# Patient Record
Sex: Male | Born: 1950
Health system: Southern US, Community
[De-identification: ages and names within clinical notes are randomized; demographics above are authoritative.]

## PROBLEM LIST (undated history)

## (undated) DIAGNOSIS — I639 Cerebral infarction, unspecified: Secondary | ICD-10-CM

## (undated) DIAGNOSIS — R351 Nocturia: Secondary | ICD-10-CM

## (undated) DIAGNOSIS — Z8719 Personal history of other diseases of the digestive system: Secondary | ICD-10-CM

## (undated) DIAGNOSIS — K264 Chronic or unspecified duodenal ulcer with hemorrhage: Secondary | ICD-10-CM

## (undated) DIAGNOSIS — E079 Disorder of thyroid, unspecified: Secondary | ICD-10-CM

## (undated) DIAGNOSIS — G4730 Sleep apnea, unspecified: Secondary | ICD-10-CM

## (undated) DIAGNOSIS — N189 Chronic kidney disease, unspecified: Secondary | ICD-10-CM

## (undated) DIAGNOSIS — Z860100 Personal history of colon polyps, unspecified: Secondary | ICD-10-CM

## (undated) DIAGNOSIS — R011 Cardiac murmur, unspecified: Secondary | ICD-10-CM

## (undated) DIAGNOSIS — M1990 Unspecified osteoarthritis, unspecified site: Secondary | ICD-10-CM

## (undated) DIAGNOSIS — K219 Gastro-esophageal reflux disease without esophagitis: Secondary | ICD-10-CM

## (undated) DIAGNOSIS — R0600 Dyspnea, unspecified: Secondary | ICD-10-CM

## (undated) DIAGNOSIS — Z9289 Personal history of other medical treatment: Secondary | ICD-10-CM

## (undated) DIAGNOSIS — R350 Frequency of micturition: Secondary | ICD-10-CM

## (undated) DIAGNOSIS — K922 Gastrointestinal hemorrhage, unspecified: Secondary | ICD-10-CM

## (undated) DIAGNOSIS — M549 Dorsalgia, unspecified: Secondary | ICD-10-CM

## (undated) DIAGNOSIS — Z87442 Personal history of urinary calculi: Secondary | ICD-10-CM

## (undated) DIAGNOSIS — D62 Acute posthemorrhagic anemia: Secondary | ICD-10-CM

## (undated) DIAGNOSIS — G8929 Other chronic pain: Secondary | ICD-10-CM

## (undated) DIAGNOSIS — R7989 Other specified abnormal findings of blood chemistry: Secondary | ICD-10-CM

## (undated) DIAGNOSIS — K921 Melena: Secondary | ICD-10-CM

## (undated) DIAGNOSIS — Z8616 Personal history of COVID-19: Secondary | ICD-10-CM

## (undated) DIAGNOSIS — R609 Edema, unspecified: Secondary | ICD-10-CM

## (undated) DIAGNOSIS — R269 Unspecified abnormalities of gait and mobility: Secondary | ICD-10-CM

## (undated) DIAGNOSIS — D638 Anemia in other chronic diseases classified elsewhere: Secondary | ICD-10-CM

## (undated) DIAGNOSIS — E785 Hyperlipidemia, unspecified: Secondary | ICD-10-CM

## (undated) DIAGNOSIS — I10 Essential (primary) hypertension: Secondary | ICD-10-CM

## (undated) DIAGNOSIS — I509 Heart failure, unspecified: Secondary | ICD-10-CM

## (undated) DIAGNOSIS — J1282 Pneumonia due to coronavirus disease 2019: Secondary | ICD-10-CM

## (undated) DIAGNOSIS — I259 Chronic ischemic heart disease, unspecified: Secondary | ICD-10-CM

## (undated) DIAGNOSIS — E669 Obesity, unspecified: Secondary | ICD-10-CM

## (undated) DIAGNOSIS — R600 Localized edema: Secondary | ICD-10-CM

## (undated) DIAGNOSIS — U071 COVID-19: Secondary | ICD-10-CM

## (undated) HISTORY — DX: COVID-19: U07.1

## (undated) HISTORY — PX: ESOPHAGOGASTRODUODENOSCOPY: SHX1529

## (undated) HISTORY — DX: Personal history of other medical treatment: Z92.89

## (undated) HISTORY — PX: OTHER SURGICAL HISTORY: SHX169

## (undated) HISTORY — DX: Obesity, unspecified: E66.9

## (undated) HISTORY — DX: Heart failure, unspecified: I50.9

## (undated) HISTORY — DX: Other specified abnormal findings of blood chemistry: R79.89

## (undated) HISTORY — DX: Chronic or unspecified duodenal ulcer with hemorrhage: K26.4

## (undated) HISTORY — DX: Acute posthemorrhagic anemia: D62

## (undated) HISTORY — PX: NEUROPLASTY / TRANSPOSITION MEDIAN NERVE AT CARPAL TUNNEL BILATERAL: SUR894

## (undated) HISTORY — DX: Melena: K92.1

## (undated) HISTORY — DX: Personal history of COVID-19: Z86.16

## (undated) HISTORY — PX: HEMORRHOID SURGERY: SHX153

## (undated) HISTORY — DX: Disorder of thyroid, unspecified: E07.9

## (undated) HISTORY — DX: Cerebral infarction, unspecified: I63.9

## (undated) HISTORY — DX: Chronic ischemic heart disease, unspecified: I25.9

## (undated) HISTORY — DX: Pneumonia due to coronavirus disease 2019: J12.82

## (undated) HISTORY — DX: Chronic kidney disease, unspecified: N18.9

## (undated) HISTORY — PX: COLONOSCOPY: SHX174

## (undated) HISTORY — DX: Anemia in other chronic diseases classified elsewhere: D63.8

## (undated) HISTORY — PX: NEPHRECTOMY TRANSPLANTED ORGAN: SUR880

## (undated) HISTORY — DX: Unspecified abnormalities of gait and mobility: R26.9

---

## 1999-09-16 ENCOUNTER — Encounter: Admission: RE | Admit: 1999-09-16 | Discharge: 1999-12-15 | Payer: Self-pay | Admitting: Anesthesiology

## 1999-12-16 ENCOUNTER — Ambulatory Visit (HOSPITAL_COMMUNITY): Admission: RE | Admit: 1999-12-16 | Discharge: 1999-12-16 | Payer: Self-pay | Admitting: Neurosurgery

## 2000-06-02 ENCOUNTER — Ambulatory Visit (HOSPITAL_COMMUNITY): Admission: RE | Admit: 2000-06-02 | Discharge: 2000-06-02 | Payer: Self-pay | Admitting: Neurosurgery

## 2000-08-23 ENCOUNTER — Inpatient Hospital Stay (HOSPITAL_COMMUNITY): Admission: RE | Admit: 2000-08-23 | Discharge: 2000-08-24 | Payer: Self-pay | Admitting: Neurosurgery

## 2000-10-27 ENCOUNTER — Encounter: Admission: RE | Admit: 2000-10-27 | Discharge: 2001-01-08 | Payer: Self-pay | Admitting: Neurosurgery

## 2001-04-23 ENCOUNTER — Inpatient Hospital Stay (HOSPITAL_COMMUNITY): Admission: RE | Admit: 2001-04-23 | Discharge: 2001-04-26 | Payer: Self-pay | Admitting: Neurosurgery

## 2006-01-19 ENCOUNTER — Emergency Department (HOSPITAL_COMMUNITY): Admission: EM | Admit: 2006-01-19 | Discharge: 2006-01-19 | Payer: Self-pay | Admitting: Emergency Medicine

## 2011-01-22 ENCOUNTER — Inpatient Hospital Stay (HOSPITAL_COMMUNITY)
Admission: EM | Admit: 2011-01-22 | Discharge: 2011-01-27 | DRG: 552 | Disposition: A | Discharge status: Home / Self Care | Payer: Medicare Other | Attending: Internal Medicine | Admitting: Internal Medicine

## 2011-01-22 ENCOUNTER — Encounter (HOSPITAL_COMMUNITY): Payer: Self-pay | Admitting: *Deleted

## 2011-01-22 DIAGNOSIS — E119 Type 2 diabetes mellitus without complications: Secondary | ICD-10-CM | POA: Diagnosis present

## 2011-01-22 DIAGNOSIS — S335XXA Sprain of ligaments of lumbar spine, initial encounter: Principal | ICD-10-CM | POA: Diagnosis present

## 2011-01-22 DIAGNOSIS — Z794 Long term (current) use of insulin: Secondary | ICD-10-CM

## 2011-01-22 DIAGNOSIS — T46905A Adverse effect of unspecified agents primarily affecting the cardiovascular system, initial encounter: Secondary | ICD-10-CM | POA: Diagnosis present

## 2011-01-22 DIAGNOSIS — Z888 Allergy status to other drugs, medicaments and biological substances status: Secondary | ICD-10-CM

## 2011-01-22 DIAGNOSIS — X58XXXA Exposure to other specified factors, initial encounter: Secondary | ICD-10-CM

## 2011-01-22 DIAGNOSIS — N179 Acute kidney failure, unspecified: Secondary | ICD-10-CM | POA: Diagnosis present

## 2011-01-22 DIAGNOSIS — Z23 Encounter for immunization: Secondary | ICD-10-CM

## 2011-01-22 DIAGNOSIS — F17200 Nicotine dependence, unspecified, uncomplicated: Secondary | ICD-10-CM | POA: Diagnosis present

## 2011-01-22 DIAGNOSIS — Z981 Arthrodesis status: Secondary | ICD-10-CM

## 2011-01-22 DIAGNOSIS — M545 Low back pain, unspecified: Secondary | ICD-10-CM | POA: Diagnosis present

## 2011-01-22 DIAGNOSIS — I10 Essential (primary) hypertension: Secondary | ICD-10-CM | POA: Diagnosis present

## 2011-01-22 DIAGNOSIS — N189 Chronic kidney disease, unspecified: Secondary | ICD-10-CM | POA: Diagnosis present

## 2011-01-22 DIAGNOSIS — E669 Obesity, unspecified: Secondary | ICD-10-CM | POA: Diagnosis present

## 2011-01-22 DIAGNOSIS — Z79899 Other long term (current) drug therapy: Secondary | ICD-10-CM

## 2011-01-22 DIAGNOSIS — E875 Hyperkalemia: Secondary | ICD-10-CM | POA: Diagnosis present

## 2011-01-22 DIAGNOSIS — M48061 Spinal stenosis, lumbar region without neurogenic claudication: Secondary | ICD-10-CM | POA: Diagnosis present

## 2011-01-22 DIAGNOSIS — M549 Dorsalgia, unspecified: Secondary | ICD-10-CM | POA: Diagnosis present

## 2011-01-22 DIAGNOSIS — G8929 Other chronic pain: Secondary | ICD-10-CM | POA: Diagnosis present

## 2011-01-22 DIAGNOSIS — M5380 Other specified dorsopathies, site unspecified: Secondary | ICD-10-CM | POA: Diagnosis present

## 2011-01-22 DIAGNOSIS — I129 Hypertensive chronic kidney disease with stage 1 through stage 4 chronic kidney disease, or unspecified chronic kidney disease: Secondary | ICD-10-CM | POA: Diagnosis present

## 2011-01-22 DIAGNOSIS — Z940 Kidney transplant status: Secondary | ICD-10-CM

## 2011-01-22 HISTORY — DX: Essential (primary) hypertension: I10

## 2011-01-22 LAB — BASIC METABOLIC PANEL
CO2: 20 mEq/L (ref 19–32)
Calcium: 9.3 mg/dL (ref 8.4–10.5)
Creatinine, Ser: 1.87 mg/dL — ABNORMAL HIGH (ref 0.50–1.35)
Glucose, Bld: 109 mg/dL — ABNORMAL HIGH (ref 70–99)

## 2011-01-22 LAB — CBC
Hemoglobin: 13.1 g/dL (ref 13.0–17.0)
MCH: 30.6 pg (ref 26.0–34.0)
MCV: 88.8 fL (ref 78.0–100.0)
RBC: 4.28 MIL/uL (ref 4.22–5.81)

## 2011-01-22 MED ORDER — DEXTROSE 50 % IV SOLN
25.0000 g | Freq: Once | INTRAVENOUS | Status: AC
  Administered 2011-01-23: 25 g via INTRAVENOUS
  Filled 2011-01-22: qty 50

## 2011-01-22 MED ORDER — HYDROMORPHONE HCL PF 1 MG/ML IJ SOLN
1.0000 mg | INTRAMUSCULAR | Status: DC | PRN
  Administered 2011-01-23 (×5): 1 mg via INTRAVENOUS
  Filled 2011-01-22 (×8): qty 1

## 2011-01-22 MED ORDER — SODIUM CHLORIDE 0.9 % IV SOLN
Freq: Once | INTRAVENOUS | Status: AC
  Administered 2011-01-22: via INTRAVENOUS

## 2011-01-22 MED ORDER — DEXTROSE 5 % IV SOLN
INTRAVENOUS | Status: DC
  Administered 2011-01-23: 04:00:00 via INTRAVENOUS
  Filled 2011-01-22 (×5): qty 150

## 2011-01-22 MED ORDER — HYDROMORPHONE HCL PF 1 MG/ML IJ SOLN
1.0000 mg | Freq: Once | INTRAMUSCULAR | Status: AC
  Administered 2011-01-22: 1 mg via INTRAVENOUS
  Filled 2011-01-22: qty 1

## 2011-01-22 MED ORDER — DIAZEPAM 5 MG/ML IJ SOLN: 5.0000 mg | Freq: Four times a day (QID) | INTRAMUSCULAR | Status: DC | PRN

## 2011-01-22 MED ORDER — ONDANSETRON 4 MG PO TBDP
ORAL_TABLET | ORAL | Status: AC
  Administered 2011-01-22: 20:00:00
  Filled 2011-01-22: qty 1

## 2011-01-22 MED ORDER — SODIUM CHLORIDE 0.9 % IV SOLN
1.0000 g | Freq: Once | INTRAVENOUS | Status: AC
  Administered 2011-01-23: 1 g via INTRAVENOUS
  Filled 2011-01-22: qty 10

## 2011-01-22 MED ORDER — ONDANSETRON HCL 4 MG/2ML IJ SOLN: 4.0000 mg | Freq: Four times a day (QID) | INTRAMUSCULAR | Status: DC | PRN

## 2011-01-22 MED ORDER — INSULIN REGULAR HUMAN 100 UNIT/ML IJ SOLN: 10.0000 [IU] | Freq: Once | INTRAMUSCULAR | Status: DC

## 2011-01-22 MED ORDER — OXYCODONE-ACETAMINOPHEN 5-325 MG PO TABS
1.0000 | ORAL_TABLET | Freq: Four times a day (QID) | ORAL | Status: DC | PRN
  Administered 2011-01-25 – 2011-01-26 (×5): 1 via ORAL
  Administered 2011-01-26: 2 via ORAL
  Administered 2011-01-27 (×2): 1 via ORAL
  Filled 2011-01-22 (×6): qty 1
  Filled 2011-01-22: qty 2
  Filled 2011-01-22: qty 1

## 2011-01-22 MED ORDER — INSULIN ASPART 100 UNIT/ML IV SOLN
10.0000 [IU] | Freq: Once | INTRAVENOUS | Status: DC
  Filled 2011-01-22: qty 0.1

## 2011-01-22 NOTE — ED Notes (Signed)
Pt very nauseated from pain, actively vomiting

## 2011-01-22 NOTE — ED Notes (Signed)
He has pain in his lower back since Tuesday.  He may have injured his back cutting wood.  He has chronic back pain.  The pain is worse

## 2011-01-22 NOTE — ED Notes (Signed)
Patient states pain 0/10. Walked from stretcher to bed with minimal assistance. Gait steady

## 2011-01-22 NOTE — ED Provider Notes (Signed)
History     CSN: [id number]  Arrival date & time 01/22/11  [id number]   First MD Initiated Contact with Patient 01/22/11 2053      Chief Complaint  Patient presents with  . Back Pain    (Consider location/radiation/quality/duration/timing/severity/associated sxs/prior treatment) HPI Comments: Patient with hx L-spine surgery states that he was splitting wood 4 days ago and strained his back bending over to pick up wood.  The next day, he twisted his back and heard and felt a pop in his low back.  Patient has since had increased low back pain with radiation into both legs.  States he also is having weakness in both legs.  States he has not had pain that has been this bad ever, not even before his prior spinal surgery.  Patient has been taking hydrocodone-APAP 10-325s without any improvement.  Today patient began vomiting from the pain and has been unable to tolerate any PO. Pain is worse with walking, bending, and certain positions.  It is better with laying flat, which is what patient has been doing for several days.  He has also started walking with a cane, which he has not needed previously.  Denies numbness or tingling of the extremities.  Denies fever, abdominal pain, loss of control of bowel or bladder.    Patient is a 61 y.o. male presenting with back pain. The history is provided by the patient and the spouse.  Back Pain  This is a recurrent problem. The current episode started more than 2 days ago. The problem occurs constantly. The pain is associated with a recent injury, lifting heavy objects and twisting. The pain is present in the lumbar spine. The quality of the pain is described as stabbing and shooting. Radiates to: bilateral lower extremities. The symptoms are aggravated by bending, twisting and certain positions. Associated symptoms include leg pain and weakness. Pertinent negatives include no chest pain, no fever, no abdominal pain, no bowel incontinence, no perianal numbness, no  bladder incontinence, no paresthesias, no paresis and no tingling. He has tried analgesics for the symptoms. The treatment provided no relief. Risk factors include obesity and lack of exercise (Prior injury/L-spine surgery).    Past Medical History  Diagnosis Date  . Diabetes mellitus   . Hypertension     History reviewed. No pertinent past surgical history.  History reviewed. No pertinent family history.  History  Substance Use Topics  . Smoking status: Current Everyday Smoker  . Smokeless tobacco: Not on file  . Alcohol Use: Yes      Review of Systems  Constitutional: Negative for fever.  Respiratory: Negative for shortness of breath.   Cardiovascular: Negative for chest pain.  Gastrointestinal: Positive for nausea and vomiting. Negative for abdominal pain, diarrhea, constipation and bowel incontinence.  Genitourinary: Negative for bladder incontinence.  Musculoskeletal: Positive for back pain.  Neurological: Positive for weakness. Negative for tingling and paresthesias.  All other systems reviewed and are negative.    Allergies  Valstar  Home Medications   Current Outpatient Rx  Name Route Sig Dispense Refill  . HYDROCODONE-ACETAMINOPHEN 10-325 MG PO TABS Oral Take 1 tablet by mouth every 5 (five) hours as needed. For pain    . INSULIN GLARGINE 100 UNIT/ML [place] SOLN Subcutaneous Inject 40 Units into the skin at bedtime.    [name] LISINOPRIL 5 MG PO TABS Oral Take 2.5 mg by mouth daily.    [name] LOVASTATIN 20 MG PO TABS Oral Take 20 mg by mouth daily.    [name]  METFORMIN HCL 1000 MG PO TABS Oral Take 1,000 mg by mouth 2 (two) times daily with a meal.    . NAPROXEN SODIUM 220 MG PO TABS Oral Take 220 mg by mouth daily as needed. For fever    . PROMETHAZINE HCL 25 MG PO TABS Oral Take 25 mg by mouth every 6 (six) hours as needed. For nausea      BP 182/68  Pulse 62  Temp(Src) 98.1 F (36.7 C) (Oral)  Resp 18  SpO2 97%  Physical Exam  Nursing note and vitals  reviewed. Constitutional: He is oriented to person, place, and time. He appears well-developed and well-nourished.  HENT:  Head: Normocephalic and atraumatic.  Neck: Neck supple.  Cardiovascular: Normal rate, regular rhythm and normal heart sounds.   Pulmonary/Chest: Breath sounds normal. No respiratory distress. He has no wheezes. He has no rales. He exhibits no tenderness.  Abdominal: Soft. Bowel sounds are normal. He exhibits no distension and no mass. There is no tenderness. There is no rebound and no guarding.  Musculoskeletal:       Arms:      Straight leg raise is positive.  Neurological: He is alert and oriented to person, place, and time. No sensory deficit.       Strength 5/5 right leg, 4/5 left leg.  Sensation intact.  Distal pulses intact.  Patellar reflexes intact bilaterally.     ED Course  Procedures (including critical care time)  Labs Reviewed  CBC - Abnormal; Notable for the following:    WBC 12.2 (*)    HCT 38.0 (*)    All other components within normal limits  BASIC METABOLIC PANEL - Abnormal; Notable for the following:    Potassium 6.9 (*)    Glucose, Bld 109 (*)    BUN 30 (*)    Creatinine, Ser 1.87 (*)    GFR calc non Af Amer 38 (*)    GFR calc Af Amer 44 (*)    All other components within normal limits  GLUCOSE, CAPILLARY   No results found.  9:26 PM Patient seen and examined, discussed with Dr Lita Mains.  Patient placed on back pain protocol overnight with MRI in the morning.    11:50 PM Patient found to be hyperkalemic at 6.9.  I have discussed this with Dr Lita Mains who has reviewed the EKG and has seen the patient.  Patient is comfortable and sleeping following pain medication.  Plan per Dr Lita Mains is to treat hyperkalemia with calcium, insulin and glucose, and sodium bicarbonate and to recheck the potassium.  If the potassium level is not under 6 when rechecked, patient will need to be admitted to the hospital.    No diagnosis found.    MDM   Patient with exacerbation of low back pain in CDU on back pain protocol with MR planned for the morning.  Patient incidentally found to be hyperkalemic.  We are treating the hyperkalemia and rechecking potassium.  If potassium is under 6, patient will continue on protocol.  If it is higher, patient to be admitted.  Dr Stark Jock is aware of patient and assumes care of him at change of shift.     Otis Brace, [place] 01/23/11 0008   Date: 01/23/2011  Rate: 61  Rhythm: normal sinus rhythm  QRS Axis: normal  Intervals: normal  ST/T Wave abnormalities: peaked T wave in V3 and V4  Conduction Disutrbances:none  Narrative Interpretation:   Old EKG Reviewed: none available    Otis Brace,  [place] 01/23/11 0012

## 2011-01-23 ENCOUNTER — Inpatient Hospital Stay (HOSPITAL_COMMUNITY): Payer: Medicare Other

## 2011-01-23 DIAGNOSIS — E119 Type 2 diabetes mellitus without complications: Secondary | ICD-10-CM

## 2011-01-23 DIAGNOSIS — M545 Low back pain, unspecified: Secondary | ICD-10-CM | POA: Diagnosis present

## 2011-01-23 DIAGNOSIS — E875 Hyperkalemia: Secondary | ICD-10-CM

## 2011-01-23 HISTORY — DX: Type 2 diabetes mellitus without complications: E11.9

## 2011-01-23 HISTORY — DX: Hyperkalemia: E87.5

## 2011-01-23 LAB — GLUCOSE, CAPILLARY
Glucose-Capillary: 91 mg/dL (ref 70–99)
Glucose-Capillary: 105 mg/dL — ABNORMAL HIGH (ref 70–99)

## 2011-01-23 LAB — HEMOGLOBIN A1C: Mean Plasma Glucose: 111 mg/dL (ref <117)

## 2011-01-23 LAB — BASIC METABOLIC PANEL
BUN: 37 mg/dL — ABNORMAL HIGH (ref 6–23)
CO2: 24 mEq/L (ref 19–32)
Calcium: 8.9 mg/dL (ref 8.4–10.5)
Creatinine, Ser: 2.09 mg/dL — ABNORMAL HIGH (ref 0.50–1.35)
Glucose, Bld: 175 mg/dL — ABNORMAL HIGH (ref 70–99)

## 2011-01-23 LAB — URINE MICROSCOPIC-ADD ON

## 2011-01-23 LAB — URINALYSIS, ROUTINE W REFLEX MICROSCOPIC
Bilirubin Urine: NEGATIVE
Ketones, ur: NEGATIVE mg/dL
Nitrite: NEGATIVE
Urobilinogen, UA: 0.2 mg/dL (ref 0.0–1.0)
pH: 5 (ref 5.0–8.0)

## 2011-01-23 LAB — POTASSIUM: Potassium: 5.3 mEq/L — ABNORMAL HIGH (ref 3.5–5.1)

## 2011-01-23 LAB — CK: Total CK: 58 U/L (ref 7–232)

## 2011-01-23 MED ORDER — SODIUM POLYSTYRENE SULFONATE 15 GM/60ML PO SUSP: 15.0000 g | Freq: Once | ORAL | Status: DC

## 2011-01-23 MED ORDER — HYDRALAZINE HCL 20 MG/ML IJ SOLN
10.0000 mg | Freq: Four times a day (QID) | INTRAMUSCULAR | Status: DC | PRN
  Administered 2011-01-24 – 2011-01-25 (×2): 10 mg via INTRAVENOUS
  Filled 2011-01-23 (×2): qty 1

## 2011-01-23 MED ORDER — ACETAMINOPHEN 650 MG RE SUPP: 650.0000 mg | Freq: Four times a day (QID) | RECTAL | Status: DC | PRN

## 2011-01-23 MED ORDER — ACETAMINOPHEN 325 MG PO TABS: 650.0000 mg | ORAL_TABLET | Freq: Four times a day (QID) | ORAL | Status: DC | PRN

## 2011-01-23 MED ORDER — SODIUM CHLORIDE 0.9 % IV SOLN
INTRAVENOUS | Status: DC
  Administered 2011-01-23 – 2011-01-25 (×3): via INTRAVENOUS

## 2011-01-23 MED ORDER — INSULIN ASPART 100 UNIT/ML ~~LOC~~ SOLN: 0.0000 [IU] | Freq: Every day | SUBCUTANEOUS | Status: DC

## 2011-01-23 MED ORDER — SODIUM POLYSTYRENE SULFONATE 15 GM/60ML PO SUSP
30.0000 g | Freq: Once | ORAL | Status: AC
  Administered 2011-01-23: 30 g via ORAL
  Filled 2011-01-23: qty 120

## 2011-01-23 MED ORDER — INSULIN ASPART 100 UNIT/ML ~~LOC~~ SOLN
SUBCUTANEOUS | Status: AC
  Administered 2011-01-23: 10 [IU]
  Filled 2011-01-23: qty 10

## 2011-01-23 MED ORDER — INFLUENZA VIRUS VACC SPLIT PF IM SUSP
0.5000 mL | INTRAMUSCULAR | Status: AC
  Filled 2011-01-23: qty 0.5

## 2011-01-23 MED ORDER — SODIUM BICARBONATE 8.4 % IV SOLN
INTRAVENOUS | Status: AC
Start: 2011-01-23 — End: 2011-01-23
  Administered 2011-01-23: 50 meq
  Filled 2011-01-23: qty 50

## 2011-01-23 MED ORDER — ONDANSETRON HCL 4 MG PO TABS: 4.0000 mg | ORAL_TABLET | Freq: Four times a day (QID) | ORAL | Status: DC | PRN

## 2011-01-23 MED ORDER — INSULIN GLARGINE 100 UNIT/ML ~~LOC~~ SOLN
40.0000 [IU] | Freq: Every day | SUBCUTANEOUS | Status: DC
  Administered 2011-01-23 – 2011-01-26 (×4): 40 [IU] via SUBCUTANEOUS
  Filled 2011-01-23 (×2): qty 3

## 2011-01-23 MED ORDER — SODIUM CHLORIDE 0.9 % IV SOLN: INTRAVENOUS | Status: AC

## 2011-01-23 MED ORDER — KETOROLAC TROMETHAMINE 30 MG/ML IJ SOLN
30.0000 mg | Freq: Once | INTRAMUSCULAR | Status: AC
  Administered 2011-01-23: 30 mg via INTRAVENOUS
  Filled 2011-01-23: qty 1

## 2011-01-23 MED ORDER — HYDROMORPHONE HCL PF 1 MG/ML IJ SOLN
0.5000 mg | INTRAMUSCULAR | Status: DC | PRN
  Administered 2011-01-24 (×3): 1 mg via INTRAVENOUS

## 2011-01-23 MED ORDER — ZOLPIDEM TARTRATE 5 MG PO TABS: 5.0000 mg | ORAL_TABLET | Freq: Every evening | ORAL | Status: DC | PRN

## 2011-01-23 MED ORDER — OXYCODONE HCL 5 MG PO TABS
5.0000 mg | ORAL_TABLET | ORAL | Status: DC | PRN
  Administered 2011-01-24 – 2011-01-25 (×2): 5 mg via ORAL
  Filled 2011-01-23 (×2): qty 1

## 2011-01-23 MED ORDER — ONDANSETRON HCL 4 MG/2ML IJ SOLN: 4.0000 mg | Freq: Four times a day (QID) | INTRAMUSCULAR | Status: DC | PRN

## 2011-01-23 MED ORDER — ENOXAPARIN SODIUM 40 MG/0.4ML ~~LOC~~ SOLN
40.0000 mg | SUBCUTANEOUS | Status: DC
  Administered 2011-01-23 – 2011-01-27 (×5): 40 mg via SUBCUTANEOUS
  Filled 2011-01-23 (×5): qty 0.4

## 2011-01-23 MED ORDER — CALCIUM GLUCONATE 10 % IV SOLN
INTRAVENOUS | Status: AC
Start: 2011-01-23 — End: 2011-01-23
  Filled 2011-01-23: qty 10

## 2011-01-23 MED ORDER — INSULIN ASPART 100 UNIT/ML ~~LOC~~ SOLN
0.0000 [IU] | Freq: Three times a day (TID) | SUBCUTANEOUS | Status: DC
  Filled 2011-01-23 (×2): qty 3

## 2011-01-23 MED ORDER — SODIUM CHLORIDE 0.9 % IV SOLN
INTRAVENOUS | Status: AC
  Filled 2011-01-23: qty 100

## 2011-01-23 MED ORDER — PROMETHAZINE HCL 25 MG PO TABS: 25.0000 mg | ORAL_TABLET | Freq: Four times a day (QID) | ORAL | Status: DC | PRN

## 2011-01-23 MED ORDER — PNEUMOCOCCAL VAC POLYVALENT 25 MCG/0.5ML IJ INJ
0.5000 mL | INJECTION | INTRAMUSCULAR | Status: AC
  Filled 2011-01-23: qty 0.5

## 2011-01-23 NOTE — H&P (Addendum)
DATE OF ADMISSION:  01/23/2011  PCP:  Dr. Rex Kras    Chief Complaint:  Low Back Pain   HPI: Jon [name] is an 61 y.o. male who was splitting wood 5 days ago and suffered increased 10/10 low back pain which worsened when he twisted his back while using the toilet.  He had increased pain and had difficulty moving.  He had contacted his neurosurgeon who had performed his lumbar fusion and was in the process of getting approval by his insurance to get an MRI, but he had increased pain on 01/12 and was advised to come to the ED for pain control and further evaluation.  Patient was seen in the ED and placed on the Back Pain Protocol and was to have an MRI in the AM, but was found to have an elevated potassium level of 6.9 when his baseline labs were drawn, so a repeat potassium level was performed and found to be 7.1.  Patient was started on therapy by the EDP, and referred for medical admission.  Patient denies taking any poatssium supplements, bu the does take an ace inhibitor and is on lisinopril 5mg  and has been on this for many years.  He has a history of type 2 Diabetes.     Past Medical History  Diagnosis Date  . Diabetes mellitus   . Hypertension     Past Surgical History:       Lumbar Fusion L 3,4,5       Arthoscopic Surgery Left Knee       ORIF Right Femur   Medications:  HOME MEDS: Prior to Admission medications   Medication Sig Start Date End Date Taking? Authorizing Provider  HYDROcodone-acetaminophen (NORCO) 10-325 MG per tablet Take 1 tablet by mouth every 5 (five) hours as needed. For pain   Yes Historical Provider, MD  insulin glargine (LANTUS) 100 UNIT/ML injection Inject 40 Units into the skin at bedtime.   Yes Historical Provider, MD  lisinopril (PRINIVIL,ZESTRIL) 5 MG tablet Take 2.5 mg by mouth daily.   Yes Historical Provider, MD  lovastatin (MEVACOR) 20 MG tablet Take 20 mg by mouth daily.   Yes Historical Provider, MD  metFORMIN (GLUCOPHAGE) 1000 MG tablet Take  1,000 mg by mouth 2 (two) times daily with a meal.   Yes Historical Provider, MD  naproxen sodium (ANAPROX) 220 MG tablet Take 220 mg by mouth daily as needed. For fever   Yes Historical Provider, MD  promethazine (PHENERGAN) 25 MG tablet Take 25 mg by mouth every 6 (six) hours as needed. For nausea   Yes Historical Provider, MD    Allergies:  Allergies  Allergen Reactions  . Valstar Nausea And Vomiting    Social History:   reports that he has been smoking.  He does not have any smokeless tobacco history on file. He reports that he drinks alcohol. His drug history not on file.  Smokes 1ppd, Rare ETOH, No Illicit Drug Usage.    Family History: History reviewed. No pertinent family history.  Review of Systems:  The patient denies anorexia, fever, weight loss, vision loss, decreased hearing, hoarseness, chest pain, syncope, dyspnea on exertion, peripheral edema, balance deficits, hemoptysis, abdominal pain, melena, hematochezia, severe indigestion/heartburn, hematuria, incontinence, genital sores, muscle weakness, suspicious skin lesions, transient blindness, difficulty walking, depression, unusual weight change, abnormal bleeding, enlarged lymph nodes, angioedema, and breast masses.   Physical Exam:  GEN:  Pleasant 61 year old Caucasian male examined  and in discomfort but no acute distress; cooperative with  exam Filed Vitals:   01/22/11 1901 01/22/11 2130 01/23/11 0145  BP: 182/68 153/82 123/67  Pulse: 62    Temp: 98.1 F (36.7 C) 98.3 F (36.8 C)   TempSrc: Oral Oral   Resp: 18 18 16   SpO2: 97% 95% 96%   Blood pressure 123/67, pulse 62, temperature 98.3 F (36.8 C), temperature source Oral, resp. rate 16, SpO2 96.00%. PSYCH: He is alert and oriented x4; does not appear anxious does not appear depressed; affect is normal HEENT: Normocephalic and Atraumatic, Mucous membranes pink; PERRLA; EOM intact; Fundi:  Benign;  No scleral icterus, Nares: Patent, Oropharynx: Clear, Edentulous   With Dentures, Neck:  FROM, no cervical lymphadenopathy nor thyromegaly or carotid bruit; no JVD; Breasts:: Not examined CHEST WALL: No tenderness CHEST: Normal respiration, clear to auscultation bilaterally HEART: Regular rate and rhythm; no murmurs rubs or gallops BACK: No kyphosis or scoliosis; no CVA tenderness ABDOMEN: Positive Bowel Sounds, Obese, soft non-tender; no masses, no organomegaly. Rectal Exam: Not done EXTREMITIES: No cyanosis, clubbing or edema; no ulcerations. Genitalia: not examined PULSES: 2+ and symmetric SKIN: Normal hydration no rash or ulceration CNS: Cranial nerves 2-12 grossly intact no focal neurologic deficit   Labs & Imaging Results for orders placed during the hospital encounter of 01/22/11 (from the past 48 hour(s))  CBC     Status: Abnormal   Collection Time   01/22/11  9:29 PM      Component Value Range Comment   WBC 12.2 (*) 4.0 - 10.5 (K/uL)    RBC 4.28  4.22 - 5.81 (MIL/uL)    Hemoglobin 13.1  13.0 - 17.0 (g/dL)    HCT 38.0 (*) 39.0 - 52.0 (%)    MCV 88.8  78.0 - 100.0 (fL)    MCH 30.6  26.0 - 34.0 (pg)    MCHC 34.5  30.0 - 36.0 (g/dL)    RDW 13.1  11.5 - 15.5 (%)    Platelets 166  150 - 400 (K/uL)   BASIC METABOLIC PANEL     Status: Abnormal   Collection Time   01/22/11  9:29 PM      Component Value Range Comment   Sodium 137  135 - 145 (mEq/L)    Potassium 6.9 (*) 3.5 - 5.1 (mEq/L)    Chloride 110  96 - 112 (mEq/L)    CO2 20  19 - 32 (mEq/L)    Glucose, Bld 109 (*) 70 - 99 (mg/dL)    BUN 30 (*) 6 - 23 (mg/dL)    Creatinine, Ser 1.87 (*) 0.50 - 1.35 (mg/dL)    Calcium 9.3  8.4 - 10.5 (mg/dL)    GFR calc non Af Amer 38 (*) >90 (mL/min)    GFR calc Af Amer 44 (*) >90 (mL/min)   GLUCOSE, CAPILLARY     Status: Normal   Collection Time   01/22/11 11:37 PM      Component Value Range Comment   Glucose-Capillary 95  70 - 99 (mg/dL)   POTASSIUM     Status: Abnormal   Collection Time   01/22/11 11:50 PM      Component Value Range Comment    Potassium 7.2 (*) 3.5 - 5.1 (mEq/L)    No results found.    Assessment:    1. Hyperkalemia 2. ARF 3. Low Back Strain  4. DDD 5. DM 2 6. Obesity   Plan:         Administer Kayexalate and IVFs , and recheck K+ level in 6 hours, and  redose for K+>5.3.  Check CPK levels because the etiology may be due to rhabdomyolysis or ace inhibitor therapy, thus IVFs have been ordered for hydration.  PRN Pain Medication and Muscle relaxant therapy has been ordered as well and an MRI study of the Lumbar spine had been ordered.  The home medications have been reconciled and his Glucophage, Lisinopril and Mevacor will be held for now,  SSI coverage has been ordered PRN elevated blood sugars.  And Lovenox has been ordered for DVT prophlaxis.    Other plans as per orders.    CODE STATUS:      FULL CODE        Anastasia Tompson C 01/23/2011, 3:31 AM

## 2011-01-23 NOTE — Progress Notes (Signed)
Repeat BP 160/101. Paged NP on call. New orders will be entered by the NP. Venita Sheffield RN CCM

## 2011-01-23 NOTE — ED Notes (Signed)
Pt NSR, NAD, resp e/u, AOx4 at time of admission.

## 2011-01-23 NOTE — Progress Notes (Signed)
Subjective: Patient is currently feeling well.  Still has discomfort from his lower back which he attributes to getting after trying to split wood yesterday.  Denies taking any herbal supplements at home.  Denies taking any other medications other than what he has listed.  No acute issues overnight.  Objective: Filed Vitals:   01/23/11 0145 01/23/11 0425 01/23/11 0500 01/23/11 0630  BP: 123/67 150/70 168/105 160/101  Pulse:  72 68   Temp:   98.1 F (36.7 C)   TempSrc:      Resp: 16 20 18    Height:   5\' 11"  (1.803 m)   Weight:   101.1 kg (222 lb 14.2 oz)   SpO2: 96% 97% 96%    Weight change:   Intake/Output Summary (Last 24 hours) at 01/23/11 0855 Last data filed at 01/23/11 0800  Gross per 24 hour  Intake    480 ml  Output    425 ml  Net     55 ml    General: Alert, awake, oriented x3, in no acute distress.  HEENT: No bruits, no goiter.  Heart: Regular rate and rhythm, without murmurs, rubs, gallops.  Lungs: CTA BL Musculoskeletal:  Some discomfort at lower paraspinal muscles Abdomen: Soft, nontender, nondistended, positive bowel sounds.  Neuro: Grossly intact, nonfocal.   Lab Results:  Basename 01/23/11 0313 01/22/11 2350 01/22/11 2129  NA -- -- 137  K 5.3* 7.2* --  CL -- -- 110  CO2 -- -- 20  GLUCOSE -- -- 109*  BUN -- -- 30*  CREATININE -- -- 1.87*  CALCIUM -- -- 9.3  MG -- -- --  PHOS -- -- --   No results found for this basename: AST:2,ALT:2,ALKPHOS:2,BILITOT:2,PROT:2,ALBUMIN:2 in the last 72 hours No results found for this basename: LIPASE:2,AMYLASE:2 in the last 72 hours  Basename 01/22/11 2129  WBC 12.2*  NEUTROABS --  HGB 13.1  HCT 38.0*  MCV 88.8  PLT 166    Basename 01/23/11 0313  CKTOTAL 58  CKMB --  CKMBINDEX --  TROPONINI --   No components found with this basename: POCBNP:3 No results found for this basename: DDIMER:2 in the last 72 hours No results found for this basename: HGBA1C:2 in the last 72 hours No results found for this  basename: CHOL:2,HDL:2,LDLCALC:2,TRIG:2,CHOLHDL:2,LDLDIRECT:2 in the last 72 hours No results found for this basename: TSH,T4TOTAL,FREET3,T3FREE,THYROIDAB in the last 72 hours No results found for this basename: VITAMINB12:2,FOLATE:2,FERRITIN:2,TIBC:2,IRON:2,RETICCTPCT:2 in the last 72 hours  Micro Results: No results found for this or any previous visit (from the past 240 hour(s)).  Studies/Results: No results found.  Medications: I have reviewed the patient's current medications.  Hyperkalemia:  CK was within normal limits.  Likely due to side effect from lisinopril.  At this point holding lisinopril.  Will plan on continuing IVF's.  Recheck BMP today.  Pt has already had kayexalate and was administered at ~ 3 am.  With insulin administration given history of diabetes and discontinuation of lisinopril expect K level to decrease.   Lower back pain:  Pt to obtain an MRI given surgical history.  Should patient MRI come back negative will plan on treating with po pain medication and muscle relaxer and having pt f/u with PCP.  Other considered diagnosis given history is lower back muscle spasm.  Diabetes:  Pt is currently on SSI and Lantus.  Last checked creatinine was 1.87 so patient's metformin should be held on discharge. Blood sugars have ranged from 109-91 on current drug regimen.  Acute Kidney Injury:  Likely related to prerenal cause.  Patient has received IVF's will plan on rechecking creatinine today.  Should creatinine continue to rise despite IVF rehydration would consider further work-up and possibly a nephro consult.     LOS: 1 day   Velvet Bathe M.D.  [facility] 01/23/2011, 8:55 AM

## 2011-01-23 NOTE — ED Notes (Signed)
Dr Stark Jock notified of critical potassium level

## 2011-01-23 NOTE — ED Notes (Signed)
Dr Stark Jock at bedside updating patient/family on plan of care

## 2011-01-23 NOTE — ED Provider Notes (Signed)
Medical screening examination/treatment/procedure(s) were conducted as a shared visit with non-physician practitioner(s) and myself.  I personally evaluated the patient during the encounter  I examined pt. No focal weakness or new numbness in either leg. No urinary retention or incontenance. C/o lower back radiating down both legs that is now significantly improved. No urgent need for neurosurgery. Will get MRI in Am and dispo accordingly   Julianne Rice, MD 01/23/11 2333

## 2011-01-23 NOTE — ED Provider Notes (Signed)
Care assumed from Dr. Lita Mains.  I agree with his note, assessment, and plan.  He was put on the back pain protocol, but was found to have a potassium level of 6.9.  This was repeated and was 7.1.  He was given insulin and glucose, calcium gluconate.  I have consulted Dr. Arnoldo Morale from [facility] who agrees this patient requires admission for workup and treatment of the hyperkalemia.  I have since ordered kayexalate as well.  Veryl Speak, MD 01/23/11 [phone]

## 2011-01-24 DIAGNOSIS — Z940 Kidney transplant status: Secondary | ICD-10-CM

## 2011-01-24 HISTORY — DX: Kidney transplant status: Z94.0

## 2011-01-24 LAB — GLUCOSE, CAPILLARY
Glucose-Capillary: 86 mg/dL (ref 70–99)
Glucose-Capillary: 68 mg/dL — ABNORMAL LOW (ref 70–99)
Glucose-Capillary: 85 mg/dL (ref 70–99)
Glucose-Capillary: 119 mg/dL — ABNORMAL HIGH (ref 70–99)

## 2011-01-24 LAB — BASIC METABOLIC PANEL
Chloride: 107 mEq/L (ref 96–112)
Creatinine, Ser: 2.12 mg/dL — ABNORMAL HIGH (ref 0.50–1.35)
GFR calc Af Amer: 38 mL/min — ABNORMAL LOW (ref >90)
Potassium: 5.4 mEq/L — ABNORMAL HIGH (ref 3.5–5.1)

## 2011-01-24 LAB — CBC
HCT: 31.9 % — ABNORMAL LOW (ref 39.0–52.0)
Hemoglobin: 10.8 g/dL — ABNORMAL LOW (ref 13.0–17.0)
RDW: 13.1 % (ref 11.5–15.5)
WBC: 7.3 10*3/uL (ref 4.0–10.5)

## 2011-01-24 LAB — CREATININE, URINE, RANDOM: Creatinine, Urine: 71.97 mg/dL

## 2011-01-24 LAB — SODIUM, URINE, RANDOM: Sodium, Ur: 71 mEq/L

## 2011-01-24 MED ORDER — SODIUM POLYSTYRENE SULFONATE 15 GM/60ML PO SUSP
15.0000 g | Freq: Two times a day (BID) | ORAL | Status: DC
  Administered 2011-01-24 (×2): 15 g via ORAL
  Filled 2011-01-24 (×4): qty 60

## 2011-01-24 MED ORDER — SODIUM CHLORIDE 0.9 % IV BOLUS (SEPSIS)
1000.0000 mL | Freq: Once | INTRAVENOUS | Status: AC
  Administered 2011-01-24: 1000 mL via INTRAVENOUS

## 2011-01-24 MED ORDER — CYCLOBENZAPRINE HCL 5 MG PO TABS
5.0000 mg | ORAL_TABLET | Freq: Three times a day (TID) | ORAL | Status: DC
  Administered 2011-01-24 – 2011-01-27 (×9): 5 mg via ORAL
  Filled 2011-01-24 (×13): qty 1

## 2011-01-24 NOTE — Progress Notes (Signed)
01/24/2011 [name], Jon Case Management Note [phone]  Utilization review completed.

## 2011-01-24 NOTE — Progress Notes (Signed)
Subjective: Pt denies any acute issues overnight.  Still has discomfort located at his right lower back.  Objective: Filed Vitals:   01/23/11 0630 01/23/11 1413 01/23/11 2100 01/24/11 0500  BP: 160/101 155/85 157/84 168/91  Pulse:  76 62 58  Temp:  98.6 F (37 C) 98.4 F (36.9 C) 98 F (36.7 C)  TempSrc:  Oral    Resp:  18 20 18   Height:      Weight:      SpO2:  94% 96% 95%   Weight change:   Intake/Output Summary (Last 24 hours) at 01/24/11 1249 Last data filed at 01/24/11 0800  Gross per 24 hour  Intake   2440 ml  Output   1125 ml  Net   1315 ml    General: Alert, awake, oriented x3, in no acute distress.  HEENT: No bruits, no goiter.  Heart: Regular rate and rhythm, without murmurs, rubs, gallops.  Lungs: CTA BL Abdomen: Soft, nontender, nondistended, positive bowel sounds.  Neuro: Grossly intact, nonfocal. Musculoskeletal:  Discomfort with palpation over Right paraspinal muscles.   Lab Results:  Basename 01/24/11 0042 01/23/11 1300  NA 138 140  K 5.4* 5.1  CL 107 107  CO2 25 24  GLUCOSE 70 175*  BUN 38* 37*  CREATININE 2.12* 2.09*  CALCIUM 8.4 8.9  MG -- --  PHOS -- --   No results found for this basename: AST:2,ALT:2,ALKPHOS:2,BILITOT:2,PROT:2,ALBUMIN:2 in the last 72 hours No results found for this basename: LIPASE:2,AMYLASE:2 in the last 72 hours  Basename 01/24/11 0042 01/22/11 2129  WBC 7.3 12.2*  NEUTROABS -- --  HGB 10.8* 13.1  HCT 31.9* 38.0*  MCV 89.4 88.8  PLT 123* 166    Basename 01/23/11 0313  CKTOTAL 58  CKMB --  CKMBINDEX --  TROPONINI --   No components found with this basename: POCBNP:3 No results found for this basename: DDIMER:2 in the last 72 hours  Basename 01/23/11 0600  HGBA1C 5.5   No results found for this basename: CHOL:2,HDL:2,LDLCALC:2,TRIG:2,CHOLHDL:2,LDLDIRECT:2 in the last 72 hours No results found for this basename: TSH,T4TOTAL,FREET3,T3FREE,THYROIDAB in the last 72 hours No results found for this  basename: VITAMINB12:2,FOLATE:2,FERRITIN:2,TIBC:2,IRON:2,RETICCTPCT:2 in the last 72 hours  Micro Results: No results found for this or any previous visit (from the past 240 hour(s)).  Studies/Results: Mr Lumbar Spine Wo Contrast  01/23/2011  *RADIOLOGY REPORT*  Clinical Data: 61 year old male with acute increased back pain. Prior back surgery.  MRI LUMBAR SPINE WITHOUT CONTRAST  Technique:  Multiplanar and multiecho pulse sequences of the lumbar spine were obtained without intravenous contrast.  Comparison: [facility] lumbar radiographs 01/23/2009.  Findings: The patient is status post L4-L5 and L5-S1 decompression and fusion as depicted on the comparison radiographs.  Mild metal susceptibility artifact is noted.  Stable vertebral height and alignment. No marrow edema or evidence of acute osseous abnormality.   Visualized lower thoracic spinal cord is normal with conus medularis at L1.   Visualized abdominal viscera and paraspinal soft tissues are within normal limits.  T11-T12:  Mild facet hypertrophy.  T12-L1:  Negative.  L1-L2:  Mild circumferential disc bulge and facet hypertrophy greater on the left.  No significant stenosis.  L2-L3:  The right eccentric circumferential disc bulge.  Mild to moderate facet hypertrophy.  No significant stenosis.  L3-L4:  Circumferential disc bulge eccentric to the right. Moderate to severe facet and ligament flavum hypertrophy.  Moderate to severe spinal stenosis.  Moderate to severe bilateral lateral recess stenosis.  Mild bilateral  L3 foraminal stenosis.  L4-L5:  The choroid decompression and fusion.  Mild residual vertebral endplate spurring.  No significant stenosis.  L5-S1:  Sequelae of decompression and fusion.  Mild residual facet hypertrophy.  No significant stenosis.  IMPRESSION: 1.  Sequelae of L4-L5 and L5-S1 decompression and fusion without adverse features. 2.  Adjacent segment disease at L3-L4 with multifactorial moderate to severe  spinal and bilateral lateral recess stenosis.  Original Report Authenticated By: Randall An, M.D.    Medications: I have reviewed the patient's current medications.   Patient Active Hospital Problem List: Hyperkalemia (01/23/2011) Still elevated today.  Continue Kayexylate and increase to BID and will administer IVF's.  Will check potassium later this evening. Check BMP in Am  Diabetes mellitus (01/23/2011) Well controlled currently.   -Plan to discharge off of metformin.   Lower back pain (01/23/2011) Muscle spasm most likely.  -Will add flexeril today.   -MRI negative  CKD: At this point spoke with patient's PCP Dr. Heide Guile and he states that his last creatinine at the office a few months ago was 1.6.  Currently creatinine elevated.  BUN/Creatinine ration is elevated.  Will plan on IVF's with normal saline also plan on drawing labs to assess Fe NA.     LOS: 2 days   Velvet Bathe M.D.  [facility] 01/24/2011, 12:49 PM

## 2011-01-25 ENCOUNTER — Other Ambulatory Visit: Payer: Self-pay

## 2011-01-25 LAB — BASIC METABOLIC PANEL
BUN: 29 mg/dL — ABNORMAL HIGH (ref 6–23)
Calcium: 9 mg/dL (ref 8.4–10.5)
Calcium: 8.5 mg/dL (ref 8.4–10.5)
Creatinine, Ser: 1.93 mg/dL — ABNORMAL HIGH (ref 0.50–1.35)
GFR calc Af Amer: 45 mL/min — ABNORMAL LOW (ref >90)
GFR calc Af Amer: 42 mL/min — ABNORMAL LOW (ref >90)
GFR calc non Af Amer: 39 mL/min — ABNORMAL LOW (ref >90)
GFR calc non Af Amer: 36 mL/min — ABNORMAL LOW (ref >90)
Sodium: 140 mEq/L (ref 135–145)

## 2011-01-25 LAB — GLUCOSE, CAPILLARY
Glucose-Capillary: 113 mg/dL — ABNORMAL HIGH (ref 70–99)
Glucose-Capillary: 187 mg/dL — ABNORMAL HIGH (ref 70–99)

## 2011-01-25 LAB — POTASSIUM: Potassium: 4.6 mEq/L (ref 3.5–5.1)

## 2011-01-25 LAB — CK: Total CK: 56 U/L (ref 7–232)

## 2011-01-25 MED ORDER — SODIUM POLYSTYRENE SULFONATE 15 GM/60ML PO SUSP: 15.0000 g | Freq: Three times a day (TID) | ORAL | Status: DC

## 2011-01-25 MED ORDER — ALBUTEROL SULFATE (5 MG/ML) 0.5% IN NEBU: 2.5000 mg | INHALATION_SOLUTION | Freq: Once | RESPIRATORY_TRACT | Status: DC

## 2011-01-25 MED ORDER — SODIUM POLYSTYRENE SULFONATE 15 GM/60ML PO SUSP
15.0000 g | Freq: Three times a day (TID) | ORAL | Status: AC
  Administered 2011-01-25 (×3): 15 g via ORAL
  Filled 2011-01-25 (×3): qty 60

## 2011-01-25 NOTE — Progress Notes (Signed)
Interim history Patient initially came it secondary to low back pain after splitting wood.  Since he has had L4-L5 and L5-S1 fusion patient became concerned and decided to come to the ED to get evaluated.  MRI was negative and showed:  1. Sequelae of L4-L5 and L5-S1 decompression and fusion without adverse features. 2. Adjacent segment disease at L3-L4 with multifactorial moderate to severe spinal and bilateral lateral recess stenosis. So I diagnosed the patient with lower back muscle spasm of right paraspinal and gave him a muscle relaxer and pain controll with percocet.   The reason patient is still admitted is b/c his K levels have been elevated.  This morning was elevated and I repeated test and gave him albuterol and kayexalate.  Repeat testing today has shown K levels WNL's. Patient has also had elevated Creatinine level which has improved with IVF hydration and PCP reports that patient had elevated creatinine of ~ 1.6 based on his last tests back home  Subjective:  Patient reports improvement in his lower back pain.  Denies any acute issues denies any chest pain, or heart palpitations.  No acute issues overnight.   Objective: Filed Vitals:   01/25/11 0500 01/25/11 0830 01/25/11 1148 01/25/11 1647  BP: 182/81 149/81 169/93 156/98  Pulse: 59 82 73 78  Temp: 98.2 F (36.8 C) 98.6 F (37 C) 98.2 F (36.8 C) 98.5 F (36.9 C)  TempSrc: Oral     Resp: 20 16 16 18   Height:      Weight:      SpO2: 97% 96% 97% 98%   Weight change:   Intake/Output Summary (Last 24 hours) at 01/25/11 1729 Last data filed at 01/25/11 1300  Gross per 24 hour  Intake    720 ml  Output    700 ml  Net     20 ml    General: Alert, awake, oriented x3, in no acute distress.  HEENT: No bruits, no goiter.  Heart: Regular rate and rhythm, without murmurs, rubs, gallops.  Lungs: CTA BL  Abdomen: Soft, nontender, nondistended, positive bowel sounds.  Neuro: Grossly intact, nonfocal. Musculoskeletal:  Pt has  right paraspinal rigidity and tenderness on palpation.   Lab Results:  Basename 01/25/11 1418 01/25/11 0836 01/25/11 0650  NA 139 -- 140  K 4.5 4.6 --  CL 106 -- 108  CO2 24 -- 24  GLUCOSE 183* -- 76  BUN 29* -- 30*  CREATININE 1.93* -- 1.81*  CALCIUM 8.5 -- 9.0  MG -- -- --  PHOS -- -- --   No results found for this basename: AST:2,ALT:2,ALKPHOS:2,BILITOT:2,PROT:2,ALBUMIN:2 in the last 72 hours No results found for this basename: LIPASE:2,AMYLASE:2 in the last 72 hours  Basename 01/24/11 0042 01/22/11 2129  WBC 7.3 12.2*  NEUTROABS -- --  HGB 10.8* 13.1  HCT 31.9* 38.0*  MCV 89.4 88.8  PLT 123* 166    Basename 01/25/11 0836 01/23/11 0313  CKTOTAL 56 58  CKMB -- --  CKMBINDEX -- --  TROPONINI -- --   No components found with this basename: POCBNP:3 No results found for this basename: DDIMER:2 in the last 72 hours  Basename 01/23/11 0600  HGBA1C 5.5   No results found for this basename: CHOL:2,HDL:2,LDLCALC:2,TRIG:2,CHOLHDL:2,LDLDIRECT:2 in the last 72 hours No results found for this basename: TSH,T4TOTAL,FREET3,T3FREE,THYROIDAB in the last 72 hours No results found for this basename: VITAMINB12:2,FOLATE:2,FERRITIN:2,TIBC:2,IRON:2,RETICCTPCT:2 in the last 72 hours  Micro Results: No results found for this or any previous visit (from the past 240 hour(s)).  Studies/Results: No results found.  Medications: I have reviewed the patient's current medications.   Patient Active Hospital Problem List: Hyperkalemia (01/23/2011) - Kayexalate currently given TID - EKG - albuterol - repeat K levels today Suspect was secondary to Ace Inhibitor Continue holding ace I   Diabetes mellitus (01/23/2011) Stable currently  Lower back pain (01/23/2011) Most likely due to lower back muscle spasm -muscle relaxer - pain controll  Chronic kidney disease (01/24/2011) At this juncture creatinine back at home has been elevated per PCP.  Has improved with IVF hydration.  Will  reassess tomorrow  Disposition:  Pending results of potassium levels,  Given elevated creatinine level if patient goes home we will have to hold his metformin.  And given both his current creatinine and recent elevated K levels would not discharge patient on his Ace inhibitor (due to tendency of medication to cause ARF and hyperkalemia).     LOS: 3 days   Velvet Bathe M.D.  [facility] 01/25/2011, 5:29 PM

## 2011-01-25 NOTE — Progress Notes (Signed)
Pt smokes 1 ppd and has quit a few times before but he "always" relapses when he's around other smokers. He also says he enjoys smoking though he's aware of the risks it poses to his health. Pt in contemplation stage. Advised and encouraged pt to quit. Referred to 1-800 quit now for f/u and support. Discussed oral fixation substitutes, second hand smoke and in home smoking policy. Reviewed and gave pt Written education/contact information.

## 2011-01-26 LAB — BASIC METABOLIC PANEL
BUN: 27 mg/dL — ABNORMAL HIGH (ref 6–23)
Calcium: 8.9 mg/dL (ref 8.4–10.5)
Chloride: 108 mEq/L (ref 96–112)
Creatinine, Ser: 1.89 mg/dL — ABNORMAL HIGH (ref 0.50–1.35)
GFR calc Af Amer: 43 mL/min — ABNORMAL LOW (ref >90)
GFR calc non Af Amer: 37 mL/min — ABNORMAL LOW (ref >90)

## 2011-01-26 LAB — GLUCOSE, CAPILLARY
Glucose-Capillary: 84 mg/dL (ref 70–99)
Glucose-Capillary: 97 mg/dL (ref 70–99)

## 2011-01-26 MED ORDER — NICOTINE 21 MG/24HR TD PT24
21.0000 mg | MEDICATED_PATCH | Freq: Every day | TRANSDERMAL | Status: DC
  Administered 2011-01-26 – 2011-01-27 (×2): 21 mg via TRANSDERMAL
  Filled 2011-01-26 (×2): qty 1

## 2011-01-26 NOTE — Progress Notes (Signed)
Interim history Patient initially came it secondary to low back pain after splitting wood.  Since he has had L4-L5 and L5-S1 fusion patient became concerned and decided to come to the ED to get evaluated.  MRI was negative and showed:  1. Sequelae of L4-L5 and L5-S1 decompression and fusion without adverse features. 2. Adjacent segment disease at L3-L4 with multifactorial moderate to severe spinal and bilateral lateral recess stenosis. So I diagnosed the patient with lower back muscle spasm of right paraspinal and gave him a muscle relaxer and pain controll with percocet.   The reason patient is still admitted is b/c his K levels have been elevated.  This morning was elevated and I repeated test and gave him albuterol and kayexalate.  Repeat testing today has shown K levels WNL's. Patient has also had elevated Creatinine level which has improved with IVF hydration and PCP reports that patient had elevated creatinine of ~ 1.6 based on his last tests back home  Subjective: Patient reports that his lower back pain is stable. He did call his neurosurgeon. He will get a followup appointment with him.  Objective: Filed Vitals:   01/25/11 2000 01/26/11 0001 01/26/11 0400 01/26/11 1400  BP: 156/90 156/88 150/71 172/94  Pulse: 70 68 65 73  Temp: 98.2 F (36.8 C) 98 F (36.7 C) 98 F (36.7 C) 98.8 F (37.1 C)  TempSrc: Oral Oral Oral Oral  Resp: 20 16 20 18   Height:      Weight:      SpO2: 96% 97% 98% 97%   Weight change:   Intake/Output Summary (Last 24 hours) at 01/26/11 1748 Last data filed at 01/26/11 1200  Gross per 24 hour  Intake    720 ml  Output      0 ml  Net    720 ml    General: Alert, awake, oriented x3, in no acute distress.  HEENT: No bruits, no goiter.  Heart: Regular rate and rhythm, without murmurs, rubs, gallops.  Lungs: CTA BL  Abdomen: Soft, nontender, nondistended, positive bowel sounds.  Neuro: Grossly intact, nonfocal. Musculoskeletal:  Pt has right paraspinal  rigidity and tenderness on palpation.   Lab Results:  Basename 01/26/11 0549 01/25/11 1418  NA 139 139  K 5.0 4.5  CL 108 106  CO2 22 24  GLUCOSE 105* 183*  BUN 27* 29*  CREATININE 1.89* 1.93*  CALCIUM 8.9 8.5  MG -- --  PHOS -- --   No results found for this basename: AST:2,ALT:2,ALKPHOS:2,BILITOT:2,PROT:2,ALBUMIN:2 in the last 72 hours No results found for this basename: LIPASE:2,AMYLASE:2 in the last 72 hours  Basename 01/24/11 0042  WBC 7.3  NEUTROABS --  HGB 10.8*  HCT 31.9*  MCV 89.4  PLT 123*    Basename 01/25/11 0836  CKTOTAL 56  CKMB --  CKMBINDEX --  TROPONINI --   No components found with this basename: POCBNP:3 No results found for this basename: DDIMER:2 in the last 72 hours No results found for this basename: HGBA1C:2 in the last 72 hours No results found for this basename: CHOL:2,HDL:2,LDLCALC:2,TRIG:2,CHOLHDL:2,LDLDIRECT:2 in the last 72 hours No results found for this basename: TSH,T4TOTAL,FREET3,T3FREE,THYROIDAB in the last 72 hours No results found for this basename: VITAMINB12:2,FOLATE:2,FERRITIN:2,TIBC:2,IRON:2,RETICCTPCT:2 in the last 72 hours  Micro Results: No results found for this or any previous visit (from the past 240 hour(s)).  Studies/Results: No results found.  Medications: I have reviewed the patient's current medications.   Patient Active Hospital Problem List: Hyperkalemia (01/23/2011) - patient was given Kayexalate,  unsure why he is get in hyperkalemic. He was on ACE inhibitor on and metformin with chronic renal insufficiency he could have probably the her renal tubular acidosis. Will monitor potassium.  If potassium increased tomorrow then will consult nephrology  Diabetes mellitus (01/23/2011) Stable currently  Lower back pain (01/23/2011) Most likely due to lower back muscle spasm -muscle relaxer - pain controll -Discussed with patient following up with his neurosurgeon Dr. Arnoldo Morale. He has an appointment for February  15. Encouraged him to get a earlier appointment.   Chronic kidney disease (01/24/2011) At this juncture creatinine back at home has been elevated per PCP.  Has improved with IVF hydration.    Tobacco use: Nicotine patch, encourage smoking cessation.  Disposition:  Will check potassium level tomorrow. If it continues to increase then we'll get nephrology to see him. If it stabilizes or gets lower then will discharge him to follow up with PCP.     LOS: 4 days   Rosana Hoes MD, Sharlet Salina M.D.  [facility] 01/26/2011, 5:48 PM

## 2011-01-27 DIAGNOSIS — F17200 Nicotine dependence, unspecified, uncomplicated: Secondary | ICD-10-CM | POA: Diagnosis present

## 2011-01-27 DIAGNOSIS — M549 Dorsalgia, unspecified: Secondary | ICD-10-CM | POA: Diagnosis present

## 2011-01-27 DIAGNOSIS — I10 Essential (primary) hypertension: Secondary | ICD-10-CM | POA: Diagnosis present

## 2011-01-27 HISTORY — DX: Essential (primary) hypertension: I10

## 2011-01-27 HISTORY — DX: Dorsalgia, unspecified: M54.9

## 2011-01-27 LAB — BASIC METABOLIC PANEL
Chloride: 105 mEq/L (ref 96–112)
GFR calc non Af Amer: 35 mL/min — ABNORMAL LOW (ref >90)
Glucose, Bld: 76 mg/dL (ref 70–99)
Potassium: 4.6 mEq/L (ref 3.5–5.1)
Sodium: 139 mEq/L (ref 135–145)

## 2011-01-27 LAB — CBC
HCT: 37.2 % — ABNORMAL LOW (ref 39.0–52.0)
Hemoglobin: 12.7 g/dL — ABNORMAL LOW (ref 13.0–17.0)
MCH: 30.2 pg (ref 26.0–34.0)
RBC: 4.21 MIL/uL — ABNORMAL LOW (ref 4.22–5.81)

## 2011-01-27 MED ORDER — NICOTINE 21 MG/24HR TD PT24: 1.0000 | MEDICATED_PATCH | Freq: Every day | TRANSDERMAL | Status: AC

## 2011-01-27 MED ORDER — NICOTINE 21 MG/24HR TD PT24: 1.0000 | MEDICATED_PATCH | Freq: Every day | TRANSDERMAL | Status: DC

## 2011-01-27 MED ORDER — AMLODIPINE BESYLATE 2.5 MG PO TABS: 2.5000 mg | ORAL_TABLET | Freq: Every day | ORAL | Status: DC

## 2011-01-27 MED ORDER — CYCLOBENZAPRINE HCL 5 MG PO TABS: 5.0000 mg | ORAL_TABLET | Freq: Three times a day (TID) | ORAL | Status: AC | PRN

## 2011-01-27 NOTE — H&P (Addendum)
Physician Discharge Summary  Patient ID: Jon [name] MRN: [id number] DOB/AGE: Apr 01, 1950 61 y.o.  Admit date: 01/22/2011 Discharge date: 01/27/2011  Discharge Diagnoses:  Hyperkalemia Diabetes mellitus Lower back pain Chronic kidney disease Chronic back pain Tobacco use disorder HTN (hypertension)   Current Discharge Medication List    START taking these medications   Details  cyclobenzaprine (FLEXERIL) 5 MG tablet Take 1 tablet (5 mg total) by mouth 3 (three) times daily as needed for muscle spasms. Qty: 30 tablet, Refills: 0    nicotine (NICODERM CQ - DOSED IN MG/24 HOURS) 21 mg/24hr patch Place 1 patch (21 patches total) onto the skin daily. Qty: 28 patch, Refills: 0      CONTINUE these medications which have NOT CHANGED   Details  HYDROcodone-acetaminophen (NORCO) 10-325 MG per tablet Take 1 tablet by mouth every 5 (five) hours as needed. For pain    insulin glargine (LANTUS) 100 UNIT/ML injection Inject 40 Units into the skin at bedtime.    lovastatin (MEVACOR) 20 MG tablet Take 20 mg by mouth daily.    promethazine (PHENERGAN) 25 MG tablet Take 25 mg by mouth every 6 (six) hours as needed. For nausea      STOP taking these medications     lisinopril (PRINIVIL,ZESTRIL) 5 MG tablet      metFORMIN (GLUCOPHAGE) 1000 MG tablet      naproxen sodium (ANAPROX) 220 MG tablet         Discharge Orders    Future Orders Please Complete By Expires   Diet - low sodium heart healthy      Diet Carb Modified      Increase activity slowly           Disposition:  home  Discharged Condition: Stable  Full Code   Consults:  None  HPI: Jon [name] is an 61 y.o. male who was splitting wood 5 days ago and suffered increased 10/10 low back pain which worsened when he twisted his back while using the toilet. He had increased pain and had difficulty moving. He had contacted his neurosurgeon who had performed his lumbar fusion and was in the process of getting  approval by his insurance to get an MRI, but he had increased pain on 01/12 and was advised to come to the ED for pain control and further evaluation. Patient was seen in the ED and placed on the Back Pain Protocol and was to have an MRI in the AM, but was found to have an elevated potassium level of 6.9 when his baseline labs were drawn, so a repeat potassium level was performed and found to be 7.1. Patient was started on therapy by the EDP, and referred for medical admission. Patient denies taking any poatssium supplements, bu the does take an ace inhibitor and is on lisinopril 5mg  and has been on this for many years. He has a history of type 2 Diabetes.     PMH:  As per H & P FH: As per  H & P SH: As per H & P Med: as per H & P Allergies and ROS: as per H&P   Discharge Exam: Blood pressure 135/81, pulse 64, temperature 97.6 F (36.4 C), temperature source Oral, resp. rate 16, height 5\' 11"  (1.803 m), weight 101.1 kg (222 lb 14.2 oz), SpO2 98.00%.  Hospital Course:  Hyperkalemia Patient's potassium at the time of admission was 6.9 repeat levels 7.1. Patient was given Kayexalate Lasix. Potassium improved to 5 yesterday today 4.6. His ACE  inhibitor was discontinued. He was told not to use any naproxen. As his creatinine is 1.98. Will put him on Norvasc for blood pressure at this time and he can followup with his primary care for further management.  Diabetes mellitus Patient was on Lantus and metformin for his diabetes. Because of his creatinine being over 1.5 the metformin was discontinued and he was continued on the Lantus. Did not start another agent his blood sugars seem to be controlled with Lantus 40 units. He will follow up with Dr. Rex Kras on Monday.  Lower back pain Patient initially came in secondary to low back pain after splitting wood. Since he has had L4-L5 and L5-S1 fusion patient became concerned and decided to come to the ED to get evaluated. MRI was negative and showed: 1.  Sequelae of L4-L5 and L5-S1 decompression and fusion without adverse features. 2. Adjacent segment disease at L3-L4 with multifactorial moderate to severe spinal and bilateral lateral recess stenosis.  Discussed these results with patient. He has an appointment to follow up with his neurosurgeon Dr. Arnoldo Morale. His appointment is for February 15. Told him to schedule an earlier appointment. He will take his narcotic when necessary for pain control and Flexeril when necessary for muscle spasm.  Chronic kidney disease Patient was unaware that he has kidney disease probably secondary to diabetes. He will discuss with Dr. Rex Kras. He will follow up on Monday to get his creatinine rechecked. As mentioned before lisinopril and metformin were both discontinued. He was also told not to take naproxen.  Tobacco use disorder Patient was counseled on smoking cessation. He was also given a nicotine patch.  HTN (hypertension) Patient's blood pressure is stable without any medication. Started him on Norvasc 2.5 mg daily. Discussed with him following up with Dr. Rex Kras for any further recommendations. Labs:   Results for orders placed during the hospital encounter of 01/22/11 (from the past 48 hour(s))  GLUCOSE, CAPILLARY     Status: Abnormal   Collection Time   01/25/11 11:49 AM      Component Value Range Comment   Glucose-Capillary 113 (*) 70 - 99 (mg/dL)   BASIC METABOLIC PANEL     Status: Abnormal   Collection Time   01/25/11  2:18 PM      Component Value Range Comment   Sodium 139  135 - 145 (mEq/L)    Potassium 4.5  3.5 - 5.1 (mEq/L)    Chloride 106  96 - 112 (mEq/L)    CO2 24  19 - 32 (mEq/L)    Glucose, Bld 183 (*) 70 - 99 (mg/dL)    BUN 29 (*) 6 - 23 (mg/dL)    Creatinine, Ser 1.93 (*) 0.50 - 1.35 (mg/dL)    Calcium 8.5  8.4 - 10.5 (mg/dL)    GFR calc non Af Amer 36 (*) >90 (mL/min)    GFR calc Af Amer 42 (*) >90 (mL/min)   GLUCOSE, CAPILLARY     Status: Abnormal   Collection Time   01/25/11   4:51 PM      Component Value Range Comment   Glucose-Capillary 187 (*) 70 - 99 (mg/dL)   GLUCOSE, CAPILLARY     Status: Abnormal   Collection Time   01/25/11  8:00 PM      Component Value Range Comment   Glucose-Capillary 154 (*) 70 - 99 (mg/dL)    Comment 1 Notify RN     GLUCOSE, CAPILLARY     Status: Abnormal   Collection Time  01/25/11 10:01 PM      Component Value Range Comment   Glucose-Capillary 116 (*) 70 - 99 (mg/dL)   BASIC METABOLIC PANEL     Status: Abnormal   Collection Time   01/26/11  5:49 AM      Component Value Range Comment   Sodium 139  135 - 145 (mEq/L)    Potassium 5.0  3.5 - 5.1 (mEq/L)    Chloride 108  96 - 112 (mEq/L)    CO2 22  19 - 32 (mEq/L)    Glucose, Bld 105 (*) 70 - 99 (mg/dL)    BUN 27 (*) 6 - 23 (mg/dL)    Creatinine, Ser 1.89 (*) 0.50 - 1.35 (mg/dL)    Calcium 8.9  8.4 - 10.5 (mg/dL)    GFR calc non Af Amer 37 (*) >90 (mL/min)    GFR calc Af Amer 43 (*) >90 (mL/min)   GLUCOSE, CAPILLARY     Status: Normal   Collection Time   01/26/11  7:21 AM      Component Value Range Comment   Glucose-Capillary 84  70 - 99 (mg/dL)   GLUCOSE, CAPILLARY     Status: Normal   Collection Time   01/26/11 11:32 AM      Component Value Range Comment   Glucose-Capillary 90  70 - 99 (mg/dL)   GLUCOSE, CAPILLARY     Status: Normal   Collection Time   01/26/11  4:45 PM      Component Value Range Comment   Glucose-Capillary 83  70 - 99 (mg/dL)   GLUCOSE, CAPILLARY     Status: Normal   Collection Time   01/26/11  9:15 PM      Component Value Range Comment   Glucose-Capillary 97  70 - 99 (mg/dL)    Comment 1 Notify RN     BASIC METABOLIC PANEL     Status: Abnormal   Collection Time   01/27/11  5:45 AM      Component Value Range Comment   Sodium 139  135 - 145 (mEq/L)    Potassium 4.6  3.5 - 5.1 (mEq/L)    Chloride 105  96 - 112 (mEq/L)    CO2 25  19 - 32 (mEq/L)    Glucose, Bld 76  70 - 99 (mg/dL)    BUN 28 (*) 6 - 23 (mg/dL)    Creatinine, Ser 1.98 (*) 0.50 -  1.35 (mg/dL)    Calcium 9.3  8.4 - 10.5 (mg/dL)    GFR calc non Af Amer 35 (*) >90 (mL/min)    GFR calc Af Amer 41 (*) >90 (mL/min)   CBC     Status: Abnormal   Collection Time   01/27/11  5:45 AM      Component Value Range Comment   WBC 7.0  4.0 - 10.5 (K/uL)    RBC 4.21 (*) 4.22 - 5.81 (MIL/uL)    Hemoglobin 12.7 (*) 13.0 - 17.0 (g/dL)    HCT 37.2 (*) 39.0 - 52.0 (%)    MCV 88.4  78.0 - 100.0 (fL)    MCH 30.2  26.0 - 34.0 (pg)    MCHC 34.1  30.0 - 36.0 (g/dL)    RDW 13.2  11.5 - 15.5 (%)    Platelets 145 (*) 150 - 400 (K/uL)   GLUCOSE, CAPILLARY     Status: Abnormal   Collection Time   01/27/11  8:17 AM      Component Value Range Comment   Glucose-Capillary 154 (*)  70 - 99 (mg/dL)     Diagnostics:  Mr Lumbar Spine Wo Contrast  01/23/2011  *RADIOLOGY REPORT*  Clinical Data: 61 year old male with acute increased back pain. Prior back surgery.  MRI LUMBAR SPINE WITHOUT CONTRAST  Technique:  Multiplanar and multiecho pulse sequences of the lumbar spine were obtained without intravenous contrast.  Comparison: [facility] lumbar radiographs 01/23/2009.  Findings: The patient is status post L4-L5 and L5-S1 decompression and fusion as depicted on the comparison radiographs.  Mild metal susceptibility artifact is noted.  Stable vertebral height and alignment. No marrow edema or evidence of acute osseous abnormality.   Visualized lower thoracic spinal cord is normal with conus medularis at L1.   Visualized abdominal viscera and paraspinal soft tissues are within normal limits.  T11-T12:  Mild facet hypertrophy.  T12-L1:  Negative.  L1-L2:  Mild circumferential disc bulge and facet hypertrophy greater on the left.  No significant stenosis.  L2-L3:  The right eccentric circumferential disc bulge.  Mild to moderate facet hypertrophy.  No significant stenosis.  L3-L4:  Circumferential disc bulge eccentric to the right. Moderate to severe facet and ligament flavum hypertrophy.   Moderate to severe spinal stenosis.  Moderate to severe bilateral lateral recess stenosis.  Mild bilateral L3 foraminal stenosis.  L4-L5:  The choroid decompression and fusion.  Mild residual vertebral endplate spurring.  No significant stenosis.  L5-S1:  Sequelae of decompression and fusion.  Mild residual facet hypertrophy.  No significant stenosis.  IMPRESSION: 1.  Sequelae of L4-L5 and L5-S1 decompression and fusion without adverse features. 2.  Adjacent segment disease at L3-L4 with multifactorial moderate to severe spinal and bilateral lateral recess stenosis.  Original Report Authenticated By: Randall An, M.D.   Time spent with patient and wife and doing this discharge is approximately 45 minutes.   SignedRosana Hoes MD, Sharlet Salina 01/27/2011, 8:49 AM  This is a discharge summary

## 2012-01-13 ENCOUNTER — Other Ambulatory Visit: Payer: Self-pay | Admitting: Family Medicine

## 2012-01-13 ENCOUNTER — Ambulatory Visit
Admission: RE | Admit: 2012-01-13 | Discharge: 2012-01-13 | Disposition: A | Discharge status: Home / Self Care | Payer: Medicare Other | Source: Ambulatory Visit | Attending: Family Medicine | Admitting: Family Medicine

## 2012-01-13 DIAGNOSIS — R609 Edema, unspecified: Secondary | ICD-10-CM

## 2012-11-08 ENCOUNTER — Other Ambulatory Visit: Payer: Self-pay | Admitting: Neurosurgery

## 2012-11-08 DIAGNOSIS — M5416 Radiculopathy, lumbar region: Secondary | ICD-10-CM

## 2012-11-14 ENCOUNTER — Ambulatory Visit
Admission: RE | Admit: 2012-11-14 | Discharge: 2012-11-14 | Disposition: A | Discharge status: Home / Self Care | Payer: Medicare Other | Source: Ambulatory Visit | Attending: Neurosurgery | Admitting: Neurosurgery

## 2012-11-14 DIAGNOSIS — M5416 Radiculopathy, lumbar region: Secondary | ICD-10-CM

## 2012-11-14 MED ORDER — IOHEXOL 180 MG/ML  SOLN
18.0000 mL | Freq: Once | INTRAMUSCULAR | Status: AC | PRN
  Administered 2012-11-14: 18 mL via INTRATHECAL

## 2012-11-14 MED ORDER — DIAZEPAM 5 MG PO TABS
10.0000 mg | ORAL_TABLET | Freq: Once | ORAL | Status: AC
  Administered 2012-11-14: 10 mg via ORAL

## 2012-12-10 ENCOUNTER — Other Ambulatory Visit: Payer: Self-pay | Admitting: Neurosurgery

## 2012-12-10 HISTORY — PX: BACK SURGERY: SHX140

## 2012-12-18 ENCOUNTER — Encounter (HOSPITAL_COMMUNITY): Payer: Self-pay | Admitting: Pharmacy Technician

## 2012-12-19 NOTE — Pre-Procedure Instructions (Signed)
KEM ALESSI  12/19/2012   Your procedure is scheduled on:  Thurs, Dec 18 @ 10:30 AM  Report to [facility] A at 7:30 AM.  Call this number if you have problems the morning of surgery: [phone]   Remember:   Do not eat food or drink liquids after midnight.   Take these medicines the morning of surgery with A SIP OF WATER: Amlodipine(Norvasc) and Clonidine(Catapres)               Stop taking your Aspirin. No Goody's,BC's,Aleve,Ibuprofen,Fish Oil,or any Herbal Medications   Do not wear jewelry  Do not wear lotions, powders, or colognes. You may wear deodorant.  Men may shave face and neck.  Do not bring valuables to the hospital.  [facility] is not responsible                  for any belongings or valuables.               Contacts, dentures or bridgework may not be worn into surgery.  Leave suitcase in the car. After surgery it may be brought to your room.  For patients admitted to the hospital, discharge time is determined by your                treatment team.                 Special Instructions: Shower using CHG 2 nights before surgery and the night before surgery.  If you shower the day of surgery use CHG.  Use special wash - you have one bottle of CHG for all showers.  You should use approximately 1/3 of the bottle for each shower.   Please read over the following fact sheets that you were given: Pain Booklet, Coughing and Deep Breathing, Blood Transfusion Information, MRSA Information and Surgical Site Infection Prevention

## 2012-12-20 ENCOUNTER — Encounter (HOSPITAL_COMMUNITY)
Admission: RE | Admit: 2012-12-20 | Discharge: 2012-12-20 | Disposition: A | Discharge status: Home / Self Care | Payer: Medicare Other | Source: Ambulatory Visit | Attending: Anesthesiology | Admitting: Anesthesiology

## 2012-12-20 ENCOUNTER — Encounter (HOSPITAL_COMMUNITY): Payer: Self-pay

## 2012-12-20 ENCOUNTER — Other Ambulatory Visit (HOSPITAL_COMMUNITY): Payer: Medicare Other

## 2012-12-20 ENCOUNTER — Encounter (HOSPITAL_COMMUNITY)
Admission: RE | Admit: 2012-12-20 | Discharge: 2012-12-20 | Disposition: A | Discharge status: Home / Self Care | Payer: Medicare Other | Source: Ambulatory Visit | Attending: Neurosurgery | Admitting: Neurosurgery

## 2012-12-20 DIAGNOSIS — Z01810 Encounter for preprocedural cardiovascular examination: Secondary | ICD-10-CM | POA: Insufficient documentation

## 2012-12-20 DIAGNOSIS — Z01812 Encounter for preprocedural laboratory examination: Secondary | ICD-10-CM | POA: Insufficient documentation

## 2012-12-20 DIAGNOSIS — Z01818 Encounter for other preprocedural examination: Secondary | ICD-10-CM | POA: Insufficient documentation

## 2012-12-20 HISTORY — DX: Sleep apnea, unspecified: G47.30

## 2012-12-20 HISTORY — DX: Localized edema: R60.0

## 2012-12-20 HISTORY — DX: Nocturia: R35.1

## 2012-12-20 HISTORY — DX: Edema, unspecified: R60.9

## 2012-12-20 HISTORY — DX: Other chronic pain: G89.29

## 2012-12-20 HISTORY — DX: Personal history of colonic polyps: Z86.010

## 2012-12-20 HISTORY — DX: Frequency of micturition: R35.0

## 2012-12-20 HISTORY — DX: Personal history of colon polyps, unspecified: Z86.0100

## 2012-12-20 HISTORY — DX: Personal history of other diseases of the digestive system: Z87.19

## 2012-12-20 HISTORY — DX: Hyperlipidemia, unspecified: E78.5

## 2012-12-20 HISTORY — DX: Unspecified osteoarthritis, unspecified site: M19.90

## 2012-12-20 HISTORY — DX: Dorsalgia, unspecified: M54.9

## 2012-12-20 LAB — CBC
HCT: 31.4 % — ABNORMAL LOW (ref 39.0–52.0)
Hemoglobin: 11 g/dL — ABNORMAL LOW (ref 13.0–17.0)
MCH: 30.6 pg (ref 26.0–34.0)
MCHC: 35 g/dL (ref 30.0–36.0)
MCV: 87.2 fL (ref 78.0–100.0)
RDW: 13.1 % (ref 11.5–15.5)

## 2012-12-20 LAB — SURGICAL PCR SCREEN: MRSA, PCR: NEGATIVE

## 2012-12-20 LAB — BASIC METABOLIC PANEL
BUN: 69 mg/dL — ABNORMAL HIGH (ref 6–23)
CO2: 18 mEq/L — ABNORMAL LOW (ref 19–32)
Chloride: 107 mEq/L (ref 96–112)
Creatinine, Ser: 4.2 mg/dL — ABNORMAL HIGH (ref 0.50–1.35)
GFR calc Af Amer: 16 mL/min — ABNORMAL LOW (ref >90)
Glucose, Bld: 312 mg/dL — ABNORMAL HIGH (ref 70–99)
Potassium: 4.5 mEq/L (ref 3.5–5.1)

## 2012-12-20 LAB — TYPE AND SCREEN: Antibody Screen: NEGATIVE

## 2012-12-20 NOTE — Progress Notes (Signed)
Pt doesn't have a cardiologist  Denies ever having an echo/stress test/heart cath  Medical Md is Dr.John Lin Landsman  Denies EKG or CXR in past year  Sleep study in Aug 2014-was done at home-to request report from [place] uses CPAP

## 2012-12-20 NOTE — Progress Notes (Signed)
Seeing Dr.Mattingly with last visit a month ago creatinine was 3.9

## 2012-12-21 NOTE — Progress Notes (Addendum)
Anesthesia Chart Review:  Patient is a 62 year old male scheduled for L3-4 PLIF on 12/27/12 by Dr. Arnoldo Morale.  History includes smoking, CKD, HTN, HLD, OSA with CPAP use, hiatal hernia, DM2, peripheral edema, bronchitis, PNA, arthritis, obesity.  Nephrologist is Dr. Fleet Contras at [facility]). PCP is Dr. Lovette Cliche, whom he is scheduled to see on 12/015/14.    EKG on 12/20/12 showed NSR, left atrial abnormality. He denied prior cardiac testing such as echo, stress, or cath.  CXR on 12/20/12 showed no active cardiopulmonary disease.  Preoperative labs noted.  BUN/Cr 69/4.20.  (He reported his Cr last month at [facility] was 3.9.) Glucose 312.  H/H 11.0/31.4.  PLT 139.  I spoke with patient this today.  He reports that he had just eaten breakfast prior to his PAT visit.  His last three morning fasting glucose readings have been 93 - 180.  He felt he had better control when he was on Lantus, but he is going to talk with Dr. Lin Landsman on Monday regarding switching back to Lantus.  He was told to ensure Dr. Lin Landsman is aware of plans for surgery so DM can be optimized preoperatively.  Patient reports fasting glucose levels < 200 and is scheduled to see his PCP on Monday for continued DM follow-up and additional recommendations, if any.  Patient's CKD is followed by Dr. [name].  He is not yet on dialysis, but said that Dr. [name] told him that if his renal function continued to worsen he would need to be evaluated for a possible AVF/AVGG. I did notify patient that surgery can sometimes worsen renal function, so I have asked that Dr. Adline Mango get nephrology input/clearance prior to surgery.  Manuela Schwartz at Dr. Arnoldo Morale' office has contacted [place].  Input is still pending.  She will fax clearance when and if received.    I'll follow-up renal and PCP notes once available.  George Hugh [facility]/Anesthesiology Phone [phone] 12/21/2012 3:23 PM  Addendum: 12/24/2012  10:00 AM Dr. [name] signed note for nephrology clearance, but added "with his renal function, any surgery poses risk of ARF requiring hemodialysis."

## 2012-12-26 MED ORDER — CEFAZOLIN SODIUM-DEXTROSE 2-3 GM-% IV SOLR
2.0000 g | INTRAVENOUS | Status: AC
  Administered 2012-12-27: 2 g via INTRAVENOUS
  Filled 2012-12-26: qty 50

## 2012-12-27 ENCOUNTER — Encounter (HOSPITAL_COMMUNITY)
Admission: RE | Disposition: A | Discharge status: Home / Self Care | Payer: Medicare Other | Source: Ambulatory Visit | Attending: Neurosurgery

## 2012-12-27 ENCOUNTER — Encounter (HOSPITAL_COMMUNITY): Payer: Self-pay | Admitting: *Deleted

## 2012-12-27 ENCOUNTER — Encounter (HOSPITAL_COMMUNITY): Payer: Medicare Other | Admitting: Vascular Surgery

## 2012-12-27 ENCOUNTER — Inpatient Hospital Stay (HOSPITAL_COMMUNITY): Payer: Medicare Other | Admitting: Anesthesiology

## 2012-12-27 ENCOUNTER — Inpatient Hospital Stay (HOSPITAL_COMMUNITY)
Admission: RE | Admit: 2012-12-27 | Discharge: 2012-12-30 | DRG: 460 | Disposition: A | Discharge status: Home / Self Care | Payer: Medicare Other | Source: Ambulatory Visit | Attending: Neurosurgery | Admitting: Neurosurgery

## 2012-12-27 ENCOUNTER — Inpatient Hospital Stay (HOSPITAL_COMMUNITY): Payer: Medicare Other

## 2012-12-27 DIAGNOSIS — M48062 Spinal stenosis, lumbar region with neurogenic claudication: Principal | ICD-10-CM | POA: Diagnosis present

## 2012-12-27 DIAGNOSIS — N189 Chronic kidney disease, unspecified: Secondary | ICD-10-CM | POA: Diagnosis present

## 2012-12-27 DIAGNOSIS — G4730 Sleep apnea, unspecified: Secondary | ICD-10-CM | POA: Diagnosis present

## 2012-12-27 DIAGNOSIS — M47812 Spondylosis without myelopathy or radiculopathy, cervical region: Secondary | ICD-10-CM

## 2012-12-27 DIAGNOSIS — Z7982 Long term (current) use of aspirin: Secondary | ICD-10-CM

## 2012-12-27 DIAGNOSIS — E785 Hyperlipidemia, unspecified: Secondary | ICD-10-CM | POA: Diagnosis present

## 2012-12-27 DIAGNOSIS — F17200 Nicotine dependence, unspecified, uncomplicated: Secondary | ICD-10-CM | POA: Diagnosis present

## 2012-12-27 DIAGNOSIS — Z794 Long term (current) use of insulin: Secondary | ICD-10-CM

## 2012-12-27 DIAGNOSIS — E119 Type 2 diabetes mellitus without complications: Secondary | ICD-10-CM | POA: Diagnosis present

## 2012-12-27 DIAGNOSIS — M129 Arthropathy, unspecified: Secondary | ICD-10-CM | POA: Diagnosis present

## 2012-12-27 DIAGNOSIS — Z981 Arthrodesis status: Secondary | ICD-10-CM

## 2012-12-27 HISTORY — DX: Spondylosis without myelopathy or radiculopathy, cervical region: M47.812

## 2012-12-27 LAB — GLUCOSE, CAPILLARY
Glucose-Capillary: 126 mg/dL — ABNORMAL HIGH (ref 70–99)
Glucose-Capillary: 112 mg/dL — ABNORMAL HIGH (ref 70–99)
Glucose-Capillary: 104 mg/dL — ABNORMAL HIGH (ref 70–99)

## 2012-12-27 SURGERY — POSTERIOR LUMBAR FUSION 1 LEVEL
Anesthesia: General | Site: Back

## 2012-12-27 MED ORDER — FUROSEMIDE 40 MG PO TABS
40.0000 mg | ORAL_TABLET | Freq: Two times a day (BID) | ORAL | Status: DC
  Administered 2012-12-28 (×2): 40 mg via ORAL
  Filled 2012-12-27 (×8): qty 2

## 2012-12-27 MED ORDER — FENTANYL CITRATE 0.05 MG/ML IJ SOLN
INTRAMUSCULAR | Status: DC | PRN
  Administered 2012-12-27: 50 ug via INTRAVENOUS
  Administered 2012-12-27: 100 ug via INTRAVENOUS
  Administered 2012-12-27 (×9): 50 ug via INTRAVENOUS

## 2012-12-27 MED ORDER — EPHEDRINE SULFATE 50 MG/ML IJ SOLN
INTRAMUSCULAR | Status: DC | PRN
  Administered 2012-12-27: 10 mg via INTRAVENOUS

## 2012-12-27 MED ORDER — AMLODIPINE BESYLATE 10 MG PO TABS
10.0000 mg | ORAL_TABLET | Freq: Every day | ORAL | Status: DC
  Administered 2012-12-28 – 2012-12-30 (×3): 10 mg via ORAL
  Filled 2012-12-27 (×4): qty 1

## 2012-12-27 MED ORDER — DIPHENHYDRAMINE HCL 12.5 MG/5ML PO ELIX: 12.5000 mg | ORAL_SOLUTION | Freq: Four times a day (QID) | ORAL | Status: DC | PRN

## 2012-12-27 MED ORDER — LIDOCAINE HCL (CARDIAC) 20 MG/ML IV SOLN
INTRAVENOUS | Status: DC | PRN
  Administered 2012-12-27: 80 mg via INTRAVENOUS

## 2012-12-27 MED ORDER — ALUM & MAG HYDROXIDE-SIMETH 200-200-20 MG/5ML PO SUSP: 30.0000 mL | Freq: Four times a day (QID) | ORAL | Status: DC | PRN

## 2012-12-27 MED ORDER — LACTATED RINGERS IV SOLN
INTRAVENOUS | Status: DC
  Administered 2012-12-27 – 2012-12-28 (×2): via INTRAVENOUS

## 2012-12-27 MED ORDER — OXYCODONE HCL 5 MG/5ML PO SOLN: 5.0000 mg | Freq: Once | ORAL | Status: DC | PRN

## 2012-12-27 MED ORDER — HYDROMORPHONE HCL PF 1 MG/ML IJ SOLN
INTRAMUSCULAR | Status: AC
  Filled 2012-12-27: qty 1

## 2012-12-27 MED ORDER — PHENOL 1.4 % MT LIQD: 1.0000 | OROMUCOSAL | Status: DC | PRN

## 2012-12-27 MED ORDER — ACETAMINOPHEN 325 MG PO TABS: 650.0000 mg | ORAL_TABLET | ORAL | Status: DC | PRN

## 2012-12-27 MED ORDER — HYDROCODONE-ACETAMINOPHEN 5-325 MG PO TABS: 1.0000 | ORAL_TABLET | ORAL | Status: DC | PRN

## 2012-12-27 MED ORDER — HYDROMORPHONE HCL PF 1 MG/ML IJ SOLN
0.2500 mg | INTRAMUSCULAR | Status: DC | PRN
  Administered 2012-12-27 (×2): 0.5 mg via INTRAVENOUS

## 2012-12-27 MED ORDER — PNEUMOCOCCAL VAC POLYVALENT 25 MCG/0.5ML IJ INJ
0.5000 mL | INJECTION | INTRAMUSCULAR | Status: AC
  Administered 2012-12-28: 0.5 mL via INTRAMUSCULAR
  Filled 2012-12-27: qty 0.5

## 2012-12-27 MED ORDER — ALBUMIN HUMAN 5 % IV SOLN
INTRAVENOUS | Status: DC | PRN
  Administered 2012-12-27: 15:00:00 via INTRAVENOUS

## 2012-12-27 MED ORDER — THROMBIN 20000 UNITS EX SOLR
CUTANEOUS | Status: DC | PRN
  Administered 2012-12-27: 13:00:00 via TOPICAL

## 2012-12-27 MED ORDER — ONDANSETRON HCL 4 MG/2ML IJ SOLN
INTRAMUSCULAR | Status: DC | PRN
  Administered 2012-12-27: 4 mg via INTRAVENOUS

## 2012-12-27 MED ORDER — BUPIVACAINE-EPINEPHRINE PF 0.5-1:200000 % IJ SOLN
INTRAMUSCULAR | Status: DC | PRN
  Administered 2012-12-27: 10 mL via PERINEURAL

## 2012-12-27 MED ORDER — CALCITRIOL 0.25 MCG PO CAPS
0.2500 ug | ORAL_CAPSULE | Freq: Every day | ORAL | Status: DC
  Administered 2012-12-27 – 2012-12-30 (×4): 0.25 ug via ORAL
  Filled 2012-12-27 (×4): qty 1

## 2012-12-27 MED ORDER — ACETAMINOPHEN 650 MG RE SUPP: 650.0000 mg | RECTAL | Status: DC | PRN

## 2012-12-27 MED ORDER — CEFAZOLIN SODIUM-DEXTROSE 2-3 GM-% IV SOLR
2.0000 g | Freq: Three times a day (TID) | INTRAVENOUS | Status: AC
  Administered 2012-12-27 – 2012-12-28 (×2): 2 g via INTRAVENOUS
  Filled 2012-12-27 (×2): qty 50

## 2012-12-27 MED ORDER — LACTATED RINGERS IV SOLN
INTRAVENOUS | Status: DC | PRN
  Administered 2012-12-27 (×3): via INTRAVENOUS

## 2012-12-27 MED ORDER — INSULIN ASPART 100 UNIT/ML ~~LOC~~ SOLN: 0.0000 [IU] | SUBCUTANEOUS | Status: DC

## 2012-12-27 MED ORDER — MENTHOL 3 MG MT LOZG: 1.0000 | LOZENGE | OROMUCOSAL | Status: DC | PRN

## 2012-12-27 MED ORDER — ARTIFICIAL TEARS OP OINT
TOPICAL_OINTMENT | OPHTHALMIC | Status: DC | PRN
  Administered 2012-12-27: 1 via OPHTHALMIC

## 2012-12-27 MED ORDER — OXYCODONE-ACETAMINOPHEN 5-325 MG PO TABS
1.0000 | ORAL_TABLET | ORAL | Status: DC | PRN
  Administered 2012-12-28 – 2012-12-30 (×9): 2 via ORAL
  Filled 2012-12-27 (×9): qty 2

## 2012-12-27 MED ORDER — NALOXONE HCL 0.4 MG/ML IJ SOLN: 0.4000 mg | INTRAMUSCULAR | Status: DC | PRN

## 2012-12-27 MED ORDER — GLYCOPYRROLATE 0.2 MG/ML IJ SOLN
INTRAMUSCULAR | Status: DC | PRN
  Administered 2012-12-27: .6 mg via INTRAVENOUS

## 2012-12-27 MED ORDER — LACTATED RINGERS IV SOLN
INTRAVENOUS | Status: DC
  Administered 2012-12-27: 08:00:00 via INTRAVENOUS

## 2012-12-27 MED ORDER — ONDANSETRON HCL 4 MG/2ML IJ SOLN: 4.0000 mg | Freq: Four times a day (QID) | INTRAMUSCULAR | Status: DC | PRN

## 2012-12-27 MED ORDER — BUPIVACAINE LIPOSOME 1.3 % IJ SUSP
20.0000 mL | INTRAMUSCULAR | Status: AC
  Filled 2012-12-27: qty 20

## 2012-12-27 MED ORDER — OXYMETAZOLINE HCL 0.05 % NA SOLN
NASAL | Status: DC | PRN
  Administered 2012-12-27: 2 via NASAL

## 2012-12-27 MED ORDER — DIPHENHYDRAMINE HCL 50 MG/ML IJ SOLN: 12.5000 mg | Freq: Four times a day (QID) | INTRAMUSCULAR | Status: DC | PRN

## 2012-12-27 MED ORDER — SIMVASTATIN 10 MG PO TABS
10.0000 mg | ORAL_TABLET | Freq: Every day | ORAL | Status: DC
  Administered 2012-12-28 – 2012-12-29 (×2): 10 mg via ORAL
  Filled 2012-12-27 (×3): qty 1

## 2012-12-27 MED ORDER — NEOSTIGMINE METHYLSULFATE 1 MG/ML IJ SOLN
INTRAMUSCULAR | Status: DC | PRN
  Administered 2012-12-27: 4 mg via INTRAVENOUS

## 2012-12-27 MED ORDER — BACITRACIN ZINC 500 UNIT/GM EX OINT
TOPICAL_OINTMENT | CUTANEOUS | Status: DC | PRN
  Administered 2012-12-27: 1 via TOPICAL

## 2012-12-27 MED ORDER — PROPOFOL 10 MG/ML IV BOLUS
INTRAVENOUS | Status: DC | PRN
  Administered 2012-12-27: 20 mg via INTRAVENOUS
  Administered 2012-12-27: 150 mg via INTRAVENOUS

## 2012-12-27 MED ORDER — 0.9 % SODIUM CHLORIDE (POUR BTL) OPTIME
TOPICAL | Status: DC | PRN
  Administered 2012-12-27: 1000 mL

## 2012-12-27 MED ORDER — DIAZEPAM 5 MG PO TABS
5.0000 mg | ORAL_TABLET | Freq: Four times a day (QID) | ORAL | Status: DC | PRN
  Administered 2012-12-28 – 2012-12-30 (×3): 5 mg via ORAL
  Filled 2012-12-27 (×3): qty 1

## 2012-12-27 MED ORDER — BUPIVACAINE LIPOSOME 1.3 % IJ SUSP
INTRAMUSCULAR | Status: DC | PRN
  Administered 2012-12-27: 20 mL

## 2012-12-27 MED ORDER — DOCUSATE SODIUM 100 MG PO CAPS
100.0000 mg | ORAL_CAPSULE | Freq: Two times a day (BID) | ORAL | Status: DC
  Administered 2012-12-27 – 2012-12-30 (×6): 100 mg via ORAL
  Filled 2012-12-27 (×6): qty 1

## 2012-12-27 MED ORDER — ONDANSETRON HCL 4 MG/2ML IJ SOLN
4.0000 mg | INTRAMUSCULAR | Status: DC | PRN
  Administered 2012-12-27 – 2012-12-28 (×2): 4 mg via INTRAVENOUS
  Filled 2012-12-27: qty 2

## 2012-12-27 MED ORDER — SODIUM CHLORIDE 0.9 % IJ SOLN: 9.0000 mL | INTRAMUSCULAR | Status: DC | PRN

## 2012-12-27 MED ORDER — MORPHINE SULFATE (PF) 1 MG/ML IV SOLN
INTRAVENOUS | Status: DC
  Administered 2012-12-27 – 2012-12-28 (×2): via INTRAVENOUS
  Administered 2012-12-28: 16.5 mg via INTRAVENOUS
  Administered 2012-12-28: 10.5 mg via INTRAVENOUS
  Administered 2012-12-28: 6 mg via INTRAVENOUS
  Filled 2012-12-27 (×2): qty 25

## 2012-12-27 MED ORDER — CLONIDINE HCL 0.3 MG PO TABS
0.3000 mg | ORAL_TABLET | Freq: Two times a day (BID) | ORAL | Status: DC
  Administered 2012-12-27 – 2012-12-30 (×6): 0.3 mg via ORAL
  Filled 2012-12-27 (×8): qty 1

## 2012-12-27 MED ORDER — INSULIN DETEMIR 100 UNIT/ML ~~LOC~~ SOLN
53.0000 [IU] | Freq: Every day | SUBCUTANEOUS | Status: DC
  Administered 2012-12-27 – 2012-12-29 (×3): 53 [IU] via SUBCUTANEOUS
  Filled 2012-12-27 (×4): qty 0.53

## 2012-12-27 MED ORDER — ROCURONIUM BROMIDE 100 MG/10ML IV SOLN
INTRAVENOUS | Status: DC | PRN
  Administered 2012-12-27: 10 mg via INTRAVENOUS
  Administered 2012-12-27: 50 mg via INTRAVENOUS
  Administered 2012-12-27: 10 mg via INTRAVENOUS

## 2012-12-27 MED ORDER — ONDANSETRON HCL 4 MG/2ML IJ SOLN
4.0000 mg | Freq: Four times a day (QID) | INTRAMUSCULAR | Status: DC | PRN
  Filled 2012-12-27: qty 2

## 2012-12-27 MED ORDER — INSULIN ASPART 100 UNIT/ML ~~LOC~~ SOLN: 0.0000 [IU] | Freq: Three times a day (TID) | SUBCUTANEOUS | Status: DC

## 2012-12-27 MED ORDER — OXYCODONE HCL 5 MG PO TABS: 5.0000 mg | ORAL_TABLET | Freq: Once | ORAL | Status: DC | PRN

## 2012-12-27 MED ORDER — MIDAZOLAM HCL 5 MG/5ML IJ SOLN
INTRAMUSCULAR | Status: DC | PRN
  Administered 2012-12-27: 2 mg via INTRAVENOUS

## 2012-12-27 MED ORDER — SODIUM CHLORIDE 0.9 % IR SOLN
Status: DC | PRN
  Administered 2012-12-27: 13:00:00

## 2012-12-27 SURGICAL SUPPLY — 69 items
APL SKNCLS STERI-STRIP NONHPOA (GAUZE/BANDAGES/DRESSINGS) ×1
BAG DECANTER FOR FLEXI CONT (MISCELLANEOUS) ×2 IMPLANT
BENZOIN TINCTURE PRP APPL 2/3 (GAUZE/BANDAGES/DRESSINGS) ×2 IMPLANT
BLADE SURG ROTATE 9660 (MISCELLANEOUS) IMPLANT
BRUSH SCRUB EZ PLAIN DRY (MISCELLANEOUS) ×2 IMPLANT
BUR ACORN 6.0 (BURR) ×3 IMPLANT
BUR MATCHSTICK NEURO 3.0 LAGG (BURR) ×2 IMPLANT
CAGE CAPSTONE 15X26 (Cage) ×2 IMPLANT
CANISTER SUCT 3000ML (MISCELLANEOUS) ×2 IMPLANT
CONT SPEC 4OZ CLIKSEAL STRL BL (MISCELLANEOUS) ×2 IMPLANT
COVER BACK TABLE 24X17X13 BIG (DRAPES) IMPLANT
COVER TABLE BACK 60X90 (DRAPES) ×2 IMPLANT
DRAPE C-ARM 42X72 X-RAY (DRAPES) ×4 IMPLANT
DRAPE LAPAROTOMY 100X72X124 (DRAPES) ×2 IMPLANT
DRAPE POUCH INSTRU U-SHP 10X18 (DRAPES) ×2 IMPLANT
DRAPE PROXIMA HALF (DRAPES) ×2 IMPLANT
DRAPE SURG 17X23 STRL (DRAPES) ×8 IMPLANT
ELECT BLADE 4.0 EZ CLEAN MEGAD (MISCELLANEOUS) ×2
ELECT REM PT RETURN 9FT ADLT (ELECTROSURGICAL) ×2
ELECTRODE BLDE 4.0 EZ CLN MEGD (MISCELLANEOUS) ×1 IMPLANT
ELECTRODE REM PT RTRN 9FT ADLT (ELECTROSURGICAL) ×1 IMPLANT
EVACUATOR 1/8 PVC DRAIN (DRAIN) ×1 IMPLANT
GAUZE SPONGE 4X4 16PLY XRAY LF (GAUZE/BANDAGES/DRESSINGS) ×2 IMPLANT
GLOVE BIO SURGEON STRL SZ8.5 (GLOVE) ×4 IMPLANT
GLOVE BIOGEL M 6.5 STRL (GLOVE) ×3 IMPLANT
GLOVE BIOGEL PI IND STRL 7.0 (GLOVE) IMPLANT
GLOVE BIOGEL PI INDICATOR 7.0 (GLOVE) ×4
GLOVE EXAM NITRILE LRG STRL (GLOVE) IMPLANT
GLOVE EXAM NITRILE MD LF STRL (GLOVE) IMPLANT
GLOVE EXAM NITRILE XL STR (GLOVE) IMPLANT
GLOVE EXAM NITRILE XS STR PU (GLOVE) IMPLANT
GLOVE OPTIFIT SS 6.5 STRL BRWN (GLOVE) ×4 IMPLANT
GLOVE SS BIOGEL STRL SZ 8 (GLOVE) ×2 IMPLANT
GLOVE SUPERSENSE BIOGEL SZ 8 (GLOVE) ×2
GOWN BRE IMP SLV AUR LG STRL (GOWN DISPOSABLE) ×3 IMPLANT
GOWN BRE IMP SLV AUR XL STRL (GOWN DISPOSABLE) ×5 IMPLANT
GOWN STRL REIN 2XL LVL4 (GOWN DISPOSABLE) IMPLANT
KIT BASIN OR (CUSTOM PROCEDURE TRAY) ×2 IMPLANT
KIT ROOM TURNOVER OR (KITS) ×2 IMPLANT
NDL HYPO 21X1.5 SAFETY (NEEDLE) IMPLANT
NEEDLE HYPO 21X1.5 SAFETY (NEEDLE) ×2 IMPLANT
NEEDLE HYPO 22GX1.5 SAFETY (NEEDLE) ×2 IMPLANT
NS IRRIG 1000ML POUR BTL (IV SOLUTION) ×2 IMPLANT
PACK FOAM VITOSS 10CC (Orthopedic Implant) ×1 IMPLANT
PACK LAMINECTOMY NEURO (CUSTOM PROCEDURE TRAY) ×2 IMPLANT
PAD ARMBOARD 7.5X6 YLW CONV (MISCELLANEOUS) ×6 IMPLANT
PATTIES SURGICAL .5 X1 (DISPOSABLE) IMPLANT
PUTTY 10ML ACTIFUSE ABX (Putty) ×2 IMPLANT
RASP 3.0MM (RASP) ×1 IMPLANT
ROD PREBENT 6.35X50 (Rod) ×2 IMPLANT
SCREW DANEK NONBREAK (Screw) ×2 IMPLANT
SCREW PEDICLE VA L635 7.5X55M (Screw) ×2 IMPLANT
SCREW SET BREAK OFF (Screw) ×2 IMPLANT
SPONGE GAUZE 4X4 12PLY (GAUZE/BANDAGES/DRESSINGS) ×2 IMPLANT
SPONGE LAP 4X18 X RAY DECT (DISPOSABLE) IMPLANT
SPONGE NEURO XRAY DETECT 1X3 (DISPOSABLE) IMPLANT
SPONGE SURGIFOAM ABS GEL 100 (HEMOSTASIS) ×2 IMPLANT
STRIP CLOSURE SKIN 1/2X4 (GAUZE/BANDAGES/DRESSINGS) ×2 IMPLANT
SUT VIC AB 1 CT1 18XBRD ANBCTR (SUTURE) ×2 IMPLANT
SUT VIC AB 1 CT1 8-18 (SUTURE) ×4
SUT VIC AB 2-0 CP2 18 (SUTURE) ×4 IMPLANT
SYR 20CC LL (SYRINGE) ×1 IMPLANT
SYR 20ML ECCENTRIC (SYRINGE) ×2 IMPLANT
TAPE CLOTH SURG 4X10 WHT LF (GAUZE/BANDAGES/DRESSINGS) ×1 IMPLANT
TAPE STRIPS DRAPE STRL (GAUZE/BANDAGES/DRESSINGS) ×1 IMPLANT
TOWEL OR 17X24 6PK STRL BLUE (TOWEL DISPOSABLE) ×2 IMPLANT
TOWEL OR 17X26 10 PK STRL BLUE (TOWEL DISPOSABLE) ×2 IMPLANT
TRAY FOLEY CATH 14FRSI W/METER (CATHETERS) ×2 IMPLANT
WATER STERILE IRR 1000ML POUR (IV SOLUTION) ×2 IMPLANT

## 2012-12-27 NOTE — H&P (Signed)
Subjective: The patient is a 62 year old white male on whom I've previously performed at L4-5 fusion. The patient has done well for years but has developed recurrent back, buttock, and leg pain consistent with neurogenic claudication. He has failed medical management and was worked up with a lumbar MRI and lumbar x-rays. This demonstrated severe stenosis at L3-4. I discussed the various treatment options including surgery. The patient has weighed the risks, benefits, and alternatives to surgery and decided proceed with an L3-4 decompression, instrumentation, and fusion.   Past Medical History  Diagnosis Date  . Hypertension     takes Amlodipine and Catapres daily  . Hyperlipidemia     takes Lovastatin daily  . Sleep apnea   . History of bronchitis     last time at least 21yrs ago  . Pneumonia     last time about 61yrs ago  . Arthritis   . Chronic back pain     radiculopathy and stenosis  . History of colon polyps   . H/O hiatal hernia   . Urinary frequency   . Peripheral edema     takes Lasix daily  . Nocturia   . Diabetes mellitus     Levemir nightly ;type 2    Past Surgical History  Procedure Laterality Date  . Right leg surgery      pin in place  . Neuroplasty / transposition median nerve at carpal tunnel bilateral    . Back surgery    . Right wrist surgery    . Left knee surgery    . Hemorrhoid surgery    . Colonoscopy    . Esophagogastroduodenoscopy      Allergies  Allergen Reactions  . Lisinopril Other (See Comments)    Increased potassium level   . Victoza [Liraglutide] Diarrhea, Nausea And Vomiting and Swelling  . Meloxicam Nausea And Vomiting    History  Substance Use Topics  . Smoking status: Current Every Day Smoker -- 0.50 packs/day for 40 years    Types: Cigarettes  . Smokeless tobacco: Never Used  . Alcohol Use: No    History reviewed. No pertinent family history. Prior to Admission medications   Medication Sig Start Date End Date Taking?  Authorizing Provider  amLODipine (NORVASC) 10 MG tablet Take 10 mg by mouth daily.   Yes Historical Provider, MD  aspirin EC 81 MG tablet Take 81 mg by mouth daily.   Yes Historical Provider, MD  calcitRIOL (ROCALTROL) 0.25 MCG capsule Take 0.25 mcg by mouth daily.   Yes Historical Provider, MD  Calcium Carbonate-Vitamin D (CALCIUM + D PO) Take 2 tablets by mouth daily.   Yes Historical Provider, MD  cloNIDine (CATAPRES) 0.3 MG tablet Take 0.3 mg by mouth 2 (two) times daily.   Yes Historical Provider, MD  furosemide (LASIX) 80 MG tablet Take 40-80 mg by mouth 2 (two) times daily.   Yes Historical Provider, MD  HYDROcodone-acetaminophen (NORCO) 10-325 MG per tablet Take 1 tablet by mouth every 6 (six) hours as needed.   Yes Historical Provider, MD  insulin detemir (LEVEMIR) 100 UNIT/ML injection Inject 53 Units into the skin at bedtime.   Yes Historical Provider, MD  lovastatin (MEVACOR) 20 MG tablet Take 20 mg by mouth daily.   Yes Historical Provider, MD     Review of Systems  Positive ROS: As above  All other systems have been reviewed and were otherwise negative with the exception of those mentioned in the HPI and as above.  Objective: Vital signs in  last 24 hours: Temp:  [97.7 F (36.5 C)] 97.7 F (36.5 C) (12/18 0749) Pulse Rate:  [55] 55 (12/18 0749) Resp:  [16] 16 (12/18 0749) BP: (129)/(63) 129/63 mmHg (12/18 0749) SpO2:  [98 %] 98 % (12/18 0749)  General Appearance: Alert, cooperative, no distress, appears stated age Head: Normocephalic, without obvious abnormality, atraumatic Eyes: PERRL, conjunctiva/corneas clear, EOM's intact, fundi benign, both eyes      Ears: Normal TM's and external ear canals, both ears Throat: Lips, mucosa, and tongue normal; teeth and gums normal Neck: Supple, symmetrical, trachea midline, no adenopathy; thyroid: No enlargement/tenderness/nodules; no carotid bruit or JVD Back: Symmetric, no curvature, ROM normal, no CVA tenderness. The patient's  lumbar incision is well-healed. Lungs: Clear to auscultation bilaterally, respirations unlabored Heart: Regular rate and rhythm, S1 and S2 normal, no murmur, rub or gallop Abdomen: Soft, non-tender, bowel sounds active all four quadrants, no masses, no organomegaly Extremities: Extremities normal, atraumatic, no cyanosis or edema Pulses: 2+ and symmetric all extremities Skin: Skin color, texture, turgor normal, no rashes or lesions  NEUROLOGIC:   Mental status: alert and oriented, no aphasia, good attention span, Fund of knowledge/ memory ok Motor Exam - grossly normal Sensory Exam - grossly normal Reflexes:  Coordination - grossly normal Gait - grossly normal Balance - grossly normal Cranial Nerves: I: smell Not tested  II: visual acuity  OS: Normal    OD: Normal   II: visual fields Full to confrontation  II: pupils Equal, round, reactive to light  III,VII: ptosis None  III,IV,VI: extraocular muscles  Full ROM  V: mastication Normal  V: facial light touch sensation  Normal  V,VII: corneal reflex  Present  VII: facial muscle function - upper  Normal  VII: facial muscle function - lower Normal  VIII: hearing Not tested  IX: soft palate elevation  Normal  IX,X: gag reflex Present  XI: trapezius strength  5/5  XI: sternocleidomastoid strength 5/5  XI: neck flexion strength  5/5  XII: tongue strength  Normal    Data Review Lab Results  Component Value Date   WBC 7.1 12/20/2012   HGB 11.0* 12/20/2012   HCT 31.4* 12/20/2012   MCV 87.2 12/20/2012   PLT 139* 12/20/2012   Lab Results  Component Value Date   NA 139 12/20/2012   K 4.5 12/20/2012   CL 107 12/20/2012   CO2 18* 12/20/2012   BUN 69* 12/20/2012   CREATININE 4.20* 12/20/2012   GLUCOSE 312* 12/20/2012   No results found for this basename: INR, PROTIME    Assessment/Plan: L3-4 disc degeneration, spondylosis, stenosis, lumbar radiculopathy, lumbago, neurogenic claudication: I have discussed situation with the  patient. I have reviewed his imaging studies with them and pointed out the abnormalities. We have discussed the various treatment options including surgery. I described the surgical treatment option of L3-4 decompression, instrumentation, and fusion. I have shown him surgical models. We have discussed the risks, benefits, alternatives, and likelihood of achieving our goals with surgery. I have answered all the patient's questions. He wants to proceed with surgery.   Laya Letendre D 12/27/2012 11:57 AM

## 2012-12-27 NOTE — Anesthesia Preprocedure Evaluation (Addendum)
Anesthesia Evaluation  Patient identified by MRN, date of birth, ID band Patient awake    Reviewed: Allergy & Precautions, H&P , NPO status , Patient's Chart, lab work & pertinent test results  Airway Mallampati: II  Neck ROM: full    Dental   Pulmonary sleep apnea , Current Smoker,          Cardiovascular hypertension, Pt. on medications     Neuro/Psych    GI/Hepatic hiatal hernia,   Endo/Other  diabetes, Type 2obese  Renal/GU Renal InsufficiencyRenal disease     Musculoskeletal  (+) Arthritis -,   Abdominal   Peds  Hematology   Anesthesia Other Findings   Reproductive/Obstetrics                          Anesthesia Physical Anesthesia Plan  ASA: II  Anesthesia Plan: General   Post-op Pain Management:    Induction: Intravenous  Airway Management Planned: Oral ETT  Additional Equipment:   Intra-op Plan:   Post-operative Plan: Extubation in OR  Informed Consent: I have reviewed the patients History and Physical, chart, labs and discussed the procedure including the risks, benefits and alternatives for the proposed anesthesia with the patient or authorized representative who has indicated his/her understanding and acceptance.     Plan Discussed with: CRNA, Anesthesiologist and Surgeon  Anesthesia Plan Comments:         Anesthesia Quick Evaluation

## 2012-12-27 NOTE — Preoperative (Signed)
Beta Blockers   Reason not to administer Beta Blockers:Not Applicable 

## 2012-12-27 NOTE — Progress Notes (Signed)
Patient admitted from PACU this evening stable, Alert and oriented. Incision CDI, with foley and Hemovac in place. Oriented to the room and call bell given. Family at bedside as well.

## 2012-12-27 NOTE — Op Note (Signed)
Brief history: The patient is a 62 year old white male who I performed an L4-5 and L5-S1 decompression instrumentation and fusion on many years ago. He has had chronic back pain but he has developed recurrent back and leg pain consistent with neurogenic claudication. He failed medical management and was worked up with a lumbar MRI. This demonstrated severe spinal stenosis at L3-4. I discussed the various treatment options with the patient including surgery. He has weighed the risks, benefits, and alternatives surgery and decided proceed with a exploration of his lumbar fusion and L3-4 decompression, instrumentation, and fusion.  Preoperative diagnosis: L3-4 Degenerative disc disease, spinal stenosis compressing both the L3 and the L4 nerve roots; lumbago; lumbar radiculopathy  Postoperative diagnosis: The same  Procedure: Bilateral L3 Laminotomy/foraminotomies to decompress the bilateral L3 and L4 nerve roots(the work required to do this was in addition to the work required to do the posterior lumbar interbody fusion because of the patient's spinal stenosis, facet arthropathy. Etc. requiring a wide decompression of the nerve roots.); L3-4 posterior lumbar interbody fusion with local morselized autograft bone and Actifusebone graft extender; insertion of interbody prosthesis at L3-4 (globus peek interbody prosthesis); posterior nonsegmental instrumentation from L3 to L4 with globus titanium pedicle screws and rods; posterior lateral arthrodesis at L3-4 with local morselized autograft bone and Vitoss bone graft extender: Exploration of lumbar fusion/removal of old instrumentation  Surgeon: Dr. Earle Gell  Asst.: Dr. Sherley Bounds  Anesthesia: Gen. endotracheal  Estimated blood loss: 250 cc  Drains: One medium Hemovac  Complications: None  Description of procedure: The patient was brought to the operating room by the anesthesia team. General endotracheal anesthesia was induced. The patient was  turned to the prone position on the Wilson frame. The patient's lumbosacral region was then prepared with Betadine scrub and Betadine solution. Sterile drapes were applied.  I then injected the area to be incised with Marcaine with epinephrine solution. I then used the scalpel to make a linear midline incision over the L3-4 interspace, incising through the patient's old surgical scar. I then used electrocautery to perform a bilateral subperiosteal dissection exposing the spinous process and lamina of L2, L3, L4 and L5. We exposed the old hardware from L4-S1 using electrocautery.  We then inserted the Verstrac retractor to provide exposure. We began by exploring the old fusion. I was able to remove the upper cross connector and the old screw caps from the L4 pedicle screws. The pedicle screws and rods at L5 and S1 were covered in bone. I therefore decided to leave them in place and cut the rods just cephalad to the L5 pedicle screws we then removed the flat aspect of the rod from the pedicle screws at L4. The arthrodesis at L4-5 and L5-S1 appeared solid.  I began the decompression by using the high speed drill to perform laminotomies at L3 bilaterally. We then used the Kerrison punches to widen the laminotomy and removed the ligamentum flavum at L3-4 and removed the cephalad aspect of the L4 lamina. We used the Kerrison punches to remove the medial facets at L3-4. We performed wide foraminotomies about the bilateral L3 and L4 nerve roots completing the decompression.  We now turned our attention to the posterior lumbar interbody fusion. I used a scalpel to incise the intervertebral disc at L3-4. I then performed a partial intervertebral discectomy at L3-4 using the pituitary forceps. We prepared the vertebral endplates at [id number] for the fusion by removing the soft tissues with the curettes. We then used the trial  spacers to pick the appropriate sized interbody prosthesis. We prefilled his prosthesis with a  combination of local morselized autograft bone that we obtained during the decompression as well as Actifuse bone graft extender. We inserted the prefilled prosthesis into the interspace at L3-4. There was a good snug fit of the prosthesis in the interspace. We then filled and the remainder of the intervertebral disc space with local morselized autograft bone and Actifuse. This completed the posterior lumbar interbody arthrodesis.  We now turned attention to the instrumentation. Under fluoroscopic guidance we cannulated the bilateral L3 pedicles with the bone probe. We then removed the bone probe. We then tapped the pedicle with a 0.5 millimeter tap. We then removed the tap. We probed inside the tapped pedicle with a ball probe to rule out cortical breaches. We then inserted a 7.5 x 55 millimeter pedicle screw into the 3 pedicles bilaterally under fluoroscopic guidance. We then palpated along the medial aspect of the pedicles to rule out cortical breaches. There were none. The nerve roots were not injured. We then connected the unilateral pedicle screws from L3-L4 with a lordotic rod. We compressed the construct and secured the rod in place with the caps. We then tightened the caps appropriately. This completed the instrumentation from L3-4.  We now turned our attention to the posterior lateral arthrodesis at L3-4. We used the high-speed drill to decorticate the remainder of the facets, pars, transverse process at L3-4. We then applied a combination of local morselized autograft bone and Vitoss bone graft extender over these decorticated posterior lateral structures. This completed the posterior lateral arthrodesis.  We then obtained hemostasis using bipolar electrocautery. We irrigated the wound out with bacitracin solution. We inspected the thecal sac and nerve roots and noted they were well decompressed. We then removed the retractor. We placed a medium Hemovac drain in the epidural space and tunneled out  through separate stab wound. We reapproximated patient's thoracolumbar fascia with interrupted #1 Vicryl suture. We reapproximated patient's subcutaneous tissue with interrupted 2-0 Vicryl suture. The reapproximated patient's skin with Steri-Strips and benzoin. The wound was then coated with bacitracin ointment. A sterile dressing was applied. The drapes were removed. The patient was subsequently returned to the supine position where they were extubated by the anesthesia team. He was then transported to the post anesthesia care unit in stable condition. All sponge instrument and needle counts were reportedly correct at the end of this case.

## 2012-12-27 NOTE — Progress Notes (Signed)
Patient ID: Jon [name], male   DOB: [birth year], 62 y.o.   MRN: [id number] Subjective:  The patient is somnolent but easily arousable. He is in no apparent distress.  Objective: Vital signs in last 24 hours: Temp:  [97.7 F (36.5 C)-97.8 F (36.6 C)] 97.8 F (36.6 C) (12/18 1639) Pulse Rate:  [55] 55 (12/18 0749) Resp:  [16] 16 (12/18 0749) BP: (129)/(63) 129/63 mmHg (12/18 0749) SpO2:  [98 %] 98 % (12/18 0749)  Intake/Output from previous day:   Intake/Output this shift: Total I/O In: 2250 [I.V.:2000; IV Piggyback:250] Out: 650 [Urine:400; Blood:250]  Physical exam the patient is somnolent but arousable. He is moving his lower extremities well.  Lab Results: No results found for this basename: WBC, HGB, HCT, PLT,  in the last 72 hours BMET No results found for this basename: NA, K, CL, CO2, GLUCOSE, BUN, CREATININE, CALCIUM,  in the last 72 hours  Studies/Results: Dg Lumbar Spine 2-3 Views  12/27/2012   CLINICAL DATA:  L3-4 PLIF extension  EXAM: LUMBAR SPINE - 2-3 VIEW; DG C-ARM 1-60 MIN  COMPARISON:  Lumbar myelogram 11/14/2012  FINDINGS: C-arm images reveal placement of bilateral pedicle screws at L3. Previously existing pedicle screw fusion L4-5 and L5-S1 remain in place. Interbody spacer at L3-4 in good position.  IMPRESSION: Extension of lumbar fusion to include L3-4.   Electronically Signed   By: Franchot Gallo M.D.   On: 12/27/2012 15:53   Dg C-arm 1-60 Min  12/27/2012   CLINICAL DATA:  L3-4 PLIF extension  EXAM: LUMBAR SPINE - 2-3 VIEW; DG C-ARM 1-60 MIN  COMPARISON:  Lumbar myelogram 11/14/2012  FINDINGS: C-arm images reveal placement of bilateral pedicle screws at L3. Previously existing pedicle screw fusion L4-5 and L5-S1 remain in place. Interbody spacer at L3-4 in good position.  IMPRESSION: Extension of lumbar fusion to include L3-4.   Electronically Signed   By: Franchot Gallo M.D.   On: 12/27/2012 15:53    Assessment/Plan: The patient is doing well.  LOS: 0  days     [name] 12/27/2012, 4:56 PM

## 2012-12-27 NOTE — Anesthesia Postprocedure Evaluation (Signed)
  Anesthesia Post-op Note  Patient: Jon [name]  Procedure(s) Performed: Procedure(s) with comments: POSTERIOR LUMBAR FUSION 1 LEVEL (N/A) - L34 posterior lumbar interbody fusion with interbody prosthesis posterior lateral arthrodesis and posterior nonsegmental instrumentation with exploration of L4-S1 fusion  Patient Location: PACU  Anesthesia Type:General  Level of Consciousness: awake, alert  and oriented  Airway and Oxygen Therapy: Patient Spontanous Breathing and Patient connected to face mask oxygen  Post-op Pain: mild  Post-op Assessment: Post-op Vital signs reviewed  Post-op Vital Signs: Reviewed  Complications: No apparent anesthesia complications

## 2012-12-27 NOTE — Transfer of Care (Signed)
Immediate Anesthesia Transfer of Care Note  Patient: TYSIN RAREY  Procedure(s) Performed: Procedure(s) with comments: POSTERIOR LUMBAR FUSION 1 LEVEL (N/A) - L34 posterior lumbar interbody fusion with interbody prosthesis posterior lateral arthrodesis and posterior nonsegmental instrumentation with exploration of L4-S1 fusion  Patient Location: PACU  Anesthesia Type:General  Level of Consciousness: awake, alert , oriented and patient cooperative  Airway & Oxygen Therapy: Patient Spontanous Breathing and Patient connected to nasal cannula oxygen  Post-op Assessment: Report given to PACU RN, Post -op Vital signs reviewed and stable and Patient moving all extremities  Post vital signs: Reviewed and stable  Complications: No apparent anesthesia complications

## 2012-12-27 NOTE — Anesthesia Procedure Notes (Signed)
Procedure Name: Intubation Date/Time: 12/27/2012 12:10 PM Performed by: Ned Grace Pre-anesthesia Checklist: Patient identified, Patient being monitored, Emergency Drugs available, Timeout performed and Suction available Patient Re-evaluated:Patient Re-evaluated prior to inductionOxygen Delivery Method: Circle system utilized Preoxygenation: Pre-oxygenation with 100% oxygen Intubation Type: IV induction Ventilation: Two handed mask ventilation required and Oral airway inserted - appropriate to patient size Laryngoscope Size: Mac and 4 Grade View: Grade I Tube type: Oral Tube size: 7.5 mm Number of attempts: 1 Airway Equipment and Method: Stylet and Oral airway Placement Confirmation: ETT inserted through vocal cords under direct vision,  positive ETCO2 and breath sounds checked- equal and bilateral Secured at: 23 cm Tube secured with: Tape Dental Injury: Teeth and Oropharynx as per pre-operative assessment  Comments: Smooth IV induction by Dr Marcie Bal; 2 handed MV with OPA required for Vt>100; easy atraumatic intubation by [place].

## 2012-12-28 LAB — GLUCOSE, CAPILLARY
Glucose-Capillary: 66 mg/dL — ABNORMAL LOW (ref 70–99)
Glucose-Capillary: 72 mg/dL (ref 70–99)
Glucose-Capillary: 126 mg/dL — ABNORMAL HIGH (ref 70–99)
Glucose-Capillary: 198 mg/dL — ABNORMAL HIGH (ref 70–99)
Glucose-Capillary: 172 mg/dL — ABNORMAL HIGH (ref 70–99)
Glucose-Capillary: 161 mg/dL — ABNORMAL HIGH (ref 70–99)

## 2012-12-28 LAB — CBC
HCT: 26.3 % — ABNORMAL LOW (ref 39.0–52.0)
MCH: 30.7 pg (ref 26.0–34.0)
MCHC: 34.2 g/dL (ref 30.0–36.0)
MCV: 89.8 fL (ref 78.0–100.0)
Platelets: 129 10*3/uL — ABNORMAL LOW (ref 150–400)
RDW: 13.2 % (ref 11.5–15.5)

## 2012-12-28 LAB — BASIC METABOLIC PANEL
BUN: 54 mg/dL — ABNORMAL HIGH (ref 6–23)
Calcium: 7.4 mg/dL — ABNORMAL LOW (ref 8.4–10.5)
Creatinine, Ser: 4.01 mg/dL — ABNORMAL HIGH (ref 0.50–1.35)
GFR calc Af Amer: 17 mL/min — ABNORMAL LOW (ref >90)
GFR calc non Af Amer: 15 mL/min — ABNORMAL LOW (ref >90)
Potassium: 4.9 mEq/L (ref 3.5–5.1)

## 2012-12-28 MED ORDER — INSULIN ASPART 100 UNIT/ML ~~LOC~~ SOLN
0.0000 [IU] | Freq: Three times a day (TID) | SUBCUTANEOUS | Status: DC
  Administered 2012-12-28: 3 [IU] via SUBCUTANEOUS
  Administered 2012-12-28: 2 [IU] via SUBCUTANEOUS
  Administered 2012-12-29: 3 [IU] via SUBCUTANEOUS

## 2012-12-28 MED ORDER — MORPHINE SULFATE 2 MG/ML IJ SOLN
2.0000 mg | INTRAMUSCULAR | Status: DC | PRN
  Administered 2012-12-28: 4 mg via INTRAVENOUS
  Filled 2012-12-28: qty 2

## 2012-12-28 MED FILL — Sodium Chloride IV Soln 0.9%: INTRAVENOUS | Qty: 1000 | Status: AC

## 2012-12-28 MED FILL — Heparin Sodium (Porcine) Inj 1000 Unit/ML: INTRAMUSCULAR | Qty: 30 | Status: AC

## 2012-12-28 NOTE — Progress Notes (Signed)
UR completed 

## 2012-12-28 NOTE — Clinical Documentation Improvement (Signed)
THIS DOCUMENT IS NOT A PERMANENT PART OF THE MEDICAL RECORD  Please update your documentation with the medical record to reflect your response to this query. If you need help knowing how to do this please call [phone].  12/28/12   Dear Dr.Jenkins/Associates,  In a better effort to capture your patient's severity of illness, reflect appropriate length of stay and utilization of resources, a review of the patient medical record has revealed the following indicators.    Based on your clinical judgment, please clarify and document in a progress note and/or discharge summary the clinical condition associated with the following supporting information:  In responding to this query please exercise your independent judgment.  The fact that a query is asked, does not imply that any particular answer is desired or expected.   Possible Clinical Conditions?   CKD Stage IV - GFR 15-29  CKD Stage V - GFR < 15  ESRD (End Stage Renal Disease)  Other condition  Cannot Clinically determine     Risk Factors:  Patient with a history of CKD per 12/21/12 Anesthesiology note.   Lab: 12/19: Bun:  54 Creat: 4.01 GFR:  15  You may use possible, probable, or suspect with inpatient documentation. possible, probable, suspected diagnoses MUST be documented at the time of discharge  Reviewed:    Additional documentation provided 01/14/13 per Dr. Arnoldo Morale.                                       Thank You,  Theron Arista, Clinical Documentation Specialist: [place]

## 2012-12-28 NOTE — Evaluation (Signed)
Occupational Therapy Evaluation Patient Details Name: Jon [name] MRN: [id number] DOB: 23-Jan-1950 Today's Date: 12/28/2012 Time: 1010-1043 OT Time Calculation (min): 33 min  OT Assessment / Plan / Recommendation History of present illness 62 yo male s/p L3-4 PLIF   Clinical Impression   Patient is s/p PLIF L3-4 surgery resulting in functional limitations due to the deficits listed below (see OT problem list).  Patient will benefit from skilled OT acutely to increase independence and safety with ADLS to allow discharge home. No DME at this time required. No brace on evaluation so EOB performed only.     OT Assessment  Patient needs continued OT Services    Follow Up Recommendations  No OT follow up    Barriers to Discharge      Equipment Recommendations  None recommended by OT    Recommendations for Other Services    Frequency  Min 2X/week    Precautions / Restrictions Precautions Precautions: Back Precaution Comments: back handout provided and reviewed in detail. Pt able to recall 2 out 3 precautions at end of session. education reviewed to help reinforce using teach back Required Braces or Orthoses: Spinal Brace Spinal Brace: Lumbar corset;Applied in sitting position (no brace present on eval. EOB sitting only performed)   Pertinent Vitals/Pain 4 out 10  decr with sitting no number provided states "oh that feels even better"    ADL  Eating/Feeding: Modified independent Where Assessed - Eating/Feeding: Edge of bed Grooming: Wash/dry face;Teeth care;Denture care;Modified independent Where Assessed - Grooming: Unsupported sitting Lower Body Dressing: Maximal assistance Where Assessed - Lower Body Dressing: Unsupported sitting Transfers/Ambulation Related to ADLs: no brace so EOB sitting all that was performed ADL Comments: Pt without brace so back education and dangle at EOB performed this session. Pt very grateful to sit EOB . pt educated on oral care and allowed to  perform grooming. Pt reports decr pain with mobility. Pt demonstrates good return demo of bed mobility    OT Diagnosis: Generalized weakness;Acute pain  OT Problem List: Decreased strength;Decreased activity tolerance;Impaired balance (sitting and/or standing);Decreased safety awareness;Decreased knowledge of use of DME or AE;Decreased knowledge of precautions;Pain OT Treatment Interventions: Self-care/ADL training;Therapeutic exercise;DME and/or AE instruction;Therapeutic activities;Patient/family education;Balance training   OT Goals(Current goals can be found in the care plan section) Acute Rehab OT Goals Patient Stated Goal: to return home OT Goal Formulation: With patient/family Time For Goal Achievement: 01/11/13 Potential to Achieve Goals: Good  Visit Information  Last OT Received On: 12/28/12 Assistance Needed: +1 History of Present Illness: 62 yo male s/p L3-4 PLIF       Prior [place] expects to be discharged to:: Private residence Living Arrangements: Spouse/significant other Available Help at Discharge: Family;Available 24 hours/day Type of Home: House Home Access: Ramped entrance Home Layout: One level Home Equipment: Bedside commode;Walker - 2 wheels;Cane - single point;Crutches Prior Function Level of Independence: Independent Communication Communication: No difficulties Dominant Hand: Right         Vision/Perception Vision - History Baseline Vision: Wears glasses all the time Patient Visual Report: No change from baseline   Cognition  Cognition Arousal/Alertness: Awake/alert Behavior During Therapy: WFL for tasks assessed/performed Overall Cognitive Status: Within Functional Limits for tasks assessed    Extremity/Trunk Assessment Upper Extremity Assessment Upper Extremity Assessment: Overall WFL for tasks assessed Lower Extremity Assessment Lower Extremity Assessment: Defer to PT evaluation Cervical / Trunk  Assessment Cervical / Trunk Assessment: Normal     Mobility Bed Mobility Bed  Mobility: Rolling Left;Left Sidelying to Sit;Supine to Sit;Sitting - Scoot to Edge of Bed;Sit to Supine Rolling Left: 4: Min guard;With rail Left Sidelying to Sit: 4: Min guard;HOB flat;HOB elevated Supine to Sit: 4: Min guard;HOB elevated;With rails Sitting - Scoot to Edge of Bed: 4: Min guard Sit to Supine: 4: Min guard;With rail Details for Bed Mobility Assistance: pt with good return demo and sequence on first attempt Transfers Transfers: Not assessed     Exercise     Balance     End of Session OT - End of Session Activity Tolerance: Patient tolerated treatment well Patient left: in bed;with call bell/phone within reach;with bed alarm set;with family/visitor present Nurse Communication: Mobility status;Precautions  GO     Peri Maris 12/28/2012, 11:29 AM Pager: [phone]

## 2012-12-28 NOTE — Progress Notes (Signed)
Pt ambulated to front bay windows, looked out fountain in front of unit and back to room with walker and assist x1 with nurse. Pt tolerated well, but L knee locked up three times during ambulation. Will continue to monitor. [place], Fredrico Beedle N, RN

## 2012-12-28 NOTE — Progress Notes (Signed)
Patient ID: Jon [name], male   DOB: 11-01-1950, 62 y.o.   MRN: [id number] Subjective:  The patient is alert and pleasant. His back is appropriately sore.  Objective: Vital signs in last 24 hours: Temp:  [97.3 F (36.3 C)-99.5 F (37.5 C)] 99.3 F (37.4 C) (12/19 0634) Pulse Rate:  [52-86] 86 (12/19 0634) Resp:  [13-18] 17 (12/19 0634) BP: (129-170)/(61-79) 153/69 mmHg (12/19 0634) SpO2:  [93 %-100 %] 97 % (12/19 0634) Weight:  [106.5 kg (234 lb 12.6 oz)] 106.5 kg (234 lb 12.6 oz) (12/18 1835)  Intake/Output from previous day: 12/18 0701 - 12/19 0700 In: 2250 [I.V.:2000; IV Piggyback:250] Out: 2500 [Urine:1800; Drains:450; Blood:250] Intake/Output this shift:    Physical exam the patient is alert and oriented. His lower extremity strength is normal. His dressing is clean and dry.  Lab Results: No results found for this basename: WBC, HGB, HCT, PLT,  in the last 72 hours BMET No results found for this basename: NA, K, CL, CO2, GLUCOSE, BUN, CREATININE, CALCIUM,  in the last 72 hours  Studies/Results: Dg Lumbar Spine 2-3 Views  12/27/2012   CLINICAL DATA:  L3-4 PLIF extension  EXAM: LUMBAR SPINE - 2-3 VIEW; DG C-ARM 1-60 MIN  COMPARISON:  Lumbar myelogram 11/14/2012  FINDINGS: C-arm images reveal placement of bilateral pedicle screws at L3. Previously existing pedicle screw fusion L4-5 and L5-S1 remain in place. Interbody spacer at L3-4 in good position.  IMPRESSION: Extension of lumbar fusion to include L3-4.   Electronically Signed   By: Franchot Gallo M.D.   On: 12/27/2012 15:53   Dg C-arm 1-60 Min  12/27/2012   CLINICAL DATA:  L3-4 PLIF extension  EXAM: LUMBAR SPINE - 2-3 VIEW; DG C-ARM 1-60 MIN  COMPARISON:  Lumbar myelogram 11/14/2012  FINDINGS: C-arm images reveal placement of bilateral pedicle screws at L3. Previously existing pedicle screw fusion L4-5 and L5-S1 remain in place. Interbody spacer at L3-4 in good position.  IMPRESSION: Extension of lumbar fusion to include  L3-4.   Electronically Signed   By: Franchot Gallo M.D.   On: 12/27/2012 15:53    Assessment/Plan: Postop day 1: The patient is doing well. We will mobilize him with PT and OT. I will discontinue the PCA pump. We will plan to DC his Hemovac drain tomorrow. He will likely go home over the weekend. I gave him his discharge instructions and answered all his questions.  LOS: 1 day     Renae Mottley D 12/28/2012, 7:48 AM

## 2012-12-28 NOTE — Progress Notes (Signed)
Hypoglycemic Event  CBG: 66   Treatment: 15 GM carbohydrate snack  Symptoms: Pale and None  Follow-up CBG: Time:0737 CBG Result:72  Possible Reasons for Event: Unknown  Comments/MD notified: Dr. Arnoldo Morale notified    Jon [name], Jon [name]  Remember to initiate Hypoglycemia Order Set & complete

## 2012-12-28 NOTE — Progress Notes (Signed)
Inpatient Diabetes Program Recommendations  AACE/ADA: New Consensus Statement on Inpatient Glycemic Control (2013)  Target Ranges:  Prepandial:   less than 140 mg/dL      Peak postprandial:   less than 180 mg/dL (1-2 hours)      Critically ill patients:  140 - 180 mg/dL  Results for Jon [name], Jon [name] (MRN [id number]) as of 12/28/2012 13:58  Ref. Range 12/27/2012 16:39 12/27/2012 18:45 12/27/2012 22:09 12/28/2012 07:04 12/28/2012 07:37 12/28/2012 11:42  Glucose-Capillary Latest Range: 70-99 mg/dL 112 (H) 113 (H) 104 (H) 66 (L) 72 126 (H)   Inpatient Diabetes Program Recommendations Insulin - Basal: Decrease Levemir to 45 units at HS Thank you  Raoul Pitch BSN, RN,CDE Inpatient Diabetes Coordinator [phone] (team pager)

## 2012-12-28 NOTE — Progress Notes (Signed)
Pt urinated 200cc around 2200, bladder scan showed 453cc. Pt states he is not uncomfortable, and says he would like to wait on intermittent straight cath for now. Will continue to monitor.

## 2012-12-28 NOTE — Evaluation (Signed)
Physical Therapy Evaluation Patient Details Name: Jon [name] MRN: [id number] DOB: 11-14-1950 Today's Date: 12/28/2012 Time: [id number] PT Time Calculation (min): 22 min  PT Assessment / Plan / Recommendation History of Present Illness  62 yo male s/p L3-4 PLIF  Clinical Impression  Pt very motivated and anticipate great progress.  Pt attempted to stand at toilet to urinate without success.  Will continue to follow.      PT Assessment  Patient needs continued PT services    Follow Up Recommendations  Home health PT;Supervision - Intermittent    Does the patient have the potential to tolerate intense rehabilitation      Barriers to Discharge        Equipment Recommendations  None recommended by PT    Recommendations for Other Services     Frequency Min 5X/week    Precautions / Restrictions Precautions Precautions: Back Precaution Booklet Issued: Yes (comment) Precaution Comments: pt verbalized 2/3 back precautions.   Required Braces or Orthoses: Spinal Brace Spinal Brace: Lumbar corset;Applied in sitting position (no brace present on eval. EOB sitting only performed) Restrictions Weight Bearing Restrictions: No   Pertinent Vitals/Pain "Not like it was before surgery"      Mobility  Bed Mobility Bed Mobility: Rolling Left;Left Sidelying to Sit;Sitting - Scoot to Edge of Bed Rolling Left: 5: Supervision;With rail Left Sidelying to Sit: 5: Supervision;With rails Sitting - Scoot to Edge of Bed: 5: Supervision Details for Bed Mobility Assistance: pt demos good log roll and return demo from OT session earlier in day.   Transfers Transfers: Sit to Stand;Stand to Sit Sit to Stand: 4: Min guard;With upper extremity assist;From bed Stand to Sit: 4: Min guard;With upper extremity assist;To chair/3-in-1 Details for Transfer Assistance: cues for UE use and controlling descent to chair.   Ambulation/Gait Ambulation/Gait Assistance: 4: Min guard Ambulation Distance  (Feet): 160 Feet Assistive device: Rolling walker Ambulation/Gait Assistance Details: pt moves slowly, but demos good use of RW.  cues for upright posture.  pt tends to drag L foot and somewhat supinated.   Gait Pattern: Step-through pattern;Decreased stride length;Trunk flexed;Decreased dorsiflexion - left Stairs: No Wheelchair Mobility Wheelchair Mobility: No    Exercises     PT Diagnosis: Difficulty walking  PT Problem List: Decreased activity tolerance;Decreased balance;Decreased mobility;Decreased knowledge of use of DME;Decreased knowledge of precautions PT Treatment Interventions: DME instruction;Gait training;Functional mobility training;Therapeutic activities;Therapeutic exercise;Balance training;Neuromuscular re-education;Patient/family education     PT Goals(Current goals can be found in the care plan section) Acute Rehab PT Goals Patient Stated Goal: to return home PT Goal Formulation: With patient Time For Goal Achievement: 01/04/13 Potential to Achieve Goals: Good  Visit Information  Last PT Received On: 12/28/12 Assistance Needed: +1 History of Present Illness: 62 yo male s/p L3-4 PLIF       Prior [place] expects to be discharged to:: Private residence Living Arrangements: Spouse/significant other Available Help at Discharge: Family;Available 24 hours/day Type of Home: House Home Access: Ramped entrance Home Layout: One level Home Equipment: Bedside commode;Walker - 2 wheels;Cane - single point;Crutches Prior Function Level of Independence: Independent Communication Communication: No difficulties Dominant Hand: Right    Cognition  Cognition Arousal/Alertness: Awake/alert Behavior During Therapy: WFL for tasks assessed/performed Overall Cognitive Status: Within Functional Limits for tasks assessed    Extremity/Trunk Assessment Upper Extremity Assessment Upper Extremity Assessment: Defer to OT evaluation Lower Extremity  Assessment Lower Extremity Assessment: Overall WFL for tasks assessed Cervical / Trunk Assessment Cervical / Trunk Assessment: Normal  Balance Balance Balance Assessed: No  End of Session PT - End of Session Equipment Utilized During Treatment: Gait belt;Back brace Activity Tolerance: Patient tolerated treatment well Patient left: in chair;with call bell/phone within reach;with family/visitor present Nurse Communication: Mobility status  GP     Catarina Hartshorn, [place] 12/28/2012, 3:00 PM

## 2012-12-28 NOTE — Progress Notes (Signed)
Orthopedic Tech Progress Note Patient Details:  Jon [name] Dec 31, 1950 [id number]  Patient ID: Jon [name], male   DOB: 10/06/1950, 62 y.o.   MRN: [id number]   [name] 12/28/2012, 10:02 AMCalled bio-tech for lumbar fusion brace.

## 2012-12-28 NOTE — Progress Notes (Signed)
Patient was due to void at 1400, requested to wait a little more time to see if he can void. At 1500, he could not void. Bladder scanned him and he was retaining more than 537mls. So he was straight cath. An output of 974mls was obtained. Will continue to monitor.

## 2012-12-29 LAB — GLUCOSE, CAPILLARY
Glucose-Capillary: 97 mg/dL (ref 70–99)
Glucose-Capillary: 149 mg/dL — ABNORMAL HIGH (ref 70–99)

## 2012-12-29 MED ORDER — TAMSULOSIN HCL 0.4 MG PO CAPS
0.8000 mg | ORAL_CAPSULE | ORAL | Status: AC
  Administered 2012-12-29: 0.8 mg via ORAL
  Filled 2012-12-29: qty 2

## 2012-12-29 MED ORDER — TAMSULOSIN HCL 0.4 MG PO CAPS
0.4000 mg | ORAL_CAPSULE | Freq: Every day | ORAL | Status: DC
Start: 2012-12-30 — End: 2012-12-30
  Administered 2012-12-30: 0.4 mg via ORAL
  Filled 2012-12-29 (×2): qty 1

## 2012-12-29 NOTE — Progress Notes (Signed)
Subjective: Patient sitting up out of bed, has been ambulating in the halls. Nursing staff reports he did require an in and out catheterization yesterday, but has been voiding relatively small volumes, and most recent bladder scan showed a PVR of 250 cc. Patient reports that prior to hospitalization he would void a full volume of urine, but now is voiding only relatively small volumes. Patient's wife describe a history of chronic renal insufficiency monitored by Dr. Mercy [name].  Objective: Vital signs in last 24 hours: Filed Vitals:   12/28/12 1808 12/28/12 2119 12/29/12 0200 12/29/12 0521  BP: 157/80 166/62 147/60 155/57  Pulse: 77 64 67 60  Temp: 98.7 F (37.1 C) 98.7 F (37.1 C) 98 F (36.7 C) 97.5 F (36.4 C)  TempSrc: Oral Oral Oral Oral  Resp: 18 18 18 18   Height:      Weight:      SpO2: 93% 94% 98% 98%    Intake/Output from previous day: 12/19 0701 - 12/20 0700 In: -  Out: 1850 [Urine:1525; Drains:325] Intake/Output this shift:    Physical Exam:  Awake alert, following commands moving all extremities well.  CBC  Recent Labs  12/28/12 0500  WBC 8.6  HGB 9.0*  HCT 26.3*  PLT 129*   BMET  Recent Labs  12/28/12 0500  NA 138  K 4.9  CL 109  CO2 21  GLUCOSE 77  BUN 54*  CREATININE 4.01*  CALCIUM 7.4*    Assessment/Plan: Will start on Flomax, and have nursing staff continue to monitor PVRs with bladder scan. We'll reassess in a.m.   Jon Spangle, MD 12/29/2012, 9:39 AM

## 2012-12-29 NOTE — Progress Notes (Signed)
Changed Pt's CPAP to 7cmH2O per pt request. Pt will place himself on CPAP. Told to call RT if he needs anything else.

## 2012-12-29 NOTE — Progress Notes (Signed)
Agree with SPT.    Ulanda Tackett, PT [phone]  

## 2012-12-29 NOTE — Progress Notes (Signed)
Occupational Therapy Treatment Patient Details Name: Jon [name] MRN: [id number] DOB: January 11, 1951 Today's Date: 12/29/2012 Time: [id number] OT Time Calculation (min): 36 min  OT Assessment / Plan / Recommendation  History of present illness 62 yo male s/p L3-4 PLIF   OT comments  Pt progressing with OT this session and educated on AE for LB dressing. Pt educated on shower transfer. Pt needed reeducation on precautions and prevent bending with sit<>Stand.   Follow Up Recommendations  No OT follow up    Barriers to Discharge       Equipment Recommendations  None recommended by OT    Recommendations for Other Services    Frequency Min 2X/week   Progress towards OT Goals Progress towards OT goals: Progressing toward goals  Plan Discharge plan remains appropriate    Precautions / Restrictions Precautions Precautions: Back Precaution Comments: pt verbalized 2/3 back precautions.  pt with poor recall and so reeducated on precautions. Required Braces or Orthoses: Spinal Brace Spinal Brace: Lumbar corset;Applied in sitting position   Pertinent Vitals/Pain Premedicated by RN Mable Fill    ADL  Eating/Feeding: Modified independent Where Assessed - Eating/Feeding: Chair Grooming: Supervision/safety;Denture care Where Assessed - Grooming: Unsupported standing Lower Body Dressing: Supervision/safety Where Assessed - Lower Body Dressing: Supported sitting (use of AE) Toilet Transfer: Supervision/safety Armed forces technical officer Method: Sit to Loss adjuster, chartered: Regular height toilet (static standing) Toileting - Clothing Manipulation and Hygiene: Supervision/safety Where Assessed - Best boy and Hygiene: Standing Tub/Shower Transfer: Minimal assistance Tub/Shower Transfer Method: Therapist, art: Walk in shower Equipment Used: Gait belt;Back brace;Rolling walker Transfers/Ambulation Related to ADLs: Pt completed bed mobility,  sit<>stand from EOB , toilet transfer and simulated shower transfer. Pt educated on side ways transfer into shower and use of 3n1 to posteriorly enter shower.  ADL Comments: Pt educated on AE for LB dressing this session and pt plans to purchase sock aid and reacher. Pt     OT Diagnosis:    OT Problem List:   OT Treatment Interventions:     OT Goals(current goals can now be found in the care plan section) Acute Rehab OT Goals Patient Stated Goal: to return home OT Goal Formulation: With patient/family Time For Goal Achievement: 01/11/13 Potential to Achieve Goals: Good ADL Goals Pt Will Perform Lower Body Dressing: with supervision;with adaptive equipment;sit to/from stand Pt Will Transfer to Toilet: with supervision;bedside commode Pt Will Perform Tub/Shower Transfer: with supervision;ambulating  Visit Information  Last OT Received On: 12/29/12 Assistance Needed: +1 History of Present Illness: 62 yo male s/p L3-4 PLIF    Subjective Data      Prior Functioning       Cognition  Cognition Arousal/Alertness: Awake/alert Behavior During Therapy: WFL for tasks assessed/performed Overall Cognitive Status: Within Functional Limits for tasks assessed    Mobility  Bed Mobility Bed Mobility: Supine to Sit;Sitting - Scoot to [employer] of Bed;Sit to Supine Supine to Sit: 4: Min guard;HOB flat Sitting - Scoot to [employer] of Bed: 4: Min guard Sit to Supine: 4: Min guard;HOB flat Details for Bed Mobility Assistance: pt required extended time and verbal cues for LB to help patient achieve bed mobility without (A) Transfers Transfers: Sit to Stand;Stand to Sit Sit to Stand: 4: Min assist;With upper extremity assist;From bed Stand to Sit: 4: Min guard;With upper extremity assist;To chair/3-in-1 Details for Transfer Assistance: cues for pushing up . pt with difficult standing without extreme hip flexion. Pt educated on preventing bending with static standing attempts  Exercises      Balance      End of Session OT - End of Session Activity Tolerance: Patient tolerated treatment well Patient left: in chair;with call bell/phone within reach Nurse Communication: Mobility status;Precautions  GO     Peri Maris 12/29/2012, 10:00 AM Pager: [phone]

## 2012-12-29 NOTE — Progress Notes (Signed)
Physical Therapy Treatment Patient Details Name: Jon [name] MRN: [id number] DOB: 10/13/1950 Today's Date: 12/29/2012 Time: [id number] PT Time Calculation (min): 15 min  PT Assessment / Plan / Recommendation  History of Present Illness 62 yo male s/p L3-4 PLIF on 12/18   PT Comments   Patient hasn't been up walking yet today, and is agreeable to therapy.  Patient provided with education about his back precautions and positions to avoid. Patient was able to increase his ambulation distance today.  Patient will benefit from skilled PT to increase their independence and safety with mobility (while adhering to their precautions) to allow discharge to home with home services.   Follow Up Recommendations  Home health PT;Supervision - Intermittent           Equipment Recommendations  None recommended by PT       Frequency Min 5X/week   Progress towards PT Goals Progress towards PT goals: Progressing toward goals  Plan Current plan remains appropriate    Precautions / Restrictions Precautions Precautions: Back Precaution Booklet Issued: Yes (comment) (In previous session) Precaution Comments: Patient able to verbalize precautions.  Reviewed again with pt and wife. Required Braces or Orthoses: Spinal Brace Spinal Brace: Lumbar corset;Applied in sitting position Restrictions Weight Bearing Restrictions: No   Pertinent Vitals/Pain Pain is 0/10 at start of session, soreness in low back.    Mobility  Bed Mobility Bed Mobility: Rolling Left;Left Sidelying to Sit Rolling Left: 5: Supervision Left Sidelying to Sit: 5: Supervision Sitting - Scoot to Edge of Bed: 5: Supervision Details for Bed Mobility Assistance: Patient able to pull with UE on rails and demonstrate proper log roll.  Next session perform without rails to pull on to emulate home environment. Transfers Transfers: Stand to Sit;Sit to Stand Sit to Stand: 4: Min guard;With upper extremity assist;From bed Stand to Sit:  4: Min guard;With upper extremity assist;To chair/3-in-1 Details for Transfer Assistance: Patient uses UE well to help stand, but still has excessive trunk flexion with standing.  Patient is aware of this and reports that it is getting better each try and he gets stronger.  Educated on strategies to avoid excessive trunk flexion with standing. Ambulation/Gait Ambulation/Gait Assistance: 4: Min guard Ambulation Distance (Feet): 250 Feet Assistive device: Rolling walker Ambulation/Gait Assistance Details: L knee feels better today that previous sessions of ambulating.  Patient continues to have slow and methodical gait, but performs this safely.  VC for upright posture. Gait Pattern: Step-through pattern;Decreased stride length General Gait Details: A for safety, he is feeling more steady on his feet Stairs: No      PT Goals (current goals can now be found in the care plan section) Acute Rehab PT Goals Patient Stated Goal: to return home PT Goal Formulation: With patient Time For Goal Achievement: 01/04/13 Potential to Achieve Goals: Good  Visit Information  Last PT Received On: 12/29/12 Assistance Needed: +1 History of Present Illness: 62 yo male s/p L3-4 PLIF on 12/18    Subjective Data  Patient Stated Goal: to return home   Cognition  Cognition Arousal/Alertness: Awake/alert Behavior During Therapy: WFL for tasks assessed/performed Overall Cognitive Status: Within Functional Limits for tasks assessed       End of Session PT - End of Session Equipment Utilized During Treatment: Gait belt;Back brace Activity Tolerance: Patient tolerated treatment well (Patient increased distance of amulation) Patient left: in chair;with call bell/phone within reach;with family/visitor present Nurse Communication: Mobility status   GP    Cordelia Poche, SPT Pager:  [phone]  Cordelia Poche 12/29/2012, 2:55 PM

## 2012-12-30 LAB — GLUCOSE, CAPILLARY

## 2012-12-30 MED ORDER — TAMSULOSIN HCL 0.4 MG PO CAPS: 0.4000 mg | ORAL_CAPSULE | Freq: Every day | ORAL | Status: DC

## 2012-12-30 MED ORDER — CYCLOBENZAPRINE HCL 10 MG PO TABS: 5.0000 mg | ORAL_TABLET | Freq: Three times a day (TID) | ORAL | Status: DC | PRN

## 2012-12-30 MED ORDER — OXYCODONE-ACETAMINOPHEN 5-325 MG PO TABS: 1.0000 | ORAL_TABLET | ORAL | Status: DC | PRN

## 2012-12-30 NOTE — Progress Notes (Signed)
Pt d/c to home by car with family. Assessment stable. Prescriptions given. Pt verbalizes understanding of d/c instructions.

## 2012-12-30 NOTE — Discharge Summary (Signed)
Physician Discharge Summary  Patient ID: Jon [name] MRN: [id number] DOB/AGE: 1950/11/11 62 y.o.  Admit date: 12/27/2012 Discharge date: 12/30/2012  Admission Diagnoses:  L3-4 Degenerative disc disease, spinal stenosis compressing both the L3 and the L4 nerve roots; lumbago; lumbar radiculopathy  Discharge Diagnoses:  L3-4 Degenerative disc disease, spinal stenosis compressing both the L3 and the L4 nerve roots; lumbago; lumbar radiculopathy  Active Problems:   Lumbar stenosis with neurogenic claudication  Discharged Condition: good  Hospital Course: Patient was admitted by Dr. Earle Gell, underwent an L3-4 lumbar decompression and arthrodesis. Postoperatively he is making good progress, ambulating actively in the halls. He was seen by PT and OT. He did have some difficulties with small-volume voids and elevated postvoid residuals, and was started on Flomax, and the urinary retention has resolved. His most recent PVR was 89 cc. He is followed by Dr. [name] at [facility] for renal insufficiency, and is scheduled to see him on January 12. We have given him a one-month prescription for Flomax, and I have instructed the patient to discuss the voiding function with Dr. [name] and together they can decide whether to continue the Flomax or not, and also whether the patient should established relationship with a urologist. His wound is healed nicely, the Hemovac drain was removed. He and his wife have been given instructions regarding wound care and activities. He is anxious to be discharged to home. He is to followup with Dr. Arnoldo Morale in about 3 weeks.  Discharge Exam: Blood pressure 134/71, pulse 64, temperature 97.4 F (36.3 C), temperature source Oral, resp. rate 18, height 5\' 11"  (1.803 m), weight 106.5 kg (234 lb 12.6 oz), SpO2 96.00%.  Disposition: Home     Medication List         amLODipine 10 MG tablet  Commonly known as:  NORVASC  Take 10 mg by mouth daily.      aspirin EC 81 MG tablet  Take 81 mg by mouth daily.     calcitRIOL 0.25 MCG capsule  Commonly known as:  ROCALTROL  Take 0.25 mcg by mouth daily.     CALCIUM + D PO  Take 2 tablets by mouth daily.     cloNIDine 0.3 MG tablet  Commonly known as:  CATAPRES  Take 0.3 mg by mouth 2 (two) times daily.     cyclobenzaprine 10 MG tablet  Commonly known as:  FLEXERIL  Take 0.5-1 tablets (5-10 mg total) by mouth 3 (three) times daily as needed for muscle spasms.     furosemide 80 MG tablet  Commonly known as:  LASIX  Take 40-80 mg by mouth 2 (two) times daily.     HYDROcodone-acetaminophen 10-325 MG per tablet  Commonly known as:  NORCO  Take 1 tablet by mouth every 6 (six) hours as needed.     insulin detemir 100 UNIT/ML injection  Commonly known as:  LEVEMIR  Inject 53 Units into the skin at bedtime.     lovastatin 20 MG tablet  Commonly known as:  MEVACOR  Take 20 mg by mouth daily.     oxyCODONE-acetaminophen 5-325 MG per tablet  Commonly known as:  PERCOCET/ROXICET  Take 1-2 tablets by mouth every 4 (four) hours as needed for moderate pain or severe pain.     tamsulosin 0.4 MG Caps capsule  Commonly known as:  FLOMAX  Take 1 capsule (0.4 mg total) by mouth daily after breakfast.         Signed: [name], MD 12/30/2012,  8:35 AM

## 2013-01-10 DIAGNOSIS — I639 Cerebral infarction, unspecified: Secondary | ICD-10-CM | POA: Insufficient documentation

## 2013-01-10 DIAGNOSIS — N189 Chronic kidney disease, unspecified: Secondary | ICD-10-CM

## 2013-01-10 DIAGNOSIS — I509 Heart failure, unspecified: Secondary | ICD-10-CM

## 2013-01-10 HISTORY — DX: Cerebral infarction, unspecified: I63.9

## 2013-01-10 HISTORY — DX: Chronic kidney disease, unspecified: N18.9

## 2013-01-10 HISTORY — DX: Heart failure, unspecified: I50.9

## 2013-01-14 NOTE — Discharge Summary (Signed)
  This is to document that patient has stage IV chronic kidney disease based on his preoperative labs.

## 2013-02-22 ENCOUNTER — Other Ambulatory Visit: Payer: Self-pay | Admitting: *Deleted

## 2013-02-22 ENCOUNTER — Encounter: Payer: Self-pay | Admitting: Vascular Surgery

## 2013-02-22 DIAGNOSIS — N184 Chronic kidney disease, stage 4 (severe): Secondary | ICD-10-CM

## 2013-02-22 DIAGNOSIS — Z01810 Encounter for preprocedural cardiovascular examination: Secondary | ICD-10-CM

## 2013-03-10 DIAGNOSIS — I639 Cerebral infarction, unspecified: Secondary | ICD-10-CM

## 2013-03-10 HISTORY — DX: Cerebral infarction, unspecified: I63.9

## 2013-03-15 ENCOUNTER — Inpatient Hospital Stay (HOSPITAL_COMMUNITY)
Admission: AD | Admit: 2013-03-15 | Discharge: 2013-03-19 | DRG: 041 | Disposition: A | Discharge status: Home / Self Care | Payer: Medicare Other | Source: Other Acute Inpatient Hospital | Attending: Neurology | Admitting: Neurology

## 2013-03-15 ENCOUNTER — Encounter (HOSPITAL_COMMUNITY): Payer: Self-pay | Admitting: *Deleted

## 2013-03-15 DIAGNOSIS — I129 Hypertensive chronic kidney disease with stage 1 through stage 4 chronic kidney disease, or unspecified chronic kidney disease: Secondary | ICD-10-CM | POA: Diagnosis present

## 2013-03-15 DIAGNOSIS — M549 Dorsalgia, unspecified: Secondary | ICD-10-CM | POA: Diagnosis present

## 2013-03-15 DIAGNOSIS — Z8673 Personal history of transient ischemic attack (TIA), and cerebral infarction without residual deficits: Secondary | ICD-10-CM | POA: Diagnosis present

## 2013-03-15 DIAGNOSIS — E119 Type 2 diabetes mellitus without complications: Secondary | ICD-10-CM | POA: Diagnosis present

## 2013-03-15 DIAGNOSIS — Z9282 Status post administration of tPA (rtPA) in a different facility within the last 24 hours prior to admission to current facility: Secondary | ICD-10-CM

## 2013-03-15 DIAGNOSIS — Z87891 Personal history of nicotine dependence: Secondary | ICD-10-CM

## 2013-03-15 DIAGNOSIS — I639 Cerebral infarction, unspecified: Secondary | ICD-10-CM

## 2013-03-15 DIAGNOSIS — N184 Chronic kidney disease, stage 4 (severe): Secondary | ICD-10-CM | POA: Diagnosis present

## 2013-03-15 DIAGNOSIS — R259 Unspecified abnormal involuntary movements: Secondary | ICD-10-CM | POA: Diagnosis present

## 2013-03-15 DIAGNOSIS — E785 Hyperlipidemia, unspecified: Secondary | ICD-10-CM | POA: Diagnosis present

## 2013-03-15 DIAGNOSIS — I10 Essential (primary) hypertension: Secondary | ICD-10-CM

## 2013-03-15 DIAGNOSIS — G4733 Obstructive sleep apnea (adult) (pediatric): Secondary | ICD-10-CM | POA: Diagnosis present

## 2013-03-15 DIAGNOSIS — R609 Edema, unspecified: Secondary | ICD-10-CM | POA: Diagnosis present

## 2013-03-15 DIAGNOSIS — G8190 Hemiplegia, unspecified affecting unspecified side: Secondary | ICD-10-CM | POA: Diagnosis present

## 2013-03-15 DIAGNOSIS — G259 Extrapyramidal and movement disorder, unspecified: Secondary | ICD-10-CM | POA: Diagnosis present

## 2013-03-15 DIAGNOSIS — G8929 Other chronic pain: Secondary | ICD-10-CM | POA: Diagnosis present

## 2013-03-15 DIAGNOSIS — I6350 Cerebral infarction due to unspecified occlusion or stenosis of unspecified cerebral artery: Principal | ICD-10-CM | POA: Diagnosis present

## 2013-03-15 DIAGNOSIS — R4701 Aphasia: Secondary | ICD-10-CM | POA: Diagnosis present

## 2013-03-15 HISTORY — DX: Cerebral infarction, unspecified: I63.9

## 2013-03-15 NOTE — Progress Notes (Signed)
Patient arrived via [facility] at 2310. Oriented to unit and equipment. Neurology paged of arrival.

## 2013-03-16 ENCOUNTER — Inpatient Hospital Stay (HOSPITAL_COMMUNITY): Payer: Medicare Other

## 2013-03-16 DIAGNOSIS — Z8673 Personal history of transient ischemic attack (TIA), and cerebral infarction without residual deficits: Secondary | ICD-10-CM

## 2013-03-16 DIAGNOSIS — G259 Extrapyramidal and movement disorder, unspecified: Secondary | ICD-10-CM

## 2013-03-16 HISTORY — DX: Extrapyramidal and movement disorder, unspecified: G25.9

## 2013-03-16 HISTORY — DX: Personal history of transient ischemic attack (TIA), and cerebral infarction without residual deficits: Z86.73

## 2013-03-16 LAB — GLUCOSE, CAPILLARY
Glucose-Capillary: 102 mg/dL — ABNORMAL HIGH (ref 70–99)
Glucose-Capillary: 104 mg/dL — ABNORMAL HIGH (ref 70–99)
Glucose-Capillary: 106 mg/dL — ABNORMAL HIGH (ref 70–99)
Glucose-Capillary: 123 mg/dL — ABNORMAL HIGH (ref 70–99)
Glucose-Capillary: 146 mg/dL — ABNORMAL HIGH (ref 70–99)
Glucose-Capillary: 152 mg/dL — ABNORMAL HIGH (ref 70–99)

## 2013-03-16 LAB — CBC WITH DIFFERENTIAL/PLATELET
BASOS ABS: 0 10*3/uL (ref 0.0–0.1)
Basophils Relative: 0 % (ref 0–1)
Eosinophils Absolute: 0.5 10*3/uL (ref 0.0–0.7)
Eosinophils Relative: 6 % — ABNORMAL HIGH (ref 0–5)
HCT: 26.8 % — ABNORMAL LOW (ref 39.0–52.0)
Hemoglobin: 9 g/dL — ABNORMAL LOW (ref 13.0–17.0)
Lymphocytes Relative: 22 % (ref 12–46)
Lymphs Abs: 1.7 10*3/uL (ref 0.7–4.0)
MCH: 29.4 pg (ref 26.0–34.0)
MCHC: 33.6 g/dL (ref 30.0–36.0)
MCV: 87.6 fL (ref 78.0–100.0)
Monocytes Absolute: 0.5 10*3/uL (ref 0.1–1.0)
Monocytes Relative: 6 % (ref 3–12)
NEUTROS ABS: 4.9 10*3/uL (ref 1.7–7.7)
NEUTROS PCT: 65 % (ref 43–77)
Platelets: 115 10*3/uL — ABNORMAL LOW (ref 150–400)
RBC: 3.06 MIL/uL — ABNORMAL LOW (ref 4.22–5.81)
RDW: 14 % (ref 11.5–15.5)
WBC: 7.5 10*3/uL (ref 4.0–10.5)

## 2013-03-16 LAB — COMPREHENSIVE METABOLIC PANEL
ALK PHOS: 62 U/L (ref 39–117)
ALT: 11 U/L (ref 0–53)
AST: 9 U/L (ref 0–37)
Albumin: 3.3 g/dL — ABNORMAL LOW (ref 3.5–5.2)
BUN: 49 mg/dL — AB (ref 6–23)
CO2: 20 meq/L (ref 19–32)
Calcium: 8.4 mg/dL (ref 8.4–10.5)
Chloride: 110 mEq/L (ref 96–112)
Creatinine, Ser: 4.71 mg/dL — ABNORMAL HIGH (ref 0.50–1.35)
GFR calc non Af Amer: 12 mL/min — ABNORMAL LOW (ref >90)
GFR, EST AFRICAN AMERICAN: 14 mL/min — AB (ref >90)
Glucose, Bld: 124 mg/dL — ABNORMAL HIGH (ref 70–99)
Potassium: 4.7 mEq/L (ref 3.7–5.3)
SODIUM: 145 meq/L (ref 137–147)
Total Bilirubin: 0.2 mg/dL — ABNORMAL LOW (ref 0.3–1.2)
Total Protein: 6.7 g/dL (ref 6.0–8.3)

## 2013-03-16 LAB — LIPID PANEL
CHOLESTEROL: 116 mg/dL (ref 0–200)
HDL: 34 mg/dL — AB (ref >39)
LDL Cholesterol: 62 mg/dL (ref 0–99)
Total CHOL/HDL Ratio: 3.4 RATIO
Triglycerides: 98 mg/dL (ref <150)
VLDL: 20 mg/dL (ref 0–40)

## 2013-03-16 LAB — HEMOGLOBIN A1C
HEMOGLOBIN A1C: 7 % — AB (ref <5.7)
MEAN PLASMA GLUCOSE: 154 mg/dL — AB (ref <117)

## 2013-03-16 LAB — MRSA PCR SCREENING: MRSA BY PCR: NEGATIVE

## 2013-03-16 MED ORDER — ASPIRIN EC 325 MG PO TBEC
325.0000 mg | DELAYED_RELEASE_TABLET | Freq: Every day | ORAL | Status: DC
  Administered 2013-03-16 – 2013-03-19 (×4): 325 mg via ORAL
  Filled 2013-03-16 (×5): qty 1

## 2013-03-16 MED ORDER — ENOXAPARIN SODIUM 40 MG/0.4ML ~~LOC~~ SOLN: 40.0000 mg | SUBCUTANEOUS | Status: DC

## 2013-03-16 MED ORDER — HYDROCODONE-ACETAMINOPHEN 10-325 MG PO TABS
1.0000 | ORAL_TABLET | Freq: Four times a day (QID) | ORAL | Status: DC | PRN
  Administered 2013-03-16 – 2013-03-19 (×10): 1 via ORAL
  Filled 2013-03-16 (×9): qty 1

## 2013-03-16 MED ORDER — ENOXAPARIN SODIUM 30 MG/0.3ML ~~LOC~~ SOLN
30.0000 mg | SUBCUTANEOUS | Status: DC
  Administered 2013-03-16 – 2013-03-19 (×4): 30 mg via SUBCUTANEOUS
  Filled 2013-03-16 (×4): qty 0.3

## 2013-03-16 MED ORDER — SODIUM CHLORIDE 0.9 % IV SOLN
1000.0000 mg | Freq: Once | INTRAVENOUS | Status: AC
  Administered 2013-03-16: 1000 mg via INTRAVENOUS
  Filled 2013-03-16: qty 10

## 2013-03-16 MED ORDER — INSULIN ASPART 100 UNIT/ML ~~LOC~~ SOLN
2.0000 [IU] | SUBCUTANEOUS | Status: DC
  Administered 2013-03-16: 2 [IU] via SUBCUTANEOUS
  Administered 2013-03-16: 4 [IU] via SUBCUTANEOUS
  Administered 2013-03-16: 2 [IU] via SUBCUTANEOUS

## 2013-03-16 MED ORDER — PANTOPRAZOLE SODIUM 40 MG PO TBEC
40.0000 mg | DELAYED_RELEASE_TABLET | Freq: Every day | ORAL | Status: DC
  Administered 2013-03-16 – 2013-03-17 (×2): 40 mg via ORAL
  Filled 2013-03-16 (×2): qty 1

## 2013-03-16 MED ORDER — PANTOPRAZOLE SODIUM 40 MG IV SOLR
40.0000 mg | Freq: Every day | INTRAVENOUS | Status: DC
  Administered 2013-03-16: 40 mg via INTRAVENOUS
  Filled 2013-03-16 (×2): qty 40

## 2013-03-16 MED ORDER — LABETALOL HCL 5 MG/ML IV SOLN: 10.0000 mg | INTRAVENOUS | Status: DC | PRN

## 2013-03-16 MED ORDER — ACETAMINOPHEN 650 MG RE SUPP: 650.0000 mg | RECTAL | Status: DC | PRN

## 2013-03-16 MED ORDER — SODIUM CHLORIDE 0.9 % IV SOLN
INTRAVENOUS | Status: DC
  Administered 2013-03-16: 01:00:00 via INTRAVENOUS
  Administered 2013-03-17: 75 mL/h via INTRAVENOUS

## 2013-03-16 MED ORDER — ACETAMINOPHEN 325 MG PO TABS: 650.0000 mg | ORAL_TABLET | ORAL | Status: DC | PRN

## 2013-03-16 NOTE — Procedures (Signed)
ELECTROENCEPHALOGRAM REPORT  Patient: Jon [name]       Room #: [id number] EEG No. ID: [id number] Age: 63 y.o.        Sex: male Referring Physician:  Leonie Man Report Date:  03/16/2013        Interpreting Physician: Anthony Sar  History: Jon [name] is an 63 y.o. male admitted following acute stroke and TPA administration. Patient had persistent tremors right hand.  Indications for study:  Rule out focal seizure disorder.  Technique: This is an 18 channel routine scalp EEG performed at the bedside with bipolar and monopolar montages arranged in accordance to the international 10/20 system of electrode placement.   Description: This EEG recording was performed during wakefulness and during sleep. Predominant background activity during wakefulness consisted of 10 Hz symmetrical palatal rhythm which attenuated well with eye opening. Photic stimulation wasn't performed. Hyperventilation was not performed. During sleep there was symmetrical slowing of cerebral activity with mixed irregular delta and theta activity. Symmetrical vertex waves, sleep spindles and arousal responses were recorded during stage II sleep. No epileptiform discharges were recorded during wakefulness and return sleep. There was no abnormal slowing of cerebral activity.  Interpretation: This is a normal EEG recording during wakefulness and during sleep. No evidence of an epileptic disorder was demonstrated.   Rush Farmer M.D. [facility] [phone]

## 2013-03-16 NOTE — H&P (Addendum)
Neurology H&P  CC: Right-sided numbness  History is obtained from: Patient  HPI: Jon [name] is a 63 y.o. male with a history of hypertension, hyperlipidemia who presents with sudden onset right-sided numbness and difficulty with word finding that started abruptly while eating dinner. He was evaluated at [facility] and the decision was made to give t-PA there. He was then transferred to [facility].  He states that he has never had anything like this before. Since arrival, he has had several episodes of right arm shaking. He denies being able to suppress these.   He also has chronic kidney disease and creatine was 5 at OSH, was 4.2 in 12/2012  LKW: 3 pm tpa given?: yes    ROS: A 14 point ROS was performed and is negative except as noted in the HPI.  Past Medical History  Diagnosis Date  . Hypertension     takes Amlodipine and Catapres daily  . Hyperlipidemia     takes Lovastatin daily  . Sleep apnea   . History of bronchitis     last time at least 23yrs ago  . Pneumonia     last time about 40yrs ago  . Arthritis   . Chronic back pain     radiculopathy and stenosis  . History of colon polyps   . H/O hiatal hernia   . Urinary frequency   . Peripheral edema     takes Lasix daily  . Nocturia   . Diabetes mellitus     Levemir nightly ;type 2    Family History: No history of stroke  Social History: Tob: Former smoker  Exam: Current vital signs: There were no vitals taken for this visit. Vital signs in last 24 hours:    General: In bed, NAD  CV: Regular rate and rhythm Mental Status: Patient is awake, alert, oriented to person, place, month, year, and situation. Immediate and remote memory are intact. Patient is able to give a clear and coherent history. No signs of neglect. He has a mild expressive aphasia. Cranial Nerves: II: Visual Fields are full. Pupils are equal, round, and reactive to light.  Discs are difficult to visualize. III,IV, VI: EOMI without  ptosis or diploplia.  V: Facial sensation is diminished on right to temperature VII: Facial movement is symmetric.  VIII: hearing is intact to voice X: Uvula elevates symmetrically XI: Shoulder shrug is symmetric. XII: tongue is midline without atrophy or fasciculations.  Motor: Tone is normal. Bulk is normal. 5/5 strength was present in bilateral arms, he has very mild 4+/5 weakness his right leg with mild drift. He appears to have some trouble with fine motor movements of his right hand Sensory: Sensation is diminished throughout the right side Deep Tendon Reflexes: 2+ and symmetric in the biceps and patellae.  Cerebellar: FNF intact bilaterally, though slower on the right than left Gait: Not tested due to multiple medical monitors in the ICU setting   Impression: 63 year old male with a history of hypertension, hyperlipidemia who presents with right-sided weakness and expressive aphasia of sudden onset. This is most consistent with ischemic stroke. It is unclear to me what is causing the right hand tremors, but given her lack of being able to suppress them I did give a single dose of Keppra. It is not clear to me, however that these represent seizure.  Recommendations: 1. HgbA1c, fasting lipid panel 2. MRI, MRA  of the brain without contrast 3. Frequent neuro checks 4. Echocardiogram 5. Carotid dopplers 6. Prophylactic therapy-None  7. Risk factor modification 8. Telemetry monitoring 9. PT consult, OT consult, Speech consult 10 EEG  This patient is critically ill and at significant risk of neurological worsening, death and care requires constant monitoring of vital signs, hemodynamics,respiratory and cardiac monitoring, neurological assessment, discussion with family, other specialists and medical decision making of high complexity. I spent 45 minutes of neurocritical care time  in the care of  this patient.  Roland Rack, MD [facility] [phone]  If  7pm- 7am, please page neurology on call at [phone].  03/16/2013  12:31 AM

## 2013-03-16 NOTE — Progress Notes (Signed)
EEG completed; results pending.    

## 2013-03-16 NOTE — Progress Notes (Signed)
Dr. Leonel Ramsay notified of new onset of right arm shaking. MD came to assess patient. Orders received. Will continue to monitor.

## 2013-03-16 NOTE — Progress Notes (Addendum)
Stroke Team Progress Note  HISTORY  63 y.o. male with a history of hypertension, hyperlipidemia who presents with sudden onset right-sided numbness and difficulty with word finding that started abruptly while eating dinner. He was evaluated at [facility] and the decision was made to give t-PA there. He was then transferred to [facility].  He states that he has never had anything like this before. Since arrival, he has had several episodes of right arm shaking. He denies being able to suppress these. S/p Keppra 1gm. TPA at 16:44.    SUBJECTIVE Pt is almost back to baseline able to follow commands. Slight numbness in RUE and aphasia.   OBJECTIVE Most recent Vital Signs: Filed Vitals:   03/16/13 0500 03/16/13 0600 03/16/13 0700 03/16/13 0800  BP: 141/71 135/71 152/76 153/80  Pulse: 51 53 59 53  Temp:      TempSrc:      Resp: 15 13 17 13   Height:      Weight:      SpO2: 99% 98% 98% 98%   CBG (last 3)   Recent Labs  03/16/13 0054 03/16/13 0350  GLUCAP 123* 104*    IV Fluid Intake:   . sodium chloride 75 mL/hr at 03/16/13 0100    MEDICATIONS  . insulin aspart  2-6 Units Subcutaneous 6 times per day  . pantoprazole (PROTONIX) IV  40 mg Intravenous QHS   PRN:  acetaminophen, acetaminophen, labetalol  Diet:  Carb Control  Activity:  Bedrest DVT Prophylaxis:  SCDs  CLINICALLY SIGNIFICANT STUDIES Basic Metabolic Panel:  Recent Labs Lab 03/16/13 0302  NA 145  K 4.7  CL 110  CO2 20  GLUCOSE 124*  BUN 49*  CREATININE 4.71*  CALCIUM 8.4   Liver Function Tests:  Recent Labs Lab 03/16/13 0302  AST 9  ALT 11  ALKPHOS 62  BILITOT 0.2*  PROT 6.7  ALBUMIN 3.3*   CBC:  Recent Labs Lab 03/16/13 0302  WBC 7.5  NEUTROABS 4.9  HGB 9.0*  HCT 26.8*  MCV 87.6  PLT 115*   Coagulation: No results found for this basename: LABPROT, INR,  in the last 168 hours Cardiac Enzymes: No results found for this basename: CKTOTAL, CKMB, CKMBINDEX, TROPONINI,  in the last 168  hours Urinalysis: No results found for this basename: COLORURINE, APPERANCEUR, LABSPEC, PHURINE, GLUCOSEU, HGBUR, BILIRUBINUR, KETONESUR, PROTEINUR, UROBILINOGEN, NITRITE, LEUKOCYTESUR,  in the last 168 hours Lipid Panel    Component Value Date/Time   CHOL 116 03/16/2013 0302   TRIG 98 03/16/2013 0302   HDL 34* 03/16/2013 0302   CHOLHDL 3.4 03/16/2013 0302   VLDL 20 03/16/2013 0302   LDLCALC 62 03/16/2013 0302   HgbA1C  Lab Results  Component Value Date   HGBA1C 5.5 01/23/2011    Urine Drug Screen:   No results found for this basename: labopia, cocainscrnur, labbenz, amphetmu, thcu, labbarb    Alcohol Level: No results found for this basename: ETH,  in the last 168 hours  No results found.  CT of the brain    MRI of the brain    MRA of the brain    2D Echocardiogram    Carotid Doppler    CXR    EKG sinus  Therapy Recommendations pending  Physical Exam   General: In bed, NAD  CV: Regular rate and rhythm  Mental Status:  Patient is awake, alert, oriented to person, place, month, year, and situation.  Immediate and remote memory are intact.  Patient is able to give a  clear and coherent history.  No signs of neglect. He has a mild expressive aphasia.  Cranial Nerves:  II: Visual Fields are full. Pupils are equal, round, and reactive to light. Discs are difficult to visualize.  III,IV, VI: EOMI without ptosis or diploplia.  V: Facial sensation is diminished on right to temperature  VII: Facial movement is symmetric.  VIII: hearing is intact to voice  X: Uvula elevates symmetrically  XI: Shoulder shrug is symmetric.  XII: tongue is midline without atrophy or fasciculations.  Motor:  Tone is normal. Bulk is normal. 5/5 b/l decreased grip RUE Sensory:  Sensation is diminished throughout the right side  Deep Tendon Reflexes:  2+ and symmetric in the biceps and patellae.  Cerebellar:  FNF intact bilaterally, though slower on the right than left  Gait:  Not tested due to  multiple medical monitors in the ICU setting  ASSESSMENT Jon [name] is a 63 y.o. male with a history of hypertension, hyperlipidemia who presents with right-sided weakness and expressive aphasia of sudden onset. This is most consistent with ischemic stroke. It is unclear to me what is causing the right hand tremors, but given her lack of being able to suppress them. S/p 1gm seizures. Received TPA at OSH and transferred to [facility].       Hospital day # 1  TREATMENT/PLAN  ASA 325 24 hrs post TPA as well as lovenox at 17:00 today.   MRI, MRA  EEG today  Echo  Carotid doppler  Tele  Pt/ot speech  Transfer out of unit to regular floor tonight.   Hydrocodone for chronic back pain  03/16/2013 8:39 AM  I have personally obtained a history, examined the patient, evaluated imaging results, and formulated the assessment and plan of care. I agree with the above.

## 2013-03-16 NOTE — Progress Notes (Signed)
PT Cancellation Note  Patient Details Name: Jon [name] MRN: [id number] DOB: [birth year]   Cancelled Treatment:    Reason Eval/Treat Not Completed: Medical issues which prohibited therapy;Patient not medically ready. Pt given TPA on bedrest 24hours; will re-attempt to evaluate pt when activity orders are updated and pt is medically ready.    Elie Confer [place], [place] 03/16/2013, 8:03 AM

## 2013-03-17 DIAGNOSIS — I6350 Cerebral infarction due to unspecified occlusion or stenosis of unspecified cerebral artery: Secondary | ICD-10-CM

## 2013-03-17 DIAGNOSIS — I517 Cardiomegaly: Secondary | ICD-10-CM

## 2013-03-17 DIAGNOSIS — G259 Extrapyramidal and movement disorder, unspecified: Secondary | ICD-10-CM

## 2013-03-17 LAB — GLUCOSE, CAPILLARY
GLUCOSE-CAPILLARY: 105 mg/dL — AB (ref 70–99)
GLUCOSE-CAPILLARY: 174 mg/dL — AB (ref 70–99)
GLUCOSE-CAPILLARY: 89 mg/dL (ref 70–99)
GLUCOSE-CAPILLARY: 96 mg/dL (ref 70–99)
Glucose-Capillary: 120 mg/dL — ABNORMAL HIGH (ref 70–99)
Glucose-Capillary: 164 mg/dL — ABNORMAL HIGH (ref 70–99)

## 2013-03-17 MED ORDER — CALCITRIOL 0.25 MCG PO CAPS
0.2500 ug | ORAL_CAPSULE | Freq: Every day | ORAL | Status: DC
  Administered 2013-03-17 – 2013-03-19 (×3): 0.25 ug via ORAL
  Filled 2013-03-17 (×3): qty 1

## 2013-03-17 MED ORDER — SIMVASTATIN 20 MG PO TABS
20.0000 mg | ORAL_TABLET | Freq: Every day | ORAL | Status: DC
  Administered 2013-03-18 – 2013-03-19 (×2): 20 mg via ORAL
  Filled 2013-03-17 (×2): qty 1

## 2013-03-17 MED ORDER — CLONIDINE HCL 0.1 MG PO TABS
0.1000 mg | ORAL_TABLET | Freq: Three times a day (TID) | ORAL | Status: DC
  Administered 2013-03-17 – 2013-03-18 (×2): 0.1 mg via ORAL
  Filled 2013-03-17 (×4): qty 1

## 2013-03-17 MED ORDER — TAMSULOSIN HCL 0.4 MG PO CAPS
0.4000 mg | ORAL_CAPSULE | Freq: Every day | ORAL | Status: DC
Start: 2013-03-18 — End: 2013-03-19
  Administered 2013-03-18 – 2013-03-19 (×2): 0.4 mg via ORAL
  Filled 2013-03-17 (×4): qty 1

## 2013-03-17 MED ORDER — STROKE: EARLY STAGES OF RECOVERY BOOK
Freq: Once | Status: AC
  Administered 2013-03-17: 18:00:00
  Filled 2013-03-17 (×2): qty 1

## 2013-03-17 MED ORDER — FUROSEMIDE 80 MG PO TABS
80.0000 mg | ORAL_TABLET | Freq: Two times a day (BID) | ORAL | Status: DC
  Administered 2013-03-17 – 2013-03-19 (×5): 80 mg via ORAL
  Filled 2013-03-17 (×7): qty 1

## 2013-03-17 MED ORDER — INSULIN GLARGINE 100 UNIT/ML ~~LOC~~ SOLN
30.0000 [IU] | Freq: Every day | SUBCUTANEOUS | Status: DC
  Administered 2013-03-17 – 2013-03-18 (×2): 30 [IU] via SUBCUTANEOUS
  Filled 2013-03-17 (×3): qty 0.3

## 2013-03-17 MED ORDER — INSULIN ASPART 100 UNIT/ML ~~LOC~~ SOLN
2.0000 [IU] | Freq: Three times a day (TID) | SUBCUTANEOUS | Status: DC
  Administered 2013-03-17 (×2): 4 [IU] via SUBCUTANEOUS
  Administered 2013-03-18 – 2013-03-19 (×3): 2 [IU] via SUBCUTANEOUS

## 2013-03-17 NOTE — Progress Notes (Signed)
VASCULAR LAB PRELIMINARY  PRELIMINARY  PRELIMINARY  PRELIMINARY  Carotid Dopplers completed.    Preliminary report:  1-39% ICA stenosis. Vertebral artery flow is antegrade.  Toddrick Sanna, RVT 03/17/2013, 12:04 PM

## 2013-03-17 NOTE — Evaluation (Addendum)
Physical Therapy Evaluation Patient Details Name: Jon [name] MRN: [id number] DOB: 24-Jan-1950 Today's Date: 03/17/2013 Time: [id number] PT Time Calculation (min): 16 min  PT Assessment / Plan / Recommendation History of Present Illness  63 y.o. male with a history of hypertension, hyperlipidemia who presents with sudden onset right-sided numbness and difficulty with word finding that started abruptly while eating dinner. He was evaluated at [facility] and the decision was made to give t-PA there. Pt with acute Lt MCA infarct.   Clinical Impression  Pt adm due to the above. Presents with limitations in functional mobility secondary to deficits indicated below. Pt to benefit from skilled acute PT to address deficits and maximize mobility prior to D/C home with wife. Pt ambulating with cane prior to admission; may benefit from cane vs RW pending progress. Limited mobility during evaluation secondary to incr BP. Pt was asymptomatic with BP 193/70. Pt and wife educated on stroke signs and symptoms with "BE FAST"; pt and wife verbalized understanding of signs and symptoms and early detection.     PT Assessment  Patient needs continued PT services    Follow Up Recommendations  Outpatient PT;Supervision for mobility/OOB (may need OPPT-- pending rehab progress)    Does the patient have the potential to tolerate intense rehabilitation      Barriers to Discharge        Equipment Recommendations  None recommended by PT    Recommendations for Other Services OT consult   Frequency Min 4X/week    Precautions / Restrictions Precautions Precautions: Fall Precaution Comments: pt with PLIF in December  Restrictions Weight Bearing Restrictions: No   Pertinent Vitals/Pain 193/70 with activity. RN notified.      Mobility  Bed Mobility Overal bed mobility: Needs Assistance Bed Mobility: Supine to Sit Supine to sit: Min assist;HOB elevated General bed mobility comments: effortful for pt;  requires min (A) to bring shoulders to upright position; cues for sequencing and safety  Transfers Overall transfer level: Needs assistance Equipment used: None Transfers: Sit to/from Stand Sit to Stand: Min guard General transfer comment: slightly unsteady with intial stand; bracing LEs against bed; BP 193/70 after standing; transferred to chair; pt asymptomatic; min guard to steady and for safety   Ambulation/Gait Ambulation/Gait assistance: Min guard Ambulation Distance (Feet): 8 Feet Assistive device:  (IV pole ) Gait Pattern/deviations: Step-through pattern;Decreased stride length;Narrow base of support Gait velocity: decreased; cautious Gait velocity interpretation: Below normal speed for age/gender General Gait Details: pt ambulated around bed to chair; did not increase ambulation to hallway secondary to incr BP with activity; slightly unsteady and reaching for UE support at times; may beenfit from ambulating with cane; cues for sequencing and safety  Modified Rankin (Stroke Patients Only) Pre-Morbid Rankin Score: No symptoms Modified Rankin: Moderately severe disability         PT Diagnosis: Difficulty walking  PT Problem List: Impaired sensation;Decreased knowledge of use of DME;Decreased mobility;Decreased balance;Decreased activity tolerance PT Treatment Interventions: DME instruction;Gait training;Functional mobility training;Therapeutic activities;Therapeutic exercise;Balance training;Neuromuscular re-education;Patient/family education     PT Goals(Current goals can be found in the care plan section) Acute Rehab PT Goals Patient Stated Goal: to get out of here PT Goal Formulation: With patient Time For Goal Achievement: 03/31/13 Potential to Achieve Goals: Good  Visit Information  Last PT Received On: 03/17/13 Assistance Needed: +1 History of Present Illness: 63 y.o. male with a history of hypertension, hyperlipidemia who presents with sudden onset right-sided  numbness and difficulty with word finding that started abruptly  while eating dinner. He was evaluated at [facility] and the decision was made to give t-PA there. Pt with acute Lt MCA infarct.        Prior [place] expects to be discharged to:: Private residence Living Arrangements: Spouse/significant other Available Help at Discharge: Family;Available 24 hours/day Type of Home: House Home Access: Ramped entrance Home Layout: One level Home Equipment: Bedside commode;Walker - 2 wheels;Cane - single point;Crutches Prior Function Level of Independence: Independent Comments: pt drives and is independent with ADLs; ambulates with SPC  Communication Communication: No difficulties Dominant Hand: Right    Cognition  Cognition Arousal/Alertness: Awake/alert Behavior During Therapy: WFL for tasks assessed/performed Overall Cognitive Status: Within Functional Limits for tasks assessed    Extremity/Trunk Assessment Upper Extremity Assessment Upper Extremity Assessment: Overall WFL for tasks assessed (Rt UE 'numbness" ) Lower Extremity Assessment Lower Extremity Assessment: RLE deficits/detail RLE Deficits / Details: strength WFL; decreased sensation on Rt LE; c/o numbness RLE Sensation: decreased light touch Cervical / Trunk Assessment Cervical / Trunk Assessment: Normal   Balance Balance Overall balance assessment: Needs assistance Sitting-balance support: Feet supported;No upper extremity supported Sitting balance-Leahy Scale: Good Sitting balance - Comments: tolerated sitting EOB ~5 min Standing balance support: During functional activity;Single extremity supported Standing balance-Leahy Scale: Poor Standing balance comment: slight posterior sway; UE supported General Comments General comments (skin integrity, edema, etc.): no changes in vision noted at this time; pt wears glasses all the time   End of Session PT - End of Session Equipment  Utilized During Treatment: Gait belt Activity Tolerance: Patient tolerated treatment well Patient left: in chair;with call bell/phone within reach;with family/visitor present Nurse Communication: Mobility status;Precautions (BP)  GP     Melina Modena [place], [place] [phone] 03/17/2013, 3:12 PM

## 2013-03-17 NOTE — Progress Notes (Signed)
Stroke Team Progress Note  HISTORY  63 y.o. male with a history of hypertension, hyperlipidemia who presents with sudden onset right-sided numbness and difficulty with word finding that started abruptly while eating dinner. He was evaluated at [facility] and the decision was made to give t-PA there. He was then transferred to [facility].  He states that he has never had anything like this before. Since arrival, he has had several episodes of right arm shaking. He denies being able to suppress these. S/p Keppra 1gm. TPA at 16:44.    SUBJECTIVE Pt is almost back to baseline able to follow commands. Slight numbness in RUE and aphasia. S/p EEG with normal awake/sleep cycle.   OBJECTIVE Most recent Vital Signs: Filed Vitals:   03/17/13 0500 03/17/13 0600 03/17/13 0700 03/17/13 0800  BP: 156/68 147/66 145/55 180/79  Pulse: 56 57 57 63  Temp:    98.1 F (36.7 C)  TempSrc:    Oral  Resp: 14 15 16 20   Height:      Weight:      SpO2: 95% 95% 93% 99%   CBG (last 3)   Recent Labs  03/17/13 0037 03/17/13 0418 03/17/13 0843  GLUCAP 89 96 105*    IV Fluid Intake:   . sodium chloride 75 mL/hr (03/17/13 0556)    MEDICATIONS  . aspirin EC  325 mg Oral Daily  . enoxaparin (LOVENOX) injection  30 mg Subcutaneous Q24H  . insulin aspart  2-6 Units Subcutaneous TID WC & HS  . pantoprazole  40 mg Oral Q1200   PRN:  acetaminophen, acetaminophen, HYDROcodone-acetaminophen, labetalol  Diet:  Carb Control  Activity:  Bedrest DVT Prophylaxis:  SCDs  CLINICALLY SIGNIFICANT STUDIES Basic Metabolic Panel:   Recent Labs Lab 03/16/13 0302  NA 145  K 4.7  CL 110  CO2 20  GLUCOSE 124*  BUN 49*  CREATININE 4.71*  CALCIUM 8.4   Liver Function Tests:   Recent Labs Lab 03/16/13 0302  AST 9  ALT 11  ALKPHOS 62  BILITOT 0.2*  PROT 6.7  ALBUMIN 3.3*   CBC:   Recent Labs Lab 03/16/13 0302  WBC 7.5  NEUTROABS 4.9  HGB 9.0*  HCT 26.8*  MCV 87.6  PLT 115*   Coagulation: No  results found for this basename: LABPROT, INR,  in the last 168 hours Cardiac Enzymes: No results found for this basename: CKTOTAL, CKMB, CKMBINDEX, TROPONINI,  in the last 168 hours Urinalysis: No results found for this basename: COLORURINE, APPERANCEUR, LABSPEC, PHURINE, GLUCOSEU, HGBUR, BILIRUBINUR, KETONESUR, PROTEINUR, UROBILINOGEN, NITRITE, LEUKOCYTESUR,  in the last 168 hours Lipid Panel    Component Value Date/Time   CHOL 116 03/16/2013 0302   TRIG 98 03/16/2013 0302   HDL 34* 03/16/2013 0302   CHOLHDL 3.4 03/16/2013 0302   VLDL 20 03/16/2013 0302   LDLCALC 62 03/16/2013 0302   HgbA1C  Lab Results  Component Value Date   HGBA1C 7.0* 03/16/2013    Urine Drug Screen:   No results found for this basename: labopia,  cocainscrnur,  labbenz,  amphetmu,  thcu,  labbarb    Alcohol Level: No results found for this basename: ETH,  in the last 168 hours  Dg Chest 2 View  03/16/2013   CLINICAL DATA:  Stroke.  EXAM: CHEST  2 VIEW  COMPARISON:  03/15/2013  FINDINGS: Heart size is slightly enlarged but unchanged. Negative for airspace disease or edema. Bony thorax is intact. No definite pleural effusions.  IMPRESSION: Stable mild cardiomegaly.  No acute  chest findings.   Electronically Signed   By: Jon [name] M.D.   On: 03/16/2013 16:11   Mr Brain Wo Contrast  03/16/2013   CLINICAL DATA:  Aphasia.  24 hr status post t-PA.  EXAM: MRI HEAD WITHOUT CONTRAST  MRA HEAD WITHOUT CONTRAST  TECHNIQUE: Multiplanar, multiecho pulse sequences of the brain and surrounding structures were obtained without intravenous contrast. Angiographic images of the head were obtained using MRA technique without contrast.  COMPARISON:  Head CT 03/15/2013  FINDINGS: MRI HEAD FINDINGS  There is confluent restricted diffusion consistent with acute infarct involving the anterior, lateral left parietal lobe and parietal operculum. A few separate, punctate foci of acute infarct are also present in both the postcentral and precentral gyri on  the left. A single, small focus of susceptibility artifact in the region of infarct may represent focal petechial hemorrhage. There is mild edema without mass effect. There is mild generalized cerebral atrophy. There is no midline shift or extra-axial fluid collection.  Orbits are unremarkable. There is mild bilateral maxillary sinus mucosal thickening. A small amount of fluid is present left maxillary sinus. Left sphenoid sinus mucous retention cysts are noted. Mastoid air cells are clear. Vertebrobasilar flow voids are small in caliber. Major intracranial vascular flow voids otherwise appear preserved.  MRA HEAD FINDINGS  The distal right vertebral artery is markedly small in caliber diffusely, likely congenital, and may end in PICA. Distal left vertebral artery is patent and dominant. Left PICA origin is patent. Bilateral AICA origins are patent, with the right being dominant. Basilar artery is diffusely small in caliber without focal stenosis. SCA origins are patent bilaterally. P1 segments are markedly hypoplastic with prominent posterior communicating arteries present bilaterally (fetal type circulation). PCAs are otherwise unremarkable.  Internal carotid arteries are patent from skullbase to carotid termini. ACAs and MCAs are unremarkable. There is a small, patent anterior communicating artery. No intracranial aneurysm is identified.  IMPRESSION: 1. Acute left MCA infarct. 2. Variant intracranial arterial anatomy as above. No evidence of major intracranial arterial occlusion.   Electronically Signed   By: Jon [name]   On: 03/16/2013 16:17   Mr Jon [name] Head/brain Wo Cm  03/16/2013   CLINICAL DATA:  Aphasia.  24 hr status post t-PA.  EXAM: MRI HEAD WITHOUT CONTRAST  MRA HEAD WITHOUT CONTRAST  TECHNIQUE: Multiplanar, multiecho pulse sequences of the brain and surrounding structures were obtained without intravenous contrast. Angiographic images of the head were obtained using MRA technique without contrast.   COMPARISON:  Head CT 03/15/2013  FINDINGS: MRI HEAD FINDINGS  There is confluent restricted diffusion consistent with acute infarct involving the anterior, lateral left parietal lobe and parietal operculum. A few separate, punctate foci of acute infarct are also present in both the postcentral and precentral gyri on the left. A single, small focus of susceptibility artifact in the region of infarct may represent focal petechial hemorrhage. There is mild edema without mass effect. There is mild generalized cerebral atrophy. There is no midline shift or extra-axial fluid collection.  Orbits are unremarkable. There is mild bilateral maxillary sinus mucosal thickening. A small amount of fluid is present left maxillary sinus. Left sphenoid sinus mucous retention cysts are noted. Mastoid air cells are clear. Vertebrobasilar flow voids are small in caliber. Major intracranial vascular flow voids otherwise appear preserved.  MRA HEAD FINDINGS  The distal right vertebral artery is markedly small in caliber diffusely, likely congenital, and may end in PICA. Distal left vertebral artery is patent and dominant. Left  PICA origin is patent. Bilateral AICA origins are patent, with the right being dominant. Basilar artery is diffusely small in caliber without focal stenosis. SCA origins are patent bilaterally. P1 segments are markedly hypoplastic with prominent posterior communicating arteries present bilaterally (fetal type circulation). PCAs are otherwise unremarkable.  Internal carotid arteries are patent from skullbase to carotid termini. ACAs and MCAs are unremarkable. There is a small, patent anterior communicating artery. No intracranial aneurysm is identified.  IMPRESSION: 1. Acute left MCA infarct. 2. Variant intracranial arterial anatomy as above. No evidence of major intracranial arterial occlusion.   Electronically Signed   By: Jon [name]   On: 03/16/2013 16:17    CT of the brain    MRI of the brain  Acute left  MCA infarct that is cortical in nature   MRA of the brain  No evidence of  major intracranial arterial occlusion.   2D Echocardiogram    Carotid Doppler    CXR    EKG sinus  Therapy Recommendations pending  Physical Exam   General: In bed, NAD  CV: Regular rate and rhythm  Mental Status:  Patient is awake, alert, oriented to person, place, month, year, and situation.  Immediate and remote memory are intact.  Patient is able to give a clear and coherent history.  No signs of neglect. He has a mild expressive aphasia.  Cranial Nerves:  II: Visual Fields are full. Pupils are equal, round, and reactive to light. Discs are difficult to visualize.  III,IV, VI: EOMI without ptosis or diploplia.  V: Facial sensation is diminished on right to temperature  VII: Facial movement is symmetric.  VIII: hearing is intact to voice  X: Uvula elevates symmetrically  XI: Shoulder shrug is symmetric.  XII: tongue is midline without atrophy or fasciculations.  Motor:  Tone is normal. Bulk is normal. 5/5 b/l decreased grip RUE Sensory:  Sensation is diminished throughout the right side  Deep Tendon Reflexes:  2+ and symmetric in the biceps and patellae.  Cerebellar:  FNF intact bilaterally, though slower on the right than left  Gait:  Not tested due to multiple medical monitors in the ICU setting  ASSESSMENT [name] is a 63 y.o. male with a history of hypertension, hyperlipidemia who presents with right-sided weakness and expressive aphasia of sudden onset. This is most consistent with ischemic stroke. It is unclear to me what is causing the right hand tremors, but given her lack of being able to suppress them. S/p 1gm seizures. Received TPA at OSH and transferred to [facility].   - HTN - LDL 62 - DM    Hospital day # 2  TREATMENT/PLAN  ASA 325   EEG- no seizure. Pt close to baseline. Will hold off AED  Echo  Carotid doppler  Tele  Pt/ot speech today  Transfer  out of unit today  Hydrocodone for chronic back pain  Accu checks.   03/17/2013 10:00 AM  I have personally obtained a history, examined the patient, evaluated imaging results, and formulated the assessment and plan of care. I agree with the above.

## 2013-03-17 NOTE — Progress Notes (Signed)
Echo Lab  2D Echocardiogram completed.  [place], RDCS 03/17/2013 10:18 AM

## 2013-03-18 LAB — GLUCOSE, CAPILLARY
GLUCOSE-CAPILLARY: 85 mg/dL (ref 70–99)
Glucose-Capillary: 112 mg/dL — ABNORMAL HIGH (ref 70–99)
Glucose-Capillary: 132 mg/dL — ABNORMAL HIGH (ref 70–99)
Glucose-Capillary: 145 mg/dL — ABNORMAL HIGH (ref 70–99)

## 2013-03-18 MED ORDER — AMLODIPINE BESYLATE 10 MG PO TABS
10.0000 mg | ORAL_TABLET | Freq: Every day | ORAL | Status: DC
  Administered 2013-03-18 – 2013-03-19 (×2): 10 mg via ORAL
  Filled 2013-03-18 (×2): qty 1

## 2013-03-18 MED ORDER — CLONIDINE HCL 0.3 MG PO TABS: 0.3000 mg | ORAL_TABLET | Freq: Three times a day (TID) | ORAL | Status: DC

## 2013-03-18 MED ORDER — SODIUM CHLORIDE 0.9 % IV SOLN
INTRAVENOUS | Status: DC
  Administered 2013-03-18: 23:00:00 via INTRAVENOUS
  Administered 2013-03-19: 250 mL via INTRAVENOUS

## 2013-03-18 MED ORDER — CLONIDINE HCL 0.2 MG PO TABS
0.2000 mg | ORAL_TABLET | Freq: Once | ORAL | Status: AC
  Administered 2013-03-18: 0.2 mg via ORAL
  Filled 2013-03-18: qty 1

## 2013-03-18 MED ORDER — CLONIDINE HCL 0.3 MG PO TABS
0.3000 mg | ORAL_TABLET | Freq: Three times a day (TID) | ORAL | Status: DC
  Administered 2013-03-19 (×2): 0.3 mg via ORAL
  Filled 2013-03-18 (×3): qty 1

## 2013-03-18 NOTE — Progress Notes (Signed)
Physical Therapy Treatment Patient Details Name: Jon [name] MRN: [id number] DOB: 1950/12/01 Today's Date: 03/18/2013 Time: [id number] PT Time Calculation (min): 27 min  PT Assessment / Plan / Recommendation  History of Present Illness 63 y.o. male with a history of hypertension, hyperlipidemia who presents with sudden onset right-sided numbness and difficulty with word finding that started abruptly while eating dinner. He was evaluated at [facility] and the decision was made to give t-PA there. Pt with acute Lt MCA infarct.    PT Comments   Patient able to progress with ambulation this session. Several times during session, patient began to shutter with speech and was unable to get his words out. After brief rest patient would be able to form sentences. RN was made aware. Patients wife stated that this has been happening since admission.   Follow Up Recommendations  Outpatient PT;Supervision for mobility/OOB     Does the patient have the potential to tolerate intense rehabilitation     Barriers to Discharge        Equipment Recommendations  None recommended by PT    Recommendations for Other Services    Frequency Min 4X/week   Progress towards PT Goals Progress towards PT goals: Progressing toward goals  Plan Current plan remains appropriate    Precautions / Restrictions Precautions Precautions: Fall Precaution Comments: pt with PLIF in December    Pertinent Vitals/Pain Denied pain   Mobility  Bed Mobility Overal bed mobility: Modified Independent Transfers Sit to Stand: Supervision General transfer comment: slightly unsteady with stand but patient reports as normal due to back pain and surgeries.  Ambulation/Gait Ambulation/Gait assistance: Min guard Ambulation Distance (Feet): 300 Feet Assistive device: Straight cane Gait Pattern/deviations: Step-through pattern;Decreased stride length Gait velocity: decreased; cautious General Gait Details: Patient mildly  unsteady with ambulation. Patient states he just feels weak from being out of bed. No LOB or increased drift noted in any direction Modified Rankin (Stroke Patients Only) Pre-Morbid Rankin Score: No symptoms Modified Rankin: Moderately severe disability    Exercises     PT Diagnosis:    PT Problem List:   PT Treatment Interventions:     PT Goals (current goals can now be found in the care plan section)    Visit Information  Last PT Received On: 03/18/13 Assistance Needed: +1 History of Present Illness: 63 y.o. male with a history of hypertension, hyperlipidemia who presents with sudden onset right-sided numbness and difficulty with word finding that started abruptly while eating dinner. He was evaluated at [facility] and the decision was made to give t-PA there. Pt with acute Lt MCA infarct.     Subjective Data      Cognition  Cognition Arousal/Alertness: Awake/alert Behavior During Therapy: WFL for tasks assessed/performed Overall Cognitive Status: Within Functional Limits for tasks assessed    Balance     End of Session PT - End of Session Equipment Utilized During Treatment: Gait belt Activity Tolerance: Patient tolerated treatment well Patient left: in chair;with call bell/phone within reach;with family/visitor present;with chair alarm set Nurse Communication: Mobility status;Precautions   GP     Jacqualyn Posey 03/18/2013, 9:46 AM 03/18/2013 Jacqualyn Posey PTA [phone] pager [phone] office

## 2013-03-18 NOTE — Progress Notes (Signed)
    [facility] has been requested to perform a transesophageal echocardiogram on 03/19/13 for CVA rule out.  After careful review of history and examination, the risks and benefits of transesophageal echocardiogram have been explained including risks of esophageal damage, perforation (1:10,000 risk), bleeding, pharyngeal hematoma as well as other potential complications associated with conscious sedation including aspiration, arrhythmia, respiratory failure and death. Alternatives to treatment were discussed, questions were answered. Patient is willing to proceed.   Perry Mount, NP 03/18/2013 6:35 PM

## 2013-03-18 NOTE — Evaluation (Signed)
Speech Language Pathology Evaluation Patient Details Name: Jon [name] MRN: [id number] DOB: 1950/05/13 Today's Date: 03/18/2013 Time: [id number] SLP Time Calculation (min): 18 min  Problem List:  Patient Active Problem List   Diagnosis Date Noted  . Stroke 03/16/2013  . Movement disorder 03/16/2013  . Lumbar stenosis with neurogenic claudication 12/27/2012  . Chronic back pain 01/27/2011  . Tobacco use disorder 01/27/2011  . HTN (hypertension) 01/27/2011  . Chronic kidney disease 01/24/2011  . Hyperkalemia 01/23/2011  . Diabetes mellitus 01/23/2011  . Lower back pain 01/23/2011   Past Medical History:  Past Medical History  Diagnosis Date  . Hypertension     takes Amlodipine and Catapres daily  . Hyperlipidemia     takes Lovastatin daily  . Sleep apnea   . History of bronchitis     last time at least 71yrs ago  . Pneumonia     last time about 11yrs ago  . Arthritis   . Chronic back pain     radiculopathy and stenosis  . History of colon polyps   . H/O hiatal hernia   . Urinary frequency   . Peripheral edema     takes Lasix daily  . Nocturia   . Diabetes mellitus     Levemir nightly ;type 2   Past Surgical History:  Past Surgical History  Procedure Laterality Date  . Right leg surgery      pin in place  . Neuroplasty / transposition median nerve at carpal tunnel bilateral    . Back surgery    . Right wrist surgery    . Left knee surgery    . Hemorrhoid surgery    . Colonoscopy    . Esophagogastroduodenoscopy     HPI:  63 y.o. male with a history of hypertension, hyperlipidemia who presents with sudden onset right-sided numbness and difficulty with word finding that started abruptly while eating dinner. He was evaluated at [facility] and the decision was made to give t-PA there. He was then transferred to [facility]. PMH:  chronic kidney disease, NYN, bronchitis, DM, hiatal hernia.  MRI Acute left MCA infarct.   Assessment / Plan / Recommendation Clinical  Impression  Pt. exhibits mild neurogenic dysfluency in conversation characterized by phonemic prolongations and part word repetitions.  Mild and intermittent dysnomia demonstrated during expression of abstract thoughts.  Cognition appeared intact.  SLP educated pt. and wife regarding strategies to improve dysnomia and fluency.  Pt. would benefit from continued ST in acute care and at outpatient facility.     SLP Assessment  Patient needs continued Speech Lanaguage Pathology Services    Follow Up Recommendations  Outpatient SLP    Frequency and Duration min 2x/week  2 weeks   Pertinent Vitals/Pain WDL   SLP Goals  SLP Goals Potential to Achieve Goals: Good  SLP Evaluation Prior Functioning  Cognitive/Linguistic Baseline: Within functional limits Type of Home: House  Lives With: Spouse Vocation: Unemployed (truck driver)   Cognition  Overall Cognitive Status: Within Functional Limits for tasks assessed Orientation Level: Oriented X4    Comprehension  Auditory Comprehension Overall Auditory Comprehension: Appears within functional limits for tasks assessed Visual Recognition/Discrimination Discrimination: Not tested Reading Comprehension Reading Status:  (TBA)    Expression Expression Primary Mode of Expression: Verbal Verbal Expression Overall Verbal Expression: Impaired Initiation: No impairment Level of Generative/Spontaneous Verbalization: Conversation Repetition: No impairment Naming: No impairment (min dysnomia in conversation) Pragmatics: No impairment Other Verbal Expression Comments:  (dysfluency) Written Expression Dominant Hand: Right Written Expression:  (  TBA)   Oral / Motor Oral Motor/Sensory Function Overall Oral Motor/Sensory Function: Appears within functional limits for tasks assessed Motor Speech Overall Motor Speech: Impaired Respiration: Within functional limits Phonation: Normal Resonance: Within functional limits Articulation: Within  functional limitis Intelligibility: Intelligible Motor Planning: Impaired Level of Impairment: Conversation   GO     Orbie Pyo [employer] [phone]  03/18/2013

## 2013-03-18 NOTE — Progress Notes (Addendum)
Stroke Team Progress Note  HISTORY 63 y.o. male with a history of hypertension, hyperlipidemia who presents with sudden onset right-sided numbness and difficulty with word finding that started abruptly while eating dinner. He was evaluated at [facility] and the decision was made to give t-PA there. He was then transferred to [facility].   He states that he has never had anything like this before. Since arrival, he has had several episodes of right arm shaking. He denies being able to suppress these. S/p Keppra 1gm. TPA at 16:44.   SUBJECTIVE His wife is at the bedside.   OBJECTIVE Most recent Vital Signs: Filed Vitals:   03/17/13 2212 03/18/13 0154 03/18/13 0630 03/18/13 0930  BP: 172/74 134/56 151/57 185/86  Pulse: 60 63 64 62  Temp: 99.3 F (37.4 C) 98.6 F (37 C) 98.8 F (37.1 C) 98.3 F (36.8 C)  TempSrc: Oral Oral Oral Oral  Resp: 18 18 18 18   Height:      Weight:      SpO2: 100% 100% 100% 100%   CBG (last 3)   Recent Labs  03/17/13 1544 03/17/13 2214 03/18/13 0632  GLUCAP 164* 174* 85    IV Fluid Intake:      MEDICATIONS  . aspirin EC  325 mg Oral Daily  . calcitRIOL  0.25 mcg Oral Daily  . cloNIDine  0.1 mg Oral TID  . enoxaparin (LOVENOX) injection  30 mg Subcutaneous Q24H  . furosemide  80 mg Oral BID  . insulin aspart  2-6 Units Subcutaneous TID WC & HS  . insulin glargine  30 Units Subcutaneous QHS  . simvastatin  20 mg Oral q1800  . tamsulosin  0.4 mg Oral QPC breakfast   PRN:  acetaminophen, acetaminophen, HYDROcodone-acetaminophen  Diet:  Carb Control thin liquids Activity:  Up with assistance DVT Prophylaxis:  SCDs  CLINICALLY SIGNIFICANT STUDIES Basic Metabolic Panel:   Recent Labs Lab 03/16/13 0302  NA 145  K 4.7  CL 110  CO2 20  GLUCOSE 124*  BUN 49*  CREATININE 4.71*  CALCIUM 8.4   Liver Function Tests:   Recent Labs Lab 03/16/13 0302  AST 9  ALT 11  ALKPHOS 62  BILITOT 0.2*  PROT 6.7  ALBUMIN 3.3*   CBC:   Recent  Labs Lab 03/16/13 0302  WBC 7.5  NEUTROABS 4.9  HGB 9.0*  HCT 26.8*  MCV 87.6  PLT 115*   Coagulation: No results found for this basename: LABPROT, INR,  in the last 168 hours Cardiac Enzymes: No results found for this basename: CKTOTAL, CKMB, CKMBINDEX, TROPONINI,  in the last 168 hours Urinalysis: No results found for this basename: COLORURINE, APPERANCEUR, LABSPEC, PHURINE, GLUCOSEU, HGBUR, BILIRUBINUR, KETONESUR, PROTEINUR, UROBILINOGEN, NITRITE, LEUKOCYTESUR,  in the last 168 hours Lipid Panel    Component Value Date/Time   CHOL 116 03/16/2013 0302   TRIG 98 03/16/2013 0302   HDL 34* 03/16/2013 0302   CHOLHDL 3.4 03/16/2013 0302   VLDL 20 03/16/2013 0302   LDLCALC 62 03/16/2013 0302   HgbA1C  Lab Results  Component Value Date   HGBA1C 7.0* 03/16/2013    Urine Drug Screen:   No results found for this basename: labopia,  cocainscrnur,  labbenz,  amphetmu,  thcu,  labbarb    Alcohol Level: No results found for this basename: ETH,  in the last 168 hours  CT of the brain    MRI of the brain 03/16/2013    Acute left MCA infarct that is cortical in nature  MRA of the brain  03/16/2013   No evidence of  major intracranial arterial occlusion.  2D Echocardiogram  EF 55-60% with no source of embolus.   Carotid Doppler  No evidence of hemodynamically significant internal carotid artery stenosis. Vertebral artery flow is antegrade.   CXR  03/16/2013  Stable mild cardiomegaly.  No acute chest findings.   EKG sinus  EEG normal EEG recording during wakefulness and during sleep. No evidence of an epileptic disorder was demonstrated.   Therapy Recommendations OP PT  Physical Exam   General: In bed, NAD  CV: Regular rate and rhythm  Mental Status:  Patient is awake, alert, oriented to person, place, month, year, and situation.  Immediate and remote memory are intact.  Patient is able to give a clear and coherent history.  No signs of neglect. He has a mild expressive aphasia.  Cranial  Nerves:  II: Visual Fields are full. Pupils are equal, round, and reactive to light. Discs are difficult to visualize.  III,IV, VI: EOMI without ptosis or diploplia.  V: Facial sensation is diminished on right to temperature  VII: Facial movement is symmetric.  VIII: hearing is intact to voice  X: Uvula elevates symmetrically  XI: Shoulder shrug is symmetric.  XII: tongue is midline without atrophy or fasciculations.  Motor:  Tone is normal. Bulk is normal. 5/5 b/l decreased grip RUE Sensory:  Sensation is diminished throughout the right side  Deep Tendon Reflexes:  2+ and symmetric in the biceps and patellae.  Cerebellar:  FNF intact bilaterally, though slower on the right than left  Gait:  Not tested    ASSESSMENT [name] is a 63 y.o. male with a history of hypertension, hyperlipidemia who presents with right-sided weakness and expressive aphasia of sudden onset. S/p IV tPA at [facility] and shipped to [facility] for care. Imaging  confirms a new left cortical MCA ischemic stroke. Infarct if felt to be embolic secondary to unknown source. Patient was on aspirin 81 mg daily prior to admission. Now on aspirin 325 mg pt daily for secondary stroke prevention. Patient with resultant right hemiparesis, mild expressive aphasia.   hypertension  Hyperlipidemia, LDL 62, on mevacor PTA, now on zocor 20 mg, goal LDL < 70 for diabetics  Diabetes, HgbA1c 7.0, goal < 7.0  obstructive sleep apnea    New right arm shaking  EEG negative   One time dose keppra administered   Wife reports recent finding of heart murmur, wife reports a consult with Dr. Bridgett Larsson shceulded Fri  Kidney disease stage IV, plans underway to eval for kidney transplant   Chronic back pain  Hospital day # 3  TREATMENT/PLAN  Continue aspirin 325 mg by mouth daily for secondary stroke prevention  Resume home BP medications  TEE to look for embolic source. Arranged with [place] for tomorrow.  If positive for PFO (patent foramen ovale), check bilateral lower extremity venous dopplers to rule out DVT as possible source of stroke. (I have made patient NPO after midnight tonight).  If TEE negative, a [place] electrophysiologist will consult and consider placement of an place implantable loop recorder to evaluate for atrial fibrillation as etiology of stroke. This has been explained to patient/family by Dr. Leonie Man and they are agreeable.  OP PT  Burnetta Sabin, MSN, RN, ANVP-BC, ANP-BC, GNP-BC [facility] Pager: [phone] 03/18/2013 11:19 AM  I have personally obtained a history, examined the patient, evaluated imaging results,  and formulated the assessment and plan of care. I agree with the above. Antony Contras, MD

## 2013-03-18 NOTE — Progress Notes (Signed)
UR COMPLETED  

## 2013-03-18 NOTE — Evaluation (Signed)
Occupational Therapy Evaluation Patient Details Name: Jon [name] MRN: [id number] DOB: [birth year] Today's Date: 03/18/2013 Time: [id number] OT Time Calculation (min): 23 min  OT Assessment / Plan / Recommendation History of present illness 63 y.o. male with a history of hypertension, hyperlipidemia who presents with sudden onset right-sided numbness and difficulty with word finding that started abruptly while eating dinner. He was evaluated at [facility] and the decision was made to give t-PA there. Pt with acute Lt MCA infarct.    Clinical Impression   Patient evaluated by Occupational Therapy with no further acute OT needs identified. All education has been completed and the patient has no further questions. Pt is close to baseline.  He demonstrates mild weakness Rt. UE and impaired light sensation Rt. UE.  Education completed.   See below for any follow-up Occupational Therapy or equipment needs. OT is signing off. Thank you for this referral.     OT Assessment  Patient does not need any further OT services    Follow Up Recommendations  No OT follow up;Supervision - Intermittent    Barriers to Discharge      Equipment Recommendations  None recommended by OT    Recommendations for Other Services    Frequency       Precautions / Restrictions Precautions Precautions: Fall Precaution Comments: pt with PLIF in December  Restrictions Weight Bearing Restrictions: No   Pertinent Vitals/Pain     ADL  Eating/Feeding: Modified independent Where Assessed - Eating/Feeding: Chair Grooming: Wash/dry hands;Wash/dry face;Teeth care;Brushing hair;Supervision/safety Where Assessed - Grooming: Unsupported standing Upper Body Bathing: Set up Where Assessed - Upper Body Bathing: Unsupported sitting Lower Body Bathing: Supervision/safety Where Assessed - Lower Body Bathing: Unsupported sit to stand Upper Body Dressing: Set up Where Assessed - Upper Body Dressing: Unsupported  sitting Lower Body Dressing: Supervision/safety Where Assessed - Lower Body Dressing: Unsupported sit to stand Toilet Transfer: Supervision/safety Toilet Transfer Method: Sit to stand;Stand pivot Science writer: Comfort height toilet Toileting - Clothing Manipulation and Hygiene: Supervision/safety Where Assessed - Best boy and Hygiene: Sit to stand from 3-in-1 or toilet Equipment Used: Cane Transfers/Ambulation Related to ADLs: S ADL Comments: Pt requires supervision due to mildly decreased balance.     OT Diagnosis:    OT Problem List:   OT Treatment Interventions:     OT Goals(Current goals can be found in the care plan section)    Visit Information  Last OT Received On: 03/18/13 Assistance Needed: +1 History of Present Illness: 63 y.o. male with a history of hypertension, hyperlipidemia who presents with sudden onset right-sided numbness and difficulty with word finding that started abruptly while eating dinner. He was evaluated at [facility] and the decision was made to give t-PA there. Pt with acute Lt MCA infarct.        Prior [place] expects to be discharged to:: Private residence Living Arrangements: Spouse/significant other Available Help at Discharge: Family;Available 24 hours/day Type of Home: House Home Access: Ramped entrance Home Layout: One level Home Equipment: Bedside commode;Walker - 2 wheels;Cane - single point;Crutches  Lives With: Spouse Prior Function Level of Independence: Independent Comments: pt drives and is independent with ADLs; ambulates with SPC  Communication Communication: No difficulties Dominant Hand: Right         Vision/Perception Vision - History Baseline Vision: Wears glasses all the time Patient Visual Report: No change from baseline Vision - Assessment Eye Alignment: Within Functional Limits Vision Assessment: Vision tested  Ocular Range of  Motion: Within Functional Limits Tracking/Visual Pursuits: Able to track stimulus in all quads without difficulty Visual Fields: No apparent deficits Perception Perception: Within Functional Limits Praxis Praxis: Intact   Cognition  Cognition Arousal/Alertness: Awake/alert Behavior During Therapy: WFL for tasks assessed/performed Overall Cognitive Status: Within Functional Limits for tasks assessed    Extremity/Trunk Assessment Upper Extremity Assessment Upper Extremity Assessment: RUE deficits/detail RUE Deficits / Details: strength 4/5 RUE Sensation: decreased light touch Lower Extremity Assessment Lower Extremity Assessment: Defer to PT evaluation     Mobility Transfers Overall transfer level: Needs assistance Equipment used: None;Straight cane Transfers: Sit to/from [facility] Sit to Stand: Supervision Stand pivot transfers: Supervision General transfer comment: Wife and pt repoort he is at or close to his baseline with balance, and mobility      Exercise     Balance Balance Overall balance assessment: Needs assistance Standing balance support: Single extremity supported Standing balance-Leahy Scale: Fair General Comments General comments (skin integrity, edema, etc.): Discussed sensory loss with pt and wife, and instructed him to be very careful around sharp, and hot objects as well as power tools.  They both verbalized understanding    End of Session OT - End of Session Activity Tolerance: Patient tolerated treatment well Patient left: in chair;with call bell/phone within reach;with family/visitor present  Archuleta, Ellard Artis M 03/18/2013, 5:06 PM

## 2013-03-19 ENCOUNTER — Encounter (HOSPITAL_COMMUNITY)
Admission: AD | Disposition: A | Discharge status: Home / Self Care | Payer: Self-pay | Source: Other Acute Inpatient Hospital | Attending: Neurology

## 2013-03-19 ENCOUNTER — Encounter (HOSPITAL_COMMUNITY): Payer: Self-pay

## 2013-03-19 DIAGNOSIS — I6350 Cerebral infarction due to unspecified occlusion or stenosis of unspecified cerebral artery: Secondary | ICD-10-CM

## 2013-03-19 DIAGNOSIS — I6789 Other cerebrovascular disease: Secondary | ICD-10-CM

## 2013-03-19 DIAGNOSIS — I10 Essential (primary) hypertension: Secondary | ICD-10-CM

## 2013-03-19 HISTORY — PX: LOOP RECORDER IMPLANT: SHX5477

## 2013-03-19 HISTORY — PX: TEE WITHOUT CARDIOVERSION: SHX5443

## 2013-03-19 HISTORY — PX: LOOP RECORDER IMPLANT: SHX5954

## 2013-03-19 LAB — GLUCOSE, CAPILLARY
Glucose-Capillary: 93 mg/dL (ref 70–99)
Glucose-Capillary: 117 mg/dL — ABNORMAL HIGH (ref 70–99)
Glucose-Capillary: 201 mg/dL — ABNORMAL HIGH (ref 70–99)

## 2013-03-19 SURGERY — LOOP RECORDER IMPLANT
Anesthesia: LOCAL

## 2013-03-19 SURGERY — ECHOCARDIOGRAM, TRANSESOPHAGEAL
Anesthesia: Moderate Sedation

## 2013-03-19 MED ORDER — FENTANYL CITRATE 0.05 MG/ML IJ SOLN
INTRAMUSCULAR | Status: AC
Start: 2013-03-19 — End: 2013-03-19
  Filled 2013-03-19: qty 2

## 2013-03-19 MED ORDER — BUTAMBEN-TETRACAINE-BENZOCAINE 2-2-14 % EX AERO
INHALATION_SPRAY | CUTANEOUS | Status: DC | PRN
  Administered 2013-03-19: 2 via TOPICAL

## 2013-03-19 MED ORDER — FENTANYL CITRATE 0.05 MG/ML IJ SOLN
INTRAMUSCULAR | Status: DC | PRN
  Administered 2013-03-19: 25 ug via INTRAVENOUS

## 2013-03-19 MED ORDER — LIDOCAINE-EPINEPHRINE 1 %-1:100000 IJ SOLN
INTRAMUSCULAR | Status: AC
  Filled 2013-03-19: qty 1

## 2013-03-19 MED ORDER — MIDAZOLAM HCL 10 MG/2ML IJ SOLN
INTRAMUSCULAR | Status: DC | PRN
  Administered 2013-03-19: 1 mg via INTRAVENOUS
  Administered 2013-03-19: 2 mg via INTRAVENOUS

## 2013-03-19 MED ORDER — ASPIRIN 325 MG PO TBEC: 325.0000 mg | DELAYED_RELEASE_TABLET | Freq: Every day | ORAL | Status: DC

## 2013-03-19 MED ORDER — MIDAZOLAM HCL 5 MG/ML IJ SOLN
INTRAMUSCULAR | Status: AC
  Filled 2013-03-19: qty 2

## 2013-03-19 NOTE — Progress Notes (Signed)
Physical Therapy Treatment Patient Details Name: Jon [name] MRN: [id number] DOB: Jun 26, 1950 Today's Date: 03/19/2013 Time: [id number] PT Time Calculation (min): 15 min  PT Assessment / Plan / Recommendation  History of Present Illness 63 y.o. male with a history of hypertension, hyperlipidemia who presents with sudden onset right-sided numbness and difficulty with word finding that started abruptly while eating dinner. He was evaluated at [facility] and the decision was made to give t-PA there. Pt with acute Lt MCA infarct.    PT Comments   Patient continues to make great progress with therapy. Encouraged to walk with wife or staff as tolerated. Anticipate DC later today  Follow Up Recommendations  Outpatient PT;Supervision for mobility/OOB     Does the patient have the potential to tolerate intense rehabilitation     Barriers to Discharge        Equipment Recommendations  None recommended by PT    Recommendations for Other Services    Frequency Min 4X/week   Progress towards PT Goals Progress towards PT goals: Progressing toward goals  Plan Current plan remains appropriate    Precautions / Restrictions Precautions Precautions: Fall Precaution Comments: pt with PLIF in December    Pertinent Vitals/Pain Denied pain    Mobility  Bed Mobility Overal bed mobility: Independent Transfers Overall transfer level: Modified independent Ambulation/Gait Ambulation/Gait assistance: Supervision Ambulation Distance (Feet): 600 Feet Assistive device: Straight cane Gait Pattern/deviations: Step-through pattern Gait velocity interpretation: at or above normal speed for age/gender General Gait Details: Increased steadiness with ambulation this session.  Modified Rankin (Stroke Patients Only) Pre-Morbid Rankin Score: No symptoms Modified Rankin: Moderately severe disability    Exercises     PT Diagnosis:    PT Problem List:   PT Treatment Interventions:     PT Goals  (current goals can now be found in the care plan section)    Visit Information  Last PT Received On: 03/19/13 Assistance Needed: +1 History of Present Illness: 62 y.o. male with a history of hypertension, hyperlipidemia who presents with sudden onset right-sided numbness and difficulty with word finding that started abruptly while eating dinner. He was evaluated at [facility] and the decision was made to give t-PA there. Pt with acute Lt MCA infarct.     Subjective Data      Cognition  Cognition Arousal/Alertness: Awake/alert Behavior During Therapy: WFL for tasks assessed/performed Overall Cognitive Status: Within Functional Limits for tasks assessed    Balance     End of Session PT - End of Session Equipment Utilized During Treatment: Gait belt Activity Tolerance: Patient tolerated treatment well Patient left: in chair;with call bell/phone within reach;with family/visitor present;with chair alarm set Nurse Communication: Mobility status;Precautions   GP     Jacqualyn Posey 03/19/2013, 7:56 AM 03/19/2013 Jacqualyn Posey PTA [phone] pager [phone] office

## 2013-03-19 NOTE — Progress Notes (Signed)
Talked to patient about outpatient physical therapy at discharge; patient does not want therapy at this time; CM informed patient that if he changed his mind his PCP can make arrangements for outpatient therapy; Aneta Mins [id number]

## 2013-03-19 NOTE — Interval H&P Note (Signed)
History and Physical Interval Note:  03/19/2013 12:44 PM  Jon [name]  has presented today for surgery, with the diagnosis of STROKE  The various methods of treatment have been discussed with the patient and family. After consideration of risks, benefits and other options for treatment, the patient has consented to  Procedure(s): TRANSESOPHAGEAL ECHOCARDIOGRAM (TEE) (N/A) as a surgical intervention .  The patient's history has been reviewed, patient examined, no change in status, stable for surgery.  I have reviewed the patient's chart and labs.  Questions were answered to the patient's satisfaction.     Kirk Ruths

## 2013-03-19 NOTE — Op Note (Signed)
SURGEON:  Thompson Grayer, MD     PREPROCEDURE DIAGNOSIS:  Cryptogenic Stroke    POSTPROCEDURE DIAGNOSIS:  Cryptogenic Stroke     PROCEDURES:   1. Implantable loop recorder implantation    INTRODUCTION:  Jon [name] is a 63 y.o. male with a history of unexplained stroke who presents today for implantable loop implantation.  The patient has had a cryptogenic stroke.  Despite an extensive workup by neurology, no reversible causes have been identified.  he has worn telemetry during which he did not have arrhythmias.  There is significant concern for possible atrial fibrillation as the cause for the patients stroke.  The patient therefore presents today for implantable loop implantation.     DESCRIPTION OF PROCEDURE:  Informed written consent was obtained, and the patient was brought to the electrophysiology lab in a fasting state.  The patient required no sedation for the procedure today.  Mapping over the patient's chest was performed by the EP lab staff to identify the area where electrograms were most prominent for ILR recording.  This area was found to be the left parasternal region over the 3rd-4th intercostal space. The patients left chest was therefore prepped and draped in the usual sterile fashion by the EP lab staff. The skin overlying the left parasternal region was infiltrated with lidocaine for local analgesia.  A 0.5-cm incision was made over the left parasternal region over the 3rd intercostal space.  A subcutaneous ILR pocket was fashioned using a combination of sharp and blunt dissection.  A Medtronic Reveal Medill model [id number] implantable loop recorder was then placed into the pocket  R waves were very prominent and measured 0.42mV.  Steri- Strips and a sterile dressing were then applied.  There were no early apparent complications.     CONCLUSIONS:   1. Successful implantation of a Medtronic Reveal LINQ implantable loop recorder for cryptogenic stroke  2. No early apparent  complications.

## 2013-03-19 NOTE — Discharge Summary (Signed)
Stroke Discharge Summary  Patient ID: Jon [name]   MRN: [id number]      DOB: 03/17/1950  Date of Admission: 03/15/2013 Date of Discharge: 03/19/2013  Attending Physician:  Suzzanne Cloud, MD, Stroke MD  Consulting Physician(s):   Treatment Team:  [facility] Stroke, MD Thompson Grayer, MD   Patient's PCP:  [name]., MD  Discharge Diagnoses:   Active Problems: Stroke - left cortical MCA infarct, embolic secondary to unknown source., s/p IV tPA hypertension  Hyperlipidemia Diabetes obstructive sleep apnea  New right arm shaking Kidney disease stage IV Movement disorder Obesity, BMI: Body mass index is 32.5 kg/(m^2).  Past Medical History  Diagnosis Date  . Hypertension     takes Amlodipine and Catapres daily  . Hyperlipidemia     takes Lovastatin daily  . Sleep apnea   . History of bronchitis     last time at least 69yrs ago  . Pneumonia     last time about 76yrs ago  . Arthritis   . Chronic back pain     radiculopathy and stenosis  . History of colon polyps   . H/O hiatal hernia   . Urinary frequency   . Peripheral edema     takes Lasix daily  . Nocturia   . Diabetes mellitus     Levemir nightly ;type 2   Past Surgical History  Procedure Laterality Date  . Right leg surgery      pin in place  . Neuroplasty / transposition median nerve at carpal tunnel bilateral    . Back surgery    . Right wrist surgery    . Left knee surgery    . Hemorrhoid surgery    . Colonoscopy    . Esophagogastroduodenoscopy        Medication List         amLODipine 10 MG tablet  Commonly known as:  NORVASC  Take 10 mg by mouth daily.     aspirin 325 MG EC tablet  Take 1 tablet (325 mg total) by mouth daily.     calcitRIOL 0.25 MCG capsule  Commonly known as:  ROCALTROL  Take 0.25 mcg by mouth daily.     CALCIUM CITRATE +D PO  Take 1 tablet by mouth 2 (two) times daily.     cloNIDine 0.3 MG tablet  Commonly known as:  CATAPRES  Take 0.3 mg by mouth 3  (three) times daily.     cyclobenzaprine 10 MG tablet  Commonly known as:  FLEXERIL  Take 10 mg by mouth 3 (three) times daily as needed for muscle spasms.     furosemide 80 MG tablet  Commonly known as:  LASIX  Take 80 mg by mouth 2 (two) times daily.     HYDROcodone-acetaminophen 10-325 MG per tablet  Commonly known as:  NORCO  Take 1 tablet by mouth every 4 (four) hours as needed for moderate pain.     insulin glargine 100 UNIT/ML injection  Commonly known as:  LANTUS  Inject 30 Units into the skin at bedtime.     lovastatin 40 MG tablet  Commonly known as:  MEVACOR  Take 40 mg by mouth at bedtime.     tamsulosin 0.4 MG Caps capsule  Commonly known as:  FLOMAX  Take 0.4 mg by mouth daily after breakfast.        LABORATORY STUDIES CBC    Component Value Date/Time   WBC 7.5 03/16/2013 0302   RBC 3.06* 03/16/2013  0302   HGB 9.0* 03/16/2013 0302   HCT 26.8* 03/16/2013 0302   PLT 115* 03/16/2013 0302   MCV 87.6 03/16/2013 0302   MCH 29.4 03/16/2013 0302   MCHC 33.6 03/16/2013 0302   RDW 14.0 03/16/2013 0302   LYMPHSABS 1.7 03/16/2013 0302   MONOABS 0.5 03/16/2013 0302   EOSABS 0.5 03/16/2013 0302   BASOSABS 0.0 03/16/2013 0302   CMP    Component Value Date/Time   NA 145 03/16/2013 0302   K 4.7 03/16/2013 0302   CL 110 03/16/2013 0302   CO2 20 03/16/2013 0302   GLUCOSE 124* 03/16/2013 0302   BUN 49* 03/16/2013 0302   CREATININE 4.71* 03/16/2013 0302   CALCIUM 8.4 03/16/2013 0302   PROT 6.7 03/16/2013 0302   ALBUMIN 3.3* 03/16/2013 0302   AST 9 03/16/2013 0302   ALT 11 03/16/2013 0302   ALKPHOS 62 03/16/2013 0302   BILITOT 0.2* 03/16/2013 0302   GFRNONAA 12* 03/16/2013 0302   GFRAA 14* 03/16/2013 0302   COAGS No results found for this basename: INR, PROTIME   Lipid Panel    Component Value Date/Time   CHOL 116 03/16/2013 0302   TRIG 98 03/16/2013 0302   HDL 34* 03/16/2013 0302   CHOLHDL 3.4 03/16/2013 0302   VLDL 20 03/16/2013 0302   LDLCALC 62 03/16/2013 0302   HgbA1C  Lab Results  Component Value Date    HGBA1C 7.0* 03/16/2013   Cardiac Panel (last 3 results) No results found for this basename: CKTOTAL, CKMB, TROPONINI, RELINDX,  in the last 72 hours Urinalysis    Component Value Date/Time   COLORURINE YELLOW 01/23/2011 [place] 01/23/2011 0551   LABSPEC 1.019 01/23/2011 0551   PHURINE 5.0 01/23/2011 0551   GLUCOSEU 100* 01/23/2011 0551   HGBUR SMALL* 01/23/2011 [place] 01/23/2011 0551   [place] 01/23/2011 0551   PROTEINUR >300* 01/23/2011 0551   UROBILINOGEN 0.2 01/23/2011 0551   NITRITE NEGATIVE 01/23/2011 0551   LEUKOCYTESUR NEGATIVE 01/23/2011 0551   Urine Drug Screen  No results found for this basename: labopia, cocainscrnur, labbenz, amphetmu, thcu, labbarb    Alcohol Level No results found for this basename: eth    SIGNIFICANT DIAGNOSTIC STUDIES CT of the brain  MRI of the brain 03/16/2013 Acute left MCA infarct that is cortical in nature  MRA of the brain 03/16/2013 No evidence of  major intracranial arterial occlusion.  2D Echocardiogram EF 55-60% with no source of embolus.  Carotid Doppler No evidence of hemodynamically significant internal carotid artery stenosis. Vertebral artery flow is antegrade.  TEE 03/19/2013  CXR 03/16/2013 Stable mild cardiomegaly. No acute chest findings.  EKG sinus   EEG normal EEG recording during wakefulness and during sleep. No evidence of an epileptic disorder was demonstrated.      History of Present Illness  Jon [name] is a 63 y.o. male with a history of hypertension, hyperlipidemia who presents with sudden onset right-sided numbness and difficulty with word finding that started abruptly while eating dinner. He was evaluated at [facility] and the decision was made to give t-PA there. He was then transferred to [facility]. He states that he has never had anything like this before. Since arrival, he has had several episodes of right arm shaking. He denies being able to suppress these. S/p Keppra 1gm.  S/p IV tPA at [facility] and shipped to [facility] for care.    Hospital Course Patient tolerated tPA without complication. Imaging at 24  hours shows no hemorrhage. MRI confirmed ischemic infarct in the left cortical MCA territory. Stroke was found to be embolic secondary to unknown source. TEE was negative. Loop recorder was placed to look for atrial fibrillation over time as source of his stroke. He was on  aspirin 81 mg orally every day prior to admission. He was changed to aspirin 325 mg orally every day for secondary stroke prevention.   Patient with vascular risk factors of:  hypertension  Hyperlipidemia, LDL 62, on mevacor PTA, now on zocor 20 mg, goal LDL < 70 for diabetics  Diabetes, HgbA1c 7.0, goal < 7.0  obstructive sleep apnea   Patient also with: New right arm shaking, EEG negative, One time dose keppra administered  Wife reports recent finding of heart murmur, wife reports a consult with Dr. Bridgett Larsson shceulded Fri  Kidney disease stage IV, plans underway to eval for kidney transplant  Chronic back pain  Patient with resultant right hemiparesis, mild expressive aphasia. Physical therapy, occupational therapy and speech therapy evaluated patient. They recommend OP PT, which patient refused  Discharge Exam  Blood pressure 136/78, pulse 72, temperature 98.2 F (36.8 C), temperature source Oral, resp. rate 16, height 5\' 11"  (1.803 m), weight 105.643 kg (232 lb 14.4 oz), SpO2 95.00%.  General: In bed, NAD  CV: Regular rate and rhythm  Mental Status:  Patient is awake, alert, oriented to person, place, month, year, and situation.  Immediate and remote memory are intact.  Patient is able to give a clear and coherent history.  No signs of neglect. He has a mild expressive aphasia.  Cranial Nerves:  II: Visual Fields are full. Pupils are equal, round, and reactive to light. Discs are difficult to visualize.  III,IV, VI: EOMI without ptosis or diploplia.  V: Facial sensation is  diminished on right to temperature  VII: Facial movement is symmetric.  VIII: hearing is intact to voice  X: Uvula elevates symmetrically  XI: Shoulder shrug is symmetric.  XII: tongue is midline without atrophy or fasciculations.  Motor:  Tone is normal. Bulk is normal. 5/5 b/l decreased grip RUE  Sensory:  Sensation is diminished throughout the right side  Deep Tendon Reflexes:  2+ and symmetric in the biceps and patellae.  Cerebellar:  FNF intact bilaterally, though slower on the right than left  Gait: Not tested  Discharge Diet   Cardiac thin liquids  Discharge Plan    Disposition:  Home with wife   aspirin 325 mg orally every day for secondary stroke prevention.  Ongoing risk factor control by Primary Care Physician.  Follow-up [facility] Angelique Blonder., MD in 1 month.  Follow-up cardiology for wound check. They will call with appt date/time  Follow-up with Dr. Antony Contras, Stroke Clinic in 2 months.  40 minutes were spent preparing discharge.  Signed Burnetta Sabin, MSN, RN, ANVP-BC, AGPCNP-BC St[facility] Center Nurse Practitioner 03/19/2013, 5:18 PM  I have personally examined this patient, reviewed pertinent data and developed the plan of care. I agree with above.  Antony Contras

## 2013-03-19 NOTE — CV Procedure (Signed)
See full report in [employer]; normal LV function, moderate atherosclerosis descending aorta; sclerotic aortic valve; mild TR, trace MR; negative saline microcavitation study. Jon [name]

## 2013-03-19 NOTE — Progress Notes (Signed)
  Echocardiogram Echocardiogram Transesophageal has been performed.  Jon [name] 03/19/2013, 1:08 PM

## 2013-03-19 NOTE — Consult Note (Addendum)
ELECTROPHYSIOLOGY CONSULT NOTE    Patient ID: Jon [name] MRN: [id number], DOB/AGE: [birth year] 63 y.o.  Admit date: 03/15/2013 Date of Consult: 03-19-13  Primary Physician: [name]., MD Primary Cardiologist: new to [facility]  Reason for Consultation: cryptogenic stroke  HPI:  Jon [name] is a 63 y.o. male with a past medical history of hypertension, hyperlipidemia, chronic kidney disease, sleep apnea, and diabetes.  He presented to [facility] on the day of admission with sudden onset right sided numbness and difficulty with word finding.  He was given tPA and transferred to [facility] for further evaluation.  Imaging demonstrated new left cortical MCA ischemic stroke.  He has undergone stroke workup including TEE that has been unrevealing.  EP has been asked to evaluate for placement of implantable loop recorder to look for atrial fibrillation.  Echo this admission demonstrated EF 55-60%, no RWMA, no valvular abnormalities, LA 35.   He denies chest pain, shortness of breath, dizziness, palpitations, or syncope.  ROS is otherwise negative.    Past Medical History  Diagnosis Date  . Hypertension     takes Amlodipine and Catapres daily  . Hyperlipidemia     takes Lovastatin daily  . Sleep apnea   . History of bronchitis     last time at least 75yrs ago  . Pneumonia     last time about 29yrs ago  . Arthritis   . Chronic back pain     radiculopathy and stenosis  . History of colon polyps   . H/O hiatal hernia   . Urinary frequency   . Peripheral edema     takes Lasix daily  . Nocturia   . Diabetes mellitus     Levemir nightly ;type 2     Surgical History:  Past Surgical History  Procedure Laterality Date  . Right leg surgery      pin in place  . Neuroplasty / transposition median nerve at carpal tunnel bilateral    . Back surgery    . Right wrist surgery    . Left knee surgery    . Hemorrhoid surgery    . Colonoscopy    .  Esophagogastroduodenoscopy       Prescriptions prior to admission  Medication Sig Dispense Refill  . amLODipine (NORVASC) 10 MG tablet Take 10 mg by mouth daily.      [name] aspirin EC 81 MG tablet Take 81 mg by mouth daily.      . calcitRIOL (ROCALTROL) 0.25 MCG capsule Take 0.25 mcg by mouth daily.      . Calcium Citrate-Vitamin D (CALCIUM CITRATE +D PO) Take 1 tablet by mouth 2 (two) times daily.      . cloNIDine (CATAPRES) 0.3 MG tablet Take 0.3 mg by mouth 3 (three) times daily.      . cyclobenzaprine (FLEXERIL) 10 MG tablet Take 10 mg by mouth 3 (three) times daily as needed for muscle spasms.      . furosemide (LASIX) 80 MG tablet Take 80 mg by mouth 2 (two) times daily.      [name] HYDROcodone-acetaminophen (NORCO) 10-325 MG per tablet Take 1 tablet by mouth every 4 (four) hours as needed for moderate pain.       [name] insulin glargine (LANTUS) 100 UNIT/ML injection Inject 30 Units into the skin at bedtime.      . lovastatin (MEVACOR) 40 MG tablet Take 40 mg by mouth at bedtime.      . tamsulosin (FLOMAX) 0.4 MG CAPS  capsule Take 0.4 mg by mouth daily after breakfast.        Inpatient Medications:  . [MAR HOLD] amLODipine  10 mg Oral Daily  . [facility] HOLD] aspirin EC  325 mg Oral Daily  . [facility] HOLD] calcitRIOL  0.25 mcg Oral Daily  . [facility] HOLD] cloNIDine  0.3 mg Oral TID  . [MAR HOLD] enoxaparin (LOVENOX) injection  30 mg Subcutaneous Q24H  . [facility] HOLD] furosemide  80 mg Oral BID  . [MAR HOLD] insulin aspart  2-6 Units Subcutaneous TID WC & HS  . [MAR HOLD] insulin glargine  30 Units Subcutaneous QHS  . [facility] HOLD] simvastatin  20 mg Oral q1800  . [facility] HOLD] tamsulosin  0.4 mg Oral QPC breakfast    Allergies:  Allergies  Allergen Reactions  . Lisinopril Other (See Comments)    Increased potassium level   . Meloxicam Nausea And Vomiting  . Victoza [Liraglutide] Diarrhea, Nausea And Vomiting and Swelling    History   Social History  . Marital Status: Married    Spouse Name: N/A     Number of Children: N/A  . Years of Education: N/A   Occupational History  . Not on file.   Social History Main Topics  . Smoking status: Former Smoker -- 0.50 packs/day for 40 years    Types: Cigarettes    Quit date: 12/27/2012  . Smokeless tobacco: Never Used  . Alcohol Use: No  . Drug Use: No  . Sexual Activity: Yes   Other Topics Concern  . Not on file   Social History Narrative  . No narrative on file     History reviewed. No pertinent family history.   Physical Exam: Filed Vitals:   03/19/13 1320 03/19/13 1330 03/19/13 1340 03/19/13 1350  BP: 138/70 140/81 140/67 136/78  Pulse: 62 62 59 72  Temp:      TempSrc:      Resp: 15 15 15 16   Height:      Weight:      SpO2: 99% 97% 99% 95%    GEN- The patient is well appearing, alert and oriented x 3 today.   Head- normocephalic, atraumatic Eyes-  Sclera clear, conjunctiva pink Ears- hearing intact Oropharynx- clear Neck- supple, no JVP Lymph- no cervical lymphadenopathy Lungs- Clear to ausculation bilaterally, normal work of breathing Heart- Regular rate and rhythm  GI- soft, NT, ND, + BS Extremities- no clubbing, cyanosis, or edema MS- no significant deformity or atrophy Skin- no rash or lesion Psych- euthymic mood, full affect  Labs:   Lab Results  Component Value Date   WBC 7.5 03/16/2013   HGB 9.0* 03/16/2013   HCT 26.8* 03/16/2013   MCV 87.6 03/16/2013   PLT 115* 03/16/2013    Recent Labs Lab 03/16/13 0302  NA 145  K 4.7  CL 110  CO2 20  BUN 49*  CREATININE 4.71*  CALCIUM 8.4  PROT 6.7  BILITOT 0.2*  ALKPHOS 62  ALT 11  AST 9  GLUCOSE 124*     Radiology/Studies: Dg Chest 2 View 03/16/2013   CLINICAL DATA:  Stroke.  EXAM: CHEST  2 VIEW  COMPARISON:  03/15/2013  FINDINGS: Heart size is slightly enlarged but unchanged. Negative for airspace disease or edema. Bony thorax is intact. No definite pleural effusions.  IMPRESSION: Stable mild cardiomegaly.  No acute chest findings.   Electronically Signed    By: Markus Daft M.D.   On: 03/16/2013 16:11   Mr Brain Wo Contrast 03/16/2013  CLINICAL DATA:  Aphasia.  24 hr status post t-PA.  EXAM: MRI HEAD WITHOUT CONTRAST  MRA HEAD WITHOUT CONTRAST  TECHNIQUE: Multiplanar, multiecho pulse sequences of the brain and surrounding structures were obtained without intravenous contrast. Angiographic images of the head were obtained using MRA technique without contrast.  COMPARISON:  Head CT 03/15/2013  FINDINGS: MRI HEAD FINDINGS  There is confluent restricted diffusion consistent with acute infarct involving the anterior, lateral left parietal lobe and parietal operculum. A few separate, punctate foci of acute infarct are also present in both the postcentral and precentral gyri on the left. A single, small focus of susceptibility artifact in the region of infarct may represent focal petechial hemorrhage. There is mild edema without mass effect. There is mild generalized cerebral atrophy. There is no midline shift or extra-axial fluid collection.  Orbits are unremarkable. There is mild bilateral maxillary sinus mucosal thickening. A small amount of fluid is present left maxillary sinus. Left sphenoid sinus mucous retention cysts are noted. Mastoid air cells are clear. Vertebrobasilar flow voids are small in caliber. Major intracranial vascular flow voids otherwise appear preserved.  MRA HEAD FINDINGS  The distal right vertebral artery is markedly small in caliber diffusely, likely congenital, and may end in PICA. Distal left vertebral artery is patent and dominant. Left PICA origin is patent. Bilateral AICA origins are patent, with the right being dominant. Basilar artery is diffusely small in caliber without focal stenosis. SCA origins are patent bilaterally. P1 segments are markedly hypoplastic with prominent posterior communicating arteries present bilaterally (fetal type circulation). PCAs are otherwise unremarkable.  Internal carotid arteries are patent from skullbase to  carotid termini. ACAs and MCAs are unremarkable. There is a small, patent anterior communicating artery. No intracranial aneurysm is identified.  IMPRESSION: 1. Acute left MCA infarct. 2. Variant intracranial arterial anatomy as above. No evidence of major intracranial arterial occlusion.   Electronically Signed   By: Logan Bores   On: 03/16/2013 16:17   EKG: pending  TELEMETRY: sinus rhythm with intermittent sinus tach, no afib  Assessment and Plan:  1. Cryptogenic stroke The patient presents with cryptogenic stroke.    I spoke at length with the patient about monitoring for afib with an implantable loop recorder.  Risks, benefits, and alteratives to implantable loop recorder were discussed with the patient today.   At this time, the patient is very clear in their decision to proceed with implantable loop recorder.   2. HTN Stable No change required today   Please call with questions.

## 2013-03-19 NOTE — Progress Notes (Signed)
Discharge instructions given. Pt verbalized understanding and all questions were answered.  

## 2013-03-19 NOTE — H&P (View-Only) (Signed)
Stroke Team Progress Note  HISTORY 63 y.o. male with a history of hypertension, hyperlipidemia who presents with sudden onset right-sided numbness and difficulty with word finding that started abruptly while eating dinner. He was evaluated at [facility] and the decision was made to give t-PA there. He was then transferred to [facility].   He states that he has never had anything like this before. Since arrival, he has had several episodes of right arm shaking. He denies being able to suppress these. S/p Keppra 1gm. TPA at 16:44.   SUBJECTIVE His wife is at the bedside.   OBJECTIVE Most recent Vital Signs: Filed Vitals:   03/17/13 2212 03/18/13 0154 03/18/13 0630 03/18/13 0930  BP: 172/74 134/56 151/57 185/86  Pulse: 60 63 64 62  Temp: 99.3 F (37.4 C) 98.6 F (37 C) 98.8 F (37.1 C) 98.3 F (36.8 C)  TempSrc: Oral Oral Oral Oral  Resp: 18 18 18 18   Height:      Weight:      SpO2: 100% 100% 100% 100%   CBG (last 3)   Recent Labs  03/17/13 1544 03/17/13 2214 03/18/13 0632  GLUCAP 164* 174* 85    IV Fluid Intake:      MEDICATIONS  . aspirin EC  325 mg Oral Daily  . calcitRIOL  0.25 mcg Oral Daily  . cloNIDine  0.1 mg Oral TID  . enoxaparin (LOVENOX) injection  30 mg Subcutaneous Q24H  . furosemide  80 mg Oral BID  . insulin aspart  2-6 Units Subcutaneous TID WC & HS  . insulin glargine  30 Units Subcutaneous QHS  . simvastatin  20 mg Oral q1800  . tamsulosin  0.4 mg Oral QPC breakfast   PRN:  acetaminophen, acetaminophen, HYDROcodone-acetaminophen  Diet:  Carb Control thin liquids Activity:  Up with assistance DVT Prophylaxis:  SCDs  CLINICALLY SIGNIFICANT STUDIES Basic Metabolic Panel:   Recent Labs Lab 03/16/13 0302  NA 145  K 4.7  CL 110  CO2 20  GLUCOSE 124*  BUN 49*  CREATININE 4.71*  CALCIUM 8.4   Liver Function Tests:   Recent Labs Lab 03/16/13 0302  AST 9  ALT 11  ALKPHOS 62  BILITOT 0.2*  PROT 6.7  ALBUMIN 3.3*   CBC:   Recent  Labs Lab 03/16/13 0302  WBC 7.5  NEUTROABS 4.9  HGB 9.0*  HCT 26.8*  MCV 87.6  PLT 115*   Coagulation: No results found for this basename: LABPROT, INR,  in the last 168 hours Cardiac Enzymes: No results found for this basename: CKTOTAL, CKMB, CKMBINDEX, TROPONINI,  in the last 168 hours Urinalysis: No results found for this basename: COLORURINE, APPERANCEUR, LABSPEC, PHURINE, GLUCOSEU, HGBUR, BILIRUBINUR, KETONESUR, PROTEINUR, UROBILINOGEN, NITRITE, LEUKOCYTESUR,  in the last 168 hours Lipid Panel    Component Value Date/Time   CHOL 116 03/16/2013 0302   TRIG 98 03/16/2013 0302   HDL 34* 03/16/2013 0302   CHOLHDL 3.4 03/16/2013 0302   VLDL 20 03/16/2013 0302   LDLCALC 62 03/16/2013 0302   HgbA1C  Lab Results  Component Value Date   HGBA1C 7.0* 03/16/2013    Urine Drug Screen:   No results found for this basename: labopia,  cocainscrnur,  labbenz,  amphetmu,  thcu,  labbarb    Alcohol Level: No results found for this basename: ETH,  in the last 168 hours  CT of the brain    MRI of the brain 03/16/2013    Acute left MCA infarct that is cortical in nature  MRA of the brain  03/16/2013   No evidence of  major intracranial arterial occlusion.  2D Echocardiogram  EF 55-60% with no source of embolus.   Carotid Doppler  No evidence of hemodynamically significant internal carotid artery stenosis. Vertebral artery flow is antegrade.   CXR  03/16/2013  Stable mild cardiomegaly.  No acute chest findings.   EKG sinus  EEG normal EEG recording during wakefulness and during sleep. No evidence of an epileptic disorder was demonstrated.   Therapy Recommendations OP PT  Physical Exam   General: In bed, NAD  CV: Regular rate and rhythm  Mental Status:  Patient is awake, alert, oriented to person, place, month, year, and situation.  Immediate and remote memory are intact.  Patient is able to give a clear and coherent history.  No signs of neglect. He has a mild expressive aphasia.  Cranial  Nerves:  II: Visual Fields are full. Pupils are equal, round, and reactive to light. Discs are difficult to visualize.  III,IV, VI: EOMI without ptosis or diploplia.  V: Facial sensation is diminished on right to temperature  VII: Facial movement is symmetric.  VIII: hearing is intact to voice  X: Uvula elevates symmetrically  XI: Shoulder shrug is symmetric.  XII: tongue is midline without atrophy or fasciculations.  Motor:  Tone is normal. Bulk is normal. 5/5 b/l decreased grip RUE Sensory:  Sensation is diminished throughout the right side  Deep Tendon Reflexes:  2+ and symmetric in the biceps and patellae.  Cerebellar:  FNF intact bilaterally, though slower on the right than left  Gait:  Not tested    ASSESSMENT [name] is a 63 y.o. male with a history of hypertension, hyperlipidemia who presents with right-sided weakness and expressive aphasia of sudden onset. S/p IV tPA at [facility] and shipped to [facility] for care. Imaging  confirms a new left cortical MCA ischemic stroke. Infarct if felt to be embolic secondary to unknown source. Patient was on aspirin 81 mg daily prior to admission. Now on aspirin 325 mg pt daily for secondary stroke prevention. Patient with resultant right hemiparesis, mild expressive aphasia.   hypertension  Hyperlipidemia, LDL 62, on mevacor PTA, now on zocor 20 mg, goal LDL < 70 for diabetics  Diabetes, HgbA1c 7.0, goal < 7.0  obstructive sleep apnea    New right arm shaking  EEG negative   One time dose keppra administered   Wife reports recent finding of heart murmur, wife reports a consult with Dr. Bridgett Larsson shceulded Fri  Kidney disease stage IV, plans underway to eval for kidney transplant   Chronic back pain  Hospital day # 3  TREATMENT/PLAN  Continue aspirin 325 mg by mouth daily for secondary stroke prevention  Resume home BP medications  TEE to look for embolic source. Arranged with [place]one for tomorrow.  If positive for PFO (patent foramen ovale), check bilateral lower extremity venous dopplers to rule out DVT as possible source of stroke. (I have made patient NPO after midnight tonight).  If TEE negative, a [place] electrophysiologist will consult and consider placement of an place implantable loop recorder to evaluate for atrial fibrillation as etiology of stroke. This has been explained to patient/family by Dr. Leonie Man and they are agreeable.  OP PT  Burnetta Sabin, MSN, RN, ANVP-BC, ANP-BC, GNP-BC [facility] Pager: [phone] 03/18/2013 11:19 AM  I have personally obtained a history, examined the patient, evaluated imaging results,  and formulated the assessment and plan of care. I agree with the above. Antony Contras, MD

## 2013-03-19 NOTE — Progress Notes (Signed)
Stroke Team Progress Note  HISTORY 63 y.o. male with a history of hypertension, hyperlipidemia who presents with sudden onset right-sided numbness and difficulty with word finding that started abruptly while eating dinner. He was evaluated at [facility] and the decision was made to give t-PA there. He was then transferred to [facility].   He states that he has never had anything like this before. Since arrival, he has had several episodes of right arm shaking. He denies being able to suppress these. S/p Keppra 1gm. TPA at 16:44.   SUBJECTIVE Patient currently in endoscopy, TEE underway.  OBJECTIVE Most recent Vital Signs: Filed Vitals:   03/19/13 1255 03/19/13 1300 03/19/13 1309 03/19/13 1311  BP: 151/78  121/94   Pulse: 61 68 61 63  Temp:      TempSrc:      Resp: 17 16 15 15   Height:      Weight:      SpO2: 99% 100%     CBG (last 3)   Recent Labs  03/18/13 2145 03/19/13 0643 03/19/13 1138  GLUCAP 145* 93 117*    IV Fluid Intake:   . sodium chloride 250 mL (03/19/13 1231)    MEDICATIONS  . [MAR HOLD] amLODipine  10 mg Oral Daily  . [facility] HOLD] aspirin EC  325 mg Oral Daily  . [facility] HOLD] calcitRIOL  0.25 mcg Oral Daily  . [facility] HOLD] cloNIDine  0.3 mg Oral TID  . [MAR HOLD] enoxaparin (LOVENOX) injection  30 mg Subcutaneous Q24H  . [facility] HOLD] furosemide  80 mg Oral BID  . [MAR HOLD] insulin aspart  2-6 Units Subcutaneous TID WC & HS  . [MAR HOLD] insulin glargine  30 Units Subcutaneous QHS  . [facility] HOLD] simvastatin  20 mg Oral q1800  . [facility] HOLD] tamsulosin  0.4 mg Oral QPC breakfast   PRN:  [MAR HOLD] acetaminophen, [MAR HOLD] acetaminophen, [MAR HOLD] HYDROcodone-acetaminophen  Diet:  NPO thin liquids Activity:  Up with assistance DVT Prophylaxis:  SCDs  CLINICALLY SIGNIFICANT STUDIES Basic Metabolic Panel:   Recent Labs Lab 03/16/13 0302  NA 145  K 4.7  CL 110  CO2 20  GLUCOSE 124*  BUN 49*  CREATININE 4.71*  CALCIUM 8.4   Liver Function Tests:    Recent Labs Lab 03/16/13 0302  AST 9  ALT 11  ALKPHOS 62  BILITOT 0.2*  PROT 6.7  ALBUMIN 3.3*   CBC:   Recent Labs Lab 03/16/13 0302  WBC 7.5  NEUTROABS 4.9  HGB 9.0*  HCT 26.8*  MCV 87.6  PLT 115*   Coagulation: No results found for this basename: LABPROT, INR,  in the last 168 hours Cardiac Enzymes: No results found for this basename: CKTOTAL, CKMB, CKMBINDEX, TROPONINI,  in the last 168 hours Urinalysis: No results found for this basename: COLORURINE, APPERANCEUR, LABSPEC, PHURINE, GLUCOSEU, HGBUR, BILIRUBINUR, KETONESUR, PROTEINUR, UROBILINOGEN, NITRITE, LEUKOCYTESUR,  in the last 168 hours Lipid Panel    Component Value Date/Time   CHOL 116 03/16/2013 0302   TRIG 98 03/16/2013 0302   HDL 34* 03/16/2013 0302   CHOLHDL 3.4 03/16/2013 0302   VLDL 20 03/16/2013 0302   LDLCALC 62 03/16/2013 0302   HgbA1C  Lab Results  Component Value Date   HGBA1C 7.0* 03/16/2013    Urine Drug Screen:   No results found for this basename: labopia,  cocainscrnur,  labbenz,  amphetmu,  thcu,  labbarb    Alcohol Level: No results found for this basename: ETH,  in the last  168 hours  CT of the brain    MRI of the brain 03/16/2013    Acute left MCA infarct that is cortical in nature  MRA of the brain  03/16/2013   No evidence of  major intracranial arterial occlusion.  2D Echocardiogram  EF 55-60% with no source of embolus.   Carotid Doppler  No evidence of hemodynamically significant internal carotid artery stenosis. Vertebral artery flow is antegrade.   TEE 03/19/2013  CXR  03/16/2013  Stable mild cardiomegaly.  No acute chest findings.   EKG sinus  EEG normal EEG recording during wakefulness and during sleep. No evidence of an epileptic disorder was demonstrated.   Therapy Recommendations OP PT  Physical Exam   General: In bed, NAD  CV: Regular rate and rhythm  Mental Status:  Patient is sedated for TEE    He has a mild expressive aphasia.  Cranial Nerves:  II: Visual Fields  are full. Pupils are equal, round, and reactive to light. Discs are difficult to visualize.  III,IV, VI: EOMI without ptosis or diploplia.  V: Facial sensation is diminished on right to temperature  VII: Facial movement is symmetric.  VIII: hearing is intact to voice  X: Uvula elevates symmetrically  XI: Shoulder shrug is symmetric.  XII: tongue is midline without atrophy or fasciculations.  Motor:  Tone is normal. Bulk is normal. 5/5 b/l decreased grip RUE Sensory:  Sensation is diminished throughout the right side  Deep Tendon Reflexes:  2+ and symmetric in the biceps and patellae.  Cerebellar:  FNF intact bilaterally, though slower on the right than left  Gait:  Not tested    ASSESSMENT Jon [name] is a 63 y.o. male with a history of hypertension, hyperlipidemia who presents with right-sided weakness and expressive aphasia of sudden onset. S/p IV tPA at [facility] and shipped to [facility] for care. Imaging  confirms a new left cortical MCA ischemic stroke. Infarct if felt to be embolic secondary to unknown source. Patient was on aspirin 81 mg daily prior to admission. Now on aspirin 325 mg pt daily for secondary stroke prevention. Patient with resultant right hemiparesis, mild expressive aphasia.   hypertension  Hyperlipidemia, LDL 62, on mevacor PTA, now on zocor 20 mg, goal LDL < 70 for diabetics  Diabetes, HgbA1c 7.0, goal < 7.0  obstructive sleep apnea    New right arm shaking  EEG negative   One time dose keppra administered   Wife reports recent finding of heart murmur, wife reports a consult with Dr. Bridgett Larsson shceulded Fri  Kidney disease stage IV, plans underway to eval for kidney transplant   Chronic back pain  Hospital day # 4  TREATMENT/PLAN  Continue aspirin 325 mg by mouth daily for secondary stroke prevention  F/u TEE results  If TEE negative, a [place] electrophysiologist will consult and consider placement of an  place implantable loop recorder to evaluate for atrial fibrillation as etiology of stroke. This has been explained to patient/family by Dr. Leonie Man and they are agreeable.  OP PT  Burnetta Sabin, MSN, RN, ANVP-BC, ANP-BC, GNP-BC [facility] Pager: [phone] 03/19/2013 1:51 PM  I have personally obtained a history, examined the patient, evaluated imaging results, and formulated the assessment and plan of care. I agree with the above.  Antony Contras, MD

## 2013-03-20 ENCOUNTER — Encounter (HOSPITAL_COMMUNITY): Payer: Self-pay | Admitting: Cardiology

## 2013-03-21 ENCOUNTER — Encounter: Payer: Self-pay | Admitting: Vascular Surgery

## 2013-03-22 ENCOUNTER — Ambulatory Visit (INDEPENDENT_AMBULATORY_CARE_PROVIDER_SITE_OTHER): Payer: Medicare Other | Admitting: Vascular Surgery

## 2013-03-22 ENCOUNTER — Ambulatory Visit (INDEPENDENT_AMBULATORY_CARE_PROVIDER_SITE_OTHER)
Admission: RE | Admit: 2013-03-22 | Discharge: 2013-03-22 | Disposition: A | Discharge status: Home / Self Care | Payer: Medicare Other | Source: Ambulatory Visit | Attending: Vascular Surgery | Admitting: Vascular Surgery

## 2013-03-22 ENCOUNTER — Encounter: Payer: Self-pay | Admitting: Vascular Surgery

## 2013-03-22 ENCOUNTER — Ambulatory Visit (HOSPITAL_COMMUNITY)
Admission: RE | Admit: 2013-03-22 | Discharge: 2013-03-22 | Disposition: A | Discharge status: Home / Self Care | Payer: Medicare Other | Source: Ambulatory Visit | Attending: Vascular Surgery | Admitting: Vascular Surgery

## 2013-03-22 ENCOUNTER — Encounter (HOSPITAL_COMMUNITY): Payer: Self-pay | Admitting: *Deleted

## 2013-03-22 VITALS — BP 121/61 | HR 51 | Ht 71.0 in | Wt 235.6 lb

## 2013-03-22 DIAGNOSIS — Z01810 Encounter for preprocedural cardiovascular examination: Secondary | ICD-10-CM

## 2013-03-22 DIAGNOSIS — Z5181 Encounter for therapeutic drug level monitoring: Secondary | ICD-10-CM | POA: Insufficient documentation

## 2013-03-22 DIAGNOSIS — N184 Chronic kidney disease, stage 4 (severe): Secondary | ICD-10-CM

## 2013-03-22 HISTORY — DX: Encounter for preprocedural cardiovascular examination: Z01.810

## 2013-03-22 NOTE — Progress Notes (Signed)
Referred by:  [name], MD [place], [place] [id number]  Reason for referral: New access  History of Present Illness  Jon [name] is a 63 y.o. (1950-03-30) male who presents for evaluation for permanent access.  The patient is right hand dominant.  The patient has not had previous access procedures.  Previous central venous cannulation procedures include: none.  The patient has never had a PPM placed.  The patient has a living related kidney offer and he is in the process of pursuing such.  He was told he has at least 6 months before needing HD.  Past Medical History  Diagnosis Date  . Hypertension     takes Amlodipine and Catapres daily  . Hyperlipidemia     takes Lovastatin daily  . Sleep apnea   . Arthritis   . Chronic back pain     radiculopathy and stenosis  . History of colon polyps   . H/O hiatal hernia   . Urinary frequency   . Peripheral edema     takes Lasix daily  . Nocturia   . Diabetes mellitus     Levemir nightly ;type 2  . CVA (cerebral infarction) 03/2013  . Chronic kidney disease   . Stroke     Past Surgical History  Procedure Laterality Date  . Right leg surgery      pin in place  . Neuroplasty / transposition median nerve at carpal tunnel bilateral    . Back surgery    . Right wrist surgery    . Left knee surgery    . Hemorrhoid surgery    . Colonoscopy    . Esophagogastroduodenoscopy    . Loop recorder implant  03/19/13    MDT LinQ implanted by Dr Rayann Heman for cryptogenic stroke  . Tee without cardioversion N/A 03/19/2013    Procedure: TRANSESOPHAGEAL ECHOCARDIOGRAM (TEE);  Surgeon: [name], MD;  Location: [facility] ENDOSCOPY;  Service: Cardiovascular;  Laterality: N/A;    History   Social History  . Marital Status: Married    Spouse Name: N/A    Number of Children: N/A  . Years of Education: N/A   Occupational History  . Not on file.   Social History Main Topics  . Smoking status: Former Smoker -- 0.50  packs/day for 40 years    Types: Cigarettes    Quit date: 12/27/2012  . Smokeless tobacco: Never Used  . Alcohol Use: No  . Drug Use: No  . Sexual Activity: Yes   Other Topics Concern  . Not on file   Social History Narrative  . No narrative on file    Family History  Problem Relation Age of Onset  . Hypertension Mother   . Hyperlipidemia Father   . Hypertension Father   . Heart disease Father     before age 96  . Diabetes Brother   . Hyperlipidemia Son    Current Outpatient Prescriptions on File Prior to Visit  Medication Sig Dispense Refill  . amLODipine (NORVASC) 10 MG tablet Take 10 mg by mouth daily.      [name] aspirin EC 325 MG EC tablet Take 1 tablet (325 mg total) by mouth daily.  30 tablet  0  . calcitRIOL (ROCALTROL) 0.25 MCG capsule Take 0.25 mcg by mouth daily.      . Calcium Citrate-Vitamin D (CALCIUM CITRATE +D PO) Take 1 tablet by mouth 2 (two) times daily.      . cloNIDine (CATAPRES) 0.3  MG tablet Take 0.3 mg by mouth 3 (three) times daily.      . cyclobenzaprine (FLEXERIL) 10 MG tablet Take 10 mg by mouth 3 (three) times daily as needed for muscle spasms.      . furosemide (LASIX) 80 MG tablet Take 80 mg by mouth 2 (two) times daily.      [name] HYDROcodone-acetaminophen (NORCO) 10-325 MG per tablet Take 1 tablet by mouth every 4 (four) hours as needed for moderate pain.       [name] insulin glargine (LANTUS) 100 UNIT/ML injection Inject 30 Units into the skin at bedtime.      . lovastatin (MEVACOR) 40 MG tablet Take 40 mg by mouth at bedtime.      . tamsulosin (FLOMAX) 0.4 MG CAPS capsule Take 0.4 mg by mouth daily after breakfast.       No current facility-administered medications on file prior to visit.    Allergies  Allergen Reactions  . Lisinopril Other (See Comments)    Increased potassium level   . Meloxicam Nausea And Vomiting  . Victoza [Liraglutide] Diarrhea, Nausea And Vomiting and Swelling    REVIEW OF SYSTEMS:  (Positives checked otherwise  negative)  CARDIOVASCULAR:  []  chest pain, []  chest pressure, []  palpitations, []  shortness of breath when laying flat, []  shortness of breath with exertion,  []  pain in feet when walking, []  pain in feet when laying flat, []  history of blood clot in veins (DVT), []  history of phlebitis, [x]  swelling in legs, []  varicose veins  PULMONARY:  []  productive cough, []  asthma, []  wheezing  NEUROLOGIC:  []  weakness in arms or legs, []  numbness in arms or legs, []  difficulty speaking or slurred speech, []  temporary loss of vision in one eye, []  dizziness  HEMATOLOGIC:  []  bleeding problems, []  problems with blood clotting too easily  MUSCULOSKEL:  []  joint pain, []  joint swelling  GASTROINTEST:  []  vomiting blood, []  blood in stool     GENITOURINARY:  []  burning with urination, []  blood in urine  PSYCHIATRIC:  []  history of major depression  INTEGUMENTARY:  []  rashes, []  ulcers  CONSTITUTIONAL:  []  fever, []  chills  Physical Examination  Filed Vitals:   03/22/13 1446  BP: 121/61  Pulse: 51  Height: 5\' 11"  (1.803 m)  Weight: 235 lb 9.6 oz (106.867 kg)  SpO2: 100%   Body mass index is 32.87 kg/(m^2).  General: A&O x 3, WD, mildly obese  Head: [place]/AT  Ear/Nose/Throat: Hearing grossly intact, nares w/o erythema or drainage, oropharynx w/o Erythema/Exudate, Mallampati score: 3  Eyes: PERRLA, EOMI  Neck: Supple, no nuchal rigidity, no palpable LAD  Pulmonary: Sym exp, good air movt, CTAB, no rales, rhonchi, & wheezing  Cardiac: RRR, Nl S1, S2, no Murmurs, rubs or gallops  Vascular: Vessel Right Left  Radial Faintly Palpable Faintly Palpable  Ulnar Not Palpable Not Palpable  Brachial Faintly Palpable Palpable  Carotid  Palpable, without bruit Palpable, without bruit  Aorta Not palpable N/A  Femoral Palpable Palpable  Popliteal Not palpable Not palpable  PT Not Palpable Not Palpable  DP Not Palpable Not Palpable   Gastrointestinal: soft, NTND, -G/R, - HSM, - masses, - CVAT  B  Musculoskeletal: M/S 5/5 throughout , Extremities without ischemic changes   Neurologic: CN 2-12 intact , Pain and light touch intact in extremities , Motor exam as listed above  Psychiatric: Judgment intact, Mood & affect appropriate for pt's clinical situation  Dermatologic: See M/S exam for extremity exam, no rashes otherwise  noted  Lymph : No Cervical, Axillary, or Inguinal lymphadenopathy   Non-Invasive Vascular Imaging  Vein Mapping  (Date: 03/22/2013):   R arm: acceptable vein conduits include upper arm cephalic and basilic  L arm: acceptable vein conduits include possible forearm cephalic, definitely upper arm cephalic, short basilic  Outside Studies/Documentation 20 pages of outside documents were reviewed including: outpatient nephrology chart.  Medical Decision Making  CESAR TOVES is a 64 y.o. male who presents with chronic kidney disease stage IV, likely PAD   This patient's arm and leg pulses are not particularly easy to palpate so I suspect some degree of global atherosclerosis.  Based on vein mapping and examination, this patient's permanent access options include: R BC AVF, R stage BVT, L RC vs BC AVF.  I would start with a L arm fistula placement.  I had an extensive discussion with this patient in regards to the nature of access surgery, including risk, benefits, and alternatives.    The patient is aware that the risks of access surgery include but are not limited to: bleeding, infection, steal syndrome, nerve damage, ischemic monomelic neuropathy, failure of access to mature, and possible need for additional access procedures in the future.  The patient want to discuss things with Dr. [name].  He would like to go straight to the kidney transplant.  Adele Barthel, MD [facility] Office: [phone] Pager: [phone]  03/22/2013, 5:22 PM

## 2013-03-27 ENCOUNTER — Other Ambulatory Visit: Payer: Self-pay

## 2013-03-28 ENCOUNTER — Ambulatory Visit (INDEPENDENT_AMBULATORY_CARE_PROVIDER_SITE_OTHER): Payer: Medicare Other | Admitting: *Deleted

## 2013-03-28 DIAGNOSIS — I639 Cerebral infarction, unspecified: Secondary | ICD-10-CM

## 2013-03-28 DIAGNOSIS — I6350 Cerebral infarction due to unspecified occlusion or stenosis of unspecified cerebral artery: Secondary | ICD-10-CM

## 2013-03-28 LAB — MDC_IDC_ENUM_SESS_TYPE_INCLINIC

## 2013-03-28 NOTE — Progress Notes (Signed)
Wound check ILR.  Wound well healed, no redness or edema noted.  We will follow via [facility].

## 2013-04-02 ENCOUNTER — Encounter (HOSPITAL_COMMUNITY): Payer: Self-pay

## 2013-04-02 ENCOUNTER — Encounter (HOSPITAL_COMMUNITY)
Admission: RE | Admit: 2013-04-02 | Discharge: 2013-04-02 | Disposition: A | Discharge status: Home / Self Care | Payer: Medicare Other | Source: Ambulatory Visit | Attending: Vascular Surgery | Admitting: Vascular Surgery

## 2013-04-02 LAB — POCT I-STAT 4, (NA,K, GLUC, HGB,HCT)
Glucose, Bld: 194 mg/dL — ABNORMAL HIGH (ref 70–99)
HEMATOCRIT: 33 % — AB (ref 39.0–52.0)
HEMOGLOBIN: 11.2 g/dL — AB (ref 13.0–17.0)
Potassium: 4.4 mEq/L (ref 3.7–5.3)
Sodium: 143 mEq/L (ref 137–147)

## 2013-04-02 MED ORDER — CHLORHEXIDINE GLUCONATE 4 % EX LIQD: 60.0000 mL | Freq: Once | CUTANEOUS | Status: DC

## 2013-04-02 MED ORDER — SODIUM CHLORIDE 0.9 % IV SOLN: INTRAVENOUS | Status: DC

## 2013-04-02 MED ORDER — DEXTROSE 5 % IV SOLN
1.5000 g | INTRAVENOUS | Status: AC
  Administered 2013-04-03: 1.5 g via INTRAVENOUS
  Filled 2013-04-02: qty 1.5

## 2013-04-02 NOTE — Pre-Procedure Instructions (Signed)
Jon [name]  04/02/2013   Your procedure is scheduled on:  04/03/13  Report to [place]  2 * 3 at 830 AM.  Call this number if you have problems the morning of surgery: [phone]   Remember:   Do not eat food or drink liquids after midnight.   Take these medicines the morning of surgery with A SIP OF WATER: norvasc,clondine,hydrocodone,flomax   Do not wear jewelry, make-up or nail polish.  Do not wear lotions, powders, or perfumes. You may wear deodorant.  Do not shave 48 hours prior to surgery. Men may shave face and neck.  Do not bring valuables to the hospital.  [facility] is not responsible                  for any belongings or valuables.               Contacts, dentures or bridgework may not be worn into surgery.  Leave suitcase in the car. After surgery it may be brought to your room.  For patients admitted to the hospital, discharge time is determined by your                treatment team.               Patients discharged the day of surgery will not be allowed to drive  home.  Name and phone number of your driver: family  Special Instructions: N/A   Please read over the following fact sheets that you were given: Pain Booklet, Coughing and Deep Breathing and Surgical Site Infection Prevention

## 2013-04-02 NOTE — Progress Notes (Signed)
Anesthesia Chart Review: Patient is a 63 year old male scheduled for LUE AVF tomorrow by Dr. Bridgett Larsson. He is not yet on hemodialysis. I was given chart to review after 4 PM, as his surgery date just got moved up this afternoon.  History includes smoking, CKD, HTN, HLD, OSA with CPAP use, hiatal hernia, DM2, peripheral edema, bronchitis, PNA, arthritis, obesity, L3-4 PLIF on 12/27/12. He was admitted 03/15/13 - 03/19/13 for acute left cortical MCA infarct, embolic secondary to unknown source, s/p IV TPA. A loop recorder was placed by Dr. Rayann Heman on 03/19/13 to look for atrial fibrillation as a possible source.  He was discharged home on ASA for CVA prevention. Neurologist is Dr. Leonie Man. Nephrologist is Dr. Fleet Contras at [facility]). PCP is Dr. Lovette Cliche. Patient is scheduled to see cardiologist Dr. Kirk Ruths on 04/29/13 for clearance for consideration for renal transplant.  EKG on 12/20/12 showed NSR, left atrial abnormality. EKG at [facility] on 03/15/13 showed NSR.   TEE on 03/19/13 showed: - Left ventricle: Systolic function was normal. The estimated ejection fraction was in the range of 55% to 60%. Wall motion was normal; there were no regional wall motion abnormalities. - Aortic valve: No evidence of vegetation. - Mitral valve: No evidence of vegetation. - Left atrium: No evidence of thrombus in the atrial cavity or appendage. - Atrial septum: No defect or patent foramen ovale was identified. Echo contrast study showed no right-to-left atrial level shunt, following an increase in RA pressure induced by provocative maneuvers. - Tricuspid valve: No evidence of vegetation. - Pulmonic valve: No evidence of vegetation.  CXR on 03/16/13 showed stable mild cardiomegaly, no acute chest findings.  ISTAT results from today noted.    Patient tolerated PLIF in 12/2012, but had an embolic CVA earlier this month treated with TPA.  He remains on ASA.  He is scheduled to  see cardiology, but it appears to be more routine for evaluation prior to consideration for renal transplant in the future.  He does have a loop recorder.  I don't see any recent interrogation input, but prior EKGs showed NSR.  Reviewed above with anesthesiologist Dr. Glennon Mac.  If no acute changes then it is anticipated that he can proceed with this procedure. Case is posted for MAC.  Further evaluation by his assigned anesthesiologist on the day of surgery.  George Hugh Brand [facility] Stay [facility]/Anesthesiology Phone [phone] 04/02/2013 4:52 PM

## 2013-04-03 ENCOUNTER — Other Ambulatory Visit: Payer: Self-pay | Admitting: *Deleted

## 2013-04-03 ENCOUNTER — Encounter (HOSPITAL_COMMUNITY): Payer: Medicare Other | Admitting: Vascular Surgery

## 2013-04-03 ENCOUNTER — Encounter (HOSPITAL_COMMUNITY)
Admission: RE | Disposition: A | Discharge status: Home / Self Care | Payer: Self-pay | Source: Ambulatory Visit | Attending: Vascular Surgery

## 2013-04-03 ENCOUNTER — Ambulatory Visit (HOSPITAL_COMMUNITY)
Admission: RE | Admit: 2013-04-03 | Discharge: 2013-04-03 | Disposition: A | Discharge status: Home / Self Care | Payer: Medicare Other | Source: Ambulatory Visit | Attending: Vascular Surgery | Admitting: Vascular Surgery

## 2013-04-03 ENCOUNTER — Telehealth: Payer: Self-pay | Admitting: Vascular Surgery

## 2013-04-03 ENCOUNTER — Encounter (HOSPITAL_COMMUNITY): Payer: Self-pay | Admitting: Surgery

## 2013-04-03 ENCOUNTER — Ambulatory Visit (HOSPITAL_COMMUNITY): Payer: Medicare Other | Admitting: Anesthesiology

## 2013-04-03 DIAGNOSIS — N184 Chronic kidney disease, stage 4 (severe): Secondary | ICD-10-CM

## 2013-04-03 DIAGNOSIS — Z7982 Long term (current) use of aspirin: Secondary | ICD-10-CM | POA: Insufficient documentation

## 2013-04-03 DIAGNOSIS — Z860100 Personal history of colon polyps, unspecified: Secondary | ICD-10-CM | POA: Insufficient documentation

## 2013-04-03 DIAGNOSIS — M549 Dorsalgia, unspecified: Secondary | ICD-10-CM | POA: Insufficient documentation

## 2013-04-03 DIAGNOSIS — N185 Chronic kidney disease, stage 5: Secondary | ICD-10-CM | POA: Insufficient documentation

## 2013-04-03 DIAGNOSIS — Z8673 Personal history of transient ischemic attack (TIA), and cerebral infarction without residual deficits: Secondary | ICD-10-CM | POA: Insufficient documentation

## 2013-04-03 DIAGNOSIS — M129 Arthropathy, unspecified: Secondary | ICD-10-CM | POA: Insufficient documentation

## 2013-04-03 DIAGNOSIS — R609 Edema, unspecified: Secondary | ICD-10-CM | POA: Insufficient documentation

## 2013-04-03 DIAGNOSIS — R351 Nocturia: Secondary | ICD-10-CM | POA: Insufficient documentation

## 2013-04-03 DIAGNOSIS — N186 End stage renal disease: Secondary | ICD-10-CM

## 2013-04-03 DIAGNOSIS — E119 Type 2 diabetes mellitus without complications: Secondary | ICD-10-CM | POA: Insufficient documentation

## 2013-04-03 DIAGNOSIS — Z01812 Encounter for preprocedural laboratory examination: Secondary | ICD-10-CM | POA: Insufficient documentation

## 2013-04-03 DIAGNOSIS — G4730 Sleep apnea, unspecified: Secondary | ICD-10-CM | POA: Insufficient documentation

## 2013-04-03 DIAGNOSIS — Z4931 Encounter for adequacy testing for hemodialysis: Secondary | ICD-10-CM

## 2013-04-03 DIAGNOSIS — Z87891 Personal history of nicotine dependence: Secondary | ICD-10-CM | POA: Insufficient documentation

## 2013-04-03 DIAGNOSIS — I120 Hypertensive chronic kidney disease with stage 5 chronic kidney disease or end stage renal disease: Secondary | ICD-10-CM | POA: Insufficient documentation

## 2013-04-03 DIAGNOSIS — E785 Hyperlipidemia, unspecified: Secondary | ICD-10-CM | POA: Insufficient documentation

## 2013-04-03 DIAGNOSIS — G8929 Other chronic pain: Secondary | ICD-10-CM | POA: Insufficient documentation

## 2013-04-03 DIAGNOSIS — Z794 Long term (current) use of insulin: Secondary | ICD-10-CM | POA: Insufficient documentation

## 2013-04-03 DIAGNOSIS — R350 Frequency of micturition: Secondary | ICD-10-CM | POA: Insufficient documentation

## 2013-04-03 HISTORY — PX: AV FISTULA PLACEMENT: SHX1204

## 2013-04-03 LAB — GLUCOSE, CAPILLARY
Glucose-Capillary: 94 mg/dL (ref 70–99)
Glucose-Capillary: 93 mg/dL (ref 70–99)

## 2013-04-03 SURGERY — ARTERIOVENOUS (AV) FISTULA CREATION
Anesthesia: General | Site: Arm Lower | Laterality: Left

## 2013-04-03 MED ORDER — FENTANYL CITRATE 0.05 MG/ML IJ SOLN
INTRAMUSCULAR | Status: DC | PRN
  Administered 2013-04-03 (×2): 25 ug via INTRAVENOUS
  Administered 2013-04-03: 50 ug via INTRAVENOUS

## 2013-04-03 MED ORDER — ONDANSETRON HCL 4 MG/2ML IJ SOLN
INTRAMUSCULAR | Status: DC | PRN
  Administered 2013-04-03: 4 mg via INTRAVENOUS

## 2013-04-03 MED ORDER — ONDANSETRON HCL 4 MG/2ML IJ SOLN: 4.0000 mg | Freq: Four times a day (QID) | INTRAMUSCULAR | Status: DC | PRN

## 2013-04-03 MED ORDER — ONDANSETRON HCL 4 MG/2ML IJ SOLN
INTRAMUSCULAR | Status: AC
  Filled 2013-04-03: qty 2

## 2013-04-03 MED ORDER — FENTANYL CITRATE 0.05 MG/ML IJ SOLN
INTRAMUSCULAR | Status: AC
Start: 2013-04-03 — End: 2013-04-03
  Filled 2013-04-03: qty 5

## 2013-04-03 MED ORDER — SODIUM CHLORIDE 0.9 % IR SOLN
Status: DC | PRN
  Administered 2013-04-03: 13:00:00

## 2013-04-03 MED ORDER — ALBUMIN HUMAN 5 % IV SOLN
INTRAVENOUS | Status: DC | PRN
  Administered 2013-04-03: 13:00:00 via INTRAVENOUS

## 2013-04-03 MED ORDER — PROPOFOL 10 MG/ML IV BOLUS
INTRAVENOUS | Status: AC
  Filled 2013-04-03: qty 20

## 2013-04-03 MED ORDER — HYDROCODONE-ACETAMINOPHEN 10-325 MG PO TABS: 1.0000 | ORAL_TABLET | ORAL | Status: DC | PRN

## 2013-04-03 MED ORDER — MIDAZOLAM HCL 5 MG/5ML IJ SOLN
INTRAMUSCULAR | Status: DC | PRN
  Administered 2013-04-03: 2 mg via INTRAVENOUS

## 2013-04-03 MED ORDER — PROPOFOL 10 MG/ML IV BOLUS
INTRAVENOUS | Status: DC | PRN
  Administered 2013-04-03: 180 mg via INTRAVENOUS

## 2013-04-03 MED ORDER — THROMBIN 20000 UNITS EX SOLR
CUTANEOUS | Status: AC
  Filled 2013-04-03: qty 20000

## 2013-04-03 MED ORDER — SODIUM CHLORIDE 0.9 % IV SOLN
INTRAVENOUS | Status: DC
  Administered 2013-04-03: 10:00:00 via INTRAVENOUS

## 2013-04-03 MED ORDER — MIDAZOLAM HCL 2 MG/2ML IJ SOLN
INTRAMUSCULAR | Status: AC
  Filled 2013-04-03: qty 2

## 2013-04-03 MED ORDER — PHENYLEPHRINE HCL 10 MG/ML IJ SOLN
INTRAMUSCULAR | Status: DC | PRN
  Administered 2013-04-03 (×2): 40 ug via INTRAVENOUS

## 2013-04-03 MED ORDER — OXYCODONE HCL 5 MG/5ML PO SOLN: 5.0000 mg | Freq: Once | ORAL | Status: DC | PRN

## 2013-04-03 MED ORDER — 0.9 % SODIUM CHLORIDE (POUR BTL) OPTIME
TOPICAL | Status: DC | PRN
  Administered 2013-04-03: 1000 mL

## 2013-04-03 MED ORDER — OXYCODONE HCL 5 MG PO TABS: 5.0000 mg | ORAL_TABLET | Freq: Once | ORAL | Status: DC | PRN

## 2013-04-03 MED ORDER — LIDOCAINE HCL (CARDIAC) 20 MG/ML IV SOLN
INTRAVENOUS | Status: DC | PRN
  Administered 2013-04-03: 80 mg via INTRAVENOUS

## 2013-04-03 MED ORDER — GLYCOPYRROLATE 0.2 MG/ML IJ SOLN
INTRAMUSCULAR | Status: DC | PRN
  Administered 2013-04-03: 0.2 mg via INTRAVENOUS

## 2013-04-03 MED ORDER — THROMBIN 20000 UNITS EX SOLR
CUTANEOUS | Status: DC | PRN
  Administered 2013-04-03: 14:00:00 via TOPICAL

## 2013-04-03 MED ORDER — FENTANYL CITRATE 0.05 MG/ML IJ SOLN: 25.0000 ug | INTRAMUSCULAR | Status: DC | PRN

## 2013-04-03 SURGICAL SUPPLY — 34 items
ADH SKN CLS APL DERMABOND .7 (GAUZE/BANDAGES/DRESSINGS) ×1
ARMBAND PINK RESTRICT EXTREMIT (MISCELLANEOUS) ×2 IMPLANT
CANISTER SUCTION 2500CC (MISCELLANEOUS) ×2 IMPLANT
CLIP TI MEDIUM 6 (CLIP) ×2 IMPLANT
CLIP TI WIDE RED SMALL 6 (CLIP) ×2 IMPLANT
COVER PROBE W GEL 5X96 (DRAPES) ×1 IMPLANT
COVER SURGICAL LIGHT HANDLE (MISCELLANEOUS) ×2 IMPLANT
DECANTER SPIKE VIAL GLASS SM (MISCELLANEOUS) ×2 IMPLANT
DERMABOND ADVANCED (GAUZE/BANDAGES/DRESSINGS) ×1
DERMABOND ADVANCED .7 DNX12 (GAUZE/BANDAGES/DRESSINGS) ×1 IMPLANT
ELECT REM PT RETURN 9FT ADLT (ELECTROSURGICAL) ×2
ELECTRODE REM PT RTRN 9FT ADLT (ELECTROSURGICAL) ×1 IMPLANT
GLOVE BIO SURGEON STRL SZ7 (GLOVE) ×2 IMPLANT
GLOVE BIOGEL PI IND STRL 7.5 (GLOVE) ×1 IMPLANT
GLOVE BIOGEL PI INDICATOR 7.5 (GLOVE) ×2
GLOVE SS BIOGEL STRL SZ 7.5 (GLOVE) IMPLANT
GLOVE SUPERSENSE BIOGEL SZ 7.5 (GLOVE) ×1
GOWN STRL REUS W/ TWL LRG LVL3 (GOWN DISPOSABLE) ×3 IMPLANT
GOWN STRL REUS W/TWL LRG LVL3 (GOWN DISPOSABLE) ×6
KIT BASIN OR (CUSTOM PROCEDURE TRAY) ×2 IMPLANT
KIT ROOM TURNOVER OR (KITS) ×2 IMPLANT
NS IRRIG 1000ML POUR BTL (IV SOLUTION) ×2 IMPLANT
PACK CV ACCESS (CUSTOM PROCEDURE TRAY) ×2 IMPLANT
PAD ARMBOARD 7.5X6 YLW CONV (MISCELLANEOUS) ×4 IMPLANT
SPONGE SURGIFOAM ABS GEL 100 (HEMOSTASIS) IMPLANT
SUT MNCRL AB 4-0 PS2 18 (SUTURE) ×3 IMPLANT
SUT PROLENE 6 0 BV (SUTURE) IMPLANT
SUT PROLENE 7 0 BV 1 (SUTURE) ×3 IMPLANT
SUT VIC AB 3-0 SH 27 (SUTURE) ×4
SUT VIC AB 3-0 SH 27X BRD (SUTURE) ×1 IMPLANT
TOWEL OR 17X24 6PK STRL BLUE (TOWEL DISPOSABLE) ×2 IMPLANT
TOWEL OR 17X26 10 PK STRL BLUE (TOWEL DISPOSABLE) ×2 IMPLANT
UNDERPAD 30X30 INCONTINENT (UNDERPADS AND DIAPERS) ×2 IMPLANT
WATER STERILE IRR 1000ML POUR (IV SOLUTION) ×2 IMPLANT

## 2013-04-03 NOTE — Discharge Instructions (Signed)
Dialysis °Dialysis is a procedure that replaces some of the work healthy kidneys do. It is done when you lose about 85 90% of your kidney function. It may also be done earlier if your symptoms may be improved by dialysis. During dialysis, wastes, salt, and extra water are removed from the blood, and the levels of certain chemicals in the blood (such as potassium) are maintained. Dialysis is done in sessions. Dialysis sessions are continued until the kidneys get better. If the kidneys cannot get better, such as in end-stage kidney disease, dialysis is continued for life or until you receive a new kidney (kidney transplant). There are two types of dialysis: hemodialysis and peritoneal dialysis. °HEMODIALYSIS  °Hemodialysis is a type of dialysis in which a machine called a dialyzer is used to filter the blood. Before beginning hemodialysis, you will have surgery to create a site where blood can be removed from the body and returned to the body (vascular access). There are three types of vascular accesses: °· Arteriovenous fistula. To create this type of access, an artery is connected to a vein (usually in the arm). A fistula takes 1 6 months to develop after surgery. If it develops properly, it usually lasts longer than the other types of vascular accesses. It is also less likely to become infected and cause blood clots. °· Arteriovenous graft. To create this type of access, an artery and a vein in the arm are connected with a tube. A graft may be used within 2 3 weeks of surgery. °· A venous catheter. To create this type of access, a thin, flexible tube (catheter) is placed in a large vein in your neck, chest, or groin. A catheter may be used right away. It is usually used as a temporary access when dialysis needs to begin immediately. °During hemodialysis, blood leaves the body through your access. It travels through a tube to the dialyzer, where it is filtered. The blood then returns to your body through another  tube. °Hemodialysis is usually performed by a caregiver at a hospital or dialysis center three times a week. Visits last about 3 4 hours. It may also be performed with the help of another person at home with training.  °PERITONEAL DIALYSIS °Peritoneal dialysis is a type of dialysis in which the thin lining of the abdomen (peritoneum) is used as a filter. Before beginning peritoneal dialysis, you will have surgery to place a catheter in your abdomen. The catheter will be used to transfer a fluid called dialysate to and from your abdomen. At the start of a session, your abdomen is filled with dialysate. During the session, wastes, salt, and extra water in the blood pass through the peritoneum and into the dialysate. The dialysate is drained from the body at the end of the session. The process of filling and draining the dialysate is called an exchange. Exchanges are repeated until you have used up all the dialysate for the day. °Peritoneal dialysis may be performed by you at home or at almost any other location. It is done every day. You may need up to five exchanges a day. The amount of time the dialysate is in your body between exchanges is called a dwell. The dwell depends on the number of exchanges needed and the characteristics of the peritoneum. It usually varies from 1.5 3 hours. You may go about your day normally between exchanges. Alternately, the exchanges may be done at night while you sleep using a machine called a cycler. °CHOOSING HEMODIALYSIS OR   PERITONEAL DIALYSIS  °Both hemodialysis and peritoneal dialysis have advantages and disadvantages. Talk to your caregiver about which type of dialysis would be best for you. Your lifestyle and preferences should be considered along with your medical condition. In some cases, only one type of dialysis may be an option.  °Advantages of hemodialysis °· It is done less often than peritoneal dialysis. °· Someone else can do the dialysis for you. °· If you go to a  dialysis center, your caregiver will be able to recognize any problems right away. °· If you go to a dialysis center, you can interact with others who are having dialysis. This can provide you with emotional support. °Disadvantages of hemodialysis °· Hemodialysis may cause cramps and low blood pressure. It may leave you feeling tired on the days you have the treatment. °· If you go to a dialysis center, you will need to make weekly appointments and work around the center's schedule. °· You will need to take extra care when traveling. If you go to a dialysis center, you will need to make special arrangements to visit a dialysis center near your destination. If you are having treatments at home, you will need to take the dialyzer with you to your destination. °· You will need to avoid more foods than you would need to avoid on peritoneal dialysis. °Advantages of peritoneal dialysis °· It is less likely than hemodialysis to cause cramps and low blood pressure. °· You may do exchanges on your own wherever you are, including when you travel. °· You do not need to avoid as many foods as you do on hemodialysis. °Disadvantages of Peritoneal Dialysis °· It is done more often than hemodialysis. °· Performing peritoneal dialysis requires you to have dexterity of the hands. You must also be able to lift bags. °· You will have to learn sterilization techniques. You will need to practice them every day to reduce the risk of infection.  °DIALYSIS DIET °Both hemodialysis and peritoneal dialysis require you to make some changes to your diet. For example, you will need to limit your intake of foods high in the minerals phosphorus and potassium. You will also need to limit your fluid intake. Your dietitian can help you plan meals. A good meal plan can improve your dialysis and your health.  °WHAT TO EXPECT  °Adjusting to the dialysis treatment, schedule, and diet can take some time. You may need to stop working and may not be able to  do some of the things you normally do. You may feel anxious or depressed when beginning dialysis. Eventually, many people feel better overall because of dialysis. Some people are able to return to work after making some changes, such as reducing work intensity. °FOR MORE INFORMATION  °National Kidney Foundation: www.kidney.org °American Association of Kidney Patients: www.aakp.org  °American Kidney Fund: www.kidneyfund.org °Document Released: 03/19/2002 Document Revised: 08/29/2012 Document Reviewed: 02/21/2012 °ExitCare® Patient Information ©2014 [employer]. ° °

## 2013-04-03 NOTE — H&P (View-Only) (Signed)
Referred by:  [name], MD [place], [place] [id number]  Reason for referral: New access  History of Present Illness  Jon [name] is a 63 y.o. (11-Nov-1950) male who presents for evaluation for permanent access.  The patient is right hand dominant.  The patient has not had previous access procedures.  Previous central venous cannulation procedures include: none.  The patient has never had a PPM placed.  The patient has a living related kidney offer and he is in the process of pursuing such.  He was told he has at least 6 months before needing HD.  Past Medical History  Diagnosis Date  . Hypertension     takes Amlodipine and Catapres daily  . Hyperlipidemia     takes Lovastatin daily  . Sleep apnea   . Arthritis   . Chronic back pain     radiculopathy and stenosis  . History of colon polyps   . H/O hiatal hernia   . Urinary frequency   . Peripheral edema     takes Lasix daily  . Nocturia   . Diabetes mellitus     Levemir nightly ;type 2  . CVA (cerebral infarction) 03/2013  . Chronic kidney disease   . Stroke     Past Surgical History  Procedure Laterality Date  . Right leg surgery      pin in place  . Neuroplasty / transposition median nerve at carpal tunnel bilateral    . Back surgery    . Right wrist surgery    . Left knee surgery    . Hemorrhoid surgery    . Colonoscopy    . Esophagogastroduodenoscopy    . Loop recorder implant  03/19/13    MDT LinQ implanted by Dr Rayann Heman for cryptogenic stroke  . Tee without cardioversion N/A 03/19/2013    Procedure: TRANSESOPHAGEAL ECHOCARDIOGRAM (TEE);  Surgeon: [name], MD;  Location: [facility] ENDOSCOPY;  Service: Cardiovascular;  Laterality: N/A;    History   Social History  . Marital Status: Married    Spouse Name: N/A    Number of Children: N/A  . Years of Education: N/A   Occupational History  . Not on file.   Social History Main Topics  . Smoking status: Former Smoker -- 0.50  packs/day for 40 years    Types: Cigarettes    Quit date: 12/27/2012  . Smokeless tobacco: Never Used  . Alcohol Use: No  . Drug Use: No  . Sexual Activity: Yes   Other Topics Concern  . Not on file   Social History Narrative  . No narrative on file    Family History  Problem Relation Age of Onset  . Hypertension Mother   . Hyperlipidemia Father   . Hypertension Father   . Heart disease Father     before age 54  . Diabetes Brother   . Hyperlipidemia Son    Current Outpatient Prescriptions on File Prior to Visit  Medication Sig Dispense Refill  . amLODipine (NORVASC) 10 MG tablet Take 10 mg by mouth daily.      [name] aspirin EC 325 MG EC tablet Take 1 tablet (325 mg total) by mouth daily.  30 tablet  0  . calcitRIOL (ROCALTROL) 0.25 MCG capsule Take 0.25 mcg by mouth daily.      . Calcium Citrate-Vitamin D (CALCIUM CITRATE +D PO) Take 1 tablet by mouth 2 (two) times daily.      . cloNIDine (CATAPRES) 0.3  MG tablet Take 0.3 mg by mouth 3 (three) times daily.      . cyclobenzaprine (FLEXERIL) 10 MG tablet Take 10 mg by mouth 3 (three) times daily as needed for muscle spasms.      . furosemide (LASIX) 80 MG tablet Take 80 mg by mouth 2 (two) times daily.      [name] HYDROcodone-acetaminophen (NORCO) 10-325 MG per tablet Take 1 tablet by mouth every 4 (four) hours as needed for moderate pain.       [name] insulin glargine (LANTUS) 100 UNIT/ML injection Inject 30 Units into the skin at bedtime.      . lovastatin (MEVACOR) 40 MG tablet Take 40 mg by mouth at bedtime.      . tamsulosin (FLOMAX) 0.4 MG CAPS capsule Take 0.4 mg by mouth daily after breakfast.       No current facility-administered medications on file prior to visit.    Allergies  Allergen Reactions  . Lisinopril Other (See Comments)    Increased potassium level   . Meloxicam Nausea And Vomiting  . Victoza [Liraglutide] Diarrhea, Nausea And Vomiting and Swelling    REVIEW OF SYSTEMS:  (Positives checked otherwise  negative)  CARDIOVASCULAR:  []  chest pain, []  chest pressure, []  palpitations, []  shortness of breath when laying flat, []  shortness of breath with exertion,  []  pain in feet when walking, []  pain in feet when laying flat, []  history of blood clot in veins (DVT), []  history of phlebitis, [x]  swelling in legs, []  varicose veins  PULMONARY:  []  productive cough, []  asthma, []  wheezing  NEUROLOGIC:  []  weakness in arms or legs, []  numbness in arms or legs, []  difficulty speaking or slurred speech, []  temporary loss of vision in one eye, []  dizziness  HEMATOLOGIC:  []  bleeding problems, []  problems with blood clotting too easily  MUSCULOSKEL:  []  joint pain, []  joint swelling  GASTROINTEST:  []  vomiting blood, []  blood in stool     GENITOURINARY:  []  burning with urination, []  blood in urine  PSYCHIATRIC:  []  history of major depression  INTEGUMENTARY:  []  rashes, []  ulcers  CONSTITUTIONAL:  []  fever, []  chills  Physical Examination  Filed Vitals:   03/22/13 1446  BP: 121/61  Pulse: 51  Height: 5\' 11"  (1.803 m)  Weight: 235 lb 9.6 oz (106.867 kg)  SpO2: 100%   Body mass index is 32.87 kg/(m^2).  General: A&O x 3, WD, mildly obese  Head: [place]/AT  Ear/Nose/Throat: Hearing grossly intact, nares w/o erythema or drainage, oropharynx w/o Erythema/Exudate, Mallampati score: 3  Eyes: PERRLA, EOMI  Neck: Supple, no nuchal rigidity, no palpable LAD  Pulmonary: Sym exp, good air movt, CTAB, no rales, rhonchi, & wheezing  Cardiac: RRR, Nl S1, S2, no Murmurs, rubs or gallops  Vascular: Vessel Right Left  Radial Faintly Palpable Faintly Palpable  Ulnar Not Palpable Not Palpable  Brachial Faintly Palpable Palpable  Carotid  Palpable, without bruit Palpable, without bruit  Aorta Not palpable N/A  Femoral Palpable Palpable  Popliteal Not palpable Not palpable  PT Not Palpable Not Palpable  DP Not Palpable Not Palpable   Gastrointestinal: soft, NTND, -G/R, - HSM, - masses, - CVAT  B  Musculoskeletal: M/S 5/5 throughout , Extremities without ischemic changes   Neurologic: CN 2-12 intact , Pain and light touch intact in extremities , Motor exam as listed above  Psychiatric: Judgment intact, Mood & affect appropriate for pt's clinical situation  Dermatologic: See M/S exam for extremity exam, no rashes otherwise  noted  Lymph : No Cervical, Axillary, or Inguinal lymphadenopathy   Non-Invasive Vascular Imaging  Vein Mapping  (Date: 03/22/2013):   R arm: acceptable vein conduits include upper arm cephalic and basilic  L arm: acceptable vein conduits include possible forearm cephalic, definitely upper arm cephalic, short basilic  Outside Studies/Documentation 20 pages of outside documents were reviewed including: outpatient nephrology chart.  Medical Decision Making  Jon [name] is a 63 y.o. male who presents with chronic kidney disease stage IV, likely PAD   This patient's arm and leg pulses are not particularly easy to palpate so I suspect some degree of global atherosclerosis.  Based on vein mapping and examination, this patient's permanent access options include: R BC AVF, R stage BVT, L RC vs BC AVF.  I would start with a L arm fistula placement.  I had an extensive discussion with this patient in regards to the nature of access surgery, including risk, benefits, and alternatives.    The patient is aware that the risks of access surgery include but are not limited to: bleeding, infection, steal syndrome, nerve damage, ischemic monomelic neuropathy, failure of access to mature, and possible need for additional access procedures in the future.  The patient want to discuss things with Dr. [name].  He would like to go straight to the kidney transplant.  Adele Barthel, MD [facility] Office: [phone] Pager: [phone]  03/22/2013, 5:22 PM

## 2013-04-03 NOTE — Transfer of Care (Signed)
Immediate Anesthesia Transfer of Care Note  Patient: Jon [name]  Procedure(s) Performed: Procedure(s): ARTERIOVENOUS (AV) FISTULA CREATION- LEFT RADIOCEPHALIC VS BRACHIOCEPHALIC (Left)  Patient Location: PACU  Anesthesia Type:General  Level of Consciousness: awake, alert  and oriented  Airway & Oxygen Therapy: Patient Spontanous Breathing  Post-op Assessment: Report given to PACU RN  Post vital signs: Reviewed and stable  Complications: No apparent anesthesia complications

## 2013-04-03 NOTE — Interval H&P Note (Signed)
History and Physical Interval Note:  04/03/2013 11:57 AM  Jon [name]  has presented today for surgery, with the diagnosis of End Stage Renal Disease  The various methods of treatment have been discussed with the patient and family. After consideration of risks, benefits and other options for treatment, the patient has consented to  Procedure(s): ARTERIOVENOUS (AV) FISTULA CREATION- LEFT RADIOCEPHALIC VS BRACHIOCEPHALIC (Left) as a surgical intervention .  The patient's history has been reviewed, patient examined, no change in status, stable for surgery.  I have reviewed the patient's chart and labs.  Questions were answered to the patient's satisfaction.     [name]

## 2013-04-03 NOTE — Op Note (Signed)
OPERATIVE NOTE   PROCEDURE: left radiocephaic arteriovenous fistula placement  PRE-OPERATIVE DIAGNOSIS: chronic kidney disease stage IV-V  POST-OPERATIVE DIAGNOSIS: same as above   SURGEON: Hinda Lenis, MD  ASSISTANT(S): Gerri Lins, PAC   ANESTHESIA: general  ESTIMATED BLOOD LOSS: 50 cc  FINDING(S): 1.  Cephalic vein [id number] mm externally 2.  Radial artery 2.5 mm, soft 3.  Weakly palpable thrill at end of case 4.  Dopplerable radial signal without significant augmentation.  SPECIMEN(S):  none  INDICATIONS:   Jon [name] is a 63 y.o. male who presents with chronic kidney disease stage IV-V .  The patient is scheduled for left radiocephalic vs. brachiocephalic arteriovenous fistula placement.  The patient is aware the risks include but are not limited to: bleeding, infection, steal syndrome, nerve damage, ischemic monomelic neuropathy, failure to mature, and need for additional procedures.  The patient is aware of the risks of the procedure and elects to proceed forward.  DESCRIPTION: After full informed written consent was obtained from the patient, the patient was brought back to the operating room and placed supine upon the operating table.  Prior to induction, the patient received IV antibiotics.   After obtaining adequate anesthesia, the patient was then prepped and draped in the standard fashion for a left arm access procedure.  I turned my attention first to identifying the patient's distal cephalic vein and radial artery.  Using SonoSite guidance, the location of these vessels were marked out on the skin.   I made a longitudinal incision at the level of the wrist and dissected through the subcutaneous tissue and fascia to gain exposure of the radial artery.  This was noted to be 2.5 mm in diameter externally.  This was dissected out proximally and distally.  I then made an separate longitudinal incision over the distal cephalic vein and dissected out the  cephalic vein.  This was noted to be 3-3.5 mm in diameter externally.  The distal segment of the vein was ligated with a  2-0 silk, and the vein was transected.  The proximal segment was iinterrogated with serial dilators.  The vein accepted up to a 4 mm dilator without any difficulty.  I then instilled the heparinized saline into the vein and clamped it.  I dissected bluntly a tunnel between the two incisions and passed the vein through the tunnel.    At this point, I reset my exposure of the radial artery and clamp the artery proximally and distally.  I made an arteriotomy with a #11 blade, and then I extended the arteriotomy with a Potts scissor.  I injected heparinized saline proximal and distal to this arteriotomy.  The vein was then sewn to the artery in an end-to-side configuration with a running stitch of 7-0 Prolene.  Prior to completing this anastomosis, I allowed the vein and artery to backbleed.  There was no evidence of clot from any vessels.  I completed the anastomosis in the usual fashion and then released all vessel loops and clamps.  There was a weakly palpable  thrill in the venous outflow, and there was a dopplerable radial signal that did not augment much with compression of venous outflow.  At this point, I irrigated out the surgical wound.  Bleeding points in the anastomosis were repaired with simple stitches of 7-0 Prolene.  I applied thrombin and gelfoam throughout both incision.    At this point, I noted a sizable branch 2 cm proximal to the segment of vein used for the  anastomosis.  I made a stab incision and dissected out the branch.  The branch was ligated with interrupted ties of 3-0 silk.  The branch was transection.  This incision was repaired with a U-stitch of 4-0 Monocryl.    At this point, the gelfoam was removed from the two other incisions.  There was no further active bleeding.  The subcutaneous tissue in both incision was reapproximated with a running stitch of 3-0  Vicryl.  The skin was then reapproximated with a running subcuticular stitch of 4-0 Monocryl in both incisions.  The skin was then cleaned, dried, and reinforced with Dermabond in all incisions.  The patient tolerated this procedure well.   COMPLICATIONS: none  CONDITION: stable   Hinda Lenis, MD 04/03/2013 2:16 PM

## 2013-04-03 NOTE — Telephone Encounter (Addendum)
Message copied by Gena Fray on Wed Apr 03, 2013  3:31 PM ------      Message from: Peter Minium K      Created: Wed Apr 03, 2013  2:35 PM      Regarding: Schedule                   ----- Message -----         From: Conrad Backus, MD         Sent: 04/03/2013   2:24 PM           To: [facility] [address]            AMERIGO ORBACH      [id number]      May 17, 1950            PROCEDURE:      left radiocephaic arteriovenous fistula placement            Asst: Gerri Lins, [facility]             Follow-up: 6 weeks ------  04/03/13: spoke with pt, dpm

## 2013-04-03 NOTE — Anesthesia Preprocedure Evaluation (Signed)
Anesthesia Evaluation  Patient identified by MRN, date of birth, ID band Patient awake    Reviewed: Allergy & Precautions, H&P , NPO status , Patient's Chart, lab work & pertinent test results  Airway Mallampati: II  Neck ROM: full    Dental   Pulmonary sleep apnea , former smoker,          Cardiovascular hypertension,     Neuro/Psych CVA    GI/Hepatic hiatal hernia,   Endo/Other  diabetes, Type 2obese  Renal/GU Renal InsufficiencyRenal disease     Musculoskeletal   Abdominal   Peds  Hematology   Anesthesia Other Findings   Reproductive/Obstetrics                           Anesthesia Physical Anesthesia Plan  ASA: III  Anesthesia Plan: General   Post-op Pain Management:    Induction: Intravenous  Airway Management Planned: LMA  Additional Equipment:   Intra-op Plan:   Post-operative Plan:   Informed Consent: I have reviewed the patients History and Physical, chart, labs and discussed the procedure including the risks, benefits and alternatives for the proposed anesthesia with the patient or authorized representative who has indicated his/her understanding and acceptance.     Plan Discussed with: CRNA, Anesthesiologist and Surgeon  Anesthesia Plan Comments:         Anesthesia Quick Evaluation

## 2013-04-04 NOTE — Anesthesia Postprocedure Evaluation (Signed)
Anesthesia Post Note  Patient: Jon [name]  Procedure(s) Performed: Procedure(s) (LRB): ARTERIOVENOUS (AV) FISTULA CREATION- LEFT RADIOCEPHALIC VS BRACHIOCEPHALIC (Left)  Anesthesia type: General  Patient location: PACU  Post pain: Pain level controlled and Adequate analgesia  Post assessment: Post-op Vital signs reviewed, Patient's Cardiovascular Status Stable, Respiratory Function Stable, Patent Airway and Pain level controlled  Last Vitals:  Filed Vitals:   04/03/13 1530  BP: 135/73  Pulse: 55  Temp: 36.6 C  Resp:     Post vital signs: Reviewed and stable  Level of consciousness: awake, alert  and oriented  Complications: No apparent anesthesia complications

## 2013-04-05 ENCOUNTER — Encounter (HOSPITAL_COMMUNITY): Payer: Self-pay | Admitting: Vascular Surgery

## 2013-04-16 ENCOUNTER — Encounter: Payer: Self-pay | Admitting: Internal Medicine

## 2013-04-19 ENCOUNTER — Ambulatory Visit (INDEPENDENT_AMBULATORY_CARE_PROVIDER_SITE_OTHER): Payer: Medicare Other | Admitting: *Deleted

## 2013-04-19 DIAGNOSIS — I6350 Cerebral infarction due to unspecified occlusion or stenosis of unspecified cerebral artery: Secondary | ICD-10-CM

## 2013-04-19 DIAGNOSIS — I639 Cerebral infarction, unspecified: Secondary | ICD-10-CM

## 2013-04-20 LAB — MDC_IDC_ENUM_SESS_TYPE_REMOTE
MDC IDC SESS DTM: 20150410035844
MDC IDC SET ZONE DETECTION INTERVAL: 3000 ms
Zone Setting Detection Interval: 2000 ms
Zone Setting Detection Interval: 360 ms

## 2013-04-29 ENCOUNTER — Ambulatory Visit: Payer: Medicare Other | Admitting: Cardiology

## 2013-04-30 ENCOUNTER — Ambulatory Visit: Payer: Medicare Other | Admitting: Physician Assistant

## 2013-05-01 ENCOUNTER — Encounter: Payer: Self-pay | Admitting: *Deleted

## 2013-05-02 ENCOUNTER — Ambulatory Visit (INDEPENDENT_AMBULATORY_CARE_PROVIDER_SITE_OTHER): Payer: Medicare Other | Admitting: Physician Assistant

## 2013-05-02 ENCOUNTER — Encounter: Payer: Self-pay | Admitting: Physician Assistant

## 2013-05-02 ENCOUNTER — Telehealth: Payer: Self-pay

## 2013-05-02 VITALS — BP 140/70 | HR 60 | Ht 71.0 in | Wt 235.0 lb

## 2013-05-02 DIAGNOSIS — I10 Essential (primary) hypertension: Secondary | ICD-10-CM

## 2013-05-02 DIAGNOSIS — G4733 Obstructive sleep apnea (adult) (pediatric): Secondary | ICD-10-CM | POA: Insufficient documentation

## 2013-05-02 DIAGNOSIS — Z01810 Encounter for preprocedural cardiovascular examination: Secondary | ICD-10-CM

## 2013-05-02 DIAGNOSIS — E785 Hyperlipidemia, unspecified: Secondary | ICD-10-CM

## 2013-05-02 DIAGNOSIS — I639 Cerebral infarction, unspecified: Secondary | ICD-10-CM

## 2013-05-02 DIAGNOSIS — F17200 Nicotine dependence, unspecified, uncomplicated: Secondary | ICD-10-CM

## 2013-05-02 DIAGNOSIS — N184 Chronic kidney disease, stage 4 (severe): Secondary | ICD-10-CM

## 2013-05-02 DIAGNOSIS — I6350 Cerebral infarction due to unspecified occlusion or stenosis of unspecified cerebral artery: Secondary | ICD-10-CM

## 2013-05-02 HISTORY — DX: Hyperlipidemia, unspecified: E78.5

## 2013-05-02 HISTORY — DX: Obstructive sleep apnea (adult) (pediatric): G47.33

## 2013-05-02 NOTE — Telephone Encounter (Signed)
Rec'd phone call from Dr. Etheleen Nicks office. Reported that the left R-C AVF placed 3/25, is not functioning.  Dr. [name] is requesting to schedule pt. for an upper arm AVF, instead of bringing back to the office to evaluate. Will discuss w/ Dr. Bridgett Larsson.

## 2013-05-02 NOTE — Telephone Encounter (Signed)
Per Dr. Bridgett Larsson, if fistula has failed, may schedule left B-C AVF without office visit.  Will contact pt. to reschedule surgery.

## 2013-05-02 NOTE — Patient Instructions (Signed)
Your physician recommends that you schedule a follow-up appointment in: 1-2 months with Dr. Thompson Grayer.  Your physician has requested that you have a stress test (lexiscan myoview). For further information please visit [email or web address]. Please follow instruction sheet, as given.

## 2013-05-02 NOTE — Progress Notes (Signed)
[place], [place], [place]  [address] Phone: [phone] Fax:  [phone]  Date:  05/02/2013   ID:  Rajat, Staver [birth year], MRN [id number]  PCP:  [name]., MD  Cardiologist:  Dr. Thompson Grayer      History of Present Illness: ROSHAD HACK is a 63 y.o. male with the history of HTN, HL, diabetes, CKD. He was admitted 7/6-7/20 with an embolic left MCA infarct. He received tPA. Echocardiogram demonstrated normal LV function.  TEE was negative for source of embolus. He was seen by Dr. Rayann Heman in consultation.  Patient underwent implantation of loop recorder ([employer]tr[employer] LINQ) given recent cryptogenic stroke.  He is being considered for renal transplant at [facility].  He returns for surgical clearance.  He is doing well since d/c from the hospital.  He has some mild residual R hand and leg weakness.  No speech difficulty.  He does not require PT.  He denies chest pain or dyspnea.  He has been fairly sedentary since his stroke.  He denies orthopnea, PND, edema.  No syncope.    Studies:   - Echo (03/17/13):  Mild LVH, EF 55-60%, normal wall motion  - TEE (03/19/13): EF 55-60%, normal wall motion, no source of embolus  - Carotid US (03/17/13):  Bilateral ICA 1-39%   Recent Labs: 03/16/2013: ALT 11; Creatinine 4.71*; HDL Cholesterol 34*; LDL (calc) 62  04/02/2013: Hemoglobin 11.2*; Potassium 4.4   Wt Readings from Last 3 Encounters:  04/03/13 235 lb (106.595 kg)  04/03/13 235 lb (106.595 kg)  04/02/13 235 lb 6.4 oz (106.777 kg)     Past Medical History  Diagnosis Date  . Hyperlipidemia     takes Lovastatin daily  . Arthritis   . Chronic back pain     radiculopathy and stenosis  . History of colon polyps   . H/O hiatal hernia   . Urinary frequency   . Peripheral edema     takes Lasix daily  . Nocturia   . Diabetes mellitus     Levemir nightly ;type 2  . CVA (cerebral infarction) 03/2013  . Chronic kidney disease   . Sleep apnea     cpap           .  Stroke     speech, rt arm weakness  . Hypertension     takes Amlodipine and Catapres daily    dr Forrestine Him    Current Outpatient Prescriptions  Medication Sig Dispense Refill  . amLODipine (NORVASC) 10 MG tablet Take 10 mg by mouth daily.      [name] aspirin EC 325 MG EC tablet Take 1 tablet (325 mg total) by mouth daily.  30 tablet  0  . Blood Glucose Monitoring Suppl (ONE TOUCH ULTRA MINI) W/DEVICE KIT       . calcitRIOL (ROCALTROL) 0.25 MCG capsule Take 0.25 mcg by mouth daily.      . Calcium Citrate-Vitamin D (CALCIUM CITRATE +D PO) Take 1 tablet by mouth 2 (two) times daily.      . cloNIDine (CATAPRES) 0.3 MG tablet Take 0.3 mg by mouth 3 (three) times daily.      . cyclobenzaprine (FLEXERIL) 10 MG tablet Take 10 mg by mouth 3 (three) times daily as needed for muscle spasms.      . furosemide (LASIX) 80 MG tablet Take 80 mg by mouth 2 (two) times daily. 137m every am and 855min the pm      . HYDROcodone-acetaminophen ([place]  10-325 MG per tablet Take 1 tablet by mouth every 4 (four) hours as needed for moderate pain.  30 tablet  0  . insulin glargine (LANTUS) 100 UNIT/ML injection Inject 30 Units into the skin at bedtime.      [name] LANTUS SOLOSTAR 100 UNIT/ML Solostar Pen       . lovastatin (MEVACOR) 40 MG tablet Take 40 mg by mouth at bedtime.      . ONE TOUCH ULTRA TEST test strip       . ONETOUCH DELICA LANCETS 22G MISC       . tamsulosin (FLOMAX) 0.4 MG CAPS capsule Take 0.4 mg by mouth daily after breakfast.       No current facility-administered medications for this visit.    Allergies:   Lisinopril; Meloxicam; and Victoza   Social History:  The patient  reports that he quit smoking about 4 months ago. His smoking use included Cigarettes. He has a 20 pack-year smoking history. He has never used smokeless tobacco. He reports that he does not drink alcohol or use illicit drugs.   Family History:  The patient's family history includes Diabetes in his brother; Heart disease in his  father; Hyperlipidemia in his father and son; Hypertension in his father and mother.   ROS:  Please see the history of present illness.      All other systems reviewed and negative.   PHYSICAL EXAM: VS:  BP 140/70  Pulse 60  Ht _0  (1.803 m)  Wt 235 lb (106.595 kg)  BMI 32.79 kg/m2 Well nourished, well developed, in no acute distress HEENT: normal Neck: no JVD Cardiac:  normal S1, S2; RRR; 1/6 systolic murmur at LUSB Lungs:  Decreased breath sounds bilaterally, no wheezing, rhonchi or rales Abd: soft, nontender, no hepatomegaly Ext: trace bilateral LE edema Skin: warm and dry Neuro:  CNs 2-12 intact, no focal abnormalities noted  EKG:  NSR, HR 60, normal axis, NSSTTW changes     ASSESSMENT AND PLAN:  1. Pre-operative cardiovascular examination:  He has a lot of risk factors for CAD (HTN, HL, prior smoker).  He cannot really achieve 4 METs.  He is being considered for renal transplant.  I will arrange a Lexiscan Myoview for risk stratification.   2. Stroke:  Continue f/u with neurology as planned.  He is s/p ILR given hx of cryptogenic CVA to monitor for AFib. 3. HTN (hypertension):  Controlled.  4. HLD (hyperlipidemia):  Continue statin 5. Chronic kidney disease (CKD), stage IV (severe):  F/u with Dr. [name] and transplant team at [facility]. 6. Tobacco use disorder:  He has quit smoking. 7. OSA (obstructive sleep apnea):  Continue CPAP. 8. Disposition:  F/u with Dr. Thompson Grayer in 1-2 mos.  Signed, Richardson Dopp, PA-C  05/02/2013 8:45 AM

## 2013-05-08 ENCOUNTER — Encounter (HOSPITAL_COMMUNITY): Payer: Self-pay | Admitting: Pharmacy Technician

## 2013-05-08 ENCOUNTER — Telehealth: Payer: Self-pay | Admitting: Internal Medicine

## 2013-05-08 ENCOUNTER — Other Ambulatory Visit: Payer: Self-pay

## 2013-05-08 NOTE — Telephone Encounter (Signed)
Spoke with pt who reports he is going in for a fistula replacement by Dr. Bridgett Larsson on May 14, 2013. This will be placed in left upper arm.  Pt is asking if OK to proceed with Lexiscan on May 16, 2013.

## 2013-05-08 NOTE — Telephone Encounter (Signed)
Agree Richardson Dopp, PA-C   05/08/2013 5:36 PM

## 2013-05-08 NOTE — Telephone Encounter (Signed)
New message    Patient having a fistula done on  5/5 . Wife has some concerns about nuclear testing on  5/7 .

## 2013-05-08 NOTE — Telephone Encounter (Signed)
Left message to call back  

## 2013-05-08 NOTE — Telephone Encounter (Signed)
Reviewed with Richardson Dopp, PA and nuclear medicine department and should be OK to have [place] on May 7 as planned. I spoke with pt and gave him this information.

## 2013-05-09 NOTE — Pre-Procedure Instructions (Signed)
JERALDO VIVIER  05/09/2013   Your procedure is scheduled on:  Tuesday, May 5.  Report to [facility] Admitting at 5:30 AM.  Call this number if you have problems the morning of surgery: [phone]   Remember:   Do not eat food or drink liquids after midnight.   Take these medicines the morning of surgery with A SIP OF WATER:amLODipine (NORVASC), cloNIDine (CATAPRES), tamsulosin (FLOMAX).    Do not wear jewelry, make-up or nail polish.  Do not wear lotions, powders, or perfumes.              Men may shave face and neck.  Do not bring valuables to the hospital.               [facility] is not responsible for any belongings or valuables.               Contacts, dentures or bridgework may not be worn into surgery.  Leave suitcase in the car. After surgery it may be brought to your room.  For patients admitted to the hospital, discharge time is determined by your treatment team.               Patients discharged the day of surgery will not be allowed to drive home.  Name and phone number of your driver: -    Special Instructions: -   Please read over the following fact sheets that you were given: Pain Booklet, Coughing and Deep Breathing and Surgical Site Infection Prevention

## 2013-05-10 ENCOUNTER — Encounter (HOSPITAL_COMMUNITY): Payer: Self-pay

## 2013-05-10 ENCOUNTER — Encounter (HOSPITAL_COMMUNITY)
Admission: RE | Admit: 2013-05-10 | Discharge: 2013-05-10 | Disposition: A | Discharge status: Home / Self Care | Payer: Medicare Other | Source: Ambulatory Visit | Attending: Vascular Surgery | Admitting: Vascular Surgery

## 2013-05-10 DIAGNOSIS — Z01818 Encounter for other preprocedural examination: Secondary | ICD-10-CM | POA: Insufficient documentation

## 2013-05-10 HISTORY — DX: Gastro-esophageal reflux disease without esophagitis: K21.9

## 2013-05-13 MED ORDER — CHLORHEXIDINE GLUCONATE 4 % EX LIQD
60.0000 mL | Freq: Once | CUTANEOUS | Status: DC
  Filled 2013-05-13: qty 60

## 2013-05-13 MED ORDER — DEXTROSE 5 % IV SOLN
1.5000 g | INTRAVENOUS | Status: AC
  Administered 2013-05-14: 1.5 g via INTRAVENOUS
  Filled 2013-05-13: qty 1.5

## 2013-05-13 NOTE — Progress Notes (Signed)
Anesthesia Chart Review:  Patient is a 63 year old male scheduled for creation of left brachiocephalic AVF on 0000000 by Dr. Bridgett Larsson.  He is s/p left radiocephalic AVF on [id number].  See my note on 04/02/13.  Since then he was seen at [facility] by Richardson Dopp, PA-C for surgical clearance for renal transplant at [facility].  He is scheduled for routine Lexiscan Myoview for risk stratification on 05/16/13. He was not having chest pain or dyspnea. Patient did notify Richardson Dopp of plans for surgery on 5/50/15, and notes indicate that Nicki Reaper felt it was okay to leave Myoview scheduled for 05/16/13 (see telephone encounter 05/08/13 from [place], [place]).   EKG on 05/02/13 showed NSR, non-specific ST/T wave changes (may be due to baseline wanderer most of his precordial leads). Of note, he does have a loop recorder--see my previous note for details.  He is for an ISTAT on the day of surgery. If results are acceptable and otherwise not acute changes then I would anticipate that he could proceed as planned.  George Hugh [facility] Short Stay Center/Anesthesiology Phone [phone] 05/13/2013 10:43 AM

## 2013-05-14 ENCOUNTER — Telehealth: Payer: Self-pay | Admitting: Vascular Surgery

## 2013-05-14 ENCOUNTER — Encounter (HOSPITAL_COMMUNITY)
Admission: RE | Disposition: A | Discharge status: Home / Self Care | Payer: Self-pay | Source: Ambulatory Visit | Attending: Vascular Surgery

## 2013-05-14 ENCOUNTER — Ambulatory Visit (HOSPITAL_COMMUNITY)
Admission: RE | Admit: 2013-05-14 | Discharge: 2013-05-14 | Disposition: A | Discharge status: Home / Self Care | Payer: Medicare Other | Source: Ambulatory Visit | Attending: Vascular Surgery | Admitting: Vascular Surgery

## 2013-05-14 ENCOUNTER — Ambulatory Visit (HOSPITAL_COMMUNITY): Payer: Medicare Other | Admitting: Anesthesiology

## 2013-05-14 ENCOUNTER — Encounter (HOSPITAL_COMMUNITY): Payer: Self-pay | Admitting: *Deleted

## 2013-05-14 ENCOUNTER — Other Ambulatory Visit: Payer: Self-pay | Admitting: *Deleted

## 2013-05-14 ENCOUNTER — Encounter (HOSPITAL_COMMUNITY): Payer: Medicare Other | Admitting: Vascular Surgery

## 2013-05-14 DIAGNOSIS — Z888 Allergy status to other drugs, medicaments and biological substances status: Secondary | ICD-10-CM | POA: Insufficient documentation

## 2013-05-14 DIAGNOSIS — G8929 Other chronic pain: Secondary | ICD-10-CM | POA: Insufficient documentation

## 2013-05-14 DIAGNOSIS — Z4931 Encounter for adequacy testing for hemodialysis: Secondary | ICD-10-CM

## 2013-05-14 DIAGNOSIS — N186 End stage renal disease: Secondary | ICD-10-CM | POA: Insufficient documentation

## 2013-05-14 DIAGNOSIS — K219 Gastro-esophageal reflux disease without esophagitis: Secondary | ICD-10-CM | POA: Insufficient documentation

## 2013-05-14 DIAGNOSIS — Z794 Long term (current) use of insulin: Secondary | ICD-10-CM | POA: Insufficient documentation

## 2013-05-14 DIAGNOSIS — Z7982 Long term (current) use of aspirin: Secondary | ICD-10-CM | POA: Insufficient documentation

## 2013-05-14 DIAGNOSIS — E785 Hyperlipidemia, unspecified: Secondary | ICD-10-CM | POA: Insufficient documentation

## 2013-05-14 DIAGNOSIS — Z79899 Other long term (current) drug therapy: Secondary | ICD-10-CM | POA: Insufficient documentation

## 2013-05-14 DIAGNOSIS — E119 Type 2 diabetes mellitus without complications: Secondary | ICD-10-CM | POA: Insufficient documentation

## 2013-05-14 DIAGNOSIS — Z8673 Personal history of transient ischemic attack (TIA), and cerebral infarction without residual deficits: Secondary | ICD-10-CM | POA: Insufficient documentation

## 2013-05-14 DIAGNOSIS — M549 Dorsalgia, unspecified: Secondary | ICD-10-CM | POA: Insufficient documentation

## 2013-05-14 DIAGNOSIS — I120 Hypertensive chronic kidney disease with stage 5 chronic kidney disease or end stage renal disease: Secondary | ICD-10-CM | POA: Insufficient documentation

## 2013-05-14 DIAGNOSIS — G4730 Sleep apnea, unspecified: Secondary | ICD-10-CM | POA: Insufficient documentation

## 2013-05-14 DIAGNOSIS — N184 Chronic kidney disease, stage 4 (severe): Secondary | ICD-10-CM

## 2013-05-14 DIAGNOSIS — R609 Edema, unspecified: Secondary | ICD-10-CM | POA: Insufficient documentation

## 2013-05-14 DIAGNOSIS — N185 Chronic kidney disease, stage 5: Secondary | ICD-10-CM

## 2013-05-14 DIAGNOSIS — Z992 Dependence on renal dialysis: Secondary | ICD-10-CM | POA: Insufficient documentation

## 2013-05-14 DIAGNOSIS — Z87891 Personal history of nicotine dependence: Secondary | ICD-10-CM | POA: Insufficient documentation

## 2013-05-14 HISTORY — PX: AV FISTULA PLACEMENT: SHX1204

## 2013-05-14 LAB — POCT I-STAT 4, (NA,K, GLUC, HGB,HCT)
Glucose, Bld: 155 mg/dL — ABNORMAL HIGH (ref 70–99)
HCT: 35 % — ABNORMAL LOW (ref 39.0–52.0)
Hemoglobin: 11.9 g/dL — ABNORMAL LOW (ref 13.0–17.0)
Potassium: 4.5 mEq/L (ref 3.7–5.3)
Sodium: 141 mEq/L (ref 137–147)

## 2013-05-14 LAB — GLUCOSE, CAPILLARY
Glucose-Capillary: 168 mg/dL — ABNORMAL HIGH (ref 70–99)
Glucose-Capillary: 148 mg/dL — ABNORMAL HIGH (ref 70–99)

## 2013-05-14 SURGERY — ARTERIOVENOUS (AV) FISTULA CREATION
Anesthesia: Monitor Anesthesia Care | Site: Arm Upper | Laterality: Left

## 2013-05-14 MED ORDER — MIDAZOLAM HCL 2 MG/2ML IJ SOLN
INTRAMUSCULAR | Status: AC
  Filled 2013-05-14: qty 2

## 2013-05-14 MED ORDER — MIDAZOLAM HCL 5 MG/5ML IJ SOLN
INTRAMUSCULAR | Status: DC | PRN
  Administered 2013-05-14 (×2): 1 mg via INTRAVENOUS

## 2013-05-14 MED ORDER — ONDANSETRON HCL 4 MG/2ML IJ SOLN: 4.0000 mg | Freq: Once | INTRAMUSCULAR | Status: DC | PRN

## 2013-05-14 MED ORDER — PROPOFOL INFUSION 10 MG/ML OPTIME
INTRAVENOUS | Status: DC | PRN
Start: 2013-05-14 — End: 2013-05-14
  Administered 2013-05-14: 50 ug/kg/min via INTRAVENOUS

## 2013-05-14 MED ORDER — THROMBIN 20000 UNITS EX SOLR
CUTANEOUS | Status: AC
  Filled 2013-05-14: qty 20000

## 2013-05-14 MED ORDER — LIDOCAINE HCL (CARDIAC) 20 MG/ML IV SOLN
INTRAVENOUS | Status: DC | PRN
  Administered 2013-05-14: 60 mg via INTRAVENOUS

## 2013-05-14 MED ORDER — SODIUM CHLORIDE 0.9 % IV SOLN
INTRAVENOUS | Status: DC | PRN
  Administered 2013-05-14: 07:00:00 via INTRAVENOUS

## 2013-05-14 MED ORDER — HEPARIN SODIUM (PORCINE) 5000 UNIT/ML IJ SOLN
INTRAMUSCULAR | Status: DC | PRN
  Administered 2013-05-14: 08:00:00

## 2013-05-14 MED ORDER — EPHEDRINE SULFATE 50 MG/ML IJ SOLN
INTRAMUSCULAR | Status: DC | PRN
  Administered 2013-05-14 (×2): 5 mg via INTRAVENOUS

## 2013-05-14 MED ORDER — HYDROMORPHONE HCL PF 1 MG/ML IJ SOLN: 0.2500 mg | INTRAMUSCULAR | Status: DC | PRN

## 2013-05-14 MED ORDER — LIDOCAINE-EPINEPHRINE (PF) 1 %-1:200000 IJ SOLN
INTRAMUSCULAR | Status: DC | PRN
  Administered 2013-05-14: 2 mL

## 2013-05-14 MED ORDER — 0.9 % SODIUM CHLORIDE (POUR BTL) OPTIME
TOPICAL | Status: DC | PRN
  Administered 2013-05-14: 1000 mL

## 2013-05-14 MED ORDER — SODIUM CHLORIDE 0.9 % IV SOLN: INTRAVENOUS | Status: DC

## 2013-05-14 MED ORDER — PROPOFOL 10 MG/ML IV BOLUS
INTRAVENOUS | Status: DC | PRN
  Administered 2013-05-14: 20 mg via INTRAVENOUS

## 2013-05-14 MED ORDER — PROPOFOL 10 MG/ML IV BOLUS
INTRAVENOUS | Status: AC
  Filled 2013-05-14: qty 20

## 2013-05-14 MED ORDER — BUPIVACAINE HCL (PF) 0.5 % IJ SOLN
INTRAMUSCULAR | Status: DC | PRN
  Administered 2013-05-14: 2 mL

## 2013-05-14 MED ORDER — FENTANYL CITRATE 0.05 MG/ML IJ SOLN
INTRAMUSCULAR | Status: AC
  Filled 2013-05-14: qty 5

## 2013-05-14 MED ORDER — HYDROCODONE-ACETAMINOPHEN 10-325 MG PO TABS: 1.0000 | ORAL_TABLET | ORAL | Status: DC | PRN

## 2013-05-14 MED ORDER — FENTANYL CITRATE 0.05 MG/ML IJ SOLN
INTRAMUSCULAR | Status: DC | PRN
  Administered 2013-05-14: 50 ug via INTRAVENOUS
  Administered 2013-05-14 (×2): 25 ug via INTRAVENOUS

## 2013-05-14 SURGICAL SUPPLY — 37 items
ADH SKN CLS APL DERMABOND .7 (GAUZE/BANDAGES/DRESSINGS) ×1
ARMBAND PINK RESTRICT EXTREMIT (MISCELLANEOUS) ×2 IMPLANT
BLADE 10 SAFETY STRL DISP (BLADE) ×2 IMPLANT
CANISTER SUCTION 2500CC (MISCELLANEOUS) ×2 IMPLANT
CLIP TI MEDIUM 6 (CLIP) ×2 IMPLANT
CLIP TI WIDE RED SMALL 6 (CLIP) ×2 IMPLANT
COVER PROBE W GEL 5X96 (DRAPES) ×1 IMPLANT
COVER SURGICAL LIGHT HANDLE (MISCELLANEOUS) ×2 IMPLANT
DECANTER SPIKE VIAL GLASS SM (MISCELLANEOUS) ×2 IMPLANT
DERMABOND ADVANCED (GAUZE/BANDAGES/DRESSINGS) ×1
DERMABOND ADVANCED .7 DNX12 (GAUZE/BANDAGES/DRESSINGS) ×1 IMPLANT
ELECT REM PT RETURN 9FT ADLT (ELECTROSURGICAL) ×2
ELECTRODE REM PT RTRN 9FT ADLT (ELECTROSURGICAL) ×1 IMPLANT
GLOVE BIO SURGEON STRL SZ7 (GLOVE) ×2 IMPLANT
GLOVE BIOGEL PI IND STRL 6.5 (GLOVE) IMPLANT
GLOVE BIOGEL PI IND STRL 7.5 (GLOVE) ×1 IMPLANT
GLOVE BIOGEL PI IND STRL 8 (GLOVE) IMPLANT
GLOVE BIOGEL PI INDICATOR 6.5 (GLOVE) ×1
GLOVE BIOGEL PI INDICATOR 7.5 (GLOVE) ×1
GLOVE BIOGEL PI INDICATOR 8 (GLOVE) ×1
GOWN STRL REUS W/ TWL LRG LVL3 (GOWN DISPOSABLE) ×3 IMPLANT
GOWN STRL REUS W/TWL LRG LVL3 (GOWN DISPOSABLE) ×6
KIT BASIN OR (CUSTOM PROCEDURE TRAY) ×2 IMPLANT
KIT ROOM TURNOVER OR (KITS) ×2 IMPLANT
NS IRRIG 1000ML POUR BTL (IV SOLUTION) ×2 IMPLANT
PACK CV ACCESS (CUSTOM PROCEDURE TRAY) ×2 IMPLANT
PAD ARMBOARD 7.5X6 YLW CONV (MISCELLANEOUS) ×4 IMPLANT
SPONGE SURGIFOAM ABS GEL 100 (HEMOSTASIS) IMPLANT
SUT MNCRL AB 4-0 PS2 18 (SUTURE) ×2 IMPLANT
SUT PROLENE 6 0 BV (SUTURE) ×1 IMPLANT
SUT PROLENE 7 0 BV 1 (SUTURE) ×2 IMPLANT
SUT VIC AB 3-0 SH 27 (SUTURE) ×2
SUT VIC AB 3-0 SH 27X BRD (SUTURE) ×1 IMPLANT
TOWEL OR 17X24 6PK STRL BLUE (TOWEL DISPOSABLE) ×2 IMPLANT
TOWEL OR 17X26 10 PK STRL BLUE (TOWEL DISPOSABLE) ×2 IMPLANT
UNDERPAD 30X30 INCONTINENT (UNDERPADS AND DIAPERS) ×2 IMPLANT
WATER STERILE IRR 1000ML POUR (IV SOLUTION) ×2 IMPLANT

## 2013-05-14 NOTE — Anesthesia Postprocedure Evaluation (Signed)
  Anesthesia Post-op Note  Patient: Jon [name]  Procedure(s) Performed: Procedure(s): LEFT BRACHIOCEPHALIC ARTERIOVENOUS (AV) FISTULA CREATION (Left)  Patient Location: PACU  Anesthesia Type:General  Level of Consciousness: awake, alert , oriented and patient cooperative  Airway and Oxygen Therapy: Patient Spontanous Breathing  Post-op Pain: mild  Post-op Assessment: Post-op Vital signs reviewed, Patient's Cardiovascular Status Stable, Respiratory Function Stable, Patent Airway, No signs of Nausea or vomiting and Pain level controlled  Post-op Vital Signs: stable  Last Vitals:  Filed Vitals:   05/14/13 0913  BP: 128/66  Pulse: 56  Temp:   Resp: 13    Complications: No apparent anesthesia complications

## 2013-05-14 NOTE — Anesthesia Preprocedure Evaluation (Addendum)
Anesthesia Evaluation  Patient identified by MRN, date of birth, ID band Patient awake    Reviewed: Allergy & Precautions, H&P , NPO status , Patient's Chart, lab work & pertinent test results  Airway Mallampati: II TM Distance: <3 FB Neck ROM: Full    Dental  (+) Edentulous Upper, Edentulous Lower, Dental Advisory Given   Pulmonary sleep apnea and Continuous Positive Airway Pressure Ventilation , former smoker,          Cardiovascular hypertension, Pt. on medications + CAD     Neuro/Psych CVA, Residual Symptoms    GI/Hepatic hiatal hernia, GERD-  ,  Endo/Other  diabetes, Type 2, Insulin Dependent  Renal/GU Dialysis, ESRF and CRFRenal disease     Musculoskeletal   Abdominal   Peds  Hematology   Anesthesia Other Findings Pt. States has tingling in R arm and R leg from CVA  Reproductive/Obstetrics                        Anesthesia Physical Anesthesia Plan  ASA: III  Anesthesia Plan: MAC   Post-op Pain Management:    Induction: Intravenous  Airway Management Planned: Simple Face Mask  Additional Equipment:   Intra-op Plan:   Post-operative Plan:   Informed Consent: I have reviewed the patients History and Physical, chart, labs and discussed the procedure including the risks, benefits and alternatives for the proposed anesthesia with the patient or authorized representative who has indicated his/her understanding and acceptance.     Plan Discussed with:   Anesthesia Plan Comments:         Anesthesia Quick Evaluation

## 2013-05-14 NOTE — Op Note (Signed)
OPERATIVE NOTE   PROCEDURE: left brachiocephalic arteriovenous fistula placement  PRE-OPERATIVE DIAGNOSIS: chronic kidney disease stage V   POST-OPERATIVE DIAGNOSIS: same as above   SURGEON: Adele Barthel, MD  ASSISTANT(S): Gerri Lins, PAC   ANESTHESIA: local and MAC  ESTIMATED BLOOD LOSS: 30 cc  FINDING(S): Palpable thrill and radial pulse at end of case  SPECIMEN(S):  none  INDICATIONS:   Jon [name] is a 63 y.o. male who presents with chronic kidney disease stage V.  His previous left radiocephalic arteriovenous fistula   had thrombosed so he was scheduled for left brachiocephalic arteriovenous fistula.  The patient is scheduled for left brachiocephalic arteriovenous fistula placement.  The patient is aware the risks include but are not limited to: bleeding, infection, steal syndrome, nerve damage, ischemic monomelic neuropathy, failure to mature, and need for additional procedures.  The patient is aware of the risks of the procedure and elects to proceed forward.  DESCRIPTION: After full informed written consent was obtained from the patient, the patient was brought back to the operating room and placed supine upon the operating table.  Prior to induction, the patient received IV antibiotics.   After obtaining adequate anesthesia, the patient was then prepped and draped in the standard fashion for a left arm access procedure.  I turned my attention first to identifying the patient's cephalic vein and brachial artery.  Using SonoSite guidance, the location of these vessels were marked out on the skin.   At this point, I injected local anesthetic to obtain a field block of the antecubitum.  In total, I injected about 5 mL of a 1:1 mixture of 0.5% Marcaine without epinephrine and 1% lidocaine with epinephrine.  I made a tranverse incision at the level of the antecubitum and dissected through the subcutaneous tissue and fascia to gain exposure of the brachial artery.  This was  noted to be 4-5 mm in diameter externally.  This was dissected out proximally and distally and controlled with vessel loops .  I then dissected out the cephalic vein.  This was noted to be 3 mm in diameter externally.  The distal segment of the vein was ligated with a  2-0 silk, and the vein was transected.  The proximal segment was iinterrogated with serial dilators.  The vein accepted up to a 3 mm dilator without any difficulty.  I then instilled the heparinized saline into the vein and clamped it.  At this point, I reset my exposure of the brachial artery and placed the artery under tension proximally and distally.  I made an arteriotomy with a #11 blade, and then I extended the arteriotomy with a Potts scissor.  I injected heparinized saline proximal and distal to this arteriotomy.  The vein was then sewn to the artery in an end-to-side configuration with a running stitch of 7-0 Prolene.  Prior to completing this anastomosis, I allowed the vein and artery to backbleed.  There was no evidence of clot from any vessels.  I completed the anastomosis in the usual fashion and then released all vessel loops and clamps.  There was a palpable thrill in the venous outflow, and there was a palpable radial pulse.  At this point, I irrigated out the surgical wound.  There was no further active bleeding.  The subcutaneous tissue was reapproximated with a running stitch of 3-0 Vicryl.  The skin was then reapproximated with a running subcuticular stitch of 4-0 Vicryl.  The skin was then cleaned, dried, and reinforced with  Dermabond.  The patient tolerated this procedure well.   COMPLICATIONS: none  CONDITION: stable  Adele Barthel, MD [facility] Office: [phone] Pager: [phone]  05/14/2013, 8:34 AM

## 2013-05-14 NOTE — H&P (Signed)
Brief History and Physical  History of Present Illness  Jon [name] is a 63 y.o. male who presents with chief complaint: thrombosed L RC AVF.  The patient presents today for placement of L BC AVF.    Past Medical History  Diagnosis Date  . Hyperlipidemia     takes Lovastatin daily  . Chronic back pain     radiculopathy and stenosis  . History of colon polyps   . H/O hiatal hernia   . Urinary frequency   . Peripheral edema     takes Lasix daily  . Nocturia   . CVA (cerebral infarction) 03/2013  . Stroke     speech, rt arm weakness  . Sleep apnea     cpap , study in their home, 09/2102- [employer]           . Hypertension     takes Amlodipine and Catapres daily    dr Forrestine Him, Loop Recorder - Thompson Grayer  . Diabetes mellitus      Lantus nightly ;type 2  . Chronic kidney disease     [name], awaiting Transplant   . GERD (gastroesophageal reflux disease)   . Arthritis     back, hands     Past Surgical History  Procedure Laterality Date  . Right leg surgery      pin in place  . Neuroplasty / transposition median nerve at carpal tunnel bilateral    . Right wrist surgery    . Left knee surgery    . Hemorrhoid surgery    . Colonoscopy    . Esophagogastroduodenoscopy    . Loop recorder implant  03/19/13    MDT LinQ implanted by Dr Rayann Heman for cryptogenic stroke  . Tee without cardioversion N/A 03/19/2013    Procedure: TRANSESOPHAGEAL ECHOCARDIOGRAM (TEE);  Surgeon: [name], MD;  Location: [facility];  Service: Cardiovascular;  Laterality: N/A;  . Av fistula placement Left 04/03/2013    Procedure: ARTERIOVENOUS (AV) FISTULA CREATION- LEFT RADIOCEPHALIC VS BRACHIOCEPHALIC;  Surgeon: Conrad Spade, MD;  Location: [facility];  Service: Vascular;  Laterality: Left;  . Back surgery  12/2012    2003- 1st back surgery & then 2014- fusion    History   Social History  . Marital Status: Married    Spouse Name: N/A    Number of Children: N/A  . Years of Education:  N/A   Occupational History  . Not on file.   Social History Main Topics  . Smoking status: Former Smoker -- 0.50 packs/day for 40 years    Types: Cigarettes    Quit date: 12/27/2012  . Smokeless tobacco: Never Used  . Alcohol Use: No  . Drug Use: No  . Sexual Activity: Yes   Other Topics Concern  . Not on file   Social History Narrative  . No narrative on file    Family History  Problem Relation Age of Onset  . Hypertension Mother   . Hyperlipidemia Father   . Hypertension Father   . Heart disease Father     before age 45  . Diabetes Brother   . Hyperlipidemia Son     No current facility-administered medications on file prior to encounter.   Current Outpatient Prescriptions on File Prior to Encounter  Medication Sig Dispense Refill  . amLODipine (NORVASC) 10 MG tablet Take 10 mg by mouth daily after breakfast.       . aspirin EC 325 MG EC tablet Take 1 tablet (325 mg  total) by mouth daily.  30 tablet  0  . calcitRIOL (ROCALTROL) 0.25 MCG capsule Take 0.25 mcg by mouth daily.      . cloNIDine (CATAPRES) 0.3 MG tablet Take 0.3 mg by mouth 3 (three) times daily.      . furosemide (LASIX) 80 MG tablet Take 80-160 mg by mouth 2 (two) times daily. 177m every am and 869min the pm      . HYDROcodone-acetaminophen (NORCO) 10-325 MG per tablet Take 1 tablet by mouth every 4 (four) hours as needed for moderate pain.  30 tablet  0  . insulin glargine (LANTUS) 100 UNIT/ML injection Inject 30 Units into the skin at bedtime.      . lovastatin (MEVACOR) 40 MG tablet Take 40 mg by mouth at bedtime.      . tamsulosin (FLOMAX) 0.4 MG CAPS capsule Take 0.4 mg by mouth daily after breakfast.      . Blood Glucose Monitoring Suppl (ONE TOUCH ULTRA MINI) W/DEVICE KIT       . cyclobenzaprine (FLEXERIL) 10 MG tablet Take 10 mg by mouth 3 (three) times daily as needed for muscle spasms.      . ONE TOUCH ULTRA TEST test strip       . ONETOUCH DELICA LANCETS [id number]         Allergies    Allergen Reactions  . Lisinopril Other (See Comments)    Increased potassium level   . Meloxicam Nausea And Vomiting  . Victoza [Liraglutide] Diarrhea, Nausea And Vomiting and Swelling    Review of Systems: As listed above, otherwise negative.  Physical Examination  Filed Vitals:   05/14/13 0545  BP: 126/68  Pulse: 49  Temp: 97.6 F (36.4 C)  TempSrc: Oral  Resp: 20  Weight: 231 lb (104.781 kg)  SpO2: 99%    General: A&O x 3, WDWN  Pulmonary: Sym exp, good air movt, CTAB, no rales, rhonchi, & wheezing   Cardiac: RRR, Nl S1, S2, no Murmurs, rubs or gallops  Gastrointestinal: soft, NTND, -G/R, - HSM, - masses, - CVAT B  Musculoskeletal: M/S 5/5 throughout , Extremities without ischemic changes , L wrist incision nearly healed, no thrill or bruit  Laboratory See iStat  Medical Decision Making  Jon [name] a [age].o. male who presents with: chronic kidney disease stage V .   The patient is scheduled for: L BC AVF  Risk, benefits, and alternatives to access surgery were discussed.  The patient is aware the risks include but are not limited to: bleeding, infection, steal syndrome, nerve damage, ischemic monomelic neuropathy, failure to mature, and need for additional procedures.  The patient is aware of the risks and agrees to proceed.  Jon [name] [facility]: [phone]: [phone]/05/2013, 7:07 AM

## 2013-05-14 NOTE — Transfer of Care (Signed)
Immediate Anesthesia Transfer of Care Note  Patient: Jon [name]  Procedure(s) Performed: Procedure(s): LEFT BRACHIOCEPHALIC ARTERIOVENOUS (AV) FISTULA CREATION (Left)  Patient Location: PACU  Anesthesia Type:MAC  Level of Consciousness: lethargic and responds to stimulation  Airway & Oxygen Therapy: Patient Spontanous Breathing and Patient connected to face mask  Post-op Assessment: Report given to PACU RN and Post -op Vital signs reviewed and stable  Post vital signs: Reviewed and stable  Complications: No apparent anesthesia complications

## 2013-05-14 NOTE — Telephone Encounter (Addendum)
Message copied by Gena Fray on Tue May 14, 2013  2:59 PM ------      Message from: Mena Goes      Created: Tue May 14, 2013  8:46 AM      Regarding: Schedule       Dr. Bridgett Larsson said for patient to come back in 6 weeks.                     ----- Message -----         From: Ulyses Amor, PA-C         Sent: 05/14/2013   8:39 AM           To: Vvs Charge Pool            F/U in 4 weeks with Dr. Bridgett Larsson av fistula creation.       ------  05/14/13: spoke with pts wife, dpm

## 2013-05-16 ENCOUNTER — Ambulatory Visit (HOSPITAL_COMMUNITY): Payer: Medicare Other | Attending: Cardiovascular Disease | Admitting: Radiology

## 2013-05-16 VITALS — BP 104/52 | Ht 71.0 in | Wt 230.0 lb

## 2013-05-16 DIAGNOSIS — I639 Cerebral infarction, unspecified: Secondary | ICD-10-CM

## 2013-05-16 DIAGNOSIS — I6350 Cerebral infarction due to unspecified occlusion or stenosis of unspecified cerebral artery: Secondary | ICD-10-CM | POA: Insufficient documentation

## 2013-05-16 DIAGNOSIS — R0989 Other specified symptoms and signs involving the circulatory and respiratory systems: Secondary | ICD-10-CM

## 2013-05-16 DIAGNOSIS — R0609 Other forms of dyspnea: Secondary | ICD-10-CM

## 2013-05-16 DIAGNOSIS — N184 Chronic kidney disease, stage 4 (severe): Secondary | ICD-10-CM

## 2013-05-16 DIAGNOSIS — Z01810 Encounter for preprocedural cardiovascular examination: Secondary | ICD-10-CM

## 2013-05-16 MED ORDER — TECHNETIUM TC 99M SESTAMIBI GENERIC - CARDIOLITE
11.0000 | Freq: Once | INTRAVENOUS | Status: AC | PRN
  Administered 2013-05-16: 11 via INTRAVENOUS

## 2013-05-16 MED ORDER — TECHNETIUM TC 99M SESTAMIBI GENERIC - CARDIOLITE
33.0000 | Freq: Once | INTRAVENOUS | Status: AC | PRN
  Administered 2013-05-16: 33 via INTRAVENOUS

## 2013-05-16 MED ORDER — REGADENOSON 0.4 MG/5ML IV SOLN
0.4000 mg | Freq: Once | INTRAVENOUS | Status: AC
  Administered 2013-05-16: 0.4 mg via INTRAVENOUS

## 2013-05-16 NOTE — Progress Notes (Signed)
[facility] SITE [address] NUCLEAR MED [address] [place], [place] [address] [phone]    Cardiology Nuclear Med Study  Jon [name] is a 63 y.o. male     MRN : [id number]     DOB: 09/01/1950  Procedure Date: 05/16/2013  Nuclear Med Background Indication for Stress Test:  Evaluation for Ischemia and Surgical Clearance- Renal Transplant- [facility] History:  3/15 ECHO: EF: 55-60% Loop Recorder, CKD Stage IV Cardiac Risk Factors: Carotid Disease, CVA, Family History - CAD, History of Smoking, Hypertension, IDDM Type 2 and Lipids  Symptoms:  DOE   Nuclear Pre-Procedure Caffeine/Decaff Intake:  None NPO After: 9:30pm   Lungs:  clear O2 Sat: 94% on room air. IV 0.9% NS with Angio Cath:  22g  IV Site: R Hand  IV Started by:  Crissie Figures, RN  Chest Size (in):  48 Cup Size: n/a  Height: 5\' 11"  (1.803 m)  Weight:  230 lb (104.327 kg)  BMI:  Body mass index is 32.09 kg/(m^2). Tech Comments:  CBG @ 6:30 am= 148    Nuclear Med Study 1 or 2 day study: 1 day  Stress Test Type:  Lexiscan  Reading MD: N/A  Order Authorizing Provider:  Thompson Grayer, MD  Resting Radionuclide: Technetium 54m Sestamibi  Resting Radionuclide Dose: 11.0 mCi   Stress Radionuclide:  Technetium 42m Sestamibi  Stress Radionuclide Dose: 33.0 mCi           Stress Protocol Rest HR: 56 Stress HR: 87  Rest BP: 104/52 Stress BP: 131/53  Exercise Time (min): n/a METS: n/a   Predicted Max HR: 159 bpm % Max HR: 54.72 bpm Rate Pressure Product: 11397   Dose of Adenosine (mg):  n/a Dose of Lexiscan: 0.4 mg  Dose of Atropine (mg): n/a Dose of Dobutamine: n/a mcg/kg/min (at max HR)  Stress Test Technologist: Perrin Maltese, EMT-P  Nuclear Technologist:  Charlton Amor, CNMT     Rest Procedure:  Myocardial perfusion imaging was performed at rest 45 minutes following the intravenous administration of Technetium 20m Sestamibi. Rest ECG: NSR - Normal EKG  Stress Procedure:  The patient received IV Lexiscan 0.4  mg over 15-seconds.  Technetium 71m Sestamibi injected at 30-seconds. This patient had sob and was lt. Headed with the Lexiscan injection. Quantitative spect images were obtained after a 45 minute delay. Stress ECG: No significant change from baseline ECG  QPS Raw Data Images:  Normal; no motion artifact; normal heart/lung ratio. Stress Images:  Normal homogeneous uptake in all areas of the myocardium. Rest Images:  Normal homogeneous uptake in all areas of the myocardium. Subtraction (SDS):  No evidence of ischemia. Transient Ischemic Dilatation (Normal <1.22):  1.14 Lung/Heart Ratio (Normal <0.45):  0.30  Quantitative Gated Spect Images QGS EDV:  113 ml QGS ESV:  53 ml  Impression Exercise Capacity:  Lexiscan with no exercise. BP Response:  Normal blood pressure response. Clinical Symptoms:  No significant symptoms noted. ECG Impression:  No significant ECG changes with Lexiscan. Comparison with Prior Nuclear Study: No previous nuclear study performed  Overall Impression:  Normal stress nuclear study.  LV Ejection Fraction: 53%.  LV Wall Motion:  NL LV Function; NL Wall Motion  Sanda Klein, MD, [facility] [phone] office [phone] pager

## 2013-05-17 ENCOUNTER — Other Ambulatory Visit (HOSPITAL_COMMUNITY): Payer: Medicare Other

## 2013-05-17 ENCOUNTER — Encounter: Payer: Medicare Other | Admitting: Vascular Surgery

## 2013-05-17 ENCOUNTER — Encounter (HOSPITAL_COMMUNITY): Payer: Self-pay | Admitting: Vascular Surgery

## 2013-05-20 ENCOUNTER — Telehealth: Payer: Self-pay | Admitting: *Deleted

## 2013-05-20 ENCOUNTER — Ambulatory Visit (INDEPENDENT_AMBULATORY_CARE_PROVIDER_SITE_OTHER): Payer: Medicare Other | Admitting: *Deleted

## 2013-05-20 ENCOUNTER — Encounter: Payer: Self-pay | Admitting: Physician Assistant

## 2013-05-20 DIAGNOSIS — I6350 Cerebral infarction due to unspecified occlusion or stenosis of unspecified cerebral artery: Secondary | ICD-10-CM

## 2013-05-20 DIAGNOSIS — I639 Cerebral infarction, unspecified: Secondary | ICD-10-CM

## 2013-05-20 NOTE — Telephone Encounter (Signed)
Message copied by Michae Kava on Mon May 20, 2013  8:45 AM ------      Message from: Arial, [place] T      Created: Mon May 20, 2013  5:22 AM       Please inform patient stress test normal.      Please fax copy of stress test to transplant surgeon at [facility], PA-C        5:21 AM       05/20/2013 ------

## 2013-05-20 NOTE — Telephone Encounter (Signed)
ptcb has been notified about normal myoview results. He did not know the name of the surgeon at [facility], so I will call [facility] to where to fax for pt. I will also mail pt a copy as well.

## 2013-05-20 NOTE — Telephone Encounter (Signed)
lmptcb for myoview results. I will fax results to surgeon today at [facility].

## 2013-05-30 ENCOUNTER — Ambulatory Visit (INDEPENDENT_AMBULATORY_CARE_PROVIDER_SITE_OTHER): Payer: Medicare Other | Admitting: Neurology

## 2013-05-30 ENCOUNTER — Encounter: Payer: Self-pay | Admitting: Neurology

## 2013-05-30 VITALS — BP 138/66 | HR 58 | Ht 71.0 in | Wt 238.0 lb

## 2013-05-30 DIAGNOSIS — I6340 Cerebral infarction due to embolism of unspecified cerebral artery: Secondary | ICD-10-CM

## 2013-05-30 DIAGNOSIS — I639 Cerebral infarction, unspecified: Secondary | ICD-10-CM | POA: Insufficient documentation

## 2013-05-30 NOTE — Patient Instructions (Signed)
I had a long discussion with the patient regarding his recent stroke, risk factors and secondary prevention strategies and answered questions. Recommend continue aspirin for stroke prevention and strict control of diabetes with hemoglobin A1c goal below 6.5%, lipids with LDL cholesterol goal below 70 mg percent and hypertension with blood pressure goal below 130/90. Continue to use CPAP every night for sleep apnea. I also encouraged him to diet, exercise regularly and lose weight. Return for followup in 3 months with Charlott Holler, NP or call earlier if necessary  Stroke Prevention Some medical conditions and behaviors are associated with an increased chance of having a stroke. You may prevent a stroke by making healthy choices and managing medical conditions. HOW CAN I REDUCE MY RISK OF HAVING A STROKE?   Stay physically active. Get at least 30 minutes of activity on most or all days.  Do not smoke. It may also be helpful to avoid exposure to secondhand smoke.  Limit alcohol use. Moderate alcohol use is considered to be:  No more than 2 drinks per day for men.  No more than 1 drink per day for nonpregnant women.  Eat healthy foods. This involves  Eating 5 or more servings of fruits and vegetables a day.  Following a diet that addresses high blood pressure (hypertension), high cholesterol, diabetes, or obesity.  Manage your cholesterol levels.  A diet low in saturated fat, trans fat, and cholesterol and high in fiber may control cholesterol levels.  Take any prescribed medicines to control cholesterol as directed by your health care provider.  Manage your diabetes.  A controlled-carbohydrate, controlled-sugar diet is recommended to manage diabetes.  Take any prescribed medicines to control diabetes as directed by your health care provider.  Control your hypertension.  A low-salt (sodium), low-saturated fat, low-trans fat, and low-cholesterol diet is recommended to manage  hypertension.  Take any prescribed medicines to control hypertension as directed by your health care provider.  Maintain a healthy weight.  A reduced-calorie, low-sodium, low-saturated fat, low-trans fat, low-cholesterol diet is recommended to manage weight.  Stop drug abuse.  Avoid taking birth control pills.  Talk to your health care provider about the risks of taking birth control pills if you are over 41 years old, smoke, get migraines, or have ever had a blood clot.  Get evaluated for sleep disorders (sleep apnea).  Talk to your health care provider about getting a sleep evaluation if you snore a lot or have excessive sleepiness.  Take medicines as directed by your health care provider.  For some people, aspirin or blood thinners (anticoagulants) are helpful in reducing the risk of forming abnormal blood clots that can lead to stroke. If you have the irregular heart rhythm of atrial fibrillation, you should be on a blood thinner unless there is a good reason you cannot take them.  Understand all your medicine instructions.  Make sure that other other conditions (such as anemia or atherosclerosis) are addressed. SEEK IMMEDIATE MEDICAL CARE IF:   You have sudden weakness or numbness of the face, arm, or leg, especially on one side of the body.  Your face or eyelid droops to one side.  You have sudden confusion.  You have trouble speaking (aphasia) or understanding.  You have sudden trouble seeing in one or both eyes.  You have sudden trouble walking.  You have dizziness.  You have a loss of balance or coordination.  You have a sudden, severe headache with no known cause.  You have new chest pain  or an irregular heartbeat. Any of these symptoms may represent a serious problem that is an emergency. Do not wait to see if the symptoms will go away. Get medical help at once. Call your local emergency services  (911 in [place] Do not drive yourself to the hospital. Document  Released: 02/04/2004 Document Revised: 10/17/2012 Document Reviewed: 06/29/2012 [facility] Patient Information 2014 [place].

## 2013-05-30 NOTE — Progress Notes (Signed)
[facility] [address] [place]. [place] [address] [phone]       OFFICE FOLLOW-UP NOTE  [name] Date of Birth:  01/22/1950 Medical Record Number:  [id number]   HPI: 67 year Caucasian male seen for first office followup visit for hospital admission for stroke in March 2015. He was admitted on 03/15/13 with sudden onset of right-sided numbness and difficulty finding words while eating dinner. He was evaluated at [facility] and was given IV t-PA and transferred to [facility]. He had several episodes of right arm shaking and was started on IV Keppra for this. MRI scan confirmed left MCA small cortical infarct an MRA of the brain showed no large vessel stenosis. Carotid ultrasound showed no extracranial stenosis. Transthoracic echo showed normal ejection fraction. TEE showed no cardiac source of embolism or PFO. Telemetry monitoring did not reveal cardiac arrhythmias. EEG showed no evidence of seizures. LDL cholesterol was 62 patient was on statin pattern admission. Hemoglobin A1c was 7.0. Patient who had stage IV kidney disease and is currently under evaluation for renal transplant. He states his done well sensation she speech is completely back to normal only occasionally he [name] a few words. He had loop recorder implanted by a cardiologist and so far has not been found to have a true fibrillation over 2 months. His back to work and has no limitations. He plans to exercise and lose weight. He has no complaints today.  ROS:   14 system review of systems is positive for fatigue, increased thirst, cramps, too much sleep and decreased energy only and all other systems negative PMH:  Past Medical History  Diagnosis Date  . Hyperlipidemia     takes Lovastatin daily  . Chronic back pain     radiculopathy and stenosis  . History of colon polyps   . H/O hiatal hernia   . Urinary frequency   . Peripheral edema     takes Lasix daily  . Nocturia     . CVA (cerebral infarction) 03/2013  . Stroke     speech, rt arm weakness  . Sleep apnea     cpap , study in their home, 09/2102- [employer]           . Hypertension     takes Amlodipine and Catapres daily    dr Forrestine Him, Loop Recorder - Thompson Grayer  . Diabetes mellitus      Lantus nightly ;type 2  . Chronic kidney disease     [name], awaiting Transplant   . GERD (gastroesophageal reflux disease)   . Arthritis     back, hands   . Hx of cardiovascular stress test     a.  Lexiscan Myoview (05/2013):  No ischemia, EF 53%; Normal Study    Social History:  History   Social History  . Marital Status: Married    Spouse Name: agnes    Number of Children: 5  . Years of Education: 8th   Occupational History  . disabled    Social History Main Topics  . Smoking status: Former Smoker -- 0.50 packs/day for 40 years    Types: Cigarettes    Quit date: 12/27/2012  . Smokeless tobacco: Never Used  . Alcohol Use: No  . Drug Use: No  . Sexual Activity: Yes   Other Topics Concern  . Not on file   Social History Narrative   Patient lives at home with his wife, drinks coffe, soda's daily  Medications:   Current Outpatient Prescriptions on File Prior to Visit  Medication Sig Dispense Refill  . amLODipine (NORVASC) 10 MG tablet Take 10 mg by mouth daily after breakfast.       . aspirin EC 325 MG EC tablet Take 1 tablet (325 mg total) by mouth daily.  30 tablet  0  . Blood Glucose Monitoring Suppl (ONE TOUCH ULTRA MINI) W/DEVICE KIT       . calcitRIOL (ROCALTROL) 0.25 MCG capsule Take 0.25 mcg by mouth daily.      . calcium carbonate (TUMS - DOSED IN MG ELEMENTAL CALCIUM) 500 MG chewable tablet Chew 1 tablet by mouth 3 (three) times daily with meals.      . cloNIDine (CATAPRES) 0.3 MG tablet Take 0.3 mg by mouth 3 (three) times daily.      . cyclobenzaprine (FLEXERIL) 10 MG tablet Take 10 mg by mouth 3 (three) times daily as needed for muscle spasms.      . furosemide (LASIX) 80 MG  tablet Take 80-160 mg by mouth 2 (two) times daily. 160mg every am and 80mg in the pm      . HYDROcodone-acetaminophen (NORCO) 10-325 MG per tablet Take 1 tablet by mouth every 4 (four) hours as needed for moderate pain.  30 tablet  0  . insulin glargine (LANTUS) 100 UNIT/ML injection Inject 30 Units into the skin at bedtime.      . lovastatin (MEVACOR) 40 MG tablet Take 40 mg by mouth at bedtime.      . ONE TOUCH ULTRA TEST test strip       . ONETOUCH DELICA LANCETS 33G MISC       . tamsulosin (FLOMAX) 0.4 MG CAPS capsule Take 0.4 mg by mouth daily after breakfast.       No current facility-administered medications on file prior to visit.    Allergies:   Allergies  Allergen Reactions  . Lisinopril Other (See Comments)    Increased potassium level   . Meloxicam Nausea And Vomiting  . Victoza [Liraglutide] Diarrhea, Nausea And Vomiting and Swelling    Physical Exam General: Obese middle-age Caucasian male, seated, in no evident distress Head: head normocephalic and atraumatic. Orohparynx benign Neck: supple with no carotid or supraclavicular bruits Cardiovascular: regular rate and rhythm, no murmurs Musculoskeletal: no deformity Skin:  no rash/petichiae Vascular:  Normal pulses all extremities Filed Vitals:   05/30/13 1421  BP: 138/66  Pulse: 58   Neurologic Exam Mental Status: Awake and fully alert. Oriented to place and time. Recent and remote memory intact. Attention span, concentration and fund of knowledge appropriate. Mood and affect appropriate.  Cranial Nerves: Fundoscopic exam reveals sharp disc margins. Pupils equal, briskly reactive to light. Extraocular movements full without nystagmus. Visual fields full to confrontation. Hearing intact. Facial sensation intact. Face, tongue, palate moves normally and symmetrically.  Motor: Normal bulk and tone. Normal strength in all tested extremity muscles. Diminished fine finger movements on the right. Orbits left over right upper  extremity. Minimum weakness of right grip. Sensory.: intact to touch and pinprick and vibratory sensation.  Coordination: Rapid alternating movements normal in all extremities. Finger-to-nose and heel-to-shin performed accurately bilaterally. Gait and Station: Arises from chair without difficulty. Stance is normal. Gait demonstrates normal stride length and balance . Able to heel, toe and tandem walk without difficulty.  Reflexes: 1+ and symmetric. Toes downgoing.   NIHSS  0 Modified Rankin  1   ASSESSMENT: 62 year Caucsian male with left MCA branch embolic infarct   in March 2015 of cryptogenic etiology treated with IV TPA with excellent clinical recovery. Multiple vascular risk factors of hypertension, hyperlipidemia, diabetes, obstructive sleep apnea and obesity.    PLAN: I had a long discussion with the patient regarding his recent stroke, risk factors and secondary prevention strategies and answered questions. Recommend continue aspirin for stroke prevention and strict control of diabetes with hemoglobin A1c goal below 6.5%, lipids with LDL cholesterol goal below 70 mg percent and hypertension with blood pressure goal below 130/90. Continue to use CPAP every night for sleep apnea. I also encouraged him to diet, exercise regularly and lose weight. Return for followup in 3 months with Lynn Lam, NP or call earlier if necessary  S   Note: This document was prepared with digital dictation and possible smart phrase technology. Any transcriptional errors that result from this process are unintentional  

## 2013-06-20 ENCOUNTER — Encounter: Payer: Self-pay | Admitting: Vascular Surgery

## 2013-06-20 ENCOUNTER — Ambulatory Visit (INDEPENDENT_AMBULATORY_CARE_PROVIDER_SITE_OTHER): Payer: Medicare Other | Admitting: *Deleted

## 2013-06-20 DIAGNOSIS — I6350 Cerebral infarction due to unspecified occlusion or stenosis of unspecified cerebral artery: Secondary | ICD-10-CM

## 2013-06-20 DIAGNOSIS — I639 Cerebral infarction, unspecified: Secondary | ICD-10-CM

## 2013-06-21 ENCOUNTER — Ambulatory Visit (HOSPITAL_COMMUNITY)
Admission: RE | Admit: 2013-06-21 | Discharge: 2013-06-21 | Disposition: A | Discharge status: Home / Self Care | Payer: Medicare Other | Source: Ambulatory Visit | Attending: Vascular Surgery | Admitting: Vascular Surgery

## 2013-06-21 ENCOUNTER — Encounter: Payer: Self-pay | Admitting: Vascular Surgery

## 2013-06-21 ENCOUNTER — Ambulatory Visit (INDEPENDENT_AMBULATORY_CARE_PROVIDER_SITE_OTHER): Payer: Self-pay | Admitting: Vascular Surgery

## 2013-06-21 VITALS — BP 133/64 | HR 79 | Resp 18 | Ht 71.0 in | Wt 240.0 lb

## 2013-06-21 DIAGNOSIS — N186 End stage renal disease: Secondary | ICD-10-CM | POA: Insufficient documentation

## 2013-06-21 DIAGNOSIS — N184 Chronic kidney disease, stage 4 (severe): Secondary | ICD-10-CM

## 2013-06-21 DIAGNOSIS — Z4931 Encounter for adequacy testing for hemodialysis: Secondary | ICD-10-CM

## 2013-06-21 NOTE — Progress Notes (Signed)
   [facility] [place]  Subjective  - This 63 y/o male is here for a post-op left brachiocephalic arteriovenous fistula placement 4 weeks out.  He has no complaints.  He is not yet on dialysis.     Objective 133/64 79   18   PE: Palpable thrill along the full length of the fistula Incision has healed well N/V/M intact left upper extremity  Dialysis duplex 06/21/2013 Depth is 0.57 and less Diameter 0.37 to 0.52    Assessment/Planning: 4 weeks s/p PROCEDURE:  left brachiocephalic arteriovenous fistula placement  The fistula is maturing well at this point since he is only 4 weeks post-op we will continue to observe it.    At the next visit in 4 weeks we will decide if the competeting side branches need to be legated or not.   The patient was seen today by Dr. Bridgett Larsson in clinic.  Laurence Slate [facility] 06/21/2013 2:08 PM --  Addendum  I have independently interviewed and examined the patient, and I agree with the physician assistant's findings.  Re-evaluation in 4 weeks.  If not mature, might consider ligating side branches.  Adele Barthel, MD [facility] [place] Office: [phone] Pager: [phone]  06/21/2013, 3:35 PM

## 2013-07-01 LAB — MDC_IDC_ENUM_SESS_TYPE_REMOTE
MDC IDC SESS DTM: 20150611094026
Zone Setting Detection Interval: 2000 ms
Zone Setting Detection Interval: 3000 ms
Zone Setting Detection Interval: 360 ms

## 2013-07-08 ENCOUNTER — Ambulatory Visit (INDEPENDENT_AMBULATORY_CARE_PROVIDER_SITE_OTHER): Payer: Medicare Other | Admitting: Internal Medicine

## 2013-07-08 ENCOUNTER — Encounter: Payer: Self-pay | Admitting: Internal Medicine

## 2013-07-08 VITALS — BP 140/67 | HR 68 | Ht 71.0 in | Wt 236.0 lb

## 2013-07-08 DIAGNOSIS — G4733 Obstructive sleep apnea (adult) (pediatric): Secondary | ICD-10-CM

## 2013-07-08 LAB — MDC_IDC_ENUM_SESS_TYPE_REMOTE

## 2013-07-08 LAB — MDC_IDC_ENUM_SESS_TYPE_INCLINIC
Date Time Interrogation Session: 20150629112457
MDC IDC SET ZONE DETECTION INTERVAL: 2000 ms
MDC IDC SET ZONE DETECTION INTERVAL: 3000 ms
Zone Setting Detection Interval: 360 ms

## 2013-07-08 NOTE — Progress Notes (Signed)
PCP: REDDING Angelique Blonder., MD  Jon [name] is a 63 y.o. male who presents today for cardiology followup after recent preoperative clearance eval with Richardson Dopp.  He is doing reasonably well.  Today, he denies symptoms of palpitations, chest pain, shortness of breath,  lower extremity edema, dizziness, presyncope, or syncope.  He has daytime somnolence but continues to work to get CPAP settings right.  He also is on flexeril and narco which likely contribute.  He has ESRD but has not yet started dialysis.  The patient is otherwise without complaint today.   Past Medical History  Diagnosis Date  . Hyperlipidemia     takes Lovastatin daily  . Chronic back pain     radiculopathy and stenosis  . History of colon polyps   . H/O hiatal hernia   . Urinary frequency   . Peripheral edema     takes Lasix daily  . Nocturia   . CVA (cerebral infarction) 03/2013  . Stroke     speech, rt arm weakness  . Sleep apnea     cpap , study in their home, 09/2102- [employer]           . Hypertension     takes Amlodipine and Catapres daily    dr Forrestine Him, Loop Recorder - [facility]  . Diabetes mellitus      Lantus nightly ;type 2  . Chronic kidney disease     [name], awaiting Transplant   . GERD (gastroesophageal reflux disease)   . Arthritis     back, hands   . Hx of cardiovascular stress test     a.  Lexiscan Myoview (05/2013):  No ischemia, EF 53%; Normal Study   Past Surgical History  Procedure Laterality Date  . Right leg surgery      pin in place  . Neuroplasty / transposition median nerve at carpal tunnel bilateral    . Right wrist surgery    . Left knee surgery    . Hemorrhoid surgery    . Colonoscopy    . Esophagogastroduodenoscopy    . Loop recorder implant  03/19/13    MDT LinQ implanted by Dr Rayann Heman for cryptogenic stroke  . Tee without cardioversion N/A 03/19/2013    Procedure: TRANSESOPHAGEAL ECHOCARDIOGRAM (TEE);  Surgeon: [name], MD;  Location: [facility];   Service: Cardiovascular;  Laterality: N/A;  . Av fistula placement Left 04/03/2013    Procedure: ARTERIOVENOUS (AV) FISTULA CREATION- LEFT RADIOCEPHALIC VS BRACHIOCEPHALIC;  Surgeon: Conrad Woodbine, MD;  Location: [facility];  Service: Vascular;  Laterality: Left;  . Back surgery  12/2012    2003- 1st back surgery & then 2014- fusion  . Av fistula placement Left 05/14/2013    Procedure: LEFT BRACHIOCEPHALIC ARTERIOVENOUS (AV) FISTULA CREATION;  Surgeon: Conrad [place], MD;  Location: [facility];  Service: Vascular;  Laterality: Left;    Current Outpatient Prescriptions  Medication Sig Dispense Refill  . amLODipine (NORVASC) 10 MG tablet Take 10 mg by mouth daily after breakfast.       . aspirin EC 325 MG EC tablet Take 1 tablet (325 mg total) by mouth daily.  30 tablet  0  . calcitRIOL (ROCALTROL) 0.25 MCG capsule Take 0.25 mcg by mouth daily.      . calcium carbonate (TUMS - DOSED IN MG ELEMENTAL CALCIUM) 500 MG chewable tablet Chew 1 tablet by mouth 3 (three) times daily with meals.      . cloNIDine (CATAPRES) 0.3 MG tablet Take 0.3 mg  by mouth 3 (three) times daily.      . cyclobenzaprine (FLEXERIL) 10 MG tablet Take 10 mg by mouth 3 (three) times daily as needed for muscle spasms.      . furosemide (LASIX) 80 MG tablet Take 80-160 mg by mouth 2 (two) times daily. 160mg  every am and 80mg  in the pm      . HYDROcodone-acetaminophen (NORCO) 10-325 MG per tablet Take 1 tablet by mouth every 4 (four) hours as needed for moderate pain.  30 tablet  0  . LANTUS SOLOSTAR 100 UNIT/ML Solostar Pen Inject 30 Units into the skin at bedtime.       . lovastatin (MEVACOR) 40 MG tablet Take 40 mg by mouth at bedtime.      . tamsulosin (FLOMAX) 0.4 MG CAPS capsule Take 0.4 mg by mouth daily after breakfast.       No current facility-administered medications for this visit.   ROS- all systems reviewed and negative except as per above ILR reviewed- no afib detected  Physical Exam: Filed Vitals:   07/08/13 0925  BP:  140/67  Pulse: 68  Height: 5\' 11"  (1.803 m)  Weight: 236 lb (107.049 kg)    GEN- The patient is overweight appearing, alert and oriented x 3 today.   Head- normocephalic, atraumatic Eyes-  Sclera clear, conjunctiva pink Ears- hearing intact Oropharynx- clear Lungs- Clear to ausculation bilaterally, normal work of breathing Heart- Regular rate and rhythm, no murmurs, rubs or gallops, PMI not laterally displaced GI- soft, NT, ND, + BS Extremities- no clubbing, cyanosis, or edema  Assessment and Plan:  1. cryptogenic stroke No afib detected by ILR Will continue to monitor remotely  2. Preoperative clearance Recent myoview is low risk No further CV workup planned  I will follow remotely going forward He will contact me if problems arise

## 2013-07-08 NOTE — Patient Instructions (Signed)
Your physician recommends that you schedule a follow-up appointment as needed with Dr Allred   

## 2013-07-18 ENCOUNTER — Encounter: Payer: Self-pay | Admitting: Vascular Surgery

## 2013-07-19 ENCOUNTER — Encounter: Payer: Self-pay | Admitting: Vascular Surgery

## 2013-07-19 ENCOUNTER — Ambulatory Visit (INDEPENDENT_AMBULATORY_CARE_PROVIDER_SITE_OTHER): Payer: Medicare Other | Admitting: Vascular Surgery

## 2013-07-19 VITALS — BP 144/56 | HR 57 | Ht 71.0 in | Wt 236.7 lb

## 2013-07-19 DIAGNOSIS — N186 End stage renal disease: Secondary | ICD-10-CM | POA: Insufficient documentation

## 2013-07-19 NOTE — Progress Notes (Signed)
    Postoperative Access Visit   History of Present Illness  Jon [name] is a 63 y.o. year old male who presents for postoperative follow-up for: L BC AVF  (Date: 05/14/13).  The patient's wounds are healed.  The patient notes no steal symptoms.  The patient is able to complete their activities of daily living.  The patient's current symptoms are: none.  For VQI Use Only  PRE-ADM LIVING: Home  AMB STATUS: Ambulatory  Physical Examination Filed Vitals:   07/19/13 0845  BP: 144/56  Pulse: 57    LUE: Incision is healed, skin feels warm, hand grip is 5/5, sensation in digits is intact, palpable thrill, bruit can be auscultated , Fistula 5.2-5.5 mm on sonosite, few smaller side branches are noteable  Medical Decision Making  Jon [name] is a 63 y.o. year old male who presents s/p L BC AVF.  Would wait another 1-2 months.  I suspect this fistula will be mature in that time period.  He will follow up in 2 months  Thank you for allowing [place] to participate in this patient's care.  Adele Barthel, MD [facility] [place] Office: [phone] Pager: [phone]  07/19/2013, 8:55 AM

## 2013-07-22 ENCOUNTER — Encounter: Payer: Self-pay | Admitting: Internal Medicine

## 2013-07-22 ENCOUNTER — Ambulatory Visit (INDEPENDENT_AMBULATORY_CARE_PROVIDER_SITE_OTHER): Payer: Medicare Other | Admitting: *Deleted

## 2013-07-22 DIAGNOSIS — I639 Cerebral infarction, unspecified: Secondary | ICD-10-CM

## 2013-07-22 DIAGNOSIS — I6350 Cerebral infarction due to unspecified occlusion or stenosis of unspecified cerebral artery: Secondary | ICD-10-CM

## 2013-07-23 LAB — MDC_IDC_ENUM_SESS_TYPE_REMOTE
Date Time Interrogation Session: 20150712030125
Zone Setting Detection Interval: 2000 ms
Zone Setting Detection Interval: 3000 ms
Zone Setting Detection Interval: 360 ms

## 2013-07-26 ENCOUNTER — Encounter: Payer: Self-pay | Admitting: Internal Medicine

## 2013-07-29 NOTE — Progress Notes (Signed)
Loop recorder 

## 2013-08-20 ENCOUNTER — Ambulatory Visit (INDEPENDENT_AMBULATORY_CARE_PROVIDER_SITE_OTHER): Payer: Medicare Other | Admitting: *Deleted

## 2013-08-20 DIAGNOSIS — I639 Cerebral infarction, unspecified: Secondary | ICD-10-CM

## 2013-08-20 DIAGNOSIS — I6350 Cerebral infarction due to unspecified occlusion or stenosis of unspecified cerebral artery: Secondary | ICD-10-CM

## 2013-08-20 LAB — MDC_IDC_ENUM_SESS_TYPE_REMOTE

## 2013-08-26 NOTE — Progress Notes (Signed)
Loop recorder 

## 2013-08-29 ENCOUNTER — Telehealth: Payer: Self-pay | Admitting: Nurse Practitioner

## 2013-08-29 NOTE — Telephone Encounter (Signed)
Called pt and left message informing him that the provider will not be in the office and that we needed to cancelled his appt and he needed to call back to resch.

## 2013-08-30 ENCOUNTER — Ambulatory Visit: Payer: Medicare Other | Admitting: Nurse Practitioner

## 2013-09-12 ENCOUNTER — Ambulatory Visit (INDEPENDENT_AMBULATORY_CARE_PROVIDER_SITE_OTHER): Payer: Medicare Other | Admitting: Nurse Practitioner

## 2013-09-12 ENCOUNTER — Encounter: Payer: Self-pay | Admitting: Nurse Practitioner

## 2013-09-12 VITALS — BP 123/66 | HR 72 | Ht 71.0 in | Wt 231.8 lb

## 2013-09-12 DIAGNOSIS — I639 Cerebral infarction, unspecified: Secondary | ICD-10-CM

## 2013-09-12 DIAGNOSIS — I6340 Cerebral infarction due to embolism of unspecified cerebral artery: Secondary | ICD-10-CM

## 2013-09-12 NOTE — Patient Instructions (Signed)
Recommend continue aspirin for stroke prevention and strict control of diabetes with hemoglobin A1c goal below 6.5%, lipids with LDL cholesterol goal below 70 mg percent and hypertension with blood pressure goal below 130/90. Continue to use CPAP every night for sleep apnea. I also encouraged him to diet, exercise regularly and lose weight. Return for followup in 6 months with me or call earlier if necessary.  Stroke Prevention Some medical conditions and behaviors are associated with an increased chance of having a stroke. You may prevent a stroke by making healthy choices and managing medical conditions. HOW CAN I REDUCE MY RISK OF HAVING A STROKE?   Stay physically active. Get at least 30 minutes of activity on most or all days.  Do not smoke. It may also be helpful to avoid exposure to secondhand smoke.  Limit alcohol use. Moderate alcohol use is considered to be:  No more than 2 drinks per day for men.  No more than 1 drink per day for nonpregnant women.  Eat healthy foods. This involves:  Eating 5 or more servings of fruits and vegetables a day.  Making dietary changes that address high blood pressure (hypertension), high cholesterol, diabetes, or obesity.  Manage your cholesterol levels.  Making food choices that are high in fiber and low in saturated fat, trans fat, and cholesterol may control cholesterol levels.  Take any prescribed medicines to control cholesterol as directed by your health care provider.  Manage your diabetes.  Controlling your carbohydrate and sugar intake is recommended to manage diabetes.  Take any prescribed medicines to control diabetes as directed by your health care provider.  Control your hypertension.  Making food choices that are low in salt (sodium), saturated fat, trans fat, and cholesterol is recommended to manage hypertension.  Take any prescribed medicines to control hypertension as directed by your health care provider.  Maintain a  healthy weight.  Reducing calorie intake and making food choices that are low in sodium, saturated fat, trans fat, and cholesterol are recommended to manage weight.  Stop drug abuse.  Avoid taking birth control pills.  Talk to your health care provider about the risks of taking birth control pills if you are over 68 years old, smoke, get migraines, or have ever had a blood clot.  Get evaluated for sleep disorders (sleep apnea).  Talk to your health care provider about getting a sleep evaluation if you snore a lot or have excessive sleepiness.  Take medicines only as directed by your health care provider.  For some people, aspirin or blood thinners (anticoagulants) are helpful in reducing the risk of forming abnormal blood clots that can lead to stroke. If you have the irregular heart rhythm of atrial fibrillation, you should be on a blood thinner unless there is a good reason you cannot take them.  Understand all your medicine instructions.  Make sure that other conditions (such as anemia or atherosclerosis) are addressed. SEEK IMMEDIATE MEDICAL CARE IF:   You have sudden weakness or numbness of the face, arm, or leg, especially on one side of the body.  Your face or eyelid droops to one side.  You have sudden confusion.  You have trouble speaking (aphasia) or understanding.  You have sudden trouble seeing in one or both eyes.  You have sudden trouble walking.  You have dizziness.  You have a loss of balance or coordination.  You have a sudden, severe headache with no known cause.  You have new chest pain or an irregular heartbeat.  Any of these symptoms may represent a serious problem that is an emergency. Do not wait to see if the symptoms will go away. Get medical help at once. Call your local emergency services (911 in [place] Do not drive yourself to the hospital. Document Released: 02/04/2004 Document Revised: 05/13/2013 Document Reviewed: 06/29/2012 [facility] Patient  Information 2015 [place], [place]. This information is not intended to replace advice given to you by your health care provider. Make sure you discuss any questions you have with your health care provider.

## 2013-09-12 NOTE — Progress Notes (Signed)
PATIENT: Jon [name] DOB: 07-23-1950  REASON FOR VISIT: routine follow up for stroke HISTORY FROM: patient  HISTORY OF PRESENT ILLNESS: 34 year Caucasian male seen for first office followup visit for hospital admission for stroke in March 2015. He was admitted on 03/15/13 with sudden onset of right-sided numbness and difficulty finding words while eating dinner. He was evaluated at [facility] and was given IV t-PA and transferred to [facility]. He had several episodes of right arm shaking and was started on IV Keppra for this. MRI scan confirmed left MCA small cortical infarct an MRA of the brain showed no large vessel stenosis. Carotid ultrasound showed no extracranial stenosis. Transthoracic echo showed normal ejection fraction. TEE showed no cardiac source of embolism or PFO. Telemetry monitoring did not reveal cardiac arrhythmias. EEG showed no evidence of seizures. LDL cholesterol was 62 patient was on statin pattern admission. Hemoglobin A1c was 7.0. Patient who had stage IV kidney disease and is currently under evaluation for renal transplant. He states his done well sensation she speech is completely back to normal only occasionally he Jon [name] a few words. He had loop recorder implanted by a cardiologist and so far has not been found to have a true fibrillation over 2 months. His back to work and has no limitations. He plans to exercise and lose weight. He has no complaints today.   Update 09/12/13 (LL): Jon [name] returns to the office today for stroke followup he states that he has been well. He is found a kidney donor which is his younger sister that will be donating a kidney and transplant surgery will take place in 19 days at [facility].  He had a left upper arm AV fistula placed by Dr. Bridgett Larsson which is maturing but he is not had to start hemodialysis yet.  He has no residual deficits from his stroke. His blood pressure is been well  controlled is 123/66 in office today. He states he wears a CPAP every night and feels better doing so. His Lantus was recently increased by 3 units each night and his blood sugars are much better usually averaging in the 120s fasting. He is tolerating daily aspirin 325 mg well without significant bruising or bleeding. He has no new complaints today.  ROS:  14 system review of systems is positive for fatigue, activity change, sleep apnea only and all other systems negative   ALLERGIES: Allergies  Allergen Reactions  . Lisinopril Other (See Comments)    Increased potassium level   . Meloxicam Nausea And Vomiting  . Victoza [Liraglutide] Diarrhea, Nausea And Vomiting and Swelling    HOME MEDICATIONS: Outpatient Prescriptions Prior to Visit  Medication Sig Dispense Refill  . amLODipine (NORVASC) 10 MG tablet Take 10 mg by mouth daily after breakfast.       . aspirin EC 325 MG EC tablet Take 1 tablet (325 mg total) by mouth daily.  30 tablet  0  . calcitRIOL (ROCALTROL) 0.25 MCG capsule Take 0.25 mcg by mouth daily.      . calcium carbonate (TUMS - DOSED IN MG ELEMENTAL CALCIUM) 500 MG chewable tablet Chew 1 tablet by mouth 3 (three) times daily with meals.      . cloNIDine (CATAPRES) 0.3 MG tablet Take 0.3 mg by mouth 3 (three) times daily.      . cyclobenzaprine (FLEXERIL) 10 MG tablet Take 10 mg by mouth 3 (three) times daily as needed for muscle spasms.      [name]  furosemide (LASIX) 80 MG tablet Take 80-160 mg by mouth 2 (two) times daily. 160mg  every am and 80mg  in the pm      . HYDROcodone-acetaminophen (NORCO) 10-325 MG per tablet Take 1 tablet by mouth every 4 (four) hours as needed for moderate pain.  30 tablet  0  . LANTUS SOLOSTAR 100 UNIT/ML Solostar Pen Inject 33 Units into the skin at bedtime.       . lovastatin (MEVACOR) 40 MG tablet Take 40 mg by mouth at bedtime.      . ONE TOUCH ULTRA TEST test strip       . tamsulosin (FLOMAX) 0.4 MG CAPS capsule Take 0.4 mg by mouth daily  after breakfast.      . GAVILYTE-N WITH FLAVOR PACK 420 G solution        No facility-administered medications prior to visit.    PHYSICAL EXAM Filed Vitals:   09/12/13 1031  BP: 123/66  Pulse: 72  Height: 5\' 11"  (1.803 m)  Weight: 231 lb 12.8 oz (105.144 kg)   Body mass index is 32.34 kg/(m^2).   Physical Exam  General: Obese middle-age Caucasian male, seated, in no evident distress  Head: head normocephalic and atraumatic. Orohparynx benign  Neck: supple with no carotid or supraclavicular bruits  Cardiovascular: regular rate and rhythm, no murmurs  Musculoskeletal: no deformity  Skin: no rash/petichiae  Vascular: Normal pulses all extremities   Neurologic Exam  Mental Status: Awake and fully alert. Oriented to place and time. Recent and remote memory intact. Attention span, concentration and fund of knowledge appropriate. Mood and affect appropriate.  Cranial Nerves: Fundoscopic exam reveals sharp disc margins. Pupils equal, briskly reactive to light. Extraocular movements full without nystagmus. Visual fields full to confrontation. Hearing intact. Facial sensation intact. Face, tongue, palate moves normally and symmetrically.  Motor: Normal bulk and tone. Normal strength in all tested extremity muscles. Diminished fine finger movements on the right. Orbits left over right upper extremity. Minimum weakness of right grip.  Sensory.: intact to touch and pinprick and vibratory sensation.  Coordination: Rapid alternating movements normal in all extremities. Finger-to-nose and heel-to-shin performed accurately bilaterally.  Gait and Station: Arises from chair without difficulty. Stance is normal. Gait demonstrates normal stride length and balance . Able to heel, toe and tandem walk without difficulty.  Reflexes: 1+ and symmetric. Toes downgoing.  NIHSS 0  Modified Rankin 1     ASSESSMENT: 56 year Jon [name] male with left MCA branch embolic infarct in March 2015 of cryptogenic  etiology treated with IV TPA with excellent clinical recovery. Multiple vascular risk factors of hypertension, hyperlipidemia, diabetes, obstructive sleep apnea and obesity.   PLAN:  I had a long discussion with the patient regarding his recent stroke, risk factors and secondary prevention strategies and answered questions. Recommend continue aspirin for stroke prevention and strict control of diabetes with hemoglobin A1c goal below 6.5%, lipids with LDL cholesterol goal below 70 mg percent and hypertension with blood pressure goal below 130/90. Continue to use CPAP every night for sleep apnea. I also encouraged him to diet, exercise regularly and lose weight. Return for followup in 6 months with me or call earlier if necessary.  Rudi Rummage Ginette Bradway, MSN, FNP-BC, A/GNP-C 09/12/2013, 10:50 AM [facility] [address] [place], [place] [address] [phone]  Note: This document was prepared with digital dictation and possible smart phrase technology. Any transcriptional errors that result from this process are unintentional.

## 2013-09-13 ENCOUNTER — Encounter: Payer: Self-pay | Admitting: Nurse Practitioner

## 2013-09-13 ENCOUNTER — Ambulatory Visit: Payer: Medicare Other | Admitting: Nurse Practitioner

## 2013-09-15 NOTE — Progress Notes (Signed)
I agree with the above plan 

## 2013-09-19 ENCOUNTER — Encounter: Payer: Self-pay | Admitting: Vascular Surgery

## 2013-09-20 ENCOUNTER — Ambulatory Visit (INDEPENDENT_AMBULATORY_CARE_PROVIDER_SITE_OTHER): Payer: Self-pay | Admitting: Vascular Surgery

## 2013-09-20 ENCOUNTER — Encounter: Payer: Self-pay | Admitting: Vascular Surgery

## 2013-09-20 VITALS — BP 126/60 | HR 57 | Ht 71.0 in | Wt 235.9 lb

## 2013-09-20 DIAGNOSIS — N186 End stage renal disease: Secondary | ICD-10-CM

## 2013-09-20 NOTE — Progress Notes (Signed)
    Postoperative Access Visit   History of Present Illness  Jon [name] is a 63 y.o. year old male who presents for postoperative follow-up for: L BC AVF (Date: 05/14/13).  The patient's wounds are healed.  The patient notes no steal symptoms.  The patient is able to complete their activities of daily living.  The patient's current symptoms are: none.  Pt is already scheduled for a Nephrectomy and LRKT.  For VQI Use Only  PRE-ADM LIVING: Home  AMB STATUS: Ambulatory  Physical Examination Filed Vitals:   09/20/13 0832  BP: 126/60  Pulse: 57    LUE: Incision is healed, skin feels warm, hand grip is 5/5, sensation in digits is intact, palpable thrill, bruit can be auscultated , on Sonosite: distal segment 5 mm with proximal >6 mm  Medical Decision Making  Jon [name] is a 63 y.o. year old male who presents s/p L BC AVF.  Though the distal segment is smaller, it's also readily palpable.  The proximal segment is already matured and easily palpated aso.  The patient's access is ready for use.   Though it sounds as if he will never need it due to the upcoming LRKT.  The patient's tunneled dialysis catheter can be removed after two successful cannulations and completed dialysis treatments.  Thank you for allowing [place] to participate in this patient's care.  Adele Barthel, MD [facility] Office: [phone] Pager: [phone]  09/20/2013, 8:52 AM

## 2013-09-24 ENCOUNTER — Ambulatory Visit: Payer: Medicare Other | Admitting: Nurse Practitioner

## 2013-10-01 DIAGNOSIS — G8918 Other acute postprocedural pain: Secondary | ICD-10-CM

## 2013-10-01 HISTORY — DX: Other acute postprocedural pain: G89.18

## 2013-10-01 HISTORY — PX: KIDNEY TRANSPLANT: SHX239

## 2013-10-02 ENCOUNTER — Encounter: Payer: Self-pay | Admitting: Internal Medicine

## 2013-10-14 DIAGNOSIS — D849 Immunodeficiency, unspecified: Secondary | ICD-10-CM

## 2013-10-14 DIAGNOSIS — D899 Disorder involving the immune mechanism, unspecified: Secondary | ICD-10-CM

## 2013-10-14 HISTORY — DX: Immunodeficiency, unspecified: D84.9

## 2013-10-18 ENCOUNTER — Ambulatory Visit (INDEPENDENT_AMBULATORY_CARE_PROVIDER_SITE_OTHER): Payer: Medicare Other | Admitting: *Deleted

## 2013-10-18 DIAGNOSIS — I639 Cerebral infarction, unspecified: Secondary | ICD-10-CM

## 2013-10-21 DIAGNOSIS — Z79899 Other long term (current) drug therapy: Secondary | ICD-10-CM | POA: Insufficient documentation

## 2013-10-21 DIAGNOSIS — Z940 Kidney transplant status: Secondary | ICD-10-CM

## 2013-10-21 HISTORY — DX: Kidney transplant status: Z94.0

## 2013-10-21 HISTORY — DX: Other long term (current) drug therapy: Z79.899

## 2013-10-23 NOTE — Progress Notes (Signed)
Loop recorder 

## 2013-10-24 DIAGNOSIS — R7989 Other specified abnormal findings of blood chemistry: Secondary | ICD-10-CM

## 2013-10-24 HISTORY — DX: Other specified abnormal findings of blood chemistry: R79.89

## 2013-10-25 HISTORY — DX: Hypomagnesemia: E83.42

## 2013-11-05 LAB — MDC_IDC_ENUM_SESS_TYPE_REMOTE

## 2013-11-14 ENCOUNTER — Encounter: Payer: Self-pay | Admitting: Internal Medicine

## 2013-11-18 ENCOUNTER — Ambulatory Visit (INDEPENDENT_AMBULATORY_CARE_PROVIDER_SITE_OTHER): Payer: Medicare Other | Admitting: *Deleted

## 2013-11-18 DIAGNOSIS — I639 Cerebral infarction, unspecified: Secondary | ICD-10-CM

## 2013-11-19 LAB — MDC_IDC_ENUM_SESS_TYPE_REMOTE
MDC IDC SESS DTM: 20151101043952
MDC IDC SET ZONE DETECTION INTERVAL: 360 ms
Zone Setting Detection Interval: 2000 ms
Zone Setting Detection Interval: 3000 ms

## 2013-11-20 ENCOUNTER — Encounter: Payer: Self-pay | Admitting: Internal Medicine

## 2013-11-27 NOTE — Progress Notes (Signed)
Loop recorder 

## 2013-11-28 ENCOUNTER — Encounter: Payer: Self-pay | Admitting: Internal Medicine

## 2013-11-28 ENCOUNTER — Encounter: Payer: Self-pay | Admitting: Neurology

## 2013-12-18 ENCOUNTER — Ambulatory Visit (INDEPENDENT_AMBULATORY_CARE_PROVIDER_SITE_OTHER): Payer: Medicare Other | Admitting: *Deleted

## 2013-12-18 DIAGNOSIS — I639 Cerebral infarction, unspecified: Secondary | ICD-10-CM

## 2013-12-18 LAB — MDC_IDC_ENUM_SESS_TYPE_REMOTE

## 2013-12-19 ENCOUNTER — Encounter (HOSPITAL_COMMUNITY): Payer: Self-pay | Admitting: Internal Medicine

## 2013-12-20 NOTE — Progress Notes (Signed)
Loop recorder 

## 2014-01-13 ENCOUNTER — Encounter: Payer: Self-pay | Admitting: Internal Medicine

## 2014-01-17 ENCOUNTER — Ambulatory Visit (INDEPENDENT_AMBULATORY_CARE_PROVIDER_SITE_OTHER): Payer: Medicare Other | Admitting: *Deleted

## 2014-01-17 DIAGNOSIS — I639 Cerebral infarction, unspecified: Secondary | ICD-10-CM | POA: Diagnosis not present

## 2014-01-22 DIAGNOSIS — Z940 Kidney transplant status: Secondary | ICD-10-CM | POA: Diagnosis not present

## 2014-01-24 NOTE — Progress Notes (Signed)
Loop recorder 

## 2014-01-28 DIAGNOSIS — Z8673 Personal history of transient ischemic attack (TIA), and cerebral infarction without residual deficits: Secondary | ICD-10-CM | POA: Diagnosis not present

## 2014-01-28 DIAGNOSIS — N185 Chronic kidney disease, stage 5: Secondary | ICD-10-CM | POA: Diagnosis not present

## 2014-01-28 DIAGNOSIS — E785 Hyperlipidemia, unspecified: Secondary | ICD-10-CM | POA: Diagnosis not present

## 2014-01-28 DIAGNOSIS — Z7982 Long term (current) use of aspirin: Secondary | ICD-10-CM | POA: Diagnosis not present

## 2014-01-28 DIAGNOSIS — D8989 Other specified disorders involving the immune mechanism, not elsewhere classified: Secondary | ICD-10-CM | POA: Diagnosis not present

## 2014-01-28 DIAGNOSIS — D631 Anemia in chronic kidney disease: Secondary | ICD-10-CM | POA: Diagnosis not present

## 2014-01-28 DIAGNOSIS — I120 Hypertensive chronic kidney disease with stage 5 chronic kidney disease or end stage renal disease: Secondary | ICD-10-CM | POA: Diagnosis not present

## 2014-01-28 DIAGNOSIS — Z940 Kidney transplant status: Secondary | ICD-10-CM | POA: Diagnosis not present

## 2014-01-28 DIAGNOSIS — I10 Essential (primary) hypertension: Secondary | ICD-10-CM | POA: Diagnosis not present

## 2014-01-28 DIAGNOSIS — H109 Unspecified conjunctivitis: Secondary | ICD-10-CM | POA: Diagnosis not present

## 2014-01-28 DIAGNOSIS — Z794 Long term (current) use of insulin: Secondary | ICD-10-CM | POA: Diagnosis not present

## 2014-01-28 DIAGNOSIS — Z79899 Other long term (current) drug therapy: Secondary | ICD-10-CM | POA: Diagnosis not present

## 2014-01-28 DIAGNOSIS — Z4822 Encounter for aftercare following kidney transplant: Secondary | ICD-10-CM | POA: Diagnosis not present

## 2014-01-28 DIAGNOSIS — E119 Type 2 diabetes mellitus without complications: Secondary | ICD-10-CM

## 2014-01-28 HISTORY — DX: Type 2 diabetes mellitus without complications: E11.9

## 2014-02-07 DIAGNOSIS — Z882 Allergy status to sulfonamides status: Secondary | ICD-10-CM | POA: Diagnosis not present

## 2014-02-07 DIAGNOSIS — E785 Hyperlipidemia, unspecified: Secondary | ICD-10-CM | POA: Diagnosis not present

## 2014-02-07 DIAGNOSIS — Z940 Kidney transplant status: Secondary | ICD-10-CM | POA: Diagnosis not present

## 2014-02-07 DIAGNOSIS — I120 Hypertensive chronic kidney disease with stage 5 chronic kidney disease or end stage renal disease: Secondary | ICD-10-CM | POA: Diagnosis not present

## 2014-02-07 DIAGNOSIS — B349 Viral infection, unspecified: Secondary | ICD-10-CM | POA: Diagnosis not present

## 2014-02-07 DIAGNOSIS — Z7982 Long term (current) use of aspirin: Secondary | ICD-10-CM | POA: Diagnosis not present

## 2014-02-07 DIAGNOSIS — D649 Anemia, unspecified: Secondary | ICD-10-CM | POA: Diagnosis not present

## 2014-02-07 DIAGNOSIS — N185 Chronic kidney disease, stage 5: Secondary | ICD-10-CM | POA: Diagnosis not present

## 2014-02-07 DIAGNOSIS — H109 Unspecified conjunctivitis: Secondary | ICD-10-CM | POA: Diagnosis not present

## 2014-02-07 DIAGNOSIS — D899 Disorder involving the immune mechanism, unspecified: Secondary | ICD-10-CM | POA: Diagnosis not present

## 2014-02-07 DIAGNOSIS — Z8673 Personal history of transient ischemic attack (TIA), and cerebral infarction without residual deficits: Secondary | ICD-10-CM | POA: Diagnosis not present

## 2014-02-07 DIAGNOSIS — B348 Other viral infections of unspecified site: Secondary | ICD-10-CM

## 2014-02-07 DIAGNOSIS — E118 Type 2 diabetes mellitus with unspecified complications: Secondary | ICD-10-CM | POA: Diagnosis not present

## 2014-02-07 HISTORY — DX: Other viral infections of unspecified site: B34.8

## 2014-02-11 LAB — MDC_IDC_ENUM_SESS_TYPE_REMOTE

## 2014-02-17 ENCOUNTER — Ambulatory Visit (INDEPENDENT_AMBULATORY_CARE_PROVIDER_SITE_OTHER): Payer: Medicare Other | Admitting: *Deleted

## 2014-02-17 DIAGNOSIS — I639 Cerebral infarction, unspecified: Secondary | ICD-10-CM | POA: Diagnosis not present

## 2014-02-17 DIAGNOSIS — G4733 Obstructive sleep apnea (adult) (pediatric): Secondary | ICD-10-CM | POA: Diagnosis not present

## 2014-02-17 NOTE — Progress Notes (Signed)
Loop recorder 

## 2014-02-18 LAB — MDC_IDC_ENUM_SESS_TYPE_REMOTE

## 2014-02-19 DIAGNOSIS — Z0000 Encounter for general adult medical examination without abnormal findings: Secondary | ICD-10-CM | POA: Diagnosis not present

## 2014-02-19 DIAGNOSIS — Z79899 Other long term (current) drug therapy: Secondary | ICD-10-CM | POA: Diagnosis not present

## 2014-02-19 DIAGNOSIS — I10 Essential (primary) hypertension: Secondary | ICD-10-CM | POA: Diagnosis not present

## 2014-02-19 DIAGNOSIS — E780 Pure hypercholesterolemia: Secondary | ICD-10-CM | POA: Diagnosis not present

## 2014-02-19 DIAGNOSIS — R5381 Other malaise: Secondary | ICD-10-CM | POA: Diagnosis not present

## 2014-02-19 DIAGNOSIS — D509 Iron deficiency anemia, unspecified: Secondary | ICD-10-CM | POA: Diagnosis not present

## 2014-02-19 DIAGNOSIS — E1165 Type 2 diabetes mellitus with hyperglycemia: Secondary | ICD-10-CM | POA: Diagnosis not present

## 2014-02-21 DIAGNOSIS — Z940 Kidney transplant status: Secondary | ICD-10-CM | POA: Diagnosis not present

## 2014-03-04 ENCOUNTER — Encounter: Payer: Self-pay | Admitting: Internal Medicine

## 2014-03-07 DIAGNOSIS — Z940 Kidney transplant status: Secondary | ICD-10-CM | POA: Diagnosis not present

## 2014-03-07 DIAGNOSIS — Z79899 Other long term (current) drug therapy: Secondary | ICD-10-CM | POA: Diagnosis not present

## 2014-03-07 DIAGNOSIS — E1122 Type 2 diabetes mellitus with diabetic chronic kidney disease: Secondary | ICD-10-CM | POA: Diagnosis not present

## 2014-03-07 DIAGNOSIS — Z7952 Long term (current) use of systemic steroids: Secondary | ICD-10-CM | POA: Diagnosis not present

## 2014-03-07 DIAGNOSIS — Z794 Long term (current) use of insulin: Secondary | ICD-10-CM | POA: Diagnosis not present

## 2014-03-07 DIAGNOSIS — E785 Hyperlipidemia, unspecified: Secondary | ICD-10-CM | POA: Diagnosis not present

## 2014-03-07 DIAGNOSIS — R05 Cough: Secondary | ICD-10-CM | POA: Diagnosis not present

## 2014-03-07 DIAGNOSIS — J069 Acute upper respiratory infection, unspecified: Secondary | ICD-10-CM

## 2014-03-07 DIAGNOSIS — B349 Viral infection, unspecified: Secondary | ICD-10-CM | POA: Diagnosis not present

## 2014-03-07 DIAGNOSIS — I120 Hypertensive chronic kidney disease with stage 5 chronic kidney disease or end stage renal disease: Secondary | ICD-10-CM | POA: Diagnosis not present

## 2014-03-07 DIAGNOSIS — D8989 Other specified disorders involving the immune mechanism, not elsewhere classified: Secondary | ICD-10-CM | POA: Diagnosis not present

## 2014-03-07 DIAGNOSIS — N185 Chronic kidney disease, stage 5: Secondary | ICD-10-CM | POA: Diagnosis not present

## 2014-03-07 HISTORY — DX: Acute upper respiratory infection, unspecified: J06.9

## 2014-03-13 ENCOUNTER — Encounter: Payer: Self-pay | Admitting: Adult Health

## 2014-03-13 ENCOUNTER — Ambulatory Visit: Payer: Medicare Other | Admitting: Nurse Practitioner

## 2014-03-13 ENCOUNTER — Ambulatory Visit (INDEPENDENT_AMBULATORY_CARE_PROVIDER_SITE_OTHER): Payer: Medicare Other | Admitting: Adult Health

## 2014-03-13 VITALS — BP 150/74 | HR 81 | Ht 71.0 in | Wt 243.0 lb

## 2014-03-13 DIAGNOSIS — Z8673 Personal history of transient ischemic attack (TIA), and cerebral infarction without residual deficits: Secondary | ICD-10-CM | POA: Diagnosis not present

## 2014-03-13 NOTE — Patient Instructions (Addendum)
continue aspirin for stroke prevention and strict control of diabetes with hemoglobin A1c goal below 6.5%, lipids with LDL cholesterol goal below 70 mg percent and hypertension with blood pressure goal below 130/90.  Continue with healthy eating and light exercise. Continue to use CPAP every night for sleep apnea. If you have any stroke like symptoms please call 911 immediately.

## 2014-03-13 NOTE — Progress Notes (Signed)
PATIENT: Jon [name] DOB: [birth year]  REASON FOR VISIT: follow up- history of CVA HISTORY FROM: patient  HISTORY OF PRESENT ILLNESS: Jon [name] is a 64 year old male with a history of CVA in March 2015. He returns today for follow-up. He is currently taking aspirin and tolerating it well. Denies any significant bruising or bleeding. Patient states that his blood pressure has been well controlled. Today it is slightly elevated however the patient states at home his systolic blood pressure normally runs between 125-140. Patient continues with statin medication to control his cholesterol. He states that his M.D. told him that his recent cholesterol levels were normal. . Patient's diabetes has also been controlled. The patient is on Lantus at bedtime. He states that his fasting blood sugars normally run around 100. His hemoglobin A1c after his kidney transplant was 6.5%. After the stroke the patient did have some issues with his speech however this has resolved.  The patient continues to use CPAP every night for sleep apnea. He reports this has benefited his sleep. The patient did have a successful kidney transplant. His sister donated one of her kidneys. He states that he has been doing well. Denies any new neurological symptoms. No new medical issues since last seen.  HISTORY (SETHI): 29 year Caucasian male seen for first office followup visit for hospital admission for stroke in March 2015. He was admitted on 03/15/13 with sudden onset of right-sided numbness and difficulty finding words while eating dinner. He was evaluated at [facility] and was given IV t-PA and transferred to [facility]. He had several episodes of right arm shaking and was started on IV Keppra for this. MRI scan confirmed left MCA small cortical infarct an MRA of the brain showed no large vessel stenosis. Carotid ultrasound showed no extracranial stenosis. Transthoracic echo showed normal ejection fraction. TEE  showed no cardiac source of embolism or PFO. Telemetry monitoring did not reveal cardiac arrhythmias. EEG showed no evidence of seizures. LDL cholesterol was 62 patient was on statin pattern admission. Hemoglobin A1c was 7.0. Patient who had stage IV kidney disease and is currently under evaluation for renal transplant. He states his done well sensation she speech is completely back to normal only occasionally he Jon [name] a few words. He had loop recorder implanted by a cardiologist and so far has not been found to have a true fibrillation over 2 months. His back to work and has no limitations. He plans to exercise and lose weight. He has no complaints today.   Update 09/12/13 (LL): Jon [name] returns to the office today for stroke followup he states that he has been well. He is found a kidney donor which is his younger sister that will be donating a kidney and transplant surgery will take place in 19 days at [facility]. He had a left upper arm AV fistula placed by Dr. Bridgett Larsson which is maturing but he is not had to start hemodialysis yet. He has no residual deficits from his stroke. His blood pressure is been well controlled is 123/66 in office today. He states he wears a CPAP every night and feels better doing so. His Lantus was recently increased by 3 units each night and his blood sugars are much better usually averaging in the 120s fasting. He is tolerating daily aspirin 325 mg well without significant bruising or bleeding. He has no new complaints today.   REVIEW OF SYSTEMS: Out of a complete 14 system review of symptoms,  the patient complains only of the following symptoms, and all other reviewed systems are negative.  Cough  ALLERGIES: Allergies  Allergen Reactions  . Lisinopril Other (See Comments)    Increased potassium level   . Meloxicam Nausea And Vomiting  . Victoza [Liraglutide] Diarrhea, Nausea And Vomiting and Swelling    HOME MEDICATIONS: Outpatient  Prescriptions Prior to Visit  Medication Sig Dispense Refill  . amLODipine (NORVASC) 10 MG tablet Take 10 mg by mouth daily after breakfast.     . aspirin EC 325 MG EC tablet Take 1 tablet (325 mg total) by mouth daily. 30 tablet 0  . calcitRIOL (ROCALTROL) 0.25 MCG capsule Take 0.25 mcg by mouth daily.    . calcium carbonate (TUMS - DOSED IN MG ELEMENTAL CALCIUM) 500 MG chewable tablet Chew 1 tablet by mouth 3 (three) times daily with meals.    . cloNIDine (CATAPRES) 0.3 MG tablet Take 0.3 mg by mouth 3 (three) times daily.    . cyclobenzaprine (FLEXERIL) 10 MG tablet Take 10 mg by mouth 3 (three) times daily as needed for muscle spasms.    . furosemide (LASIX) 40 MG tablet     . furosemide (LASIX) 80 MG tablet Take 80-160 mg by mouth 2 (two) times daily. 160mg  every am and 80mg  in the pm    . HYDROcodone-acetaminophen (NORCO) 10-325 MG per tablet Take 1 tablet by mouth every 4 (four) hours as needed for moderate pain. 30 tablet 0  . LANTUS SOLOSTAR 100 UNIT/ML Solostar Pen Inject 33 Units into the skin at bedtime.     . lovastatin (MEVACOR) 40 MG tablet Take 40 mg by mouth at bedtime.    . ONE TOUCH ULTRA TEST test strip     . tamsulosin (FLOMAX) 0.4 MG CAPS capsule Take 0.4 mg by mouth daily after breakfast.     No facility-administered medications prior to visit.    PAST MEDICAL HISTORY: Past Medical History  Diagnosis Date  . Hyperlipidemia     takes Lovastatin daily  . Chronic back pain     radiculopathy and stenosis  . History of colon polyps   . H/O hiatal hernia   . Urinary frequency   . Peripheral edema     takes Lasix daily  . Nocturia   . CVA (cerebral infarction) 03/2013  . Stroke     speech, rt arm weakness  . Sleep apnea     cpap , study in their home, 09/2102- [employer]           . Hypertension     takes Amlodipine and Catapres daily    dr Forrestine Him, Loop Recorder - [facility]  . Diabetes mellitus      Lantus nightly ;type 2  . Chronic kidney disease      [name], awaiting Transplant   . GERD (gastroesophageal reflux disease)   . Arthritis     back, hands   . Hx of cardiovascular stress test     a.  Lexiscan Myoview (05/2013):  No ischemia, EF 53%; Normal Study    PAST SURGICAL HISTORY: Past Surgical History  Procedure Laterality Date  . Right leg surgery      pin in place  . Neuroplasty / transposition median nerve at carpal tunnel bilateral    . Right wrist surgery    . Left knee surgery    . Hemorrhoid surgery    . Colonoscopy    . Esophagogastroduodenoscopy    . Loop recorder implant  03/19/13  MDT LinQ implanted by Dr Rayann Heman for cryptogenic stroke  . Tee without cardioversion N/A 03/19/2013    Procedure: TRANSESOPHAGEAL ECHOCARDIOGRAM (TEE);  Surgeon: [name], MD;  Location: [facility];  Service: Cardiovascular;  Laterality: N/A;  . Av fistula placement Left 04/03/2013    Procedure: ARTERIOVENOUS (AV) FISTULA CREATION- LEFT RADIOCEPHALIC VS BRACHIOCEPHALIC;  Surgeon: Conrad Dolores, MD;  Location: [place];  Service: Vascular;  Laterality: Left;  . Back surgery  12/2012    2003- 1st back surgery & then 2014- fusion  . Av fistula placement Left 05/14/2013    Procedure: LEFT BRACHIOCEPHALIC ARTERIOVENOUS (AV) FISTULA CREATION;  Surgeon: Conrad Gladwin, MD;  Location: [place];  Service: Vascular;  Laterality: Left;  . Loop recorder implant N/A 03/19/2013    Procedure: LOOP RECORDER IMPLANT;  Surgeon: Coralyn Mark, MD;  Location: [facility];  Service: Cardiovascular;  Laterality: N/A;    FAMILY HISTORY: Family History  Problem Relation Age of Onset  . Hypertension Mother   . Hyperlipidemia Father   . Hypertension Father   . Heart disease Father     before age 38  . Diabetes Brother   . Hyperlipidemia Son     SOCIAL HISTORY: History   Social History  . Marital Status: Married    Spouse Name: agnes  . Number of Children: 5  . Years of Education: 8th   Occupational History  . disabled    Social History Main  Topics  . Smoking status: Former Smoker -- 0.50 packs/day for 40 years    Types: Cigarettes    Quit date: 12/27/2012  . Smokeless tobacco: Never Used  . Alcohol Use: No  . Drug Use: No  . Sexual Activity: Yes   Other Topics Concern  . Not on file   Social History Narrative   Patient lives at home with his wife, drinks coffe, soda's daily       PHYSICAL EXAM  Filed Vitals:   03/13/14 0817  BP: 150/74  Pulse: 81  Height: 5\' 11"  (1.803 m)  Weight: 243 lb (110.224 kg)   Body mass index is 33.91 kg/(m^2).  Generalized: Well developed, in no acute distress   Neurological examination  Mentation: Alert oriented to time, place, history taking. Follows all commands speech and language fluent Cranial nerve II-XII: Pupils were equal round reactive to light. Extraocular movements were full, visual field were full on confrontational test. Facial sensation and strength were normal. Uvula tongue midline. Head turning and shoulder shrug  were normal and symmetric. Motor: The motor testing reveals 5 over 5 strength of all 4 extremities. Good symmetric motor tone is noted throughout.  Sensory: Sensory testing is intact to soft touch on all 4 extremities. No evidence of extinction is noted.  Coordination: Cerebellar testing reveals good finger-nose-finger and heel-to-shin bilaterally.  Gait and station: Gait is normal. Tandem gait is normal. Romberg is negative. No drift is seen.  Reflexes: Deep tendon reflexes are symmetric and normal bilaterally.    DIAGNOSTIC DATA (LABS, IMAGING, TESTING) - I reviewed patient records, labs, notes, testing and imaging myself where available.  Lab Results  Component Value Date   WBC 7.5 03/16/2013   HGB 11.9* 05/14/2013   HCT 35.0* 05/14/2013   MCV 87.6 03/16/2013   PLT 115* 03/16/2013      Component Value Date/Time   NA 141 05/14/2013 0607   K 4.5 05/14/2013 0607   CL 110 03/16/2013 0302   CO2 20 03/16/2013 0302   GLUCOSE 155*  05/14/2013 0607     BUN 49* 03/16/2013 0302   CREATININE 4.71* 03/16/2013 0302   CALCIUM 8.4 03/16/2013 0302   PROT 6.7 03/16/2013 0302   ALBUMIN 3.3* 03/16/2013 0302   AST 9 03/16/2013 0302   ALT 11 03/16/2013 0302   ALKPHOS 62 03/16/2013 0302   BILITOT 0.2* 03/16/2013 0302   GFRNONAA 12* 03/16/2013 0302   GFRAA 14* 03/16/2013 0302   Lab Results  Component Value Date   CHOL 116 03/16/2013   HDL 34* 03/16/2013   LDLCALC 62 03/16/2013   TRIG 98 03/16/2013   CHOLHDL 3.4 03/16/2013   Lab Results  Component Value Date   HGBA1C 7.0* 03/16/2013      ASSESSMENT AND PLAN 64 y.o. year old male  has a past medical history of Hyperlipidemia; Chronic back pain; History of colon polyps; H/O hiatal hernia; Urinary frequency; Peripheral edema; Nocturia; CVA (cerebral infarction) (03/2013); Stroke; Sleep apnea; Hypertension; Diabetes mellitus; Chronic kidney disease; GERD (gastroesophageal reflux disease); Arthritis; and cardiovascular stress test. here with:  1. History of CVA  Overall the patient is doing well. He will continue aspirin for stroke prevention and strict control of diabetes with hemoglobin A1c goal below 6.5%, lipids with LDL cholesterol goal below 70 mg percent and hypertension with blood pressure goal below 130/90. Continue to use CPAP every night for sleep apnea. Continue with healthy eating and light exercise. Patient was advised if he has any strokelike symptoms to go to the emergency room immediately. He will follow-up in 6 months or sooner if needed.   Ward Givens, MSN, NP-C 03/13/2014, 8:20 AM [facility] [address], [place], [place] [address] [phone]  Note: This document was prepared with digital dictation and possible smart phrase technology. Any transcriptional errors that result from this process are unintentional.

## 2014-03-14 ENCOUNTER — Encounter: Payer: Self-pay | Admitting: Internal Medicine

## 2014-03-17 NOTE — Progress Notes (Signed)
I agree with the above plan 

## 2014-03-19 ENCOUNTER — Ambulatory Visit (INDEPENDENT_AMBULATORY_CARE_PROVIDER_SITE_OTHER): Payer: Medicare Other | Admitting: *Deleted

## 2014-03-19 DIAGNOSIS — I639 Cerebral infarction, unspecified: Secondary | ICD-10-CM | POA: Diagnosis not present

## 2014-03-20 DIAGNOSIS — Z1389 Encounter for screening for other disorder: Secondary | ICD-10-CM | POA: Diagnosis not present

## 2014-03-20 DIAGNOSIS — I10 Essential (primary) hypertension: Secondary | ICD-10-CM | POA: Diagnosis not present

## 2014-03-20 DIAGNOSIS — E1165 Type 2 diabetes mellitus with hyperglycemia: Secondary | ICD-10-CM | POA: Diagnosis not present

## 2014-03-20 DIAGNOSIS — E780 Pure hypercholesterolemia: Secondary | ICD-10-CM | POA: Diagnosis not present

## 2014-03-20 NOTE — Progress Notes (Signed)
Loop recorder 

## 2014-03-21 DIAGNOSIS — Z940 Kidney transplant status: Secondary | ICD-10-CM | POA: Diagnosis not present

## 2014-04-02 LAB — MDC_IDC_ENUM_SESS_TYPE_REMOTE

## 2014-04-04 DIAGNOSIS — E785 Hyperlipidemia, unspecified: Secondary | ICD-10-CM | POA: Diagnosis not present

## 2014-04-04 DIAGNOSIS — Z7982 Long term (current) use of aspirin: Secondary | ICD-10-CM | POA: Diagnosis not present

## 2014-04-04 DIAGNOSIS — B349 Viral infection, unspecified: Secondary | ICD-10-CM | POA: Diagnosis not present

## 2014-04-04 DIAGNOSIS — N185 Chronic kidney disease, stage 5: Secondary | ICD-10-CM | POA: Diagnosis not present

## 2014-04-04 DIAGNOSIS — Z940 Kidney transplant status: Secondary | ICD-10-CM | POA: Diagnosis not present

## 2014-04-04 DIAGNOSIS — D631 Anemia in chronic kidney disease: Secondary | ICD-10-CM | POA: Diagnosis not present

## 2014-04-04 DIAGNOSIS — Z79899 Other long term (current) drug therapy: Secondary | ICD-10-CM | POA: Diagnosis not present

## 2014-04-04 DIAGNOSIS — Z6835 Body mass index (BMI) 35.0-35.9, adult: Secondary | ICD-10-CM | POA: Diagnosis not present

## 2014-04-04 DIAGNOSIS — D8989 Other specified disorders involving the immune mechanism, not elsewhere classified: Secondary | ICD-10-CM | POA: Diagnosis not present

## 2014-04-04 DIAGNOSIS — I120 Hypertensive chronic kidney disease with stage 5 chronic kidney disease or end stage renal disease: Secondary | ICD-10-CM | POA: Diagnosis not present

## 2014-04-04 DIAGNOSIS — Z794 Long term (current) use of insulin: Secondary | ICD-10-CM | POA: Diagnosis not present

## 2014-04-04 DIAGNOSIS — Z4822 Encounter for aftercare following kidney transplant: Secondary | ICD-10-CM | POA: Diagnosis not present

## 2014-04-04 DIAGNOSIS — E669 Obesity, unspecified: Secondary | ICD-10-CM | POA: Diagnosis not present

## 2014-04-08 DIAGNOSIS — G4733 Obstructive sleep apnea (adult) (pediatric): Secondary | ICD-10-CM | POA: Diagnosis not present

## 2014-04-08 DIAGNOSIS — N189 Chronic kidney disease, unspecified: Secondary | ICD-10-CM | POA: Diagnosis not present

## 2014-04-08 DIAGNOSIS — Z79899 Other long term (current) drug therapy: Secondary | ICD-10-CM | POA: Diagnosis not present

## 2014-04-08 DIAGNOSIS — Z940 Kidney transplant status: Secondary | ICD-10-CM | POA: Diagnosis not present

## 2014-04-08 DIAGNOSIS — E669 Obesity, unspecified: Secondary | ICD-10-CM | POA: Diagnosis not present

## 2014-04-08 DIAGNOSIS — E1122 Type 2 diabetes mellitus with diabetic chronic kidney disease: Secondary | ICD-10-CM | POA: Diagnosis not present

## 2014-04-08 DIAGNOSIS — Z9889 Other specified postprocedural states: Secondary | ICD-10-CM | POA: Diagnosis not present

## 2014-04-08 DIAGNOSIS — Z79891 Long term (current) use of opiate analgesic: Secondary | ICD-10-CM | POA: Diagnosis not present

## 2014-04-08 DIAGNOSIS — D899 Disorder involving the immune mechanism, unspecified: Secondary | ICD-10-CM | POA: Diagnosis not present

## 2014-04-08 DIAGNOSIS — Z8673 Personal history of transient ischemic attack (TIA), and cerebral infarction without residual deficits: Secondary | ICD-10-CM | POA: Diagnosis not present

## 2014-04-08 DIAGNOSIS — Z888 Allergy status to other drugs, medicaments and biological substances status: Secondary | ICD-10-CM | POA: Diagnosis not present

## 2014-04-08 DIAGNOSIS — B349 Viral infection, unspecified: Secondary | ICD-10-CM | POA: Diagnosis not present

## 2014-04-08 DIAGNOSIS — Z794 Long term (current) use of insulin: Secondary | ICD-10-CM | POA: Diagnosis not present

## 2014-04-08 DIAGNOSIS — E785 Hyperlipidemia, unspecified: Secondary | ICD-10-CM | POA: Diagnosis not present

## 2014-04-08 DIAGNOSIS — D8989 Other specified disorders involving the immune mechanism, not elsewhere classified: Secondary | ICD-10-CM | POA: Diagnosis not present

## 2014-04-08 DIAGNOSIS — Z7982 Long term (current) use of aspirin: Secondary | ICD-10-CM | POA: Diagnosis not present

## 2014-04-08 DIAGNOSIS — N185 Chronic kidney disease, stage 5: Secondary | ICD-10-CM | POA: Diagnosis not present

## 2014-04-08 DIAGNOSIS — D649 Anemia, unspecified: Secondary | ICD-10-CM | POA: Diagnosis not present

## 2014-04-08 DIAGNOSIS — E119 Type 2 diabetes mellitus without complications: Secondary | ICD-10-CM | POA: Diagnosis not present

## 2014-04-08 DIAGNOSIS — I120 Hypertensive chronic kidney disease with stage 5 chronic kidney disease or end stage renal disease: Secondary | ICD-10-CM | POA: Diagnosis not present

## 2014-04-16 ENCOUNTER — Encounter: Payer: Self-pay | Admitting: Internal Medicine

## 2014-04-18 ENCOUNTER — Ambulatory Visit (INDEPENDENT_AMBULATORY_CARE_PROVIDER_SITE_OTHER): Payer: Medicare Other | Admitting: *Deleted

## 2014-04-18 DIAGNOSIS — I639 Cerebral infarction, unspecified: Secondary | ICD-10-CM | POA: Diagnosis not present

## 2014-04-21 NOTE — Progress Notes (Signed)
Loop recorder 

## 2014-04-22 DIAGNOSIS — E785 Hyperlipidemia, unspecified: Secondary | ICD-10-CM | POA: Diagnosis not present

## 2014-04-22 DIAGNOSIS — Z792 Long term (current) use of antibiotics: Secondary | ICD-10-CM | POA: Diagnosis not present

## 2014-04-22 DIAGNOSIS — D649 Anemia, unspecified: Secondary | ICD-10-CM | POA: Diagnosis not present

## 2014-04-22 DIAGNOSIS — Z940 Kidney transplant status: Secondary | ICD-10-CM | POA: Diagnosis not present

## 2014-04-22 DIAGNOSIS — D899 Disorder involving the immune mechanism, unspecified: Secondary | ICD-10-CM | POA: Diagnosis not present

## 2014-04-22 DIAGNOSIS — D8989 Other specified disorders involving the immune mechanism, not elsewhere classified: Secondary | ICD-10-CM | POA: Diagnosis not present

## 2014-04-22 DIAGNOSIS — E119 Type 2 diabetes mellitus without complications: Secondary | ICD-10-CM | POA: Diagnosis not present

## 2014-04-22 DIAGNOSIS — I10 Essential (primary) hypertension: Secondary | ICD-10-CM | POA: Diagnosis not present

## 2014-05-06 DIAGNOSIS — Z940 Kidney transplant status: Secondary | ICD-10-CM | POA: Diagnosis not present

## 2014-05-19 ENCOUNTER — Ambulatory Visit (INDEPENDENT_AMBULATORY_CARE_PROVIDER_SITE_OTHER): Payer: Medicare Other | Admitting: *Deleted

## 2014-05-19 DIAGNOSIS — I639 Cerebral infarction, unspecified: Secondary | ICD-10-CM | POA: Diagnosis not present

## 2014-05-20 DIAGNOSIS — Z79899 Other long term (current) drug therapy: Secondary | ICD-10-CM | POA: Diagnosis not present

## 2014-05-20 DIAGNOSIS — B349 Viral infection, unspecified: Secondary | ICD-10-CM | POA: Diagnosis not present

## 2014-05-20 DIAGNOSIS — D8989 Other specified disorders involving the immune mechanism, not elsewhere classified: Secondary | ICD-10-CM | POA: Diagnosis not present

## 2014-05-20 DIAGNOSIS — E1122 Type 2 diabetes mellitus with diabetic chronic kidney disease: Secondary | ICD-10-CM | POA: Diagnosis not present

## 2014-05-20 DIAGNOSIS — Z4822 Encounter for aftercare following kidney transplant: Secondary | ICD-10-CM | POA: Diagnosis not present

## 2014-05-20 DIAGNOSIS — Z794 Long term (current) use of insulin: Secondary | ICD-10-CM | POA: Diagnosis not present

## 2014-05-20 DIAGNOSIS — D631 Anemia in chronic kidney disease: Secondary | ICD-10-CM | POA: Diagnosis not present

## 2014-05-20 DIAGNOSIS — Z940 Kidney transplant status: Secondary | ICD-10-CM | POA: Diagnosis not present

## 2014-05-20 DIAGNOSIS — I120 Hypertensive chronic kidney disease with stage 5 chronic kidney disease or end stage renal disease: Secondary | ICD-10-CM | POA: Diagnosis not present

## 2014-05-20 DIAGNOSIS — E785 Hyperlipidemia, unspecified: Secondary | ICD-10-CM | POA: Diagnosis not present

## 2014-05-20 DIAGNOSIS — N185 Chronic kidney disease, stage 5: Secondary | ICD-10-CM | POA: Diagnosis not present

## 2014-05-20 LAB — CUP PACEART REMOTE DEVICE CHECK: MDC IDC SESS DTM: 20160510130540

## 2014-05-21 DIAGNOSIS — G4733 Obstructive sleep apnea (adult) (pediatric): Secondary | ICD-10-CM | POA: Diagnosis not present

## 2014-06-03 DIAGNOSIS — Z940 Kidney transplant status: Secondary | ICD-10-CM | POA: Diagnosis not present

## 2014-06-12 ENCOUNTER — Encounter: Payer: Self-pay | Admitting: Internal Medicine

## 2014-06-12 LAB — CUP PACEART REMOTE DEVICE CHECK: Date Time Interrogation Session: 20160602150922

## 2014-06-13 NOTE — Progress Notes (Signed)
Loop recorder 

## 2014-06-17 ENCOUNTER — Ambulatory Visit (INDEPENDENT_AMBULATORY_CARE_PROVIDER_SITE_OTHER): Payer: Medicare Other | Admitting: *Deleted

## 2014-06-17 DIAGNOSIS — I10 Essential (primary) hypertension: Secondary | ICD-10-CM | POA: Diagnosis not present

## 2014-06-17 DIAGNOSIS — I639 Cerebral infarction, unspecified: Secondary | ICD-10-CM

## 2014-06-17 DIAGNOSIS — Z4822 Encounter for aftercare following kidney transplant: Secondary | ICD-10-CM | POA: Diagnosis not present

## 2014-06-17 DIAGNOSIS — D899 Disorder involving the immune mechanism, unspecified: Secondary | ICD-10-CM | POA: Diagnosis not present

## 2014-06-17 DIAGNOSIS — E119 Type 2 diabetes mellitus without complications: Secondary | ICD-10-CM | POA: Diagnosis not present

## 2014-06-17 DIAGNOSIS — B349 Viral infection, unspecified: Secondary | ICD-10-CM | POA: Diagnosis not present

## 2014-06-17 DIAGNOSIS — Z940 Kidney transplant status: Secondary | ICD-10-CM | POA: Diagnosis not present

## 2014-06-17 DIAGNOSIS — E785 Hyperlipidemia, unspecified: Secondary | ICD-10-CM | POA: Diagnosis not present

## 2014-06-17 DIAGNOSIS — H6121 Impacted cerumen, right ear: Secondary | ICD-10-CM | POA: Diagnosis not present

## 2014-06-17 DIAGNOSIS — Z794 Long term (current) use of insulin: Secondary | ICD-10-CM | POA: Diagnosis not present

## 2014-06-17 DIAGNOSIS — Z79899 Other long term (current) drug therapy: Secondary | ICD-10-CM | POA: Diagnosis not present

## 2014-06-18 NOTE — Progress Notes (Signed)
Loop recorder 

## 2014-06-26 LAB — CUP PACEART REMOTE DEVICE CHECK: MDC IDC SESS DTM: 20160616124537

## 2014-07-01 DIAGNOSIS — Z940 Kidney transplant status: Secondary | ICD-10-CM | POA: Diagnosis not present

## 2014-07-08 ENCOUNTER — Encounter: Payer: Self-pay | Admitting: Internal Medicine

## 2014-07-11 DIAGNOSIS — S9031XA Contusion of right foot, initial encounter: Secondary | ICD-10-CM | POA: Diagnosis not present

## 2014-07-15 DIAGNOSIS — Z888 Allergy status to other drugs, medicaments and biological substances status: Secondary | ICD-10-CM | POA: Diagnosis not present

## 2014-07-15 DIAGNOSIS — Z794 Long term (current) use of insulin: Secondary | ICD-10-CM | POA: Diagnosis not present

## 2014-07-15 DIAGNOSIS — E1122 Type 2 diabetes mellitus with diabetic chronic kidney disease: Secondary | ICD-10-CM | POA: Diagnosis not present

## 2014-07-15 DIAGNOSIS — I120 Hypertensive chronic kidney disease with stage 5 chronic kidney disease or end stage renal disease: Secondary | ICD-10-CM | POA: Diagnosis not present

## 2014-07-15 DIAGNOSIS — Z79899 Other long term (current) drug therapy: Secondary | ICD-10-CM | POA: Diagnosis not present

## 2014-07-15 DIAGNOSIS — Z7982 Long term (current) use of aspirin: Secondary | ICD-10-CM | POA: Diagnosis not present

## 2014-07-15 DIAGNOSIS — Z940 Kidney transplant status: Secondary | ICD-10-CM | POA: Diagnosis not present

## 2014-07-15 DIAGNOSIS — Z886 Allergy status to analgesic agent status: Secondary | ICD-10-CM | POA: Diagnosis not present

## 2014-07-15 DIAGNOSIS — N185 Chronic kidney disease, stage 5: Secondary | ICD-10-CM | POA: Diagnosis not present

## 2014-07-15 DIAGNOSIS — Z79891 Long term (current) use of opiate analgesic: Secondary | ICD-10-CM | POA: Diagnosis not present

## 2014-07-15 DIAGNOSIS — D8989 Other specified disorders involving the immune mechanism, not elsewhere classified: Secondary | ICD-10-CM | POA: Diagnosis not present

## 2014-07-16 ENCOUNTER — Encounter: Payer: Self-pay | Admitting: Internal Medicine

## 2014-07-17 ENCOUNTER — Ambulatory Visit (INDEPENDENT_AMBULATORY_CARE_PROVIDER_SITE_OTHER): Payer: Medicare Other | Admitting: *Deleted

## 2014-07-17 DIAGNOSIS — I639 Cerebral infarction, unspecified: Secondary | ICD-10-CM | POA: Diagnosis not present

## 2014-07-17 NOTE — Progress Notes (Signed)
Loop recorder 

## 2014-07-18 LAB — CUP PACEART REMOTE DEVICE CHECK: Date Time Interrogation Session: 20160708110832

## 2014-07-29 DIAGNOSIS — Z940 Kidney transplant status: Secondary | ICD-10-CM | POA: Diagnosis not present

## 2014-07-30 ENCOUNTER — Encounter: Payer: Self-pay | Admitting: Internal Medicine

## 2014-08-12 DIAGNOSIS — I129 Hypertensive chronic kidney disease with stage 1 through stage 4 chronic kidney disease, or unspecified chronic kidney disease: Secondary | ICD-10-CM | POA: Diagnosis not present

## 2014-08-12 DIAGNOSIS — E1122 Type 2 diabetes mellitus with diabetic chronic kidney disease: Secondary | ICD-10-CM | POA: Diagnosis not present

## 2014-08-12 DIAGNOSIS — M549 Dorsalgia, unspecified: Secondary | ICD-10-CM | POA: Diagnosis not present

## 2014-08-12 DIAGNOSIS — E119 Type 2 diabetes mellitus without complications: Secondary | ICD-10-CM | POA: Diagnosis not present

## 2014-08-12 DIAGNOSIS — G4733 Obstructive sleep apnea (adult) (pediatric): Secondary | ICD-10-CM | POA: Diagnosis not present

## 2014-08-12 DIAGNOSIS — N2889 Other specified disorders of kidney and ureter: Secondary | ICD-10-CM | POA: Diagnosis not present

## 2014-08-12 DIAGNOSIS — Z8673 Personal history of transient ischemic attack (TIA), and cerebral infarction without residual deficits: Secondary | ICD-10-CM | POA: Diagnosis not present

## 2014-08-12 DIAGNOSIS — Z6834 Body mass index (BMI) 34.0-34.9, adult: Secondary | ICD-10-CM | POA: Diagnosis not present

## 2014-08-12 DIAGNOSIS — Z79899 Other long term (current) drug therapy: Secondary | ICD-10-CM | POA: Diagnosis not present

## 2014-08-12 DIAGNOSIS — Z940 Kidney transplant status: Secondary | ICD-10-CM | POA: Diagnosis not present

## 2014-08-12 DIAGNOSIS — G8929 Other chronic pain: Secondary | ICD-10-CM | POA: Diagnosis not present

## 2014-08-12 DIAGNOSIS — I151 Hypertension secondary to other renal disorders: Secondary | ICD-10-CM | POA: Diagnosis not present

## 2014-08-12 DIAGNOSIS — Z4822 Encounter for aftercare following kidney transplant: Secondary | ICD-10-CM | POA: Diagnosis not present

## 2014-08-12 DIAGNOSIS — N185 Chronic kidney disease, stage 5: Secondary | ICD-10-CM | POA: Diagnosis not present

## 2014-08-15 ENCOUNTER — Ambulatory Visit (INDEPENDENT_AMBULATORY_CARE_PROVIDER_SITE_OTHER): Payer: Medicare Other | Admitting: *Deleted

## 2014-08-15 DIAGNOSIS — I639 Cerebral infarction, unspecified: Secondary | ICD-10-CM | POA: Diagnosis not present

## 2014-08-18 DIAGNOSIS — G4733 Obstructive sleep apnea (adult) (pediatric): Secondary | ICD-10-CM | POA: Diagnosis not present

## 2014-08-19 NOTE — Progress Notes (Signed)
Loop recorder 

## 2014-08-25 LAB — CUP PACEART REMOTE DEVICE CHECK: Date Time Interrogation Session: 20160815082042

## 2014-09-01 ENCOUNTER — Encounter: Payer: Self-pay | Admitting: Adult Health

## 2014-09-01 ENCOUNTER — Ambulatory Visit (INDEPENDENT_AMBULATORY_CARE_PROVIDER_SITE_OTHER): Payer: Medicare Other | Admitting: Adult Health

## 2014-09-01 VITALS — BP 121/67 | HR 64 | Ht 71.0 in | Wt 240.0 lb

## 2014-09-01 DIAGNOSIS — Z8673 Personal history of transient ischemic attack (TIA), and cerebral infarction without residual deficits: Secondary | ICD-10-CM | POA: Diagnosis not present

## 2014-09-01 NOTE — Patient Instructions (Signed)
Overall the patient is doing well. He will continue aspirin for stroke prevention and strict control of diabetes with hemoglobin A1c goal below 6.5%, lipids with LDL cholesterol goal below 70 mg percent and hypertension with blood pressure goal below 130/90. Continue to use CPAP every night for sleep apnea. Continue with healthy eating and light exercise.

## 2014-09-01 NOTE — Progress Notes (Signed)
PATIENT: MCALLISTER DALESIO DOB: [birth year]  REASON FOR VISIT: follow up- stroke HISTORY FROM: patient  HISTORY OF PRESENT ILLNESS: Mr. Guarini is a 64 year old male with a history of CVA in March 2015. He returns today for follow-up. The patient continues to take aspirin and tolerates it well. Patient states that his blood pressure has been well controlled. Today his blood pressure is in normal range. The patient states that his primary care provider will check his hemoglobin A1c and lipids next week when he has his yearly physical. The patient states that he has been doing well. Denies any additional strokelike symptoms. The patient does report that he has a mild foot drop on the right after a back surgery. He states that he uses a cane to ambulate with. Denies any falls. He does have an AFO brace but does not feel that he needs it at this time. Patient denies any new neurological symptoms. He returns today for an evaluation.  HISTORY 03/13/14:Mr. Haq is a 64 year old male with a history of CVA in March 2015. He returns today for follow-up. He is currently taking aspirin and tolerating it well. Denies any significant bruising or bleeding. Patient states that his blood pressure has been well controlled. Today it is slightly elevated however the patient states at home his systolic blood pressure normally runs between 125-140. Patient continues with statin medication to control his cholesterol. He states that his M.D. told him that his recent cholesterol levels were normal. . Patient's diabetes has also been controlled. The patient is on Lantus at bedtime. He states that his fasting blood sugars normally run around 100. His hemoglobin A1c after his kidney transplant was 6.5%. After the stroke the patient did have some issues with his speech however this has resolved. The patient continues to use CPAP every night for sleep apnea. He reports this has benefited his sleep. The patient did have a successful  kidney transplant. His sister donated one of her kidneys. He states that he has been doing well. Denies any new neurological symptoms. No new medical issues since last seen.  HISTORY (SETHI): 47 year Caucasian male seen for first office followup visit for hospital admission for stroke in March 2015. He was admitted on 03/15/13 with sudden onset of right-sided numbness and difficulty finding words while eating dinner. He was evaluated at [facility] and was given IV t-PA and transferred to [facility]. He had several episodes of right arm shaking and was started on IV Keppra for this. MRI scan confirmed left MCA small cortical infarct an MRA of the brain showed no large vessel stenosis. Carotid ultrasound showed no extracranial stenosis. Transthoracic echo showed normal ejection fraction. TEE showed no cardiac source of embolism or PFO. Telemetry monitoring did not reveal cardiac arrhythmias. EEG showed no evidence of seizures. LDL cholesterol was 62 patient was on statin pattern admission. Hemoglobin A1c was 7.0. Patient who had stage IV kidney disease and is currently under evaluation for renal transplant. He states his done well sensation she speech is completely back to normal only occasionally he [name] a few words. He had loop recorder implanted by a cardiologist and so far has not been found to have a true fibrillation over 2 months. His back to work and has no limitations. He plans to exercise and lose weight. He has no complaints today.   Update 09/12/13 (LL): Mr. Minette Brine returns to the office today for stroke followup he states that he has been well. He is  found a kidney donor which is his younger sister that will be donating a kidney and transplant surgery will take place in 19 days at [facility]. He had a left upper arm AV fistula placed by Dr. Bridgett Larsson which is maturing but he is not had to start hemodialysis yet. He has no residual deficits from his stroke.  His blood pressure is been well controlled is 123/66 in office today. He states he wears a CPAP every night and feels better doing so. His Lantus was recently increased by 3 units each night and his blood sugars are much better usually averaging in the 120s fasting. He is tolerating daily aspirin 325 mg well without significant bruising or bleeding. He has no new complaints today.   REVIEW OF SYSTEMS: Out of a complete 14 system review of symptoms, the patient complains only of the following symptoms, and all other reviewed systems are negative.  Back pain  ALLERGIES: Allergies  Allergen Reactions  . Lisinopril Other (See Comments)    Increased potassium level   . Meloxicam Nausea And Vomiting  . Victoza [Liraglutide] Diarrhea, Nausea And Vomiting and Swelling    HOME MEDICATIONS: Outpatient Prescriptions Prior to Visit  Medication Sig Dispense Refill  . amLODipine (NORVASC) 10 MG tablet Take 10 mg by mouth daily after breakfast.     . cloNIDine (CATAPRES) 0.3 MG tablet Take 0.3 mg by mouth 3 (three) times daily.    . cyclobenzaprine (FLEXERIL) 10 MG tablet Take 10 mg by mouth 3 (three) times daily as needed for muscle spasms.    . furosemide (LASIX) 40 MG tablet     . furosemide (LASIX) 80 MG tablet Take 80-160 mg by mouth 2 (two) times daily. 160mg  every am and 80mg  in the pm    . HYDROcodone-acetaminophen (NORCO) 10-325 MG per tablet Take 1 tablet by mouth every 4 (four) hours as needed for moderate pain. 30 tablet 0  . Insulin Aspart (NOVOLOG [place]) Inject into the skin as directed.    [name] LANTUS SOLOSTAR 100 UNIT/ML Solostar Pen Inject 33 Units into the skin at bedtime.     . lovastatin (MEVACOR) 40 MG tablet Take 40 mg by mouth at bedtime.    . mycophenolate (MYFORTIC) 180 MG EC tablet Take 180 mg by mouth 2 (two) times daily.    [name] omeprazole (PRILOSEC) 20 MG capsule Take 20 mg by mouth daily.    . ONE TOUCH ULTRA TEST test strip     . phosphorus (K PHOS NEUTRAL) 155-852-130 MG tablet  Take 250 mg by mouth daily.    . predniSONE (STERAPRED UNI-PAK) 5 MG TABS tablet Take 5 mg by mouth daily.    [name] sulfamethoxazole-trimethoprim (BACTRIM,SEPTRA) 400-80 MG per tablet Take 1 tablet by mouth 3 (three) times a week.    . tacrolimus (PROGRAF) 1 MG capsule Take 1 mg by mouth 2 (two) times daily.    . tamsulosin (FLOMAX) 0.4 MG CAPS capsule Take 0.4 mg by mouth daily after breakfast.    . valGANciclovir (VALCYTE) 450 MG tablet Take 450 mg by mouth daily.    [name] aspirin EC 325 MG EC tablet Take 1 tablet (325 mg total) by mouth daily. 30 tablet 0   No facility-administered medications prior to visit.    PAST MEDICAL HISTORY: Past Medical History  Diagnosis Date  . Hyperlipidemia     takes Lovastatin daily  . Chronic back pain     radiculopathy and stenosis  . History of colon polyps   .  H/O hiatal hernia   . Urinary frequency   . Peripheral edema     takes Lasix daily  . Nocturia   . CVA (cerebral infarction) 03/2013  . Stroke     speech, rt arm weakness  . Sleep apnea     cpap , study in their home, 09/2102- [employer]           . Hypertension     takes Amlodipine and Catapres daily    dr Forrestine Him, Loop Recorder - [facility]  . Diabetes mellitus      Lantus nightly ;type 2  . Chronic kidney disease     [name], awaiting Transplant   . GERD (gastroesophageal reflux disease)   . Arthritis     back, hands   . Hx of cardiovascular stress test     a.  Lexiscan Myoview (05/2013):  No ischemia, EF 53%; Normal Study    PAST SURGICAL HISTORY: Past Surgical History  Procedure Laterality Date  . Right leg surgery      pin in place  . Neuroplasty / transposition median nerve at carpal tunnel bilateral    . Right wrist surgery    . Left knee surgery    . Hemorrhoid surgery    . Colonoscopy    . Esophagogastroduodenoscopy    . Loop recorder implant  03/19/13    MDT LinQ implanted by Dr Rayann Heman for cryptogenic stroke  . Tee without cardioversion N/A 03/19/2013    Procedure:  TRANSESOPHAGEAL ECHOCARDIOGRAM (TEE);  Surgeon: [name], MD;  Location: [facility];  Service: Cardiovascular;  Laterality: N/A;  . Av fistula placement Left 04/03/2013    Procedure: ARTERIOVENOUS (AV) FISTULA CREATION- LEFT RADIOCEPHALIC VS BRACHIOCEPHALIC;  Surgeon: Conrad Larned, MD;  Location: [facility];  Service: Vascular;  Laterality: Left;  . Back surgery  12/2012    2003- 1st back surgery & then 2014- fusion  . Av fistula placement Left 05/14/2013    Procedure: LEFT BRACHIOCEPHALIC ARTERIOVENOUS (AV) FISTULA CREATION;  Surgeon: Conrad Neenah, MD;  Location: [facility];  Service: Vascular;  Laterality: Left;  . Loop recorder implant N/A 03/19/2013    Procedure: LOOP RECORDER IMPLANT;  Surgeon: Coralyn Mark, MD;  Location: [facility];  Service: Cardiovascular;  Laterality: N/A;  . Kidney transplant  2016    FAMILY HISTORY: Family History  Problem Relation Age of Onset  . Hypertension Mother   . Hyperlipidemia Father   . Hypertension Father   . Heart disease Father     before age 6  . Diabetes Brother   . Hyperlipidemia Son     SOCIAL HISTORY: Social History   Social History  . Marital Status: Married    Spouse Name: agnes  . Number of Children: 5  . Years of Education: 8th   Occupational History  . disabled    Social History Main Topics  . Smoking status: Former Smoker -- 0.50 packs/day for 40 years    Types: Cigarettes    Quit date: 12/27/2012  . Smokeless tobacco: Never Used  . Alcohol Use: No  . Drug Use: No  . Sexual Activity: Yes   Other Topics Concern  . Not on file   Social History Narrative   Patient lives at home with his wife Herbert Pun).   Patient is disabled.   Education 8th grade.   Right handed.   Caffeine One cup of coffee daily.      PHYSICAL EXAM  Filed Vitals:   09/01/14 [id number]  Height: 5\' 11"  (1.803 m)  Weight: 240 lb (108.863 kg)   Body mass index is 33.49 kg/(m^2).  Generalized: Well developed, in no acute distress    Neurological examination  Mentation: Alert oriented to time, place, history taking. Follows all commands speech and language fluent Cranial nerve II-XII: Pupils were equal round reactive to light. Extraocular movements were full, visual field were full on confrontational test. Facial sensation and strength were normal. Uvula tongue midline. Head turning and shoulder shrug  were normal and symmetric. Motor: The motor testing reveals 5 over 5 strength of all 4 extremities. Good symmetric motor tone is noted throughout. Mild foot drop on the right Sensory: Sensory testing is intact to soft touch on all 4 extremities. No evidence of extinction is noted.  Coordination: Cerebellar testing reveals good finger-nose-finger and heel-to-shin bilaterally.  Gait and station: Patient uses a cane when ambulating. He has a mild steppage type gait on the right. Romberg is negative.    DIAGNOSTIC DATA (LABS, IMAGING, TESTING) - I reviewed patient records, labs, notes, testing and imaging myself where available.  Lab Results  Component Value Date   WBC 7.5 03/16/2013   HGB 11.9* 05/14/2013   HCT 35.0* 05/14/2013   MCV 87.6 03/16/2013   PLT 115* 03/16/2013      Component Value Date/Time   NA 141 05/14/2013 0607   K 4.5 05/14/2013 0607   CL 110 03/16/2013 0302   CO2 20 03/16/2013 0302   GLUCOSE 155* 05/14/2013 0607   BUN 49* 03/16/2013 0302   CREATININE 4.71* 03/16/2013 0302   CALCIUM 8.4 03/16/2013 0302   PROT 6.7 03/16/2013 0302   ALBUMIN 3.3* 03/16/2013 0302   AST 9 03/16/2013 0302   ALT 11 03/16/2013 0302   ALKPHOS 62 03/16/2013 0302   BILITOT 0.2* 03/16/2013 0302   GFRNONAA 12* 03/16/2013 0302   GFRAA 14* 03/16/2013 0302   Lab Results  Component Value Date   CHOL 116 03/16/2013   HDL 34* 03/16/2013   LDLCALC 62 03/16/2013   TRIG 98 03/16/2013   CHOLHDL 3.4 03/16/2013   Lab Results  Component Value Date   HGBA1C 7.0* 03/16/2013      ASSESSMENT AND PLAN 64 y.o. year old male   has a past medical history of Hyperlipidemia; Chronic back pain; History of colon polyps; H/O hiatal hernia; Urinary frequency; Peripheral edema; Nocturia; CVA (cerebral infarction) (03/2013); Stroke; Sleep apnea; Hypertension; Diabetes mellitus; Chronic kidney disease; GERD (gastroesophageal reflux disease); Arthritis; and cardiovascular stress test. here with:  1. History of CVA  Overall the patient is doing well. He will continue aspirin for stroke prevention and strict control of diabetes with hemoglobin A1c goal below 6.5%, lipids with LDL cholesterol goal below 70 mg percent and hypertension with blood pressure goal below 130/90. Continue to use CPAP every night for sleep apnea. Continue with healthy eating and light exercise. Patient was advised if he has any strokelike symptoms to go to the emergency room immediately. The patient will continue to follow with his primary care provider he can follow up with our office on an as-needed basis.   Ward Givens, MSN, NP-C 09/01/2014, 7:32 AM [facility] [address] [place], [place] [address] [phone]  Note: This document was prepared with digital dictation and possible smart phrase technology. Any transcriptional errors that result from this process are unintentional.

## 2014-09-01 NOTE — Progress Notes (Signed)
I agree with the above plan 

## 2014-09-01 NOTE — Progress Notes (Signed)
I agree with the assessment and plan as directed by NP .The patient is known to Dr. Leonie Man, I sign today for him as the work in doctor.     Jon Barner, MD

## 2014-09-03 ENCOUNTER — Encounter: Payer: Self-pay | Admitting: Internal Medicine

## 2014-09-09 DIAGNOSIS — Z0000 Encounter for general adult medical examination without abnormal findings: Secondary | ICD-10-CM | POA: Diagnosis not present

## 2014-09-09 DIAGNOSIS — E1165 Type 2 diabetes mellitus with hyperglycemia: Secondary | ICD-10-CM | POA: Diagnosis not present

## 2014-09-10 DIAGNOSIS — N185 Chronic kidney disease, stage 5: Secondary | ICD-10-CM | POA: Diagnosis not present

## 2014-09-10 DIAGNOSIS — Z992 Dependence on renal dialysis: Secondary | ICD-10-CM | POA: Diagnosis not present

## 2014-09-10 DIAGNOSIS — Z940 Kidney transplant status: Secondary | ICD-10-CM | POA: Diagnosis not present

## 2014-09-10 DIAGNOSIS — Z7982 Long term (current) use of aspirin: Secondary | ICD-10-CM | POA: Diagnosis not present

## 2014-09-10 DIAGNOSIS — G4733 Obstructive sleep apnea (adult) (pediatric): Secondary | ICD-10-CM | POA: Diagnosis not present

## 2014-09-10 DIAGNOSIS — I129 Hypertensive chronic kidney disease with stage 1 through stage 4 chronic kidney disease, or unspecified chronic kidney disease: Secondary | ICD-10-CM | POA: Diagnosis not present

## 2014-09-10 DIAGNOSIS — E119 Type 2 diabetes mellitus without complications: Secondary | ICD-10-CM | POA: Diagnosis not present

## 2014-09-10 DIAGNOSIS — Z8673 Personal history of transient ischemic attack (TIA), and cerebral infarction without residual deficits: Secondary | ICD-10-CM | POA: Diagnosis not present

## 2014-09-10 DIAGNOSIS — I151 Hypertension secondary to other renal disorders: Secondary | ICD-10-CM | POA: Diagnosis not present

## 2014-09-10 DIAGNOSIS — G8929 Other chronic pain: Secondary | ICD-10-CM | POA: Diagnosis not present

## 2014-09-10 DIAGNOSIS — E1122 Type 2 diabetes mellitus with diabetic chronic kidney disease: Secondary | ICD-10-CM | POA: Diagnosis not present

## 2014-09-10 DIAGNOSIS — Z794 Long term (current) use of insulin: Secondary | ICD-10-CM | POA: Diagnosis not present

## 2014-09-10 DIAGNOSIS — Z981 Arthrodesis status: Secondary | ICD-10-CM | POA: Diagnosis not present

## 2014-09-16 ENCOUNTER — Ambulatory Visit (INDEPENDENT_AMBULATORY_CARE_PROVIDER_SITE_OTHER): Payer: Medicare Other | Admitting: *Deleted

## 2014-09-16 DIAGNOSIS — I639 Cerebral infarction, unspecified: Secondary | ICD-10-CM | POA: Diagnosis not present

## 2014-09-17 NOTE — Progress Notes (Signed)
Loop recorder 

## 2014-09-22 ENCOUNTER — Ambulatory Visit: Payer: Medicare Other | Admitting: Neurology

## 2014-09-24 DIAGNOSIS — Z940 Kidney transplant status: Secondary | ICD-10-CM | POA: Diagnosis not present

## 2014-09-27 LAB — CUP PACEART REMOTE DEVICE CHECK: Date Time Interrogation Session: 20160917150316

## 2014-09-27 NOTE — Progress Notes (Signed)
Carelink summary report received. Battery status OK. Normal device function. No new symptom episodes, tachy episodes, brady, or pause episodes. No new AF episodes. Monthly summary reports and ROV with JA PRN. 

## 2014-10-08 DIAGNOSIS — Z8673 Personal history of transient ischemic attack (TIA), and cerebral infarction without residual deficits: Secondary | ICD-10-CM | POA: Diagnosis not present

## 2014-10-08 DIAGNOSIS — I129 Hypertensive chronic kidney disease with stage 1 through stage 4 chronic kidney disease, or unspecified chronic kidney disease: Secondary | ICD-10-CM | POA: Diagnosis not present

## 2014-10-08 DIAGNOSIS — G8929 Other chronic pain: Secondary | ICD-10-CM | POA: Diagnosis not present

## 2014-10-08 DIAGNOSIS — Z4822 Encounter for aftercare following kidney transplant: Secondary | ICD-10-CM | POA: Diagnosis not present

## 2014-10-08 DIAGNOSIS — G4733 Obstructive sleep apnea (adult) (pediatric): Secondary | ICD-10-CM | POA: Diagnosis not present

## 2014-10-08 DIAGNOSIS — Z794 Long term (current) use of insulin: Secondary | ICD-10-CM | POA: Diagnosis not present

## 2014-10-08 DIAGNOSIS — Z981 Arthrodesis status: Secondary | ICD-10-CM | POA: Diagnosis not present

## 2014-10-08 DIAGNOSIS — E1122 Type 2 diabetes mellitus with diabetic chronic kidney disease: Secondary | ICD-10-CM | POA: Diagnosis not present

## 2014-10-08 DIAGNOSIS — N189 Chronic kidney disease, unspecified: Secondary | ICD-10-CM | POA: Diagnosis not present

## 2014-10-08 DIAGNOSIS — Z79899 Other long term (current) drug therapy: Secondary | ICD-10-CM | POA: Diagnosis not present

## 2014-10-08 DIAGNOSIS — Z6834 Body mass index (BMI) 34.0-34.9, adult: Secondary | ICD-10-CM | POA: Diagnosis not present

## 2014-10-08 DIAGNOSIS — Z7982 Long term (current) use of aspirin: Secondary | ICD-10-CM | POA: Diagnosis not present

## 2014-10-08 DIAGNOSIS — R609 Edema, unspecified: Secondary | ICD-10-CM | POA: Diagnosis not present

## 2014-10-08 DIAGNOSIS — Z940 Kidney transplant status: Secondary | ICD-10-CM | POA: Diagnosis not present

## 2014-10-15 ENCOUNTER — Ambulatory Visit (INDEPENDENT_AMBULATORY_CARE_PROVIDER_SITE_OTHER): Payer: Medicare Other | Admitting: *Deleted

## 2014-10-15 DIAGNOSIS — I6389 Other cerebral infarction: Secondary | ICD-10-CM

## 2014-10-15 DIAGNOSIS — I638 Other cerebral infarction: Secondary | ICD-10-CM

## 2014-10-15 NOTE — Progress Notes (Signed)
Loop recorder 

## 2014-10-22 DIAGNOSIS — Z940 Kidney transplant status: Secondary | ICD-10-CM | POA: Diagnosis not present

## 2014-10-22 DIAGNOSIS — R7309 Other abnormal glucose: Secondary | ICD-10-CM | POA: Diagnosis not present

## 2014-10-26 LAB — CUP PACEART REMOTE DEVICE CHECK: Date Time Interrogation Session: 20161005070529

## 2014-10-26 NOTE — Progress Notes (Signed)
Carelink summary report received. Battery status OK. Normal device function. No new symptom episodes, tachy episodes, brady, or pause episodes. No new AF episodes. Monthly summary reports and ROV with JA PRN. 

## 2014-11-04 ENCOUNTER — Encounter: Payer: Self-pay | Admitting: Internal Medicine

## 2014-11-05 DIAGNOSIS — Z940 Kidney transplant status: Secondary | ICD-10-CM | POA: Diagnosis not present

## 2014-11-05 DIAGNOSIS — G4733 Obstructive sleep apnea (adult) (pediatric): Secondary | ICD-10-CM | POA: Diagnosis not present

## 2014-11-05 DIAGNOSIS — E1122 Type 2 diabetes mellitus with diabetic chronic kidney disease: Secondary | ICD-10-CM | POA: Diagnosis not present

## 2014-11-05 DIAGNOSIS — R609 Edema, unspecified: Secondary | ICD-10-CM | POA: Diagnosis not present

## 2014-11-05 DIAGNOSIS — N189 Chronic kidney disease, unspecified: Secondary | ICD-10-CM | POA: Diagnosis not present

## 2014-11-05 DIAGNOSIS — E119 Type 2 diabetes mellitus without complications: Secondary | ICD-10-CM | POA: Diagnosis not present

## 2014-11-05 DIAGNOSIS — R7989 Other specified abnormal findings of blood chemistry: Secondary | ICD-10-CM | POA: Diagnosis not present

## 2014-11-05 DIAGNOSIS — M549 Dorsalgia, unspecified: Secondary | ICD-10-CM | POA: Diagnosis not present

## 2014-11-05 DIAGNOSIS — I129 Hypertensive chronic kidney disease with stage 1 through stage 4 chronic kidney disease, or unspecified chronic kidney disease: Secondary | ICD-10-CM | POA: Diagnosis not present

## 2014-11-05 DIAGNOSIS — B349 Viral infection, unspecified: Secondary | ICD-10-CM | POA: Diagnosis not present

## 2014-11-05 DIAGNOSIS — Z79899 Other long term (current) drug therapy: Secondary | ICD-10-CM | POA: Diagnosis not present

## 2014-11-05 DIAGNOSIS — Z8673 Personal history of transient ischemic attack (TIA), and cerebral infarction without residual deficits: Secondary | ICD-10-CM | POA: Diagnosis not present

## 2014-11-05 DIAGNOSIS — G8929 Other chronic pain: Secondary | ICD-10-CM | POA: Diagnosis not present

## 2014-11-05 DIAGNOSIS — I151 Hypertension secondary to other renal disorders: Secondary | ICD-10-CM | POA: Diagnosis not present

## 2014-11-05 DIAGNOSIS — Z7952 Long term (current) use of systemic steroids: Secondary | ICD-10-CM | POA: Diagnosis not present

## 2014-11-14 ENCOUNTER — Ambulatory Visit (INDEPENDENT_AMBULATORY_CARE_PROVIDER_SITE_OTHER): Payer: Medicare Other | Admitting: *Deleted

## 2014-11-14 DIAGNOSIS — I638 Other cerebral infarction: Secondary | ICD-10-CM | POA: Diagnosis not present

## 2014-11-14 DIAGNOSIS — I6389 Other cerebral infarction: Secondary | ICD-10-CM

## 2014-11-14 NOTE — Progress Notes (Signed)
Carelink Summary Report / Loop recorder 

## 2014-11-19 DIAGNOSIS — Z940 Kidney transplant status: Secondary | ICD-10-CM | POA: Diagnosis not present

## 2014-11-19 DIAGNOSIS — G4733 Obstructive sleep apnea (adult) (pediatric): Secondary | ICD-10-CM | POA: Diagnosis not present

## 2014-11-21 ENCOUNTER — Encounter: Payer: Self-pay | Admitting: Internal Medicine

## 2014-11-25 DIAGNOSIS — M545 Low back pain: Secondary | ICD-10-CM | POA: Diagnosis not present

## 2014-11-25 DIAGNOSIS — Z6835 Body mass index (BMI) 35.0-35.9, adult: Secondary | ICD-10-CM | POA: Diagnosis not present

## 2014-11-25 DIAGNOSIS — I10 Essential (primary) hypertension: Secondary | ICD-10-CM | POA: Diagnosis not present

## 2014-12-02 DIAGNOSIS — E11319 Type 2 diabetes mellitus with unspecified diabetic retinopathy without macular edema: Secondary | ICD-10-CM | POA: Diagnosis not present

## 2014-12-02 DIAGNOSIS — H2513 Age-related nuclear cataract, bilateral: Secondary | ICD-10-CM | POA: Diagnosis not present

## 2014-12-02 DIAGNOSIS — H524 Presbyopia: Secondary | ICD-10-CM | POA: Diagnosis not present

## 2014-12-03 DIAGNOSIS — Z79899 Other long term (current) drug therapy: Secondary | ICD-10-CM | POA: Diagnosis not present

## 2014-12-03 DIAGNOSIS — Z5181 Encounter for therapeutic drug level monitoring: Secondary | ICD-10-CM | POA: Diagnosis not present

## 2014-12-03 DIAGNOSIS — Z940 Kidney transplant status: Secondary | ICD-10-CM | POA: Diagnosis not present

## 2014-12-03 DIAGNOSIS — R7989 Other specified abnormal findings of blood chemistry: Secondary | ICD-10-CM | POA: Diagnosis not present

## 2014-12-03 DIAGNOSIS — Z7982 Long term (current) use of aspirin: Secondary | ICD-10-CM | POA: Diagnosis not present

## 2014-12-03 DIAGNOSIS — B349 Viral infection, unspecified: Secondary | ICD-10-CM | POA: Diagnosis not present

## 2014-12-03 DIAGNOSIS — Z949 Transplanted organ and tissue status, unspecified: Secondary | ICD-10-CM

## 2014-12-03 DIAGNOSIS — Z981 Arthrodesis status: Secondary | ICD-10-CM | POA: Diagnosis not present

## 2014-12-03 DIAGNOSIS — B9789 Other viral agents as the cause of diseases classified elsewhere: Secondary | ICD-10-CM | POA: Diagnosis not present

## 2014-12-03 DIAGNOSIS — I158 Other secondary hypertension: Secondary | ICD-10-CM

## 2014-12-03 DIAGNOSIS — E1122 Type 2 diabetes mellitus with diabetic chronic kidney disease: Secondary | ICD-10-CM | POA: Diagnosis not present

## 2014-12-03 DIAGNOSIS — Z8673 Personal history of transient ischemic attack (TIA), and cerebral infarction without residual deficits: Secondary | ICD-10-CM | POA: Diagnosis not present

## 2014-12-03 DIAGNOSIS — E119 Type 2 diabetes mellitus without complications: Secondary | ICD-10-CM | POA: Diagnosis not present

## 2014-12-03 DIAGNOSIS — G8929 Other chronic pain: Secondary | ICD-10-CM | POA: Diagnosis not present

## 2014-12-03 DIAGNOSIS — M549 Dorsalgia, unspecified: Secondary | ICD-10-CM | POA: Diagnosis not present

## 2014-12-03 DIAGNOSIS — I129 Hypertensive chronic kidney disease with stage 1 through stage 4 chronic kidney disease, or unspecified chronic kidney disease: Secondary | ICD-10-CM | POA: Diagnosis not present

## 2014-12-03 DIAGNOSIS — G4733 Obstructive sleep apnea (adult) (pediatric): Secondary | ICD-10-CM | POA: Diagnosis not present

## 2014-12-03 HISTORY — DX: Other secondary hypertension: Z94.9

## 2014-12-03 HISTORY — DX: Other secondary hypertension: I15.8

## 2014-12-14 LAB — CUP PACEART REMOTE DEVICE CHECK: MDC IDC SESS DTM: 20161104070525

## 2014-12-14 NOTE — Progress Notes (Signed)
Carelink summary report received. Battery status OK. Normal device function. No new symptom episodes, tachy episodes, brady, or pause episodes. No new AF episodes. Monthly summary reports and ROV with JA PRN. 

## 2014-12-15 ENCOUNTER — Ambulatory Visit (INDEPENDENT_AMBULATORY_CARE_PROVIDER_SITE_OTHER): Payer: Medicare Other | Admitting: *Deleted

## 2014-12-15 DIAGNOSIS — I638 Other cerebral infarction: Secondary | ICD-10-CM

## 2014-12-15 DIAGNOSIS — I6389 Other cerebral infarction: Secondary | ICD-10-CM

## 2014-12-15 NOTE — Progress Notes (Signed)
Carelink Summary Report / Loop Recorder 

## 2014-12-17 DIAGNOSIS — N186 End stage renal disease: Secondary | ICD-10-CM | POA: Diagnosis not present

## 2014-12-17 DIAGNOSIS — Z981 Arthrodesis status: Secondary | ICD-10-CM | POA: Diagnosis not present

## 2014-12-17 DIAGNOSIS — Z23 Encounter for immunization: Secondary | ICD-10-CM | POA: Diagnosis not present

## 2014-12-17 DIAGNOSIS — G8929 Other chronic pain: Secondary | ICD-10-CM | POA: Diagnosis not present

## 2014-12-17 DIAGNOSIS — Z792 Long term (current) use of antibiotics: Secondary | ICD-10-CM | POA: Diagnosis not present

## 2014-12-17 DIAGNOSIS — E119 Type 2 diabetes mellitus without complications: Secondary | ICD-10-CM | POA: Diagnosis not present

## 2014-12-17 DIAGNOSIS — Z79899 Other long term (current) drug therapy: Secondary | ICD-10-CM | POA: Diagnosis not present

## 2014-12-17 DIAGNOSIS — E1122 Type 2 diabetes mellitus with diabetic chronic kidney disease: Secondary | ICD-10-CM | POA: Diagnosis not present

## 2014-12-17 DIAGNOSIS — M549 Dorsalgia, unspecified: Secondary | ICD-10-CM | POA: Diagnosis not present

## 2014-12-17 DIAGNOSIS — G4733 Obstructive sleep apnea (adult) (pediatric): Secondary | ICD-10-CM | POA: Diagnosis not present

## 2014-12-17 DIAGNOSIS — I120 Hypertensive chronic kidney disease with stage 5 chronic kidney disease or end stage renal disease: Secondary | ICD-10-CM | POA: Diagnosis not present

## 2014-12-17 DIAGNOSIS — Z7952 Long term (current) use of systemic steroids: Secondary | ICD-10-CM | POA: Diagnosis not present

## 2014-12-17 DIAGNOSIS — Z8673 Personal history of transient ischemic attack (TIA), and cerebral infarction without residual deficits: Secondary | ICD-10-CM | POA: Diagnosis not present

## 2014-12-17 DIAGNOSIS — I158 Other secondary hypertension: Secondary | ICD-10-CM | POA: Diagnosis not present

## 2014-12-17 DIAGNOSIS — Z794 Long term (current) use of insulin: Secondary | ICD-10-CM | POA: Diagnosis not present

## 2014-12-17 DIAGNOSIS — Z940 Kidney transplant status: Secondary | ICD-10-CM | POA: Diagnosis not present

## 2014-12-19 LAB — CUP PACEART REMOTE DEVICE CHECK: MDC IDC SESS DTM: 20161204073537

## 2014-12-19 NOTE — Progress Notes (Signed)
Carelink summary report received. Battery status OK. Normal device function. No new symptom episodes, tachy episodes, brady, or pause episodes. No new AF episodes. Monthly summary reports and PRN w Greggory Brandy

## 2014-12-30 ENCOUNTER — Encounter: Payer: Self-pay | Admitting: *Deleted

## 2015-01-13 ENCOUNTER — Ambulatory Visit (INDEPENDENT_AMBULATORY_CARE_PROVIDER_SITE_OTHER): Payer: Medicare Other | Admitting: *Deleted

## 2015-01-13 DIAGNOSIS — I638 Other cerebral infarction: Secondary | ICD-10-CM | POA: Diagnosis not present

## 2015-01-13 DIAGNOSIS — I6389 Other cerebral infarction: Secondary | ICD-10-CM

## 2015-01-13 NOTE — Progress Notes (Signed)
Carelink Summary Report / Loop Recorder 

## 2015-01-21 DIAGNOSIS — N186 End stage renal disease: Secondary | ICD-10-CM | POA: Diagnosis not present

## 2015-01-21 DIAGNOSIS — Z79899 Other long term (current) drug therapy: Secondary | ICD-10-CM | POA: Diagnosis not present

## 2015-01-21 DIAGNOSIS — Z981 Arthrodesis status: Secondary | ICD-10-CM | POA: Diagnosis not present

## 2015-01-21 DIAGNOSIS — D8989 Other specified disorders involving the immune mechanism, not elsewhere classified: Secondary | ICD-10-CM | POA: Diagnosis not present

## 2015-01-21 DIAGNOSIS — B9789 Other viral agents as the cause of diseases classified elsewhere: Secondary | ICD-10-CM | POA: Diagnosis not present

## 2015-01-21 DIAGNOSIS — Z940 Kidney transplant status: Secondary | ICD-10-CM | POA: Diagnosis not present

## 2015-01-21 DIAGNOSIS — G8929 Other chronic pain: Secondary | ICD-10-CM | POA: Diagnosis not present

## 2015-01-21 DIAGNOSIS — G4733 Obstructive sleep apnea (adult) (pediatric): Secondary | ICD-10-CM | POA: Diagnosis not present

## 2015-01-21 DIAGNOSIS — M549 Dorsalgia, unspecified: Secondary | ICD-10-CM | POA: Diagnosis not present

## 2015-01-21 DIAGNOSIS — Z4822 Encounter for aftercare following kidney transplant: Secondary | ICD-10-CM | POA: Diagnosis not present

## 2015-01-21 DIAGNOSIS — E1122 Type 2 diabetes mellitus with diabetic chronic kidney disease: Secondary | ICD-10-CM | POA: Diagnosis not present

## 2015-01-21 DIAGNOSIS — Z8673 Personal history of transient ischemic attack (TIA), and cerebral infarction without residual deficits: Secondary | ICD-10-CM | POA: Diagnosis not present

## 2015-01-21 DIAGNOSIS — I120 Hypertensive chronic kidney disease with stage 5 chronic kidney disease or end stage renal disease: Secondary | ICD-10-CM | POA: Diagnosis not present

## 2015-02-12 ENCOUNTER — Ambulatory Visit (INDEPENDENT_AMBULATORY_CARE_PROVIDER_SITE_OTHER): Payer: Medicare Other | Admitting: *Deleted

## 2015-02-12 DIAGNOSIS — I6389 Other cerebral infarction: Secondary | ICD-10-CM

## 2015-02-12 DIAGNOSIS — I638 Other cerebral infarction: Secondary | ICD-10-CM

## 2015-02-12 NOTE — Progress Notes (Signed)
Carelink Summary Report / Loop Recorder 

## 2015-02-17 DIAGNOSIS — E1165 Type 2 diabetes mellitus with hyperglycemia: Secondary | ICD-10-CM | POA: Diagnosis not present

## 2015-02-20 DIAGNOSIS — G4733 Obstructive sleep apnea (adult) (pediatric): Secondary | ICD-10-CM | POA: Diagnosis not present

## 2015-02-25 DIAGNOSIS — I10 Essential (primary) hypertension: Secondary | ICD-10-CM | POA: Diagnosis not present

## 2015-02-25 DIAGNOSIS — E1165 Type 2 diabetes mellitus with hyperglycemia: Secondary | ICD-10-CM | POA: Diagnosis not present

## 2015-02-25 DIAGNOSIS — E7800 Pure hypercholesterolemia, unspecified: Secondary | ICD-10-CM | POA: Diagnosis not present

## 2015-02-26 LAB — CUP PACEART REMOTE DEVICE CHECK: MDC IDC SESS DTM: 20170103073614

## 2015-02-26 NOTE — Progress Notes (Signed)
Carelink summary report received. Battery status OK. Normal device function. No new symptom episodes, tachy episodes, brady, or pause episodes. No new AF episodes. Monthly summary reports and ROV/PRN 

## 2015-03-16 ENCOUNTER — Ambulatory Visit (INDEPENDENT_AMBULATORY_CARE_PROVIDER_SITE_OTHER): Payer: Medicare Other | Admitting: *Deleted

## 2015-03-16 DIAGNOSIS — I638 Other cerebral infarction: Secondary | ICD-10-CM | POA: Diagnosis not present

## 2015-03-16 DIAGNOSIS — I6389 Other cerebral infarction: Secondary | ICD-10-CM

## 2015-03-18 DIAGNOSIS — G4733 Obstructive sleep apnea (adult) (pediatric): Secondary | ICD-10-CM | POA: Diagnosis not present

## 2015-03-18 DIAGNOSIS — Z7982 Long term (current) use of aspirin: Secondary | ICD-10-CM | POA: Diagnosis not present

## 2015-03-18 DIAGNOSIS — B349 Viral infection, unspecified: Secondary | ICD-10-CM | POA: Diagnosis not present

## 2015-03-18 DIAGNOSIS — D899 Disorder involving the immune mechanism, unspecified: Secondary | ICD-10-CM | POA: Diagnosis not present

## 2015-03-18 DIAGNOSIS — Z794 Long term (current) use of insulin: Secondary | ICD-10-CM | POA: Diagnosis not present

## 2015-03-18 DIAGNOSIS — I120 Hypertensive chronic kidney disease with stage 5 chronic kidney disease or end stage renal disease: Secondary | ICD-10-CM | POA: Diagnosis not present

## 2015-03-18 DIAGNOSIS — N185 Chronic kidney disease, stage 5: Secondary | ICD-10-CM | POA: Diagnosis not present

## 2015-03-18 DIAGNOSIS — Z8673 Personal history of transient ischemic attack (TIA), and cerebral infarction without residual deficits: Secondary | ICD-10-CM | POA: Diagnosis not present

## 2015-03-18 DIAGNOSIS — I158 Other secondary hypertension: Secondary | ICD-10-CM | POA: Diagnosis not present

## 2015-03-18 DIAGNOSIS — Z23 Encounter for immunization: Secondary | ICD-10-CM | POA: Diagnosis not present

## 2015-03-18 DIAGNOSIS — Z9989 Dependence on other enabling machines and devices: Secondary | ICD-10-CM | POA: Diagnosis not present

## 2015-03-18 DIAGNOSIS — E119 Type 2 diabetes mellitus without complications: Secondary | ICD-10-CM | POA: Diagnosis not present

## 2015-03-18 DIAGNOSIS — E1122 Type 2 diabetes mellitus with diabetic chronic kidney disease: Secondary | ICD-10-CM | POA: Diagnosis not present

## 2015-03-18 DIAGNOSIS — Z888 Allergy status to other drugs, medicaments and biological substances status: Secondary | ICD-10-CM | POA: Diagnosis not present

## 2015-03-18 DIAGNOSIS — Z981 Arthrodesis status: Secondary | ICD-10-CM | POA: Diagnosis not present

## 2015-03-18 DIAGNOSIS — Z79899 Other long term (current) drug therapy: Secondary | ICD-10-CM | POA: Diagnosis not present

## 2015-03-18 DIAGNOSIS — Z940 Kidney transplant status: Secondary | ICD-10-CM | POA: Diagnosis not present

## 2015-03-18 DIAGNOSIS — Z4822 Encounter for aftercare following kidney transplant: Secondary | ICD-10-CM | POA: Diagnosis not present

## 2015-03-20 NOTE — Progress Notes (Signed)
Carelink Summary Report / Loop Recorder 

## 2015-03-21 LAB — CUP PACEART REMOTE DEVICE CHECK: Date Time Interrogation Session: 20170304083544

## 2015-03-21 NOTE — Progress Notes (Signed)
Carelink summary report received. Battery status OK. Normal device function. No new symptom episodes, tachy episodes, brady, or pause episodes. No new AF episodes. Monthly summary reports and ROV/PRN 

## 2015-03-23 DIAGNOSIS — L570 Actinic keratosis: Secondary | ICD-10-CM | POA: Diagnosis not present

## 2015-03-23 DIAGNOSIS — L821 Other seborrheic keratosis: Secondary | ICD-10-CM | POA: Diagnosis not present

## 2015-03-23 DIAGNOSIS — L300 Nummular dermatitis: Secondary | ICD-10-CM | POA: Diagnosis not present

## 2015-03-23 DIAGNOSIS — L578 Other skin changes due to chronic exposure to nonionizing radiation: Secondary | ICD-10-CM | POA: Diagnosis not present

## 2015-04-12 LAB — CUP PACEART REMOTE DEVICE CHECK: MDC IDC SESS DTM: 20170202080641

## 2015-04-12 NOTE — Progress Notes (Signed)
Carelink summary report received. Battery status OK. Normal device function. No new symptom episodes, tachy episodes, brady, or pause episodes. No new AF episodes. Monthly summary reports and ROV/PRN 

## 2015-04-13 ENCOUNTER — Ambulatory Visit (INDEPENDENT_AMBULATORY_CARE_PROVIDER_SITE_OTHER): Payer: Medicare Other | Admitting: *Deleted

## 2015-04-13 DIAGNOSIS — I6389 Other cerebral infarction: Secondary | ICD-10-CM

## 2015-04-13 DIAGNOSIS — I638 Other cerebral infarction: Secondary | ICD-10-CM | POA: Diagnosis not present

## 2015-04-13 NOTE — Progress Notes (Signed)
Carelink Summary Report / Loop Recorder 

## 2015-04-15 DIAGNOSIS — G4733 Obstructive sleep apnea (adult) (pediatric): Secondary | ICD-10-CM | POA: Diagnosis not present

## 2015-04-15 DIAGNOSIS — Z8673 Personal history of transient ischemic attack (TIA), and cerebral infarction without residual deficits: Secondary | ICD-10-CM | POA: Diagnosis not present

## 2015-04-15 DIAGNOSIS — Z940 Kidney transplant status: Secondary | ICD-10-CM | POA: Diagnosis not present

## 2015-04-15 DIAGNOSIS — R600 Localized edema: Secondary | ICD-10-CM | POA: Diagnosis not present

## 2015-04-15 DIAGNOSIS — M549 Dorsalgia, unspecified: Secondary | ICD-10-CM | POA: Diagnosis not present

## 2015-04-15 DIAGNOSIS — G8929 Other chronic pain: Secondary | ICD-10-CM | POA: Diagnosis not present

## 2015-04-15 DIAGNOSIS — I129 Hypertensive chronic kidney disease with stage 1 through stage 4 chronic kidney disease, or unspecified chronic kidney disease: Secondary | ICD-10-CM | POA: Diagnosis not present

## 2015-04-15 DIAGNOSIS — Z7982 Long term (current) use of aspirin: Secondary | ICD-10-CM | POA: Diagnosis not present

## 2015-04-15 DIAGNOSIS — I120 Hypertensive chronic kidney disease with stage 5 chronic kidney disease or end stage renal disease: Secondary | ICD-10-CM | POA: Diagnosis not present

## 2015-04-15 DIAGNOSIS — Z794 Long term (current) use of insulin: Secondary | ICD-10-CM | POA: Diagnosis not present

## 2015-04-15 DIAGNOSIS — Z4822 Encounter for aftercare following kidney transplant: Secondary | ICD-10-CM | POA: Diagnosis not present

## 2015-04-15 DIAGNOSIS — Z79899 Other long term (current) drug therapy: Secondary | ICD-10-CM | POA: Diagnosis not present

## 2015-04-15 DIAGNOSIS — B9789 Other viral agents as the cause of diseases classified elsewhere: Secondary | ICD-10-CM | POA: Diagnosis not present

## 2015-04-15 DIAGNOSIS — B349 Viral infection, unspecified: Secondary | ICD-10-CM | POA: Diagnosis not present

## 2015-04-15 DIAGNOSIS — E0822 Diabetes mellitus due to underlying condition with diabetic chronic kidney disease: Secondary | ICD-10-CM | POA: Diagnosis not present

## 2015-04-15 DIAGNOSIS — D8989 Other specified disorders involving the immune mechanism, not elsewhere classified: Secondary | ICD-10-CM | POA: Diagnosis not present

## 2015-04-15 DIAGNOSIS — E1122 Type 2 diabetes mellitus with diabetic chronic kidney disease: Secondary | ICD-10-CM | POA: Diagnosis not present

## 2015-04-19 ENCOUNTER — Encounter (HOSPITAL_COMMUNITY): Payer: Self-pay | Admitting: Emergency Medicine

## 2015-04-19 ENCOUNTER — Emergency Department (HOSPITAL_COMMUNITY): Payer: Medicare Other

## 2015-04-19 ENCOUNTER — Emergency Department (HOSPITAL_COMMUNITY)
Admission: EM | Admit: 2015-04-19 | Discharge: 2015-04-19 | Disposition: A | Discharge status: Home / Self Care | Payer: Medicare Other | Attending: Emergency Medicine | Admitting: Emergency Medicine

## 2015-04-19 DIAGNOSIS — M47819 Spondylosis without myelopathy or radiculopathy, site unspecified: Secondary | ICD-10-CM | POA: Diagnosis not present

## 2015-04-19 DIAGNOSIS — Z792 Long term (current) use of antibiotics: Secondary | ICD-10-CM | POA: Insufficient documentation

## 2015-04-19 DIAGNOSIS — I129 Hypertensive chronic kidney disease with stage 1 through stage 4 chronic kidney disease, or unspecified chronic kidney disease: Secondary | ICD-10-CM | POA: Diagnosis not present

## 2015-04-19 DIAGNOSIS — Z7952 Long term (current) use of systemic steroids: Secondary | ICD-10-CM | POA: Diagnosis not present

## 2015-04-19 DIAGNOSIS — M19041 Primary osteoarthritis, right hand: Secondary | ICD-10-CM | POA: Insufficient documentation

## 2015-04-19 DIAGNOSIS — G4730 Sleep apnea, unspecified: Secondary | ICD-10-CM | POA: Insufficient documentation

## 2015-04-19 DIAGNOSIS — E669 Obesity, unspecified: Secondary | ICD-10-CM | POA: Diagnosis not present

## 2015-04-19 DIAGNOSIS — K219 Gastro-esophageal reflux disease without esophagitis: Secondary | ICD-10-CM | POA: Diagnosis not present

## 2015-04-19 DIAGNOSIS — Z940 Kidney transplant status: Secondary | ICD-10-CM | POA: Diagnosis not present

## 2015-04-19 DIAGNOSIS — N132 Hydronephrosis with renal and ureteral calculous obstruction: Secondary | ICD-10-CM | POA: Diagnosis not present

## 2015-04-19 DIAGNOSIS — G8929 Other chronic pain: Secondary | ICD-10-CM | POA: Insufficient documentation

## 2015-04-19 DIAGNOSIS — M19042 Primary osteoarthritis, left hand: Secondary | ICD-10-CM | POA: Insufficient documentation

## 2015-04-19 DIAGNOSIS — Z7982 Long term (current) use of aspirin: Secondary | ICD-10-CM | POA: Insufficient documentation

## 2015-04-19 DIAGNOSIS — N200 Calculus of kidney: Secondary | ICD-10-CM | POA: Diagnosis not present

## 2015-04-19 DIAGNOSIS — Z9981 Dependence on supplemental oxygen: Secondary | ICD-10-CM | POA: Insufficient documentation

## 2015-04-19 DIAGNOSIS — Z79899 Other long term (current) drug therapy: Secondary | ICD-10-CM | POA: Diagnosis not present

## 2015-04-19 DIAGNOSIS — Z86010 Personal history of colonic polyps: Secondary | ICD-10-CM | POA: Insufficient documentation

## 2015-04-19 DIAGNOSIS — E785 Hyperlipidemia, unspecified: Secondary | ICD-10-CM | POA: Insufficient documentation

## 2015-04-19 DIAGNOSIS — Z794 Long term (current) use of insulin: Secondary | ICD-10-CM | POA: Diagnosis not present

## 2015-04-19 DIAGNOSIS — R011 Cardiac murmur, unspecified: Secondary | ICD-10-CM | POA: Insufficient documentation

## 2015-04-19 DIAGNOSIS — N189 Chronic kidney disease, unspecified: Secondary | ICD-10-CM | POA: Insufficient documentation

## 2015-04-19 DIAGNOSIS — E119 Type 2 diabetes mellitus without complications: Secondary | ICD-10-CM | POA: Insufficient documentation

## 2015-04-19 DIAGNOSIS — Z8673 Personal history of transient ischemic attack (TIA), and cerebral infarction without residual deficits: Secondary | ICD-10-CM | POA: Insufficient documentation

## 2015-04-19 DIAGNOSIS — R319 Hematuria, unspecified: Secondary | ICD-10-CM | POA: Diagnosis present

## 2015-04-19 LAB — CBC
HEMATOCRIT: 40 % (ref 39.0–52.0)
Hemoglobin: 13 g/dL (ref 13.0–17.0)
MCH: 28.1 pg (ref 26.0–34.0)
MCHC: 32.5 g/dL (ref 30.0–36.0)
MCV: 86.6 fL (ref 78.0–100.0)
PLATELETS: 147 10*3/uL — AB (ref 150–400)
RBC: 4.62 MIL/uL (ref 4.22–5.81)
RDW: 14 % (ref 11.5–15.5)
WBC: 9.1 10*3/uL (ref 4.0–10.5)

## 2015-04-19 LAB — URINE MICROSCOPIC-ADD ON
Bacteria, UA: NONE SEEN
Squamous Epithelial / LPF: NONE SEEN
WBC UA: NONE SEEN WBC/hpf (ref 0–5)

## 2015-04-19 LAB — COMPREHENSIVE METABOLIC PANEL
ALBUMIN: 4.2 g/dL (ref 3.5–5.0)
ALT: 31 U/L (ref 17–63)
AST: 25 U/L (ref 15–41)
Alkaline Phosphatase: 67 U/L (ref 38–126)
Anion gap: 12 (ref 5–15)
BUN: 36 mg/dL — AB (ref 6–20)
CHLORIDE: 104 mmol/L (ref 101–111)
CO2: 23 mmol/L (ref 22–32)
Calcium: 9.5 mg/dL (ref 8.9–10.3)
Creatinine, Ser: 2.39 mg/dL — ABNORMAL HIGH (ref 0.61–1.24)
GFR calc Af Amer: 32 mL/min — ABNORMAL LOW (ref >60)
GFR calc non Af Amer: 27 mL/min — ABNORMAL LOW (ref >60)
GLUCOSE: 177 mg/dL — AB (ref 65–99)
POTASSIUM: 4.5 mmol/L (ref 3.5–5.1)
SODIUM: 139 mmol/L (ref 135–145)
Total Bilirubin: 0.8 mg/dL (ref 0.3–1.2)
Total Protein: 7.5 g/dL (ref 6.5–8.1)

## 2015-04-19 LAB — URINALYSIS, ROUTINE W REFLEX MICROSCOPIC
Bilirubin Urine: NEGATIVE
GLUCOSE, UA: 100 mg/dL — AB
KETONES UR: NEGATIVE mg/dL
LEUKOCYTES UA: NEGATIVE
Nitrite: NEGATIVE
PH: 7.5 (ref 5.0–8.0)
Protein, ur: 30 mg/dL — AB
Specific Gravity, Urine: 1.019 (ref 1.005–1.030)

## 2015-04-19 LAB — CBG MONITORING, ED: Glucose-Capillary: 158 mg/dL — ABNORMAL HIGH (ref 65–99)

## 2015-04-19 LAB — LIPASE, BLOOD: Lipase: 19 U/L (ref 11–51)

## 2015-04-19 MED ORDER — ONDANSETRON HCL 4 MG/2ML IJ SOLN
4.0000 mg | Freq: Once | INTRAMUSCULAR | Status: AC
  Administered 2015-04-19: 4 mg via INTRAVENOUS
  Filled 2015-04-19: qty 2

## 2015-04-19 MED ORDER — SODIUM CHLORIDE 0.9 % IV BOLUS (SEPSIS)
1000.0000 mL | Freq: Once | INTRAVENOUS | Status: AC
  Administered 2015-04-19: 1000 mL via INTRAVENOUS

## 2015-04-19 MED ORDER — OXYCODONE-ACETAMINOPHEN 5-325 MG PO TABS: 2.0000 | ORAL_TABLET | ORAL | Status: DC | PRN

## 2015-04-19 MED ORDER — MORPHINE SULFATE (PF) 4 MG/ML IV SOLN
4.0000 mg | Freq: Once | INTRAVENOUS | Status: AC
  Administered 2015-04-19: 4 mg via INTRAVENOUS
  Filled 2015-04-19: qty 1

## 2015-04-19 NOTE — ED Provider Notes (Signed)
CSN: [id number]     Arrival date & time 04/19/15  0846 History   First MD Initiated Contact with Patient 04/19/15 0901     Chief Complaint  Patient presents with  . Hematuria   HPI Comments: 65 year old male who presents with abdominal pain and hematuria since last night. Wife at bedside. He states he woke up last night to urinate and noticed blood in the toilet bowl. He states it was painless, bright red, and occurred during the beginning of the stream. Reports associated chills, nausea, and RLQ pain which is constant, dull/achy, and worse with movement and radiates to the back. He took 2 Hydrocodone which did not help with the pain. Denies fever, chest pain, SOB, cough, vomiting, diarrhea, constipation, recent urologic procedures, trauma, dysuria, discharge from the penis, clots in urine, hx of hematuria. He is not a smoker. On a daily ASA. Denies blood thinners. 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 Reported baseline SCr is 1.8-2.3 per EMR from [facility]. Denies other abdominal surgeries. Dr. [name] is his Nephrologist here.   Patient is a 65 y.o. male presenting with hematuria.  Hematuria Associated symptoms include abdominal pain, chills and nausea. Pertinent negatives include no chest pain, coughing, fever, rash or vomiting.    Past Medical History  Diagnosis Date  . Hyperlipidemia     takes Lovastatin daily  . Chronic back pain     radiculopathy and stenosis  . History of colon polyps   . H/O hiatal hernia   . Urinary frequency   . Peripheral edema     takes Lasix daily  . Nocturia   . CVA (cerebral infarction) 03/2013  . Stroke (HCC)     speech, rt arm weakness  . Sleep apnea     cpap , study in their home, 09/2102- [employer]           . Hypertension     takes Amlodipine and Catapres daily    dr Forrestine Him, Loop Recorder - [facility]  . Diabetes mellitus      Lantus nightly ;type 2  .  Chronic kidney disease     [name], awaiting Transplant   . GERD (gastroesophageal reflux disease)   . Arthritis     back, hands   . Hx of cardiovascular stress test     a.  Lexiscan Myoview (05/2013):  No ischemia, EF 53%; Normal Study   Past Surgical History  Procedure Laterality Date  . Right leg surgery      pin in place  . Neuroplasty / transposition median nerve at carpal tunnel bilateral    . Right wrist surgery    . Left knee surgery    . Hemorrhoid surgery    . Colonoscopy    . Esophagogastroduodenoscopy    . Loop recorder implant  03/19/13    MDT LinQ implanted by Dr Rayann Heman for cryptogenic stroke  . Tee without cardioversion N/A 03/19/2013    Procedure: TRANSESOPHAGEAL ECHOCARDIOGRAM (TEE);  Surgeon: [name], MD;  Location: [facility];  Service: Cardiovascular;  Laterality: N/A;  . Av fistula placement Left 04/03/2013    Procedure: ARTERIOVENOUS (AV) FISTULA CREATION- LEFT RADIOCEPHALIC VS BRACHIOCEPHALIC;  Surgeon: Conrad Teresita, MD;  Location: [facility];  Service: Vascular;  Laterality: Left;  . Back surgery  12/2012    2003- 1st back surgery & then 2014- fusion  . Av fistula placement Left 05/14/2013  Procedure: LEFT BRACHIOCEPHALIC ARTERIOVENOUS (AV) FISTULA CREATION;  Surgeon: Conrad [place], MD;  Location: [facility];  Service: Vascular;  Laterality: Left;  . Loop recorder implant N/A 03/19/2013    Procedure: LOOP RECORDER IMPLANT;  Surgeon: Coralyn Mark, MD;  Location: [facility] CATH [facility];  Service: Cardiovascular;  Laterality: N/A;  . Kidney transplant  2016  . Nephrectomy transplanted organ     Family History  Problem Relation Age of Onset  . Hypertension Mother   . Hyperlipidemia Father   . Hypertension Father   . Heart disease Father     before age 87  . Diabetes Brother   . Hyperlipidemia Son    Social History  Substance Use Topics  . Smoking status: Former Smoker -- 0.50 packs/day for 40 years    Types: Cigarettes    Quit date: 12/27/2012  . Smokeless  tobacco: Never Used  . Alcohol Use: No    Review of Systems  Constitutional: Positive for chills. Negative for fever.  Respiratory: Negative for cough and shortness of breath.   Cardiovascular: Negative for chest pain.  Gastrointestinal: Positive for nausea and abdominal pain. Negative for vomiting, constipation and blood in stool.  Genitourinary: Positive for frequency, hematuria, flank pain and decreased urine volume. Negative for dysuria, urgency, discharge, penile swelling, scrotal swelling, difficulty urinating, penile pain and testicular pain.  Musculoskeletal: Positive for back pain.  Skin: Negative for rash.  Neurological: Negative for light-headedness.  All other systems reviewed and are negative.   Allergies  Lisinopril; Meloxicam; and Victoza  Home Medications   Prior to Admission medications   Medication Sig Start Date End Date Taking? Authorizing Provider  amLODipine (NORVASC) 10 MG tablet Take 10 mg by mouth daily after breakfast.     Historical Provider, MD  aspirin 81 MG tablet Take 81 mg by mouth daily.    Historical Provider, MD  cloNIDine (CATAPRES) 0.3 MG tablet Take 0.3 mg by mouth 3 (three) times daily.    Historical Provider, MD  cyclobenzaprine (FLEXERIL) 10 MG tablet Take 10 mg by mouth 3 (three) times daily as needed for muscle spasms.    Historical Provider, MD  furosemide (LASIX) 40 MG tablet  07/30/13   Historical Provider, MD  furosemide (LASIX) 80 MG tablet Take 80-160 mg by mouth 2 (two) times daily. 160mg  every am and 80mg  in the pm    Historical Provider, MD  HYDROcodone-acetaminophen (NORCO/VICODIN) 5-325 MG per tablet Take 1 tablet by mouth every 6 (six) hours as needed for moderate pain.    Historical Provider, MD  Insulin Aspart (NOVOLOG [place]) Inject into the skin as directed.    Historical Provider, MD  LANTUS SOLOSTAR 100 UNIT/ML Solostar Pen Inject 33 Units into the skin at bedtime.  05/29/13   Historical Provider, MD  lovastatin (MEVACOR) 40 MG  tablet Take 40 mg by mouth at bedtime.    Historical Provider, MD  mycophenolate (MYFORTIC) 180 MG EC tablet Take 180 mg by mouth 2 (two) times daily.    Historical Provider, MD  omeprazole (PRILOSEC) 20 MG capsule Take 20 mg by mouth daily.    Historical Provider, MD  ONE TOUCH ULTRA TEST test strip  07/16/13   Historical Provider, MD  phosphorus (K PHOS NEUTRAL) 155-852-130 MG tablet Take 250 mg by mouth daily.    Historical Provider, MD  predniSONE (STERAPRED UNI-PAK) 5 MG TABS tablet Take 5 mg by mouth daily.    Historical Provider, MD  sulfamethoxazole-trimethoprim (BACTRIM,SEPTRA) 400-80 MG per tablet Take 1 tablet  by mouth 3 (three) times a week.    Historical Provider, MD  tacrolimus (PROGRAF) 1 MG capsule Take 1 mg by mouth. Three tablets in am and three and half at night.    Historical Provider, MD  tamsulosin (FLOMAX) 0.4 MG CAPS capsule Take 0.4 mg by mouth daily after breakfast.    Historical Provider, MD  valGANciclovir (VALCYTE) 450 MG tablet Take 450 mg by mouth daily.    Historical Provider, MD   BP 160/73 mmHg  Pulse 68  Temp(Src) 97.8 F (36.6 C) (Oral)  Resp 18  Ht 5\' 11"  (1.803 m)  Wt 112.038 kg  BMI 34.46 kg/m2  SpO2 95%   Physical Exam  Constitutional: He is oriented to person, place, and time. He appears well-developed and well-nourished. No distress.  Obese  HENT:  Head: Normocephalic and atraumatic.  Eyes: Conjunctivae are normal. Pupils are equal, round, and reactive to light. Right eye exhibits no discharge. Left eye exhibits no discharge. No scleral icterus.  Neck: Normal range of motion.  Cardiovascular: Normal rate and regular rhythm.  Exam reveals no gallop and no friction rub.   Murmur heard. Soft systolic murmur  Pulmonary/Chest: Effort normal. No respiratory distress. He has no wheezes. He has no rales. He exhibits no tenderness.  Abdominal: Soft. He exhibits no distension and no mass. There is tenderness. There is no rebound and no guarding.  RLQ  tenderness, +Psoas, +Obturator, -Rovsing +CVA tenderness Well healed vertical surgical scar over the RLQ from kidney transplant  Genitourinary:  Chaperone present during exam. No inguinal lymphadenopathy or inguinal hernia noted. Normal circumcised penis free of lesion or rash. Testicles nontender with normal lie. Normal scrotal appearance. No obvious discharge noted   Neurological: He is alert and oriented to person, place, and time.  Skin: Skin is warm and dry.  Psychiatric: He has a normal mood and affect.    ED Course  Procedures (including critical care time) Labs Review Labs Reviewed  COMPREHENSIVE METABOLIC PANEL - Abnormal; Notable for the following:    Glucose, Bld 177 (*)    BUN 36 (*)    Creatinine, Ser 2.39 (*)    GFR calc non Af Amer 27 (*)    GFR calc Af Amer 32 (*)    All other components within normal limits  CBC - Abnormal; Notable for the following:    Platelets 147 (*)    All other components within normal limits  URINALYSIS, ROUTINE W REFLEX MICROSCOPIC (NOT AT [facility]) - Abnormal; Notable for the following:    Glucose, UA 100 (*)    Hgb urine dipstick MODERATE (*)    Protein, ur 30 (*)    All other components within normal limits  CBG MONITORING, ED - Abnormal; Notable for the following:    Glucose-Capillary 158 (*)    All other components within normal limits  LIPASE, BLOOD  URINE MICROSCOPIC-ADD ON    Imaging Review Ct Renal Stone Study  04/19/2015  CLINICAL DATA:  Right lower quadrant pain and hematuria. EXAM: CT ABDOMEN AND PELVIS WITHOUT CONTRAST TECHNIQUE: Multidetector CT imaging of the abdomen and pelvis was performed following the standard protocol without IV contrast. COMPARISON:  None. FINDINGS: Lung bases:  Clear.  Heart normal size. Hepatobiliary: Fatty infiltration of the liver. No liver mass or focal lesion. Gallbladder and biliary tree are unremarkable. Spleen, pancreas, adrenal glands:  Normal. Kidneys, ureters, bladder: Diffuse cortical  thinning of the needed kidneys. Bilateral nonobstructing intrarenal stones. Mild right hydronephrosis. Right ureter is mildly dilated.  There are several right ureteral stones. Two were noted at the level of the pelvic brim. These measure approximately 3 mm each. There is a 5 mm stone in the distal ureter and additional small, 1-2 mm, stones in the distal ureter just above the ureterovesicular junction. No left hydronephrosis or left ureteral stone. No masses of the native kidneys. Right pelvic kidney shows no masses, stones or hydronephrosis. Bladder is unremarkable. Lymph nodes:  No adenopathy. Ascites: None. Gastrointestinal: Stomach and small bowel are unremarkable. Normal colon. Normal appendix. Vascular: Mild ectasia throughout the abdominal aorta with atherosclerotic calcifications. Musculoskeletal: Status post posterior fusion from L3 through S1. Orthopedic hardware is well-seated and aligned. No osteoblastic or osteolytic lesions. IMPRESSION: 1. Mild right hydronephrosis due to several stones along the course of the mid to distal right ureter. 2. Bilateral nonobstructing intrarenal stones in the native kidneys. Diffuse atrophy of the native kidneys. 3. Right pelvic transplant kidney is unremarkable. 4. Hepatic steatosis. Electronically Signed   By: Lajean Manes M.D.   On: 04/19/2015 10:52   I have personally reviewed and evaluated these images and lab results as part of my medical decision-making.   EKG Interpretation None      MDM   Final diagnoses:  Nephrolithiasis   65 year old male who present with acute onset of hematuria and abdominal pain.   CT Renal shows:  1. Mild right hydronephrosis due to several stones along the course of the mid to distal right ureter. 2. Bilateral nonobstructing intrarenal stones in the native kidney. Diffuse atrophy of the native kidney. 3. Right pelvic transplant kidney is unremarkable. 4. Hepatic steatosis.  Morphine, IVF, Zofran started.  Labs show  increased SCr from baseline (2.39). UA is showing a moderate amount of blood. Rest of labs is unremarkable or non-contributory.  Pt states pain is now a 3 after dose of Morphine Shared visit with Dr. Tamera Punt. Consult to urology placed. Spoke with Dr. Jeffie Pollock who recommended outpatient f/u. Pt advised to return to [facility] if he develops fever, nausea, vomiting, uncontrolled pain.  Recardo Evangelist, PA-C 04/19/15 [place], MD 04/19/15 [phone]

## 2015-04-19 NOTE — Discharge Instructions (Signed)
Kidney Stones °Kidney stones (urolithiasis) are deposits that form inside your kidneys. The intense pain is caused by the stone moving through the urinary tract. When the stone moves, the ureter goes into spasm around the stone. The stone is usually passed in the urine.  °CAUSES  °· A disorder that makes certain neck glands produce too much parathyroid hormone (primary hyperparathyroidism). °· A buildup of uric acid crystals, similar to gout in your joints. °· Narrowing (stricture) of the ureter. °· A kidney obstruction present at birth (congenital obstruction). °· Previous surgery on the kidney or ureters. °· Numerous kidney infections. °SYMPTOMS  °· Feeling sick to your stomach (nauseous). °· Throwing up (vomiting). °· Blood in the urine (hematuria). °· Pain that usually spreads (radiates) to the groin. °· Frequency or urgency of urination. °DIAGNOSIS  °· Taking a history and physical exam. °· Blood or urine tests. °· CT scan. °· Occasionally, an examination of the inside of the urinary bladder (cystoscopy) is performed. °TREATMENT  °· Observation. °· Increasing your fluid intake. °· Extracorporeal shock wave lithotripsy--This is a noninvasive procedure that uses shock waves to break up kidney stones. °· Surgery may be needed if you have severe pain or persistent obstruction. There are various surgical procedures. Most of the procedures are performed with the use of small instruments. Only small incisions are needed to accommodate these instruments, so recovery time is minimized. °The size, location, and chemical composition are all important variables that will determine the proper choice of action for you. Talk to your health care provider to better understand your situation so that you will minimize the risk of injury to yourself and your kidney.  °HOME CARE INSTRUCTIONS  °· Drink enough water and fluids to keep your urine clear or pale yellow. This will help you to pass the stone or stone fragments. °· Strain  all urine through the provided strainer. Keep all particulate matter and stones for your health care provider to see. The stone causing the pain may be as small as a grain of salt. It is very important to use the strainer each and every time you pass your urine. The collection of your stone will allow your health care provider to analyze it and verify that a stone has actually passed. The stone analysis will often identify what you can do to reduce the incidence of recurrences. °· Only take over-the-counter or prescription medicines for pain, discomfort, or fever as directed by your health care provider. °· Keep all follow-up visits as told by your health care provider. This is important. °· Get follow-up X-rays if required. The absence of pain does not always mean that the stone has passed. It may have only stopped moving. If the urine remains completely obstructed, it can cause loss of kidney function or even complete destruction of the kidney. It is your responsibility to make sure X-rays and follow-ups are completed. Ultrasounds of the kidney can show blockages and the status of the kidney. Ultrasounds are not associated with any radiation and can be performed easily in a matter of minutes. °· Make changes to your daily diet as told by your health care provider. You may be told to: °¨ Limit the amount of salt that you eat. °¨ Eat 5 or more servings of fruits and vegetables each day. °¨ Limit the amount of meat, poultry, fish, and eggs that you eat. °· Collect a 24-hour urine sample as told by your health care provider. You may need to collect another urine sample every 6-12   months. °SEEK MEDICAL CARE IF: °· You experience pain that is progressive and unresponsive to any pain medicine you have been prescribed. °SEEK IMMEDIATE MEDICAL CARE IF:  °· Pain cannot be controlled with the prescribed medicine. °· You have a fever or shaking chills. °· The severity or intensity of pain increases over 18 hours and is not  relieved by pain medicine. °· You develop a new onset of abdominal pain. °· You feel faint or pass out. °· You are unable to urinate. °  °This information is not intended to replace advice given to you by your health care provider. Make sure you discuss any questions you have with your health care provider. °  °Document Released: 12/27/2004 Document Revised: 09/17/2014 Document Reviewed: 05/30/2012 °Elsevier Interactive Patient Education ©2016 [employer] ° °

## 2015-04-19 NOTE — ED Notes (Addendum)
Hematuria reported last night, (s/p kidney transplant 2015), nausea, chills, no diarrhea/fever, A/O X4, ambulatory and in NAD also c/o RLQ pain

## 2015-04-19 NOTE — ED Notes (Signed)
Patient transported to CT 

## 2015-04-22 DIAGNOSIS — R310 Gross hematuria: Secondary | ICD-10-CM | POA: Diagnosis not present

## 2015-04-22 DIAGNOSIS — N202 Calculus of kidney with calculus of ureter: Secondary | ICD-10-CM | POA: Diagnosis not present

## 2015-04-22 DIAGNOSIS — Z0000 Encounter for general adult medical examination without abnormal findings: Secondary | ICD-10-CM | POA: Diagnosis not present

## 2015-04-22 DIAGNOSIS — N201 Calculus of ureter: Secondary | ICD-10-CM | POA: Diagnosis not present

## 2015-04-22 DIAGNOSIS — N1330 Unspecified hydronephrosis: Secondary | ICD-10-CM | POA: Diagnosis not present

## 2015-04-30 DIAGNOSIS — E119 Type 2 diabetes mellitus without complications: Secondary | ICD-10-CM | POA: Diagnosis not present

## 2015-04-30 DIAGNOSIS — I129 Hypertensive chronic kidney disease with stage 1 through stage 4 chronic kidney disease, or unspecified chronic kidney disease: Secondary | ICD-10-CM | POA: Diagnosis not present

## 2015-04-30 DIAGNOSIS — Z940 Kidney transplant status: Secondary | ICD-10-CM | POA: Diagnosis not present

## 2015-04-30 DIAGNOSIS — B349 Viral infection, unspecified: Secondary | ICD-10-CM | POA: Diagnosis not present

## 2015-04-30 DIAGNOSIS — N200 Calculus of kidney: Secondary | ICD-10-CM | POA: Diagnosis not present

## 2015-05-06 DIAGNOSIS — Z940 Kidney transplant status: Secondary | ICD-10-CM | POA: Diagnosis not present

## 2015-05-06 DIAGNOSIS — N1330 Unspecified hydronephrosis: Secondary | ICD-10-CM | POA: Diagnosis not present

## 2015-05-06 DIAGNOSIS — Z0000 Encounter for general adult medical examination without abnormal findings: Secondary | ICD-10-CM | POA: Diagnosis not present

## 2015-05-06 DIAGNOSIS — N201 Calculus of ureter: Secondary | ICD-10-CM | POA: Diagnosis not present

## 2015-05-06 DIAGNOSIS — R310 Gross hematuria: Secondary | ICD-10-CM | POA: Diagnosis not present

## 2015-05-06 DIAGNOSIS — N200 Calculus of kidney: Secondary | ICD-10-CM | POA: Diagnosis not present

## 2015-05-06 DIAGNOSIS — N202 Calculus of kidney with calculus of ureter: Secondary | ICD-10-CM | POA: Diagnosis not present

## 2015-05-13 ENCOUNTER — Ambulatory Visit (INDEPENDENT_AMBULATORY_CARE_PROVIDER_SITE_OTHER): Payer: Medicare Other | Admitting: *Deleted

## 2015-05-13 DIAGNOSIS — M545 Low back pain: Secondary | ICD-10-CM | POA: Diagnosis not present

## 2015-05-13 DIAGNOSIS — I638 Other cerebral infarction: Secondary | ICD-10-CM

## 2015-05-13 DIAGNOSIS — M961 Postlaminectomy syndrome, not elsewhere classified: Secondary | ICD-10-CM

## 2015-05-13 DIAGNOSIS — I6389 Other cerebral infarction: Secondary | ICD-10-CM

## 2015-05-13 HISTORY — DX: Postlaminectomy syndrome, not elsewhere classified: M96.1

## 2015-05-14 NOTE — Progress Notes (Signed)
Carelink Summary Report / Loop Recorder 

## 2015-05-24 DIAGNOSIS — G4733 Obstructive sleep apnea (adult) (pediatric): Secondary | ICD-10-CM | POA: Diagnosis not present

## 2015-05-25 DIAGNOSIS — L578 Other skin changes due to chronic exposure to nonionizing radiation: Secondary | ICD-10-CM | POA: Diagnosis not present

## 2015-05-25 DIAGNOSIS — L281 Prurigo nodularis: Secondary | ICD-10-CM | POA: Diagnosis not present

## 2015-05-26 DIAGNOSIS — M545 Low back pain: Secondary | ICD-10-CM | POA: Diagnosis not present

## 2015-05-29 DIAGNOSIS — N200 Calculus of kidney: Secondary | ICD-10-CM | POA: Diagnosis not present

## 2015-06-06 LAB — CUP PACEART REMOTE DEVICE CHECK: Date Time Interrogation Session: 20170403083614

## 2015-06-06 NOTE — Progress Notes (Signed)
Carelink summary report received. Battery status OK. Normal device function. No new symptom episodes, tachy episodes, brady, or pause episodes. No new AF episodes. Monthly summary reports and ROV/PRN 

## 2015-06-12 ENCOUNTER — Ambulatory Visit (INDEPENDENT_AMBULATORY_CARE_PROVIDER_SITE_OTHER): Payer: Medicare Other | Admitting: *Deleted

## 2015-06-12 DIAGNOSIS — I638 Other cerebral infarction: Secondary | ICD-10-CM

## 2015-06-12 DIAGNOSIS — I6389 Other cerebral infarction: Secondary | ICD-10-CM

## 2015-06-12 NOTE — Progress Notes (Signed)
Carelink Summary Report / Loop Recorder 

## 2015-06-15 DIAGNOSIS — Z940 Kidney transplant status: Secondary | ICD-10-CM | POA: Diagnosis not present

## 2015-06-15 DIAGNOSIS — E1165 Type 2 diabetes mellitus with hyperglycemia: Secondary | ICD-10-CM | POA: Diagnosis not present

## 2015-06-16 ENCOUNTER — Telehealth: Payer: Self-pay | Admitting: *Deleted

## 2015-06-16 NOTE — Telephone Encounter (Signed)
Called patient regarding a LINQ software update (for false RRT).  Patient sent a manual transmission to receive software update.  Transmission shows no alerts or abnormalities.  Software update successful, battery status: good.  Patient denies any questions or concerns at this time.  He is appreciative of call.

## 2015-06-17 DIAGNOSIS — N200 Calculus of kidney: Secondary | ICD-10-CM | POA: Diagnosis not present

## 2015-06-19 DIAGNOSIS — N200 Calculus of kidney: Secondary | ICD-10-CM | POA: Diagnosis not present

## 2015-06-21 LAB — CUP PACEART REMOTE DEVICE CHECK: MDC IDC SESS DTM: 20170503090513

## 2015-06-21 NOTE — Progress Notes (Signed)
Carelink summary report received. Battery status OK. Normal device function. No new symptom episodes, tachy episodes, brady, or pause episodes. No new AF episodes. Monthly summary reports and ROV/PRN 

## 2015-06-22 DIAGNOSIS — E1165 Type 2 diabetes mellitus with hyperglycemia: Secondary | ICD-10-CM | POA: Diagnosis not present

## 2015-06-22 DIAGNOSIS — Z0000 Encounter for general adult medical examination without abnormal findings: Secondary | ICD-10-CM | POA: Diagnosis not present

## 2015-06-22 DIAGNOSIS — I10 Essential (primary) hypertension: Secondary | ICD-10-CM | POA: Diagnosis not present

## 2015-06-22 DIAGNOSIS — Z1389 Encounter for screening for other disorder: Secondary | ICD-10-CM | POA: Diagnosis not present

## 2015-06-22 DIAGNOSIS — E7800 Pure hypercholesterolemia, unspecified: Secondary | ICD-10-CM | POA: Diagnosis not present

## 2015-06-23 DIAGNOSIS — Z940 Kidney transplant status: Secondary | ICD-10-CM | POA: Diagnosis not present

## 2015-06-23 DIAGNOSIS — E119 Type 2 diabetes mellitus without complications: Secondary | ICD-10-CM | POA: Diagnosis not present

## 2015-06-23 DIAGNOSIS — B349 Viral infection, unspecified: Secondary | ICD-10-CM | POA: Diagnosis not present

## 2015-06-23 DIAGNOSIS — I129 Hypertensive chronic kidney disease with stage 1 through stage 4 chronic kidney disease, or unspecified chronic kidney disease: Secondary | ICD-10-CM | POA: Diagnosis not present

## 2015-06-23 DIAGNOSIS — N200 Calculus of kidney: Secondary | ICD-10-CM | POA: Diagnosis not present

## 2015-07-13 ENCOUNTER — Ambulatory Visit (INDEPENDENT_AMBULATORY_CARE_PROVIDER_SITE_OTHER): Payer: Medicare Other | Admitting: *Deleted

## 2015-07-13 DIAGNOSIS — I6389 Other cerebral infarction: Secondary | ICD-10-CM

## 2015-07-13 DIAGNOSIS — I638 Other cerebral infarction: Secondary | ICD-10-CM | POA: Diagnosis not present

## 2015-07-13 NOTE — Progress Notes (Signed)
Carelink Summary Report / Loop Recorder 

## 2015-07-16 DIAGNOSIS — N2581 Secondary hyperparathyroidism of renal origin: Secondary | ICD-10-CM | POA: Diagnosis not present

## 2015-07-16 DIAGNOSIS — E790 Hyperuricemia without signs of inflammatory arthritis and tophaceous disease: Secondary | ICD-10-CM | POA: Diagnosis not present

## 2015-07-16 DIAGNOSIS — Z940 Kidney transplant status: Secondary | ICD-10-CM | POA: Diagnosis not present

## 2015-07-20 DIAGNOSIS — E876 Hypokalemia: Secondary | ICD-10-CM | POA: Diagnosis not present

## 2015-07-20 DIAGNOSIS — B349 Viral infection, unspecified: Secondary | ICD-10-CM | POA: Diagnosis not present

## 2015-07-20 LAB — CUP PACEART REMOTE DEVICE CHECK
Date Time Interrogation Session: 20170602093544
Date Time Interrogation Session: 20170702103537

## 2015-07-27 DIAGNOSIS — Z940 Kidney transplant status: Secondary | ICD-10-CM | POA: Diagnosis not present

## 2015-07-27 DIAGNOSIS — B349 Viral infection, unspecified: Secondary | ICD-10-CM | POA: Diagnosis not present

## 2015-08-11 ENCOUNTER — Ambulatory Visit (INDEPENDENT_AMBULATORY_CARE_PROVIDER_SITE_OTHER): Payer: Medicare Other | Admitting: *Deleted

## 2015-08-11 DIAGNOSIS — I6389 Other cerebral infarction: Secondary | ICD-10-CM

## 2015-08-11 DIAGNOSIS — E860 Dehydration: Secondary | ICD-10-CM | POA: Diagnosis not present

## 2015-08-11 DIAGNOSIS — I638 Other cerebral infarction: Secondary | ICD-10-CM

## 2015-08-11 NOTE — Progress Notes (Signed)
Carelink Summary Report / Loop Recorder 

## 2015-08-13 DIAGNOSIS — M545 Low back pain: Secondary | ICD-10-CM | POA: Diagnosis not present

## 2015-08-13 DIAGNOSIS — M961 Postlaminectomy syndrome, not elsewhere classified: Secondary | ICD-10-CM | POA: Diagnosis not present

## 2015-08-13 DIAGNOSIS — I129 Hypertensive chronic kidney disease with stage 1 through stage 4 chronic kidney disease, or unspecified chronic kidney disease: Secondary | ICD-10-CM | POA: Diagnosis not present

## 2015-08-20 DIAGNOSIS — Z940 Kidney transplant status: Secondary | ICD-10-CM | POA: Diagnosis not present

## 2015-08-20 LAB — CUP PACEART REMOTE DEVICE CHECK: Date Time Interrogation Session: 20170801103618

## 2015-08-25 DIAGNOSIS — G4733 Obstructive sleep apnea (adult) (pediatric): Secondary | ICD-10-CM | POA: Diagnosis not present

## 2015-08-28 DIAGNOSIS — N200 Calculus of kidney: Secondary | ICD-10-CM | POA: Diagnosis not present

## 2015-08-28 DIAGNOSIS — I129 Hypertensive chronic kidney disease with stage 1 through stage 4 chronic kidney disease, or unspecified chronic kidney disease: Secondary | ICD-10-CM | POA: Diagnosis not present

## 2015-08-28 DIAGNOSIS — Z940 Kidney transplant status: Secondary | ICD-10-CM | POA: Diagnosis not present

## 2015-08-28 DIAGNOSIS — B349 Viral infection, unspecified: Secondary | ICD-10-CM | POA: Diagnosis not present

## 2015-08-28 DIAGNOSIS — E119 Type 2 diabetes mellitus without complications: Secondary | ICD-10-CM | POA: Diagnosis not present

## 2015-09-10 ENCOUNTER — Ambulatory Visit (INDEPENDENT_AMBULATORY_CARE_PROVIDER_SITE_OTHER): Payer: Medicare Other | Admitting: *Deleted

## 2015-09-10 DIAGNOSIS — I6389 Other cerebral infarction: Secondary | ICD-10-CM

## 2015-09-10 DIAGNOSIS — I638 Other cerebral infarction: Secondary | ICD-10-CM | POA: Diagnosis not present

## 2015-09-11 DIAGNOSIS — E785 Hyperlipidemia, unspecified: Secondary | ICD-10-CM | POA: Diagnosis not present

## 2015-09-11 DIAGNOSIS — Z79899 Other long term (current) drug therapy: Secondary | ICD-10-CM | POA: Diagnosis not present

## 2015-09-11 DIAGNOSIS — D510 Vitamin B12 deficiency anemia due to intrinsic factor deficiency: Secondary | ICD-10-CM | POA: Diagnosis not present

## 2015-09-11 DIAGNOSIS — Z23 Encounter for immunization: Secondary | ICD-10-CM | POA: Diagnosis not present

## 2015-09-11 DIAGNOSIS — Z0000 Encounter for general adult medical examination without abnormal findings: Secondary | ICD-10-CM | POA: Diagnosis not present

## 2015-09-11 DIAGNOSIS — D509 Iron deficiency anemia, unspecified: Secondary | ICD-10-CM | POA: Diagnosis not present

## 2015-09-11 NOTE — Progress Notes (Signed)
Carelink Summary Report / Loop Recorder 

## 2015-09-21 DIAGNOSIS — N200 Calculus of kidney: Secondary | ICD-10-CM | POA: Diagnosis not present

## 2015-09-21 DIAGNOSIS — E049 Nontoxic goiter, unspecified: Secondary | ICD-10-CM | POA: Diagnosis not present

## 2015-10-01 ENCOUNTER — Emergency Department (HOSPITAL_COMMUNITY)
Admission: EM | Admit: 2015-10-01 | Discharge: 2015-10-01 | Disposition: A | Discharge status: Home / Self Care | Payer: Medicare Other | Attending: Emergency Medicine | Admitting: Emergency Medicine

## 2015-10-01 ENCOUNTER — Emergency Department (HOSPITAL_COMMUNITY): Payer: Medicare Other

## 2015-10-01 ENCOUNTER — Encounter (HOSPITAL_COMMUNITY): Payer: Self-pay | Admitting: *Deleted

## 2015-10-01 DIAGNOSIS — R1032 Left lower quadrant pain: Secondary | ICD-10-CM | POA: Diagnosis not present

## 2015-10-01 DIAGNOSIS — Z7982 Long term (current) use of aspirin: Secondary | ICD-10-CM | POA: Diagnosis not present

## 2015-10-01 DIAGNOSIS — N200 Calculus of kidney: Secondary | ICD-10-CM | POA: Diagnosis not present

## 2015-10-01 DIAGNOSIS — G8929 Other chronic pain: Secondary | ICD-10-CM | POA: Diagnosis not present

## 2015-10-01 DIAGNOSIS — N186 End stage renal disease: Secondary | ICD-10-CM | POA: Insufficient documentation

## 2015-10-01 DIAGNOSIS — K432 Incisional hernia without obstruction or gangrene: Secondary | ICD-10-CM | POA: Insufficient documentation

## 2015-10-01 DIAGNOSIS — Z940 Kidney transplant status: Secondary | ICD-10-CM | POA: Diagnosis not present

## 2015-10-01 DIAGNOSIS — E1122 Type 2 diabetes mellitus with diabetic chronic kidney disease: Secondary | ICD-10-CM | POA: Diagnosis not present

## 2015-10-01 DIAGNOSIS — Z87891 Personal history of nicotine dependence: Secondary | ICD-10-CM | POA: Diagnosis not present

## 2015-10-01 DIAGNOSIS — Z794 Long term (current) use of insulin: Secondary | ICD-10-CM | POA: Insufficient documentation

## 2015-10-01 DIAGNOSIS — Z8673 Personal history of transient ischemic attack (TIA), and cerebral infarction without residual deficits: Secondary | ICD-10-CM | POA: Diagnosis not present

## 2015-10-01 DIAGNOSIS — M545 Low back pain, unspecified: Secondary | ICD-10-CM

## 2015-10-01 DIAGNOSIS — I120 Hypertensive chronic kidney disease with stage 5 chronic kidney disease or end stage renal disease: Secondary | ICD-10-CM | POA: Diagnosis not present

## 2015-10-01 LAB — COMPREHENSIVE METABOLIC PANEL
ALK PHOS: 74 U/L (ref 38–126)
ALT: 24 U/L (ref 17–63)
ANION GAP: 10 (ref 5–15)
AST: 20 U/L (ref 15–41)
Albumin: 4 g/dL (ref 3.5–5.0)
BILIRUBIN TOTAL: 0.7 mg/dL (ref 0.3–1.2)
BUN: 28 mg/dL — ABNORMAL HIGH (ref 6–20)
CALCIUM: 9.5 mg/dL (ref 8.9–10.3)
CO2: 20 mmol/L — ABNORMAL LOW (ref 22–32)
Chloride: 109 mmol/L (ref 101–111)
Creatinine, Ser: 1.86 mg/dL — ABNORMAL HIGH (ref 0.61–1.24)
GFR calc non Af Amer: 37 mL/min — ABNORMAL LOW (ref >60)
GFR, EST AFRICAN AMERICAN: 42 mL/min — AB (ref >60)
Glucose, Bld: 155 mg/dL — ABNORMAL HIGH (ref 65–99)
Potassium: 5.1 mmol/L (ref 3.5–5.1)
Sodium: 139 mmol/L (ref 135–145)
TOTAL PROTEIN: 7.1 g/dL (ref 6.5–8.1)

## 2015-10-01 LAB — URINALYSIS, ROUTINE W REFLEX MICROSCOPIC
BILIRUBIN URINE: NEGATIVE
Glucose, UA: 100 mg/dL — AB
HGB URINE DIPSTICK: NEGATIVE
Ketones, ur: NEGATIVE mg/dL
Leukocytes, UA: NEGATIVE
Nitrite: NEGATIVE
PROTEIN: 100 mg/dL — AB
Specific Gravity, Urine: 1.024 (ref 1.005–1.030)
pH: 7 (ref 5.0–8.0)

## 2015-10-01 LAB — URINE MICROSCOPIC-ADD ON
RBC / HPF: NONE SEEN RBC/hpf (ref 0–5)
SQUAMOUS EPITHELIAL / LPF: NONE SEEN
WBC, UA: NONE SEEN WBC/hpf (ref 0–5)

## 2015-10-01 LAB — CBC
HCT: 42 % (ref 39.0–52.0)
HEMOGLOBIN: 13.4 g/dL (ref 13.0–17.0)
MCH: 28.3 pg (ref 26.0–34.0)
MCHC: 31.9 g/dL (ref 30.0–36.0)
MCV: 88.8 fL (ref 78.0–100.0)
PLATELETS: 131 10*3/uL — AB (ref 150–400)
RBC: 4.73 MIL/uL (ref 4.22–5.81)
RDW: 13.6 % (ref 11.5–15.5)
WBC: 9.8 10*3/uL (ref 4.0–10.5)

## 2015-10-01 LAB — LIPASE, BLOOD: Lipase: 16 U/L (ref 11–51)

## 2015-10-01 MED ORDER — IOPAMIDOL (ISOVUE-300) INJECTION 61%
INTRAVENOUS | Status: AC
  Filled 2015-10-01: qty 100

## 2015-10-01 MED ORDER — MORPHINE SULFATE (PF) 4 MG/ML IV SOLN
4.0000 mg | Freq: Once | INTRAVENOUS | Status: AC
  Administered 2015-10-01: 4 mg via INTRAMUSCULAR

## 2015-10-01 MED ORDER — MORPHINE SULFATE (PF) 4 MG/ML IV SOLN
4.0000 mg | Freq: Once | INTRAVENOUS | Status: AC
  Administered 2015-10-01: 4 mg via INTRAVENOUS
  Filled 2015-10-01: qty 1

## 2015-10-01 MED ORDER — MORPHINE SULFATE (PF) 4 MG/ML IV SOLN
4.0000 mg | Freq: Once | INTRAVENOUS | Status: DC
  Filled 2015-10-01: qty 1

## 2015-10-01 MED ORDER — IOPAMIDOL (ISOVUE-300) INJECTION 61%
INTRAVENOUS | Status: AC
  Administered 2015-10-01: 30 mL
  Filled 2015-10-01: qty 30

## 2015-10-01 NOTE — ED Notes (Signed)
Patient transported to CT 

## 2015-10-01 NOTE — ED Notes (Signed)
Offered pt pain meds at triage, pt took 2 vicodin pta.

## 2015-10-01 NOTE — Discharge Instructions (Signed)
Please contact urology and inform them of today's visit and all relevant data. Please ask that they review CT scan and findings noted. If you parents any new or worsening signs or symptoms please return to the emergency room immediately for further evaluation.

## 2015-10-01 NOTE — ED Triage Notes (Signed)
Pt reports left side lower back pain since last week, for past two days pain is radiating around to abd. Hx of kidney stones and renal transplant on right side. Pt went to [facility] urology today and had ct scan done and urine was clean. Having n/v today. Denies difficulty urinating.

## 2015-10-01 NOTE — ED Provider Notes (Signed)
Complains of left-sided back pain and left-sided abdominal pain onset 2 days ago. Pain is worse with movement. Improved with remaining still. He's vomited 2 or 3 times today. No nausea present. Presently pain is mild. No urinary symptoms Denies fever admits to chills. On exam alert no distress. Abdomen is tender at left lower quadrant no guarding rigidity or rebound. He is nontender over the transplanted kidney which is at right lower abdomen   Orlie Dakin, MD 10/01/15 1727

## 2015-10-01 NOTE — ED Provider Notes (Signed)
[facility] DEPT Provider Note   CSN: [id number] Arrival date & time: 10/01/15  1023     History   Chief Complaint Chief Complaint  Patient presents with  . Abdominal Pain  . Back Pain    HPI Jon [name] is a 65 y.o. male.  HPI   65 year old male presents today with complaints of abdominal pain and back pain. Patient reports history of chronic back pain located in his lower lumbar region. He reports today's pain feels similar with the exception that this pain has remained persistent over the last 2 days. Patient reports today's pain is located on the left lower lumbar region and described as a dull ache. Patient also noted to have a history kidney stones. He followed up with urology today who did a CT scan without contrast with no noted abnormalities. Unable to obtain these images at the time of patient evaluation. Patient reports the left lower quadrant pain isn't consistent, getting up and moving makes it better, sitting makes it worse. She reports normal bowel movements, denies any fever or chills. He reports one episode of vomiting while in the waiting room. Patient also notes that incisional hernia at the surgical site of his renal transplant. Patient reports last bowel movement was this morning. Reports normal colonoscopy last year with no signs of diverticulosis.   Past Medical History:  Diagnosis Date  . Arthritis    back, hands   . Chronic back pain    radiculopathy and stenosis  . Chronic kidney disease    [name] Transplant   . CVA (cerebral infarction) 03/2013  . Diabetes mellitus     Lantus nightly ;type 2  . GERD (gastroesophageal reflux disease)   . H/O hiatal hernia   . History of colon polyps   . Hx of cardiovascular stress test    a.  Lexiscan Myoview (05/2013):  No ischemia, EF 53%; Normal Study  . Hyperlipidemia    takes Lovastatin daily  . Hypertension    takes Amlodipine and Catapres daily    dr Forrestine Him, Loop Recorder - [facility]    . Nocturia   . Peripheral edema    takes Lasix daily  . Sleep apnea    cpap , study in their home, 09/2102- [facility]           . Stroke (HCC)    speech, rt arm weakness  . Urinary frequency     Patient Active Problem List   Diagnosis Date Noted  . End stage renal disease ([place]) 09/20/2013  . Embolic stroke ([place]) 64/40/3474  . HLD (hyperlipidemia) 05/02/2013  . OSA (obstructive sleep apnea) 05/02/2013  . Pre-operative cardiovascular examination 03/22/2013  . Chronic kidney disease (CKD), stage IV (severe) ([place]) 03/22/2013  . Stroke ([place]) 03/16/2013  . Movement disorder 03/16/2013  . Lumbar stenosis with neurogenic claudication 12/27/2012  . Chronic back pain 01/27/2011  . Tobacco use disorder 01/27/2011  . HTN (hypertension) 01/27/2011  . Chronic kidney disease 01/24/2011  . Hyperkalemia 01/23/2011  . Diabetes mellitus 01/23/2011  . Lower back pain 01/23/2011    Past Surgical History:  Procedure Laterality Date  . AV FISTULA PLACEMENT Left 04/03/2013   Procedure: ARTERIOVENOUS (AV) FISTULA CREATION- LEFT RADIOCEPHALIC VS BRACHIOCEPHALIC;  Surgeon: Conrad [place], MD;  Location: [facility];  Service: Vascular;  Laterality: Left;  . AV FISTULA PLACEMENT Left 05/14/2013   Procedure: LEFT BRACHIOCEPHALIC ARTERIOVENOUS (AV) FISTULA CREATION;  Surgeon: Conrad [place], MD;  Location: [facility];  Service: Vascular;  Laterality: Left;  .  BACK SURGERY  12/2012   2003- 1st back surgery & then 2014- fusion  . COLONOSCOPY    . ESOPHAGOGASTRODUODENOSCOPY    . HEMORRHOID SURGERY    . KIDNEY TRANSPLANT  2016  . left knee surgery    . LOOP RECORDER IMPLANT  03/19/13   MDT LinQ implanted by Dr Rayann Heman for cryptogenic stroke  . LOOP RECORDER IMPLANT N/A 03/19/2013   Procedure: LOOP RECORDER IMPLANT;  Surgeon: Coralyn Mark, MD;  Location: [facility];  Service: Cardiovascular;  Laterality: N/A;  . NEPHRECTOMY TRANSPLANTED ORGAN    . NEUROPLASTY / TRANSPOSITION MEDIAN NERVE AT CARPAL TUNNEL BILATERAL    .  right leg surgery     pin in place  . right wrist surgery    . TEE WITHOUT CARDIOVERSION N/A 03/19/2013   Procedure: TRANSESOPHAGEAL ECHOCARDIOGRAM (TEE);  Surgeon: [name], MD;  Location: [facility] ENDOSCOPY;  Service: Cardiovascular;  Laterality: N/A;       Home Medications    Prior to Admission medications   Medication Sig Start Date End Date Taking? Authorizing Provider  amLODipine (NORVASC) 10 MG tablet Take 5 mg by mouth daily after breakfast.    Yes Historical Provider, MD  aspirin 81 MG tablet Take 81 mg by mouth daily.   Yes Historical Provider, MD  atorvastatin (LIPITOR) 40 MG tablet Take 40 mg by mouth daily.   Yes Historical Provider, MD  furosemide (LASIX) 40 MG tablet Take 40 mg by mouth 2 (two) times daily. Reported on 04/19/2015 07/30/13  Yes Historical Provider, MD  HYDROcodone-acetaminophen (NORCO/VICODIN) 5-325 MG per tablet Take 1-2 tablets by mouth every 6 (six) hours as needed for moderate pain.    Yes Historical Provider, MD  insulin lispro (HUMALOG) 100 UNIT/ML injection Inject 2-16 Units into the skin See admin instructions. Inject 10 units subque 3 times daily with meals and Inject 2-16 units per sliding scale 3 times daily with meals.   Yes Historical Provider, MD  labetalol (NORMODYNE) 300 MG tablet Take 300 mg by mouth 3 (three) times daily.    Yes Historical Provider, MD  LANTUS SOLOSTAR 100 UNIT/ML Solostar Pen Inject 48 Units into the skin at bedtime.  05/29/13  Yes Historical Provider, MD  magnesium oxide (MAG-OX) 400 MG tablet Take 400 mg by mouth daily.   Yes Historical Provider, MD  mycophenolate (MYFORTIC) 180 MG EC tablet Take 180 mg by mouth 2 (two) times daily.   Yes Historical Provider, MD  omeprazole (PRILOSEC) 20 MG capsule Take 20 mg by mouth daily.   Yes Historical Provider, MD  potassium chloride (K-DUR,KLOR-CON) 10 MEQ tablet Take 15 mEq by mouth 3 (three) times daily.   Yes Historical Provider, MD  predniSONE (STERAPRED UNI-PAK) 5 MG TABS tablet  Take 5 mg by mouth daily.   Yes Historical Provider, MD  sulfamethoxazole-trimethoprim (BACTRIM,SEPTRA) 400-80 MG per tablet Take 1 tablet by mouth every Monday, Wednesday, and Friday.    Yes Historical Provider, MD  tacrolimus (PROGRAF) 1 MG capsule Take 3 mg by mouth 2 (two) times daily.    Yes Historical Provider, MD  tamsulosin (FLOMAX) 0.4 MG CAPS capsule Take 0.4 mg by mouth daily after breakfast.   Yes Historical Provider, MD  oxyCODONE-acetaminophen (PERCOCET/ROXICET) 5-325 MG tablet Take 2 tablets by mouth every 4 (four) hours as needed for severe pain. Patient not taking: Reported on 10/01/2015 04/19/15   Recardo Evangelist, PA-C    Family History Family History  Problem Relation Age of Onset  . Hypertension Mother   .  Hyperlipidemia Father   . Hypertension Father   . Heart disease Father     before age 69  . Diabetes Brother   . Hyperlipidemia Son     Social History Social History  Substance Use Topics  . Smoking status: Former Smoker    Packs/day: 0.50    Years: 40.00    Types: Cigarettes    Quit date: 12/27/2012  . Smokeless tobacco: Never Used  . Alcohol use No     Allergies   Lisinopril; Meloxicam; and Victoza [liraglutide]   Review of Systems Review of Systems  All other systems reviewed and are negative.   Physical Exam Updated Vital Signs BP 181/84   Pulse 69   Temp 97.9 F (36.6 C) (Oral)   Resp 18   Ht 5\' 11"  (1.803 m)   Wt 108.4 kg   SpO2 97%   BMI 33.33 kg/m   Physical Exam  Constitutional: He is oriented to person, place, and time. He appears well-developed and well-nourished.  HENT:  Head: Normocephalic and atraumatic.  Eyes: Conjunctivae are normal. Pupils are equal, round, and reactive to light. Right eye exhibits no discharge. Left eye exhibits no discharge. No scleral icterus.  Neck: Normal range of motion. No JVD present. No tracheal deviation present.  Pulmonary/Chest: Effort normal. No stridor.  Abdominal:  Right-sided  abdominal incisional hernia. Left lower quadrant abdominal pain tenderness  Musculoskeletal:  No CT or L-spine tenderness, no tenderness to soft tissues of back. No CVA tenderness  Neurological: He is alert and oriented to person, place, and time. Coordination normal.  Psychiatric: He has a normal mood and affect. His behavior is normal. Judgment and thought content normal.  Nursing note and vitals reviewed.    ED Treatments / Results  Labs (all labs ordered are listed, but only abnormal results are displayed) Labs Reviewed  COMPREHENSIVE METABOLIC PANEL - Abnormal; Notable for the following:       Result Value   CO2 20 (*)    Glucose, Bld 155 (*)    BUN 28 (*)    Creatinine, Ser 1.86 (*)    GFR calc non Af Amer 37 (*)    GFR calc Af Amer 42 (*)    All other components within normal limits  CBC - Abnormal; Notable for the following:    Platelets 131 (*)    All other components within normal limits  URINALYSIS, ROUTINE W REFLEX MICROSCOPIC (NOT AT [facility]) - Abnormal; Notable for the following:    Glucose, UA 100 (*)    Protein, ur 100 (*)    All other components within normal limits  URINE MICROSCOPIC-ADD ON - Abnormal; Notable for the following:    Bacteria, UA RARE (*)    All other components within normal limits  LIPASE, BLOOD    EKG  EKG Interpretation None       Radiology Ct Abdomen Pelvis Wo Contrast  Result Date: 10/01/2015 CLINICAL DATA:  Left lower quadrant pain and nausea and vomiting for 2 days. Nephrolithiasis. Right renal transplant. EXAM: CT ABDOMEN AND PELVIS WITHOUT CONTRAST TECHNIQUE: Multidetector CT imaging of the abdomen and pelvis was performed following the standard protocol without IV contrast. COMPARISON:  10/01/2015 from [facility] and 04/19/2015 FINDINGS: Lower chest: No acute findings. Hepatobiliary: No mass visualized on this unenhanced exam. Gallbladder is unremarkable. Pancreas: No mass or inflammatory process visualized on  this unenhanced exam. Spleen:  Within normal limits in size. Adrenals/Urinary tract: Normal adrenal glands. Atrophy of the native kidneys is  again seen bilaterally. A nonobstructive calculus is seen in the interpolar region of the left kidney measuring 5-6 mm, which is unchanged. A small amount of high attenuation material is again seen within the left renal pelvis which is new since 04/19/2015 exam, suspicious for hemorrhage or infection. There is no evidence of ureteral calculi or hydronephrosis. Renal transplant seen in the right iliac fossa. No evidence of transplant hydronephrosis or perinephric fluid. Unopacified urinary bladder is unremarkable in appearance. Stomach/Bowel: No evidence of obstruction, inflammatory process, or abnormal fluid collections. Normal appendix visualized. Vascular/Lymphatic: No pathologically enlarged lymph nodes identified. No evidence of abdominal aortic aneurysm. Aortic atherosclerosis. Reproductive:  No mass or other significant abnormality. Other:  None. Musculoskeletal: No suspicious bone lesions identified. Lumbar spine fusion hardware and right femoral intramedullary rod again seen. IMPRESSION: Small amount of high attenuation material in the left renal pelvis, possibly due to hemorrhage or infection. Recommend correlation with urinalysis. Nonobstructive left nephrolithiasis. No evidence of ureteral calculi or hydronephrosis. Normal appearance of renal transplant in right iliac fossa. Chronic atrophy of both native kidneys. Aortic atherosclerosis. Electronically Signed   By: Earle Gell M.D.   On: 10/01/2015 20:36    Procedures Procedures (including critical care time)  Medications Ordered in ED Medications  morphine 4 MG/ML injection 4 mg (4 mg Intravenous Given 10/01/15 1613)  iopamidol (ISOVUE-300) 61 % injection (30 mLs  Contrast Given 10/01/15 1742)  morphine 4 MG/ML injection 4 mg (4 mg Intramuscular Given 10/01/15 2137)     Initial Impression / Assessment and  Plan / ED Course  I have reviewed the triage vital signs and the nursing notes.  Pertinent labs & imaging results that were available during my care of the patient were reviewed by me and considered in my medical decision making (see chart for details).  Clinical Course    Final Clinical Impressions(s) / ED Diagnoses   Final diagnoses:  LLQ abdominal pain  Left-sided low back pain without sciatica   Labs:  Imaging:  Consults:  Therapeutics:  Discharge Meds:   Assessment/Plan:  65 year old male presents today with abdominal pain and back pain. Patient is afebrile nontoxic in no acute distress. Patient was seen by urology earlier in the day with CT scan, unable to obtain patient's CT results here. This was noncontrast, concern for diverticulitis here in this patient. CT scan returned showing no acute findings in the abdomen other than prerenal associated. CT results were compared to previous other radiologist, unchanged from earlier CT scan results. Patient's pain significantly improved here in the ED, his findings on CT scan were reviewed by urology earlier in the day with no significant findings that would require further evaluation. Patient is instructed to call his urologist tomorrow and inform him of today's visit and CT scan findings and assure that no findings were significant that he needed further evaluation. Patient has no other findings here today that would necessitate further evaluation or management here in the ED setting. He is given strict return depressions, him and his wife both verbalized understanding and agreement to today's plan.      New Prescriptions Discharge Medication List as of 10/01/2015  9:10 PM       Okey Regal, PA-C 10/03/15 0145    Orlie Dakin, MD 10/05/15 1328

## 2015-10-05 DIAGNOSIS — N200 Calculus of kidney: Secondary | ICD-10-CM | POA: Diagnosis not present

## 2015-10-05 DIAGNOSIS — N19 Unspecified kidney failure: Secondary | ICD-10-CM | POA: Diagnosis not present

## 2015-10-05 DIAGNOSIS — E041 Nontoxic single thyroid nodule: Secondary | ICD-10-CM | POA: Diagnosis not present

## 2015-10-05 LAB — CUP PACEART REMOTE DEVICE CHECK: Date Time Interrogation Session: 20170831110638

## 2015-10-12 ENCOUNTER — Ambulatory Visit (INDEPENDENT_AMBULATORY_CARE_PROVIDER_SITE_OTHER): Payer: Medicare Other | Admitting: *Deleted

## 2015-10-12 DIAGNOSIS — I638 Other cerebral infarction: Secondary | ICD-10-CM | POA: Diagnosis not present

## 2015-10-12 DIAGNOSIS — I6389 Other cerebral infarction: Secondary | ICD-10-CM

## 2015-10-12 NOTE — Progress Notes (Signed)
Carelink Summary Report / Loop Recorder 

## 2015-10-13 DIAGNOSIS — E041 Nontoxic single thyroid nodule: Secondary | ICD-10-CM | POA: Diagnosis not present

## 2015-10-13 HISTORY — PX: THYROID SURGERY: SHX805

## 2015-10-28 DIAGNOSIS — R809 Proteinuria, unspecified: Secondary | ICD-10-CM | POA: Diagnosis not present

## 2015-10-28 DIAGNOSIS — N2581 Secondary hyperparathyroidism of renal origin: Secondary | ICD-10-CM | POA: Diagnosis not present

## 2015-10-28 DIAGNOSIS — I10 Essential (primary) hypertension: Secondary | ICD-10-CM | POA: Diagnosis not present

## 2015-10-28 DIAGNOSIS — B349 Viral infection, unspecified: Secondary | ICD-10-CM | POA: Diagnosis not present

## 2015-10-28 DIAGNOSIS — N184 Chronic kidney disease, stage 4 (severe): Secondary | ICD-10-CM | POA: Diagnosis not present

## 2015-10-28 DIAGNOSIS — Z940 Kidney transplant status: Secondary | ICD-10-CM | POA: Diagnosis not present

## 2015-10-28 DIAGNOSIS — E1129 Type 2 diabetes mellitus with other diabetic kidney complication: Secondary | ICD-10-CM | POA: Diagnosis not present

## 2015-10-29 DIAGNOSIS — N184 Chronic kidney disease, stage 4 (severe): Secondary | ICD-10-CM | POA: Diagnosis not present

## 2015-11-09 ENCOUNTER — Ambulatory Visit (INDEPENDENT_AMBULATORY_CARE_PROVIDER_SITE_OTHER): Payer: Medicare Other | Admitting: *Deleted

## 2015-11-09 DIAGNOSIS — I638 Other cerebral infarction: Secondary | ICD-10-CM | POA: Diagnosis not present

## 2015-11-09 DIAGNOSIS — I6389 Other cerebral infarction: Secondary | ICD-10-CM

## 2015-11-10 DIAGNOSIS — Z79899 Other long term (current) drug therapy: Secondary | ICD-10-CM | POA: Diagnosis not present

## 2015-11-10 NOTE — Progress Notes (Signed)
Carelink Summary Report / Loop Recorder 

## 2015-11-13 DIAGNOSIS — M545 Low back pain: Secondary | ICD-10-CM | POA: Diagnosis not present

## 2015-11-13 DIAGNOSIS — I10 Essential (primary) hypertension: Secondary | ICD-10-CM | POA: Diagnosis not present

## 2015-11-13 DIAGNOSIS — M961 Postlaminectomy syndrome, not elsewhere classified: Secondary | ICD-10-CM | POA: Diagnosis not present

## 2015-11-21 LAB — CUP PACEART REMOTE DEVICE CHECK
Date Time Interrogation Session: 20170930113700
MDC IDC PG IMPLANT DT: 20150310

## 2015-11-21 NOTE — Progress Notes (Signed)
Carelink summary report received. Battery status OK. Normal device function. No new symptom episodes, tachy episodes, brady, or pause episodes. No new AF episodes. Monthly summary reports and ROV/PRN 

## 2015-11-24 DIAGNOSIS — M545 Low back pain: Secondary | ICD-10-CM | POA: Diagnosis not present

## 2015-11-26 DIAGNOSIS — G4733 Obstructive sleep apnea (adult) (pediatric): Secondary | ICD-10-CM | POA: Diagnosis not present

## 2015-12-09 ENCOUNTER — Ambulatory Visit (INDEPENDENT_AMBULATORY_CARE_PROVIDER_SITE_OTHER): Payer: Medicare Other | Admitting: *Deleted

## 2015-12-09 DIAGNOSIS — I6389 Other cerebral infarction: Secondary | ICD-10-CM

## 2015-12-09 DIAGNOSIS — I638 Other cerebral infarction: Secondary | ICD-10-CM | POA: Diagnosis not present

## 2015-12-10 NOTE — Progress Notes (Signed)
Carelink Summary Report / Loop Recorder 

## 2015-12-15 DIAGNOSIS — E1165 Type 2 diabetes mellitus with hyperglycemia: Secondary | ICD-10-CM | POA: Diagnosis not present

## 2015-12-15 LAB — CUP PACEART REMOTE DEVICE CHECK
Implantable Pulse Generator Implant Date: 20150310
MDC IDC SESS DTM: 20171030113716

## 2015-12-15 NOTE — Progress Notes (Signed)
Carelink summary report received. Battery status OK. Normal device function. No new symptom episodes, tachy episodes, brady, or pause episodes. No new AF episodes. Monthly summary reports and ROV/PRN 

## 2015-12-21 DIAGNOSIS — N200 Calculus of kidney: Secondary | ICD-10-CM | POA: Diagnosis not present

## 2015-12-22 ENCOUNTER — Encounter: Payer: Self-pay | Admitting: Internal Medicine

## 2015-12-22 DIAGNOSIS — E7800 Pure hypercholesterolemia, unspecified: Secondary | ICD-10-CM | POA: Diagnosis not present

## 2015-12-22 DIAGNOSIS — D649 Anemia, unspecified: Secondary | ICD-10-CM | POA: Diagnosis not present

## 2015-12-22 DIAGNOSIS — E1165 Type 2 diabetes mellitus with hyperglycemia: Secondary | ICD-10-CM | POA: Diagnosis not present

## 2015-12-22 DIAGNOSIS — I10 Essential (primary) hypertension: Secondary | ICD-10-CM | POA: Diagnosis not present

## 2015-12-22 DIAGNOSIS — D638 Anemia in other chronic diseases classified elsewhere: Secondary | ICD-10-CM | POA: Diagnosis not present

## 2015-12-22 DIAGNOSIS — D696 Thrombocytopenia, unspecified: Secondary | ICD-10-CM | POA: Diagnosis not present

## 2015-12-22 DIAGNOSIS — Z940 Kidney transplant status: Secondary | ICD-10-CM | POA: Diagnosis not present

## 2015-12-31 DIAGNOSIS — R809 Proteinuria, unspecified: Secondary | ICD-10-CM | POA: Diagnosis not present

## 2015-12-31 DIAGNOSIS — I10 Essential (primary) hypertension: Secondary | ICD-10-CM | POA: Diagnosis not present

## 2015-12-31 DIAGNOSIS — E1129 Type 2 diabetes mellitus with other diabetic kidney complication: Secondary | ICD-10-CM | POA: Diagnosis not present

## 2015-12-31 DIAGNOSIS — Z940 Kidney transplant status: Secondary | ICD-10-CM | POA: Diagnosis not present

## 2015-12-31 DIAGNOSIS — N184 Chronic kidney disease, stage 4 (severe): Secondary | ICD-10-CM | POA: Diagnosis not present

## 2016-01-08 ENCOUNTER — Ambulatory Visit (INDEPENDENT_AMBULATORY_CARE_PROVIDER_SITE_OTHER): Payer: Medicare Other | Admitting: *Deleted

## 2016-01-08 DIAGNOSIS — I638 Other cerebral infarction: Secondary | ICD-10-CM | POA: Diagnosis not present

## 2016-01-08 DIAGNOSIS — I6389 Other cerebral infarction: Secondary | ICD-10-CM

## 2016-01-08 NOTE — Progress Notes (Signed)
Carelink Summary Report / Loop Recorder 

## 2016-01-19 DIAGNOSIS — H52223 Regular astigmatism, bilateral: Secondary | ICD-10-CM | POA: Diagnosis not present

## 2016-01-19 DIAGNOSIS — E11319 Type 2 diabetes mellitus with unspecified diabetic retinopathy without macular edema: Secondary | ICD-10-CM | POA: Diagnosis not present

## 2016-01-19 DIAGNOSIS — H2513 Age-related nuclear cataract, bilateral: Secondary | ICD-10-CM | POA: Diagnosis not present

## 2016-01-19 DIAGNOSIS — H353120 Nonexudative age-related macular degeneration, left eye, stage unspecified: Secondary | ICD-10-CM | POA: Diagnosis not present

## 2016-01-19 LAB — CUP PACEART REMOTE DEVICE CHECK
Implantable Pulse Generator Implant Date: 20150310
MDC IDC SESS DTM: 20171129123749

## 2016-02-08 ENCOUNTER — Ambulatory Visit (INDEPENDENT_AMBULATORY_CARE_PROVIDER_SITE_OTHER): Payer: Medicare Other | Admitting: *Deleted

## 2016-02-08 DIAGNOSIS — I6389 Other cerebral infarction: Secondary | ICD-10-CM

## 2016-02-08 DIAGNOSIS — I638 Other cerebral infarction: Secondary | ICD-10-CM

## 2016-02-08 NOTE — Progress Notes (Signed)
Carelink Summary Report / Loop Recorder 

## 2016-02-09 DIAGNOSIS — M545 Low back pain: Secondary | ICD-10-CM | POA: Diagnosis not present

## 2016-02-09 DIAGNOSIS — M961 Postlaminectomy syndrome, not elsewhere classified: Secondary | ICD-10-CM | POA: Diagnosis not present

## 2016-02-09 DIAGNOSIS — I10 Essential (primary) hypertension: Secondary | ICD-10-CM | POA: Diagnosis not present

## 2016-02-22 DIAGNOSIS — N2581 Secondary hyperparathyroidism of renal origin: Secondary | ICD-10-CM | POA: Diagnosis not present

## 2016-02-22 DIAGNOSIS — Z940 Kidney transplant status: Secondary | ICD-10-CM | POA: Diagnosis not present

## 2016-02-22 LAB — CUP PACEART REMOTE DEVICE CHECK
Implantable Pulse Generator Implant Date: 20150310
MDC IDC SESS DTM: 20171229130709

## 2016-02-27 DIAGNOSIS — G4733 Obstructive sleep apnea (adult) (pediatric): Secondary | ICD-10-CM | POA: Diagnosis not present

## 2016-03-04 DIAGNOSIS — N184 Chronic kidney disease, stage 4 (severe): Secondary | ICD-10-CM | POA: Diagnosis not present

## 2016-03-04 DIAGNOSIS — Z940 Kidney transplant status: Secondary | ICD-10-CM | POA: Diagnosis not present

## 2016-03-04 DIAGNOSIS — I10 Essential (primary) hypertension: Secondary | ICD-10-CM | POA: Diagnosis not present

## 2016-03-04 DIAGNOSIS — E1129 Type 2 diabetes mellitus with other diabetic kidney complication: Secondary | ICD-10-CM | POA: Diagnosis not present

## 2016-03-04 DIAGNOSIS — R809 Proteinuria, unspecified: Secondary | ICD-10-CM | POA: Diagnosis not present

## 2016-03-08 ENCOUNTER — Ambulatory Visit (INDEPENDENT_AMBULATORY_CARE_PROVIDER_SITE_OTHER): Payer: Medicare Other | Admitting: *Deleted

## 2016-03-08 DIAGNOSIS — I638 Other cerebral infarction: Secondary | ICD-10-CM

## 2016-03-08 DIAGNOSIS — I6389 Other cerebral infarction: Secondary | ICD-10-CM

## 2016-03-08 LAB — CUP PACEART REMOTE DEVICE CHECK
Date Time Interrogation Session: 20180128133600
MDC IDC PG IMPLANT DT: 20150310

## 2016-03-08 NOTE — Progress Notes (Signed)
Carelink Summary Report / Loop Recorder 

## 2016-03-08 NOTE — Progress Notes (Signed)
Carelink summary report received. Battery status OK. Normal device function. No new symptom episodes, tachy episodes, brady, or pause episodes. No new AF episodes. Monthly summary reports and ROV/PRN 

## 2016-03-09 ENCOUNTER — Encounter: Payer: Self-pay | Admitting: Cardiology

## 2016-03-15 DIAGNOSIS — I10 Essential (primary) hypertension: Secondary | ICD-10-CM | POA: Diagnosis not present

## 2016-03-15 DIAGNOSIS — H2512 Age-related nuclear cataract, left eye: Secondary | ICD-10-CM | POA: Diagnosis not present

## 2016-03-15 DIAGNOSIS — H02839 Dermatochalasis of unspecified eye, unspecified eyelid: Secondary | ICD-10-CM | POA: Diagnosis not present

## 2016-03-15 DIAGNOSIS — H18411 Arcus senilis, right eye: Secondary | ICD-10-CM | POA: Diagnosis not present

## 2016-03-15 DIAGNOSIS — H2513 Age-related nuclear cataract, bilateral: Secondary | ICD-10-CM | POA: Diagnosis not present

## 2016-03-21 DIAGNOSIS — E1165 Type 2 diabetes mellitus with hyperglycemia: Secondary | ICD-10-CM | POA: Diagnosis not present

## 2016-03-25 LAB — CUP PACEART REMOTE DEVICE CHECK
Date Time Interrogation Session: 20180227133954
MDC IDC PG IMPLANT DT: 20150310

## 2016-03-30 DIAGNOSIS — N400 Enlarged prostate without lower urinary tract symptoms: Secondary | ICD-10-CM | POA: Diagnosis not present

## 2016-03-30 DIAGNOSIS — E1143 Type 2 diabetes mellitus with diabetic autonomic (poly)neuropathy: Secondary | ICD-10-CM | POA: Diagnosis not present

## 2016-03-30 DIAGNOSIS — E1165 Type 2 diabetes mellitus with hyperglycemia: Secondary | ICD-10-CM | POA: Diagnosis not present

## 2016-03-30 DIAGNOSIS — G4733 Obstructive sleep apnea (adult) (pediatric): Secondary | ICD-10-CM | POA: Diagnosis not present

## 2016-03-30 DIAGNOSIS — M542 Cervicalgia: Secondary | ICD-10-CM | POA: Diagnosis not present

## 2016-04-04 DIAGNOSIS — M542 Cervicalgia: Secondary | ICD-10-CM | POA: Diagnosis not present

## 2016-04-04 DIAGNOSIS — M5413 Radiculopathy, cervicothoracic region: Secondary | ICD-10-CM | POA: Diagnosis not present

## 2016-04-07 ENCOUNTER — Ambulatory Visit (INDEPENDENT_AMBULATORY_CARE_PROVIDER_SITE_OTHER): Payer: Medicare Other | Admitting: *Deleted

## 2016-04-07 DIAGNOSIS — I638 Other cerebral infarction: Secondary | ICD-10-CM | POA: Diagnosis not present

## 2016-04-07 DIAGNOSIS — M5413 Radiculopathy, cervicothoracic region: Secondary | ICD-10-CM | POA: Diagnosis not present

## 2016-04-07 DIAGNOSIS — I6389 Other cerebral infarction: Secondary | ICD-10-CM

## 2016-04-07 DIAGNOSIS — M542 Cervicalgia: Secondary | ICD-10-CM | POA: Diagnosis not present

## 2016-04-07 NOTE — Progress Notes (Signed)
Carelink Summary Report / Loop Recorder 

## 2016-04-12 DIAGNOSIS — M5413 Radiculopathy, cervicothoracic region: Secondary | ICD-10-CM | POA: Diagnosis not present

## 2016-04-12 DIAGNOSIS — M542 Cervicalgia: Secondary | ICD-10-CM | POA: Diagnosis not present

## 2016-04-14 DIAGNOSIS — M5413 Radiculopathy, cervicothoracic region: Secondary | ICD-10-CM | POA: Diagnosis not present

## 2016-04-14 DIAGNOSIS — M542 Cervicalgia: Secondary | ICD-10-CM | POA: Diagnosis not present

## 2016-04-22 LAB — CUP PACEART REMOTE DEVICE CHECK
Implantable Pulse Generator Implant Date: 20150310
MDC IDC SESS DTM: 20180329140813

## 2016-04-26 DIAGNOSIS — Z940 Kidney transplant status: Secondary | ICD-10-CM | POA: Diagnosis not present

## 2016-05-03 DIAGNOSIS — E1129 Type 2 diabetes mellitus with other diabetic kidney complication: Secondary | ICD-10-CM | POA: Diagnosis not present

## 2016-05-03 DIAGNOSIS — I10 Essential (primary) hypertension: Secondary | ICD-10-CM | POA: Diagnosis not present

## 2016-05-03 DIAGNOSIS — Z940 Kidney transplant status: Secondary | ICD-10-CM | POA: Diagnosis not present

## 2016-05-03 DIAGNOSIS — N184 Chronic kidney disease, stage 4 (severe): Secondary | ICD-10-CM | POA: Diagnosis not present

## 2016-05-03 DIAGNOSIS — R809 Proteinuria, unspecified: Secondary | ICD-10-CM | POA: Diagnosis not present

## 2016-05-05 DIAGNOSIS — M961 Postlaminectomy syndrome, not elsewhere classified: Secondary | ICD-10-CM | POA: Diagnosis not present

## 2016-05-05 DIAGNOSIS — M545 Low back pain: Secondary | ICD-10-CM | POA: Diagnosis not present

## 2016-05-09 ENCOUNTER — Ambulatory Visit (INDEPENDENT_AMBULATORY_CARE_PROVIDER_SITE_OTHER): Payer: Medicare Other | Admitting: *Deleted

## 2016-05-09 DIAGNOSIS — I6389 Other cerebral infarction: Secondary | ICD-10-CM

## 2016-05-09 DIAGNOSIS — H2512 Age-related nuclear cataract, left eye: Secondary | ICD-10-CM | POA: Diagnosis not present

## 2016-05-09 DIAGNOSIS — I638 Other cerebral infarction: Secondary | ICD-10-CM | POA: Diagnosis not present

## 2016-05-09 DIAGNOSIS — H2513 Age-related nuclear cataract, bilateral: Secondary | ICD-10-CM | POA: Diagnosis not present

## 2016-05-09 NOTE — Progress Notes (Signed)
Carelink Summary Report / Loop Recorder 

## 2016-05-10 DIAGNOSIS — H2511 Age-related nuclear cataract, right eye: Secondary | ICD-10-CM | POA: Diagnosis not present

## 2016-05-18 DIAGNOSIS — N184 Chronic kidney disease, stage 4 (severe): Secondary | ICD-10-CM | POA: Diagnosis not present

## 2016-05-21 LAB — CUP PACEART REMOTE DEVICE CHECK
Implantable Pulse Generator Implant Date: 20150310
MDC IDC SESS DTM: 20180428143634

## 2016-05-24 ENCOUNTER — Ambulatory Visit (INDEPENDENT_AMBULATORY_CARE_PROVIDER_SITE_OTHER): Payer: Medicare Other | Admitting: Internal Medicine

## 2016-05-24 ENCOUNTER — Encounter: Payer: Self-pay | Admitting: Internal Medicine

## 2016-05-24 VITALS — BP 118/72 | HR 67 | Ht 71.0 in | Wt 257.4 lb

## 2016-05-24 DIAGNOSIS — G4733 Obstructive sleep apnea (adult) (pediatric): Secondary | ICD-10-CM

## 2016-05-24 DIAGNOSIS — F17200 Nicotine dependence, unspecified, uncomplicated: Secondary | ICD-10-CM

## 2016-05-24 NOTE — Patient Instructions (Signed)
Order- schedule CPAP titration sleep study     Dx OSA  Please call as needed

## 2016-05-24 NOTE — Assessment & Plan Note (Signed)
We have requested original diagnostic sleep study he describes good compliance with CPAP but inadequate control partly reflecting 30 pound weight gain. It sounds as if he thought it was changing the pressure by adjusting the humidifier. Plan-education done. Schedule CPAP titration sleep study so we can document response to the best pressure. I don't think his machine is old enough to need replacement yet, but when able, we will probably change him to auto titration.

## 2016-05-24 NOTE — Assessment & Plan Note (Signed)
He indicates that he completely stopped in 2015

## 2016-05-24 NOTE — Progress Notes (Signed)
05/24/16-66 year old male former smoker Referred by Dr. Lin Landsman for OSA. Had a home sleep study done in 2015. [employer] is DME, already has a CPAP machine. Started out on 2cm but increased the settings to 5cm himself.  Medical problems include DM 2, ESRD/ transplant, GERD, CVA, chronic back pain This gentleman and his wife confirmed a past history of loud snoring and witnessed apneas leading to initial sleep study. We don't have that report yet but was a home study. He describes changing the pressure from 2-5 himself. I think when he was doing was increasing his humidifier setting. He has gained 30 pounds since his sleep study and they both note that he now snores through the machine. He stays sleepy in the daytime especially if sitting quietly Bedtime between 10 PM and 11 PM estimating 30 minutes sleep latency and waking once before up at 8:30 AM. Frequent naps or falls asleep sitting in his chair. No sleep medicines. One or 2 cups of coffee each day CVA in 2015. Kidney transplant. He is not aware of any heart or lung disease diagnosis. Has multinodular goiter Epworth score 10/24  Prior to Admission medications   Medication Sig Start Date End Date Taking? Authorizing Provider  amLODipine (NORVASC) 10 MG tablet Take 5 mg by mouth daily after breakfast.    Yes [provider]  aspirin 81 MG tablet Take 81 mg by mouth daily.   Yes [provider]  atorvastatin (LIPITOR) 40 MG tablet Take 40 mg by mouth daily.   Yes [provider]  furosemide (LASIX) 40 MG tablet Take 40 mg by mouth 2 (two) times daily. Reported on 04/19/2015 07/30/13  Yes [provider]  gabapentin (NEURONTIN) 300 MG capsule Take 300 mg by mouth 2 (two) times daily.   Yes [provider]  HYDROcodone-acetaminophen (NORCO/VICODIN) 5-325 MG per tablet Take 1-2 tablets by mouth every 6 (six) hours as needed for moderate pain.    Yes [provider]  insulin lispro (HUMALOG) 100 UNIT/ML  injection Inject 2-16 Units into the skin See admin instructions. Inject 10 units subque 3 times daily with meals and Inject 2-16 units per sliding scale 3 times daily with meals.   Yes [provider]  labetalol (NORMODYNE) 300 MG tablet Take 300 mg by mouth 3 (three) times daily.    Yes [provider]  LANTUS SOLOSTAR 100 UNIT/ML Solostar Pen Inject 48 Units into the skin at bedtime.  05/29/13  Yes [provider]  magnesium oxide (MAG-OX) 400 MG tablet Take 400 mg by mouth daily.   Yes [provider]  mycophenolate (MYFORTIC) 180 MG EC tablet Take 180 mg by mouth 2 (two) times daily.   Yes [provider]  omeprazole (PRILOSEC) 20 MG capsule Take 20 mg by mouth daily.   Yes [provider]  oxyCODONE-acetaminophen (PERCOCET/ROXICET) 5-325 MG tablet Take 2 tablets by mouth every 4 (four) hours as needed for severe pain. 04/19/15  Yes Recardo Evangelist, PA-C  potassium chloride (K-DUR,KLOR-CON) 10 MEQ tablet Take 15 mEq by mouth 3 (three) times daily.   Yes [provider]  predniSONE (STERAPRED UNI-PAK) 5 MG TABS tablet Take 5 mg by mouth daily.   Yes [provider]  tacrolimus (PROGRAF) 1 MG capsule Take 3 mg by mouth 2 (two) times daily.    Yes [provider]  tamsulosin (FLOMAX) 0.4 MG CAPS capsule Take 0.4 mg by mouth daily after breakfast.   Yes [provider]   Past Medical  History:  Diagnosis Date  . Arthritis    back, hands   . Chronic back pain    radiculopathy and stenosis  . Chronic kidney disease    [name], awaiting Transplant   . CVA (cerebral infarction) 03/2013  . Diabetes mellitus     Lantus nightly ;type 2  . GERD (gastroesophageal reflux disease)   . H/O hiatal hernia   . History of colon polyps   . Hx of cardiovascular stress test    a.  Lexiscan Myoview (05/2013):  No ischemia, EF 53%; Normal Study  . Hyperlipidemia    takes Lovastatin daily  . Hypertension    takes  Amlodipine and Catapres daily    dr Forrestine Him, Loop Recorder - [facility]  . Nocturia   . Peripheral edema    takes Lasix daily  . Sleep apnea    cpap , study in their home, 09/2102- [facility]           . Stroke (HCC)    speech, rt arm weakness  . Urinary frequency    Past Surgical History:  Procedure Laterality Date  . AV FISTULA PLACEMENT Left 04/03/2013   Procedure: ARTERIOVENOUS (AV) FISTULA CREATION- LEFT RADIOCEPHALIC VS BRACHIOCEPHALIC;  Surgeon: Conrad Dundas, MD;  Location: [facility];  Service: Vascular;  Laterality: Left;  . AV FISTULA PLACEMENT Left 05/14/2013   Procedure: LEFT BRACHIOCEPHALIC ARTERIOVENOUS (AV) FISTULA CREATION;  Surgeon: Conrad Beatty, MD;  Location: [facility];  Service: Vascular;  Laterality: Left;  . BACK SURGERY  12/2012   2003- 1st back surgery & then 2014- fusion  . COLONOSCOPY    . ESOPHAGOGASTRODUODENOSCOPY    . HEMORRHOID SURGERY    . KIDNEY TRANSPLANT  2016  . left knee surgery    . LOOP RECORDER IMPLANT  03/19/13   MDT LinQ implanted by Dr Rayann Heman for cryptogenic stroke  . LOOP RECORDER IMPLANT N/A 03/19/2013   Procedure: LOOP RECORDER IMPLANT;  Surgeon: Coralyn Mark, MD;  Location: [facility] CATH LAB;  Service: Cardiovascular;  Laterality: N/A;  . NEPHRECTOMY TRANSPLANTED ORGAN    . NEUROPLASTY / TRANSPOSITION MEDIAN NERVE AT CARPAL TUNNEL BILATERAL    . right leg surgery     pin in place  . right wrist surgery    . TEE WITHOUT CARDIOVERSION N/A 03/19/2013   Procedure: TRANSESOPHAGEAL ECHOCARDIOGRAM (TEE);  Surgeon: [name], MD;  Location: [facility] ENDOSCOPY;  Service: Cardiovascular;  Laterality: N/A;   Family History  Problem Relation Age of Onset  . Hypertension Mother   . Hyperlipidemia Father   . Hypertension Father   . Heart disease Father        before age 64  . Diabetes Brother   . Hyperlipidemia Son    Social History   Social History  . Marital status: Married    Spouse name: agnes  . Number of children: 5  . Years of education: 8th    Occupational History  . disabled    Social History Main Topics  . Smoking status: Former Smoker    Packs/day: 0.50    Years: 40.00    Types: Cigarettes    Quit date: 12/27/2012  . Smokeless tobacco: Never Used  . Alcohol use No  . Drug use: No  . Sexual activity: Yes   Other Topics Concern  . Not on file   Social History Narrative   Patient lives at home with his wife Herbert Pun).   Patient is disabled.   Education 8th grade.   Right  handed.   Caffeine One cup of coffee daily.   ROS-see HPI   Negative unless "+" Constitutional:    weight loss, night sweats, fevers, chills, fatigue, lassitude. HEENT:    headaches, difficulty swallowing, tooth/dental problems, sore throat,       sneezing, itching, ear ache, nasal congestion, post nasal drip, snoring CV:    chest pain, orthopnea, PND, swelling in lower extremities, anasarca,                                                     dizziness, palpitations Resp:   shortness of breath with exertion or at rest.                productive cough,   non-productive cough, coughing up of blood.              change in color of mucus.  wheezing.   Skin:    rash or lesions. GI:  No-   heartburn, indigestion, abdominal pain, nausea, vomiting, diarrhea,                 change in bowel habits, loss of appetite GU: dysuria, change in color of urine, no urgency or frequency.   flank pain. MS:   joint pain, stiffness, decreased range of motion, back pain. Neuro-     nothing unusual Psych:  change in mood or affect.  depression or anxiety.   memory loss.  OBJ- Physical Exam General- Alert, Oriented, Affect-appropriate, Distress- none acute, +overweight Skin- rash-none, lesions- none, excoriation- none Lymphadenopathy- none Head- atraumatic            Eyes- Gross vision intact, PERRLA, conjunctivae and secretions clear            Ears- Hearing, canals-normal            Nose- Clear, no-Septal dev, mucus, polyps, erosion, perforation              Throat- Mallampati IV , mucosa clear , drainage- none, tonsils- atrophic, + full dentures Neck- flexible , trachea midline, no stridor , thyroid nl, carotid no bruit Chest - symmetrical excursion , unlabored           Heart/CV- RRR ,+trace systolic murmur , no gallop  , no rub, nl s1 s2                           - JVD- none , edema- none, stasis changes- none, varices- none           Lung- clear to P&A, wheeze- none, cough- none , dullness-none, rub- none           Chest wall- + loop recorder Abd-  Br/ Gen/ Rectal- Not done, not indicated Extrem- cyanosis- none, clubbing, none, atrophy- none, strength- nl Neuro- grossly intact to observation

## 2016-05-30 DIAGNOSIS — H2513 Age-related nuclear cataract, bilateral: Secondary | ICD-10-CM | POA: Diagnosis not present

## 2016-05-30 DIAGNOSIS — H2511 Age-related nuclear cataract, right eye: Secondary | ICD-10-CM | POA: Diagnosis not present

## 2016-05-30 DIAGNOSIS — G4733 Obstructive sleep apnea (adult) (pediatric): Secondary | ICD-10-CM | POA: Diagnosis not present

## 2016-05-31 ENCOUNTER — Telehealth: Payer: Self-pay | Admitting: *Deleted

## 2016-05-31 NOTE — Telephone Encounter (Signed)
Spoke with Patient regarding LINQ at [facility] since May 18, 2016. Advised patient of next steps, scheduling an appointment with Dr. Rayann Heman to discuss explant or leaving the LINQ implanted and following-up with Dr. Rayann Heman as needed. Patient declined explant at this time. Advised patient we would be sending a return kit in the mail for his [employer] monitor. Patient verbalized understanding. Gave device clinic phone number if they have further questions or concerns.

## 2016-06-07 ENCOUNTER — Encounter: Payer: Medicare Other | Admitting: *Deleted

## 2016-06-07 DIAGNOSIS — S2231XA Fracture of one rib, right side, initial encounter for closed fracture: Secondary | ICD-10-CM | POA: Diagnosis not present

## 2016-06-23 DIAGNOSIS — E1165 Type 2 diabetes mellitus with hyperglycemia: Secondary | ICD-10-CM | POA: Diagnosis not present

## 2016-06-23 DIAGNOSIS — E7800 Pure hypercholesterolemia, unspecified: Secondary | ICD-10-CM | POA: Diagnosis not present

## 2016-06-23 DIAGNOSIS — Z940 Kidney transplant status: Secondary | ICD-10-CM | POA: Diagnosis not present

## 2016-06-28 DIAGNOSIS — S93401A Sprain of unspecified ligament of right ankle, initial encounter: Secondary | ICD-10-CM | POA: Diagnosis not present

## 2016-06-28 DIAGNOSIS — S63413A Traumatic rupture of collateral ligament of left middle finger at metacarpophalangeal and interphalangeal joint, initial encounter: Secondary | ICD-10-CM | POA: Diagnosis not present

## 2016-06-28 DIAGNOSIS — M79645 Pain in left finger(s): Secondary | ICD-10-CM | POA: Diagnosis not present

## 2016-06-28 DIAGNOSIS — M7989 Other specified soft tissue disorders: Secondary | ICD-10-CM | POA: Diagnosis not present

## 2016-06-28 DIAGNOSIS — S72044A Nondisplaced fracture of base of neck of right femur, initial encounter for closed fracture: Secondary | ICD-10-CM | POA: Diagnosis not present

## 2016-06-30 DIAGNOSIS — M7989 Other specified soft tissue disorders: Secondary | ICD-10-CM

## 2016-06-30 DIAGNOSIS — I10 Essential (primary) hypertension: Secondary | ICD-10-CM | POA: Diagnosis not present

## 2016-06-30 DIAGNOSIS — Z0000 Encounter for general adult medical examination without abnormal findings: Secondary | ICD-10-CM | POA: Diagnosis not present

## 2016-06-30 DIAGNOSIS — R2241 Localized swelling, mass and lump, right lower limb: Secondary | ICD-10-CM | POA: Diagnosis not present

## 2016-06-30 DIAGNOSIS — N189 Chronic kidney disease, unspecified: Secondary | ICD-10-CM | POA: Diagnosis not present

## 2016-06-30 DIAGNOSIS — E1165 Type 2 diabetes mellitus with hyperglycemia: Secondary | ICD-10-CM | POA: Diagnosis not present

## 2016-06-30 HISTORY — DX: Other specified soft tissue disorders: M79.89

## 2016-07-01 DIAGNOSIS — E1129 Type 2 diabetes mellitus with other diabetic kidney complication: Secondary | ICD-10-CM | POA: Diagnosis not present

## 2016-07-01 DIAGNOSIS — I10 Essential (primary) hypertension: Secondary | ICD-10-CM | POA: Diagnosis not present

## 2016-07-01 DIAGNOSIS — R809 Proteinuria, unspecified: Secondary | ICD-10-CM | POA: Diagnosis not present

## 2016-07-01 DIAGNOSIS — Z940 Kidney transplant status: Secondary | ICD-10-CM | POA: Diagnosis not present

## 2016-07-01 DIAGNOSIS — N184 Chronic kidney disease, stage 4 (severe): Secondary | ICD-10-CM | POA: Diagnosis not present

## 2016-07-24 ENCOUNTER — Ambulatory Visit (HOSPITAL_BASED_OUTPATIENT_CLINIC_OR_DEPARTMENT_OTHER): Payer: Medicare Other | Attending: Internal Medicine | Admitting: Internal Medicine

## 2016-07-24 VITALS — Ht 71.0 in | Wt 249.0 lb

## 2016-07-24 DIAGNOSIS — G4733 Obstructive sleep apnea (adult) (pediatric): Secondary | ICD-10-CM | POA: Diagnosis not present

## 2016-07-24 DIAGNOSIS — Z794 Long term (current) use of insulin: Secondary | ICD-10-CM | POA: Insufficient documentation

## 2016-07-24 DIAGNOSIS — G4761 Periodic limb movement disorder: Secondary | ICD-10-CM | POA: Insufficient documentation

## 2016-07-30 DIAGNOSIS — G4733 Obstructive sleep apnea (adult) (pediatric): Secondary | ICD-10-CM | POA: Diagnosis not present

## 2016-07-30 NOTE — Procedures (Signed)
     Patient Name: Jon [name], Jon [name] Date: 07/24/2016 Gender: Male D.O.B: 08/21/1950 Age (years): 66 Referring Provider: Baird Lyons MD, ABSM Height (inches): 71 Interpreting Physician: Baird Lyons MD, ABSM Weight (lbs): 249 RPSGT: Madelon Lips BMI: 35 MRN: [id number] Neck Size: 21.00 CLINICAL INFORMATION The patient is referred for a CPAP titration to treat sleep apnea.  Date of NPSG, Split Night or HST:  SLEEP STUDY TECHNIQUE As per the AASM Manual for the Scoring of Sleep and Associated Events v2.3 (April 2016) with a hypopnea requiring 4% desaturations.  The channels recorded and monitored were frontal, central and occipital EEG, electrooculogram (EOG), submentalis EMG (chin), nasal and oral airflow, thoracic and abdominal wall motion, anterior tibialis EMG, snore microphone, electrocardiogram, and pulse oximetry. Continuous positive airway pressure (CPAP) was initiated at the beginning of the study and titrated to treat sleep-disordered breathing.  MEDICATIONS Medications self-administered by patient taken the night of the study : LANTUS  TECHNICIAN COMMENTS Comments added by technician: NO BATHROOM BREAKS TAKEN. PT REPORTED TAKING NIGHT MEDICATION AT 2030  Comments added by scorer: N/A  RESPIRATORY PARAMETERS Optimal PAP Pressure (cm): 14 AHI at Optimal Pressure (/hr): 6.9 Overall Minimal O2 (%): 85.00 Supine % at Optimal Pressure (%): 38 Minimal O2 at Optimal Pressure (%): 88.0    SLEEP ARCHITECTURE The study was initiated at 10:47:37 PM and ended at 4:54:25 AM.  Sleep onset time was 14.2 minutes and the sleep efficiency was 91.6%. The total sleep time was 336.0 minutes.  The patient spent 13.99% of the night in stage N1 sleep, 67.56% in stage N2 sleep, 0.00% in stage N3 and 18.45% in REM.Stage REM latency was 132.0 minutes  Wake after sleep onset was 16.6. Alpha intrusion was absent. Supine sleep was 26.91%.  CARDIAC DATA The 2 lead EKG demonstrated  sinus rhythm. The mean heart rate was 60.96 beats per minute. Other EKG findings include: None.  LEG MOVEMENT DATA The total Periodic Limb Movements of Sleep (PLMS) were 341. The PLMS index was 60.89. A PLMS index of <15 is considered normal in adults.  IMPRESSIONS - The optimal PAP pressure was 14 cm of water. - Central sleep apnea was not noted during this titration (CAI = 0.4/h). - Moderate oxygen desaturations were observed during this titration (min O2 = 85.00%). Mean saturation at final pressure 91.6% - The patient snored with Moderate snoring volume during this titration study. - No cardiac abnormalities were observed during this study. - Severe periodic limb movements were observed during this study. Arousals associated with PLMs were rare.  DIAGNOSIS - Obstructive Sleep Apnea (327.23 [G47.33 ICD-10]) - Periodic Limb Movement Syndrome (327.51 [G47.61 ICD-10])  RECOMMENDATIONS - Trial of CPAP therapy on 14 cm H2O with a Small size Fisher&Paykel Full Face Mask Simplus mask and heated humidification. - Avoid alcohol, sedatives and other CNS depressants that may worsen sleep apnea and disrupt normal sleep architecture. - Sleep hygiene should be reviewed to assess factors that may improve sleep quality. - Weight management and regular exercise should be initiated or continued. -  Consider specific therapy for Periodic Limb Movement  [Electronically signed] 07/30/2016 01:08 PM  Baird Lyons MD, [place], American Board of Sleep Medicine   NPI: [id number]  [place], American Board of Sleep Medicine  ELECTRONICALLY SIGNED ON:  07/30/2016, 1:03 PM [place] PH: [phone]   FX: [phone] [place]

## 2016-08-01 DIAGNOSIS — I10 Essential (primary) hypertension: Secondary | ICD-10-CM | POA: Diagnosis not present

## 2016-08-01 DIAGNOSIS — M545 Low back pain: Secondary | ICD-10-CM | POA: Diagnosis not present

## 2016-08-01 DIAGNOSIS — M961 Postlaminectomy syndrome, not elsewhere classified: Secondary | ICD-10-CM | POA: Diagnosis not present

## 2016-08-24 ENCOUNTER — Other Ambulatory Visit: Payer: Self-pay | Admitting: Internal Medicine

## 2016-08-24 ENCOUNTER — Encounter: Payer: Self-pay | Admitting: Internal Medicine

## 2016-08-24 ENCOUNTER — Ambulatory Visit (INDEPENDENT_AMBULATORY_CARE_PROVIDER_SITE_OTHER): Payer: Medicare Other | Admitting: Internal Medicine

## 2016-08-24 VITALS — BP 110/68 | HR 63 | Ht 71.0 in | Wt 253.6 lb

## 2016-08-24 DIAGNOSIS — G4733 Obstructive sleep apnea (adult) (pediatric): Secondary | ICD-10-CM

## 2016-08-24 DIAGNOSIS — R0609 Other forms of dyspnea: Secondary | ICD-10-CM | POA: Diagnosis not present

## 2016-08-24 DIAGNOSIS — Z7689 Persons encountering health services in other specified circumstances: Secondary | ICD-10-CM | POA: Diagnosis not present

## 2016-08-24 DIAGNOSIS — R0600 Dyspnea, unspecified: Secondary | ICD-10-CM

## 2016-08-24 DIAGNOSIS — Z940 Kidney transplant status: Secondary | ICD-10-CM | POA: Diagnosis not present

## 2016-08-24 HISTORY — DX: Other forms of dyspnea: R06.09

## 2016-08-24 HISTORY — DX: Dyspnea, unspecified: R06.00

## 2016-08-24 NOTE — Progress Notes (Signed)
05/24/16-66 year old male former smoker Referred by Dr. Lin Landsman for OSA. Had a home sleep study done in 2015. [facility] is DME, already has a CPAP machine. Started out on 2cm but increased the settings to 5cm himself.  Medical problems include DM 2, ESRD/ transplant, GERD, CVA, chronic back pain This gentleman and his wife confirmed a past history of loud snoring and witnessed apneas leading to initial sleep study. We don't have that report yet but was a home study. He describes changing the pressure from 2-5 himself. I think when he was doing was increasing his humidifier setting. He has gained 30 pounds since his sleep study and they both note that he now snores through the machine. He stays sleepy in the daytime especially if sitting quietly Bedtime between 10 PM and 11 PM estimating 30 minutes sleep latency and waking once before up at 8:30 AM. Frequent naps or falls asleep sitting in his chair. No sleep medicines. One or 2 cups of coffee each day CVA in 2015. Kidney transplant. He is not aware of any heart or lung disease diagnosis. Has multinodular goiter Epworth score 10/24  08/24/16-65 year old male former smoker followed for OSA, complicated by ESRD/transplant, DM 2, GERD, CVA, chronic back pain Unattended Home Sleep Test 10/24/12-AHI 24/hour, desaturation to 81%. CPAP titration 07/24/16- 14 CWP, severe periodic limb movement syndrome, body weight 249 pounds FOLLOWS FOR: DME  [facility]. Pt currently wears CPAP but had Titration study to help with pressure settings. Download not current but indicates he was using AutoPap capable machine with a peak pressure at 9.2. He says he uses CPAP every night and sleeps much better with it.  He denies routine cough or wheeze but admits dyspnea on exertion with daily activities.  ROS-see HPI   Negative unless "+" Constitutional:    weight loss, night sweats, fevers, chills, fatigue, lassitude. HEENT:    headaches, difficulty swallowing, tooth/dental problems,  sore throat,       sneezing, itching, ear ache, nasal congestion, post nasal drip, snoring CV:    chest pain, orthopnea, PND, swelling in lower extremities, anasarca,                                                     dizziness, palpitations Resp:   +shortness of breath with exertion or at rest.                productive cough,   non-productive cough, coughing up of blood.              change in color of mucus.  wheezing.   Skin:    rash or lesions. GI:  No-   heartburn, indigestion, abdominal pain, nausea, vomiting, diarrhea,                 change in bowel habits, loss of appetite GU: dysuria, change in color of urine, no urgency or frequency.   flank pain. MS:   joint pain, stiffness, decreased range of motion, back pain. Neuro-     nothing unusual Psych:  change in mood or affect.  depression or anxiety.   memory loss.  OBJ- Physical Exam General- Alert, Oriented, Affect-appropriate, Distress- none acute, +overweight Skin- rash-none, lesions- none, excoriation- none Lymphadenopathy- none Head- atraumatic            Eyes- Gross vision intact, PERRLA, conjunctivae and  secretions clear            Ears- Hearing, canals-normal            Nose- Clear, no-Septal dev, mucus, polyps, erosion, perforation             Throat- Mallampati IV , mucosa clear , drainage- none, tonsils- atrophic, + full dentures Neck- flexible , trachea midline, no stridor , thyroid nl, carotid no bruit Chest - symmetrical excursion , unlabored           Heart/CV- RRR , murmur+ 1-2/6 S Ao , no gallop  , no rub, nl s1 s2                           - JVD- none , edema- none, stasis changes- none, varices- none           Lung- clear to P&A, wheeze- none, cough- none , dullness-none, rub- none           Chest wall- + loop recorder Abd-  Br/ Gen/ Rectal- Not done, not indicated Extrem- cyanosis- none, clubbing, none, atrophy- none, strength- nl Neuro- grossly intact to observation

## 2016-08-24 NOTE — Assessment & Plan Note (Signed)
Former smoker with multiple comorbidities including kidney transplant and history CVA. We need to better define his pulmonary status to make sure there is not something we can make better. Plan-PFT. Encourage regular walking to build stamina.

## 2016-08-24 NOTE — Assessment & Plan Note (Signed)
He has been compliant with CPAP and clearly feels he sleeps better with it. He has an AutoPap capable machine. Recent titration study indicates optimum pressure at 14. Plan-[facility] to set CPAP auto 5-20

## 2016-08-24 NOTE — Patient Instructions (Addendum)
We will get a download from [facility] to see what your pressure setting is now. We can continue mask of choice, humidifier, supplies, AirView     Dx OSA  Order   Schedule PFT     Dx    Dyspnea on exertion  I do recommend you try to walk regularly to build your stamina back.l  Ordering CPAP auto 5-20,   Please call if we can help

## 2016-08-29 DIAGNOSIS — Z940 Kidney transplant status: Secondary | ICD-10-CM | POA: Diagnosis not present

## 2016-08-31 DIAGNOSIS — G4733 Obstructive sleep apnea (adult) (pediatric): Secondary | ICD-10-CM | POA: Diagnosis not present

## 2016-09-01 DIAGNOSIS — R809 Proteinuria, unspecified: Secondary | ICD-10-CM | POA: Diagnosis not present

## 2016-09-01 DIAGNOSIS — E1129 Type 2 diabetes mellitus with other diabetic kidney complication: Secondary | ICD-10-CM | POA: Diagnosis not present

## 2016-09-01 DIAGNOSIS — Z940 Kidney transplant status: Secondary | ICD-10-CM | POA: Diagnosis not present

## 2016-09-01 DIAGNOSIS — I10 Essential (primary) hypertension: Secondary | ICD-10-CM | POA: Diagnosis not present

## 2016-09-01 DIAGNOSIS — N184 Chronic kidney disease, stage 4 (severe): Secondary | ICD-10-CM | POA: Diagnosis not present

## 2016-09-05 DIAGNOSIS — R209 Unspecified disturbances of skin sensation: Secondary | ICD-10-CM | POA: Diagnosis not present

## 2016-09-05 DIAGNOSIS — I259 Chronic ischemic heart disease, unspecified: Secondary | ICD-10-CM | POA: Diagnosis not present

## 2016-09-05 DIAGNOSIS — I10 Essential (primary) hypertension: Secondary | ICD-10-CM | POA: Diagnosis not present

## 2016-09-09 ENCOUNTER — Encounter: Payer: Self-pay | Admitting: Neurology

## 2016-09-28 ENCOUNTER — Ambulatory Visit (INDEPENDENT_AMBULATORY_CARE_PROVIDER_SITE_OTHER): Payer: Medicare Other | Admitting: Neurology

## 2016-09-28 ENCOUNTER — Other Ambulatory Visit: Payer: Medicare Other

## 2016-09-28 ENCOUNTER — Encounter: Payer: Self-pay | Admitting: Neurology

## 2016-09-28 VITALS — BP 100/70 | HR 68 | Ht 71.0 in | Wt 256.1 lb

## 2016-09-28 DIAGNOSIS — R202 Paresthesia of skin: Secondary | ICD-10-CM

## 2016-09-28 DIAGNOSIS — E1142 Type 2 diabetes mellitus with diabetic polyneuropathy: Secondary | ICD-10-CM | POA: Diagnosis not present

## 2016-09-28 DIAGNOSIS — R209 Unspecified disturbances of skin sensation: Secondary | ICD-10-CM | POA: Diagnosis not present

## 2016-09-28 DIAGNOSIS — G8220 Paraplegia, unspecified: Secondary | ICD-10-CM | POA: Diagnosis not present

## 2016-09-28 LAB — TSH: TSH: 1.95 u[IU]/mL (ref 0.35–4.50)

## 2016-09-28 LAB — C-REACTIVE PROTEIN: CRP: 0.4 mg/dL — AB (ref 0.5–20.0)

## 2016-09-28 LAB — SEDIMENTATION RATE: SED RATE: 18 mm/h (ref 0–20)

## 2016-09-28 LAB — VITAMIN B12

## 2016-09-28 NOTE — Progress Notes (Signed)
[facility] Division Clinic Note - Initial Visit   Date: 09/28/16  Jon [name] MRN: [id number] DOB: 29-Aug-1950   Dear Dr. Lin Landsman:  Thank you for your kind referral of Jon [name] for consultation of facial paresthesias. Although his history is well known to you, please allow [place] to reiterate it for the purpose of our medical record. The patient was accompanied to the clinic by wife who also provides collateral information.     History of Present Illness: Jon [name] is a 66 y.o. right-handed Caucasian male with CKD s/p kidney transplant (2015), Jon mellitus type II (HbA1c 6.7), Jon [name], Jon [name], Jon [name], Jon [name], Jon residual deficits) presenting for evaluation of left facial paresthesias.    Starting around early summer 2018, he started having intermittent tingling sensation over the left temporal region and radiates into the cheek and rarely into his lower chin and neck.  Symptoms last a few minutes and can occur 2-3 times per day, but cannot occur for a week. He has noticed similar symptoms on the right side of the face, but it is not often.  He denies any facial weakness, change in taste, headaches, or facial pain.    He has history of stroke in March 2015 manifesting with R sided numbness and aphasia and given IVtPA.  MRI showed L MCA small cortical infarct, Jon large vessel stenosis.  TTE and TEE was normal, Jon PFO.  Loop recorder was implanted which has not shown any arrhthymias, thus far.  He does not have any residual deficits from his stroke.  Of note, he has history of childhood poliomyelitis which was diagnosed much later in life.  He recalls being in a wheelchair age that age of 32 for 3 months but was not given a diagnosis.  When he was undergoing evaluation for lumbar surgery, polio was raised because of his lower leg weakness and atrophy, both which have been long standing since his childhood.  Out-side paper records,  electronic medical record, and images have been reviewed where available and summarized as:  MRI/A brain 03/16/2013: 1. Acute left MCA infarct. 2. Variant intracranial arterial anatomy as above. Jon evidence of major intracranial arterial occlusion.  Lab Results  Component Value Date   CHOL 116 03/16/2013   HDL 34 (L) 03/16/2013   LDLCALC 62 03/16/2013   TRIG 98 03/16/2013   CHOLHDL 3.4 03/16/2013     Past Medical History:  Diagnosis Date  . Arthritis    back, hands   . Chronic back pain    radiculopathy and stenosis  . Chronic kidney disease    [name], awaiting Transplant   . CVA (cerebral infarction) 03/2013  . Jon mellitus     Lantus nightly ;type 2  . GERD (gastroesophageal reflux disease)   . H/O hiatal hernia   . History of colon polyps   . Hx of cardiovascular stress test    a.  Lexiscan Myoview (05/2013):  Jon ischemia, EF 53%; Normal Study  . Jon [name]    takes Lovastatin daily  . Jon [name]    takes Amlodipine and Catapres daily    dr Forrestine Him, Loop Recorder - [facility]  . Nocturia   . Peripheral edema    takes Lasix daily  . Sleep apnea    cpap , study in their home, 09/2102- [facility]           . Stroke (HCC)    speech, rt arm weakness  . Urinary frequency     Past Surgical  History:  Procedure Laterality Date  . AV FISTULA PLACEMENT Left 04/03/2013   Procedure: ARTERIOVENOUS (AV) FISTULA CREATION- LEFT RADIOCEPHALIC VS BRACHIOCEPHALIC;  Surgeon: Conrad Garysburg, MD;  Location: [facility];  Service: Vascular;  Laterality: Left;  . AV FISTULA PLACEMENT Left 05/14/2013   Procedure: LEFT BRACHIOCEPHALIC ARTERIOVENOUS (AV) FISTULA CREATION;  Surgeon: Conrad Franklin, MD;  Location: [facility];  Service: Vascular;  Laterality: Left;  . BACK SURGERY  12/2012   2003- 1st back surgery & then 2014- fusion  . COLONOSCOPY    . ESOPHAGOGASTRODUODENOSCOPY    . HEMORRHOID SURGERY    . KIDNEY TRANSPLANT  2016  . left knee surgery    . LOOP RECORDER IMPLANT  03/19/13    MDT LinQ implanted by Dr Rayann Heman for Jon stroke  . LOOP RECORDER IMPLANT N/A 03/19/2013   Procedure: LOOP RECORDER IMPLANT;  Surgeon: Coralyn Mark, MD;  Location: [facility] CATH LAB;  Service: Cardiovascular;  Laterality: N/A;  . NEPHRECTOMY TRANSPLANTED ORGAN    . NEUROPLASTY / TRANSPOSITION MEDIAN NERVE AT CARPAL TUNNEL BILATERAL    . right leg surgery     pin in place  . right wrist surgery    . TEE WITHOUT CARDIOVERSION N/A 03/19/2013   Procedure: TRANSESOPHAGEAL ECHOCARDIOGRAM (TEE);  Surgeon: [name], MD;  Location: [facility] ENDOSCOPY;  Service: Cardiovascular;  Laterality: N/A;     Medications:  Outpatient Encounter Prescriptions as of 09/28/2016  Medication Sig  . amLODipine (NORVASC) 10 MG tablet Take 5 mg by mouth daily after breakfast.   . aspirin 81 MG tablet Take 81 mg by mouth daily.  [name] atorvastatin (LIPITOR) 40 MG tablet Take 40 mg by mouth daily.  . Blood Glucose Monitoring Suppl (ONE TOUCH ULTRA 2) w/Device KIT   . calcitRIOL (ROCALTROL) 0.25 MCG capsule TAKE 1 CAPSULE 5 TIMES PER WEEK  . furosemide (LASIX) 80 MG tablet Take 2 tablet and 1 tablet evening  . gabapentin (NEURONTIN) 300 MG capsule Take 300 mg by mouth 2 (two) times daily.  [name] HUMALOG KWIKPEN 100 UNIT/ML KiwkPen INJECT 10 UNITS INTO THE SKIN 3 TIMES DAILY WITH MEALS  INJECT ADDITIONAL 2 UNITS FOR EVERY 25 POINTS BLOOD GLUCOSE GREATER THAN 150  . HYDROcodone-acetaminophen (NORCO/VICODIN) 5-325 MG per tablet Take 1-2 tablets by mouth every 6 (six) hours as needed for moderate pain.   [name] insulin lispro (HUMALOG) 100 UNIT/ML injection Inject 2-16 Units into the skin See admin instructions. Inject 10 units subque 3 times daily with meals and Inject 2-16 units per sliding scale 3 times daily with meals.  [name] labetalol (NORMODYNE) 300 MG tablet Take 300 mg by mouth 3 (three) times daily.   [name] LANTUS SOLOSTAR 100 UNIT/ML Solostar Pen Inject 48 Units into the skin at bedtime.   . magnesium oxide (MAG-OX) 400 MG tablet  Take 400 mg by mouth daily.  . Multiple Vitamin (MULTIVITAMIN) tablet Take 1 tablet by mouth daily.  . mycophenolate (MYFORTIC) 180 MG EC tablet Take 180 mg by mouth 2 (two) times daily.  [name] omeprazole (PRILOSEC) 20 MG capsule Take 20 mg by mouth daily.  . potassium chloride (K-DUR,KLOR-CON) 10 MEQ tablet Take 15 mEq by mouth 3 (three) times daily.  . Potassium Citrate 15 MEQ (1620 MG) TBCR   . predniSONE (STERAPRED UNI-PAK) 5 MG TABS tablet Take 5 mg by mouth daily.  . tacrolimus (PROGRAF) 1 MG capsule Take 3 mg by mouth 2 (two) times daily.   . tamsulosin (FLOMAX) 0.4 MG CAPS capsule Take 0.4 mg  by mouth daily after breakfast.  . vitamin B-12 (CYANOCOBALAMIN) 1000 MCG tablet Take 2,000 mcg by mouth daily.   Jon facility-administered encounter medications on file as of 09/28/2016.      Allergies:  Allergies  Allergen Reactions  . Lisinopril Other (See Comments)    Increased potassium level   . Meloxicam Nausea And Vomiting  . Victoza [Liraglutide] Diarrhea, Nausea And Vomiting and Swelling    Family History: Family History  Problem Relation Age of Onset  . Jon [name] Mother   . Jon [name] Father   . Jon [name] Father   . Heart disease Father        before age 14  . Jon Brother   . Jon [name] Son     Social History: Social History  Substance Use Topics  . Smoking status: Former Smoker    Packs/day: 0.50    Years: 40.00    Types: Cigarettes    Quit date: 12/27/2012  . Smokeless tobacco: Never Used  . Alcohol use Jon   Social History   Social History Narrative   Patient lives at home with his wife Herbert Pun).   Patient is disabled.   Education 8th grade.   Right handed.   Caffeine One cup of coffee daily.    Review of Systems:  CONSTITUTIONAL: Jon fevers, chills, night sweats, or weight loss.   EYES: Jon visual changes or eye pain ENT: Jon hearing changes.  Jon history of nose bleeds.   RESPIRATORY: Jon cough, wheezing and shortness of breath.     CARDIOVASCULAR: Negative for chest pain, and palpitations.   GI: Negative for abdominal discomfort, blood in stools or black stools.  Jon recent change in bowel habits.   GU:  Jon history of incontinence.   MUSCLOSKELETAL: Jon history of joint pain or swelling.  Jon myalgias.   SKIN: Negative for lesions, rash, and itching.   HEMATOLOGY/ONCOLOGY: Negative for prolonged bleeding, bruising easily, and swollen nodes.    ENDOCRINE: Negative for cold or heat intolerance, polydipsia or goiter.   PSYCH:  Jon depression or anxiety symptoms.   NEURO: As Above.   Vital Signs:  BP 100/70   Pulse 68   Ht _0  (1.803 m)   Wt 256 lb 1 oz (116.1 kg)   SpO2 94%   BMI 35.71 kg/m    General Medical Exam:   General:  Well appearing, comfortable.   Eyes/ENT: see cranial nerve examination.   Neck: Jon masses appreciated.  Full range of motion without tenderness.  Jon carotid bruits. Respiratory:  Clear to auscultation, good air entry bilaterally.   Cardiac:  Regular rate and rhythm, Jon murmur.   Extremities:  High arches, Jon edema, or skin discoloration.  Skin:  Jon rashes or lesions.  Neurological Exam: MENTAL STATUS including orientation to time, place, person, recent and remote memory, attention span and concentration, language, and fund of knowledge is normal.  Speech is not dysarthric.  CRANIAL NERVES: II:  Jon visual field defects.  Unremarkable fundi.   III-IV-VI: Pupils equal round and reactive to light.  Normal conjugate, extra-ocular eye movements in all directions of gaze.  Jon nystagmus.  Jon ptosis.   V:  Normal facial sensation to pin prick and temperature.   VII:  Normal facial symmetry and movements.  Jon pathologic facial reflexes.  VIII:  Normal hearing and vestibular function.   IX-X:  Normal palatal movement.   XI:  Normal shoulder shrug and head rotation.   XII:  Normal tongue strength and range of  motion, Jon deviation or fasciculation.  MOTOR:  Moderate atrophy of the lower legs  (TA and gastroc).  Jon fasciculations or abnormal movements.  Jon pronator drift.  Tone is normal.    Right Upper Extremity:    Left Upper Extremity:    Deltoid  5/5   Deltoid  5/5   Biceps  5/5   Biceps  5/5   Triceps  5/5   Triceps  5/5   Wrist extensors  5/5   Wrist extensors  5/5   Wrist flexors  5/5   Wrist flexors  5/5   Finger extensors  5/5   Finger extensors  5/5   Finger flexors  5/5   Finger flexors  5/5   Dorsal interossei  5/5   Dorsal interossei  5/5   Abductor pollicis  5/5   Abductor pollicis  5/5   Tone (Ashworth scale)  0  Tone (Ashworth scale)  0   Right Lower Extremity:    Left Lower Extremity:    Hip flexors  5/5   Hip flexors  5/5   Hip extensors  5/5   Hip extensors  5/5   Knee flexors  4/5   Knee flexors  5-/5   Knee extensors  5-/5   Knee extensors  5-/5   Dorsiflexors  4/5   Dorsiflexors  4/5   Plantarflexors  4/5   Plantarflexors  4/5   Toe extensors  4/5   Toe extensors  4/5   Toe flexors  4/5   Toe flexors  4/5   Tone (Ashworth scale)  0  Tone (Ashworth scale)  0   MSRs:  Right                                                                 Left brachioradialis 2+  brachioradialis 2+  biceps 2+  biceps 2+  triceps 2+  triceps 2+  patellar 0  Patellar 0  ankle jerk 0  ankle jerk 0  Hoffman Jon  Hoffman Jon  plantar response down  plantar response down   SENSORY:  Temperature, pin prick, and vibration is reduced distal to mid-calf and absent at the ankles.    COORDINATION/GAIT: Normal finger-to- nose-finger.  Intact rapid alternating movements bilaterally.  Gait shows bilateral (R>L) steppage and wide-based, assisted with cane.   IMPRESSION: 1.  Episodic left > than right facial paresthesias.  Exam is nonfocal and does not disclose any sensory abnormalities. There are Jon pathological facial reflexes. He denies any lancinating pain, as seen in trigeminal neuralgia.  Jon may certainly cause sensory neuropathy of the face, however this would be a  diagnosis of exclusion especially given his well-controlled sugars. I recommend MRI brain with and without contrast to further evaluate for any intracranial structural lesion. I will also check labs as follows: Check ESR, CRP, TSH, Vitamin B12, SPEP with IFE  2.  History of poliomyelitis with residual bilateral lower extremity atrophy and weakness. He is being evaluated for leg braces but due to cost, has not been fitted for this yet. Fall precautions discussed.   3.  Diabetic peripheral neuropathy conforming to gradient pattern affecting the lower extremities. Pain is well-controlled on gabapentin 300 mg twice daily.   4.  Jon embolic left MCA stroke, March 2015.  He has Jon residual deficits from this. Continue aspirin 81 mg daily. Blood pressure and less strong is well-controlled on medications.  Return to clinic in 3 months.   The duration of this appointment visit was 50 minutes of face-to-face time with the patient.  Greater than 50% of this time was spent in counseling, explanation of diagnosis, planning of further management, and coordination of care.   Thank you for allowing me to participate in patient's care.  If I can answer any additional questions, I would be pleased to do so.    Sincerely,    Robby Pirani K. Posey Pronto, DO

## 2016-09-28 NOTE — Patient Instructions (Signed)
MRI brain with and without contrast  Check labs  Return to clinic in 3 months

## 2016-10-03 DIAGNOSIS — E1165 Type 2 diabetes mellitus with hyperglycemia: Secondary | ICD-10-CM | POA: Diagnosis not present

## 2016-10-03 DIAGNOSIS — Z125 Encounter for screening for malignant neoplasm of prostate: Secondary | ICD-10-CM | POA: Diagnosis not present

## 2016-10-03 LAB — IMMUNOFIXATION ELECTROPHORESIS
IGG (IMMUNOGLOBIN G), SERUM: 777 mg/dL (ref 694–1618)
IMMUNOFIX ELECTR INT: NOT DETECTED
IgM, Serum: 60 mg/dL (ref 48–271)
Immunoglobulin A: 176 mg/dL (ref 81–463)

## 2016-10-03 LAB — PROTEIN ELECTROPHORESIS, SERUM
Albumin ELP: 4 g/dL (ref 3.8–4.8)
Alpha 1: 0.3 g/dL (ref 0.2–0.3)
Alpha 2: 0.9 g/dL (ref 0.5–0.9)
BETA GLOBULIN: 0.5 g/dL (ref 0.4–0.6)
Beta 2: 0.3 g/dL (ref 0.2–0.5)
GAMMA GLOBULIN: 0.7 g/dL — AB (ref 0.8–1.7)
Total Protein: 6.7 g/dL (ref 6.1–8.1)

## 2016-10-05 ENCOUNTER — Telehealth: Payer: Self-pay | Admitting: *Deleted

## 2016-10-05 DIAGNOSIS — G4733 Obstructive sleep apnea (adult) (pediatric): Secondary | ICD-10-CM | POA: Diagnosis not present

## 2016-10-05 DIAGNOSIS — E0821 Diabetes mellitus due to underlying condition with diabetic nephropathy: Secondary | ICD-10-CM | POA: Diagnosis not present

## 2016-10-05 DIAGNOSIS — I152 Hypertension secondary to endocrine disorders: Secondary | ICD-10-CM | POA: Diagnosis not present

## 2016-10-05 DIAGNOSIS — E119 Type 2 diabetes mellitus without complications: Secondary | ICD-10-CM | POA: Diagnosis not present

## 2016-10-05 DIAGNOSIS — Z5181 Encounter for therapeutic drug level monitoring: Secondary | ICD-10-CM | POA: Diagnosis not present

## 2016-10-05 DIAGNOSIS — Z981 Arthrodesis status: Secondary | ICD-10-CM | POA: Diagnosis not present

## 2016-10-05 DIAGNOSIS — M48061 Spinal stenosis, lumbar region without neurogenic claudication: Secondary | ICD-10-CM | POA: Diagnosis not present

## 2016-10-05 DIAGNOSIS — Z940 Kidney transplant status: Secondary | ICD-10-CM | POA: Diagnosis not present

## 2016-10-05 DIAGNOSIS — Z794 Long term (current) use of insulin: Secondary | ICD-10-CM | POA: Diagnosis not present

## 2016-10-05 DIAGNOSIS — Z7952 Long term (current) use of systemic steroids: Secondary | ICD-10-CM | POA: Diagnosis not present

## 2016-10-05 DIAGNOSIS — Z4822 Encounter for aftercare following kidney transplant: Secondary | ICD-10-CM | POA: Diagnosis not present

## 2016-10-05 DIAGNOSIS — G8929 Other chronic pain: Secondary | ICD-10-CM | POA: Diagnosis not present

## 2016-10-05 DIAGNOSIS — M549 Dorsalgia, unspecified: Secondary | ICD-10-CM | POA: Diagnosis not present

## 2016-10-05 DIAGNOSIS — R600 Localized edema: Secondary | ICD-10-CM | POA: Diagnosis not present

## 2016-10-05 DIAGNOSIS — B349 Viral infection, unspecified: Secondary | ICD-10-CM | POA: Diagnosis not present

## 2016-10-05 DIAGNOSIS — Z792 Long term (current) use of antibiotics: Secondary | ICD-10-CM | POA: Diagnosis not present

## 2016-10-05 DIAGNOSIS — Z79899 Other long term (current) drug therapy: Secondary | ICD-10-CM | POA: Diagnosis not present

## 2016-10-05 DIAGNOSIS — I10 Essential (primary) hypertension: Secondary | ICD-10-CM | POA: Diagnosis not present

## 2016-10-05 NOTE — Telephone Encounter (Signed)
-----   Message from Alda Berthold, DO sent at 10/03/2016  9:21 AM EDT ----- Please notify patient lab are within normal limits. Once we get the results of his MRI brain, we will call him.  Thank you.

## 2016-10-05 NOTE — Telephone Encounter (Signed)
Left message with patient's wife that labs are normal.

## 2016-10-10 DIAGNOSIS — D510 Vitamin B12 deficiency anemia due to intrinsic factor deficiency: Secondary | ICD-10-CM | POA: Diagnosis not present

## 2016-10-10 DIAGNOSIS — Z23 Encounter for immunization: Secondary | ICD-10-CM | POA: Diagnosis not present

## 2016-10-10 DIAGNOSIS — Z79899 Other long term (current) drug therapy: Secondary | ICD-10-CM | POA: Diagnosis not present

## 2016-10-10 DIAGNOSIS — Z1331 Encounter for screening for depression: Secondary | ICD-10-CM | POA: Diagnosis not present

## 2016-10-10 DIAGNOSIS — Z9889 Other specified postprocedural states: Secondary | ICD-10-CM | POA: Diagnosis not present

## 2016-10-10 DIAGNOSIS — Z0000 Encounter for general adult medical examination without abnormal findings: Secondary | ICD-10-CM | POA: Diagnosis not present

## 2016-10-10 DIAGNOSIS — M67442 Ganglion, left hand: Secondary | ICD-10-CM | POA: Insufficient documentation

## 2016-10-10 HISTORY — DX: Ganglion, left hand: M67.442

## 2016-10-15 ENCOUNTER — Other Ambulatory Visit: Payer: Medicare Other

## 2016-10-18 DIAGNOSIS — M25532 Pain in left wrist: Secondary | ICD-10-CM | POA: Diagnosis not present

## 2016-10-18 DIAGNOSIS — R2232 Localized swelling, mass and lump, left upper limb: Secondary | ICD-10-CM | POA: Diagnosis not present

## 2016-10-18 DIAGNOSIS — M7989 Other specified soft tissue disorders: Secondary | ICD-10-CM

## 2016-10-18 DIAGNOSIS — M25332 Other instability, left wrist: Secondary | ICD-10-CM

## 2016-10-18 DIAGNOSIS — D510 Vitamin B12 deficiency anemia due to intrinsic factor deficiency: Secondary | ICD-10-CM | POA: Diagnosis not present

## 2016-10-18 HISTORY — DX: Other specified soft tissue disorders: M79.89

## 2016-10-18 HISTORY — DX: Other instability, left wrist: M25.332

## 2016-10-19 DIAGNOSIS — E042 Nontoxic multinodular goiter: Secondary | ICD-10-CM | POA: Diagnosis not present

## 2016-10-19 DIAGNOSIS — E049 Nontoxic goiter, unspecified: Secondary | ICD-10-CM | POA: Diagnosis not present

## 2016-10-20 DIAGNOSIS — Z940 Kidney transplant status: Secondary | ICD-10-CM | POA: Diagnosis not present

## 2016-10-21 ENCOUNTER — Other Ambulatory Visit: Payer: Self-pay | Admitting: Neurology

## 2016-10-21 ENCOUNTER — Ambulatory Visit
Admission: RE | Admit: 2016-10-21 | Discharge: 2016-10-21 | Disposition: A | Discharge status: Home / Self Care | Payer: Medicare Other | Source: Ambulatory Visit | Attending: Neurology | Admitting: Neurology

## 2016-10-21 DIAGNOSIS — J329 Chronic sinusitis, unspecified: Secondary | ICD-10-CM | POA: Diagnosis not present

## 2016-10-21 DIAGNOSIS — R202 Paresthesia of skin: Secondary | ICD-10-CM

## 2016-10-21 DIAGNOSIS — R209 Unspecified disturbances of skin sensation: Principal | ICD-10-CM

## 2016-10-24 ENCOUNTER — Telehealth: Payer: Self-pay | Admitting: *Deleted

## 2016-10-24 NOTE — Telephone Encounter (Signed)
-----   Message from Alda Berthold, DO sent at 10/22/2016  9:22 PM EDT ----- Please inform patient that his MRI brain does not show anything to explain his facial tingling.  There is evidence of his old stroke, but nothing new.  Labs are also normal.  We will wait and see if symptoms slowly improve over time. He is scheduled for f/u in December and I can readdress symptoms at that time.  If he develops any new neurological symptoms/concerns, call for sooner appt.

## 2016-10-24 NOTE — Telephone Encounter (Signed)
Left message giving patient results and instructions.   

## 2016-10-25 ENCOUNTER — Telehealth: Payer: Self-pay | Admitting: *Deleted

## 2016-10-25 NOTE — Telephone Encounter (Signed)
    Chart reviewed as part of pre-operative protocol coverage. Because of BARNARD SHARPS past medical history and time since last visit, he/she will require a follow-up visit in order to better assess preoperative cardiovascular risk. Last seen in 2015.  Pre-op covering staff: - Please schedule appointment and call patient to inform them. - Please contact requesting surgeon's office via preferred method (i.e, phone, fax) to inform them of need for appointment prior to surgery.  Charlie Pitter, PA-C  10/25/2016, 2:59 PM

## 2016-10-25 NOTE — Telephone Encounter (Signed)
Patient scheduled to see Kerin Ransom, [place] on 10/22. Patient is aware and thanked me for the call. Notified Robin at [facility] of patient's appointment who took a message and states she will forward to the appropriate parties

## 2016-10-25 NOTE — Telephone Encounter (Signed)
   [facility] Pre-operative Risk Assessment    Request for surgical clearance:  1. What type of surgery is being performed? Left wrist Proximal Row Carpectomy with left long finger mass excision, left posterior Interosseous nerve excision  2. When is this surgery scheduled? 10.31.2018  3. Are there any medications that need to be held prior to surgery and how long? :Cardiac Clearance: No medications to be held.  4. Practice name and name of physician performing surgery? [facility]  5. What is your office phone and fax number? PH: [phone] FX: [phone]   5. Anesthesia type (None, local, MAC, general) ? General and Axillary Block   [place], Jon [name] 10/25/2016, 11:53 AM  _________________________________________________________________   (provider comments below)

## 2016-10-26 DIAGNOSIS — K219 Gastro-esophageal reflux disease without esophagitis: Secondary | ICD-10-CM | POA: Diagnosis not present

## 2016-10-26 DIAGNOSIS — Z79899 Other long term (current) drug therapy: Secondary | ICD-10-CM | POA: Diagnosis not present

## 2016-10-26 DIAGNOSIS — D649 Anemia, unspecified: Secondary | ICD-10-CM | POA: Diagnosis not present

## 2016-10-26 DIAGNOSIS — E7800 Pure hypercholesterolemia, unspecified: Secondary | ICD-10-CM | POA: Diagnosis not present

## 2016-10-26 DIAGNOSIS — E1122 Type 2 diabetes mellitus with diabetic chronic kidney disease: Secondary | ICD-10-CM | POA: Diagnosis not present

## 2016-10-26 DIAGNOSIS — Z7982 Long term (current) use of aspirin: Secondary | ICD-10-CM | POA: Diagnosis not present

## 2016-10-26 DIAGNOSIS — D696 Thrombocytopenia, unspecified: Secondary | ICD-10-CM | POA: Diagnosis not present

## 2016-10-26 DIAGNOSIS — N186 End stage renal disease: Secondary | ICD-10-CM | POA: Diagnosis not present

## 2016-10-26 DIAGNOSIS — D631 Anemia in chronic kidney disease: Secondary | ICD-10-CM | POA: Diagnosis not present

## 2016-10-26 DIAGNOSIS — I679 Cerebrovascular disease, unspecified: Secondary | ICD-10-CM | POA: Diagnosis not present

## 2016-10-26 DIAGNOSIS — Z940 Kidney transplant status: Secondary | ICD-10-CM | POA: Diagnosis not present

## 2016-10-26 DIAGNOSIS — Z794 Long term (current) use of insulin: Secondary | ICD-10-CM | POA: Diagnosis not present

## 2016-10-26 DIAGNOSIS — I120 Hypertensive chronic kidney disease with stage 5 chronic kidney disease or end stage renal disease: Secondary | ICD-10-CM | POA: Diagnosis not present

## 2016-10-27 DIAGNOSIS — I10 Essential (primary) hypertension: Secondary | ICD-10-CM | POA: Diagnosis not present

## 2016-10-27 DIAGNOSIS — Z940 Kidney transplant status: Secondary | ICD-10-CM | POA: Diagnosis not present

## 2016-10-27 DIAGNOSIS — M545 Low back pain: Secondary | ICD-10-CM | POA: Diagnosis not present

## 2016-10-27 DIAGNOSIS — M961 Postlaminectomy syndrome, not elsewhere classified: Secondary | ICD-10-CM | POA: Diagnosis not present

## 2016-10-27 DIAGNOSIS — R809 Proteinuria, unspecified: Secondary | ICD-10-CM | POA: Diagnosis not present

## 2016-10-27 DIAGNOSIS — N184 Chronic kidney disease, stage 4 (severe): Secondary | ICD-10-CM | POA: Diagnosis not present

## 2016-10-27 DIAGNOSIS — E1129 Type 2 diabetes mellitus with other diabetic kidney complication: Secondary | ICD-10-CM | POA: Diagnosis not present

## 2016-10-28 ENCOUNTER — Telehealth: Payer: Self-pay | Admitting: Cardiology

## 2016-10-28 DIAGNOSIS — M545 Low back pain: Secondary | ICD-10-CM | POA: Diagnosis not present

## 2016-10-28 NOTE — Telephone Encounter (Signed)
Closed Encounter  °

## 2016-10-31 ENCOUNTER — Ambulatory Visit (INDEPENDENT_AMBULATORY_CARE_PROVIDER_SITE_OTHER): Payer: Medicare Other | Admitting: Cardiology

## 2016-10-31 ENCOUNTER — Encounter: Payer: Self-pay | Admitting: Cardiology

## 2016-10-31 VITALS — BP 134/68 | HR 67 | Ht 71.0 in | Wt 261.8 lb

## 2016-10-31 DIAGNOSIS — I10 Essential (primary) hypertension: Secondary | ICD-10-CM

## 2016-10-31 DIAGNOSIS — Z01810 Encounter for preprocedural cardiovascular examination: Secondary | ICD-10-CM

## 2016-10-31 DIAGNOSIS — Z8673 Personal history of transient ischemic attack (TIA), and cerebral infarction without residual deficits: Secondary | ICD-10-CM | POA: Diagnosis not present

## 2016-10-31 DIAGNOSIS — Z940 Kidney transplant status: Secondary | ICD-10-CM | POA: Diagnosis not present

## 2016-10-31 DIAGNOSIS — M48062 Spinal stenosis, lumbar region with neurogenic claudication: Secondary | ICD-10-CM

## 2016-10-31 DIAGNOSIS — G4733 Obstructive sleep apnea (adult) (pediatric): Secondary | ICD-10-CM | POA: Diagnosis not present

## 2016-10-31 NOTE — Assessment & Plan Note (Signed)
Prior back surgery, uses a cane

## 2016-10-31 NOTE — Assessment & Plan Note (Signed)
Controlled.  

## 2016-10-31 NOTE — Patient Instructions (Signed)
Medication Instructions: Your physician recommends that you continue on your current medications as directed. Please refer to the Current Medication list given to you today.   Follow-Up: Your physician recommends that you schedule a follow-up appointment as needed.   Any Other Special Instructions are listed below:  You have been cleared for surgery.   If you need a refill on your cardiac medications before your next appointment, please call your pharmacy.

## 2016-10-31 NOTE — Assessment & Plan Note (Signed)
On C-pap-Dr Annamaria Boots follows

## 2016-10-31 NOTE — Progress Notes (Signed)
10/31/2016 Jon [name]   1950/07/28  [id number]  Primary Physician [name], MD Primary Cardiologist: Dr Rayann Heman  HPI:  66 y/o male from [place] seen in March 2015 after he had a Lt brain stroke treated with tPA. There was no obvious source. A loop recorder was placed that never showed arrhythmia. He has had no residual effects from his stroke. He had normal LVF on TTE and no vegetation or clot on TEE. Other medical problems include HTN, IDDM, HLD, obesity and sleep apnea (on C-pap followed by Dr Annamaria Boots) DJD-s/p back surgery (still uses a cane), and ESRD. In 2015 he had a kidney transplant at [facility]. He is now followed by Dr Moshe Cipro. A Myoview was done in May 2015 prior to his transplant and was low risk. We have not seen the pt since 2015.  He is in the office today for pre op clearance prior to Lt wrist/ hand surgery by Dr Burney Gauze.  He denies any unusual chest pain or dyspnea. His activity is limited secondary to leg weakness after his back surgery but he gets around the house without difficulty.      Current Outpatient Prescriptions  Medication Sig Dispense Refill  . amLODipine (NORVASC) 10 MG tablet Take 5 mg by mouth daily after breakfast.     . aspirin 81 MG tablet Take 81 mg by mouth daily.    [name] atorvastatin (LIPITOR) 40 MG tablet Take 40 mg by mouth daily.    . Blood Glucose Monitoring Suppl (ONE TOUCH ULTRA 2) w/Device KIT     . calcitRIOL (ROCALTROL) 0.25 MCG capsule TAKE 1 CAPSULE 5 TIMES PER WEEK  6  . furosemide (LASIX) 80 MG tablet Take 2 tablet and 1 tablet evening  6  . gabapentin (NEURONTIN) 300 MG capsule Take 300 mg by mouth 2 (two) times daily.    [name] HYDROcodone-acetaminophen (NORCO/VICODIN) 5-325 MG per tablet Take 1-2 tablets by mouth every 6 (six) hours as needed for moderate pain.     [name] insulin lispro (HUMALOG) 100 UNIT/ML injection Inject 2-16 Units into the skin See admin instructions. Inject 10 units subque 3 times daily with meals and Inject  2-16 units per sliding scale 3 times daily with meals.    [name] labetalol (NORMODYNE) 300 MG tablet Take 300 mg by mouth 3 (three) times daily.     [name] LANTUS SOLOSTAR 100 UNIT/ML Solostar Pen Inject 48 Units into the skin at bedtime.     . magnesium oxide (MAG-OX) 400 MG tablet Take 400 mg by mouth daily.    . Multiple Vitamin (MULTIVITAMIN) tablet Take 1 tablet by mouth daily.    . mycophenolate (MYFORTIC) 180 MG EC tablet Take 180 mg by mouth 2 (two) times daily.    [name] omeprazole (PRILOSEC) 20 MG capsule Take 20 mg by mouth daily.    . Potassium Citrate 15 MEQ (1620 MG) TBCR Take 1 tablet by mouth 3 (three) times daily.     . predniSONE (STERAPRED UNI-PAK) 5 MG TABS tablet Take 5 mg by mouth daily.    [name] sulfamethoxazole-trimethoprim (BACTRIM,SEPTRA) 400-80 MG tablet Take 1 tablet by mouth 3 (three) times a week. Monday, Wednesday, Friday    . tacrolimus (PROGRAF) 1 MG capsule Take 3 mg by mouth 2 (two) times daily.     . tamsulosin (FLOMAX) 0.4 MG CAPS capsule Take 0.4 mg by mouth daily after breakfast.    . vitamin B-12 (CYANOCOBALAMIN) 1000 MCG tablet Take 2,000 mcg by mouth daily.  No current facility-administered medications for this visit.     Allergies  Allergen Reactions  . Lisinopril Other (See Comments)    Increased potassium level   . Meloxicam Nausea And Vomiting  . Victoza [Liraglutide] Diarrhea, Nausea And Vomiting and Swelling    Past Medical History:  Diagnosis Date  . Arthritis    back, hands   . Chronic back pain    radiculopathy and stenosis  . Chronic kidney disease    [name] awaiting Transplant   . CVA (cerebral infarction) 03/2013  . Diabetes mellitus     Lantus nightly ;type 2  . GERD (gastroesophageal reflux disease)   . H/O hiatal hernia   . History of colon polyps   . Hx of cardiovascular stress test    a.  Lexiscan Myoview (05/2013):  No ischemia, EF 53%; Normal Study  . Hyperlipidemia    takes Lovastatin daily  . Hypertension    takes  Amlodipine and Catapres daily    dr Forrestine Him, Loop Recorder - [facility]  . Nocturia   . Peripheral edema    takes Lasix daily  . Sleep apnea    cpap , study in their home, 09/2102- [facility]           . Stroke (HCC)    speech, rt arm weakness  . Urinary frequency     Social History   Social History  . Marital status: Married    Spouse name: Jon [name]  . Number of children: 5  . Years of education: 8th   Occupational History  . disabled    Social History Main Topics  . Smoking status: Former Smoker    Packs/day: 0.50    Years: 40.00    Types: Cigarettes    Quit date: 12/27/2012  . Smokeless tobacco: Never Used  . Alcohol use No  . Drug use: No  . Sexual activity: Yes   Other Topics Concern  . Not on file   Social History Narrative   Patient lives at home with his wife Jon [name]).  One story home with basement.   Patient is disabled.   Education 8th grade.   Right handed.   Caffeine One cup of coffee daily.   Retired Administrator.   5 children.     Family History  Problem Relation Age of Onset  . Hypertension Mother   . Hyperlipidemia Father   . Hypertension Father   . Heart disease Father        before age 18  . Renal Disease Father   . Diabetes Brother   . Hyperlipidemia Son      Review of Systems: General: negative for chills, fever, night sweats or weight changes.  Cardiovascular: negative for chest pain, dyspnea on exertion, edema, orthopnea, palpitations, paroxysmal nocturnal dyspnea or shortness of breath Dermatological: negative for rash Respiratory: negative for cough or wheezing Urologic: negative for hematuria Abdominal: negative for nausea, vomiting, diarrhea, bright red blood per rectum, melena, or hematemesis Neurologic: negative for visual changes, syncope, or dizziness All other systems reviewed and are otherwise negative except as noted above.    Blood pressure 134/68, pulse 67, height '5\' 11"'  (1.803 m), weight 261 lb 12.8 oz (118.8  kg).  General appearance: alert, cooperative, no distress and moderately obese Neck: no JVD Lungs: clear to auscultation bilaterally Heart: regular rate and rhythm and 1/6 systolic murmur AOV  Abdomen: obese, non tender Extremities: trace RLE edema Pulses: 2+ and symmetric Skin: Skin color, texture, turgor normal. No rashes or  lesions Neurologic: Grossly normal  EKG NSR  ASSESSMENT AND PLAN:   Pre-operative cardiovascular examination Seen today prior to Lt wrist/ hand surgery- Pt is a class 3 risk with a 6.6% chance of an adverse cardiac event based on his history of prior stroke and IDDM. Discussed with Dr Stanford Breed in the office today, we are unable to assess the pt's functional status secondary to his inability to exercise but based on his normal EKG and prior negative Myoview he is an acceptable risk from a cardiac standpoint for proposed surgery without further cardiac work up.   History of stroke Lt brain cryptogenic stroke March 2015-no residual  Lumbar stenosis with neurogenic claudication Prior back surgery, uses a cane  HTN (hypertension) Controlled  OSA (obstructive sleep apnea) On C-pap-Dr Annamaria Boots follows   PLAN  Pt is cleared from a cardiac standpoint without further work up. We will see again at the request of his PCP or PRN.   Kerin Ransom PA-C 10/31/2016 12:21 PM

## 2016-10-31 NOTE — Assessment & Plan Note (Signed)
Lt brain cryptogenic stroke March 2015-no residual

## 2016-10-31 NOTE — Assessment & Plan Note (Addendum)
Seen today prior to Lt wrist/ hand surgery- Pt is a class 3 risk with a 6.6% chance of an adverse cardiac event based on his history of prior stroke and IDDM. Discussed with Dr Stanford Breed in the office today, we are unable to assess the pt's functional status secondary to his inability to exercise but based on his normal EKG and prior negative Myoview he is an acceptable risk from a cardiac standpoint for proposed surgery without further cardiac work up.

## 2016-11-08 DIAGNOSIS — N189 Chronic kidney disease, unspecified: Secondary | ICD-10-CM | POA: Diagnosis not present

## 2016-11-08 DIAGNOSIS — D509 Iron deficiency anemia, unspecified: Secondary | ICD-10-CM | POA: Diagnosis not present

## 2016-11-08 DIAGNOSIS — D696 Thrombocytopenia, unspecified: Secondary | ICD-10-CM | POA: Diagnosis not present

## 2016-11-08 DIAGNOSIS — Z940 Kidney transplant status: Secondary | ICD-10-CM | POA: Diagnosis not present

## 2016-11-09 ENCOUNTER — Other Ambulatory Visit: Payer: Self-pay

## 2016-11-09 DIAGNOSIS — G8918 Other acute postprocedural pain: Secondary | ICD-10-CM | POA: Diagnosis not present

## 2016-11-09 DIAGNOSIS — M65842 Other synovitis and tenosynovitis, left hand: Secondary | ICD-10-CM | POA: Diagnosis not present

## 2016-11-09 DIAGNOSIS — M19032 Primary osteoarthritis, left wrist: Secondary | ICD-10-CM | POA: Diagnosis not present

## 2016-11-09 DIAGNOSIS — M25532 Pain in left wrist: Secondary | ICD-10-CM | POA: Diagnosis not present

## 2016-11-09 DIAGNOSIS — R2232 Localized swelling, mass and lump, left upper limb: Secondary | ICD-10-CM | POA: Diagnosis not present

## 2016-11-09 DIAGNOSIS — D2112 Benign neoplasm of connective and other soft tissue of left upper limb, including shoulder: Secondary | ICD-10-CM | POA: Diagnosis not present

## 2016-11-14 DIAGNOSIS — R2232 Localized swelling, mass and lump, left upper limb: Secondary | ICD-10-CM | POA: Diagnosis not present

## 2016-11-14 DIAGNOSIS — M25532 Pain in left wrist: Secondary | ICD-10-CM | POA: Diagnosis not present

## 2016-11-14 DIAGNOSIS — M25332 Other instability, left wrist: Secondary | ICD-10-CM | POA: Diagnosis not present

## 2016-11-15 DIAGNOSIS — D509 Iron deficiency anemia, unspecified: Secondary | ICD-10-CM | POA: Diagnosis not present

## 2016-11-17 ENCOUNTER — Telehealth: Payer: Self-pay | Admitting: *Deleted

## 2016-11-17 NOTE — Telephone Encounter (Signed)
Call from [facility] stating they received a referral from this office on patient. Patient has never been seen here. Called patient and he said Dr. Bertis Ruddy office is trying to refer him here.

## 2016-11-18 DIAGNOSIS — D509 Iron deficiency anemia, unspecified: Secondary | ICD-10-CM | POA: Diagnosis not present

## 2016-11-22 ENCOUNTER — Other Ambulatory Visit: Payer: Self-pay | Admitting: Pharmacist

## 2016-11-22 ENCOUNTER — Encounter: Payer: Self-pay | Admitting: Infectious Diseases

## 2016-11-22 ENCOUNTER — Ambulatory Visit: Payer: Medicare Other | Admitting: Infectious Diseases

## 2016-11-22 VITALS — BP 130/70 | HR 66 | Temp 98.3°F | Wt 255.0 lb

## 2016-11-22 DIAGNOSIS — Z940 Kidney transplant status: Secondary | ICD-10-CM

## 2016-11-22 DIAGNOSIS — L089 Local infection of the skin and subcutaneous tissue, unspecified: Secondary | ICD-10-CM | POA: Diagnosis not present

## 2016-11-22 HISTORY — DX: Local infection of the skin and subcutaneous tissue, unspecified: L08.9

## 2016-11-22 NOTE — Assessment & Plan Note (Addendum)
Encapsulated, superficial fungal infection of left #3 digit s/p trauma related to previous inoculation with splinter from treated wood 2 years prior. This has been smoldering a while and has not caused systemic infection in this transplant patient. Unfortunately we are not able to do C&S off previously collected specimen d/t preparation with formalin.   Discussed further with pathologist Haywood Lasso and she was not able to provide any further identifying information based off the morphology of the specimen with how it was stained/prepared.   Discussed with Dr. Burney Gauze over the phone and he reported that the mass was excised completely intact and resembled an inclusion cyst - nothing further to excise at this point. He saw the patient yesterday and removed stitches and reported the black area was due to the fact he did not excise expanded tissue and was not concerned over appearance.   At this point there is a broad differential considering nocardia, blastocytosis, cryptococcus, histoplasma, aspirgillous species, sporotrichosis and other mold species. Will need prolonged course of therapy. May need to consider sending him to dermatology to see if they can obtain skin biopsy, although uncertain given the location. Would like to avoid any nephrotoxic antifungals and ideally avoid PICC line with his immunosuppressed state. Considering voriconazole - however will need anti-rejection meds adjusted and monitored while on this course due to significant DDIs. I will call and discuss with his transplant team @ [facility] [phone].

## 2016-11-22 NOTE — Progress Notes (Addendum)
Patient Name: Jon [name]  MRN: [id number]  DOB: 1950/03/05    Reason for Visit: Fungal finger infection  Referring Provider: Dr. Marzetta Merino     INFECTIOUS DISEASE OFFICE CONSULT SUBJECTIVE:  HPI: Jon [name] is a 66 y.o. male here today for evaluation of fungal infection of his left middle finger. He is s/p left wrist proximal row carpectomy with left finger mass excision and left posterior interosseous nerve excision by Dr. Marzetta Merino on 11/09/16.   Prior to his surgery about 2 years ago he was working with treated wood when he had a long splinter get lodged into his left middle finger. He pulled it out and cleaned it up and bandaged it up. A few weeks after it healed a nodule popped up that he describes to have been mobile and painless in this area. 6 months ago a second nodule popped up adjacent to it more on the top center of his finger and was rather large. This was drained in a doctor's office when dark almost black material drained out with a strong odor. Unfortunately the area would re accumulate within 24 hours. 2 weeks later was referred to Dr. Burney Gauze for excision which was don in conjunction with proximal row carpectomy on 11/09/16. XRays pre op from what the patient tells me showed only superficial involvement and no compromise to the underlying bone or joint. Mr. Ferrelli also denies having had any pain or altered ROM of the finger. No fevers, malaise, unintentional weight loss or other signs of systemic illness. Went to see surgeon yesterday to have sutures removed. Wound currently without drainage and open to air.   S/P kidney transplant in September 5701; complicated by low level BK viremia initially but has done well. Continues on life-long predinsone, tacrolimus, and prophylaxis with SS Bactrim 3x weekly. He is a diabetic patient with FBS 130s.   Patient Active Problem List   Diagnosis Date Noted  . Finger infection, fungal 11/22/2016  . Dyspnea on exertion  08/24/2016  . Embolic stroke ([place]) 77/93/9030  . HLD (hyperlipidemia) 05/02/2013  . OSA (obstructive sleep apnea) 05/02/2013  . Pre-operative cardiovascular examination 03/22/2013  . History of stroke 03/16/2013  . Movement disorder 03/16/2013  . Lumbar stenosis with neurogenic claudication 12/27/2012  . Chronic back pain 01/27/2011  . Tobacco use disorder 01/27/2011  . HTN (hypertension) 01/27/2011  . History of kidney transplant 01/24/2011  . Hyperkalemia 01/23/2011  . Diabetes mellitus 01/23/2011  . Lower back pain 01/23/2011      Medication List        Accurate as of 11/22/16 11:59 PM. Always use your most recent med list.          amLODipine 10 MG tablet Commonly known as:  NORVASC   aspirin 81 MG tablet   atorvastatin 40 MG tablet Commonly known as:  LIPITOR   calcitRIOL 0.25 MCG capsule Commonly known as:  ROCALTROL   furosemide 80 MG tablet Commonly known as:  LASIX   gabapentin 300 MG capsule Commonly known as:  NEURONTIN   HYDROcodone-acetaminophen 5-325 MG tablet Commonly known as:  NORCO/VICODIN   insulin lispro 100 UNIT/ML injection Commonly known as:  HUMALOG   labetalol 300 MG tablet Commonly known as:  NORMODYNE   LANTUS SOLOSTAR 100 UNIT/ML Solostar Pen Generic drug:  Insulin Glargine   magnesium oxide 400 MG tablet Commonly known as:  MAG-OX   multivitamin tablet   mycophenolate 180 MG EC tablet Commonly known as:  MYFORTIC  omeprazole 20 MG capsule Commonly known as:  PRILOSEC   ONE TOUCH ULTRA 2 w/Device Kit   Potassium Citrate 15 MEQ (1620 MG) Tbcr   predniSONE 5 MG Tabs tablet Commonly known as:  STERAPRED UNI-PAK   sulfamethoxazole-trimethoprim 400-80 MG tablet Commonly known as:  BACTRIM,SEPTRA   tacrolimus 1 MG capsule Commonly known as:  PROGRAF   tamsulosin 0.4 MG Caps capsule Commonly known as:  FLOMAX   vitamin B-12 1000 MCG tablet Commonly known as:  CYANOCOBALAMIN       Review of Systems    Constitutional: Positive for weight loss (intentional ). Negative for chills and fever.  Eyes: Negative for blurred vision, double vision and photophobia.  Respiratory: Negative for cough and sputum production.   Cardiovascular: Negative for chest pain and orthopnea.  Gastrointestinal: Negative for diarrhea and vomiting.  Genitourinary: Negative for dysuria.  Musculoskeletal: Negative for myalgias.  Skin: Negative for rash.  Neurological: Negative for dizziness and headaches.  Endo/Heme/Allergies: Negative for polydipsia. Does not bruise/bleed easily.      Past Medical History:  Diagnosis Date  . Arthritis    back, hands   . Chronic back pain    radiculopathy and stenosis  . Chronic kidney disease    [name] awaiting Transplant   . CVA (cerebral infarction) 03/2013  . Diabetes mellitus     Lantus nightly ;type 2  . GERD (gastroesophageal reflux disease)   . H/O hiatal hernia   . History of colon polyps   . Hx of cardiovascular stress test    a.  Lexiscan Myoview (05/2013):  No ischemia, EF 53%; Normal Study  . Hyperlipidemia    takes Lovastatin daily  . Hypertension    takes Amlodipine and Catapres daily    dr Forrestine Him, Loop Recorder - [facility]  . Nocturia   . Peripheral edema    takes Lasix daily  . Sleep apnea    cpap , study in their home, 09/2102- [facility]           . Stroke (HCC)    speech, rt arm weakness  . Urinary frequency     Social History   Tobacco Use  . Smoking status: Former Smoker    Packs/day: 0.50    Years: 40.00    Pack years: 20.00    Types: Cigarettes    Last attempt to quit: 12/27/2012    Years since quitting: 3.9  . Smokeless tobacco: Never Used  Substance Use Topics  . Alcohol use: No    Alcohol/week: 0.0 oz  . Drug use: No    Family History  Problem Relation Age of Onset  . Hypertension Mother   . Hyperlipidemia Father   . Hypertension Father   . Heart disease Father        before age 40  . Renal Disease Father   .  Diabetes Brother   . Hyperlipidemia Son      Allergies  Allergen Reactions  . Lisinopril Other (See Comments)    Increased potassium level   . Meloxicam Nausea And Vomiting  . Victoza [Liraglutide] Diarrhea, Nausea And Vomiting and Swelling     OBJECTIVE: Vitals:   11/22/16 0854  BP: 130/70  Pulse: 66  Temp: 98.3 F (36.8 C)  TempSrc: Oral  Weight: 255 lb (115.7 kg)   Body mass index is 35.57 kg/m.  Physical Exam  Constitutional: He is oriented to person, place, and time and well-developed, well-nourished, and in no distress.  HENT:  Mouth/Throat: Oropharynx is clear and  moist. No oral lesions. Normal dentition. No dental caries.  Eyes: Pupils are equal, round, and reactive to light. No scleral icterus.  Cardiovascular: Normal rate, regular rhythm and normal heart sounds.  Pulmonary/Chest: Effort normal and breath sounds normal. No respiratory distress.  Abdominal: Soft. He exhibits no distension. There is no tenderness.  Musculoskeletal: Normal range of motion.  Left wrist in cast and arm in sling. Left #3 finger with what appears to be eschar in center of wound bed at incision site. Skin surrounding incision site black with jagged edges and mild erythema/sweling. ROM of his finger is normal and unrestricted. No pain. 2+ radial pulse. Capillary refill to distal finger normal.   Lymphadenopathy:    He has no cervical adenopathy.  Neurological: He is alert and oriented to person, place, and time.  Skin: Skin is warm and dry. No rash noted.  Psychiatric: Mood and affect normal.       PATHOLOGY REPORT FROM 11/09/2016: CYSTIC FIBROHISTIOCYTIC GRANULOMATOUS INFLAMMATION AND FOREIGN BODY REACTION IN A BACKGROUND OF CHRONIC ACTIVE TENOSYNOVITIS.  - GMS AND PAS-F STAINS POSITIVE FOR FUNGAL ORGANISMS - AFB STAIN NEGATIVE  - NEGATIVE FOR MALIGNANCY  - FRAGMENTS OF REFRACTILE FOREIGN MATERIAL IDENTIFIED UNDER POLARIZED LIGHT  ASSESSMENT & PLAN:  Problem List Items Addressed  This Visit      Other   Finger infection, fungal - Primary    Encapsulated, superficial fungal infection of left #3 digit s/p trauma related to previous inoculation with splinter from treated wood 2 years prior. This has been smoldering a while and has not caused systemic infection in this transplant patient. Unfortunately we are not able to do C&S off previously collected specimen d/t preparation with formalin.   Discussed further with pathologist Haywood Lasso and she was not able to provide any further identifying information based off the morphology of the specimen with how it was stained/prepared.   Discussed with Dr. Burney Gauze over the phone and he reported that the mass was excised completely intact and resembled an inclusion cyst - nothing further to excise at this point. He saw the patient yesterday and removed stitches and reported the black area was due to the fact he did not excise expanded tissue and was not concerned over appearance.   At this point there is a broad differential considering nocardia, blastocytosis, cryptococcus, histoplasma, aspirgillous species, sporotrichosis and other mold species. Will need prolonged course of therapy. May need to consider sending him to dermatology to see if they can obtain skin biopsy, although uncertain given the location. Would like to avoid any nephrotoxic antifungals and ideally avoid PICC line with his immunosuppressed state. Considering voriconazole - however will need anti-rejection meds adjusted and monitored while on this course due to significant DDIs. I will call and discuss with his transplant team @ [facility] [phone].       History of kidney transplant      I spent 45 minutes in the care of this patient including greater than 50% of time in face to face counsel of the patient re condition, risk of medications, interactions of medications, discussion with care team in coordination of his care.  Janene Madeira, MSN,  [facility]  11/23/2016  10:01 AM

## 2016-11-22 NOTE — Patient Instructions (Addendum)
Nice to meet you both today.   Will discuss with your transplant team as well as Dr. Burney Gauze about a plan going forward for your treatment.    No changes or new medications for now. Will have you back in 3 weeks to discuss further.   Please sign up with MyChart to access your labs and set up email communication with our clinic for non-urgent medical concerns.

## 2016-11-25 ENCOUNTER — Telehealth: Payer: Self-pay | Admitting: Infectious Diseases

## 2016-11-25 MED ORDER — VORICONAZOLE 200 MG PO TABS: 200.0000 mg | ORAL_TABLET | Freq: Two times a day (BID) | ORAL | 2 refills | Status: DC

## 2016-11-25 NOTE — Telephone Encounter (Signed)
Discussed with Jon [name] regarding the plan for treatment of his finger. Unfortunately we are unable to do any cultures or even PCR testing at this point. Will start voriconazole 200 mg BID.   He has an appointment with [facility] team this upcoming Tuesday at 10:30 for evaluation and prograf level adjustment/monitoring.   Will also discuss with our pharmacy team about atorvastatin dose.   He will follow up with me as scheduled for now pending transplant team.   He understands the plan and has no questions.   Janene Madeira, NP

## 2016-11-29 DIAGNOSIS — Z794 Long term (current) use of insulin: Secondary | ICD-10-CM | POA: Diagnosis not present

## 2016-11-29 DIAGNOSIS — T8699 Other complications of unspecified transplanted organ and tissue: Secondary | ICD-10-CM

## 2016-11-29 DIAGNOSIS — Z7952 Long term (current) use of systemic steroids: Secondary | ICD-10-CM | POA: Diagnosis not present

## 2016-11-29 DIAGNOSIS — Z8619 Personal history of other infectious and parasitic diseases: Secondary | ICD-10-CM | POA: Diagnosis not present

## 2016-11-29 DIAGNOSIS — Z79899 Other long term (current) drug therapy: Secondary | ICD-10-CM | POA: Diagnosis not present

## 2016-11-29 DIAGNOSIS — I10 Essential (primary) hypertension: Secondary | ICD-10-CM | POA: Diagnosis not present

## 2016-11-29 DIAGNOSIS — Z792 Long term (current) use of antibiotics: Secondary | ICD-10-CM | POA: Diagnosis not present

## 2016-11-29 DIAGNOSIS — R600 Localized edema: Secondary | ICD-10-CM | POA: Diagnosis not present

## 2016-11-29 DIAGNOSIS — Z940 Kidney transplant status: Secondary | ICD-10-CM | POA: Diagnosis not present

## 2016-11-29 DIAGNOSIS — Z4822 Encounter for aftercare following kidney transplant: Secondary | ICD-10-CM | POA: Diagnosis not present

## 2016-11-29 DIAGNOSIS — G4733 Obstructive sleep apnea (adult) (pediatric): Secondary | ICD-10-CM | POA: Diagnosis not present

## 2016-11-29 DIAGNOSIS — I158 Other secondary hypertension: Secondary | ICD-10-CM | POA: Diagnosis not present

## 2016-11-29 DIAGNOSIS — A088 Other specified intestinal infections: Secondary | ICD-10-CM

## 2016-11-29 DIAGNOSIS — Z5181 Encounter for therapeutic drug level monitoring: Secondary | ICD-10-CM | POA: Diagnosis not present

## 2016-11-29 DIAGNOSIS — E119 Type 2 diabetes mellitus without complications: Secondary | ICD-10-CM | POA: Diagnosis not present

## 2016-11-29 HISTORY — DX: Other specified intestinal infections: T86.99

## 2016-11-29 HISTORY — DX: Other specified intestinal infections: A08.8

## 2016-12-02 DIAGNOSIS — G4733 Obstructive sleep apnea (adult) (pediatric): Secondary | ICD-10-CM | POA: Diagnosis not present

## 2016-12-05 DIAGNOSIS — Z8619 Personal history of other infectious and parasitic diseases: Secondary | ICD-10-CM | POA: Diagnosis not present

## 2016-12-05 DIAGNOSIS — Z79899 Other long term (current) drug therapy: Secondary | ICD-10-CM | POA: Diagnosis not present

## 2016-12-05 DIAGNOSIS — Z9989 Dependence on other enabling machines and devices: Secondary | ICD-10-CM | POA: Diagnosis not present

## 2016-12-05 DIAGNOSIS — Z940 Kidney transplant status: Secondary | ICD-10-CM | POA: Diagnosis not present

## 2016-12-05 DIAGNOSIS — Z794 Long term (current) use of insulin: Secondary | ICD-10-CM | POA: Diagnosis not present

## 2016-12-05 DIAGNOSIS — M25532 Pain in left wrist: Secondary | ICD-10-CM | POA: Diagnosis not present

## 2016-12-05 DIAGNOSIS — M25332 Other instability, left wrist: Secondary | ICD-10-CM | POA: Diagnosis not present

## 2016-12-05 DIAGNOSIS — Z792 Long term (current) use of antibiotics: Secondary | ICD-10-CM | POA: Diagnosis not present

## 2016-12-05 DIAGNOSIS — Z7952 Long term (current) use of systemic steroids: Secondary | ICD-10-CM | POA: Diagnosis not present

## 2016-12-05 DIAGNOSIS — I152 Hypertension secondary to endocrine disorders: Secondary | ICD-10-CM | POA: Diagnosis not present

## 2016-12-05 DIAGNOSIS — Z4822 Encounter for aftercare following kidney transplant: Secondary | ICD-10-CM | POA: Diagnosis not present

## 2016-12-05 DIAGNOSIS — T8699 Other complications of unspecified transplanted organ and tissue: Secondary | ICD-10-CM | POA: Diagnosis not present

## 2016-12-05 DIAGNOSIS — B369 Superficial mycosis, unspecified: Secondary | ICD-10-CM | POA: Diagnosis not present

## 2016-12-05 DIAGNOSIS — G4733 Obstructive sleep apnea (adult) (pediatric): Secondary | ICD-10-CM | POA: Diagnosis not present

## 2016-12-05 DIAGNOSIS — M67442 Ganglion, left hand: Secondary | ICD-10-CM | POA: Diagnosis not present

## 2016-12-05 DIAGNOSIS — L089 Local infection of the skin and subcutaneous tissue, unspecified: Secondary | ICD-10-CM | POA: Diagnosis not present

## 2016-12-05 DIAGNOSIS — I10 Essential (primary) hypertension: Secondary | ICD-10-CM | POA: Diagnosis not present

## 2016-12-05 DIAGNOSIS — E119 Type 2 diabetes mellitus without complications: Secondary | ICD-10-CM | POA: Diagnosis not present

## 2016-12-05 DIAGNOSIS — M25471 Effusion, right ankle: Secondary | ICD-10-CM | POA: Diagnosis not present

## 2016-12-05 DIAGNOSIS — D899 Disorder involving the immune mechanism, unspecified: Secondary | ICD-10-CM | POA: Diagnosis not present

## 2016-12-05 DIAGNOSIS — R2232 Localized swelling, mass and lump, left upper limb: Secondary | ICD-10-CM | POA: Diagnosis not present

## 2016-12-08 DIAGNOSIS — S90521A Blister (nonthermal), right ankle, initial encounter: Secondary | ICD-10-CM | POA: Diagnosis not present

## 2016-12-08 DIAGNOSIS — M25571 Pain in right ankle and joints of right foot: Secondary | ICD-10-CM

## 2016-12-08 DIAGNOSIS — M19071 Primary osteoarthritis, right ankle and foot: Secondary | ICD-10-CM | POA: Diagnosis not present

## 2016-12-08 HISTORY — DX: Pain in right ankle and joints of right foot: M25.571

## 2016-12-13 DIAGNOSIS — L0889 Other specified local infections of the skin and subcutaneous tissue: Secondary | ICD-10-CM | POA: Diagnosis not present

## 2016-12-13 DIAGNOSIS — Z940 Kidney transplant status: Secondary | ICD-10-CM | POA: Diagnosis not present

## 2016-12-13 DIAGNOSIS — R600 Localized edema: Secondary | ICD-10-CM | POA: Diagnosis not present

## 2016-12-13 DIAGNOSIS — M25571 Pain in right ankle and joints of right foot: Secondary | ICD-10-CM | POA: Diagnosis not present

## 2016-12-13 DIAGNOSIS — Z794 Long term (current) use of insulin: Secondary | ICD-10-CM | POA: Diagnosis not present

## 2016-12-13 DIAGNOSIS — Z79899 Other long term (current) drug therapy: Secondary | ICD-10-CM | POA: Diagnosis not present

## 2016-12-13 DIAGNOSIS — T8699 Other complications of unspecified transplanted organ and tissue: Secondary | ICD-10-CM | POA: Diagnosis not present

## 2016-12-13 DIAGNOSIS — B349 Viral infection, unspecified: Secondary | ICD-10-CM | POA: Diagnosis not present

## 2016-12-13 DIAGNOSIS — Z4822 Encounter for aftercare following kidney transplant: Secondary | ICD-10-CM | POA: Diagnosis not present

## 2016-12-13 DIAGNOSIS — I10 Essential (primary) hypertension: Secondary | ICD-10-CM | POA: Diagnosis not present

## 2016-12-13 DIAGNOSIS — Z8619 Personal history of other infectious and parasitic diseases: Secondary | ICD-10-CM | POA: Diagnosis not present

## 2016-12-13 DIAGNOSIS — Z7952 Long term (current) use of systemic steroids: Secondary | ICD-10-CM | POA: Diagnosis not present

## 2016-12-13 DIAGNOSIS — Z5181 Encounter for therapeutic drug level monitoring: Secondary | ICD-10-CM | POA: Diagnosis not present

## 2016-12-13 DIAGNOSIS — Z792 Long term (current) use of antibiotics: Secondary | ICD-10-CM | POA: Diagnosis not present

## 2016-12-13 DIAGNOSIS — E119 Type 2 diabetes mellitus without complications: Secondary | ICD-10-CM | POA: Diagnosis not present

## 2016-12-13 DIAGNOSIS — G4733 Obstructive sleep apnea (adult) (pediatric): Secondary | ICD-10-CM | POA: Diagnosis not present

## 2016-12-13 DIAGNOSIS — D8989 Other specified disorders involving the immune mechanism, not elsewhere classified: Secondary | ICD-10-CM | POA: Diagnosis not present

## 2016-12-21 DIAGNOSIS — Z940 Kidney transplant status: Secondary | ICD-10-CM | POA: Diagnosis not present

## 2016-12-21 DIAGNOSIS — N200 Calculus of kidney: Secondary | ICD-10-CM | POA: Diagnosis not present

## 2016-12-22 ENCOUNTER — Ambulatory Visit (INDEPENDENT_AMBULATORY_CARE_PROVIDER_SITE_OTHER): Payer: Medicare Other | Admitting: Infectious Diseases

## 2016-12-22 ENCOUNTER — Ambulatory Visit: Payer: Medicare Other | Admitting: Infectious Diseases

## 2016-12-22 VITALS — Temp 98.2°F | Wt 263.0 lb

## 2016-12-22 DIAGNOSIS — B49 Unspecified mycosis: Secondary | ICD-10-CM | POA: Diagnosis not present

## 2016-12-22 DIAGNOSIS — Z5181 Encounter for therapeutic drug level monitoring: Secondary | ICD-10-CM | POA: Diagnosis not present

## 2016-12-22 DIAGNOSIS — Z9889 Other specified postprocedural states: Secondary | ICD-10-CM | POA: Diagnosis not present

## 2016-12-22 DIAGNOSIS — H539 Unspecified visual disturbance: Secondary | ICD-10-CM | POA: Diagnosis not present

## 2016-12-22 DIAGNOSIS — L089 Local infection of the skin and subcutaneous tissue, unspecified: Secondary | ICD-10-CM | POA: Diagnosis not present

## 2016-12-22 DIAGNOSIS — Z940 Kidney transplant status: Secondary | ICD-10-CM | POA: Diagnosis not present

## 2016-12-22 DIAGNOSIS — Z4789 Encounter for other orthopedic aftercare: Secondary | ICD-10-CM | POA: Diagnosis not present

## 2016-12-22 DIAGNOSIS — M25332 Other instability, left wrist: Secondary | ICD-10-CM | POA: Diagnosis not present

## 2016-12-22 NOTE — Progress Notes (Signed)
Jon [name] 1950/04/03 [id number] PCP: [name], MD  Referring Provider. Dr. Marzetta Merino   Reason for Visit: Fungal finger infection  Brief Narrative: Jon [name] is a 66 y.o. male with significant history of diabetes, kidney transplant complicated by early low-level BK viremia now on lifelong prednisone/prograf/mycophenolate and PJP prophylaxis. He was found to have fungal infection of his left middle finger following pathology report after left finger mass excision was performed by Dr. Marzetta Merino on 11/09/16. 2016 he was working with treated wood when he had a long splinter get lodged into his left middle finger. A few weeks after it healed a mobile/painless nodule emerged and 6 months after that a second nodule. This was drained in a doctor's office revealing dark almost black material with a strong odor. Unfortunately the area would re-accumulate within 24 hours. XRays pre op from what the patient tells me showed only superficial involvement and no compromise to the underlying bone or joint. Jon [name] also denies having had any pain or altered ROM of the finger. No fevers, malaise, unintentional weight loss or other signs of systemic illness.   I started him on empiric Voriconazole 200 mg BID 11/26/16. I contacted his transplant center to alert them with drug to drug interaction and request coordination for more frequent monitoring of levels.   He returns today after 1 month of therapy. Reports that shortly after I saw him in the office he returned to Dr. Burney Gauze after his finger swelled significantly and opened up draining bloody material (his wife had a picture today). Since then they have fitted him with compression sleeve and his finger has healed over. Black/necrotic tissue has receeded. Has experienced some floaters/dark spots in vision since starting vori and also reports swelling to his lower eye lids L > R. He and his wife are wondering if it is r/t new medication.    Patient Active Problem List   Diagnosis Date Noted  . Vision changes 12/30/2016  . Finger infection, fungal 11/22/2016  . Dyspnea on exertion 08/24/2016  . Embolic stroke ([place]) 62/03/5595  . HLD (hyperlipidemia) 05/02/2013  . OSA (obstructive sleep apnea) 05/02/2013  . Medication monitoring encounter 03/22/2013  . History of stroke 03/16/2013  . Movement disorder 03/16/2013  . Lumbar stenosis with neurogenic claudication 12/27/2012  . Chronic back pain 01/27/2011  . Tobacco use disorder in remission 01/27/2011  . HTN (hypertension) 01/27/2011  . History of kidney transplant 01/24/2011  . Hyperkalemia 01/23/2011  . Diabetes mellitus 01/23/2011  . Lower back pain 01/23/2011   Review of Systems  Constitutional: Negative for chills, diaphoresis, fever and weight loss.  HENT: Negative for tinnitus.   Eyes: Negative for blurred vision, double vision and photophobia.       Floaters and vision changes noted   Respiratory: Negative for cough and sputum production.   Cardiovascular: Negative for chest pain and orthopnea.  Gastrointestinal: Negative for diarrhea and vomiting.  Genitourinary: Negative for dysuria.  Musculoskeletal: Negative for myalgias.  Skin: Negative for rash.  Neurological: Negative for dizziness and headaches.  Endo/Heme/Allergies: Negative for polydipsia. Does not bruise/bleed easily.    Past Medical History:  Diagnosis Date  . Arthritis    back, hands   . Chronic back pain    radiculopathy and stenosis  . Chronic kidney disease    [name], awaiting Transplant   . CVA (cerebral infarction) 03/2013  . Diabetes mellitus     Lantus nightly ;type 2  . GERD (gastroesophageal reflux  disease)   . H/O hiatal hernia   . History of colon polyps   . Hx of cardiovascular stress test    a.  Lexiscan Myoview (05/2013):  No ischemia, EF 53%; Normal Study  . Hyperlipidemia    takes Lovastatin daily  . Hypertension    takes Amlodipine and Catapres daily    dr  Forrestine Him, [facility] - [facility]  . Nocturia   . Peripheral edema    takes Lasix daily  . Sleep apnea    cpap , study in their home, 09/2102- [facility]           . Stroke ([facility])    speech, rt arm weakness  . Urinary frequency    Outpatient Medications Prior to Visit  Medication Sig Dispense Refill  . amLODipine (NORVASC) 10 MG tablet Take 5 mg by mouth daily after breakfast.     . aspirin 81 MG tablet Take 81 mg by mouth daily.    [name] atorvastatin (LIPITOR) 40 MG tablet Take 40 mg by mouth daily.    . Blood Glucose Monitoring Suppl (ONE TOUCH ULTRA 2) w/Device KIT     . calcitRIOL (ROCALTROL) 0.25 MCG capsule TAKE 1 CAPSULE 5 TIMES PER WEEK  6  . furosemide (LASIX) 80 MG tablet Take 2 tablet and 1 tablet evening  6  . gabapentin (NEURONTIN) 300 MG capsule Take 300 mg by mouth 2 (two) times daily.    . insulin lispro (HUMALOG) 100 UNIT/ML injection Inject 2-16 Units into the skin See admin instructions. Inject 10 units subque 3 times daily with meals and Inject 2-16 units per sliding scale 3 times daily with meals.    [name] labetalol (NORMODYNE) 300 MG tablet Take 300 mg by mouth 3 (three) times daily.     [name] LANTUS SOLOSTAR 100 UNIT/ML Solostar Pen Inject 48 Units into the skin at bedtime.     . magnesium oxide (MAG-OX) 400 MG tablet Take 400 mg by mouth daily.    . Multiple Vitamin (MULTIVITAMIN) tablet Take 1 tablet by mouth daily.    . mycophenolate (MYFORTIC) 180 MG EC tablet Take 180 mg by mouth 2 (two) times daily.    [name] omeprazole (PRILOSEC) 20 MG capsule Take 20 mg by mouth daily.    . Potassium Citrate 15 MEQ (1620 MG) TBCR Take 1 tablet by mouth 3 (three) times daily.     . predniSONE (STERAPRED UNI-PAK) 5 MG TABS tablet Take 5 mg by mouth daily.    [name] sulfamethoxazole-trimethoprim (BACTRIM,SEPTRA) 400-80 MG tablet Take 1 tablet by mouth 3 (three) times a week. Monday, Wednesday, Friday    . tacrolimus (PROGRAF) 1 MG capsule Take 3 mg by mouth 2 (two) times daily.     . tamsulosin  (FLOMAX) 0.4 MG CAPS capsule Take 0.4 mg by mouth daily after breakfast.    . vitamin B-12 (CYANOCOBALAMIN) 1000 MCG tablet Take 2,000 mcg by mouth daily.    [name] voriconazole (VFEND) 200 MG tablet Take 1 tablet (200 mg total) 2 (two) times daily by mouth. 60 tablet 2  . HYDROcodone-acetaminophen (NORCO/VICODIN) 5-325 MG per tablet Take 1-2 tablets by mouth every 6 (six) hours as needed for moderate pain.      No facility-administered medications prior to visit.    Allergies  Allergen Reactions  . Lisinopril Other (See Comments)    Increased potassium level   . Meloxicam Nausea And Vomiting  . Victoza [Liraglutide] Diarrhea, Nausea And Vomiting and Swelling   Social History   Tobacco  Use  . Smoking status: Former Smoker    Packs/day: 0.50    Years: 40.00    Pack years: 20.00    Types: Cigarettes    Last attempt to quit: 12/27/2012    Years since quitting: 4.0  . Smokeless tobacco: Never Used  Substance Use Topics  . Alcohol use: No    Alcohol/week: 0.0 oz  . Drug use: No   Family History  Problem Relation Age of Onset  . Hypertension Mother   . Hyperlipidemia Father   . Hypertension Father   . Heart disease Father        before age 52  . Renal Disease Father   . Diabetes Brother   . Hyperlipidemia Son     OBJECTIVE: Vitals:   12/22/16 1345  Temp: 98.2 F (36.8 C)  TempSrc: Oral  Weight: 263 lb (119.3 kg)   Body mass index is 36.68 kg/m.  Physical Exam  Constitutional: He is oriented to person, place, and time and well-developed, well-nourished, and in no distress.  HENT:  Mouth/Throat: Oropharynx is clear and moist. No oral lesions. Normal dentition. No dental caries.  Eyes: Pupils are equal, round, and reactive to light. No scleral icterus.  Cardiovascular: Normal rate, regular rhythm and normal heart sounds.  Pulmonary/Chest: Effort normal and breath sounds normal. No respiratory distress.  Abdominal: Soft. He exhibits no distension. There is no tenderness.   Musculoskeletal: Normal range of motion.  No longer wearing cast/sling. Left #3 finger with improvement and recession of black tissue. Small linear dark scar with mild pink edges. No pain. 2+ radial pulse and excellent capillary refill. ROM unrestricted.   Lymphadenopathy:    He has no cervical adenopathy.  Neurological: He is alert and oriented to person, place, and time.  Skin: Skin is warm and dry. No rash noted.  Psychiatric: Mood and affect normal.       PATHOLOGY REPORT FROM 11/09/2016: CYSTIC FIBROHISTIOCYTIC GRANULOMATOUS INFLAMMATION AND FOREIGN BODY REACTION IN A BACKGROUND OF CHRONIC ACTIVE TENOSYNOVITIS.  - GMS AND PAS-F STAINS POSITIVE FOR FUNGAL ORGANISMS - AFB STAIN NEGATIVE  - NEGATIVE FOR MALIGNANCY  - FRAGMENTS OF REFRACTILE FOREIGN MATERIAL IDENTIFIED UNDER POLARIZED LIGHT  ASSESSMENT & PLAN:  Problem List Items Addressed This Visit      Other   Finger infection, fungal    Clinical improvement on voriconazole. Continue measures recommended for swelling per Dr. Burney Gauze. Will ask his podiatrist to send aspirate/drainage from right ankle for AFB, fungal culture, aerobic/anaerobic cultures. Likely unrelated. Will have him return in 6 weeks.       History of kidney transplant    Going weekly for prograf levels as directed by transplant center.       Medication monitoring encounter    Check random voriconazole level today Continue with prograf monitoring per transplant center instruction Check LFTs for toxicity monitoring on vori        Relevant Orders   Voriconazole, Quant by LC/MS (Completed)   Comprehensive metabolic panel (Completed)   Vision changes    Related to voriconazole and common side effect. I have placed referral for ophthalmology evaluation being this is a higher risk medication and he needs to remain on this for several months.        Other Visit Diagnoses    Fungus infection    -  Primary   Relevant Orders   Fungal antibodies panel,  id-blood (Completed)      Janene Madeira, MSN, [facility]  12/22/16, 1:59 PM

## 2016-12-22 NOTE — Patient Instructions (Addendum)
Should have formal eye exam in the next month if you can arrange this. Our fax # is [phone] for your eye exam records.   Will check some blood work for you today to make sure your liver is doing well on the medication.   Will send a message to your podiatrist with some recommended tests to run on the fluid around your ankle.   See you back in 6 weeks. Will call you with lab results if any changes are needed.

## 2016-12-22 NOTE — Assessment & Plan Note (Addendum)
Clinical improvement on voriconazole. Continue measures recommended for swelling per Dr. Burney Gauze. Will ask his podiatrist to send aspirate/drainage from right ankle for AFB, fungal culture, aerobic/anaerobic cultures. Likely unrelated. Will have him return in 6 weeks.

## 2016-12-22 NOTE — Assessment & Plan Note (Signed)
Going weekly for prograf levels as directed by transplant center.

## 2016-12-27 DIAGNOSIS — B349 Viral infection, unspecified: Secondary | ICD-10-CM | POA: Diagnosis not present

## 2016-12-27 DIAGNOSIS — B488 Other specified mycoses: Secondary | ICD-10-CM | POA: Diagnosis not present

## 2016-12-27 DIAGNOSIS — E119 Type 2 diabetes mellitus without complications: Secondary | ICD-10-CM | POA: Diagnosis not present

## 2016-12-27 DIAGNOSIS — Z4822 Encounter for aftercare following kidney transplant: Secondary | ICD-10-CM | POA: Diagnosis not present

## 2016-12-27 DIAGNOSIS — Z940 Kidney transplant status: Secondary | ICD-10-CM | POA: Diagnosis not present

## 2016-12-27 DIAGNOSIS — Z79899 Other long term (current) drug therapy: Secondary | ICD-10-CM | POA: Diagnosis not present

## 2016-12-27 DIAGNOSIS — D899 Disorder involving the immune mechanism, unspecified: Secondary | ICD-10-CM | POA: Diagnosis not present

## 2016-12-27 DIAGNOSIS — G4733 Obstructive sleep apnea (adult) (pediatric): Secondary | ICD-10-CM | POA: Diagnosis not present

## 2016-12-27 DIAGNOSIS — Z792 Long term (current) use of antibiotics: Secondary | ICD-10-CM | POA: Diagnosis not present

## 2016-12-27 DIAGNOSIS — R600 Localized edema: Secondary | ICD-10-CM | POA: Diagnosis not present

## 2016-12-27 DIAGNOSIS — E1122 Type 2 diabetes mellitus with diabetic chronic kidney disease: Secondary | ICD-10-CM | POA: Diagnosis not present

## 2016-12-27 DIAGNOSIS — I10 Essential (primary) hypertension: Secondary | ICD-10-CM | POA: Diagnosis not present

## 2016-12-27 DIAGNOSIS — N184 Chronic kidney disease, stage 4 (severe): Secondary | ICD-10-CM | POA: Diagnosis not present

## 2016-12-27 DIAGNOSIS — Z7952 Long term (current) use of systemic steroids: Secondary | ICD-10-CM | POA: Diagnosis not present

## 2016-12-27 DIAGNOSIS — Z8619 Personal history of other infectious and parasitic diseases: Secondary | ICD-10-CM | POA: Diagnosis not present

## 2016-12-27 DIAGNOSIS — Z5181 Encounter for therapeutic drug level monitoring: Secondary | ICD-10-CM | POA: Diagnosis not present

## 2016-12-27 DIAGNOSIS — Z794 Long term (current) use of insulin: Secondary | ICD-10-CM | POA: Diagnosis not present

## 2016-12-27 DIAGNOSIS — I129 Hypertensive chronic kidney disease with stage 1 through stage 4 chronic kidney disease, or unspecified chronic kidney disease: Secondary | ICD-10-CM | POA: Diagnosis not present

## 2016-12-27 DIAGNOSIS — R809 Proteinuria, unspecified: Secondary | ICD-10-CM | POA: Diagnosis not present

## 2016-12-27 DIAGNOSIS — I152 Hypertension secondary to endocrine disorders: Secondary | ICD-10-CM | POA: Diagnosis not present

## 2016-12-27 LAB — FUNGAL ANTIBODIES PANEL, ID-BLOOD
ASPERGILLUS FLAVUS AB: NEGATIVE
ASPERGILLUS FUMIGATUS: NEGATIVE
Aspergillus Niger Antibodies: NEGATIVE
BLASTOMYCES ABS, QN, DID: NEGATIVE
COCCIDIOIDES ANTIBODY ID: NEGATIVE
HISTOPLASMA AB ID: NEGATIVE

## 2016-12-27 LAB — COMPREHENSIVE METABOLIC PANEL
AG RATIO: 1.7 (calc) (ref 1.0–2.5)
ALBUMIN MSPROF: 4.2 g/dL (ref 3.6–5.1)
ALKALINE PHOSPHATASE (APISO): 107 U/L (ref 40–115)
ALT: 25 U/L (ref 9–46)
AST: 22 U/L (ref 10–35)
BUN / CREAT RATIO: 19 (calc) (ref 6–22)
BUN: 42 mg/dL — ABNORMAL HIGH (ref 7–25)
CHLORIDE: 109 mmol/L (ref 98–110)
CO2: 26 mmol/L (ref 20–32)
Calcium: 9.4 mg/dL (ref 8.6–10.3)
Creat: 2.21 mg/dL — ABNORMAL HIGH (ref 0.70–1.25)
GLUCOSE: 111 mg/dL — AB (ref 65–99)
Globulin: 2.5 g/dL (calc) (ref 1.9–3.7)
Potassium: 5.1 mmol/L (ref 3.5–5.3)
Sodium: 145 mmol/L (ref 135–146)
TOTAL PROTEIN: 6.7 g/dL (ref 6.1–8.1)
Total Bilirubin: 0.4 mg/dL (ref 0.2–1.2)

## 2016-12-27 LAB — VORICONAZOLE QUANT BY LC/MS: Voriconazole, Quant, by LC/MS: 1.7 ug/mL

## 2016-12-28 ENCOUNTER — Ambulatory Visit: Payer: Medicare Other | Admitting: Internal Medicine

## 2016-12-28 ENCOUNTER — Telehealth: Payer: Self-pay | Admitting: Internal Medicine

## 2016-12-28 ENCOUNTER — Encounter: Payer: Self-pay | Admitting: Internal Medicine

## 2016-12-28 ENCOUNTER — Ambulatory Visit (INDEPENDENT_AMBULATORY_CARE_PROVIDER_SITE_OTHER): Payer: Medicare Other | Admitting: Internal Medicine

## 2016-12-28 VITALS — BP 118/78 | HR 64 | Ht 70.5 in | Wt 268.0 lb

## 2016-12-28 DIAGNOSIS — G4733 Obstructive sleep apnea (adult) (pediatric): Secondary | ICD-10-CM

## 2016-12-28 DIAGNOSIS — R0609 Other forms of dyspnea: Secondary | ICD-10-CM | POA: Diagnosis not present

## 2016-12-28 DIAGNOSIS — F17200 Nicotine dependence, unspecified, uncomplicated: Secondary | ICD-10-CM

## 2016-12-28 LAB — PULMONARY FUNCTION TEST
DL/VA % pred: 96 %
DL/VA: 4.5 ml/min/mmHg/L
DLCO COR % PRED: 66 %
DLCO COR: 22.11 ml/min/mmHg
DLCO UNC % PRED: 66 %
DLCO unc: 22.05 ml/min/mmHg
FEF 25-75 POST: 3.04 L/s
FEF 25-75 PRE: 2.63 L/s
FEF2575-%CHANGE-POST: 15 %
FEF2575-%PRED-PRE: 95 %
FEF2575-%Pred-Post: 111 %
FEV1-%Change-Post: 4 %
FEV1-%Pred-Post: 75 %
FEV1-%Pred-Pre: 72 %
FEV1-POST: 2.64 L
FEV1-Pre: 2.54 L
FEV1FVC-%CHANGE-POST: 0 %
FEV1FVC-%PRED-PRE: 108 %
FEV6-%CHANGE-POST: 5 %
FEV6-%Pred-Post: 74 %
FEV6-%Pred-Pre: 70 %
FEV6-Post: 3.28 L
FEV6-Pre: 3.11 L
FEV6FVC-%Change-Post: 0 %
FEV6FVC-%Pred-Post: 105 %
FEV6FVC-%Pred-Pre: 104 %
FVC-%Change-Post: 4 %
FVC-%PRED-POST: 70 %
FVC-%Pred-Pre: 66 %
FVC-PRE: 3.12 L
FVC-Post: 3.28 L
POST FEV1/FVC RATIO: 81 %
PRE FEV1/FVC RATIO: 81 %
Post FEV6/FVC ratio: 100 %
Pre FEV6/FVC Ratio: 100 %
RV % pred: 102 %
RV: 2.44 L
TLC % PRED: 77 %
TLC: 5.49 L

## 2016-12-28 NOTE — Assessment & Plan Note (Signed)
He has been compliant with CPAP and sleeping better.  He wants to change DME next year and replace CPAP machine at that time.  Meanwhile [facility] has agreed to replace supplies.

## 2016-12-28 NOTE — Progress Notes (Signed)
PFT done today. 

## 2016-12-28 NOTE — Assessment & Plan Note (Signed)
Mild restriction may reflect his obesity.  Dyspnea multifactorial with components from his heart disease and stroke with deconditioning, and obesity.  Last chest x-ray showed mild cardiomegaly without significant parenchymal abnormality but as a former smoker he likely has at least some emphysema. Bronchodilator given during PFT today it made him feel a little better even though response and measured flows was not significant. Plan-try samples of LABA/LAMA Anoro.  He will keep his follow-up eye appointment in January and have pressures rechecked then while on Anoro.

## 2016-12-28 NOTE — Telephone Encounter (Signed)
Katie did you call [facility]? I am not sure what you needed. Please advise.

## 2016-12-28 NOTE — Patient Instructions (Signed)
Order- [facility]- order replacement CPAP mask of choice, supplies,     Dx OSA  Next year, when you are eligible, we will get [facility] to get you a replacement CPAP machine  Sample x 2 Anoro inhaler      Inhale 1 puff, once daily maintenance When you see your eye doctor January 10, they can recheck you for glaucoma to make sure the Anoro isn't making it worse.  Please call as needed

## 2016-12-28 NOTE — Assessment & Plan Note (Signed)
He remains abstinent and I think he will be able to stay off permanently.

## 2016-12-28 NOTE — Progress Notes (Signed)
HPI  male former smoker followed for OSA, complicated by ESRD/Transplant, DM 2, GERD, CVA, chronic back pain Unattended Home Sleep Test 10/24/12-AHI 24/hour, desaturation to 81%. PFT 12/28/16-mild restriction, mild reduction of diffusion.  No response to bronchodilator.  FVC 3.28/70%, FEV1 2.64/75%, ratio 0.81, TLC 77%, DLCO 66%. -------------------------------------------------------------------------------  08/24/16-66 year old male former smoker followed for OSA, complicated by ESRD/transplant, DM 2, GERD, CVA, chronic back pain Unattended Home Sleep Test 10/24/12-[facility] 24/hour, desaturation to 81%. CPAP titration 07/24/16- 14 CWP, severe periodic limb movement syndrome, body weight 249 pounds FOLLOWS FOR: DME  [facility]. Pt currently wears CPAP but had Titration study to help with pressure settings. Download not current but indicates he was using [employer] capable machine with a peak pressure at 9.2. He says he uses CPAP every night and sleeps much better with it.  He denies routine cough or wheeze but admits dyspnea on exertion with daily activities.  12/28/16-66 year old male former smoker followed for OSA, DOE complicated by ESRD/transplant, DM 2, GERD, CVA, chronic back pain CPAP [facility]> wants change to [facility] next year with replacement machine at that time. OSA; DME: [facility] but needs to change to [facility] for supplies only and then order new machine 10/2017.  Download not available.  Planning change to [facility] when he is due for replacement machine next year.  Meanwhile he and wife describe good compliance and control.  Rare snoring and no daytime sleepiness. PFT done for DOE and discussed today.  He may have borderline glaucoma.  We can try Anoro and he will get eye check in January. PFT 12/28/16-mild restriction, mild reduction of diffusion.  No response to bronchodilator.  FVC 3.28/70%, FEV1 2.64/75%, ratio 0.81, TLC 77%, DLCO 66%.  ROS-see HPI   + = positive Constitutional:    weight  loss, night sweats, fevers, chills, fatigue, lassitude. HEENT:    headaches, difficulty swallowing, tooth/dental problems, sore throat,       sneezing, itching, ear ache, nasal congestion, post nasal drip, snoring CV:    chest pain, orthopnea, PND, swelling in lower extremities, anasarca,                                                     dizziness, palpitations Resp:   +shortness of breath with exertion or at rest.                productive cough,   non-productive cough, coughing up of blood.              change in color of mucus.  wheezing.   Skin:    rash or lesions. GI:  No-   heartburn, indigestion, abdominal pain, nausea, vomiting, diarrhea,                 change in bowel habits, loss of appetite GU: dysuria, change in color of urine, no urgency or frequency.   flank pain. MS:   joint pain, stiffness, decreased range of motion, back pain. Neuro-     nothing unusual Psych:  change in mood or affect.  depression or anxiety.   memory loss.  OBJ- Physical Exam General- Alert, Oriented, Affect-appropriate, Distress- none acute, +overweight Skin- rash-none, lesions- none, excoriation- none Lymphadenopathy- none Head- atraumatic            Eyes- Gross vision intact, PERRLA, conjunctivae and secretions clear  Ears- Hearing, canals-normal            Nose- Clear, no-Septal dev, mucus, polyps, erosion, perforation             Throat- Mallampati IV , mucosa clear , drainage- none, tonsils- atrophic, + full dentures Neck- flexible , trachea midline, no stridor , thyroid nl, carotid no bruit Chest - symmetrical excursion , unlabored           Heart/CV- RRR , murmur+ 1-2/6 S Ao , no gallop  , no rub, nl s1 s2                           - JVD- none , edema- none, stasis changes- none, varices- none           Lung- clear to P&A, wheeze- none, cough- none , dullness-none, rub- none           Chest wall- + loop recorder Abd-  Br/ Gen/ Rectal- Not done, not indicated Extrem- cyanosis-  none, clubbing, none, atrophy- none, strength- nl Neuro- grossly intact to observation

## 2016-12-29 NOTE — Telephone Encounter (Signed)
Phone call no longer needed; we have changed DME's for patient CPAP needs.

## 2016-12-30 DIAGNOSIS — H539 Unspecified visual disturbance: Secondary | ICD-10-CM | POA: Insufficient documentation

## 2016-12-30 NOTE — Assessment & Plan Note (Signed)
Check random voriconazole level today Continue with prograf monitoring per transplant center instruction Check LFTs for toxicity monitoring on vori

## 2016-12-30 NOTE — Assessment & Plan Note (Signed)
Related to voriconazole and common side effect. I have placed referral for ophthalmology evaluation being this is a higher risk medication and he needs to remain on this for several months.

## 2017-01-01 ENCOUNTER — Other Ambulatory Visit: Payer: Self-pay

## 2017-01-01 ENCOUNTER — Emergency Department (HOSPITAL_COMMUNITY): Payer: Medicare Other

## 2017-01-01 ENCOUNTER — Encounter (HOSPITAL_COMMUNITY): Payer: Self-pay | Admitting: *Deleted

## 2017-01-01 ENCOUNTER — Inpatient Hospital Stay (HOSPITAL_COMMUNITY)
Admission: EM | Admit: 2017-01-01 | Discharge: 2017-01-04 | DRG: 291 | Disposition: A | Discharge status: Home / Self Care | Payer: Medicare Other | Attending: Family Medicine | Admitting: Family Medicine

## 2017-01-01 DIAGNOSIS — N179 Acute kidney failure, unspecified: Secondary | ICD-10-CM | POA: Diagnosis present

## 2017-01-01 DIAGNOSIS — Z79899 Other long term (current) drug therapy: Secondary | ICD-10-CM

## 2017-01-01 DIAGNOSIS — E875 Hyperkalemia: Secondary | ICD-10-CM | POA: Diagnosis present

## 2017-01-01 DIAGNOSIS — R0609 Other forms of dyspnea: Secondary | ICD-10-CM | POA: Diagnosis present

## 2017-01-01 DIAGNOSIS — E785 Hyperlipidemia, unspecified: Secondary | ICD-10-CM | POA: Diagnosis present

## 2017-01-01 DIAGNOSIS — R0789 Other chest pain: Secondary | ICD-10-CM | POA: Diagnosis not present

## 2017-01-01 DIAGNOSIS — I10 Essential (primary) hypertension: Secondary | ICD-10-CM | POA: Diagnosis not present

## 2017-01-01 DIAGNOSIS — G4733 Obstructive sleep apnea (adult) (pediatric): Secondary | ICD-10-CM | POA: Diagnosis not present

## 2017-01-01 DIAGNOSIS — N189 Chronic kidney disease, unspecified: Secondary | ICD-10-CM | POA: Diagnosis not present

## 2017-01-01 DIAGNOSIS — Z8673 Personal history of transient ischemic attack (TIA), and cerebral infarction without residual deficits: Secondary | ICD-10-CM | POA: Diagnosis not present

## 2017-01-01 DIAGNOSIS — Z794 Long term (current) use of insulin: Secondary | ICD-10-CM | POA: Diagnosis not present

## 2017-01-01 DIAGNOSIS — I5033 Acute on chronic diastolic (congestive) heart failure: Secondary | ICD-10-CM | POA: Diagnosis not present

## 2017-01-01 DIAGNOSIS — Z7982 Long term (current) use of aspirin: Secondary | ICD-10-CM

## 2017-01-01 DIAGNOSIS — B352 Tinea manuum: Secondary | ICD-10-CM | POA: Diagnosis present

## 2017-01-01 DIAGNOSIS — E119 Type 2 diabetes mellitus without complications: Secondary | ICD-10-CM

## 2017-01-01 DIAGNOSIS — N184 Chronic kidney disease, stage 4 (severe): Secondary | ICD-10-CM | POA: Diagnosis not present

## 2017-01-01 DIAGNOSIS — R079 Chest pain, unspecified: Secondary | ICD-10-CM | POA: Diagnosis not present

## 2017-01-01 DIAGNOSIS — Z87891 Personal history of nicotine dependence: Secondary | ICD-10-CM

## 2017-01-01 DIAGNOSIS — E7800 Pure hypercholesterolemia, unspecified: Secondary | ICD-10-CM | POA: Diagnosis not present

## 2017-01-01 DIAGNOSIS — K219 Gastro-esophageal reflux disease without esophagitis: Secondary | ICD-10-CM | POA: Diagnosis not present

## 2017-01-01 DIAGNOSIS — R601 Generalized edema: Secondary | ICD-10-CM | POA: Diagnosis present

## 2017-01-01 DIAGNOSIS — M898X9 Other specified disorders of bone, unspecified site: Secondary | ICD-10-CM | POA: Diagnosis not present

## 2017-01-01 DIAGNOSIS — E118 Type 2 diabetes mellitus with unspecified complications: Secondary | ICD-10-CM | POA: Diagnosis not present

## 2017-01-01 DIAGNOSIS — E1121 Type 2 diabetes mellitus with diabetic nephropathy: Secondary | ICD-10-CM | POA: Diagnosis present

## 2017-01-01 DIAGNOSIS — I2729 Other secondary pulmonary hypertension: Secondary | ICD-10-CM | POA: Diagnosis present

## 2017-01-01 DIAGNOSIS — I5082 Biventricular heart failure: Secondary | ICD-10-CM | POA: Diagnosis not present

## 2017-01-01 DIAGNOSIS — T8619 Other complication of kidney transplant: Secondary | ICD-10-CM | POA: Diagnosis present

## 2017-01-01 DIAGNOSIS — I130 Hypertensive heart and chronic kidney disease with heart failure and stage 1 through stage 4 chronic kidney disease, or unspecified chronic kidney disease: Secondary | ICD-10-CM | POA: Diagnosis not present

## 2017-01-01 DIAGNOSIS — R609 Edema, unspecified: Secondary | ICD-10-CM | POA: Diagnosis not present

## 2017-01-01 DIAGNOSIS — Z888 Allergy status to other drugs, medicaments and biological substances status: Secondary | ICD-10-CM

## 2017-01-01 DIAGNOSIS — I132 Hypertensive heart and chronic kidney disease with heart failure and with stage 5 chronic kidney disease, or end stage renal disease: Secondary | ICD-10-CM | POA: Diagnosis not present

## 2017-01-01 DIAGNOSIS — E1122 Type 2 diabetes mellitus with diabetic chronic kidney disease: Secondary | ICD-10-CM | POA: Diagnosis present

## 2017-01-01 DIAGNOSIS — Z6838 Body mass index (BMI) 38.0-38.9, adult: Secondary | ICD-10-CM

## 2017-01-01 DIAGNOSIS — Z940 Kidney transplant status: Secondary | ICD-10-CM

## 2017-01-01 DIAGNOSIS — L089 Local infection of the skin and subcutaneous tissue, unspecified: Secondary | ICD-10-CM | POA: Diagnosis not present

## 2017-01-01 DIAGNOSIS — R05 Cough: Secondary | ICD-10-CM | POA: Diagnosis not present

## 2017-01-01 LAB — HEPATIC FUNCTION PANEL
ALK PHOS: 106 U/L (ref 38–126)
ALT: 31 U/L (ref 17–63)
AST: 28 U/L (ref 15–41)
Albumin: 3.9 g/dL (ref 3.5–5.0)
BILIRUBIN DIRECT: 0.1 mg/dL (ref 0.1–0.5)
BILIRUBIN INDIRECT: 0.4 mg/dL (ref 0.3–0.9)
BILIRUBIN TOTAL: 0.5 mg/dL (ref 0.3–1.2)
Total Protein: 7 g/dL (ref 6.5–8.1)

## 2017-01-01 LAB — BASIC METABOLIC PANEL
Anion gap: 8 (ref 5–15)
BUN: 42 mg/dL — AB (ref 6–20)
CHLORIDE: 106 mmol/L (ref 101–111)
CO2: 23 mmol/L (ref 22–32)
CREATININE: 2.54 mg/dL — AB (ref 0.61–1.24)
Calcium: 8.7 mg/dL — ABNORMAL LOW (ref 8.9–10.3)
GFR calc Af Amer: 29 mL/min — ABNORMAL LOW (ref >60)
GFR calc non Af Amer: 25 mL/min — ABNORMAL LOW (ref >60)
Glucose, Bld: 108 mg/dL — ABNORMAL HIGH (ref 65–99)
Potassium: 5.6 mmol/L — ABNORMAL HIGH (ref 3.5–5.1)
Sodium: 137 mmol/L (ref 135–145)

## 2017-01-01 LAB — URINALYSIS, ROUTINE W REFLEX MICROSCOPIC
BILIRUBIN URINE: NEGATIVE
Glucose, UA: NEGATIVE mg/dL
HGB URINE DIPSTICK: NEGATIVE
Ketones, ur: NEGATIVE mg/dL
Leukocytes, UA: NEGATIVE
Nitrite: NEGATIVE
PROTEIN: NEGATIVE mg/dL
SPECIFIC GRAVITY, URINE: 1.01 (ref 1.005–1.030)
pH: 7 (ref 5.0–8.0)

## 2017-01-01 LAB — CBC
HCT: 37.8 % — ABNORMAL LOW (ref 39.0–52.0)
Hemoglobin: 12.3 g/dL — ABNORMAL LOW (ref 13.0–17.0)
MCH: 30.1 pg (ref 26.0–34.0)
MCHC: 32.5 g/dL (ref 30.0–36.0)
MCV: 92.4 fL (ref 78.0–100.0)
PLATELETS: 114 10*3/uL — AB (ref 150–400)
RBC: 4.09 MIL/uL — ABNORMAL LOW (ref 4.22–5.81)
RDW: 16.3 % — AB (ref 11.5–15.5)
WBC: 8.8 10*3/uL (ref 4.0–10.5)

## 2017-01-01 LAB — I-STAT TROPONIN, ED
TROPONIN I, POC: 0 ng/mL (ref 0.00–0.08)
Troponin i, poc: 0 ng/mL (ref 0.00–0.08)

## 2017-01-01 NOTE — ED Notes (Signed)
Pt in XRAY 

## 2017-01-01 NOTE — Consult Note (Addendum)
CARDIOLOGY CONSULT NOTE   Referring Physician: Dr. Tyrone Nine Primary Physician: Dr. Lin Landsman, Donley Redder, MD Primary Cardiologist: None  Reason for Consultation: exertional chest pain  HPI:  Patient Is a 66 y/o caucasian M with hx of CKD s/p renal transplant 2/2 to diabetic nephropathy, HTN, HLP, Type 2 DM, CVA, OSA on CPAP comes in today for chest pain that started earlier this afternoon. He was out cutting tees and on walking back to the house noticed 9/10 substernal chest pressure that was associated with shortness of breath. Symptoms lasted a few minutes and improved with rest. Then, patient went to shower and experienced similar discomfort lasting a similar amount in duration. At this point he decided to come to the ER for further testing. On his walk from the car to the ER he had significant chest pressure, similar to the one he's been experiencing all day. Pain improved once he rested on the bed.  He Is currently pain feel and resting comfortably in bed. 2 sets of troponin's are negative in the ER.  He added that he's been having trouble with fluid overload for the past week. He recently saw his nephrologist Dr. Claiborne Billings who asked him to double his lasix. While this helped with his urine output, it did not help with the edema. He states that he's been having worsening edema and increased SOB with exertion more than baseline. Per family members nephrology is aware of the increase in Cr.  He's never had chest pain like this in the past and denies any hx of cardiac problems.  Review of Systems:     Cardiac Review of Systems: {x] = yes [ ]  = no  Chest Pain [  x  ]  Resting SOB [   ] Exertional SOB  [ x ]  Orthopnea [  ]   Pedal Edema [ x  ]    Palpitations [  ] Syncope  [  ]   Presyncope [   ]  General Review of Systems: [x]  = yes [  ]=no Constitional: recent weight change [ x ]; anorexia [  ]; fatigue [  ]; nausea [  ]; night sweats [  ]; fever [  ]; or chills [  ];                                                                      Eyes : blurred vision [  ]; diplopia [   ]; vision changes [  ];  Amaurosis fugax[  ]; Resp: cough [x  ];  wheezing[  ];  hemoptysis[  ];  PND [  ];  GI:  gallstones[  ], vomiting[  ];  dysphagia[  ]; melena[  ];  hematochezia [  ]; heartburn[  ];   GU: kidney stones [  ]; hematuria[  ];   dysuria [  ];  nocturia[  ]; incontinence [  ];             Skin: rash, swelling[  ];, hair loss[  ];  peripheral edema[  ];  or itching[  ]; Musculosketetal: myalgias[  ];  joint swelling[  ];  joint erythema[  ];  joint pain[  ];  back pain[  ];  Heme/Lymph: bruising[  ];  bleeding[  ];  anemia[  ];  Neuro: TIA[  ];  headaches[  ];  stroke[  ];  vertigo[  ];  seizures[  ];   paresthesias[  ];  difficulty walking[  ];  Psych:depression[  ]; anxiety[  ];  Endocrine: diabetes[ x ];  thyroid dysfunction[  ];  Other:  Past Medical History:  Diagnosis Date  . Arthritis    back, hands   . Chronic back pain    radiculopathy and stenosis  . Chronic kidney disease    [name] awaiting Transplant   . CVA (cerebral infarction) 03/2013  . Diabetes mellitus     Lantus nightly ;type 2  . GERD (gastroesophageal reflux disease)   . H/O hiatal hernia   . History of colon polyps   . Hx of cardiovascular stress test    a.  Lexiscan Myoview (05/2013):  No ischemia, EF 53%; Normal Study  . Hyperlipidemia    takes Lovastatin daily  . Hypertension    takes Amlodipine and Catapres daily    dr Forrestine Him, Loop Recorder - [facility]  . Nocturia   . Peripheral edema    takes Lasix daily  . Sleep apnea    cpap , study in their home, 09/2102- [facility]           . Stroke (HCC)    speech, rt arm weakness  . Urinary frequency    Home meds include:  Amlodipine 10mg , Qday ASA 81mg  Lipitor 80mg , Qday Lasix 80mg  take 2 tabs and 1 tab Labetolol 300mg , TID Mycophenolate 180mg , qday Tacrolimus 1mg , qday  (Not in a hospital admission)  Infusions:  Allergies  Allergen  Reactions  . Lisinopril Other (See Comments)    Increased potassium level   . Meloxicam Nausea And Vomiting  . Victoza [Liraglutide] Diarrhea, Nausea And Vomiting and Swelling   Social History   Socioeconomic History  . Marital status: Married    Spouse name: agnes  . Number of children: 5  . Years of education: 8th  . Highest education level: Not on file  Social Needs  . Financial resource strain: Not on file  . Food insecurity - worry: Not on file  . Food insecurity - inability: Not on file  . Transportation needs - medical: Not on file  . Transportation needs - non-medical: Not on file  Occupational History  . Occupation: disabled  Tobacco Use  . Smoking status: Former Smoker    Packs/day: 0.50    Years: 40.00    Pack years: 20.00    Types: Cigarettes    Last attempt to quit: 12/27/2012    Years since quitting: 4.0  . Smokeless tobacco: Never Used  Substance and Sexual Activity  . Alcohol use: No    Alcohol/week: 0.0 oz  . Drug use: No  . Sexual activity: Yes  Other Topics Concern  . Not on file  Social History Narrative   Patient lives at home with his wife Herbert Pun).  One story home with basement.   Patient is disabled.   Education 8th grade.   Right handed.   Caffeine One cup of coffee daily.   Retired Administrator.   5 children.   Family History  Problem Relation Age of Onset  . Hypertension Mother   . Hyperlipidemia Father   . Hypertension Father   . Heart disease Father        before age 19  . Renal Disease Father   . Diabetes Brother   . Hyperlipidemia Son  PHYSICAL EXAM: Vitals:   01/01/17 2245 01/01/17 2300  BP: (!) 153/76 (!) 150/77  Pulse: 72 63  Resp: 13 18  Temp:    SpO2: 93% 96%   No intake or output data in the 24 hours ending 01/01/17 2325  General:  Well appearing. No respiratory difficulty HEENT: normal Neck: supple. Difficult to appreciate JVD due to thick neck Cor: PMI nondisplaced. Regular rate & rhythm. No rubs,  gallops or murmurs. Lungs: mild bilateral rales Abdomen: soft, nontender, distended. No hepatosplenomegaly. No bruits or masses. Good bowel sounds. Extremities: 2+ pitting edema b/l lower extremities Neuro: alert & oriented x 3, cranial nerves grossly intact. moves all 4 extremities w/o difficulty. Affect pleasant.  ECG:  Results for orders placed or performed during the hospital encounter of 01/01/17 (from the past 24 hour(s))  Basic metabolic panel     Status: Abnormal   Collection Time: 01/01/17  7:00 PM  Result Value Ref Range   Sodium 137 135 - 145 mmol/L   Potassium 5.6 (H) 3.5 - 5.1 mmol/L   Chloride 106 101 - 111 mmol/L   CO2 23 22 - 32 mmol/L   Glucose, Bld 108 (H) 65 - 99 mg/dL   BUN 42 (H) 6 - 20 mg/dL   Creatinine, Ser 2.54 (H) 0.61 - 1.24 mg/dL   Calcium 8.7 (L) 8.9 - 10.3 mg/dL   GFR calc non Af Amer 25 (L) >60 mL/min   GFR calc Af Amer 29 (L) >60 mL/min   Anion gap 8 5 - 15  CBC     Status: Abnormal   Collection Time: 01/01/17  7:00 PM  Result Value Ref Range   WBC 8.8 4.0 - 10.5 K/uL   RBC 4.09 (L) 4.22 - 5.81 MIL/uL   Hemoglobin 12.3 (L) 13.0 - 17.0 g/dL   HCT 37.8 (L) 39.0 - 52.0 %   MCV 92.4 78.0 - 100.0 fL   MCH 30.1 26.0 - 34.0 pg   MCHC 32.5 30.0 - 36.0 g/dL   RDW 16.3 (H) 11.5 - 15.5 %   Platelets 114 (L) 150 - 400 K/uL  I-stat troponin, ED     Status: None   Collection Time: 01/01/17  7:00 PM  Result Value Ref Range   Troponin i, poc 0.00 0.00 - 0.08 ng/mL   Comment 3          Hepatic function panel     Status: None   Collection Time: 01/01/17  8:24 PM  Result Value Ref Range   Total Protein 7.0 6.5 - 8.1 g/dL   Albumin 3.9 3.5 - 5.0 g/dL   AST 28 15 - 41 U/L   ALT 31 17 - 63 U/L   Alkaline Phosphatase 106 38 - 126 U/L   Total Bilirubin 0.5 0.3 - 1.2 mg/dL   Bilirubin, Direct 0.1 0.1 - 0.5 mg/dL   Indirect Bilirubin 0.4 0.3 - 0.9 mg/dL  I-stat troponin, ED     Status: None   Collection Time: 01/01/17  9:45 PM  Result Value Ref Range    Troponin i, poc 0.00 0.00 - 0.08 ng/mL   Comment 3          Urinalysis, Routine w reflex microscopic     Status: Abnormal   Collection Time: 01/01/17 10:12 PM  Result Value Ref Range   Color, Urine STRAW (A) YELLOW   APPearance CLEAR CLEAR   Specific Gravity, Urine 1.010 1.005 - 1.030   pH 7.0 5.0 - 8.0   Glucose, UA  NEGATIVE NEGATIVE mg/dL   Hgb urine dipstick NEGATIVE NEGATIVE   Bilirubin Urine NEGATIVE NEGATIVE   Ketones, ur NEGATIVE NEGATIVE mg/dL   Protein, ur NEGATIVE NEGATIVE mg/dL   Nitrite NEGATIVE NEGATIVE   Leukocytes, UA NEGATIVE NEGATIVE   Dg Chest 2 View  Result Date: 01/01/2017 CLINICAL DATA:  Pt reports swelling to entire body x 2 days; CP, cough, and SOB since this morning. Nonsmoker. Hx of DM and HTN. EXAM: CHEST  2 VIEW COMPARISON:  03/16/2013 FINDINGS: Cardiac silhouette is normal in size. Loop recorder projects over the left heart. No mediastinal or hilar masses. No evidence of adenopathy. There are mildly prominent bronchovascular markings diffusely. Lungs otherwise clear. No pleural effusion or pneumothorax. Skeletal structures are intact. IMPRESSION: No active cardiopulmonary disease. Electronically Signed   By: Lajean Manes M.D.   On: 01/01/2017 19:32   ASSESSMENT:  Exertional chest pain, concerning for progressive angina AKI CKD s/p b/l renal transplant in 2015 2/2 to diabetic renal disease Hx of ischemic CVA PAD HTN, HLP, Type 2 DM Recent fungal L middle finger infection on Voriconazole  PLAN/DISCUSSION:  EKG did not show any significant ST-T changes concerning for active ischemia. Additionally, two sets of initial troponin's were negative. However, patients symptoms did sound concerning. It's hard to differentiate b/w elevated filling pressures in the setting of generalized anasarca vs. Angina. He certainly has all the risk factors for CAD. Given the fact that he has renal transplant with elevated Cr, he's not a straight forward diagnostic cath patient.     He had a negative nuclear stress test in 2015 prior to the renal transplant.   Recommend nephrology consult and defer recommendation of diuresis to nephrology given that patient is a transplant candidate, but he appears significantly volume overloaded.  U/A does not indicate nephrotic syndrome. Check albumin. Not a high risk patient for PE's. Echo will help with RV assessment.  If patient continues to have chest discomfort despite adequately being diuresed or there are notable changes on echo/troponin leak will consider cath after discussion with nephrology.   For the time continue with medical management. Cont asa, statin increase to 80mg , labetalol 300mg , TID, amlodipine 10mg , qday. Will repeat EKG and obtain echo in the AM. Will also check lipid panel, TSH, A1c.   If pain occurs at rest consider initiation of heparin and ntg.  Patient is on immunosuppressive medications for the renal transplant.   Cardiology will continue to follow.  Willeen Cass Cardiology fellow

## 2017-01-01 NOTE — ED Triage Notes (Addendum)
Pt reports swelling to entire body x 2 today. Onset today of mid chest pain and sob. ekg done on arrival and airway is intact. Also has right side back pain under his shoulder and it radiates through to his chest.

## 2017-01-01 NOTE — ED Notes (Signed)
Delay in lab draw,  Admitting MD at bedside. 

## 2017-01-01 NOTE — ED Provider Notes (Signed)
[facility] EMERGENCY DEPARTMENT Provider Note   CSN: [id number] Arrival date & time: 01/01/17  1837     History   Chief Complaint Chief Complaint  Patient presents with  . Chest Pain  . Shortness of Breath    HPI Jon [name] is a 66 y.o. male.  The history is provided by the patient.  Chest Pain   This is a new problem. The current episode started more than 2 days ago. The problem occurs constantly. The problem has been gradually worsening. The pain is associated with exertion. The pain is present in the substernal region. The pain is at a severity of 9/10. The pain is moderate. The quality of the pain is described as brief, exertional and heavy. The pain does not radiate. Duration of episode(s) is 20 minutes. Associated symptoms include shortness of breath. Pertinent negatives include no abdominal pain, no fever, no headaches, no palpitations and no vomiting.  Pertinent negatives for past medical history include no DVT, no MI and no PE.  Shortness of Breath  Associated symptoms include chest pain and leg swelling. Pertinent negatives include no fever, no headaches, no vomiting, no abdominal pain and no rash. Associated medical issues do not include PE, past MI or DVT.    Past Medical History:  Diagnosis Date  . Arthritis    back, hands   . Chronic back pain    radiculopathy and stenosis  . Chronic kidney disease    [name] awaiting Transplant   . CVA (cerebral infarction) 03/2013  . Diabetes mellitus     Lantus nightly ;type 2  . GERD (gastroesophageal reflux disease)   . H/O hiatal hernia   . History of colon polyps   . Hx of cardiovascular stress test    a.  Lexiscan Myoview (05/2013):  No ischemia, EF 53%; Normal Study  . Hyperlipidemia    takes Lovastatin daily  . Hypertension    takes Amlodipine and Catapres daily    dr Forrestine Him, Loop Recorder - [facility]  . Nocturia   . Peripheral edema    takes Lasix daily  . Sleep apnea    cpap  , study in their home, 09/2102- [facility]           . Stroke (HCC)    speech, rt arm weakness  . Urinary frequency     Patient Active Problem List   Diagnosis Date Noted  . Vision changes 12/30/2016  . Finger infection, fungal 11/22/2016  . Dyspnea on exertion 08/24/2016  . Embolic stroke ([place]) 13/08/6576  . HLD (hyperlipidemia) 05/02/2013  . OSA (obstructive sleep apnea) 05/02/2013  . Medication monitoring encounter 03/22/2013  . History of stroke 03/16/2013  . Movement disorder 03/16/2013  . Lumbar stenosis with neurogenic claudication 12/27/2012  . Chronic back pain 01/27/2011  . Tobacco use disorder in remission 01/27/2011  . HTN (hypertension) 01/27/2011  . History of kidney transplant 01/24/2011  . Hyperkalemia 01/23/2011  . Diabetes mellitus 01/23/2011  . Lower back pain 01/23/2011    Past Surgical History:  Procedure Laterality Date  . AV FISTULA PLACEMENT Left 04/03/2013   Procedure: ARTERIOVENOUS (AV) FISTULA CREATION- LEFT RADIOCEPHALIC VS BRACHIOCEPHALIC;  Surgeon: Conrad Dunnell, MD;  Location: [facility];  Service: Vascular;  Laterality: Left;  . AV FISTULA PLACEMENT Left 05/14/2013   Procedure: LEFT BRACHIOCEPHALIC ARTERIOVENOUS (AV) FISTULA CREATION;  Surgeon: Conrad Hillside, MD;  Location: [facility];  Service: Vascular;  Laterality: Left;  . BACK SURGERY  12/2012   2003-  1st back surgery & then 2014- fusion  . COLONOSCOPY    . ESOPHAGOGASTRODUODENOSCOPY    . HEMORRHOID SURGERY    . KIDNEY TRANSPLANT  2016  . left knee surgery    . LOOP RECORDER IMPLANT  03/19/13   MDT LinQ implanted by Dr Rayann Heman for cryptogenic stroke  . LOOP RECORDER IMPLANT N/A 03/19/2013   Procedure: LOOP RECORDER IMPLANT;  Surgeon: Coralyn Mark, MD;  Location: [facility] CATH LAB;  Service: Cardiovascular;  Laterality: N/A;  . NEPHRECTOMY TRANSPLANTED ORGAN    . NEUROPLASTY / TRANSPOSITION MEDIAN NERVE AT CARPAL TUNNEL BILATERAL    . right leg surgery     pin in place  . right wrist surgery    . TEE  WITHOUT CARDIOVERSION N/A 03/19/2013   Procedure: TRANSESOPHAGEAL ECHOCARDIOGRAM (TEE);  Surgeon: [name], MD;  Location: [facility] ENDOSCOPY;  Service: Cardiovascular;  Laterality: N/A;       Home Medications    Prior to Admission medications   Medication Sig Start Date End Date Taking? Authorizing Provider  amLODipine (NORVASC) 10 MG tablet Take 5 mg by mouth daily after breakfast.    Yes [provider]  aspirin EC 81 MG tablet Take 81 mg by mouth at bedtime.   Yes [provider]  atorvastatin (LIPITOR) 40 MG tablet Take 40 mg by mouth at bedtime.    Yes [provider]  calcitRIOL (ROCALTROL) 0.25 MCG capsule Take 0.25 mcg by mouth See admin instructions. Take 1 capsule (0.25 mcg) by mouth five times daily - Monday thru Friday   Yes [provider]  furosemide (LASIX) 80 MG tablet Take 80 mg by mouth See admin instructions. Take 1 tablet (80 mg) by mouth twice daily - morning and lunch time 08/11/16  Yes [provider]  gabapentin (NEURONTIN) 300 MG capsule Take 300 mg by mouth 2 (two) times daily.   Yes [provider]  HYDROcodone-acetaminophen (NORCO) 10-325 MG tablet Take 1-2 tablets by mouth every 6 (six) hours as needed (pain).  12/26/16  Yes [provider]  insulin glargine (LANTUS) 100 unit/mL SOPN Inject 48 Units into the skin at bedtime.   Yes [provider]  insulin lispro (HUMALOG KWIKPEN) 100 UNIT/ML KiwkPen Inject 5-15 Units into the skin See admin instructions. Inject 5 units subcutaneously three times daily before meals adjusted per carb count - add 2 units for every 50 carbs to be consumed   Yes [provider]  labetalol (NORMODYNE) 300 MG tablet Take 300 mg by mouth 3 (three) times daily.    Yes [provider]  magnesium oxide (MAG-OX) 400 MG tablet Take 400 mg by mouth daily.   Yes [provider]  Multiple Vitamin (MULTIVITAMIN WITH MINERALS) TABS tablet Take 1 tablet  by mouth daily.   Yes [provider]  mycophenolate (MYFORTIC) 180 MG EC tablet Take 180 mg by mouth 2 (two) times daily.   Yes [provider]  omeprazole (PRILOSEC) 20 MG capsule Take 20 mg by mouth daily.   Yes [provider]  Potassium Citrate 15 MEQ (1620 MG) TBCR Take 1 tablet by mouth 3 (three) times daily.  09/21/16  Yes [provider]  predniSONE (DELTASONE) 5 MG tablet Take 5 mg by mouth daily with breakfast.   Yes [provider]  sulfamethoxazole-trimethoprim (BACTRIM,SEPTRA) 400-80 MG tablet Take 1 tablet by mouth every Monday, Wednesday, and Friday.    Yes [provider]  tacrolimus (PROGRAF) 0.5 MG capsule Take 0.5 mg by  mouth 2 (two) times daily.   Yes [provider]  vitamin B-12 (CYANOCOBALAMIN) 1000 MCG tablet Take 1,000 mcg by mouth 2 (two) times daily.    Yes [provider]  voriconazole (VFEND) 200 MG tablet Take 1 tablet (200 mg total) 2 (two) times daily by mouth. 11/26/16  Yes Dixon, Melton Krebs, NP  Blood Glucose Monitoring Suppl (ONE TOUCH ULTRA 2) w/Device KIT  09/15/16   [provider]  tamsulosin (FLOMAX) 0.4 MG CAPS capsule Take 0.4 mg by mouth daily after breakfast.    [provider]    Family History Family History  Problem Relation Age of Onset  . Hypertension Mother   . Hyperlipidemia Father   . Hypertension Father   . Heart disease Father        before age 57  . Renal Disease Father   . Diabetes Brother   . Hyperlipidemia Son     Social History Social History   Tobacco Use  . Smoking status: Former Smoker    Packs/day: 0.50    Years: 40.00    Pack years: 20.00    Types: Cigarettes    Last attempt to quit: 12/27/2012    Years since quitting: 4.0  . Smokeless tobacco: Never Used  Substance Use Topics  . Alcohol use: No    Alcohol/week: 0.0 oz  . Drug use: No     Allergies   Lisinopril; Meloxicam; and Victoza [liraglutide]   Review of  Systems Review of Systems  Constitutional: Negative for chills and fever.  HENT: Negative for congestion and facial swelling.   Eyes: Negative for discharge and visual disturbance.  Respiratory: Positive for shortness of breath.   Cardiovascular: Positive for chest pain and leg swelling. Negative for palpitations.  Gastrointestinal: Negative for abdominal pain, diarrhea and vomiting.  Musculoskeletal: Negative for arthralgias and myalgias.  Skin: Negative for color change and rash.  Neurological: Negative for tremors, syncope and headaches.  Psychiatric/Behavioral: Negative for confusion and dysphoric mood.     Physical Exam Updated Vital Signs BP (!) 150/77   Pulse 63   Temp 98.2 F (36.8 C) (Oral)   Resp 18   SpO2 96%   Physical Exam  Constitutional: He is oriented to person, place, and time. He appears well-developed and well-nourished.  Anasarca  HENT:  Head: Normocephalic and atraumatic.  Eyes: EOM are normal. Pupils are equal, round, and reactive to light.  Neck: Normal range of motion. Neck supple. No JVD present.  Cardiovascular: Normal rate and regular rhythm. Exam reveals no gallop and no friction rub.  No murmur heard. Pulmonary/Chest: No respiratory distress. He has no wheezes. He has rales (bases).  JVP up to mid neck   Abdominal: He exhibits no distension and no mass. There is tenderness (chronic and unchanged). There is no rebound and no guarding.  Musculoskeletal: Normal range of motion. He exhibits edema.  Neurological: He is alert and oriented to person, place, and time.  Skin: No rash noted. No pallor.  Psychiatric: He has a normal mood and affect. His behavior is normal.  Nursing note and vitals reviewed.    ED Treatments / Results  Labs (all labs ordered are listed, but only abnormal results are displayed) Labs Reviewed  BASIC METABOLIC PANEL - Abnormal; Notable for the following components:      Result Value   Potassium 5.6 (*)    Glucose, Bld  108 (*)    BUN 42 (*)    Creatinine, Ser 2.54 (*)  Calcium 8.7 (*)    GFR calc non Af Amer 25 (*)    GFR calc Af Amer 29 (*)    All other components within normal limits  CBC - Abnormal; Notable for the following components:   RBC 4.09 (*)    Hemoglobin 12.3 (*)    HCT 37.8 (*)    RDW 16.3 (*)    Platelets 114 (*)    All other components within normal limits  URINALYSIS, ROUTINE W REFLEX MICROSCOPIC - Abnormal; Notable for the following components:   Color, Urine STRAW (*)    All other components within normal limits  HEPATIC FUNCTION PANEL  TACROLIMUS LEVEL  TSH  I-STAT TROPONIN, ED  I-STAT TROPONIN, ED    EKG  EKG Interpretation  Date/Time:  'Sunday January 01 2017 19:41:22 EST Ventricular Rate:  67 PR Interval:    QRS Duration: 94 QT Interval:  411 QTC Calculation: 434 R Axis:   26 Text Interpretation:  Sinus rhythm Borderline low voltage, extremity leads No significant change since last tracing Confirmed by Saylor Sheckler ([id number]) on 01/01/2017 7:50:39 PM       Radiology Dg Chest 2 View  Result Date: 01/01/2017 CLINICAL DATA:  Pt reports swelling to entire body x 2 days; CP, cough, and SOB since this morning. Nonsmoker. Hx of DM and HTN. EXAM: CHEST  2 VIEW COMPARISON:  03/16/2013 FINDINGS: Cardiac silhouette is normal in size. Loop recorder projects over the left heart. No mediastinal or hilar masses. No evidence of adenopathy. There are mildly prominent bronchovascular markings diffusely. Lungs otherwise clear. No pleural effusion or pneumothorax. Skeletal structures are intact. IMPRESSION: No active cardiopulmonary disease. Electronically Signed   By: David  Ormond M.D.   On: 01/01/2017 19:32    Procedures Procedures (including critical care time)  Medications Ordered in ED Medications - No data to display   Initial Impression / Assessment and Plan / ED Course  I have reviewed the triage vital signs and the nursing notes.  Pertinent labs & imaging results  that were available during my care of the patient were reviewed by me and considered in my medical decision making (see chart for details).     66'  yo M with a chief complaint of chest pain shortness of breath.  His symptoms actually sound very typical of ACS.  His EKG here is unremarkable.  Chest x-ray and delta troponin is negative.  I discussed case with cardiology fellow who come and evaluate the patient.  He also has anasarca which I am unsure of the cause of.  His LFTs are normal.  His renal function is mildly worse than normal.  He does have a history of a renal transplant.  There is no protein in his urine.  Cards fellow agreed that symptoms are very concerning for angina.  With worsening renal function though feels that medicine and likely nephrology need to be involved as well.  Discussed with hospitalist for admission.   The patients results and plan were reviewed and discussed.   Any x-rays performed were independently reviewed by myself.   Differential diagnosis were considered with the presenting HPI.  Medications - No data to display  Vitals:   01/01/17 2100 01/01/17 2236 01/01/17 2245 01/01/17 2300  BP: (!) 169/78 (!) 143/84 (!) 153/76 (!) 150/77  Pulse: 68 65 72 63  Resp: '17 16 13 18  ' Temp:      TempSrc:      SpO2: 93% 94% 93% 96%    Final diagnoses:  Chest pain with high risk for cardiac etiology  Anasarca  History of renal transplant    Admission/ observation were discussed with the admitting physician, patient and/or family and they are comfortable with the plan.    Final Clinical Impressions(s) / ED Diagnoses   Final diagnoses:  Chest pain with high risk for cardiac etiology  Anasarca  History of renal transplant    ED Discharge Orders    None       Deno Etienne, DO 01/01/17 2335

## 2017-01-01 NOTE — H&P (Addendum)
History and Physical    Jon [name] KVQ:[id number] DOB: [birth year] DOA: 01/01/2017  Referring MD/NP/PA:Dr. Deno [name] PCP: Jon Sheriff, MD  Patient coming from: Home  Chief Complaint: Chest pain  I have personally briefly reviewed patient's old medical records in [place]   HPI: Jon [name] is a 66 y.o. male with medical history significant of s/p renal transplant in 2015 on chronic immunosuppressive therapy, HTN, HLD, DM type II, OSA on CPAP, and remote history of tobacco abuse; who presents with complaints of intermittent chest pain with exertion.  Over the last 2-3 days patient reports having intermittent pains across his chest with exertion and feels like pressure.  Pain does not necessarily radiate and symptoms can last several minutes and are usually resolved with rest.  Associated symptoms include shortness of breath,  weight gain of unknown amount, and reports of worsening generalized whole body swelling.  Patient was diagnosed with some kind of fungal infection of his left middle finger for which she was started on voriconazole on October 31 by infectious disease.  At that time his transplant specialist was notified and they decreased his Prograf to 0.5 mg twice daily and put his Flomax on hold.  The wound of his hand has been healing well and he has had no other issues from it.  On 12/13, he was seen by his nephrologist Dr. Rolan [name] who had increased his Lasix to 80 mg twice daily and voriconazole levels were noted to be 1.7.  Lastly he was started on Anoro by his pulmonologist.  However notes that this medication has helped [name].  Denies having any palpitations, loss of consciousness, fever, chills, calf pain, nausea, vomiting, or diarrhea.  ED Course: On admission into the emergency department patient was seen to be afebrile with vital signs relatively within normal limits.  Labs revealed WBC 8.8, hemoglobin 12.3, platelets 114, potassium 5.6, BUN 42,  creatinine 2.54, and troponin 0.0 x 2.  Chest x-ray showed no acute cardiopulmonary disease.  Urinalysis showed no acute abnormalities or signs of significant protein.  Prograf level is pending.  Cardiology was consulted and recommended   Review of Systems  Constitutional: Positive for malaise/fatigue. Negative for chills, fever and weight loss.  HENT: Negative for ear discharge and ear pain.        Positive for facial swelling  Eyes: Negative for pain.       Positive for eyelid swelling  Respiratory: Positive for shortness of breath. Negative for sputum production.   Cardiovascular: Positive for chest pain and leg swelling.  Gastrointestinal: Negative for abdominal pain, blood in stool, melena, nausea and vomiting.  Genitourinary: Negative for dysuria and frequency.  Musculoskeletal: Negative for falls.  Skin: Negative for itching and rash.  Neurological: Positive for weakness. Negative for dizziness, sensory change, speech change and focal weakness.  Psychiatric/Behavioral: Negative for memory loss and suicidal ideas.    Past Medical History:  Diagnosis Date  . Arthritis    back, hands   . Chronic back pain    radiculopathy and stenosis  . Chronic kidney disease    Jon [name], awaiting Transplant   . CVA (cerebral infarction) 03/2013  . Diabetes mellitus     Lantus nightly ;type 2  . GERD (gastroesophageal reflux disease)   . H/O hiatal hernia   . History of colon polyps   . Hx of cardiovascular stress test    a.  Lexiscan Myoview (05/2013):  No ischemia, EF 53%; Normal Study  . Hyperlipidemia  takes Lovastatin daily  . Hypertension    takes Amlodipine and Catapres daily    dr Jon [name], Loop Recorder - [facility]  . Nocturia   . Peripheral edema    takes Lasix daily  . Sleep apnea    cpap , study in their home, 09/2102- [employer]           . Stroke (HCC)    speech, rt arm weakness  . Urinary frequency     Past Surgical History:  Procedure Laterality Date  . AV  FISTULA PLACEMENT Left 04/03/2013   Procedure: ARTERIOVENOUS (AV) FISTULA CREATION- LEFT RADIOCEPHALIC VS BRACHIOCEPHALIC;  Surgeon: Conrad Worthington, MD;  Location: [place];  Service: Vascular;  Laterality: Left;  . AV FISTULA PLACEMENT Left 05/14/2013   Procedure: LEFT BRACHIOCEPHALIC ARTERIOVENOUS (AV) FISTULA CREATION;  Surgeon: Conrad [employer], MD;  Location: [place];  Service: Vascular;  Laterality: Left;  . BACK SURGERY  12/2012   2003- 1st back surgery & then 2014- fusion  . COLONOSCOPY    . ESOPHAGOGASTRODUODENOSCOPY    . HEMORRHOID SURGERY    . KIDNEY TRANSPLANT  2016  . left knee surgery    . LOOP RECORDER IMPLANT  03/19/13   MDT LinQ implanted by Dr Rayann Heman for cryptogenic stroke  . LOOP RECORDER IMPLANT N/A 03/19/2013   Procedure: LOOP RECORDER IMPLANT;  Surgeon: Coralyn Mark, MD;  Location: [place] CATH LAB;  Service: Cardiovascular;  Laterality: N/A;  . NEPHRECTOMY TRANSPLANTED ORGAN    . NEUROPLASTY / TRANSPOSITION MEDIAN NERVE AT CARPAL TUNNEL BILATERAL    . right leg surgery     pin in place  . right wrist surgery    . TEE WITHOUT CARDIOVERSION N/A 03/19/2013   Procedure: TRANSESOPHAGEAL ECHOCARDIOGRAM (TEE);  Surgeon: [name], MD;  Location[facility] ENDOSCOPY;  Service: Cardiovascular;  Laterality: N/A;     reports that he quit smoking about 4 years ago. His smoking use included cigarettes. He has a 20.00 pack-year smoking history. he has never used smokeless tobacco. He reports that he does not drink alcohol or use drugs.  Allergies  Allergen Reactions  . Lisinopril Other (See Comments)    Increased potassium level   . Meloxicam Nausea And Vomiting  . Victoza [Liraglutide] Diarrhea, Nausea And Vomiting and Swelling    Family History  Problem Relation Age of Onset  . Hypertension Mother   . Hyperlipidemia Father   . Hypertension Father   . Heart disease Father        before age 60  . Renal Disease Father   . Diabetes Brother   . Hyperlipidemia Son     Prior to  Admission medications   Medication Sig Start Date End Date Taking? Authorizing Provider  amLODipine (NORVASC) 10 MG tablet Take 5 mg by mouth daily after breakfast.    Yes [provider]  aspirin EC 81 MG tablet Take 81 mg by mouth at bedtime.   Yes [provider]  atorvastatin (LIPITOR) 40 MG tablet Take 40 mg by mouth at bedtime.    Yes [provider]  calcitRIOL (ROCALTROL) 0.25 MCG capsule Take 0.25 mcg by mouth See admin instructions. Take 1 capsule (0.25 mcg) by mouth five times daily - Monday thru Friday   Yes [provider]  furosemide (LASIX) 80 MG tablet Take 80 mg by mouth See admin instructions. Take 1 tablet (80 mg) by mouth twice daily - morning and lunch time 08/11/16  Yes [provider]  gabapentin (NEURONTIN) 300  MG capsule Take 300 mg by mouth 2 (two) times daily.   Yes [provider]  HYDROcodone-acetaminophen (NORCO) 10-325 MG tablet Take 1-2 tablets by mouth every 6 (six) hours as needed (pain).  12/26/16  Yes [provider]  insulin glargine (LANTUS) 100 unit/mL SOPN Inject 48 Units into the skin at bedtime.   Yes [provider]  insulin lispro (HUMALOG KWIKPEN) 100 UNIT/ML KiwkPen Inject 5-15 Units into the skin See admin instructions. Inject 5 units subcutaneously three times daily before meals adjusted per carb count - add 2 units for every 50 carbs to be consumed   Yes [provider]  labetalol (NORMODYNE) 300 MG tablet Take 300 mg by mouth 3 (three) times daily.    Yes [provider]  magnesium oxide (MAG-OX) 400 MG tablet Take 400 mg by mouth daily.   Yes [provider]  Multiple Vitamin (MULTIVITAMIN WITH MINERALS) TABS tablet Take 1 tablet by mouth daily.   Yes [provider]  mycophenolate (MYFORTIC) 180 MG EC tablet Take 180 mg by mouth 2 (two) times daily.   Yes [provider]  omeprazole (PRILOSEC) 20 MG capsule Take 20 mg by mouth daily.    Yes [provider]  Potassium Citrate 15 MEQ (1620 MG) TBCR Take 1 tablet by mouth 3 (three) times daily.  09/21/16  Yes [provider]  predniSONE (DELTASONE) 5 MG tablet Take 5 mg by mouth daily with breakfast.   Yes [provider]  sulfamethoxazole-trimethoprim (BACTRIM,SEPTRA) 400-80 MG tablet Take 1 tablet by mouth every Monday, Wednesday, and Friday.    Yes [provider]  tacrolimus (PROGRAF) 0.5 MG capsule Take 0.5 mg by mouth 2 (two) times daily.   Yes [provider]  vitamin B-12 (CYANOCOBALAMIN) 1000 MCG tablet Take 1,000 mcg by mouth 2 (two) times daily.    Yes [provider]  voriconazole (VFEND) 200 MG tablet Take 1 tablet (200 mg total) 2 (two) times daily by mouth. 11/26/16  Yes Dixon, Melton Krebs, NP  Blood Glucose Monitoring Suppl (ONE TOUCH ULTRA 2) w/Device KIT  09/15/16   [provider]  tamsulosin (FLOMAX) 0.4 MG CAPS capsule Take 0.4 mg by mouth daily after breakfast.    [provider]    Physical Exam:  Constitutional: Obese male who appears to be in no acute distress while at rest Vitals:   01/01/17 2100 01/01/17 2236 01/01/17 2245 01/01/17 2300  BP: (!) 169/78 (!) 143/84 (!) 153/76 (!) 150/77  Pulse: 68 65 72 63  Resp: '17 16 13 18  ' Temp:      TempSrc:      SpO2: 93% 94% 93% 96%   Eyes: PERRL, generalized swelling of the bilateral lids and face ENMT: Mucous membranes are moist. Posterior pharynx clear of any exudate or lesions.  Neck: normal, supple, no masses, no thyromegaly Respiratory: clear to auscultation bilaterally, no wheezing, no crackles. Normal respiratory effort. No accessory muscle use.  Cardiovascular: Regular rate and rhythm, no murmurs / rubs / gallops.  +2 pitting edema lower extremity edema. 2+ pedal pulses. No carotid bruits.  Abdomen: no tenderness, no masses palpated. No hepatosplenomegaly. Bowel sounds positive.  Musculoskeletal: no clubbing / cyanosis. No joint  deformity upper and lower extremities. Good ROM, no contractures. Normal muscle tone.  Skin: Healing wound of left middle finger. Neurologic: CN 2-12 grossly intact. Sensation intact, DTR normal. Strength 5/5 in all 4.  Psychiatric: Normal judgment and insight. Alert and oriented x 3. Normal mood.  Labs on Admission: I have personally reviewed following labs and imaging studies  CBC: Recent Labs  Lab 01/01/17 1900  WBC 8.8  HGB 12.3*  HCT 37.8*  MCV 92.4  PLT 517*   Basic Metabolic Panel: Recent Labs  Lab 01/01/17 1900  NA 137  K 5.6*  CL 106  CO2 23  GLUCOSE 108*  BUN 42*  CREATININE 2.54*  CALCIUM 8.7*   GFR: Estimated Creatinine Clearance: 38.2 mL/min (A) (by C-G formula based on SCr of 2.54 mg/dL (H)). Liver Function Tests: Recent Labs  Lab 01/01/17 2024  AST 28  ALT 31  ALKPHOS 106  BILITOT 0.5  PROT 7.0  ALBUMIN 3.9   No results for input(s): LIPASE, AMYLASE in the last 168 hours. No results for input(s): AMMONIA in the last 168 hours. Coagulation Profile: No results for input(s): INR, PROTIME in the last 168 hours. Cardiac Enzymes: No results for input(s): CKTOTAL, CKMB, CKMBINDEX, TROPONINI in the last 168 hours. BNP (last 3 results) No results for input(s): PROBNP in the last 8760 hours. HbA1C: No results for input(s): HGBA1C in the last 72 hours. CBG: No results for input(s): GLUCAP in the last 168 hours. Lipid Profile: No results for input(s): CHOL, HDL, LDLCALC, TRIG, CHOLHDL, LDLDIRECT in the last 72 hours. Thyroid Function Tests: No results for input(s): TSH, T4TOTAL, FREET4, T3FREE, THYROIDAB in the last 72 hours. Anemia Panel: No results for input(s): VITAMINB12, FOLATE, FERRITIN, TIBC, IRON, RETICCTPCT in the last 72 hours. Urine analysis:    Component Value Date/Time   COLORURINE STRAW (A) 01/01/2017 2212   APPEARANCEUR CLEAR 01/01/2017 2212   LABSPEC 1.010 01/01/2017 2212   PHURINE 7.0 01/01/2017 2212   GLUCOSEU NEGATIVE  01/01/2017 2212   HGBUR NEGATIVE 01/01/2017 2212   BILIRUBINUR NEGATIVE 01/01/2017 2212   KETONESUR NEGATIVE 01/01/2017 2212   PROTEINUR NEGATIVE 01/01/2017 2212   UROBILINOGEN 0.2 01/23/2011 0551   NITRITE NEGATIVE 01/01/2017 2212   LEUKOCYTESUR NEGATIVE 01/01/2017 2212   Sepsis Labs: No results found for this or any previous visit (from the past 240 hour(s)).   Radiological Exams on Admission: Dg Chest 2 View  Result Date: 01/01/2017 CLINICAL DATA:  Pt reports swelling to entire body x 2 days; CP, cough, and SOB since this morning. Nonsmoker. Hx of DM and HTN. EXAM: CHEST  2 VIEW COMPARISON:  03/16/2013 FINDINGS: Cardiac silhouette is normal in size. Loop recorder projects over the left heart. No mediastinal or hilar masses. No evidence of adenopathy. There are mildly prominent bronchovascular markings diffusely. Lungs otherwise clear. No pleural effusion or pneumothorax. Skeletal structures are intact. IMPRESSION: No active cardiopulmonary disease. Electronically Signed   By: Lajean Manes M.D.   On: 01/01/2017 19:32    EKG: Independently reviewed.  Sinus rhythm at 67 bpm with borderline low voltage in the extremity leads  Assessment/Plan Chest pain and dyspnea on exertion hyperkalemia: Acute.  Patient presents with complaints of chest pain with exertion relieved with rest.  EKG with no significant ST wave changes and initial troponin and delta troponin negative.  Last negative stress test performed in 2015.  Cardiology consulted in question need cath but will need assistance and clearance with nephrology given renal transplant. - Admit to a telemetry bed  - Trend cardiac enzymes - NPO for possible need of cardiac catheterization - Check echocardiogram  - Appreciate cardiology consultative services, will follow-up for further recommendations.   Generalized peripheral edema: Acute.  Patient appears to retaining fluid on physical exam.  No signs of proteinuria seen  on urinalysis.  Chest  x-ray showing no clear signs of cardiomegaly.  Question side effect of medication. - Strict I&Os and daily weights - Follow-up TSH - Add-on BNP - lasix 60 mg IV x1 dose  Hyperkalemia: Acute initial potassium 5.6 on admission.  Suspect secondary to potassium supplementation. - Held potassium supplementation - Continue to monitor  S/p renal transplant on chronic immunosuppressive therapy, chronic kidney disease stage IV: Patient followed [facility] nephrology.  At baseline patient notes that his creatinine appears to be around 2.5 and he denies any change in urine output.  - Follow-up Prograft level - Will need to restart Prograf  - Continue Calcitrol, prednisone, Bactrim, Myfortic - Consulted nephrology and talked with Dr. Justin Mend overnight, verify nephrology will see patient in a.m.  Diabetes mellitus type 2: Last hemoglobin A1c noted to be 6.5 in 09/2016. - Hypoglycemic protocols - Restart patient's - CBGs q. before meals with moderate SSI  - Decreased home Lantus given patient and more controlled environment 48units to 30 units nightly  Fungal infection of right hand: Patient currently on voriconazole last level checked on 12/13 noted to be 1.7.  Side effect profile voriconazole associated with peripheral edema, but patient has been on this medicine since 10/31. - Continue voriconazole  - Query ID in a.m to see if this may be causing his symptoms   Essential hypertension - Continue amlodipine and labetalol  Hyperlipidemia  - Continue atorvastatin   GERD  - Continue pharmacy substitution of Protonix for omeprazole  OSA on CPAP - CPAP per RT DVT prophylaxis:  heparin Code Status: Full  Family Communication Plan of care with the patient and family present at bedside  Disposition Plan: TBD  Consults called: Cardiology and  Admission status: observation  Norval Morton MD [facility] Pager [phone]   If 7PM-7AM,  please contact night-coverage [email or web address] Password [facility]  01/01/2017, 11:20 PM

## 2017-01-01 NOTE — ED Notes (Signed)
Attempted PIVx1, no success. EDP states to hold off on establishing PIV at this time.

## 2017-01-01 NOTE — ED Notes (Signed)
Patient transported to X-ray 

## 2017-01-02 ENCOUNTER — Observation Stay (HOSPITAL_BASED_OUTPATIENT_CLINIC_OR_DEPARTMENT_OTHER): Payer: Medicare Other

## 2017-01-02 DIAGNOSIS — R601 Generalized edema: Secondary | ICD-10-CM

## 2017-01-02 DIAGNOSIS — Z7982 Long term (current) use of aspirin: Secondary | ICD-10-CM | POA: Diagnosis not present

## 2017-01-02 DIAGNOSIS — I132 Hypertensive heart and chronic kidney disease with heart failure and with stage 5 chronic kidney disease, or end stage renal disease: Secondary | ICD-10-CM | POA: Diagnosis not present

## 2017-01-02 DIAGNOSIS — E1121 Type 2 diabetes mellitus with diabetic nephropathy: Secondary | ICD-10-CM | POA: Diagnosis present

## 2017-01-02 DIAGNOSIS — I5033 Acute on chronic diastolic (congestive) heart failure: Secondary | ICD-10-CM | POA: Diagnosis not present

## 2017-01-02 DIAGNOSIS — L089 Local infection of the skin and subcutaneous tissue, unspecified: Secondary | ICD-10-CM | POA: Diagnosis not present

## 2017-01-02 DIAGNOSIS — I130 Hypertensive heart and chronic kidney disease with heart failure and stage 1 through stage 4 chronic kidney disease, or unspecified chronic kidney disease: Secondary | ICD-10-CM | POA: Diagnosis present

## 2017-01-02 DIAGNOSIS — E785 Hyperlipidemia, unspecified: Secondary | ICD-10-CM | POA: Diagnosis present

## 2017-01-02 DIAGNOSIS — Z940 Kidney transplant status: Secondary | ICD-10-CM | POA: Diagnosis not present

## 2017-01-02 DIAGNOSIS — Z87891 Personal history of nicotine dependence: Secondary | ICD-10-CM | POA: Diagnosis not present

## 2017-01-02 DIAGNOSIS — R079 Chest pain, unspecified: Secondary | ICD-10-CM | POA: Diagnosis present

## 2017-01-02 DIAGNOSIS — R0609 Other forms of dyspnea: Secondary | ICD-10-CM | POA: Diagnosis not present

## 2017-01-02 DIAGNOSIS — E7800 Pure hypercholesterolemia, unspecified: Secondary | ICD-10-CM

## 2017-01-02 DIAGNOSIS — Z6838 Body mass index (BMI) 38.0-38.9, adult: Secondary | ICD-10-CM | POA: Diagnosis not present

## 2017-01-02 DIAGNOSIS — E875 Hyperkalemia: Secondary | ICD-10-CM

## 2017-01-02 DIAGNOSIS — Z794 Long term (current) use of insulin: Secondary | ICD-10-CM | POA: Diagnosis not present

## 2017-01-02 DIAGNOSIS — E1122 Type 2 diabetes mellitus with diabetic chronic kidney disease: Secondary | ICD-10-CM | POA: Diagnosis present

## 2017-01-02 DIAGNOSIS — Z79899 Other long term (current) drug therapy: Secondary | ICD-10-CM | POA: Diagnosis not present

## 2017-01-02 DIAGNOSIS — E118 Type 2 diabetes mellitus with unspecified complications: Secondary | ICD-10-CM | POA: Diagnosis not present

## 2017-01-02 DIAGNOSIS — R0789 Other chest pain: Secondary | ICD-10-CM

## 2017-01-02 DIAGNOSIS — G4733 Obstructive sleep apnea (adult) (pediatric): Secondary | ICD-10-CM | POA: Diagnosis present

## 2017-01-02 DIAGNOSIS — M898X9 Other specified disorders of bone, unspecified site: Secondary | ICD-10-CM | POA: Diagnosis present

## 2017-01-02 DIAGNOSIS — N179 Acute kidney failure, unspecified: Secondary | ICD-10-CM | POA: Diagnosis present

## 2017-01-02 DIAGNOSIS — N184 Chronic kidney disease, stage 4 (severe): Secondary | ICD-10-CM | POA: Diagnosis present

## 2017-01-02 DIAGNOSIS — I361 Nonrheumatic tricuspid (valve) insufficiency: Secondary | ICD-10-CM | POA: Diagnosis not present

## 2017-01-02 DIAGNOSIS — R609 Edema, unspecified: Secondary | ICD-10-CM | POA: Diagnosis present

## 2017-01-02 DIAGNOSIS — Z8673 Personal history of transient ischemic attack (TIA), and cerebral infarction without residual deficits: Secondary | ICD-10-CM | POA: Diagnosis not present

## 2017-01-02 DIAGNOSIS — Z888 Allergy status to other drugs, medicaments and biological substances status: Secondary | ICD-10-CM | POA: Diagnosis not present

## 2017-01-02 DIAGNOSIS — T8619 Other complication of kidney transplant: Secondary | ICD-10-CM | POA: Diagnosis present

## 2017-01-02 DIAGNOSIS — I2729 Other secondary pulmonary hypertension: Secondary | ICD-10-CM | POA: Diagnosis present

## 2017-01-02 DIAGNOSIS — B352 Tinea manuum: Secondary | ICD-10-CM | POA: Diagnosis present

## 2017-01-02 DIAGNOSIS — K219 Gastro-esophageal reflux disease without esophagitis: Secondary | ICD-10-CM | POA: Diagnosis present

## 2017-01-02 DIAGNOSIS — I5082 Biventricular heart failure: Secondary | ICD-10-CM | POA: Diagnosis not present

## 2017-01-02 LAB — ECHOCARDIOGRAM COMPLETE
Height: 70.5 in
WEIGHTICAEL: 4339.2 [oz_av]

## 2017-01-02 LAB — CBC WITH DIFFERENTIAL/PLATELET
BASOS ABS: 0 10*3/uL (ref 0.0–0.1)
BASOS PCT: 0 %
EOS ABS: 0.1 10*3/uL (ref 0.0–0.7)
EOS PCT: 1 %
HCT: 37.9 % — ABNORMAL LOW (ref 39.0–52.0)
Hemoglobin: 12.1 g/dL — ABNORMAL LOW (ref 13.0–17.0)
Lymphocytes Relative: 14 %
Lymphs Abs: 1.1 10*3/uL (ref 0.7–4.0)
MCH: 29.6 pg (ref 26.0–34.0)
MCHC: 31.9 g/dL (ref 30.0–36.0)
MCV: 92.7 fL (ref 78.0–100.0)
MONO ABS: 0.4 10*3/uL (ref 0.1–1.0)
Monocytes Relative: 5 %
Neutro Abs: 6.2 10*3/uL (ref 1.7–7.7)
Neutrophils Relative %: 80 %
PLATELETS: 119 10*3/uL — AB (ref 150–400)
RBC: 4.09 MIL/uL — ABNORMAL LOW (ref 4.22–5.81)
RDW: 16.8 % — AB (ref 11.5–15.5)
WBC: 7.8 10*3/uL (ref 4.0–10.5)

## 2017-01-02 LAB — BASIC METABOLIC PANEL
Anion gap: 9 (ref 5–15)
BUN: 42 mg/dL — AB (ref 6–20)
CALCIUM: 8.7 mg/dL — AB (ref 8.9–10.3)
CO2: 25 mmol/L (ref 22–32)
Chloride: 104 mmol/L (ref 101–111)
Creatinine, Ser: 2.44 mg/dL — ABNORMAL HIGH (ref 0.61–1.24)
GFR calc Af Amer: 30 mL/min — ABNORMAL LOW (ref >60)
GFR, EST NON AFRICAN AMERICAN: 26 mL/min — AB (ref >60)
GLUCOSE: 149 mg/dL — AB (ref 65–99)
Potassium: 4.4 mmol/L (ref 3.5–5.1)
SODIUM: 138 mmol/L (ref 135–145)

## 2017-01-02 LAB — GLUCOSE, CAPILLARY
GLUCOSE-CAPILLARY: 112 mg/dL — AB (ref 65–99)
GLUCOSE-CAPILLARY: 92 mg/dL (ref 65–99)
Glucose-Capillary: 101 mg/dL — ABNORMAL HIGH (ref 65–99)
Glucose-Capillary: 174 mg/dL — ABNORMAL HIGH (ref 65–99)
Glucose-Capillary: 133 mg/dL — ABNORMAL HIGH (ref 65–99)

## 2017-01-02 LAB — LIPID PANEL
Cholesterol: 137 mg/dL (ref 0–200)
HDL: 38 mg/dL — AB (ref >40)
LDL CALC: 83 mg/dL (ref 0–99)
TRIGLYCERIDES: 81 mg/dL (ref <150)
Total CHOL/HDL Ratio: 3.6 RATIO
VLDL: 16 mg/dL (ref 0–40)

## 2017-01-02 LAB — BRAIN NATRIURETIC PEPTIDE: B NATRIURETIC PEPTIDE 5: 201.8 pg/mL — AB (ref 0.0–100.0)

## 2017-01-02 LAB — TSH: TSH: 1.543 u[IU]/mL (ref 0.350–4.500)

## 2017-01-02 LAB — TROPONIN I: Troponin I: <0.03 ng/mL (ref <0.03)

## 2017-01-02 MED ORDER — VORICONAZOLE 200 MG PO TABS
200.0000 mg | ORAL_TABLET | Freq: Two times a day (BID) | ORAL | Status: DC
  Administered 2017-01-02 – 2017-01-04 (×5): 200 mg via ORAL
  Filled 2017-01-02 (×5): qty 1

## 2017-01-02 MED ORDER — AMLODIPINE BESYLATE 5 MG PO TABS
5.0000 mg | ORAL_TABLET | Freq: Every day | ORAL | Status: DC
  Administered 2017-01-02: 5 mg via ORAL
  Filled 2017-01-02: qty 1

## 2017-01-02 MED ORDER — SULFAMETHOXAZOLE-TRIMETHOPRIM 400-80 MG PO TABS
1.0000 | ORAL_TABLET | ORAL | Status: DC
  Administered 2017-01-02: 1 via ORAL
  Filled 2017-01-02: qty 1

## 2017-01-02 MED ORDER — FUROSEMIDE 10 MG/ML IJ SOLN
60.0000 mg | INTRAMUSCULAR | Status: AC
  Administered 2017-01-02: 60 mg via INTRAVENOUS
  Filled 2017-01-02: qty 6

## 2017-01-02 MED ORDER — PANTOPRAZOLE SODIUM 40 MG PO TBEC
40.0000 mg | DELAYED_RELEASE_TABLET | Freq: Every day | ORAL | Status: DC
  Administered 2017-01-02 – 2017-01-04 (×3): 40 mg via ORAL
  Filled 2017-01-02 (×3): qty 1

## 2017-01-02 MED ORDER — INSULIN ASPART 100 UNIT/ML ~~LOC~~ SOLN
0.0000 [IU] | Freq: Three times a day (TID) | SUBCUTANEOUS | Status: DC
  Administered 2017-01-02: 3 [IU] via SUBCUTANEOUS
  Administered 2017-01-03: 5 [IU] via SUBCUTANEOUS
  Administered 2017-01-03 – 2017-01-04 (×2): 3 [IU] via SUBCUTANEOUS

## 2017-01-02 MED ORDER — ASPIRIN EC 81 MG PO TBEC
81.0000 mg | DELAYED_RELEASE_TABLET | Freq: Every day | ORAL | Status: DC
  Administered 2017-01-02 – 2017-01-03 (×3): 81 mg via ORAL
  Filled 2017-01-02 (×3): qty 1

## 2017-01-02 MED ORDER — INSULIN GLARGINE 100 UNIT/ML ~~LOC~~ SOLN
38.0000 [IU] | Freq: Every day | SUBCUTANEOUS | Status: DC
  Administered 2017-01-02 – 2017-01-03 (×2): 38 [IU] via SUBCUTANEOUS
  Filled 2017-01-02 (×2): qty 0.38

## 2017-01-02 MED ORDER — HYDROCODONE-ACETAMINOPHEN 10-325 MG PO TABS
1.0000 | ORAL_TABLET | Freq: Four times a day (QID) | ORAL | Status: DC | PRN
  Administered 2017-01-03: 1 via ORAL
  Filled 2017-01-02: qty 1

## 2017-01-02 MED ORDER — TACROLIMUS 0.5 MG PO CAPS
0.5000 mg | ORAL_CAPSULE | Freq: Two times a day (BID) | ORAL | Status: DC
  Administered 2017-01-02 – 2017-01-04 (×5): 0.5 mg via ORAL
  Filled 2017-01-02 (×6): qty 1

## 2017-01-02 MED ORDER — ACETAMINOPHEN 325 MG PO TABS: 650.0000 mg | ORAL_TABLET | ORAL | Status: DC | PRN

## 2017-01-02 MED ORDER — ONDANSETRON HCL 4 MG/2ML IJ SOLN: 4.0000 mg | Freq: Four times a day (QID) | INTRAMUSCULAR | Status: DC | PRN

## 2017-01-02 MED ORDER — GABAPENTIN 300 MG PO CAPS
300.0000 mg | ORAL_CAPSULE | Freq: Two times a day (BID) | ORAL | Status: DC
  Administered 2017-01-02 – 2017-01-04 (×6): 300 mg via ORAL
  Filled 2017-01-02 (×6): qty 1

## 2017-01-02 MED ORDER — AMLODIPINE BESYLATE 5 MG PO TABS: 5.0000 mg | ORAL_TABLET | Freq: Every day | ORAL | Status: DC

## 2017-01-02 MED ORDER — GI COCKTAIL ~~LOC~~: 30.0000 mL | Freq: Four times a day (QID) | ORAL | Status: DC | PRN

## 2017-01-02 MED ORDER — MORPHINE SULFATE (PF) 4 MG/ML IV SOLN: 2.0000 mg | INTRAVENOUS | Status: DC | PRN

## 2017-01-02 MED ORDER — HEPARIN SODIUM (PORCINE) 5000 UNIT/ML IJ SOLN
5000.0000 [IU] | Freq: Three times a day (TID) | INTRAMUSCULAR | Status: DC
  Administered 2017-01-02 – 2017-01-04 (×6): 5000 [IU] via SUBCUTANEOUS
  Filled 2017-01-02 (×6): qty 1

## 2017-01-02 MED ORDER — FUROSEMIDE 80 MG PO TABS
160.0000 mg | ORAL_TABLET | Freq: Three times a day (TID) | ORAL | Status: DC
  Administered 2017-01-02 – 2017-01-04 (×6): 160 mg via ORAL
  Filled 2017-01-02 (×6): qty 2

## 2017-01-02 MED ORDER — CALCITRIOL 0.25 MCG PO CAPS
0.2500 ug | ORAL_CAPSULE | ORAL | Status: DC
  Administered 2017-01-02 – 2017-01-04 (×3): 0.25 ug via ORAL
  Filled 2017-01-02 (×3): qty 1

## 2017-01-02 MED ORDER — MYCOPHENOLATE SODIUM 180 MG PO TBEC
180.0000 mg | DELAYED_RELEASE_TABLET | Freq: Two times a day (BID) | ORAL | Status: DC
  Administered 2017-01-02 – 2017-01-04 (×6): 180 mg via ORAL
  Filled 2017-01-02 (×6): qty 1

## 2017-01-02 MED ORDER — LABETALOL HCL 200 MG PO TABS
300.0000 mg | ORAL_TABLET | Freq: Three times a day (TID) | ORAL | Status: DC
  Administered 2017-01-02 – 2017-01-03 (×5): 300 mg via ORAL
  Filled 2017-01-02 (×5): qty 1

## 2017-01-02 MED ORDER — ATORVASTATIN CALCIUM 40 MG PO TABS
40.0000 mg | ORAL_TABLET | Freq: Every day | ORAL | Status: DC
  Administered 2017-01-02 – 2017-01-03 (×2): 40 mg via ORAL
  Filled 2017-01-02 (×3): qty 1

## 2017-01-02 MED ORDER — PREDNISONE 5 MG PO TABS
5.0000 mg | ORAL_TABLET | Freq: Every day | ORAL | Status: DC
  Administered 2017-01-02 – 2017-01-04 (×3): 5 mg via ORAL
  Filled 2017-01-02 (×3): qty 1

## 2017-01-02 NOTE — Progress Notes (Signed)
  Echocardiogram 2D Echocardiogram has been performed.  Jannett Celestine 01/02/2017, 2:44 PM

## 2017-01-02 NOTE — Progress Notes (Signed)
PROGRESS NOTE    Jon [name]  XNT:[id number] DOB: 1950/12/05 DOA: 01/01/2017 PCP: [name], MD   Brief Narrative: Jon [name] is a 66 y.o. male with medical history significant of s/p renal transplant in 2015 on chronic immunosuppressive therapy, HTN, HLD, DM type II, OSA on CPAP. He presented with chest tightness/pain and found to have anasarca. Story is concerning for typical chest pain. Anasarca has responded somewhat to diuresis. Cardiology and nephrology on board.   Assessment & Plan:   Principal Problem:   Chest pain Active Problems:   Hyperkalemia   Diabetes mellitus (HCC)   History of kidney transplant   HTN (hypertension)   HLD (hyperlipidemia)   OSA (obstructive sleep apnea)   Dyspnea on exertion   Finger infection, fungal   Peripheral edema   Anasarca There was some improvement with initial diuresis overnight -nephrology recommendations for continued diuresis  Chest pain Story sounds very typical. Cardiology consulted but hesitant to perform intervention secondary to kidney failure. Tightness resolved. -Cardiology recommendations -Echocardiogram pending  Acute kidney injury on CKD IV Baseline creatinine of 2.2-2.5.  Likely secondary to fluid overload.  History of kidney transplant Secondary to kidney failure. Patient is on Prograf. -nephrology recommendations -Continue Prograf  Fungal infection of left hand Patient follows with ID as an outpatient. He is on Variconazole treatment. Discussed with Dr. Linus Salmons, [place]. Unlikely causing anasarca -Continue Variconazole   DVT prophylaxis: Heparin subq Code Status: Full code Family Communication: Wife at bedside Disposition Plan: Pending cardiac workup and improvement of anasarca   Consultants:   Cardiology  Nephrology  Procedures:   Echocardiogram pending  Antimicrobials:  Variconazole    Subjective: Swelling improved. No chest pain today.  Objective: Vitals:   01/02/17  0100 01/02/17 0135 01/02/17 0355 01/02/17 0933  BP: 138/66 (!) 154/62 (!) 142/74 136/75  Pulse: 67 65 69   Resp: 16     Temp:  97.6 F (36.4 C) 98 F (36.7 C)   TempSrc:  Oral Oral   SpO2: 93% 93% 94%   Weight:  123 kg (271 lb 3.2 oz)    Height:  5' 10.5" (1.791 m)      Intake/Output Summary (Last 24 hours) at 01/02/2017 0939 Last data filed at 01/02/2017 0803 Gross per 24 hour  Intake 60 ml  Output 950 ml  Net -890 ml   Filed Weights   01/02/17 0135  Weight: 123 kg (271 lb 3.2 oz)    Examination:  General exam: Appears calm and comfortable Respiratory system: Clear to auscultation. Respiratory effort normal. Cardiovascular system: S1 & S2 heard, RRR. 1/6 systolic murmur. Gastrointestinal system: Abdomen is nondistended, soft and nontender. No organomegaly or masses felt. Normal bowel sounds heard. Central nervous system: Alert and oriented. No focal neurological deficits. Extremities: 2+ edema in LE and trace UE. No calf tenderness Skin: No cyanosis. No rashes Psychiatry: Judgement and insight appear normal. Mood & affect appropriate.     Data Reviewed: I have personally reviewed following labs and imaging studies  CBC: Recent Labs  Lab 01/01/17 1900 01/02/17 0030  WBC 8.8 7.8  NEUTROABS  --  6.2  HGB 12.3* 12.1*  HCT 37.8* 37.9*  MCV 92.4 92.7  PLT 114* 967*   Basic Metabolic Panel: Recent Labs  Lab 01/01/17 1900 01/02/17 0030  NA 137 138  K 5.6* 4.4  CL 106 104  CO2 23 25  GLUCOSE 108* 149*  BUN 42* 42*  CREATININE 2.54* 2.44*  CALCIUM 8.7* 8.7*  GFR: Estimated Creatinine Clearance: 40 mL/min (A) (by C-G formula based on SCr of 2.44 mg/dL (H)). Liver Function Tests: Recent Labs  Lab 01/01/17 2024  AST 28  ALT 31  ALKPHOS 106  BILITOT 0.5  PROT 7.0  ALBUMIN 3.9   No results for input(s): LIPASE, AMYLASE in the last 168 hours. No results for input(s): AMMONIA in the last 168 hours. Coagulation Profile: No results for input(s): INR,  PROTIME in the last 168 hours. Cardiac Enzymes: Recent Labs  Lab 01/02/17 0029 01/02/17 0343  TROPONINI <0.03 <0.03   BNP (last 3 results) No results for input(s): PROBNP in the last 8760 hours. HbA1C: No results for input(s): HGBA1C in the last 72 hours. CBG: Recent Labs  Lab 01/02/17 0142 01/02/17 0839  GLUCAP 112* 92   Lipid Profile: Recent Labs    01/02/17 0343  CHOL 137  HDL 38*  LDLCALC 83  TRIG 81  CHOLHDL 3.6   Thyroid Function Tests: Recent Labs    01/02/17 0029  TSH 1.543   Anemia Panel: No results for input(s): VITAMINB12, FOLATE, FERRITIN, TIBC, IRON, RETICCTPCT in the last 72 hours. Sepsis Labs: No results for input(s): PROCALCITON, LATICACIDVEN in the last 168 hours.  No results found for this or any previous visit (from the past 240 hour(s)).       Radiology Studies: Dg Chest 2 View  Result Date: 01/01/2017 CLINICAL DATA:  Pt reports swelling to entire body x 2 days; CP, cough, and SOB since this morning. Nonsmoker. Hx of DM and HTN. EXAM: CHEST  2 VIEW COMPARISON:  03/16/2013 FINDINGS: Cardiac silhouette is normal in size. Loop recorder projects over the left heart. No mediastinal or hilar masses. No evidence of adenopathy. There are mildly prominent bronchovascular markings diffusely. Lungs otherwise clear. No pleural effusion or pneumothorax. Skeletal structures are intact. IMPRESSION: No active cardiopulmonary disease. Electronically Signed   By: Lajean Manes M.D.   On: 01/01/2017 19:32        Scheduled Meds: . amLODipine  5 mg Oral QPC breakfast  . aspirin EC  81 mg Oral QHS  . atorvastatin  40 mg Oral QHS  . calcitRIOL  0.25 mcg Oral Once per day on Mon Tue Wed Thu Fri  . gabapentin  300 mg Oral BID  . heparin  5,000 Units Subcutaneous Q8H  . insulin aspart  0-15 Units Subcutaneous TID WC  . insulin glargine  38 Units Subcutaneous QHS  . labetalol  300 mg Oral TID  . mycophenolate  180 mg Oral BID  . pantoprazole  40 mg Oral  Daily  . predniSONE  5 mg Oral Q breakfast  . sulfamethoxazole-trimethoprim  1 tablet Oral Q M,W,F  . voriconazole  200 mg Oral Q12H   Continuous Infusions:   LOS: 0 days     Cordelia Poche, MD [facility] 01/02/2017, 9:39 AM Pager: [phone]  If 7PM-7AM, please contact night-coverage [email or web address] Password [facility] 01/02/2017, 9:39 AM

## 2017-01-02 NOTE — Consult Note (Signed)
Reason for Consult:Renal transplant , anasarca Referring Physician: Dr. Cleophas Dunker is an 66 y.o. male.  HPI: 66 yr male with ESRD, and [place] 2015, hs DJD, Obesity, OSA, Type 2 DM presents now with progressive edema over 4-6 wk.  Saw Dr.  Moshe Cipro on 12/18 with edema and was not taking his Lasix reg.  Has been worse so now gets chest tightness with exertion, esp stairs. Also gets transient visual disturbances with SOB. SOB <20 ft. The tightness is a squeezing.  Baseline Cr 2.2-2.5.  2.4 -2.5 here. No hematuria,voiding sx, fevers, N, V, D, cough.  Was put on Voriconazole 6 wk ago for finger space infection.   Prograf level 6.6 on 12/12.   Constitutional: more fatigued Eyes: as above Ears, nose, mouth, throat, and face: negative Respiratory: negative Cardiovascular: as above Gastrointestinal: negative Genitourinary:negative Integument/breast: negative Musculoskeletal:back pain Endocrine: BS<170 Allergic/Immunologic: negative   Primary Nephrologist [place]. .  Access LUA AVF.  Past Medical History:  Diagnosis Date  . Arthritis    back, hands   . Chronic back pain    radiculopathy and stenosis  . Chronic kidney disease    [name], awaiting Transplant   . CVA (cerebral infarction) 03/2013  . Diabetes mellitus     Lantus nightly ;type 2  . GERD (gastroesophageal reflux disease)   . H/O hiatal hernia   . History of colon polyps   . Hx of cardiovascular stress test    a.  Lexiscan Myoview (05/2013):  No ischemia, EF 53%; Normal Study  . Hyperlipidemia    takes Lovastatin daily  . Hypertension    takes Amlodipine and Catapres daily    dr Forrestine Him, Loop Recorder - [facility]  . Nocturia   . Peripheral edema    takes Lasix daily  . Sleep apnea    cpap , study in their home, 09/2102- [facility]           . Stroke (HCC)    speech, rt arm weakness  . Urinary frequency     Past Surgical History:  Procedure Laterality Date  . AV FISTULA PLACEMENT Left  04/03/2013   Procedure: ARTERIOVENOUS (AV) FISTULA CREATION- LEFT RADIOCEPHALIC VS BRACHIOCEPHALIC;  Surgeon: Conrad Idylwood, MD;  Location: [facility];  Service: Vascular;  Laterality: Left;  . AV FISTULA PLACEMENT Left 05/14/2013   Procedure: LEFT BRACHIOCEPHALIC ARTERIOVENOUS (AV) FISTULA CREATION;  Surgeon: Conrad Yoder, MD;  Location: [facility] [facility];  Service: Vascular;  Laterality: Left;  . BACK SURGERY  12/2012   2003- 1st back surgery & then 2014- fusion  . COLONOSCOPY    . ESOPHAGOGASTRODUODENOSCOPY    . HEMORRHOID SURGERY    . KIDNEY TRANSPLANT  2016  . left knee surgery    . LOOP RECORDER IMPLANT  03/19/13   MDT LinQ implanted by Dr Rayann Heman for cryptogenic stroke  . LOOP RECORDER IMPLANT N/A 03/19/2013   Procedure: LOOP RECORDER IMPLANT;  Surgeon: Coralyn Mark, MD;  Location: [facility] CATH LAB;  Service: Cardiovascular;  Laterality: N/A;  . NEPHRECTOMY TRANSPLANTED ORGAN    . NEUROPLASTY / TRANSPOSITION MEDIAN NERVE AT CARPAL TUNNEL BILATERAL    . right leg surgery     pin in place  . right wrist surgery    . TEE WITHOUT CARDIOVERSION N/A 03/19/2013   Procedure: TRANSESOPHAGEAL ECHOCARDIOGRAM (TEE);  Surgeon: [name], MD;  Location: [facility] ENDOSCOPY;  Service: Cardiovascular;  Laterality: N/A;    Family History  Problem Relation Age of Onset  .  Hypertension Mother   . Hyperlipidemia Father   . Hypertension Father   . Heart disease Father        before age 12  . Renal Disease Father   . Diabetes Brother   . Hyperlipidemia Son     Social History:  reports that he quit smoking about 4 years ago. His smoking use included cigarettes. He has a 20.00 pack-year smoking history. he has never used smokeless tobacco. He reports that he does not drink alcohol or use drugs.  Allergies:  Allergies  Allergen Reactions  . Lisinopril Other (See Comments)    Increased potassium level   . Meloxicam Nausea And Vomiting  . Victoza [Liraglutide] Diarrhea, Nausea And Vomiting and Swelling     Medications:  I have reviewed the patient's current medications. Prior to Admission:  Medications Prior to Admission  Medication Sig Dispense Refill Last Dose  . amLODipine (NORVASC) 10 MG tablet Take 5 mg by mouth daily after breakfast.    01/01/2017 at am  . aspirin EC 81 MG tablet Take 81 mg by mouth at bedtime.   12/31/2016 at pm  . atorvastatin (LIPITOR) 40 MG tablet Take 40 mg by mouth at bedtime.    12/31/2016 at pm  . calcitRIOL (ROCALTROL) 0.25 MCG capsule Take 0.25 mcg by mouth See admin instructions. Take 1 capsule (0.25 mcg) by mouth five times daily - Monday thru Friday   12/30/2016 at am  . furosemide (LASIX) 80 MG tablet Take 80 mg by mouth See admin instructions. Take 1 tablet (80 mg) by mouth twice daily - morning and lunch time  6 01/01/2017 at noon  . gabapentin (NEURONTIN) 300 MG capsule Take 300 mg by mouth 2 (two) times daily.   01/01/2017 at am  . HYDROcodone-acetaminophen (NORCO) 10-325 MG tablet Take 1-2 tablets by mouth every 6 (six) hours as needed (pain).    01/01/2017 at am  . insulin glargine (LANTUS) 100 unit/mL SOPN Inject 48 Units into the skin at bedtime.   12/31/2016 at pm  . insulin lispro (HUMALOG KWIKPEN) 100 UNIT/ML KiwkPen Inject 5-15 Units into the skin See admin instructions. Inject 5 units subcutaneously three times daily before meals adjusted per carb count - add 2 units for every 50 carbs to be consumed   01/01/2017 at lunch  . labetalol (NORMODYNE) 300 MG tablet Take 300 mg by mouth 3 (three) times daily.    01/01/2017 at 1200  . magnesium oxide (MAG-OX) 400 MG tablet Take 400 mg by mouth daily.   01/01/2017 at am  . Multiple Vitamin (MULTIVITAMIN WITH MINERALS) TABS tablet Take 1 tablet by mouth daily.   01/01/2017 at am  . mycophenolate (MYFORTIC) 180 MG EC tablet Take 180 mg by mouth 2 (two) times daily.   01/01/2017 at am  . omeprazole (PRILOSEC) 20 MG capsule Take 20 mg by mouth daily.   01/01/2017 at am  . Potassium Citrate 15 MEQ (1620 MG)  TBCR Take 1 tablet by mouth 3 (three) times daily.    01/01/2017 at noon  . predniSONE (DELTASONE) 5 MG tablet Take 5 mg by mouth daily with breakfast.   01/01/2017 at am  . sulfamethoxazole-trimethoprim (BACTRIM,SEPTRA) 400-80 MG tablet Take 1 tablet by mouth every Monday, Wednesday, and Friday.    12/30/2016  . tacrolimus (PROGRAF) 0.5 MG capsule Take 0.5 mg by mouth 2 (two) times daily.   01/01/2017 at am  . vitamin B-12 (CYANOCOBALAMIN) 1000 MCG tablet Take 1,000 mcg by mouth 2 (two) times daily.  01/01/2017 at am  . voriconazole (VFEND) 200 MG tablet Take 1 tablet (200 mg total) 2 (two) times daily by mouth. 60 tablet 2 01/01/2017 at am  . Blood Glucose Monitoring Suppl (ONE TOUCH ULTRA 2) w/Device KIT    Taking  . tamsulosin (FLOMAX) 0.4 MG CAPS capsule Take 0.4 mg by mouth daily after breakfast.   11/08/2016    Calcitriol .33mg po qd Results for orders placed or performed during the hospital encounter of 01/01/17 (from the past 48 hour(s))  Basic metabolic panel     Status: Abnormal   Collection Time: 01/01/17  7:00 PM  Result Value Ref Range   Sodium 137 135 - 145 mmol/L   Potassium 5.6 (H) 3.5 - 5.1 mmol/L   Chloride 106 101 - 111 mmol/L   CO2 23 22 - 32 mmol/L   Glucose, Bld 108 (H) 65 - 99 mg/dL   BUN 42 (H) 6 - 20 mg/dL   Creatinine, Ser 2.54 (H) 0.61 - 1.24 mg/dL   Calcium 8.7 (L) 8.9 - 10.3 mg/dL   GFR calc non Af Amer 25 (L) >60 mL/min   GFR calc Af Amer 29 (L) >60 mL/min    Comment: (NOTE) The eGFR has been calculated using the CKD EPI equation. This calculation has not been validated in all clinical situations. eGFR's persistently <60 mL/min signify possible Chronic Kidney Disease.    Anion gap 8 5 - 15  CBC     Status: Abnormal   Collection Time: 01/01/17  7:00 PM  Result Value Ref Range   WBC 8.8 4.0 - 10.5 K/uL   RBC 4.09 (L) 4.22 - 5.81 MIL/uL   Hemoglobin 12.3 (L) 13.0 - 17.0 g/dL   HCT 37.8 (L) 39.0 - 52.0 %   MCV 92.4 78.0 - 100.0 fL   MCH 30.1 26.0 -  34.0 pg   MCHC 32.5 30.0 - 36.0 g/dL   RDW 16.3 (H) 11.5 - 15.5 %   Platelets 114 (L) 150 - 400 K/uL    Comment: REPEATED TO VERIFY SPECIMEN CHECKED FOR CLOTS PLATELET COUNT CONFIRMED BY SMEAR   I-stat troponin, ED     Status: None   Collection Time: 01/01/17  7:00 PM  Result Value Ref Range   Troponin i, poc 0.00 0.00 - 0.08 ng/mL   Comment 3            Comment: Due to the release kinetics of cTnI, a negative result within the first hours of the onset of symptoms does not rule out myocardial infarction with certainty. If myocardial infarction is still suspected, repeat the test at appropriate intervals.   Hepatic function panel     Status: None   Collection Time: 01/01/17  8:24 PM  Result Value Ref Range   Total Protein 7.0 6.5 - 8.1 g/dL   Albumin 3.9 3.5 - 5.0 g/dL   AST 28 15 - 41 U/L   ALT 31 17 - 63 U/L   Alkaline Phosphatase 106 38 - 126 U/L   Total Bilirubin 0.5 0.3 - 1.2 mg/dL   Bilirubin, Direct 0.1 0.1 - 0.5 mg/dL   Indirect Bilirubin 0.4 0.3 - 0.9 mg/dL  I-stat troponin, ED     Status: None   Collection Time: 01/01/17  9:45 PM  Result Value Ref Range   Troponin i, poc 0.00 0.00 - 0.08 ng/mL   Comment 3            Comment: Due to the release kinetics of cTnI, a negative  result within the first hours of the onset of symptoms does not rule out myocardial infarction with certainty. If myocardial infarction is still suspected, repeat the test at appropriate intervals.   Urinalysis, Routine w reflex microscopic     Status: Abnormal   Collection Time: 01/01/17 10:12 PM  Result Value Ref Range   Color, Urine STRAW (A) YELLOW   APPearance CLEAR CLEAR   Specific Gravity, Urine 1.010 1.005 - 1.030   pH 7.0 5.0 - 8.0   Glucose, UA NEGATIVE NEGATIVE mg/dL   Hgb urine dipstick NEGATIVE NEGATIVE   Bilirubin Urine NEGATIVE NEGATIVE   Ketones, ur NEGATIVE NEGATIVE mg/dL   Protein, ur NEGATIVE NEGATIVE mg/dL   Nitrite NEGATIVE NEGATIVE   Leukocytes, UA NEGATIVE  NEGATIVE  TSH     Status: None   Collection Time: 01/02/17 12:29 AM  Result Value Ref Range   TSH 1.543 0.350 - 4.500 uIU/mL    Comment: Performed by a 3rd Generation assay with a functional sensitivity of <=0.01 uIU/mL.  Troponin I-serum (0, 3, 6 hours)     Status: None   Collection Time: 01/02/17 12:29 AM  Result Value Ref Range   Troponin I <0.03 <0.03 ng/mL  Basic metabolic panel     Status: Abnormal   Collection Time: 01/02/17 12:30 AM  Result Value Ref Range   Sodium 138 135 - 145 mmol/L   Potassium 4.4 3.5 - 5.1 mmol/L   Chloride 104 101 - 111 mmol/L   CO2 25 22 - 32 mmol/L   Glucose, Bld 149 (H) 65 - 99 mg/dL   BUN 42 (H) 6 - 20 mg/dL   Creatinine, Ser 2.44 (H) 0.61 - 1.24 mg/dL   Calcium 8.7 (L) 8.9 - 10.3 mg/dL   GFR calc non Af Amer 26 (L) >60 mL/min   GFR calc Af Amer 30 (L) >60 mL/min    Comment: (NOTE) The eGFR has been calculated using the CKD EPI equation. This calculation has not been validated in all clinical situations. eGFR's persistently <60 mL/min signify possible Chronic Kidney Disease.    Anion gap 9 5 - 15  CBC with Differential/Platelet     Status: Abnormal   Collection Time: 01/02/17 12:30 AM  Result Value Ref Range   WBC 7.8 4.0 - 10.5 K/uL   RBC 4.09 (L) 4.22 - 5.81 MIL/uL   Hemoglobin 12.1 (L) 13.0 - 17.0 g/dL   HCT 37.9 (L) 39.0 - 52.0 %   MCV 92.7 78.0 - 100.0 fL   MCH 29.6 26.0 - 34.0 pg   MCHC 31.9 30.0 - 36.0 g/dL   RDW 16.8 (H) 11.5 - 15.5 %   Platelets 119 (L) 150 - 400 K/uL    Comment: CONSISTENT WITH PREVIOUS RESULT   Neutrophils Relative % 80 %   Neutro Abs 6.2 1.7 - 7.7 K/uL   Lymphocytes Relative 14 %   Lymphs Abs 1.1 0.7 - 4.0 K/uL   Monocytes Relative 5 %   Monocytes Absolute 0.4 0.1 - 1.0 K/uL   Eosinophils Relative 1 %   Eosinophils Absolute 0.1 0.0 - 0.7 K/uL   Basophils Relative 0 %   Basophils Absolute 0.0 0.0 - 0.1 K/uL  Glucose, capillary     Status: Abnormal   Collection Time: 01/02/17  1:42 AM  Result Value  Ref Range   Glucose-Capillary 112 (H) 65 - 99 mg/dL  Lipid panel     Status: Abnormal   Collection Time: 01/02/17  3:43 AM  Result Value Ref Range  Cholesterol 137 0 - 200 mg/dL   Triglycerides 81 <150 mg/dL   HDL 38 (L) >40 mg/dL   Total CHOL/HDL Ratio 3.6 RATIO   VLDL 16 0 - 40 mg/dL   LDL Cholesterol 83 0 - 99 mg/dL    Comment:        Total Cholesterol/HDL:CHD Risk Coronary Heart Disease Risk Table                     Men   Women  1/2 Average Risk   3.4   3.3  Average Risk       5.0   4.4  2 X Average Risk   9.6   7.1  3 X Average Risk  23.4   11.0        Use the calculated Patient Ratio above and the CHD Risk Table to determine the patient's CHD Risk.        ATP III CLASSIFICATION (LDL):  <100     mg/dL   Optimal  100-129  mg/dL   Near or Above                    Optimal  130-159  mg/dL   Borderline  160-189  mg/dL   High  >190     mg/dL   Very High   Troponin I     Status: None   Collection Time: 01/02/17  3:43 AM  Result Value Ref Range   Troponin I <0.03 <0.03 ng/mL  Brain natriuretic peptide     Status: Abnormal   Collection Time: 01/02/17  3:43 AM  Result Value Ref Range   B Natriuretic Peptide 201.8 (H) 0.0 - 100.0 pg/mL  Glucose, capillary     Status: None   Collection Time: 01/02/17  8:39 AM  Result Value Ref Range   Glucose-Capillary 92 65 - 99 mg/dL  Glucose, capillary     Status: Abnormal   Collection Time: 01/02/17 11:18 AM  Result Value Ref Range   Glucose-Capillary 101 (H) 65 - 99 mg/dL    Dg Chest 2 View  Result Date: 01/01/2017 CLINICAL DATA:  Pt reports swelling to entire body x 2 days; CP, cough, and SOB since this morning. Nonsmoker. Hx of DM and HTN. EXAM: CHEST  2 VIEW COMPARISON:  03/16/2013 FINDINGS: Cardiac silhouette is normal in size. Loop recorder projects over the left heart. No mediastinal or hilar masses. No evidence of adenopathy. There are mildly prominent bronchovascular markings diffusely. Lungs otherwise clear. No pleural  effusion or pneumothorax. Skeletal structures are intact. IMPRESSION: No active cardiopulmonary disease. Electronically Signed   By: Lajean Manes M.D.   On: 01/01/2017 19:32    ROS Blood pressure 136/75, pulse 69, temperature 98 F (36.7 C), temperature source Oral, resp. rate 16, height 5' 10.5" (1.791 m), weight 123 kg (271 lb 3.2 oz), SpO2 94 %. Physical Exam Physical Examination: General appearance - obes, pale, NAD Mental status - alert, oriented to person, place, and time Eyes - pupils equal and reactive, extraocular eye movements intact, funduscopic exam normal, discs flat and sharp Nose - normal and patent, no erythema, discharge or polyps Mouth - edentulous Neck - adenopathy noted PCL Lymphatics - posterior cervical nodes Chest - rales noted bibasilar, decreased air entry noted bilat Heart - S1 and S2 normal, S4 present, systolic murmur CW8/8 at apex, decrease HS Abdomen - obese, pos bs, liver down 7 cm, TX RLQ Extremities - pedal edema 3-4 + Skin - pale, bruises,  Assessment/Plan: 1 Renal Transplant  Vol xs , CKD4  , Na and water retention . Neg UA and at baseline Cr, suspect is higher when vol controlled.  Needs diuesis. ? Role of Voriconazole.  Prograf has been ok 2 Chest tightness needs coronary eval in future 3 Hypertension: lower vol, lower meds 4. Obesity 5. Metabolic Bone Disease: check PTH 6 OSA ? R heart failure 7 DM P high dose Lasix, check PTh, check Prograf  Jeneen Rinks Eeshan Verbrugge 01/02/2017, 12:35 PM

## 2017-01-02 NOTE — Progress Notes (Addendum)
Progress Note  Patient Name: Jon [name] Date of Encounter: 01/02/2017  Primary Cardiologist: Dr. Rayann Heman.   Subjective   Mild shortness of breath.  Still has swelling.  No current chest pain.  Recent chest pain episode.  Inpatient Medications    Scheduled Meds: . amLODipine  5 mg Oral QPC breakfast  . aspirin EC  81 mg Oral QHS  . atorvastatin  40 mg Oral QHS  . calcitRIOL  0.25 mcg Oral Once per day on Mon Tue Wed Thu Fri  . gabapentin  300 mg Oral BID  . heparin  5,000 Units Subcutaneous Q8H  . insulin aspart  0-15 Units Subcutaneous TID WC  . insulin glargine  38 Units Subcutaneous QHS  . labetalol  300 mg Oral TID  . mycophenolate  180 mg Oral BID  . pantoprazole  40 mg Oral Daily  . predniSONE  5 mg Oral Q breakfast  . sulfamethoxazole-trimethoprim  1 tablet Oral Q M,W,F  . voriconazole  200 mg Oral Q12H   Continuous Infusions:  PRN Meds: acetaminophen, gi cocktail, HYDROcodone-acetaminophen, morphine injection, ondansetron (ZOFRAN) IV   Vital Signs    Vitals:   01/02/17 0100 01/02/17 0135 01/02/17 0355 01/02/17 0933  BP: 138/66 (!) 154/62 (!) 142/74 136/75  Pulse: 67 65 69   Resp: 16     Temp:  97.6 F (36.4 C) 98 F (36.7 C)   TempSrc:  Oral Oral   SpO2: 93% 93% 94%   Weight:  271 lb 3.2 oz (123 kg)    Height:  5' 10.5" (1.791 m)      Intake/Output Summary (Last 24 hours) at 01/02/2017 1004 Last data filed at 01/02/2017 0803 Gross per 24 hour  Intake 60 ml  Output 950 ml  Net -890 ml   Filed Weights   01/02/17 0135  Weight: 271 lb 3.2 oz (123 kg)    Telemetry    NSR - Personally Reviewed  ECG    NSR - Personally Reviewed  Physical Exam   GEN: No acute distress.  Morbidly obese  Neck: No JVD, obese, no bruits Cardiac: RRR, 2/6 SM, no rubs, or gallops.  Respiratory: Clear to auscultation bilaterally. GI: Soft, nontender, non-distended, obese  MS: 2+ bilateral LEE; No deformity. Neuro:  Nonfocal  Psych: Normal affect   Labs      Chemistry Recent Labs  Lab 01/01/17 1900 01/01/17 2024 01/02/17 0030  NA 137  --  138  K 5.6*  --  4.4  CL 106  --  104  CO2 23  --  25  GLUCOSE 108*  --  149*  BUN 42*  --  42*  CREATININE 2.54*  --  2.44*  CALCIUM 8.7*  --  8.7*  PROT  --  7.0  --   ALBUMIN  --  3.9  --   AST  --  28  --   ALT  --  31  --   ALKPHOS  --  106  --   BILITOT  --  0.5  --   GFRNONAA 25*  --  26*  GFRAA 29*  --  30*  ANIONGAP 8  --  9     Hematology Recent Labs  Lab 01/01/17 1900 01/02/17 0030  WBC 8.8 7.8  RBC 4.09* 4.09*  HGB 12.3* 12.1*  HCT 37.8* 37.9*  MCV 92.4 92.7  MCH 30.1 29.6  MCHC 32.5 31.9  RDW 16.3* 16.8*  PLT 114* 119*    Cardiac Enzymes Recent Labs  Lab 01/02/17 0029 01/02/17 0343  TROPONINI <0.03 <0.03    Recent Labs  Lab 01/01/17 1900 01/01/17 2145  TROPIPOC 0.00 0.00     BNP Recent Labs  Lab 01/02/17 0343  BNP 201.8*     DDimer No results for input(s): DDIMER in the last 168 hours.   Radiology    Dg Chest 2 View  Result Date: 01/01/2017 CLINICAL DATA:  Pt reports swelling to entire body x 2 days; CP, cough, and SOB since this morning. Nonsmoker. Hx of DM and HTN. EXAM: CHEST  2 VIEW COMPARISON:  03/16/2013 FINDINGS: Cardiac silhouette is normal in size. Loop recorder projects over the left heart. No mediastinal or hilar masses. No evidence of adenopathy. There are mildly prominent bronchovascular markings diffusely. Lungs otherwise clear. No pleural effusion or pneumothorax. Skeletal structures are intact. IMPRESSION: No active cardiopulmonary disease. Electronically Signed   By: Lajean Manes M.D.   On: 01/01/2017 19:32    Cardiac Studies   2D echo pending   Patient Profile     Jon [name] is a 66 y.o. male with a hx of left embolic MCA infarct in 1610 (loop recorder implanted w/ no detection of arrhthymias), HTN, HLD, IDDM, OSA and CKD s/p renal transplant in 2015 at [facility], who is being seen today for the evaluation of chest pain at  the request of Dr. Teryl Lucy, Internal Medicine.  Assessment & Plan    1. Acute CHF: Cardiac enzymes are negative x 2. Pt denies resting symptoms. Only symptomatic with physical activity (exertional dyspnea and chest pain). He remains volume overloaded and needs additional diuresis. RN reports IM consulted nephrology earlier this morning to assist with diuretic management given his CKD and h/o renal transplant. We will see how pt responds to diuresis. Can ambulate once euvolemic to see if any recurrent symptoms. Based on response to diuresis, he may need possible ischemic w/u if no improvement. We will also depend on recommendations regarding ischemic w/u from nephrology given his CKD. 2D pending to assess LVEF.   2. ESRD: management per nephrology. SCr trending downward with diuresis, down from 2.5>>2.4  3. IDDM: management per IM.   4. Obesity: Body mass index is 38.36 kg/m.   5. OSA: compliant with CPAP nightly.   6. HTN: currently controlled. Monitor.   7. HLD: LDL is 83 mg/dL. Per pharmacy, recommendations are to keep statin at current dose, given he is also on voriconazole for fungal infection on his hand.    For questions or updates, please contact [place] Please consult [email or web address] for contact info under Cardiology/STEMI.      Signed, Lyda Jester, PA-C  01/02/2017, 10:04 AM    Personally seen and examined. Agree with above.  66 year old male patient with renal transplant 2015, baseline creatinine between 2.2 and 2.5 here with chest tightness with exertion.  On exam, pleasant alert regular rate and rhythm2/6 SM, positive diffuse edema noted, abdomen soft. obese  Chest pain -At some point, we would like to proceed with cardiac catheterization but for now, let us consider continue diuresis to see how he feels.  Currently on IV Lasix 60 mg twice a day.  May need higher dosing.  Goal 1-2 L/day out.  Acute presumed diastolic heart failure -Awaiting  echocardiogram.  Status post renal transplant -Appreciate nephrology, Dr. Deterding's note reviewed.  Candee Furbish, MD

## 2017-01-02 NOTE — Progress Notes (Signed)
Pharmacy Antibiotic Note  Jon [name] is a 66 y.o. male admitted on 01/01/2017 with chest pain, anasarca.  Pharmacy has been consulted for Voriconazole dosing. Pt has been on voriconazole for about one month for a superficial fungal infection of the hand. Noted CKD in setting of renal transplant. LFTs WNL. Pt had a random voriconazole level of 1.7 on 12/13. Transplant meds were appropriately adjusted by his transplant team. MD questions if anasarca could be related to voriconazole; it is a possibility.   Plan: -Continue voriconazole 200 mg BID for now per discussion with [facility] to consult ID in the AM regarding adverse effects/continued therapy   Height: 5' 10.5" (179.1 cm) Weight: 271 lb 3.2 oz (123 kg) IBW/kg (Calculated) : 74.15  Temp (24hrs), Avg:97.9 F (36.6 C), Min:97.6 F (36.4 C), Max:98.2 F (36.8 C)  Recent Labs  Lab 01/01/17 1900 01/02/17 0030  WBC 8.8 7.8  CREATININE 2.54* 2.44*    Estimated Creatinine Clearance: 40 mL/min (A) (by C-G formula based on SCr of 2.44 mg/dL (H)).    Allergies  Allergen Reactions  . Lisinopril Other (See Comments)    Increased potassium level   . Meloxicam Nausea And Vomiting  . Victoza [Liraglutide] Diarrhea, Nausea And Vomiting and Swelling    Jon [name] 01/02/2017 2:44 AM

## 2017-01-03 LAB — RENAL FUNCTION PANEL
Albumin: 4 g/dL (ref 3.5–5.0)
Anion gap: 11 (ref 5–15)
BUN: 43 mg/dL — ABNORMAL HIGH (ref 6–20)
CALCIUM: 9.2 mg/dL (ref 8.9–10.3)
CHLORIDE: 103 mmol/L (ref 101–111)
CO2: 27 mmol/L (ref 22–32)
CREATININE: 2.72 mg/dL — AB (ref 0.61–1.24)
GFR calc Af Amer: 27 mL/min — ABNORMAL LOW (ref >60)
GFR calc non Af Amer: 23 mL/min — ABNORMAL LOW (ref >60)
GLUCOSE: 123 mg/dL — AB (ref 65–99)
Phosphorus: 3.6 mg/dL (ref 2.5–4.6)
Potassium: 4.3 mmol/L (ref 3.5–5.1)
SODIUM: 141 mmol/L (ref 135–145)

## 2017-01-03 LAB — GLUCOSE, CAPILLARY
GLUCOSE-CAPILLARY: 103 mg/dL — AB (ref 65–99)
GLUCOSE-CAPILLARY: 162 mg/dL — AB (ref 65–99)
GLUCOSE-CAPILLARY: 206 mg/dL — AB (ref 65–99)
GLUCOSE-CAPILLARY: 193 mg/dL — AB (ref 65–99)

## 2017-01-03 LAB — PARATHYROID HORMONE, INTACT (NO CA): PTH: 111 pg/mL — AB (ref 15–65)

## 2017-01-03 MED ORDER — LABETALOL HCL 200 MG PO TABS
200.0000 mg | ORAL_TABLET | Freq: Two times a day (BID) | ORAL | Status: DC
  Administered 2017-01-03 – 2017-01-04 (×2): 200 mg via ORAL
  Filled 2017-01-03 (×2): qty 1

## 2017-01-03 NOTE — Progress Notes (Signed)
Progress Note  Patient Name: Jon [name] Date of Encounter: 01/03/2017  Primary Cardiologist: No primary care provider on file.  Dr. Rayann Heman  Subjective   Breathing is a little bit better.  No chest pain, no bleeding, no syncope.  Inpatient Medications    Scheduled Meds: . amLODipine  5 mg Oral QHS  . aspirin EC  81 mg Oral QHS  . atorvastatin  40 mg Oral QHS  . calcitRIOL  0.25 mcg Oral Once per day on Mon Tue Wed Thu Fri  . furosemide  160 mg Oral TID  . gabapentin  300 mg Oral BID  . heparin  5,000 Units Subcutaneous Q8H  . insulin aspart  0-15 Units Subcutaneous TID WC  . insulin glargine  38 Units Subcutaneous QHS  . labetalol  300 mg Oral TID  . mycophenolate  180 mg Oral BID  . pantoprazole  40 mg Oral Daily  . predniSONE  5 mg Oral Q breakfast  . tacrolimus  0.5 mg Oral BID  . voriconazole  200 mg Oral Q12H   Continuous Infusions:  PRN Meds: acetaminophen, gi cocktail, HYDROcodone-acetaminophen, morphine injection, ondansetron (ZOFRAN) IV   Vital Signs    Vitals:   01/02/17 0933 01/02/17 1500 01/02/17 2021 01/03/17 0403  BP: 136/75 127/63 125/68 (!) 122/58  Pulse:   64 65  Resp:   18 16  Temp:  97.7 F (36.5 C) 98.7 F (37.1 C) 97.7 F (36.5 C)  TempSrc:  Axillary Oral Oral  SpO2:   92% 97%  Weight:    256 lb 6.4 oz (116.3 kg)  Height:        Intake/Output Summary (Last 24 hours) at 01/03/2017 0701 Last data filed at 01/03/2017 0402 Gross per 24 hour  Intake 0 ml  Output 2200 ml  Net -2200 ml   Filed Weights   01/02/17 0135 01/03/17 0403  Weight: 271 lb 3.2 oz (123 kg) 256 lb 6.4 oz (116.3 kg)    Telemetry    Normal sinus rhythm- Personally Reviewed  ECG    Normal sinus rhythm, no new EKG- Personally Reviewed  Physical Exam   GEN: No acute distress.  Obese Neck: No JVD Cardiac: RRR, 2/6 systolic murmur,no rubs, or gallops.  Respiratory: Clear to auscultation bilaterally. GI: Soft, nontender, non-distended  MS:  2+ lower  extremity edema; No deformity. Neuro:  Nonfocal  Psych: Normal affect   Labs    Chemistry Recent Labs  Lab 01/01/17 1900 01/01/17 2024 01/02/17 0030 01/03/17 0351  NA 137  --  138 141  K 5.6*  --  4.4 4.3  CL 106  --  104 103  CO2 23  --  25 27  GLUCOSE 108*  --  149* 123*  BUN 42*  --  42* 43*  CREATININE 2.54*  --  2.44* 2.72*  CALCIUM 8.7*  --  8.7* 9.2  PROT  --  7.0  --   --   ALBUMIN  --  3.9  --  4.0  AST  --  28  --   --   ALT  --  31  --   --   ALKPHOS  --  106  --   --   BILITOT  --  0.5  --   --   GFRNONAA 25*  --  26* 23*  GFRAA 29*  --  30* 27*  ANIONGAP 8  --  9 11     Hematology Recent Labs  Lab 01/01/17 1900 01/02/17  0030  WBC 8.8 7.8  RBC 4.09* 4.09*  HGB 12.3* 12.1*  HCT 37.8* 37.9*  MCV 92.4 92.7  MCH 30.1 29.6  MCHC 32.5 31.9  RDW 16.3* 16.8*  PLT 114* 119*    Cardiac Enzymes Recent Labs  Lab 01/02/17 0029 01/02/17 0343  TROPONINI <0.03 <0.03    Recent Labs  Lab 01/01/17 1900 01/01/17 2145  TROPIPOC 0.00 0.00     BNP Recent Labs  Lab 01/02/17 0343  BNP 201.8*     DDimer No results for input(s): DDIMER in the last 168 hours.   Radiology    Dg Chest 2 View  Result Date: 01/01/2017 CLINICAL DATA:  Pt reports swelling to entire body x 2 days; CP, cough, and SOB since this morning. Nonsmoker. Hx of DM and HTN. EXAM: CHEST  2 VIEW COMPARISON:  03/16/2013 FINDINGS: Cardiac silhouette is normal in size. Loop recorder projects over the left heart. No mediastinal or hilar masses. No evidence of adenopathy. There are mildly prominent bronchovascular markings diffusely. Lungs otherwise clear. No pleural effusion or pneumothorax. Skeletal structures are intact. IMPRESSION: No active cardiopulmonary disease. Electronically Signed   By: Lajean Manes M.D.   On: 01/01/2017 19:32    Cardiac Studies   Echocardiogram: 01/02/17 - Left ventricle: The cavity size was normal. There was moderate   concentric hypertrophy. Systolic function  was normal. The   estimated ejection fraction was in the range of 60% to 65%. Wall   motion was normal; there were no regional wall motion   abnormalities. Features are consistent with a pseudonormal left   ventricular filling pattern, with concomitant abnormal relaxation   and increased filling pressure (grade 2 diastolic dysfunction).   Doppler parameters are consistent with high ventricular filling   pressure. - Mitral valve: Calcified annulus. Valve area by continuity   equation (using LVOT flow): 1.74 cm^2. - Left atrium: The atrium was mildly dilated. - Right ventricle: Systolic function was mildly reduced. - Pulmonary arteries: PA peak pressure: 42 mm Hg (S).  Impressions:  - The right ventricular systolic pressure was increased consistent   with moderate pulmonary hypertension.  Patient Profile     66 y.o. male hx of left embolic MCA infarct in 9147 (loop recorder implanted w/ no detection of arrhthymias), HTN, HLD, IDDM, OSA and CKD s/p renal transplant in 2015 at [facility], moderate restrictive lung disease with decreased DLCO noted on PFTs here with diffuse edema, acute on chronic diastolic heart failure    Assessment & Plan    Acute diastolic heart failure on chronic -Good response to Lasix IV 60 mg twice a day.  Net out was -2.5 L -Creatinine has slightly increased from 2.5-2.7. - Still has volume overload.  Continue with diuresis unless renal team feels otherwise given his mild increase in creatinine.  Chest pain -On admission had some chest tightness especially with exertional component.  Awaiting potential cardiac catheterization however we must be careful with his renal transplant, creatinine of approximately 2.5-2.7.  Plan is to see how he feels after diuresis first. -He ambulated yesterday and did not have any difficulties, no anginal symptoms.  Excellent.  Status post renal transplant 2015 -Appreciate nephrology's input.  Creatinine slightly increased as  above.  Diabetes with hypertension -Per primary team.  Morbid obesity -Multiple comorbidities BMI of 38.  Continue to encourage weight loss  Pulmonary hypertension - Mild, 42 mmHg estimated on echocardiogram.  Likely secondary from both lung disease as well as morbid obesity.  Hypertensive heart disease with  heart failure -Currently well controlled.  Medications reviewed.  Hyperlipidemia -LDL 83.  Since he is on voriconazole, continue current statin dose.  For questions or updates, please contact [place] Please consult [email or web address] for contact info under Cardiology/STEMI.      Signed, Candee Furbish, MD  01/03/2017, 7:01 AM

## 2017-01-03 NOTE — Progress Notes (Signed)
PROGRESS NOTE    Jon [name]  ZJQ:[id number] DOB: 1950-02-14 DOA: 01/01/2017 PCP: [name], MD   Brief Narrative: Jon [name] is a 66 y.o. male with medical history significant of s/p renal transplant in 2015 on chronic immunosuppressive therapy, HTN, HLD, DM type II, OSA on CPAP. He presented with chest tightness/pain and found to have anasarca. Story is concerning for typical chest pain. Anasarca has responded somewhat to diuresis. Cardiology and nephrology on board.   Assessment & Plan:   Principal Problem:   Chest pain Active Problems:   Hyperkalemia   Diabetes mellitus (HCC)   History of kidney transplant   HTN (hypertension)   HLD (hyperlipidemia)   OSA (obstructive sleep apnea)   Dyspnea on exertion   Finger infection, fungal   Peripheral edema   Anasarca   Anasarca Improving with diuresis. UOP of 2.2 L of last 24 hours. -nephrology recommendations: diuresis  Chest pain Story sounds very typical. Cardiology consulted but hesitant to perform intervention secondary to kidney failure. Tightness resolved. Echo significant for PAH and grade 2 diastolic dysfunction -Cardiology recommendations  Acute kidney injury on CKD IV Baseline creatinine of 2.2-2.5.  Likely secondary to fluid overload.  History of kidney transplant Secondary to kidney failure. Patient is on Prograf. -nephrology recommendations -Continue Prograf  Fungal infection of left hand Patient follows with ID as an outpatient. He is on Variconazole treatment. Discussed with Dr. Linus Salmons, [place]. Unlikely causing anasarca -Continue Variconazole  Grade 2 diastolic heart dysfunction Patient is fluid overloaded, but unsure if this is an acute heart failure exacerbation versus general fluid overload in setting of kidney failure. No respiratory compromise.   DVT prophylaxis: Heparin subq Code Status: Full code Family Communication: Wife at bedside Disposition Plan: Pending cardiac workup and  improvement of anasarca   Consultants:   Cardiology  Nephrology  Procedures:   Echocardiogram pending  Antimicrobials:  Variconazole    Subjective: Swelling continues to improve  Objective: Vitals:   01/02/17 1500 01/02/17 2021 01/03/17 0403 01/03/17 0913  BP: 127/63 125/68 (!) 122/58 126/64  Pulse:  64 65   Resp:  18 16   Temp: 97.7 F (36.5 C) 98.7 F (37.1 C) 97.7 F (36.5 C)   TempSrc: Axillary Oral Oral   SpO2:  92% 97%   Weight:   116.3 kg (256 lb 6.4 oz)   Height:        Intake/Output Summary (Last 24 hours) at 01/03/2017 1437 Last data filed at 01/03/2017 1000 Gross per 24 hour  Intake 460 ml  Output 2250 ml  Net -1790 ml   Filed Weights   01/02/17 0135 01/03/17 0403  Weight: 123 kg (271 lb 3.2 oz) 116.3 kg (256 lb 6.4 oz)    Examination:  General exam: Appears calm and comfortable Respiratory system: Clear to auscultation. Respiratory effort normal. Cardiovascular system: S1 & S2 heard, RRR. 1/6 systolic murmur. Gastrointestinal system: Abdomen is nondistended, soft and nontender. No organomegaly or masses felt. Normal bowel sounds heard. Central nervous system: Alert and oriented. No focal neurological deficits. Extremities: 2+ edema in LE. No calf tenderness Skin: No cyanosis. No rashes Psychiatry: Judgement and insight appear normal. Mood & affect appropriate.     Data Reviewed: I have personally reviewed following labs and imaging studies  CBC: Recent Labs  Lab 01/01/17 1900 01/02/17 0030  WBC 8.8 7.8  NEUTROABS  --  6.2  HGB 12.3* 12.1*  HCT 37.8* 37.9*  MCV 92.4 92.7  PLT 114* 119*  Basic Metabolic Panel: Recent Labs  Lab 01/01/17 1900 01/02/17 0030 01/03/17 0351  NA 137 138 141  K 5.6* 4.4 4.3  CL 106 104 103  CO2 23 25 27   GLUCOSE 108* 149* 123*  BUN 42* 42* 43*  CREATININE 2.54* 2.44* 2.72*  CALCIUM 8.7* 8.7* 9.2  PHOS  --   --  3.6   GFR: Estimated Creatinine Clearance: 34.8 mL/min (A) (by C-G formula  based on SCr of 2.72 mg/dL (H)). Liver Function Tests: Recent Labs  Lab 01/01/17 2024 01/03/17 0351  AST 28  --   ALT 31  --   ALKPHOS 106  --   BILITOT 0.5  --   PROT 7.0  --   ALBUMIN 3.9 4.0   No results for input(s): LIPASE, AMYLASE in the last 168 hours. No results for input(s): AMMONIA in the last 168 hours. Coagulation Profile: No results for input(s): INR, PROTIME in the last 168 hours. Cardiac Enzymes: Recent Labs  Lab 01/02/17 0029 01/02/17 0343  TROPONINI <0.03 <0.03   BNP (last 3 results) No results for input(s): PROBNP in the last 8760 hours. HbA1C: No results for input(s): HGBA1C in the last 72 hours. CBG: Recent Labs  Lab 01/02/17 1118 01/02/17 1620 01/02/17 2146 01/03/17 0736 01/03/17 1217  GLUCAP 101* 174* 133* 103* 162*   Lipid Profile: Recent Labs    01/02/17 0343  CHOL 137  HDL 38*  LDLCALC 83  TRIG 81  CHOLHDL 3.6   Thyroid Function Tests: Recent Labs    01/02/17 0029  TSH 1.543   Anemia Panel: No results for input(s): VITAMINB12, FOLATE, FERRITIN, TIBC, IRON, RETICCTPCT in the last 72 hours. Sepsis Labs: No results for input(s): PROCALCITON, LATICACIDVEN in the last 168 hours.  No results found for this or any previous visit (from the past 240 hour(s)).       Radiology Studies: Dg Chest 2 View  Result Date: 01/01/2017 CLINICAL DATA:  Pt reports swelling to entire body x 2 days; CP, cough, and SOB since this morning. Nonsmoker. Hx of DM and HTN. EXAM: CHEST  2 VIEW COMPARISON:  03/16/2013 FINDINGS: Cardiac silhouette is normal in size. Loop recorder projects over the left heart. No mediastinal or hilar masses. No evidence of adenopathy. There are mildly prominent bronchovascular markings diffusely. Lungs otherwise clear. No pleural effusion or pneumothorax. Skeletal structures are intact. IMPRESSION: No active cardiopulmonary disease. Electronically Signed   By: Lajean Manes M.D.   On: 01/01/2017 19:32        Scheduled  Meds: . aspirin EC  81 mg Oral QHS  . atorvastatin  40 mg Oral QHS  . calcitRIOL  0.25 mcg Oral Once per day on Mon Tue Wed Thu Fri  . furosemide  160 mg Oral TID  . gabapentin  300 mg Oral BID  . heparin  5,000 Units Subcutaneous Q8H  . insulin aspart  0-15 Units Subcutaneous TID WC  . insulin glargine  38 Units Subcutaneous QHS  . labetalol  200 mg Oral BID  . mycophenolate  180 mg Oral BID  . pantoprazole  40 mg Oral Daily  . predniSONE  5 mg Oral Q breakfast  . tacrolimus  0.5 mg Oral BID  . voriconazole  200 mg Oral Q12H   Continuous Infusions:   LOS: 1 day     Cordelia Poche, MD [facility] 01/03/2017, 2:37 PM Pager: [phone]  If 7PM-7AM, please contact night-coverage [email or web address] Password [facility] 01/03/2017, 2:37 PM

## 2017-01-03 NOTE — Progress Notes (Signed)
Subjective: Interval History: has no complaint, feels better..  Objective: Vital signs in last 24 hours: Temp:  [97.7 F (36.5 C)-98.7 F (37.1 C)] 97.7 F (36.5 C) (12/25 0403) Pulse Rate:  [64-65] 65 (12/25 0403) Resp:  [16-18] 16 (12/25 0403) BP: (122-127)/(58-68) 126/64 (12/25 0913) SpO2:  [92 %-97 %] 97 % (12/25 0403) Weight:  [116.3 kg (256 lb 6.4 oz)] 116.3 kg (256 lb 6.4 oz) (12/25 0403) Weight change: -6.713 kg (-12.8 oz)  Intake/Output from previous day: 12/24 0701 - 12/25 0700 In: 0  Out: 2200 [Urine:2200] Intake/Output this shift: Total I/O In: 100 [P.O.:100] Out: 650 [Urine:650]  General appearance: alert, cooperative, no distress, morbidly obese and pale Resp: diminished breath sounds bilaterally and rales bibasilar Cardio: S1, S2 normal and systolic murmur: systolic ejection 2/6, decrescendo at 2nd left intercostal space GI: obese, TX RLQ. liver down 6 cm Extremities: edema 3-4+  Lab Results: Recent Labs    01/01/17 1900 01/02/17 0030  WBC 8.8 7.8  HGB 12.3* 12.1*  HCT 37.8* 37.9*  PLT 114* 119*   BMET:  Recent Labs    01/02/17 0030 01/03/17 0351  NA 138 141  K 4.4 4.3  CL 104 103  CO2 25 27  GLUCOSE 149* 123*  BUN 42* 43*  CREATININE 2.44* 2.72*  CALCIUM 8.7* 9.2   Recent Labs    01/02/17 1425  PTH 111*   Iron Studies: No results for input(s): IRON, TIBC, TRANSFERRIN, FERRITIN in the last 72 hours.  Studies/Results: Dg Chest 2 View  Result Date: 01/01/2017 CLINICAL DATA:  Pt reports swelling to entire body x 2 days; CP, cough, and SOB since this morning. Nonsmoker. Hx of DM and HTN. EXAM: CHEST  2 VIEW COMPARISON:  03/16/2013 FINDINGS: Cardiac silhouette is normal in size. Loop recorder projects over the left heart. No mediastinal or hilar masses. No evidence of adenopathy. There are mildly prominent bronchovascular markings diffusely. Lungs otherwise clear. No pleural effusion or pneumothorax. Skeletal structures are intact.  IMPRESSION: No active cardiopulmonary disease. Electronically Signed   By: Lajean Manes M.D.   On: 01/01/2017 19:32    I have reviewed the patient's current medications.  Assessment/Plan: 1 Renal TX  Cr ^ mild, diuresing. No indic to change po diuretics 2 HTN lower, lower meds 3 Obesity 4 ?CAD per Cards 5 HPTH vit D  6 Fungal infx In hand P Prograf level, cont po Lasix, vit D,    LOS: 1 day   Jon [name] 01/03/2017,11:05 AM

## 2017-01-03 NOTE — Progress Notes (Signed)
Patient stated that he would be able to apply CPAP himself when he was ready for bed.     01/03/17 2154  BiPAP/CPAP/SIPAP  BiPAP/CPAP/SIPAP Pt Type Adult  Mask Type Full face mask  Mask Size Medium  EPAP 4 cmH2O  Oxygen Percent 21 %  BiPAP/CPAP/SIPAP CPAP  Patient Home Equipment No  Auto Titrate No

## 2017-01-03 NOTE — Plan of Care (Signed)
  Education: Knowledge of General Education information will improve 01/03/2017 0229 - Progressing by Anson Fret, RN Note POC reviewed with pt.

## 2017-01-04 DIAGNOSIS — L089 Local infection of the skin and subcutaneous tissue, unspecified: Secondary | ICD-10-CM

## 2017-01-04 DIAGNOSIS — Z794 Long term (current) use of insulin: Secondary | ICD-10-CM

## 2017-01-04 DIAGNOSIS — R609 Edema, unspecified: Secondary | ICD-10-CM

## 2017-01-04 DIAGNOSIS — I5033 Acute on chronic diastolic (congestive) heart failure: Secondary | ICD-10-CM

## 2017-01-04 DIAGNOSIS — E118 Type 2 diabetes mellitus with unspecified complications: Secondary | ICD-10-CM

## 2017-01-04 DIAGNOSIS — G4733 Obstructive sleep apnea (adult) (pediatric): Secondary | ICD-10-CM

## 2017-01-04 DIAGNOSIS — R079 Chest pain, unspecified: Secondary | ICD-10-CM

## 2017-01-04 LAB — GLUCOSE, CAPILLARY
GLUCOSE-CAPILLARY: 179 mg/dL — AB (ref 65–99)
Glucose-Capillary: 101 mg/dL — ABNORMAL HIGH (ref 65–99)

## 2017-01-04 LAB — RENAL FUNCTION PANEL
ALBUMIN: 3.7 g/dL (ref 3.5–5.0)
Anion gap: 9 (ref 5–15)
BUN: 41 mg/dL — AB (ref 6–20)
CALCIUM: 9.1 mg/dL (ref 8.9–10.3)
CO2: 27 mmol/L (ref 22–32)
CREATININE: 2.74 mg/dL — AB (ref 0.61–1.24)
Chloride: 104 mmol/L (ref 101–111)
GFR, EST AFRICAN AMERICAN: 26 mL/min — AB (ref >60)
GFR, EST NON AFRICAN AMERICAN: 23 mL/min — AB (ref >60)
Glucose, Bld: 105 mg/dL — ABNORMAL HIGH (ref 65–99)
Phosphorus: 3.6 mg/dL (ref 2.5–4.6)
Potassium: 3.9 mmol/L (ref 3.5–5.1)
SODIUM: 140 mmol/L (ref 135–145)

## 2017-01-04 LAB — CBC
HCT: 41.2 % (ref 39.0–52.0)
Hemoglobin: 13.6 g/dL (ref 13.0–17.0)
MCH: 30.1 pg (ref 26.0–34.0)
MCHC: 33 g/dL (ref 30.0–36.0)
MCV: 91.2 fL (ref 78.0–100.0)
PLATELETS: 133 10*3/uL — AB (ref 150–400)
RBC: 4.52 MIL/uL (ref 4.22–5.81)
RDW: 16.2 % — ABNORMAL HIGH (ref 11.5–15.5)
WBC: 7.2 10*3/uL (ref 4.0–10.5)

## 2017-01-04 MED ORDER — FUROSEMIDE 80 MG PO TABS: 160.0000 mg | ORAL_TABLET | Freq: Three times a day (TID) | ORAL | 0 refills | Status: DC

## 2017-01-04 NOTE — Discharge Instructions (Signed)

## 2017-01-04 NOTE — Progress Notes (Addendum)
Progress Note  Patient Name: Jon [name] Date of Encounter: 01/04/2017  Primary Cardiologist: Dr. Rayann Heman   Subjective   Feeling better. Breathing improved with diuresis.   Inpatient Medications    Scheduled Meds: . aspirin EC  81 mg Oral QHS  . atorvastatin  40 mg Oral QHS  . calcitRIOL  0.25 mcg Oral Once per day on Mon Tue Wed Thu Fri  . furosemide  160 mg Oral TID  . gabapentin  300 mg Oral BID  . heparin  5,000 Units Subcutaneous Q8H  . insulin aspart  0-15 Units Subcutaneous TID WC  . insulin glargine  38 Units Subcutaneous QHS  . labetalol  200 mg Oral BID  . mycophenolate  180 mg Oral BID  . pantoprazole  40 mg Oral Daily  . predniSONE  5 mg Oral Q breakfast  . tacrolimus  0.5 mg Oral BID  . voriconazole  200 mg Oral Q12H   Continuous Infusions:  PRN Meds: acetaminophen, gi cocktail, HYDROcodone-acetaminophen, morphine injection, ondansetron (ZOFRAN) IV   Vital Signs    Vitals:   01/03/17 1522 01/03/17 2052 01/04/17 0355 01/04/17 0400  BP: 139/63 140/72  136/70  Pulse: 69 66    Resp: 20 20  14   Temp: 98.2 F (36.8 C) 98.6 F (37 C)  98.1 F (36.7 C)  TempSrc: Oral Oral  Oral  SpO2: 98% 100%    Weight:   251 lb 1.6 oz (113.9 kg)   Height:        Intake/Output Summary (Last 24 hours) at 01/04/2017 0900 Last data filed at 01/04/2017 0700 Gross per 24 hour  Intake 1295 ml  Output 2675 ml  Net -1380 ml   Filed Weights   01/02/17 0135 01/03/17 0403 01/04/17 0355  Weight: 271 lb 3.2 oz (123 kg) 256 lb 6.4 oz (116.3 kg) 251 lb 1.6 oz (113.9 kg)    Telemetry    NSR - Personally Reviewed  ECG    NSR - Personally Reviewed  Physical Exam   GEN: No acute distress. Obese    Neck: No JVD Cardiac: RRR, no murmurs, rubs, or gallops.  Respiratory: Clear to auscultation bilaterally. GI: Soft, nontender, non-distended  MS: 1+ pitting LEE; No deformity. Neuro:  Nonfocal  Psych: Normal affect   Labs    Chemistry Recent Labs  Lab  01/01/17 2024 01/02/17 0030 01/03/17 0351 01/04/17 0444  NA  --  138 141 140  K  --  4.4 4.3 3.9  CL  --  104 103 104  CO2  --  25 27 27   GLUCOSE  --  149* 123* 105*  BUN  --  42* 43* 41*  CREATININE  --  2.44* 2.72* 2.74*  CALCIUM  --  8.7* 9.2 9.1  PROT 7.0  --   --   --   ALBUMIN 3.9  --  4.0 3.7  AST 28  --   --   --   ALT 31  --   --   --   ALKPHOS 106  --   --   --   BILITOT 0.5  --   --   --   GFRNONAA  --  26* 23* 23*  GFRAA  --  30* 27* 26*  ANIONGAP  --  9 11 9      Hematology Recent Labs  Lab 01/01/17 1900 01/02/17 0030  WBC 8.8 7.8  RBC 4.09* 4.09*  HGB 12.3* 12.1*  HCT 37.8* 37.9*  MCV 92.4 92.7  MCH 30.1 29.6  MCHC 32.5 31.9  RDW 16.3* 16.8*  PLT 114* 119*    Cardiac Enzymes Recent Labs  Lab 01/02/17 0029 01/02/17 0343  TROPONINI <0.03 <0.03    Recent Labs  Lab 01/01/17 1900 01/01/17 2145  TROPIPOC 0.00 0.00     BNP Recent Labs  Lab 01/02/17 0343  BNP 201.8*     DDimer No results for input(s): DDIMER in the last 168 hours.   Radiology    No results found.  Cardiac Studies   2D Echo 01/02/17 Study Conclusions  - Left ventricle: The cavity size was normal. There was moderate   concentric hypertrophy. Systolic function was normal. The   estimated ejection fraction was in the range of 60% to 65%. Wall   motion was normal; there were no regional wall motion   abnormalities. Features are consistent with a pseudonormal left   ventricular filling pattern, with concomitant abnormal relaxation   and increased filling pressure (grade 2 diastolic dysfunction).   Doppler parameters are consistent with high ventricular filling   pressure. - Mitral valve: Calcified annulus. Valve area by continuity   equation (using LVOT flow): 1.74 cm^2. - Left atrium: The atrium was mildly dilated. - Right ventricle: Systolic function was mildly reduced. - Pulmonary arteries: PA peak pressure: 42 mm Hg (S).  Impressions:  - The right  ventricular systolic pressure was increased consistent   with moderate pulmonary hypertension.  Patient Profile     Jon [name] a 66 y.o.malewith a hx of left embolic MCA infarct in 4315 (loop recorder implanted w/ no detection of arrhthymias), HTN, HLD, IDDM, OSA and CKD s/p renal transplant in 2015 at [facility] for acute on chronic diastolic CHF.  Assessment & Plan    1. Acute on Chronic Diastolic CHF: Echo shows normal LVEF at 60-65% and G2DD. Doppler parameters are consistent with high ventricular filling pressure. Moderate pulmonary HTN also noted. Admit BNP was only 201, however likely falsely low in the setting of obesity. He is diuresing with lasix. Edema improved, down from 2+ to 1 + pitting. Lungs exam improved. Dyspnea improved. Nephrology assisting w/ diuretic dosing given CKD and h/o renal transplant. I/Os in last 24 hrs -1.4L. Net I/Os since admit -3.9L. Slight bump in SCr from 2.4>>2.7. Weight trending down from 256>>251 lb. From a cardiac standpoint, we feel that the patient can continue diuresis at home. Stable from a cardiac standpoint, however will defer timing of discharge to nephrology and IM. He will need close cardiac and renal f/u, 1-2 weeks post discharge.   For questions or updates, please contact [place] Please consult [email or web address] for contact info under Cardiology/STEMI.      Signed, Lyda Jester, PA-C  01/04/2017, 9:00 AM    Personally seen and examined. Agree with above. Feeling better, breathing better, ambulated hallway without any difficulties.  Edema has decreased.  Exam reveals alert and oriented x3 male, morbidly obese, 1+ pitting edema bilaterally improved, lungs are clear  Creatinine currently stable at 2.7 as it was yesterday.  Assessment: Acute on chronic diastolic heart failure, status post renal transplant 2015, current voriconazole use for finger infection, diabetes with hypertension, morbid obesity  - His edema and  shortness of breath have much improved.  I think he still has more room to go however he can complete this as outpatient with close follow-up with basic metabolic profile and nephrology watch. -Discharge Lasix dose per nephrology.  Currently on high-dose p.o. Lasix.  See above. -No further chest pain  at this time.  Remember, we were hesitant to perform diagnostic coronary angiography because of his renal transplant as well as creatinine of 2.7.  Some of his symptoms were likely related to his acute diastolic heart failure and have subsequently resolved.  He has not described any anginal symptoms recently.  I think we can hold off on ischemic evaluation at this time. -  Okay for discharge from our perspective.  Candee Furbish, MD

## 2017-01-04 NOTE — Progress Notes (Signed)
Pt discharged to home. PIV removed, AVS reviewed. Pt to follow up with PCP and nephrologist. Pt left unit via wheelchair, belongings in hand. Pt to be transported home by wife.

## 2017-01-04 NOTE — Discharge Summary (Signed)
Physician Discharge Summary  Jon [name] ZYY:[id number] DOB: 1950-09-17 DOA: 01/01/2017  PCP: [name], MD  Admit date: 01/01/2017 Discharge date: 01/04/2017  Admitted From: Home Disposition: Home  Recommendations for Outpatient Follow-up:  1. Follow up with PCP in 1 week 2. Follow up with nephrology in 5 days to recheck metabolic panel 3. Repeat CBC to recheck platelets 4. Follow up with Dr. Louie Boston in 10 days 5. Please follow up on the following pending results: Tacrolimus level  Home Health: None Equipment/Devices: None  Discharge Condition: Stable CODE STATUS: Full code Diet recommendation: Heart healthy, fluid restriction   Brief/Interim Summary:  Admission HPI written by Norval Morton, MD   HPI: Jon [name] is a 66 y.o. male with medical history significant of s/p renal transplant in 2015 on chronic immunosuppressive therapy, HTN, HLD, DM type II, OSA on CPAP, and remote history of tobacco abuse; who presents with complaints of intermittent chest pain with exertion.  Over the last 2-3 days patient reports having intermittent pains across his chest with exertion and feels like pressure.  Pain does not necessarily radiate and symptoms can last several minutes and are usually resolved with rest.  Associated symptoms include shortness of breath,  weight gain of unknown amount, and reports of worsening generalized whole body swelling.  Patient was diagnosed with some kind of fungal infection of his left middle finger for which she was started on voriconazole on October 31 by infectious disease.  At that time his transplant specialist was notified and they decreased his Prograf to 0.5 mg twice daily and put his Flomax on hold.  The wound of his hand has been healing well and he has had no other issues from it.  On 12/13, he was seen by his nephrologist Dr. Rolan Lipa who had increased his Lasix to 80 mg twice daily and voriconazole levels were noted to be  1.7.  Lastly he was started on Anoro by his pulmonologist.  However notes that this medication has helped him.  Denies having any palpitations, loss of consciousness, fever, chills, calf pain, nausea, vomiting, or diarrhea.  ED Course: On admission into the emergency department patient was seen to be afebrile with vital signs relatively within normal limits.  Labs revealed WBC 8.8, hemoglobin 12.3, platelets 114, potassium 5.6, BUN 42, creatinine 2.54, and troponin 0.0 x 2.  Chest x-ray showed no acute cardiopulmonary disease.  Urinalysis showed no acute abnormalities or signs of significant protein.  Prograf level is pending.  Cardiology was consulted and recommended    Hospital course:  Anasarca Improved with diuresis. Total urine output of 3 L. Weight loss of about 9.1 kg, although unsure if this is completely accurate. Nephrology diuresed patient with IV Lasix. Continue Lasix on discharge. Will need repeat lab work in 4-5 days and close follow-up by nephrology as an outpatient.  Chest pain Acute on chronic diastolic heart failure Story sounds very typical. Cardiology consulted but hesitant to perform intervention secondary to significant kidney injury. Tightness resolved with diuresis. Echo significant for PAH and grade 2 diastolic dysfunction. Normal EF. Resolved with diuresis.  Acute kidney injury on CKD IV Baseline creatinine of 2.2-2.5.  Likely secondary to fluid overload. Stable at 2.7 at discharge.  History of kidney transplant Secondary to kidney failure. Patient is on Prograf which was continued. Level obtained and pending prior to discharge. Follow-up at [facility].  Fungal infection of left hand Patient follows with ID as an outpatient. He is on Variconazole treatment.  Discussed with Dr. Linus Salmons, [place]. Unlikely causing anasarca. Continued Variconazole. Outpatient follow-up with infectious disease as previously scheduled.    Discharge Diagnoses:  Principal Problem:    Chest pain Active Problems:   Hyperkalemia   Diabetes mellitus (HCC)   History of kidney transplant   HTN (hypertension)   HLD (hyperlipidemia)   OSA (obstructive sleep apnea)   Dyspnea on exertion   Finger infection, fungal   Peripheral edema   Anasarca   Acute on chronic diastolic CHF (congestive heart failure) [facility])    Discharge Instructions  Discharge Instructions    Call MD for:  extreme fatigue   Complete by:  As directed    Call MD for:  persistant dizziness or light-headedness   Complete by:  As directed    Diet - low sodium heart healthy   Complete by:  As directed    Increase activity slowly   Complete by:  As directed      Allergies as of 01/04/2017      Reactions   Lisinopril Other (See Comments)   Increased potassium level   Meloxicam Nausea And Vomiting   Victoza [liraglutide] Diarrhea, Nausea And Vomiting, Swelling      Medication List    STOP taking these medications   amLODipine 10 MG tablet Commonly known as:  NORVASC     TAKE these medications   aspirin EC 81 MG tablet Take 81 mg by mouth at bedtime.   atorvastatin 40 MG tablet Commonly known as:  LIPITOR Take 40 mg by mouth at bedtime.   calcitRIOL 0.25 MCG capsule Commonly known as:  ROCALTROL Take 0.25 mcg by mouth See admin instructions. Take 1 capsule (0.25 mcg) by mouth five times daily - Monday thru Friday   furosemide 80 MG tablet Commonly known as:  LASIX Take 2 tablets (160 mg total) by mouth 3 (three) times daily. What changed:    how much to take  when to take this  additional instructions   gabapentin 300 MG capsule Commonly known as:  NEURONTIN Take 300 mg by mouth 2 (two) times daily.   HUMALOG KWIKPEN 100 UNIT/ML KiwkPen Generic drug:  insulin lispro Inject 5-15 Units into the skin See admin instructions. Inject 5 units subcutaneously three times daily before meals adjusted per carb count - add 2 units for every 50 carbs to be consumed    HYDROcodone-acetaminophen 10-325 MG tablet Commonly known as:  NORCO Take 1-2 tablets by mouth every 6 (six) hours as needed (pain).   insulin glargine 100 unit/mL Sopn Commonly known as:  LANTUS Inject 48 Units into the skin at bedtime.   labetalol 300 MG tablet Commonly known as:  NORMODYNE Take 300 mg by mouth 3 (three) times daily.   magnesium oxide 400 MG tablet Commonly known as:  MAG-OX Take 400 mg by mouth daily.   multivitamin with minerals Tabs tablet Take 1 tablet by mouth daily.   mycophenolate 180 MG EC tablet Commonly known as:  MYFORTIC Take 180 mg by mouth 2 (two) times daily.   omeprazole 20 MG capsule Commonly known as:  PRILOSEC Take 20 mg by mouth daily.   ONE TOUCH ULTRA 2 w/Device Kit   Potassium Citrate 15 MEQ (1620 MG) Tbcr Take 1 tablet by mouth 3 (three) times daily.   predniSONE 5 MG tablet Commonly known as:  DELTASONE Take 5 mg by mouth daily with breakfast.   sulfamethoxazole-trimethoprim 400-80 MG tablet Commonly known as:  BACTRIM,SEPTRA Take 1 tablet by mouth every Monday, Wednesday,  and Friday.   tacrolimus 0.5 MG capsule Commonly known as:  PROGRAF Take 0.5 mg by mouth 2 (two) times daily.   tamsulosin 0.4 MG Caps capsule Commonly known as:  FLOMAX Take 0.4 mg by mouth daily after breakfast.   vitamin B-12 1000 MCG tablet Commonly known as:  CYANOCOBALAMIN Take 1,000 mcg by mouth 2 (two) times daily.   voriconazole 200 MG tablet Commonly known as:  VFEND Take 1 tablet (200 mg total) 2 (two) times daily by mouth.      Follow-up Information    Deterding, Jeneen Rinks, MD. Schedule an appointment as soon as possible for a visit on 01/09/2017.   Specialty:  Nephrology Why:  Lab check Contact information: [place] [address] [phone]        Corliss Parish, MD. Schedule an appointment as soon as possible for a visit in 10 day(s).   Specialty:  Nephrology Contact information: [place] [address] [phone]        [name], MD Follow up in 1 week(s).   Specialty:  Family Medicine Contact information: [place] [address] [phone]          Allergies  Allergen Reactions  . Lisinopril Other (See Comments)    Increased potassium level   . Meloxicam Nausea And Vomiting  . Victoza [Liraglutide] Diarrhea, Nausea And Vomiting and Swelling    Consultations:  Nephrology  Cardiology   Procedures/Studies: Dg Chest 2 View  Result Date: 01/01/2017 CLINICAL DATA:  Pt reports swelling to entire body x 2 days; CP, cough, and SOB since this morning. Nonsmoker. Hx of DM and HTN. EXAM: CHEST  2 VIEW COMPARISON:  03/16/2013 FINDINGS: Cardiac silhouette is normal in size. Loop recorder projects over the left heart. No mediastinal or hilar masses. No evidence of adenopathy. There are mildly prominent bronchovascular markings diffusely. Lungs otherwise clear. No pleural effusion or pneumothorax. Skeletal structures are intact. IMPRESSION: No active cardiopulmonary disease. Electronically Signed   By: Lajean Manes M.D.   On: 01/01/2017 19:32     Echocardiogram (01/05/2017) Study Conclusions  - Left ventricle: The cavity size was normal. There was moderate   concentric hypertrophy. Systolic function was normal. The   estimated ejection fraction was in the range of 60% to 65%. Wall   motion was normal; there were no regional wall motion   abnormalities. Features are consistent with a pseudonormal left   ventricular filling pattern, with concomitant abnormal relaxation   and increased filling pressure (grade 2 diastolic dysfunction).   Doppler parameters are consistent with high ventricular filling   pressure. - Mitral valve: Calcified annulus. Valve area by continuity   equation (using LVOT flow): 1.74 cm^2. - Left atrium: The atrium was mildly dilated. - Right ventricle: Systolic function was mildly reduced. -  Pulmonary arteries: PA peak pressure: 42 mm Hg (S).  Impressions:  - The right ventricular systolic pressure was increased consistent   with moderate pulmonary hypertension.   Subjective: No dyspnea or chest pain. Swelling improved.  Discharge Exam: Vitals:   01/04/17 0400 01/04/17 0903  BP: 136/70 (!) 112/59  Pulse:  70  Resp: 14   Temp: 98.1 F (36.7 C)   SpO2:     Vitals:   01/03/17 2052 01/04/17 0355 01/04/17 0400 01/04/17 0903  BP: 140/72  136/70 (!) 112/59  Pulse: 66   70  Resp: 20  14   Temp: 98.6 F (37 C)  98.1 F (36.7 C)  TempSrc: Oral  Oral   SpO2: 100%     Weight:  113.9 kg (251 lb 1.6 oz)    Height:        General exam: Appears calm and comfortable Respiratory system: Clear to auscultation. Respiratory effort normal. Cardiovascular system: S1 & S2 heard, RRR. 1/6 systolic murmur. Gastrointestinal system: Abdomen is nondistended, soft and nontender. No organomegaly or masses felt. Normal bowel sounds heard. Central nervous system: Alert and oriented. No focal neurological deficits. Extremities: 2+ edema in LE. No calf tenderness Skin: No cyanosis. No rashes Psychiatry: Judgement and insight appear normal. Mood & affect appropriate.     The results of significant diagnostics from this hospitalization (including imaging, microbiology, ancillary and laboratory) are listed below for reference.     Microbiology: No results found for this or any previous visit (from the past 240 hour(s)).   Labs: BNP (last 3 results) Recent Labs    01/02/17 0343  BNP 856.3*   Basic Metabolic Panel: Recent Labs  Lab 01/01/17 1900 01/02/17 0030 01/03/17 0351 01/04/17 0444  NA 137 138 141 140  K 5.6* 4.4 4.3 3.9  CL 106 104 103 104  CO2 _0 GLUCOSE 108* 149* 123* 105*  BUN 42* 42* 43* 41*  CREATININE 2.54* 2.44* 2.72* 2.74*  CALCIUM 8.7* 8.7* 9.2 9.1  PHOS  --   --  3.6 3.6   Liver Function Tests: Recent Labs  Lab 01/01/17 2024  01/03/17 0351 01/04/17 0444  AST 28  --   --   ALT 31  --   --   ALKPHOS 106  --   --   BILITOT 0.5  --   --   PROT 7.0  --   --   ALBUMIN 3.9 4.0 3.7   No results for input(s): LIPASE, AMYLASE in the last 168 hours. No results for input(s): AMMONIA in the last 168 hours. CBC: Recent Labs  Lab 01/01/17 1900 01/02/17 0030 01/04/17 0839  WBC 8.8 7.8 7.2  NEUTROABS  --  6.2  --   HGB 12.3* 12.1* 13.6  HCT 37.8* 37.9* 41.2  MCV 92.4 92.7 91.2  PLT 114* 119* 133*   Cardiac Enzymes: Recent Labs  Lab 01/02/17 0029 01/02/17 0343  TROPONINI <0.03 <0.03   BNP: Invalid input(s): POCBNP CBG: Recent Labs  Lab 01/03/17 1217 01/03/17 1729 01/03/17 2051 01/04/17 0736 01/04/17 1049  GLUCAP 162* 206* 193* 101* 179*   D-Dimer No results for input(s): DDIMER in the last 72 hours. Hgb A1c No results for input(s): HGBA1C in the last 72 hours. Lipid Profile Recent Labs    01/02/17 0343  CHOL 137  HDL 38*  LDLCALC 83  TRIG 81  CHOLHDL 3.6   Thyroid function studies Recent Labs    01/02/17 0029  TSH 1.543   Anemia work up No results for input(s): VITAMINB12, FOLATE, FERRITIN, TIBC, IRON, RETICCTPCT in the last 72 hours. Urinalysis    Component Value Date/Time   COLORURINE STRAW (A) 01/01/2017 2212   APPEARANCEUR CLEAR 01/01/2017 2212   LABSPEC 1.010 01/01/2017 2212   PHURINE 7.0 01/01/2017 2212   GLUCOSEU NEGATIVE 01/01/2017 2212   HGBUR NEGATIVE 01/01/2017 2212   BILIRUBINUR NEGATIVE 01/01/2017 2212   KETONESUR NEGATIVE 01/01/2017 2212   PROTEINUR NEGATIVE 01/01/2017 2212   UROBILINOGEN 0.2 01/23/2011 0551   NITRITE NEGATIVE 01/01/2017 2212   LEUKOCYTESUR NEGATIVE 01/01/2017 2212     SIGNED:   Cordelia Poche, MD [facility] 01/04/2017, 1:10 PM Pager [phone]  If 7PM-7AM, please contact night-coverage [email or web address] Password [email or web address]

## 2017-01-04 NOTE — Progress Notes (Signed)
Subjective: Interval History: has no complaint, feels better, wants to go home.  Objective: Vital signs in last 24 hours: Temp:  [98.1 F (36.7 C)-98.6 F (37 C)] 98.1 F (36.7 C) (12/26 0400) Pulse Rate:  [66-70] 70 (12/26 0903) Resp:  [14-20] 14 (12/26 0400) BP: (112-140)/(59-72) 112/59 (12/26 0903) SpO2:  [98 %-100 %] 100 % (12/25 2052) Weight:  [113.9 kg (251 lb 1.6 oz)] 113.9 kg (251 lb 1.6 oz) (12/26 0355) Weight change: -2.404 kg (-4.8 oz)  Intake/Output from previous day: 12/25 0701 - 12/26 0700 In: 1655 [P.O.:1655] Out: 3075 [Urine:3075] Intake/Output this shift: Total I/O In: 360 [P.O.:360] Out: -   General appearance: alert, cooperative, no distress and moderately obese Resp: rales bibasilar Cardio: S1, S2 normal and systolic murmur: holosystolic 2/6, blowing at apex GI: obese, pos bs. Liver down 6 cm, TX RLQ Extremities: edema 2+ and AVF LUA  Lab Results: Recent Labs    01/02/17 0030 01/04/17 0839  WBC 7.8 7.2  HGB 12.1* 13.6  HCT 37.9* 41.2  PLT 119* 133*   BMET:  Recent Labs    01/03/17 0351 01/04/17 0444  NA 141 140  K 4.3 3.9  CL 103 104  CO2 27 27  GLUCOSE 123* 105*  BUN 43* 41*  CREATININE 2.72* 2.74*  CALCIUM 9.2 9.1   Recent Labs    01/02/17 1425  PTH 111*   Iron Studies: No results for input(s): IRON, TIBC, TRANSFERRIN, FERRITIN in the last 72 hours.  Studies/Results: No results found.  I have reviewed the patient's current medications.  Assessment/Plan: 1 Renal TX Cr stable, diuresing, will cont current dose Lasix. Ok to d/c, will check labs on Mon and see Dr. Moshe Cipro in 10d 2 HTN lower with lower vl 3 Obesity 4 DM controlled 5 Fungal hand infx on meds P Lasix, ok to d/c, f/u chem Mon    LOS: 2 days   Jeneen Rinks Wyley Hack 01/04/2017,10:09 AM

## 2017-01-06 LAB — TACROLIMUS LEVEL: TACROLIMUS (FK506) - LABCORP: 7.8 ng/mL (ref 2.0–20.0)

## 2017-01-09 ENCOUNTER — Ambulatory Visit: Payer: Medicare Other | Admitting: Neurology

## 2017-01-09 DIAGNOSIS — Z4822 Encounter for aftercare following kidney transplant: Secondary | ICD-10-CM | POA: Diagnosis not present

## 2017-01-09 DIAGNOSIS — Z5181 Encounter for therapeutic drug level monitoring: Secondary | ICD-10-CM | POA: Diagnosis not present

## 2017-01-09 DIAGNOSIS — Z940 Kidney transplant status: Secondary | ICD-10-CM | POA: Diagnosis not present

## 2017-01-09 DIAGNOSIS — Z79899 Other long term (current) drug therapy: Secondary | ICD-10-CM | POA: Diagnosis not present

## 2017-01-11 DIAGNOSIS — B349 Viral infection, unspecified: Secondary | ICD-10-CM | POA: Diagnosis not present

## 2017-01-11 DIAGNOSIS — Z792 Long term (current) use of antibiotics: Secondary | ICD-10-CM | POA: Diagnosis not present

## 2017-01-11 DIAGNOSIS — B9789 Other viral agents as the cause of diseases classified elsewhere: Secondary | ICD-10-CM | POA: Diagnosis not present

## 2017-01-11 DIAGNOSIS — Z7952 Long term (current) use of systemic steroids: Secondary | ICD-10-CM | POA: Diagnosis not present

## 2017-01-11 DIAGNOSIS — B352 Tinea manuum: Secondary | ICD-10-CM | POA: Diagnosis not present

## 2017-01-11 DIAGNOSIS — I10 Essential (primary) hypertension: Secondary | ICD-10-CM | POA: Diagnosis not present

## 2017-01-11 DIAGNOSIS — G4733 Obstructive sleep apnea (adult) (pediatric): Secondary | ICD-10-CM | POA: Diagnosis not present

## 2017-01-11 DIAGNOSIS — Z5181 Encounter for therapeutic drug level monitoring: Secondary | ICD-10-CM | POA: Diagnosis not present

## 2017-01-11 DIAGNOSIS — Z79899 Other long term (current) drug therapy: Secondary | ICD-10-CM | POA: Diagnosis not present

## 2017-01-11 DIAGNOSIS — Z940 Kidney transplant status: Secondary | ICD-10-CM | POA: Diagnosis not present

## 2017-01-11 DIAGNOSIS — E119 Type 2 diabetes mellitus without complications: Secondary | ICD-10-CM | POA: Diagnosis not present

## 2017-01-11 DIAGNOSIS — Z8619 Personal history of other infectious and parasitic diseases: Secondary | ICD-10-CM | POA: Diagnosis not present

## 2017-01-11 DIAGNOSIS — Z794 Long term (current) use of insulin: Secondary | ICD-10-CM | POA: Diagnosis not present

## 2017-01-11 DIAGNOSIS — R600 Localized edema: Secondary | ICD-10-CM | POA: Diagnosis not present

## 2017-01-11 DIAGNOSIS — Z4822 Encounter for aftercare following kidney transplant: Secondary | ICD-10-CM | POA: Diagnosis not present

## 2017-01-12 DIAGNOSIS — M25571 Pain in right ankle and joints of right foot: Secondary | ICD-10-CM | POA: Diagnosis not present

## 2017-01-12 DIAGNOSIS — M71371 Other bursal cyst, right ankle and foot: Secondary | ICD-10-CM | POA: Diagnosis not present

## 2017-01-13 DIAGNOSIS — R600 Localized edema: Secondary | ICD-10-CM | POA: Diagnosis not present

## 2017-01-13 DIAGNOSIS — D638 Anemia in other chronic diseases classified elsewhere: Secondary | ICD-10-CM | POA: Diagnosis not present

## 2017-01-13 DIAGNOSIS — I10 Essential (primary) hypertension: Secondary | ICD-10-CM | POA: Diagnosis not present

## 2017-01-13 DIAGNOSIS — N19 Unspecified kidney failure: Secondary | ICD-10-CM | POA: Diagnosis not present

## 2017-01-18 DIAGNOSIS — E119 Type 2 diabetes mellitus without complications: Secondary | ICD-10-CM | POA: Diagnosis not present

## 2017-01-18 DIAGNOSIS — Z940 Kidney transplant status: Secondary | ICD-10-CM | POA: Diagnosis not present

## 2017-01-18 DIAGNOSIS — Z7952 Long term (current) use of systemic steroids: Secondary | ICD-10-CM | POA: Diagnosis not present

## 2017-01-18 DIAGNOSIS — I10 Essential (primary) hypertension: Secondary | ICD-10-CM | POA: Diagnosis not present

## 2017-01-18 DIAGNOSIS — B349 Viral infection, unspecified: Secondary | ICD-10-CM | POA: Diagnosis not present

## 2017-01-18 DIAGNOSIS — Z794 Long term (current) use of insulin: Secondary | ICD-10-CM | POA: Diagnosis not present

## 2017-01-18 DIAGNOSIS — T8699 Other complications of unspecified transplanted organ and tissue: Secondary | ICD-10-CM | POA: Diagnosis not present

## 2017-01-18 DIAGNOSIS — Z79899 Other long term (current) drug therapy: Secondary | ICD-10-CM | POA: Diagnosis not present

## 2017-01-18 DIAGNOSIS — Z5181 Encounter for therapeutic drug level monitoring: Secondary | ICD-10-CM | POA: Diagnosis not present

## 2017-01-18 DIAGNOSIS — G4733 Obstructive sleep apnea (adult) (pediatric): Secondary | ICD-10-CM | POA: Diagnosis not present

## 2017-01-18 DIAGNOSIS — R600 Localized edema: Secondary | ICD-10-CM | POA: Diagnosis not present

## 2017-01-18 DIAGNOSIS — Z792 Long term (current) use of antibiotics: Secondary | ICD-10-CM | POA: Diagnosis not present

## 2017-01-18 DIAGNOSIS — Z4822 Encounter for aftercare following kidney transplant: Secondary | ICD-10-CM | POA: Diagnosis not present

## 2017-01-18 DIAGNOSIS — L089 Local infection of the skin and subcutaneous tissue, unspecified: Secondary | ICD-10-CM | POA: Diagnosis not present

## 2017-01-19 DIAGNOSIS — D509 Iron deficiency anemia, unspecified: Secondary | ICD-10-CM | POA: Diagnosis not present

## 2017-01-19 DIAGNOSIS — E11319 Type 2 diabetes mellitus with unspecified diabetic retinopathy without macular edema: Secondary | ICD-10-CM | POA: Diagnosis not present

## 2017-01-19 DIAGNOSIS — H353120 Nonexudative age-related macular degeneration, left eye, stage unspecified: Secondary | ICD-10-CM | POA: Diagnosis not present

## 2017-01-23 DIAGNOSIS — I272 Pulmonary hypertension, unspecified: Secondary | ICD-10-CM

## 2017-01-23 HISTORY — DX: Pulmonary hypertension, unspecified: I27.20

## 2017-01-25 DIAGNOSIS — Z940 Kidney transplant status: Secondary | ICD-10-CM | POA: Diagnosis not present

## 2017-01-25 DIAGNOSIS — Z4822 Encounter for aftercare following kidney transplant: Secondary | ICD-10-CM | POA: Diagnosis not present

## 2017-01-25 DIAGNOSIS — M961 Postlaminectomy syndrome, not elsewhere classified: Secondary | ICD-10-CM | POA: Diagnosis not present

## 2017-01-25 DIAGNOSIS — Z5181 Encounter for therapeutic drug level monitoring: Secondary | ICD-10-CM | POA: Diagnosis not present

## 2017-01-25 DIAGNOSIS — Z79899 Other long term (current) drug therapy: Secondary | ICD-10-CM | POA: Diagnosis not present

## 2017-01-25 DIAGNOSIS — M545 Low back pain: Secondary | ICD-10-CM | POA: Diagnosis not present

## 2017-01-26 DIAGNOSIS — R809 Proteinuria, unspecified: Secondary | ICD-10-CM | POA: Diagnosis not present

## 2017-01-26 DIAGNOSIS — I129 Hypertensive chronic kidney disease with stage 1 through stage 4 chronic kidney disease, or unspecified chronic kidney disease: Secondary | ICD-10-CM | POA: Diagnosis not present

## 2017-01-26 DIAGNOSIS — N184 Chronic kidney disease, stage 4 (severe): Secondary | ICD-10-CM | POA: Diagnosis not present

## 2017-01-26 DIAGNOSIS — Z940 Kidney transplant status: Secondary | ICD-10-CM | POA: Diagnosis not present

## 2017-01-26 DIAGNOSIS — E1122 Type 2 diabetes mellitus with diabetic chronic kidney disease: Secondary | ICD-10-CM | POA: Diagnosis not present

## 2017-02-01 DIAGNOSIS — I10 Essential (primary) hypertension: Secondary | ICD-10-CM | POA: Diagnosis not present

## 2017-02-01 DIAGNOSIS — T8699 Other complications of unspecified transplanted organ and tissue: Secondary | ICD-10-CM | POA: Diagnosis not present

## 2017-02-01 DIAGNOSIS — Z792 Long term (current) use of antibiotics: Secondary | ICD-10-CM | POA: Diagnosis not present

## 2017-02-01 DIAGNOSIS — E119 Type 2 diabetes mellitus without complications: Secondary | ICD-10-CM | POA: Diagnosis not present

## 2017-02-01 DIAGNOSIS — Z79899 Other long term (current) drug therapy: Secondary | ICD-10-CM | POA: Diagnosis not present

## 2017-02-01 DIAGNOSIS — Z794 Long term (current) use of insulin: Secondary | ICD-10-CM | POA: Diagnosis not present

## 2017-02-01 DIAGNOSIS — R600 Localized edema: Secondary | ICD-10-CM | POA: Diagnosis not present

## 2017-02-01 DIAGNOSIS — B349 Viral infection, unspecified: Secondary | ICD-10-CM | POA: Diagnosis not present

## 2017-02-01 DIAGNOSIS — I272 Pulmonary hypertension, unspecified: Secondary | ICD-10-CM | POA: Diagnosis not present

## 2017-02-01 DIAGNOSIS — Z5181 Encounter for therapeutic drug level monitoring: Secondary | ICD-10-CM | POA: Diagnosis not present

## 2017-02-01 DIAGNOSIS — R7989 Other specified abnormal findings of blood chemistry: Secondary | ICD-10-CM | POA: Diagnosis not present

## 2017-02-01 DIAGNOSIS — G4733 Obstructive sleep apnea (adult) (pediatric): Secondary | ICD-10-CM | POA: Diagnosis not present

## 2017-02-01 DIAGNOSIS — Z4822 Encounter for aftercare following kidney transplant: Secondary | ICD-10-CM | POA: Diagnosis not present

## 2017-02-01 DIAGNOSIS — Z7952 Long term (current) use of systemic steroids: Secondary | ICD-10-CM | POA: Diagnosis not present

## 2017-02-08 DIAGNOSIS — Z5181 Encounter for therapeutic drug level monitoring: Secondary | ICD-10-CM | POA: Diagnosis not present

## 2017-02-08 DIAGNOSIS — Z79899 Other long term (current) drug therapy: Secondary | ICD-10-CM | POA: Diagnosis not present

## 2017-02-08 DIAGNOSIS — Z940 Kidney transplant status: Secondary | ICD-10-CM | POA: Diagnosis not present

## 2017-02-08 DIAGNOSIS — Z4822 Encounter for aftercare following kidney transplant: Secondary | ICD-10-CM | POA: Diagnosis not present

## 2017-02-13 ENCOUNTER — Encounter: Payer: Self-pay | Admitting: Infectious Diseases

## 2017-02-13 ENCOUNTER — Ambulatory Visit (INDEPENDENT_AMBULATORY_CARE_PROVIDER_SITE_OTHER): Payer: Medicare Other | Admitting: Infectious Diseases

## 2017-02-13 DIAGNOSIS — L089 Local infection of the skin and subcutaneous tissue, unspecified: Secondary | ICD-10-CM | POA: Diagnosis not present

## 2017-02-13 DIAGNOSIS — Z5181 Encounter for therapeutic drug level monitoring: Secondary | ICD-10-CM

## 2017-02-13 DIAGNOSIS — H539 Unspecified visual disturbance: Secondary | ICD-10-CM | POA: Diagnosis not present

## 2017-02-13 NOTE — Patient Instructions (Signed)
Your finger is looking great and healing well!   Please come back in 2 months to see Colletta Maryland again to see how you are doing off your medications.   Please call if you notice any swelling or color changes to the skin in that area prior to your last visit with [place].   [phone] - nurse triage line.

## 2017-02-13 NOTE — Assessment & Plan Note (Signed)
Resolved after D/C'ing voriconazole

## 2017-02-13 NOTE — Assessment & Plan Note (Signed)
Jon [name] has completed 10 weeks of voriconazole and his finger looks excellent today. I am confident that he has had adequate treatment considering the infection was well localized from Dr. Bertis Ruddy report and did not involve the underlying bone/joint at all. He will continue off voriconazole and return in 2 months to follow up and ensure clinical cure.

## 2017-02-13 NOTE — Progress Notes (Signed)
Jon [name] Jun 08, 1950 [id number] PCP: [name], MD  Referring Provider. Dr. Marzetta Merino   Reason for Visit: Fungal finger infection  Brief Narrative: Jon [name] is a 67 y.o. male with significant history of diabetes, kidney transplant complicated by early low-level BK viremia now on lifelong prednisone/prograf/mycophenolate and PJP prophylaxis. He was found to have fungal infection of his left middle finger following pathology report after left finger mass excision was performed by Dr. Marzetta Merino on 11/09/16. 2016 he was working with treated wood when he had a long splinter get lodged into his left middle finger. A few weeks after it healed a mobile/painless nodule emerged and 6 months after that a second nodule. This was drained in a doctor's office revealing dark almost black material with a strong odor. Unfortunately the area would re-accumulate within 24 hours. XRays pre op from what the patient tells me showed only superficial involvement and no compromise to the underlying bone or joint. Jon [name] also denies having had any pain or altered ROM of the finger. No fevers, malaise, unintentional weight loss or other signs of systemic illness. I started him on empiric Voriconazole 200 mg BID 11/26/16. I contacted his transplant center to alert them with drug to drug interaction and request coordination for more frequent monitoring of levels.   HPI:  Jon [name] returns again today with his wife for follow up on his finger infection. He has been feeling very well and is out and about working on his tractor in the woods again. He has a lot of abrasions/lacerations to the right arm due to contact with a barbed wire fence recently but they are healing well. Had ophthalmology exam in January and got a good report and advised to return in 1 year. He did go to the ED in December for some chest pain d/t increased fluid. He has had no further problems with this since his diuretics have been  adjusted. Jon [name] also went to his podiatrist to have the spot on his right ankle drained again and is has not returned and he attributes this to the Voriconazole. He denies any fevers or chills and finger is healing well. He has full ROM and no swelling to his finger and wrist is weak but coming along nicely now. He has been off Voriconazole x 2 weeks and his prograf doses have been titrated back up again.   Patient Active Problem List   Diagnosis Date Noted  . Acute on chronic diastolic CHF (congestive heart failure) ([place]) 01/04/2017  . Chest pain 01/02/2017  . Peripheral edema 01/02/2017  . Anasarca 01/02/2017  . Finger infection, fungal 11/22/2016  . Dyspnea on exertion 08/24/2016  . Embolic stroke ([place]) 79/15/0569  . HLD (hyperlipidemia) 05/02/2013  . OSA (obstructive sleep apnea) 05/02/2013  . Medication monitoring encounter 03/22/2013  . History of stroke 03/16/2013  . Movement disorder 03/16/2013  . Lumbar stenosis with neurogenic claudication 12/27/2012  . Chronic back pain 01/27/2011  . Tobacco use disorder in remission 01/27/2011  . HTN (hypertension) 01/27/2011  . History of kidney transplant 01/24/2011  . Hyperkalemia 01/23/2011  . Diabetes mellitus ([place]) 01/23/2011  . Lower back pain 01/23/2011   Review of Systems  Constitutional: Negative for chills, diaphoresis, fever and weight loss.  HENT: Negative for tinnitus.   Eyes: Negative for blurred vision, double vision and photophobia.  Respiratory: Negative for cough and sputum production.   Cardiovascular: Positive for leg swelling. Negative for chest pain and orthopnea.  Gastrointestinal:  Negative for diarrhea and vomiting.  Genitourinary: Negative for dysuria.  Musculoskeletal: Negative for myalgias.  Skin: Negative for rash.  Neurological: Negative for dizziness and headaches.  Endo/Heme/Allergies: Negative for polydipsia. Bruises/bleeds easily.    Past Medical History:  Diagnosis Date  . Arthritis    back,  hands   . Chronic back pain    radiculopathy and stenosis  . Chronic kidney disease    [name] Transplant   . CVA (cerebral infarction) 03/2013  . Diabetes mellitus     Lantus nightly ;type 2  . GERD (gastroesophageal reflux disease)   . H/O hiatal hernia   . History of colon polyps   . Hx of cardiovascular stress test    a.  Lexiscan Myoview (05/2013):  No ischemia, EF 53%; Normal Study  . Hyperlipidemia    takes Lovastatin daily  . Hypertension    takes Amlodipine and Catapres daily    dr Forrestine Him, Loop [facility] - [facility]  . Nocturia   . Peripheral edema    takes Lasix daily  . Sleep apnea    cpap , study in their home, 09/2102- [facility]           . Stroke ([facility])    speech, rt arm weakness  . Urinary frequency    Outpatient Medications Prior to Visit  Medication Sig Dispense Refill  . aspirin EC 81 MG tablet Take 81 mg by mouth at bedtime.    [name] atorvastatin (LIPITOR) 40 MG tablet Take 40 mg by mouth at bedtime.     . Blood Glucose Monitoring Suppl (ONE TOUCH ULTRA 2) w/Device KIT     . calcitRIOL (ROCALTROL) 0.25 MCG capsule Take 0.25 mcg by mouth See admin instructions. Take 1 capsule (0.25 mcg) by mouth five times daily - Monday thru Friday    . furosemide (LASIX) 80 MG tablet Take 2 tablets (160 mg total) by mouth 3 (three) times daily. 90 tablet 0  . gabapentin (NEURONTIN) 300 MG capsule Take 300 mg by mouth 2 (two) times daily.    [name] HYDROcodone-acetaminophen (NORCO) 10-325 MG tablet Take 1-2 tablets by mouth every 6 (six) hours as needed (pain).     . insulin glargine (LANTUS) 100 unit/mL SOPN Inject 48 Units into the skin at bedtime.    . insulin lispro (HUMALOG KWIKPEN) 100 UNIT/ML KiwkPen Inject 5-15 Units into the skin See admin instructions. Inject 5 units subcutaneously three times daily before meals adjusted per carb count - add 2 units for every 50 carbs to be consumed    . labetalol (NORMODYNE) 300 MG tablet Take 300 mg by mouth 3 (three) times  daily.     . magnesium oxide (MAG-OX) 400 MG tablet Take 400 mg by mouth daily.    . Multiple Vitamin (MULTIVITAMIN WITH MINERALS) TABS tablet Take 1 tablet by mouth daily.    . mycophenolate (MYFORTIC) 180 MG EC tablet Take 180 mg by mouth 2 (two) times daily.    [name] omeprazole (PRILOSEC) 20 MG capsule Take 20 mg by mouth daily.    . Potassium Citrate 15 MEQ (1620 MG) TBCR Take 1 tablet by mouth 3 (three) times daily.     . predniSONE (DELTASONE) 5 MG tablet Take 5 mg by mouth daily with breakfast.    . sulfamethoxazole-trimethoprim (BACTRIM,SEPTRA) 400-80 MG tablet Take 1 tablet by mouth every Monday, Wednesday, and Friday.     . tacrolimus (PROGRAF) 0.5 MG capsule Take 0.5 mg by mouth 2 (two) times daily.    [name]  tamsulosin (FLOMAX) 0.4 MG CAPS capsule Take 0.4 mg by mouth daily after breakfast.    . vitamin B-12 (CYANOCOBALAMIN) 1000 MCG tablet Take 1,000 mcg by mouth 2 (two) times daily.     . voriconazole (VFEND) 200 MG tablet Take 1 tablet (200 mg total) 2 (two) times daily by mouth. (Patient not taking: Reported on 02/13/2017) 60 tablet 2   No facility-administered medications prior to visit.    Allergies  Allergen Reactions  . Lisinopril Other (See Comments)    Increased potassium level   . Meloxicam Nausea And Vomiting  . Victoza [Liraglutide] Diarrhea, Nausea And Vomiting and Swelling   Social History   Tobacco Use  . Smoking status: Former Smoker    Packs/day: 0.50    Years: 40.00    Pack years: 20.00    Types: Cigarettes    Last attempt to quit: 12/27/2012    Years since quitting: 4.1  . Smokeless tobacco: Never Used  Substance Use Topics  . Alcohol use: No    Alcohol/week: 0.0 oz  . Drug use: No   Family History  Problem Relation Age of Onset  . Hypertension Mother   . Hyperlipidemia Father   . Hypertension Father   . Heart disease Father        before age 60  . Renal Disease Father   . Diabetes Brother   . Hyperlipidemia Son     OBJECTIVE: Vitals:    02/13/17 1047  BP: 132/73  Pulse: 61  Temp: (!) 97.4 F (36.3 C)  TempSrc: Oral  Weight: 250 lb (113.4 kg)  Height: 5' 11" (1.803 m)   Body mass index is 34.87 kg/m.  Physical Exam  Constitutional: He is oriented to person, place, and time and well-developed, well-nourished, and in no distress.  Appears well today. Accompanied by his wife.   HENT:  Mouth/Throat: Oropharynx is clear and moist. No oral lesions. Normal dentition. No dental caries.  Eyes: Pupils are equal, round, and reactive to light. No scleral icterus.  Cardiovascular: Normal rate, regular rhythm and normal heart sounds.  Pulmonary/Chest: Effort normal and breath sounds normal. No respiratory distress.  Abdominal: Soft. He exhibits no distension. There is no tenderness.  Musculoskeletal: Normal range of motion.  Left #3 finger with small white scar. No erythema, swelling, nodule or drainage noted. No necrotic tissue noted.   Lymphadenopathy:    He has no cervical adenopathy.  Neurological: He is alert and oriented to person, place, and time.  Skin: Skin is warm and dry. No rash noted.  Psychiatric: Mood and affect normal.      PATHOLOGY REPORT FROM 11/09/2016: CYSTIC FIBROHISTIOCYTIC GRANULOMATOUS INFLAMMATION AND FOREIGN BODY REACTION IN A BACKGROUND OF CHRONIC ACTIVE TENOSYNOVITIS.  - GMS AND PAS-F STAINS POSITIVE FOR FUNGAL ORGANISMS - AFB STAIN NEGATIVE  - NEGATIVE FOR MALIGNANCY  - FRAGMENTS OF REFRACTILE FOREIGN MATERIAL IDENTIFIED UNDER POLARIZED LIGHT  ASSESSMENT & PLAN:  Problem List Items Addressed This Visit      Other   Finger infection, fungal    Jon [name] has completed 10 weeks of voriconazole and his finger looks excellent today. I am confident that he has had adequate treatment considering the infection was well localized from Dr. Weingold's report and did not involve the underlying bone/joint at all. He will continue off voriconazole and return in 2 months to follow up and ensure clinical cure.         Medication monitoring encounter    Previous Voriconazole level just slightly under therapeutic however   with kidney function did not push dose. He has had ongoing management with Transplant center regarding prograf levels and now back to previous doses/ranges. ED EKG reviewed and QTc 428 ms - no prolongation effect from voriconazole.       RESOLVED: Vision changes    Resolved after D/C'ing voriconazole           , MSN, FNP-C [facility] 02/13/2017  11:27 AM   

## 2017-02-13 NOTE — Assessment & Plan Note (Addendum)
Previous Voriconazole level just slightly under therapeutic however with kidney function did not push dose. He has had ongoing management with Transplant center regarding prograf levels and now back to previous doses/ranges. ED EKG reviewed and QTc 428 ms - no prolongation effect from voriconazole.

## 2017-02-15 DIAGNOSIS — Z5181 Encounter for therapeutic drug level monitoring: Secondary | ICD-10-CM | POA: Diagnosis not present

## 2017-02-15 DIAGNOSIS — R600 Localized edema: Secondary | ICD-10-CM | POA: Diagnosis not present

## 2017-02-15 DIAGNOSIS — I10 Essential (primary) hypertension: Secondary | ICD-10-CM | POA: Diagnosis not present

## 2017-02-15 DIAGNOSIS — E8770 Fluid overload, unspecified: Secondary | ICD-10-CM | POA: Diagnosis not present

## 2017-02-15 DIAGNOSIS — B349 Viral infection, unspecified: Secondary | ICD-10-CM | POA: Diagnosis not present

## 2017-02-15 DIAGNOSIS — Z794 Long term (current) use of insulin: Secondary | ICD-10-CM | POA: Diagnosis not present

## 2017-02-15 DIAGNOSIS — Z4822 Encounter for aftercare following kidney transplant: Secondary | ICD-10-CM | POA: Diagnosis not present

## 2017-02-15 DIAGNOSIS — G4733 Obstructive sleep apnea (adult) (pediatric): Secondary | ICD-10-CM | POA: Diagnosis not present

## 2017-02-15 DIAGNOSIS — E119 Type 2 diabetes mellitus without complications: Secondary | ICD-10-CM | POA: Diagnosis not present

## 2017-02-15 DIAGNOSIS — Z7952 Long term (current) use of systemic steroids: Secondary | ICD-10-CM | POA: Diagnosis not present

## 2017-02-15 DIAGNOSIS — Z792 Long term (current) use of antibiotics: Secondary | ICD-10-CM | POA: Diagnosis not present

## 2017-02-15 DIAGNOSIS — Z79899 Other long term (current) drug therapy: Secondary | ICD-10-CM | POA: Diagnosis not present

## 2017-02-15 DIAGNOSIS — Z940 Kidney transplant status: Secondary | ICD-10-CM | POA: Diagnosis not present

## 2017-02-22 DIAGNOSIS — Z4822 Encounter for aftercare following kidney transplant: Secondary | ICD-10-CM | POA: Diagnosis not present

## 2017-02-22 DIAGNOSIS — Z940 Kidney transplant status: Secondary | ICD-10-CM | POA: Diagnosis not present

## 2017-02-22 DIAGNOSIS — Z79899 Other long term (current) drug therapy: Secondary | ICD-10-CM | POA: Diagnosis not present

## 2017-02-22 DIAGNOSIS — Z5181 Encounter for therapeutic drug level monitoring: Secondary | ICD-10-CM | POA: Diagnosis not present

## 2017-03-01 DIAGNOSIS — Z7952 Long term (current) use of systemic steroids: Secondary | ICD-10-CM | POA: Diagnosis not present

## 2017-03-01 DIAGNOSIS — E119 Type 2 diabetes mellitus without complications: Secondary | ICD-10-CM | POA: Diagnosis not present

## 2017-03-01 DIAGNOSIS — I10 Essential (primary) hypertension: Secondary | ICD-10-CM | POA: Diagnosis not present

## 2017-03-01 DIAGNOSIS — Z792 Long term (current) use of antibiotics: Secondary | ICD-10-CM | POA: Diagnosis not present

## 2017-03-01 DIAGNOSIS — E8770 Fluid overload, unspecified: Secondary | ICD-10-CM | POA: Diagnosis not present

## 2017-03-01 DIAGNOSIS — Z5181 Encounter for therapeutic drug level monitoring: Secondary | ICD-10-CM | POA: Diagnosis not present

## 2017-03-01 DIAGNOSIS — Z79899 Other long term (current) drug therapy: Secondary | ICD-10-CM | POA: Diagnosis not present

## 2017-03-01 DIAGNOSIS — Z940 Kidney transplant status: Secondary | ICD-10-CM | POA: Diagnosis not present

## 2017-03-01 DIAGNOSIS — Z794 Long term (current) use of insulin: Secondary | ICD-10-CM | POA: Diagnosis not present

## 2017-03-01 DIAGNOSIS — Z4822 Encounter for aftercare following kidney transplant: Secondary | ICD-10-CM | POA: Diagnosis not present

## 2017-03-01 DIAGNOSIS — G4733 Obstructive sleep apnea (adult) (pediatric): Secondary | ICD-10-CM | POA: Diagnosis not present

## 2017-03-01 DIAGNOSIS — J449 Chronic obstructive pulmonary disease, unspecified: Secondary | ICD-10-CM | POA: Diagnosis not present

## 2017-03-01 DIAGNOSIS — R600 Localized edema: Secondary | ICD-10-CM | POA: Diagnosis not present

## 2017-03-05 DIAGNOSIS — G4733 Obstructive sleep apnea (adult) (pediatric): Secondary | ICD-10-CM | POA: Diagnosis not present

## 2017-03-06 DIAGNOSIS — E119 Type 2 diabetes mellitus without complications: Secondary | ICD-10-CM | POA: Diagnosis not present

## 2017-03-13 DIAGNOSIS — E7800 Pure hypercholesterolemia, unspecified: Secondary | ICD-10-CM | POA: Diagnosis not present

## 2017-03-13 DIAGNOSIS — I10 Essential (primary) hypertension: Secondary | ICD-10-CM | POA: Diagnosis not present

## 2017-03-13 DIAGNOSIS — E1121 Type 2 diabetes mellitus with diabetic nephropathy: Secondary | ICD-10-CM | POA: Diagnosis not present

## 2017-03-15 DIAGNOSIS — Z940 Kidney transplant status: Secondary | ICD-10-CM | POA: Diagnosis not present

## 2017-03-20 DIAGNOSIS — M67442 Ganglion, left hand: Secondary | ICD-10-CM | POA: Diagnosis not present

## 2017-03-20 DIAGNOSIS — R2232 Localized swelling, mass and lump, left upper limb: Secondary | ICD-10-CM | POA: Diagnosis not present

## 2017-03-20 DIAGNOSIS — M25332 Other instability, left wrist: Secondary | ICD-10-CM | POA: Diagnosis not present

## 2017-03-22 DIAGNOSIS — Z940 Kidney transplant status: Secondary | ICD-10-CM | POA: Diagnosis not present

## 2017-03-29 DIAGNOSIS — N2581 Secondary hyperparathyroidism of renal origin: Secondary | ICD-10-CM | POA: Diagnosis not present

## 2017-03-29 DIAGNOSIS — R809 Proteinuria, unspecified: Secondary | ICD-10-CM | POA: Diagnosis not present

## 2017-03-29 DIAGNOSIS — Z940 Kidney transplant status: Secondary | ICD-10-CM | POA: Diagnosis not present

## 2017-03-29 DIAGNOSIS — I129 Hypertensive chronic kidney disease with stage 1 through stage 4 chronic kidney disease, or unspecified chronic kidney disease: Secondary | ICD-10-CM | POA: Diagnosis not present

## 2017-03-29 DIAGNOSIS — E1122 Type 2 diabetes mellitus with diabetic chronic kidney disease: Secondary | ICD-10-CM | POA: Diagnosis not present

## 2017-04-05 DIAGNOSIS — Z4822 Encounter for aftercare following kidney transplant: Secondary | ICD-10-CM | POA: Diagnosis not present

## 2017-04-05 DIAGNOSIS — E119 Type 2 diabetes mellitus without complications: Secondary | ICD-10-CM | POA: Diagnosis not present

## 2017-04-05 DIAGNOSIS — Z87891 Personal history of nicotine dependence: Secondary | ICD-10-CM | POA: Diagnosis not present

## 2017-04-05 DIAGNOSIS — Z792 Long term (current) use of antibiotics: Secondary | ICD-10-CM | POA: Diagnosis not present

## 2017-04-05 DIAGNOSIS — Z9989 Dependence on other enabling machines and devices: Secondary | ICD-10-CM | POA: Diagnosis not present

## 2017-04-05 DIAGNOSIS — I272 Pulmonary hypertension, unspecified: Secondary | ICD-10-CM | POA: Diagnosis not present

## 2017-04-05 DIAGNOSIS — I10 Essential (primary) hypertension: Secondary | ICD-10-CM | POA: Diagnosis not present

## 2017-04-05 DIAGNOSIS — Z79899 Other long term (current) drug therapy: Secondary | ICD-10-CM | POA: Diagnosis not present

## 2017-04-05 DIAGNOSIS — Z5181 Encounter for therapeutic drug level monitoring: Secondary | ICD-10-CM | POA: Diagnosis not present

## 2017-04-05 DIAGNOSIS — G4733 Obstructive sleep apnea (adult) (pediatric): Secondary | ICD-10-CM | POA: Diagnosis not present

## 2017-04-05 DIAGNOSIS — R600 Localized edema: Secondary | ICD-10-CM | POA: Diagnosis not present

## 2017-04-05 DIAGNOSIS — B349 Viral infection, unspecified: Secondary | ICD-10-CM | POA: Diagnosis not present

## 2017-04-05 DIAGNOSIS — Z794 Long term (current) use of insulin: Secondary | ICD-10-CM | POA: Diagnosis not present

## 2017-04-05 DIAGNOSIS — Z940 Kidney transplant status: Secondary | ICD-10-CM | POA: Diagnosis not present

## 2017-04-05 DIAGNOSIS — R609 Edema, unspecified: Secondary | ICD-10-CM | POA: Diagnosis not present

## 2017-04-05 DIAGNOSIS — Z7952 Long term (current) use of systemic steroids: Secondary | ICD-10-CM | POA: Diagnosis not present

## 2017-04-13 ENCOUNTER — Ambulatory Visit: Payer: Medicare Other | Admitting: Infectious Diseases

## 2017-04-17 DIAGNOSIS — M961 Postlaminectomy syndrome, not elsewhere classified: Secondary | ICD-10-CM | POA: Diagnosis not present

## 2017-04-17 DIAGNOSIS — M545 Low back pain: Secondary | ICD-10-CM | POA: Diagnosis not present

## 2017-04-18 ENCOUNTER — Encounter: Payer: Self-pay | Admitting: Infectious Diseases

## 2017-04-18 ENCOUNTER — Ambulatory Visit (INDEPENDENT_AMBULATORY_CARE_PROVIDER_SITE_OTHER): Payer: Medicare Other | Admitting: Infectious Diseases

## 2017-04-18 VITALS — BP 135/72 | HR 68 | Temp 98.1°F | Ht 71.0 in | Wt 254.0 lb

## 2017-04-18 DIAGNOSIS — L089 Local infection of the skin and subcutaneous tissue, unspecified: Secondary | ICD-10-CM

## 2017-04-18 NOTE — Assessment & Plan Note (Signed)
Shadrack continues to do well now 10 weeks off antifungal medications without concern for return of infection or disseminated infection. I explained to he and his wife today that I am confident we have cured his infection and I am pleased with the outcome. I counseled him to be cautious about further injuries sustained outside in dirty environment as I worry long term about risk for other fungal/bacterial infections considering his immunocompromised state.   He is welcome to follow up with our team as needed for now. Encouraged to inform transplant team of any future events involving infection.

## 2017-04-18 NOTE — Progress Notes (Signed)
Jon [name] 08/15/1950 [id number] PCP: [name], MD  Referring Provider. Dr. Marzetta Merino   Reason for Visit: Fungal finger infection, follow up off antibiotics   Patient Active Problem List   Diagnosis Date Noted  . Peripheral edema 01/02/2017  . Finger infection, fungal 11/22/2016  . Dyspnea on exertion 08/24/2016  . HLD (hyperlipidemia) 05/02/2013  . OSA (obstructive sleep apnea) 05/02/2013  . History of stroke 03/16/2013  . Movement disorder 03/16/2013  . Lumbar stenosis with neurogenic claudication 12/27/2012  . Chronic back pain 01/27/2011  . Tobacco use disorder in remission 01/27/2011  . HTN (hypertension) 01/27/2011  . History of kidney transplant 01/24/2011  . Hyperkalemia 01/23/2011  . Diabetes mellitus ([place]) 01/23/2011   Brief Narrative: Jon [name] is a 67 y.o. male with significant history of diabetes, kidney transplant complicated by early low-level BK viremia now on lifelong prednisone/prograf/mycophenolate and PJP prophylaxis. He was found to have fungal infection of his left middle finger following pathology report after left finger mass excision was performed by Dr. Marzetta Merino on 11/09/16. 2016 he was working with treated wood when he had a long splinter get lodged into his left middle finger. A few weeks after it healed a mobile/painless nodule emerged and 6 months after that a second nodule. This was drained in a doctor's office revealing dark almost black material with a strong odor. Unfortunately the area would re-accumulate within 24 hours. XRays pre op from what the patient tells me showed only superficial involvement and no compromise to the underlying bone or joint. Jon [name] also denies having had any pain or altered ROM of the finger. No fevers, malaise, unintentional weight loss or other signs of systemic illness. I started him on empiric Voriconazole 200 mg BID 11/26/16 and he completed 2.5 months of treatment.   HPI:  Jon [name] returns  again today with his wife for follow up on his finger infection. He continues to feel very well. The place on his left #3 finger has continued to look and function well after stopping antifungal over the last 2 months. The nodule that would accumulate on his right ankle has also resolved after most recent resection and voriconazole; this site has not returned. 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   Review of Systems  Constitutional: Negative for chills, diaphoresis, fever and weight loss.  HENT: Negative for tinnitus.   Eyes: Negative for blurred vision, double vision and photophobia.  Respiratory: Negative for cough and sputum production.   Cardiovascular: Negative for chest pain, orthopnea and leg swelling.  Gastrointestinal: Negative for diarrhea and vomiting.  Genitourinary: Negative for dysuria.  Musculoskeletal: Negative for myalgias.  Skin: Negative for rash.  Neurological: Negative for dizziness and headaches.  Endo/Heme/Allergies: Negative for polydipsia. Bruises/bleeds easily.    Past Medical History:  Diagnosis Date  . Arthritis    back, hands   . Chronic back pain    radiculopathy and stenosis  . Chronic kidney disease    [name] awaiting Transplant   . CVA (cerebral infarction) 03/2013  . Diabetes mellitus     Lantus nightly ;type 2  . GERD (gastroesophageal reflux disease)   . H/O hiatal hernia   . History of colon polyps   . Hx of cardiovascular stress test    a.  Lexiscan Myoview (05/2013):  No ischemia, EF 53%; Normal Study  . Hyperlipidemia    takes Lovastatin daily  . Hypertension    takes  Amlodipine and Catapres daily    dr Forrestine Him, Loop Recorder - [facility]  . Nocturia   . Peripheral edema    takes Lasix daily  . Sleep apnea    cpap , study in their home, 09/2102- [facility]           . Stroke (HCC)    speech, rt arm weakness  . Urinary frequency    Outpatient Medications Prior to Visit  Medication Sig Dispense Refill    . aspirin EC 81 MG tablet Take 81 mg by mouth at bedtime.    [name] atorvastatin (LIPITOR) 40 MG tablet Take 40 mg by mouth at bedtime.     . Blood Glucose Monitoring Suppl (ONE TOUCH ULTRA 2) w/Device KIT     . calcitRIOL (ROCALTROL) 0.25 MCG capsule Take 0.25 mcg by mouth See admin instructions. Take 1 capsule (0.25 mcg) by mouth five times daily - Monday thru Friday    . furosemide (LASIX) 80 MG tablet Take 2 tablets (160 mg total) by mouth 3 (three) times daily. 90 tablet 0  . gabapentin (NEURONTIN) 300 MG capsule Take 300 mg by mouth 2 (two) times daily.    [name] HYDROcodone-acetaminophen (NORCO) 10-325 MG tablet Take 1-2 tablets by mouth every 6 (six) hours as needed (pain).     . insulin glargine (LANTUS) 100 unit/mL SOPN Inject 48 Units into the skin at bedtime.    . insulin lispro (HUMALOG KWIKPEN) 100 UNIT/ML KiwkPen Inject 5-15 Units into the skin See admin instructions. Inject 5 units subcutaneously three times daily before meals adjusted per carb count - add 2 units for every 50 carbs to be consumed    . labetalol (NORMODYNE) 300 MG tablet Take 300 mg by mouth 3 (three) times daily.     . magnesium oxide (MAG-OX) 400 MG tablet Take 400 mg by mouth daily.    . Multiple Vitamin (MULTIVITAMIN WITH MINERALS) TABS tablet Take 1 tablet by mouth daily.    . mycophenolate (MYFORTIC) 180 MG EC tablet Take 180 mg by mouth 2 (two) times daily.    [name] omeprazole (PRILOSEC) 20 MG capsule Take 20 mg by mouth daily.    . Potassium Citrate 15 MEQ (1620 MG) TBCR Take 1 tablet by mouth 3 (three) times daily.     . predniSONE (DELTASONE) 5 MG tablet Take 5 mg by mouth daily with breakfast.    . sulfamethoxazole-trimethoprim (BACTRIM,SEPTRA) 400-80 MG tablet Take 1 tablet by mouth every Monday, Wednesday, and Friday.     . tacrolimus (PROGRAF) 0.5 MG capsule Take 0.5 mg by mouth 2 (two) times daily.    . vitamin B-12 (CYANOCOBALAMIN) 1000 MCG tablet Take 1,000 mcg by mouth 2 (two) times daily.     . tamsulosin  (FLOMAX) 0.4 MG CAPS capsule Take 0.4 mg by mouth daily after breakfast.    . voriconazole (VFEND) 200 MG tablet Take 1 tablet (200 mg total) 2 (two) times daily by mouth. (Patient not taking: Reported on 04/18/2017) 60 tablet 2   No facility-administered medications prior to visit.    Allergies  Allergen Reactions  . Lisinopril Other (See Comments)    Increased potassium level   . Meloxicam Nausea And Vomiting  . Victoza [Liraglutide] Diarrhea, Nausea And Vomiting and Swelling   Social History   Tobacco Use  . Smoking status: Former Smoker    Packs/day: 0.50    Years: 40.00    Pack years: 20.00    Types: Cigarettes    Last attempt to quit:  12/27/2012    Years since quitting: 4.3  . Smokeless tobacco: Never Used  Substance Use Topics  . Alcohol use: No    Alcohol/week: 0.0 oz  . Drug use: No   Family History  Problem Relation Age of Onset  . Hypertension Mother   . Hyperlipidemia Father   . Hypertension Father   . Heart disease Father        before age 36  . Renal Disease Father   . Diabetes Brother   . Hyperlipidemia Son     OBJECTIVE: Vitals:   04/18/17 1435  BP: 135/72  Pulse: 68  Temp: 98.1 F (36.7 C)  TempSrc: Oral  Weight: 254 lb (115.2 kg)  Height: _0  (1.803 m)   Body mass index is 35.43 kg/m.  Physical Exam  Constitutional: He is oriented to person, place, and time and well-developed, well-nourished, and in no distress.  Appears well today. Accompanied by his wife.   HENT:  Mouth/Throat: No oral lesions. Normal dentition. No dental caries.  Cardiovascular: Normal rate and regular rhythm.  Pulmonary/Chest: Effort normal. No respiratory distress.  Musculoskeletal: Normal range of motion.  Left #3 finger with small white scar. No erythema, swelling, nodule or drainage noted. No necrotic tissue.   Neurological: He is alert and oriented to person, place, and time.  Skin: Skin is warm and dry. No rash noted.  Psychiatric: Mood and affect normal.     PATHOLOGY REPORT FROM 11/09/2016: CYSTIC FIBROHISTIOCYTIC GRANULOMATOUS INFLAMMATION AND FOREIGN BODY REACTION IN A BACKGROUND OF CHRONIC ACTIVE TENOSYNOVITIS.  - GMS AND PAS-F STAINS POSITIVE FOR FUNGAL ORGANISMS - AFB STAIN NEGATIVE  - NEGATIVE FOR MALIGNANCY  - FRAGMENTS OF REFRACTILE FOREIGN MATERIAL IDENTIFIED UNDER POLARIZED LIGHT  ASSESSMENT & PLAN:  Problem List Items Addressed This Visit      Other   Finger infection, fungal - Primary    Bryse continues to do well now 10 weeks off antifungal medications without concern for return of infection or disseminated infection. I explained to he and his wife today that I am confident we have cured his infection and I am pleased with the outcome. I counseled him to be cautious about further injuries sustained outside in dirty environment as I worry long term about risk for other fungal/bacterial infections considering his immunocompromised state.   He is welcome to follow up with our team as needed for now. Encouraged to inform transplant team of any future events involving infection.         Janene Madeira, MSN, [facility] 04/18/2017  8:11 PM

## 2017-05-03 DIAGNOSIS — D899 Disorder involving the immune mechanism, unspecified: Secondary | ICD-10-CM | POA: Diagnosis not present

## 2017-05-03 DIAGNOSIS — Z940 Kidney transplant status: Secondary | ICD-10-CM | POA: Diagnosis not present

## 2017-05-03 DIAGNOSIS — Z7952 Long term (current) use of systemic steroids: Secondary | ICD-10-CM | POA: Diagnosis not present

## 2017-05-03 DIAGNOSIS — Z794 Long term (current) use of insulin: Secondary | ICD-10-CM | POA: Diagnosis not present

## 2017-05-03 DIAGNOSIS — Z79899 Other long term (current) drug therapy: Secondary | ICD-10-CM | POA: Diagnosis not present

## 2017-05-03 DIAGNOSIS — J449 Chronic obstructive pulmonary disease, unspecified: Secondary | ICD-10-CM | POA: Diagnosis not present

## 2017-05-03 DIAGNOSIS — R609 Edema, unspecified: Secondary | ICD-10-CM | POA: Diagnosis not present

## 2017-05-03 DIAGNOSIS — Z9989 Dependence on other enabling machines and devices: Secondary | ICD-10-CM | POA: Diagnosis not present

## 2017-05-03 DIAGNOSIS — Z5181 Encounter for therapeutic drug level monitoring: Secondary | ICD-10-CM | POA: Diagnosis not present

## 2017-05-03 DIAGNOSIS — Z792 Long term (current) use of antibiotics: Secondary | ICD-10-CM | POA: Diagnosis not present

## 2017-05-03 DIAGNOSIS — G4733 Obstructive sleep apnea (adult) (pediatric): Secondary | ICD-10-CM | POA: Diagnosis not present

## 2017-05-03 DIAGNOSIS — E119 Type 2 diabetes mellitus without complications: Secondary | ICD-10-CM | POA: Diagnosis not present

## 2017-05-03 DIAGNOSIS — Z4822 Encounter for aftercare following kidney transplant: Secondary | ICD-10-CM | POA: Diagnosis not present

## 2017-05-03 DIAGNOSIS — I10 Essential (primary) hypertension: Secondary | ICD-10-CM | POA: Diagnosis not present

## 2017-05-03 DIAGNOSIS — I272 Pulmonary hypertension, unspecified: Secondary | ICD-10-CM | POA: Diagnosis not present

## 2017-05-10 DIAGNOSIS — Z09 Encounter for follow-up examination after completed treatment for conditions other than malignant neoplasm: Secondary | ICD-10-CM | POA: Diagnosis not present

## 2017-05-10 DIAGNOSIS — Z940 Kidney transplant status: Secondary | ICD-10-CM | POA: Diagnosis not present

## 2017-05-16 DIAGNOSIS — Z940 Kidney transplant status: Secondary | ICD-10-CM | POA: Diagnosis not present

## 2017-05-16 DIAGNOSIS — I129 Hypertensive chronic kidney disease with stage 1 through stage 4 chronic kidney disease, or unspecified chronic kidney disease: Secondary | ICD-10-CM | POA: Diagnosis not present

## 2017-05-19 DIAGNOSIS — N189 Chronic kidney disease, unspecified: Secondary | ICD-10-CM | POA: Diagnosis not present

## 2017-05-19 DIAGNOSIS — D509 Iron deficiency anemia, unspecified: Secondary | ICD-10-CM | POA: Diagnosis not present

## 2017-05-19 DIAGNOSIS — Z940 Kidney transplant status: Secondary | ICD-10-CM | POA: Diagnosis not present

## 2017-05-29 DIAGNOSIS — E1122 Type 2 diabetes mellitus with diabetic chronic kidney disease: Secondary | ICD-10-CM | POA: Diagnosis not present

## 2017-05-29 DIAGNOSIS — R809 Proteinuria, unspecified: Secondary | ICD-10-CM | POA: Diagnosis not present

## 2017-05-29 DIAGNOSIS — I129 Hypertensive chronic kidney disease with stage 1 through stage 4 chronic kidney disease, or unspecified chronic kidney disease: Secondary | ICD-10-CM | POA: Diagnosis not present

## 2017-05-29 DIAGNOSIS — N2581 Secondary hyperparathyroidism of renal origin: Secondary | ICD-10-CM | POA: Diagnosis not present

## 2017-05-29 DIAGNOSIS — Z940 Kidney transplant status: Secondary | ICD-10-CM | POA: Diagnosis not present

## 2017-05-31 DIAGNOSIS — Z7952 Long term (current) use of systemic steroids: Secondary | ICD-10-CM | POA: Diagnosis not present

## 2017-05-31 DIAGNOSIS — Z792 Long term (current) use of antibiotics: Secondary | ICD-10-CM | POA: Diagnosis not present

## 2017-05-31 DIAGNOSIS — Z79899 Other long term (current) drug therapy: Secondary | ICD-10-CM | POA: Diagnosis not present

## 2017-05-31 DIAGNOSIS — Z4822 Encounter for aftercare following kidney transplant: Secondary | ICD-10-CM | POA: Diagnosis not present

## 2017-05-31 DIAGNOSIS — Z5181 Encounter for therapeutic drug level monitoring: Secondary | ICD-10-CM | POA: Diagnosis not present

## 2017-06-04 ENCOUNTER — Emergency Department (HOSPITAL_COMMUNITY): Payer: Medicare Other

## 2017-06-04 ENCOUNTER — Other Ambulatory Visit: Payer: Self-pay

## 2017-06-04 ENCOUNTER — Encounter (HOSPITAL_COMMUNITY): Payer: Self-pay | Admitting: Emergency Medicine

## 2017-06-04 ENCOUNTER — Observation Stay (HOSPITAL_COMMUNITY)
Admission: EM | Admit: 2017-06-04 | Discharge: 2017-06-06 | Disposition: A | Discharge status: Home / Self Care | Payer: Medicare Other | Attending: Internal Medicine | Admitting: Internal Medicine

## 2017-06-04 DIAGNOSIS — Z7952 Long term (current) use of systemic steroids: Secondary | ICD-10-CM | POA: Diagnosis not present

## 2017-06-04 DIAGNOSIS — K449 Diaphragmatic hernia without obstruction or gangrene: Secondary | ICD-10-CM | POA: Diagnosis not present

## 2017-06-04 DIAGNOSIS — R079 Chest pain, unspecified: Secondary | ICD-10-CM | POA: Diagnosis not present

## 2017-06-04 DIAGNOSIS — M549 Dorsalgia, unspecified: Secondary | ICD-10-CM | POA: Diagnosis present

## 2017-06-04 DIAGNOSIS — N184 Chronic kidney disease, stage 4 (severe): Secondary | ICD-10-CM | POA: Diagnosis not present

## 2017-06-04 DIAGNOSIS — E860 Dehydration: Secondary | ICD-10-CM | POA: Insufficient documentation

## 2017-06-04 DIAGNOSIS — E1122 Type 2 diabetes mellitus with diabetic chronic kidney disease: Secondary | ICD-10-CM | POA: Diagnosis not present

## 2017-06-04 DIAGNOSIS — Z940 Kidney transplant status: Secondary | ICD-10-CM | POA: Diagnosis not present

## 2017-06-04 DIAGNOSIS — Z886 Allergy status to analgesic agent status: Secondary | ICD-10-CM | POA: Insufficient documentation

## 2017-06-04 DIAGNOSIS — E869 Volume depletion, unspecified: Secondary | ICD-10-CM | POA: Diagnosis not present

## 2017-06-04 DIAGNOSIS — N185 Chronic kidney disease, stage 5: Secondary | ICD-10-CM | POA: Insufficient documentation

## 2017-06-04 DIAGNOSIS — G8929 Other chronic pain: Secondary | ICD-10-CM | POA: Diagnosis not present

## 2017-06-04 DIAGNOSIS — I5032 Chronic diastolic (congestive) heart failure: Secondary | ICD-10-CM | POA: Insufficient documentation

## 2017-06-04 DIAGNOSIS — R531 Weakness: Secondary | ICD-10-CM | POA: Insufficient documentation

## 2017-06-04 DIAGNOSIS — Z79899 Other long term (current) drug therapy: Secondary | ICD-10-CM | POA: Diagnosis not present

## 2017-06-04 DIAGNOSIS — N401 Enlarged prostate with lower urinary tract symptoms: Secondary | ICD-10-CM | POA: Insufficient documentation

## 2017-06-04 DIAGNOSIS — R339 Retention of urine, unspecified: Secondary | ICD-10-CM | POA: Diagnosis not present

## 2017-06-04 DIAGNOSIS — Z8673 Personal history of transient ischemic attack (TIA), and cerebral infarction without residual deficits: Secondary | ICD-10-CM | POA: Diagnosis not present

## 2017-06-04 DIAGNOSIS — I132 Hypertensive heart and chronic kidney disease with heart failure and with stage 5 chronic kidney disease, or end stage renal disease: Secondary | ICD-10-CM | POA: Diagnosis not present

## 2017-06-04 DIAGNOSIS — Z86010 Personal history of colonic polyps: Secondary | ICD-10-CM | POA: Diagnosis not present

## 2017-06-04 DIAGNOSIS — K219 Gastro-esophageal reflux disease without esophagitis: Secondary | ICD-10-CM | POA: Insufficient documentation

## 2017-06-04 DIAGNOSIS — Z7982 Long term (current) use of aspirin: Secondary | ICD-10-CM | POA: Insufficient documentation

## 2017-06-04 DIAGNOSIS — J449 Chronic obstructive pulmonary disease, unspecified: Secondary | ICD-10-CM | POA: Diagnosis not present

## 2017-06-04 DIAGNOSIS — M48062 Spinal stenosis, lumbar region with neurogenic claudication: Secondary | ICD-10-CM | POA: Insufficient documentation

## 2017-06-04 DIAGNOSIS — Z794 Long term (current) use of insulin: Secondary | ICD-10-CM | POA: Diagnosis not present

## 2017-06-04 DIAGNOSIS — Z87891 Personal history of nicotine dependence: Secondary | ICD-10-CM | POA: Insufficient documentation

## 2017-06-04 DIAGNOSIS — E119 Type 2 diabetes mellitus without complications: Secondary | ICD-10-CM

## 2017-06-04 DIAGNOSIS — G4733 Obstructive sleep apnea (adult) (pediatric): Secondary | ICD-10-CM | POA: Insufficient documentation

## 2017-06-04 DIAGNOSIS — M47812 Spondylosis without myelopathy or radiculopathy, cervical region: Secondary | ICD-10-CM | POA: Diagnosis present

## 2017-06-04 DIAGNOSIS — N179 Acute kidney failure, unspecified: Secondary | ICD-10-CM | POA: Insufficient documentation

## 2017-06-04 DIAGNOSIS — E785 Hyperlipidemia, unspecified: Secondary | ICD-10-CM | POA: Insufficient documentation

## 2017-06-04 DIAGNOSIS — Z888 Allergy status to other drugs, medicaments and biological substances status: Secondary | ICD-10-CM | POA: Insufficient documentation

## 2017-06-04 DIAGNOSIS — R0789 Other chest pain: Secondary | ICD-10-CM | POA: Diagnosis not present

## 2017-06-04 DIAGNOSIS — R05 Cough: Secondary | ICD-10-CM | POA: Diagnosis not present

## 2017-06-04 DIAGNOSIS — R338 Other retention of urine: Secondary | ICD-10-CM | POA: Diagnosis not present

## 2017-06-04 DIAGNOSIS — R0602 Shortness of breath: Secondary | ICD-10-CM | POA: Diagnosis not present

## 2017-06-04 DIAGNOSIS — R5383 Other fatigue: Secondary | ICD-10-CM | POA: Diagnosis not present

## 2017-06-04 HISTORY — DX: Acute kidney failure, unspecified: N17.9

## 2017-06-04 HISTORY — DX: Chronic kidney disease, stage 4 (severe): N18.4

## 2017-06-04 LAB — BASIC METABOLIC PANEL
ANION GAP: 15 (ref 5–15)
BUN: 58 mg/dL — ABNORMAL HIGH (ref 6–20)
CHLORIDE: 102 mmol/L (ref 101–111)
CO2: 23 mmol/L (ref 22–32)
Calcium: 9.4 mg/dL (ref 8.9–10.3)
Creatinine, Ser: 3.56 mg/dL — ABNORMAL HIGH (ref 0.61–1.24)
GFR calc non Af Amer: 17 mL/min — ABNORMAL LOW (ref >60)
GFR, EST AFRICAN AMERICAN: 19 mL/min — AB (ref >60)
Glucose, Bld: 175 mg/dL — ABNORMAL HIGH (ref 65–99)
Potassium: 4.8 mmol/L (ref 3.5–5.1)
Sodium: 140 mmol/L (ref 135–145)

## 2017-06-04 LAB — URINALYSIS, ROUTINE W REFLEX MICROSCOPIC
Bacteria, UA: NONE SEEN
Bilirubin Urine: NEGATIVE
Glucose, UA: NEGATIVE mg/dL
HGB URINE DIPSTICK: NEGATIVE
KETONES UR: NEGATIVE mg/dL
LEUKOCYTES UA: NEGATIVE
NITRITE: NEGATIVE
PROTEIN: 30 mg/dL — AB
Specific Gravity, Urine: 1.024 (ref 1.005–1.030)
pH: 5 (ref 5.0–8.0)

## 2017-06-04 LAB — CBC
HCT: 43.1 % (ref 39.0–52.0)
HEMOGLOBIN: 14.1 g/dL (ref 13.0–17.0)
MCH: 29.8 pg (ref 26.0–34.0)
MCHC: 32.7 g/dL (ref 30.0–36.0)
MCV: 91.1 fL (ref 78.0–100.0)
Platelets: 117 10*3/uL — ABNORMAL LOW (ref 150–400)
RBC: 4.73 MIL/uL (ref 4.22–5.81)
RDW: 13.2 % (ref 11.5–15.5)
WBC: 6.9 10*3/uL (ref 4.0–10.5)

## 2017-06-04 LAB — BRAIN NATRIURETIC PEPTIDE: B NATRIURETIC PEPTIDE 5: 71.7 pg/mL (ref 0.0–100.0)

## 2017-06-04 LAB — GLUCOSE, CAPILLARY
Glucose-Capillary: 142 mg/dL — ABNORMAL HIGH (ref 65–99)
Glucose-Capillary: 208 mg/dL — ABNORMAL HIGH (ref 65–99)

## 2017-06-04 LAB — I-STAT TROPONIN, ED: TROPONIN I, POC: 0 ng/mL (ref 0.00–0.08)

## 2017-06-04 LAB — HEMOGLOBIN A1C
HEMOGLOBIN A1C: 7 % — AB (ref 4.8–5.6)
Mean Plasma Glucose: 154.2 mg/dL

## 2017-06-04 MED ORDER — SODIUM CHLORIDE 0.9 % IV BOLUS
500.0000 mL | Freq: Once | INTRAVENOUS | Status: AC
  Administered 2017-06-04: 500 mL via INTRAVENOUS

## 2017-06-04 MED ORDER — CALCITRIOL 0.25 MCG PO CAPS
0.2500 ug | ORAL_CAPSULE | ORAL | Status: DC
  Administered 2017-06-05 – 2017-06-06 (×2): 0.25 ug via ORAL
  Filled 2017-06-04 (×2): qty 1

## 2017-06-04 MED ORDER — INSULIN GLARGINE 100 UNITS/ML SOLOSTAR PEN
48.0000 [IU] | PEN_INJECTOR | Freq: Every day | SUBCUTANEOUS | Status: DC
  Filled 2017-06-04: qty 3

## 2017-06-04 MED ORDER — ONDANSETRON HCL 4 MG/2ML IJ SOLN: 4.0000 mg | Freq: Four times a day (QID) | INTRAMUSCULAR | Status: DC | PRN

## 2017-06-04 MED ORDER — MAGNESIUM OXIDE 400 (241.3 MG) MG PO TABS
400.0000 mg | ORAL_TABLET | Freq: Every day | ORAL | Status: DC
  Administered 2017-06-04 – 2017-06-06 (×3): 400 mg via ORAL
  Filled 2017-06-04 (×3): qty 1

## 2017-06-04 MED ORDER — HEPARIN SODIUM (PORCINE) 5000 UNIT/ML IJ SOLN
5000.0000 [IU] | Freq: Three times a day (TID) | INTRAMUSCULAR | Status: DC
  Administered 2017-06-04 – 2017-06-06 (×5): 5000 [IU] via SUBCUTANEOUS
  Filled 2017-06-04 (×4): qty 1

## 2017-06-04 MED ORDER — GABAPENTIN 300 MG PO CAPS
300.0000 mg | ORAL_CAPSULE | Freq: Two times a day (BID) | ORAL | Status: DC
  Administered 2017-06-04 – 2017-06-06 (×4): 300 mg via ORAL
  Filled 2017-06-04 (×5): qty 1

## 2017-06-04 MED ORDER — VITAMIN B-12 1000 MCG PO TABS
1000.0000 ug | ORAL_TABLET | Freq: Two times a day (BID) | ORAL | Status: DC
  Administered 2017-06-04 – 2017-06-06 (×5): 1000 ug via ORAL
  Filled 2017-06-04 (×5): qty 1

## 2017-06-04 MED ORDER — SENNOSIDES-DOCUSATE SODIUM 8.6-50 MG PO TABS: 1.0000 | ORAL_TABLET | Freq: Every evening | ORAL | Status: DC | PRN

## 2017-06-04 MED ORDER — TACROLIMUS 0.5 MG PO CAPS
0.5000 mg | ORAL_CAPSULE | Freq: Two times a day (BID) | ORAL | Status: DC
  Filled 2017-06-04: qty 1

## 2017-06-04 MED ORDER — MYCOPHENOLATE SODIUM 180 MG PO TBEC
360.0000 mg | DELAYED_RELEASE_TABLET | Freq: Two times a day (BID) | ORAL | Status: DC
  Administered 2017-06-04 – 2017-06-06 (×4): 360 mg via ORAL
  Filled 2017-06-04 (×5): qty 2

## 2017-06-04 MED ORDER — ATORVASTATIN CALCIUM 40 MG PO TABS
40.0000 mg | ORAL_TABLET | Freq: Every day | ORAL | Status: DC
  Administered 2017-06-04 – 2017-06-05 (×2): 40 mg via ORAL
  Filled 2017-06-04 (×2): qty 1

## 2017-06-04 MED ORDER — PANTOPRAZOLE SODIUM 40 MG PO TBEC
40.0000 mg | DELAYED_RELEASE_TABLET | Freq: Every day | ORAL | Status: DC
  Administered 2017-06-04 – 2017-06-06 (×3): 40 mg via ORAL
  Filled 2017-06-04 (×3): qty 1

## 2017-06-04 MED ORDER — INSULIN GLARGINE 100 UNIT/ML ~~LOC~~ SOLN
48.0000 [IU] | Freq: Every day | SUBCUTANEOUS | Status: DC
  Administered 2017-06-04 – 2017-06-05 (×2): 48 [IU] via SUBCUTANEOUS
  Filled 2017-06-04 (×3): qty 0.48

## 2017-06-04 MED ORDER — ACETAMINOPHEN 650 MG RE SUPP: 650.0000 mg | Freq: Four times a day (QID) | RECTAL | Status: DC | PRN

## 2017-06-04 MED ORDER — LABETALOL HCL 200 MG PO TABS
300.0000 mg | ORAL_TABLET | Freq: Three times a day (TID) | ORAL | Status: DC
  Administered 2017-06-04 – 2017-06-06 (×6): 300 mg via ORAL
  Filled 2017-06-04 (×6): qty 2

## 2017-06-04 MED ORDER — SULFAMETHOXAZOLE-TRIMETHOPRIM 400-80 MG PO TABS
1.0000 | ORAL_TABLET | ORAL | Status: DC
  Administered 2017-06-05: 1 via ORAL
  Filled 2017-06-04: qty 1

## 2017-06-04 MED ORDER — TAMSULOSIN HCL 0.4 MG PO CAPS
0.4000 mg | ORAL_CAPSULE | Freq: Every day | ORAL | Status: DC
  Administered 2017-06-05 – 2017-06-06 (×2): 0.4 mg via ORAL
  Filled 2017-06-04 (×2): qty 1

## 2017-06-04 MED ORDER — HYDROCODONE-ACETAMINOPHEN 10-325 MG PO TABS
1.0000 | ORAL_TABLET | Freq: Four times a day (QID) | ORAL | Status: DC | PRN
  Administered 2017-06-04: 2 via ORAL
  Administered 2017-06-05: 1 via ORAL
  Administered 2017-06-05: 2 via ORAL
  Administered 2017-06-05: 1 via ORAL
  Administered 2017-06-06: 2 via ORAL
  Filled 2017-06-04 (×2): qty 2
  Filled 2017-06-04: qty 1
  Filled 2017-06-04: qty 2
  Filled 2017-06-04: qty 1

## 2017-06-04 MED ORDER — PREDNISONE 5 MG PO TABS
5.0000 mg | ORAL_TABLET | Freq: Every day | ORAL | Status: DC
  Administered 2017-06-05 – 2017-06-06 (×2): 5 mg via ORAL
  Filled 2017-06-04 (×2): qty 1

## 2017-06-04 MED ORDER — ONDANSETRON HCL 4 MG PO TABS: 4.0000 mg | ORAL_TABLET | Freq: Four times a day (QID) | ORAL | Status: DC | PRN

## 2017-06-04 MED ORDER — ACETAMINOPHEN 325 MG PO TABS: 650.0000 mg | ORAL_TABLET | Freq: Four times a day (QID) | ORAL | Status: DC | PRN

## 2017-06-04 MED ORDER — TACROLIMUS 1 MG PO CAPS
2.0000 mg | ORAL_CAPSULE | Freq: Two times a day (BID) | ORAL | Status: DC
  Administered 2017-06-04 – 2017-06-06 (×4): 2 mg via ORAL
  Filled 2017-06-04 (×5): qty 2

## 2017-06-04 MED ORDER — INSULIN ASPART 100 UNIT/ML ~~LOC~~ SOLN
0.0000 [IU] | Freq: Three times a day (TID) | SUBCUTANEOUS | Status: DC
  Administered 2017-06-04: 1 [IU] via SUBCUTANEOUS
  Administered 2017-06-05 (×2): 2 [IU] via SUBCUTANEOUS
  Administered 2017-06-06: 1 [IU] via SUBCUTANEOUS

## 2017-06-04 MED ORDER — SODIUM CHLORIDE 0.9 % IV SOLN
INTRAVENOUS | Status: DC
  Administered 2017-06-04 – 2017-06-05 (×2): via INTRAVENOUS

## 2017-06-04 MED ORDER — BISACODYL 10 MG RE SUPP: 10.0000 mg | Freq: Every day | RECTAL | Status: DC | PRN

## 2017-06-04 NOTE — ED Triage Notes (Signed)
Wife stated, He has had chest pain and is a kidney transplant and has not been able to urinate, and he takes Lasix. Yesterday he had 300cc output and none today. Has not taken any lasix since Friday. He has been drinking but not eating regular. Has had congestion like a cold.

## 2017-06-04 NOTE — H&P (Addendum)
History and Physical    Jon [name] DHD:[id number] DOB: 1950/10/11 DOA: 06/04/2017   PCP: [name], MD   Patient coming from:  Home    Chief Complaint: Generalized weakness, poor oral intake, decreased urine output   HPI: Jon [name] is a 67 y.o. male with medical history significant for chronic diastolic heart failure, CKD stage 4, status post renal transplant at [facility] on September 2015, on chronic immunosuppressive therapy with Prograf, Myfortic, and Bactrim for prophylaxis, prednisone, in town followed by Dr. Moshe Cipro, and also followed at [facility], hypertension, hyperlipidemia, diabetes, OSA on CPAP, history of CVA without residual weakness, presenting to the emergency department with 1 week history of increasing weakness, decreased oral intake, as well as decreased urinary output.  1 week prior, the patient had sore throat, headache and cough, and was treated with Augmentin an outpatient, and finishing these meds on Friday.  The patient is also complaining of left-sided chest discomfort, but admits to having been coughing during that extension of time, and 2 weeks prior, she was lifting some furniture.  He reports sick contacting his daughter about 2 weeks prior to this presentation.  He denies at the time of evaluation any chest pain, palpitations, nausea, vomiting, diaphoresis, vision changes or headaches.  He continues to have mild sore throat.  The patient denies any pleuritic chest pain.  She denies any fever or chills.  He denies any abdominal pain, and he does produce urine, and denies any hematuria or dysuria.  He also denies any lower extremity swelling or calf pain.  He is compliant with his medications.  Of note, he did not get to take his Lasix over the last 2 days, in view of decreased oral intake.  He denies any tobacco, alcohol or recreational drug use.  No hormonal supplements.  Denies any history of PE or DVT.  No recent long distance  travels.  ED Course:  BP (!) 152/78   Pulse 72   Temp 98.6 F (37 C) (Oral)   Resp (!) 21   Ht '5\' 11"'  (1.803 m)   Wt 108 kg (238 lb)   SpO2 95%   BMI 33.19 kg/m   Sodium 140, potassium 4.8, chloride 102, bicarb 23, glucose 175, BUN 58, creatinine 3.56, baseline is around 2.5, with GFR of 17 0.0 troponin EKG shows normal sinus rhythm, without abnormalities. White count 6.9, hemoglobin 14.1, platelets 117 Urinalysis negative Chest x-ray without acute abnormality, stable mild changes of COPD and chronic bronchitis.  Loop recorder in place Last 2D echo December 2018, showed normal systolic, EF 60 to 28%, grade 2 diastolic Rogation of his loop recorder was in April 2018, normal  Review of Systems:  As per HPI otherwise all other systems reviewed and are negative  Past Medical History:  Diagnosis Date  . Arthritis    back, hands   . Chronic back pain    radiculopathy and stenosis  . Chronic kidney disease    [name], awaiting Transplant   . CVA (cerebral infarction) 03/2013  . Diabetes mellitus     Lantus nightly ;type 2  . GERD (gastroesophageal reflux disease)   . H/O hiatal hernia   . History of colon polyps   . Hx of cardiovascular stress test    a.  Lexiscan Myoview (05/2013):  No ischemia, EF 53%; Normal Study  . Hyperlipidemia    takes Lovastatin daily  . Hypertension    takes Amlodipine and Catapres daily    dr  Chrenshaw, Loop Recorder - [facility]  . Nocturia   . Peripheral edema    takes Lasix daily  . Sleep apnea    cpap , study in their home, 09/2102- [facility]           . Stroke (HCC)    speech, rt arm weakness  . Urinary frequency     Past Surgical History:  Procedure Laterality Date  . AV FISTULA PLACEMENT Left 04/03/2013   Procedure: ARTERIOVENOUS (AV) FISTULA CREATION- LEFT RADIOCEPHALIC VS BRACHIOCEPHALIC;  Surgeon: Conrad Cabot, MD;  Location: [place];  Service: Vascular;  Laterality: Left;  . AV FISTULA PLACEMENT Left 05/14/2013   Procedure: LEFT  BRACHIOCEPHALIC ARTERIOVENOUS (AV) FISTULA CREATION;  Surgeon: Conrad Waurika, MD;  Location: [place];  Service: Vascular;  Laterality: Left;  . BACK SURGERY  12/2012   2003- 1st back surgery & then 2014- fusion  . COLONOSCOPY    . ESOPHAGOGASTRODUODENOSCOPY    . HEMORRHOID SURGERY    . KIDNEY TRANSPLANT  2016  . left knee surgery    . LOOP RECORDER IMPLANT  03/19/13   MDT LinQ implanted by Dr Rayann Heman for cryptogenic stroke  . LOOP RECORDER IMPLANT N/A 03/19/2013   Procedure: LOOP RECORDER IMPLANT;  Surgeon: Coralyn Mark, MD;  Location: [place] CATH LAB;  Service: Cardiovascular;  Laterality: N/A;  . NEPHRECTOMY TRANSPLANTED ORGAN    . NEUROPLASTY / TRANSPOSITION MEDIAN NERVE AT CARPAL TUNNEL BILATERAL    . right leg surgery     pin in place  . right wrist surgery    . TEE WITHOUT CARDIOVERSION N/A 03/19/2013   Procedure: TRANSESOPHAGEAL ECHOCARDIOGRAM (TEE);  Surgeon: [name], MD;  Location: [facility] ENDOSCOPY;  Service: Cardiovascular;  Laterality: N/A;    Social History Social History   Socioeconomic History  . Marital status: Married    Spouse name: agnes  . Number of children: 5  . Years of education: 8th  . Highest education level: Not on file  Occupational History  . Occupation: disabled  Social Needs  . Financial resource strain: Not on file  . Food insecurity:    Worry: Not on file    Inability: Not on file  . Transportation needs:    Medical: Not on file    Non-medical: Not on file  Tobacco Use  . Smoking status: Former Smoker    Packs/day: 0.50    Years: 40.00    Pack years: 20.00    Types: Cigarettes    Last attempt to quit: 12/27/2012    Years since quitting: 4.4  . Smokeless tobacco: Never Used  Substance and Sexual Activity  . Alcohol use: No    Alcohol/week: 0.0 oz  . Drug use: No  . Sexual activity: Yes  Lifestyle  . Physical activity:    Days per week: Not on file    Minutes per session: Not on file  . Stress: Not on file  Relationships  . Social  connections:    Talks on phone: Not on file    Gets together: Not on file    Attends religious service: Not on file    Active member of club or organization: Not on file    Attends meetings of clubs or organizations: Not on file    Relationship status: Not on file  . Intimate partner violence:    Fear of current or ex partner: Not on file    Emotionally abused: Not on file    Physically abused: Not on file  Forced sexual activity: Not on file  Other Topics Concern  . Not on file  Social History Narrative   Patient lives at home with his wife Herbert Pun).  One story home with basement.   Patient is disabled.   Education 8th grade.   Right handed.   Caffeine One cup of coffee daily.   Retired Administrator.   5 children.     Allergies  Allergen Reactions  . Lisinopril Other (See Comments)    Increased potassium level   . Meloxicam Nausea And Vomiting  . Victoza [Liraglutide] Diarrhea, Nausea And Vomiting and Swelling    Family History  Problem Relation Age of Onset  . Hypertension Mother   . Hyperlipidemia Father   . Hypertension Father   . Heart disease Father        before age 66  . Renal Disease Father   . Diabetes Brother   . Hyperlipidemia Son       Prior to Admission medications   Medication Sig Start Date End Date Taking? Authorizing Provider  aspirin EC 81 MG tablet Take 81 mg by mouth at bedtime.    [provider]  atorvastatin (LIPITOR) 40 MG tablet Take 40 mg by mouth at bedtime.     [provider]  Blood Glucose Monitoring Suppl (ONE TOUCH ULTRA 2) w/Device KIT  09/15/16   [provider]  calcitRIOL (ROCALTROL) 0.25 MCG capsule Take 0.25 mcg by mouth See admin instructions. Take 1 capsule (0.25 mcg) by mouth five times daily - Monday thru Friday    [provider]  furosemide (LASIX) 80 MG tablet Take 2 tablets (160 mg total) by mouth 3 (three) times daily. 01/04/17   Mariel Aloe, MD  gabapentin (NEURONTIN) 300 MG  capsule Take 300 mg by mouth 2 (two) times daily.    [provider]  HYDROcodone-acetaminophen (NORCO) 10-325 MG tablet Take 1-2 tablets by mouth every 6 (six) hours as needed (pain).  12/26/16   [provider]  insulin glargine (LANTUS) 100 unit/mL SOPN Inject 48 Units into the skin at bedtime.    [provider]  insulin lispro (HUMALOG KWIKPEN) 100 UNIT/ML KiwkPen Inject 5-15 Units into the skin See admin instructions. Inject 5 units subcutaneously three times daily before meals adjusted per carb count - add 2 units for every 50 carbs to be consumed    [provider]  labetalol (NORMODYNE) 300 MG tablet Take 300 mg by mouth 3 (three) times daily.     [provider]  magnesium oxide (MAG-OX) 400 MG tablet Take 400 mg by mouth daily.    [provider]  Multiple Vitamin (MULTIVITAMIN WITH MINERALS) TABS tablet Take 1 tablet by mouth daily.    [provider]  mycophenolate (MYFORTIC) 180 MG EC tablet Take 180 mg by mouth 2 (two) times daily.    [provider]  omeprazole (PRILOSEC) 20 MG capsule Take 20 mg by mouth daily.    [provider]  Potassium Citrate 15 MEQ (1620 MG) TBCR Take 1 tablet by mouth 3 (three) times daily.  09/21/16   [provider]  predniSONE (DELTASONE) 5 MG tablet Take 5 mg by mouth daily with breakfast.    [provider]  sulfamethoxazole-trimethoprim (BACTRIM,SEPTRA) 400-80 MG tablet Take 1 tablet by mouth every Monday, Wednesday, and Friday.     [provider]  tacrolimus (PROGRAF) 0.5 MG capsule Take 0.5 mg by mouth 2 (two) times daily.    [provider]  tamsulosin (FLOMAX) 0.4 MG CAPS capsule Take 0.4 mg by mouth daily after breakfast.    [provider]  vitamin B-12 (CYANOCOBALAMIN) 1000 MCG tablet Take 1,000 mcg by mouth 2 (two) times daily.     [provider]    Physical Exam:  Vitals:   06/04/17 0807 06/04/17 1153  06/04/17 1230 06/04/17 1330  BP: 138/71 136/71 (!) 152/77 (!) 152/78  Pulse: 88 72 73 72  Resp: '18 18 14 ' (!) 21  Temp: 98.6 F (37 C)     TempSrc: Oral     SpO2: 93% 95% (!) 88% 95%  Weight: 108 kg (238 lb)     Height: '5\' 11"'  (1.803 m)      Constitutional: NAD, calm, comfortable, but weak appearing  Eyes: PERRL, lids and conjunctivae normal ENMT: Mucous membranes are moist, without exudate or lesions  Neck: normal, supple, no masses, no thyromegaly Respiratory: clear to auscultation bilaterally, no wheezing, no crackles. Normal respiratory effort  Cardiovascular: Regular rate and rhythm,  murmur, rubs or gallops. No extremity edema. 2+ pedal pulses. No carotid bruits.  Patient has a left AV F, nontender.  Good thrill Abdomen: Soft, protuberant non tender, No hepatosplenomegaly. Bowel sounds positive.  Well-healed transplant scar on the right lower quadrant. Musculoskeletal: no clubbing / cyanosis. Moves all extremities Skin: no jaundice, No lesions.  Neurologic: Sensation intact  Strength equal in all extremities Psychiatric:   Alert and oriented x 3. Normal mood.     Labs on Admission: I have personally reviewed following labs and imaging studies  CBC: Recent Labs  Lab 06/04/17 0838  WBC 6.9  HGB 14.1  HCT 43.1  MCV 91.1  PLT 117*    Basic Metabolic Panel: Recent Labs  Lab 06/04/17 0838  NA 140  K 4.8  CL 102  CO2 23  GLUCOSE 175*  BUN 58*  CREATININE 3.56*  CALCIUM 9.4    GFR: Estimated Creatinine Clearance: 25.9 mL/min (A) (by C-G formula based on SCr of 3.56 mg/dL (H)).  Liver Function Tests: No results for input(s): AST, ALT, ALKPHOS, BILITOT, PROT, ALBUMIN in the last 168 hours. No results for input(s): LIPASE, AMYLASE in the last 168 hours. No results for input(s): AMMONIA in the last 168 hours.  Coagulation Profile: No results for input(s): INR, PROTIME in the last 168 hours.  Cardiac Enzymes: No results for input(s): CKTOTAL, CKMB, CKMBINDEX,  TROPONINI in the last 168 hours.  BNP (last 3 results) No results for input(s): PROBNP in the last 8760 hours.  HbA1C: No results for input(s): HGBA1C in the last 72 hours.  CBG: No results for input(s): GLUCAP in the last 168 hours.  Lipid Profile: No results for input(s): CHOL, HDL, LDLCALC, TRIG, CHOLHDL, LDLDIRECT in the last 72 hours.  Thyroid Function Tests: No results for input(s): TSH, T4TOTAL, FREET4, T3FREE, THYROIDAB in the last 72 hours.  Anemia Panel: No results for input(s): VITAMINB12, FOLATE, FERRITIN, TIBC, IRON, RETICCTPCT in the last 72 hours.  Urine analysis:    Component Value Date/Time   COLORURINE YELLOW 06/04/2017 [place] 06/04/2017 0833   LABSPEC 1.024 06/04/2017 0833   PHURINE 5.0 06/04/2017 0833   GLUCOSEU NEGATIVE 06/04/2017 0833   HGBUR NEGATIVE 06/04/2017 0833   BILIRUBINUR NEGATIVE 06/04/2017 0833   KETONESUR NEGATIVE 06/04/2017 0833   PROTEINUR 30 (A) 06/04/2017 0833   UROBILINOGEN 0.2 01/23/2011 0551   NITRITE NEGATIVE 06/04/2017 0833   LEUKOCYTESUR NEGATIVE 06/04/2017 0833    Sepsis Labs: '@LABRCNTIP' (procalcitonin:4,lacticidven:4) )No  results found for this or any previous visit (from the past 240 hour(s)).   Radiological Exams on Admission: Dg Chest 2 View  Result Date: 06/04/2017 CLINICAL DATA:  Chest pain, cough and shortness of breath. Ex-smoker. EXAM: CHEST - 2 VIEW COMPARISON:  01/01/2017. FINDINGS: The cardiac silhouette remains borderline enlarged. A loop recorder is again demonstrated overlying the upper heart on the left. Mild diffuse peribronchial thickening and accentuation of the interstitial markings is unchanged. The lungs remain mildly hyperexpanded. Mild thoracic spine degenerative changes. IMPRESSION: No acute abnormality. Stable mild changes of COPD and chronic bronchitis. Electronically Signed   By: Claudie Revering M.D.   On: 06/04/2017 09:05    EKG: Independently reviewed.  Assessment/Plan Principal  Problem:   Acute renal failure superimposed on stage 4 chronic kidney disease (HCC) Active Problems:   Diabetes mellitus ([place])   History of kidney transplant   Chronic back pain   Lumbar stenosis with neurogenic claudication   History of stroke   HLD (hyperlipidemia)   OSA (obstructive sleep apnea)   GERD (gastroesophageal reflux disease)   Acute on Chronic CKD, in a patient with history of renal transplant in 2015, on tacrolimus, Myfortic, prednisone, and Bactrim.: likely due to dehydration  Diuretics, recent antibiotic for upper respiratory infection,   current Cr is 3.56, baseline is 2.5.  Last diuretic use was 2 days ago.,  Held due to poor oral intake.  He had some degree of decreased urinary output, but denies dysuria or gross hematuria.  The patient is followed by Dr. Moshe Cipro.  Lab Results  Component Value Date   CREATININE 3.56 (H) 06/04/2017   CREATININE 2.74 (H) 01/04/2017   CREATININE 2.72 (H) 01/03/2017  Hold diuretics IVF normal saline 100 cc for the next day, in addition to 500 cc bolus x1 Follow BMP daily Continue Flomax Continue Myfortic, prednisone and Bactrim, tacrolimus, then follow-up at [place]  Left-sided chest pain in a patient with CHF,, with negative cardiac work-up.  Patient has a history of loop recorder over the last 2 years, with last interrogation, normal about 1 year ago.  EKG and troponin are normal.  He did have recent upper respiratory infection with forceful coughing, as well as some exertion lifting heavy weights.  Last 2D echo in December 2018, showing EF 60 to 60%, grade 2 diastolic, normal systolic.  Chest x-ray  without acute abnormality, stable mild changes of COPD and chronic bronchitis. WBC normal.  Loop recorder in place.   at this time, the patient is chest pain-free. Continue to monitor for symptoms, but no intervention is indicated at this time. If chest pain returns, may need another evaluation while in the  hospital.   Hypertension BP  152/78   Pulse 72   Continue home anti-hypertensive medications with labetalol, but Lasix will be placed on hold.   Hyperlipidemia Continue home statins  Peripheral neuropathy Continue Neurontin   Type II Diabetes Current blood sugar level is 175 Lab Results  Component Value Date   HGBA1C 7.0 (H) 03/16/2013  Hgb A1C Lantus , SSI   GERD, no acute symptoms Continue PPI  Thrombocytopenia likely due to suppressive drugs No transfusion is indicated at this time Monitor counts closely Transfuse 1 unit of platelets if count is less or equal than 10,000 or 20,000 if the patient is acutely bleeding Hold heparin if  platelets drop to less than 70,000 Continue prednisone, as well as B12  OSA Continue CPAP nightly   DVT prophylaxis: Heparin subcu Code  Status:    Full Family Communication:  Discussed with patient wife  disposition Plan: Expect patient to be discharged to home after condition improves Consults called:    Nephrology per EDP Admission status: Telemetry observation    Sharene Butters, PA-C [facility]   Amion text  [phone]   06/04/2017, 2:21 PM    Patient seen and examined and agree with assessment and plan as per S. Wertman, PA, with additions as follows.  Renal transplant patient with 1 week hx of URI symptoms presenting with generalized weakness and fatigue, found to have ^creat from baseline.  Suspect vol depletion, no signs of serious acute illness other than vol depletion. No transplant tenderness and UA is negative.  Plan is admit, hold lasix , give IVF's and repeat creatinine in am.  Continue all transplant meds.  Renal consult if not improving.   Kelly Splinter MD [facility] pgr [phone] 06/04/2017, 9:25 AM

## 2017-06-04 NOTE — ED Triage Notes (Signed)
Pt. Took 2 Hydrocodone this morning 0700

## 2017-06-04 NOTE — ED Provider Notes (Signed)
[facility] EMERGENCY DEPARTMENT Provider Note   CSN: [id number] Arrival date & time: 06/04/17  0741     History   Chief Complaint Chief Complaint  Patient presents with  . Chest Pain  . Urinary Retention    HPI RHONDA VANGIESON is a 67 y.o. male.  HPI   Patient is a 67 year old male past medical history significant for chronic kidney disease, renal transplantin 2015on chronic immunosuppressive therapy,HTN, HLD, DM type II, OSAon CPAP.   Patient on Bactrim for prophylaxis and tacrolimus.   1 week histoyr of sore throat headache and cough for the last week, treated with augmetnin.  Less food intake.  Has been taking normal fluids. But noticed decreasing UOP. .  Have CP in the last day.  Worse with exertion associated with SOB.   Past Medical History:  Diagnosis Date  . Arthritis    back, hands   . Chronic back pain    radiculopathy and stenosis  . Chronic kidney disease    [name], awaiting Transplant   . CVA (cerebral infarction) 03/2013  . Diabetes mellitus     Lantus nightly ;type 2  . GERD (gastroesophageal reflux disease)   . H/O hiatal hernia   . History of colon polyps   . Hx of cardiovascular stress test    a.  Lexiscan Myoview (05/2013):  No ischemia, EF 53%; Normal Study  . Hyperlipidemia    takes Lovastatin daily  . Hypertension    takes Amlodipine and Catapres daily    dr Forrestine Him, Loop Recorder - [facility]  . Nocturia   . Peripheral edema    takes Lasix daily  . Sleep apnea    cpap , study in their home, 09/2102- [facility]           . Stroke (HCC)    speech, rt arm weakness  . Urinary frequency     Patient Active Problem List   Diagnosis Date Noted  . Peripheral edema 01/02/2017  . Finger infection, fungal 11/22/2016  . Dyspnea on exertion 08/24/2016  . HLD (hyperlipidemia) 05/02/2013  . OSA (obstructive sleep apnea) 05/02/2013  . History of stroke 03/16/2013  . Movement disorder 03/16/2013  . Lumbar stenosis with  neurogenic claudication 12/27/2012  . Chronic back pain 01/27/2011  . Tobacco use disorder in remission 01/27/2011  . HTN (hypertension) 01/27/2011  . History of kidney transplant 01/24/2011  . Hyperkalemia 01/23/2011  . Diabetes mellitus ([place]) 01/23/2011    Past Surgical History:  Procedure Laterality Date  . AV FISTULA PLACEMENT Left 04/03/2013   Procedure: ARTERIOVENOUS (AV) FISTULA CREATION- LEFT RADIOCEPHALIC VS BRACHIOCEPHALIC;  Surgeon: Conrad Calera, MD;  Location: [facility];  Service: Vascular;  Laterality: Left;  . AV FISTULA PLACEMENT Left 05/14/2013   Procedure: LEFT BRACHIOCEPHALIC ARTERIOVENOUS (AV) FISTULA CREATION;  Surgeon: Conrad Graford, MD;  Location: [facility];  Service: Vascular;  Laterality: Left;  . BACK SURGERY  12/2012   2003- 1st back surgery & then 2014- fusion  . COLONOSCOPY    . ESOPHAGOGASTRODUODENOSCOPY    . HEMORRHOID SURGERY    . KIDNEY TRANSPLANT  2016  . left knee surgery    . LOOP RECORDER IMPLANT  03/19/13   MDT LinQ implanted by Dr Rayann Heman for cryptogenic stroke  . LOOP RECORDER IMPLANT N/A 03/19/2013   Procedure: LOOP RECORDER IMPLANT;  Surgeon: Coralyn Mark, MD;  Location: [facility] CATH LAB;  Service: Cardiovascular;  Laterality: N/A;  . NEPHRECTOMY TRANSPLANTED ORGAN    . NEUROPLASTY /  TRANSPOSITION MEDIAN NERVE AT CARPAL TUNNEL BILATERAL    . right leg surgery     pin in place  . right wrist surgery    . TEE WITHOUT CARDIOVERSION N/A 03/19/2013   Procedure: TRANSESOPHAGEAL ECHOCARDIOGRAM (TEE);  Surgeon: [name], MD;  Location: [facility] END[facility]COPY;  Service: Cardiovascular;  Laterality: N/A;        Home Medications    Prior to Admission medications   Medication Sig Start Date End Date Taking? Authorizing Provider  aspirin EC 81 MG tablet Take 81 mg by mouth at bedtime.    [provider]  atorvastatin (LIPITOR) 40 MG tablet Take 40 mg by mouth at bedtime.     [provider]  Blood Glucose Monitoring Suppl (ONE TOUCH ULTRA 2)  w/Device KIT  09/15/16   [provider]  calcitRIOL (ROCALTROL) 0.25 MCG capsule Take 0.25 mcg by mouth See admin instructions. Take 1 capsule (0.25 mcg) by mouth five times daily - Monday thru Friday    [provider]  furosemide (LASIX) 80 MG tablet Take 2 tablets (160 mg total) by mouth 3 (three) times daily. 01/04/17   Mariel Aloe, MD  gabapentin (NEURONTIN) 300 MG capsule Take 300 mg by mouth 2 (two) times daily.    [provider]  HYDROcodone-acetaminophen (NORCO) 10-325 MG tablet Take 1-2 tablets by mouth every 6 (six) hours as needed (pain).  12/26/16   [provider]  insulin glargine (LANTUS) 100 unit/mL SOPN Inject 48 Units into the skin at bedtime.    [provider]  insulin lispro (HUMALOG KWIKPEN) 100 UNIT/ML KiwkPen Inject 5-15 Units into the skin See admin instructions. Inject 5 units subcutaneously three times daily before meals adjusted per carb count - add 2 units for every 50 carbs to be consumed    [provider]  labetalol (NORMODYNE) 300 MG tablet Take 300 mg by mouth 3 (three) times daily.     [provider]  magnesium oxide (MAG-OX) 400 MG tablet Take 400 mg by mouth daily.    [provider]  Multiple Vitamin (MULTIVITAMIN WITH MINERALS) TABS tablet Take 1 tablet by mouth daily.    [provider]  mycophenolate (MYFORTIC) 180 MG EC tablet Take 180 mg by mouth 2 (two) times daily.    [provider]  omeprazole (PRILOSEC) 20 MG capsule Take 20 mg by mouth daily.    [provider]  Potassium Citrate 15 MEQ (1620 MG) TBCR Take 1 tablet by mouth 3 (three) times daily.  09/21/16   [provider]  predniSONE (DELTASONE) 5 MG tablet Take 5 mg by mouth daily with breakfast.    [provider]  sulfamethoxazole-trimethoprim (BACTRIM,SEPTRA) 400-80 MG tablet Take 1 tablet by mouth every Monday, Wednesday, and Friday.     [provider]  tacrolimus  (PROGRAF) 0.5 MG capsule Take 0.5 mg by mouth 2 (two) times daily.    [provider]  tamsulosin (FLOMAX) 0.4 MG CAPS capsule Take 0.4 mg by mouth daily after breakfast.    [provider]  vitamin B-12 (CYANOCOBALAMIN) 1000 MCG tablet Take 1,000 mcg by mouth 2 (two) times daily.     [provider]    Family History Family History  Problem Relation Age of Onset  . Hypertension Mother   . Hyperlipidemia Father   . Hypertension Father   . Heart disease Father        before age 92  . Renal Disease Father   .  Diabetes Brother   . Hyperlipidemia Son     Social History Social History   Tobacco Use  . Smoking status: Former Smoker    Packs/day: 0.50    Years: 40.00    Pack years: 20.00    Types: Cigarettes    Last attempt to quit: 12/27/2012    Years since quitting: 4.4  . Smokeless tobacco: Never Used  Substance Use Topics  . Alcohol use: No    Alcohol/week: 0.0 oz  . Drug use: No     Allergies   Lisinopril; Meloxicam; and Victoza [liraglutide]   Review of Systems Review of Systems  Constitutional: Positive for fatigue. Negative for activity change and fever.  Respiratory: Positive for shortness of breath.   Cardiovascular: Positive for chest pain.  Gastrointestinal: Negative for abdominal pain.     Physical Exam Updated Vital Signs BP 136/71   Pulse 72   Temp 98.6 F (37 C) (Oral)   Resp 18   Ht '5\' 11"'  (1.803 m)   Wt 108 kg (238 lb)   SpO2 95%   BMI 33.19 kg/m   Physical Exam  Constitutional: He is oriented to person, place, and time. He appears well-nourished.  HENT:  Head: Normocephalic.  Eyes: Conjunctivae are normal.  Cardiovascular: Normal rate, regular rhythm and normal pulses.  Pulmonary/Chest: Effort normal and breath sounds normal. No accessory muscle usage. No respiratory distress. He has no decreased breath sounds.  Abdominal: Soft. Bowel sounds are normal. There is no tenderness.  Musculoskeletal:       Right  lower leg: He exhibits no edema.       Left lower leg: He exhibits no edema.  Neurological: He is oriented to person, place, and time.  Skin: Skin is warm and dry. He is not diaphoretic.  Psychiatric: He has a normal mood and affect. His behavior is normal.  Nursing note and vitals reviewed.    ED Treatments / Results  Labs (all labs ordered are listed, but only abnormal results are displayed) Labs Reviewed  BASIC METABOLIC PANEL - Abnormal; Notable for the following components:      Result Value   Glucose, Bld 175 (*)    BUN 58 (*)    Creatinine, Ser 3.56 (*)    GFR calc non Af Amer 17 (*)    GFR calc Af Amer 19 (*)    All other components within normal limits  CBC - Abnormal; Notable for the following components:   Platelets 117 (*)    All other components within normal limits  URINALYSIS, ROUTINE W REFLEX MICROSCOPIC - Abnormal; Notable for the following components:   Protein, ur 30 (*)    All other components within normal limits  I-STAT TROPONIN, ED    EKG EKG Interpretation  Date/Time:  Sunday Jun 04 2017 08:05:51 EDT Ventricular Rate:  84 PR Interval:  164 QRS Duration: 84 QT Interval:  356 QTC Calculation: 420 R Axis:   -3 Text Interpretation:  Normal sinus rhythm Normal ECG Normal sinus rhythm Confirmed by Thomasene Lot, [place] [id number]) on 06/04/2017 12:09:31 PM   Radiology Dg Chest 2 View  Result Date: 06/04/2017 CLINICAL DATA:  Chest pain, cough and shortness of breath. Ex-smoker. EXAM: CHEST - 2 VIEW COMPARISON:  01/01/2017. FINDINGS: The cardiac silhouette remains borderline enlarged. A loop recorder is again demonstrated overlying the upper heart on the left. Mild diffuse peribronchial thickening and accentuation of the interstitial markings is unchanged. The lungs remain mildly hyperexpanded. Mild thoracic spine degenerative changes. IMPRESSION: No acute  abnormality. Stable mild changes of COPD and chronic bronchitis. Electronically Signed   By: Claudie Revering  M.D.   On: 06/04/2017 09:05    Procedures Procedures (including critical care time)  Medications Ordered in ED Medications - No data to display   Initial Impression / Assessment and Plan / ED Course  I have reviewed the triage vital signs and the nursing notes.  Pertinent labs & imaging results that were available during my care of the patient were reviewed by me and considered in my medical decision making (see chart for details).     Patient is a 67 year old male past medical history significant for chronic kidney disease, renal transplantin 2015on chronic immunosuppressive therapy,HTN, HLD, DM type II, OSAon CPAP.   Patient on Bactrim for prophylaxis and tacrolimus.  1 week histoyr of sore throat headache and cough for the last week, treated with augmetnin.  Less food intake.  Has been taking normal fluids. But noticed decreasing UOP. .  Have CP in the last day.  Worse with exertion associated with SOB.  Patient has not taken his Lasix in a day and a half.  Normally on 160 mg in the morning and 80 at night.  12:41 PM Cr on 5/21 2.86 at that time.   1:23 PM Discussed with nephrology.  They recommend admission to medicine, gentle fluid hydration, and recheck of creatinine.  Final Clinical Impressions(s) / ED Diagnoses   Final diagnoses:  None    ED Discharge Orders    None       Macarthur Critchley, MD 06/07/17 [phone]

## 2017-06-05 DIAGNOSIS — N170 Acute kidney failure with tubular necrosis: Secondary | ICD-10-CM

## 2017-06-05 DIAGNOSIS — N184 Chronic kidney disease, stage 4 (severe): Secondary | ICD-10-CM

## 2017-06-05 DIAGNOSIS — K219 Gastro-esophageal reflux disease without esophagitis: Secondary | ICD-10-CM

## 2017-06-05 DIAGNOSIS — E0800 Diabetes mellitus due to underlying condition with hyperosmolarity without nonketotic hyperglycemic-hyperosmolar coma (NKHHC): Secondary | ICD-10-CM

## 2017-06-05 DIAGNOSIS — N179 Acute kidney failure, unspecified: Secondary | ICD-10-CM | POA: Diagnosis not present

## 2017-06-05 LAB — CBC
HCT: 38.5 % — ABNORMAL LOW (ref 39.0–52.0)
HEMOGLOBIN: 12.6 g/dL — AB (ref 13.0–17.0)
MCH: 29.6 pg (ref 26.0–34.0)
MCHC: 32.7 g/dL (ref 30.0–36.0)
MCV: 90.6 fL (ref 78.0–100.0)
Platelets: 109 10*3/uL — ABNORMAL LOW (ref 150–400)
RBC: 4.25 MIL/uL (ref 4.22–5.81)
RDW: 13.2 % (ref 11.5–15.5)
WBC: 6.8 10*3/uL (ref 4.0–10.5)

## 2017-06-05 LAB — GLUCOSE, CAPILLARY
GLUCOSE-CAPILLARY: 100 mg/dL — AB (ref 65–99)
GLUCOSE-CAPILLARY: 177 mg/dL — AB (ref 65–99)
GLUCOSE-CAPILLARY: 200 mg/dL — AB (ref 65–99)
Glucose-Capillary: 173 mg/dL — ABNORMAL HIGH (ref 65–99)

## 2017-06-05 LAB — BASIC METABOLIC PANEL
ANION GAP: 8 (ref 5–15)
BUN: 47 mg/dL — ABNORMAL HIGH (ref 6–20)
CALCIUM: 8.9 mg/dL (ref 8.9–10.3)
CO2: 23 mmol/L (ref 22–32)
CREATININE: 2.6 mg/dL — AB (ref 0.61–1.24)
Chloride: 108 mmol/L (ref 101–111)
GFR calc Af Amer: 28 mL/min — ABNORMAL LOW (ref >60)
GFR calc non Af Amer: 24 mL/min — ABNORMAL LOW (ref >60)
Glucose, Bld: 120 mg/dL — ABNORMAL HIGH (ref 65–99)
Potassium: 4.3 mmol/L (ref 3.5–5.1)
Sodium: 139 mmol/L (ref 135–145)

## 2017-06-05 MED ORDER — FUROSEMIDE 40 MG PO TABS
40.0000 mg | ORAL_TABLET | Freq: Two times a day (BID) | ORAL | Status: DC
  Administered 2017-06-05 – 2017-06-06 (×3): 40 mg via ORAL
  Filled 2017-06-05 (×3): qty 1

## 2017-06-05 MED ORDER — DM-GUAIFENESIN ER 30-600 MG PO TB12
1.0000 | ORAL_TABLET | Freq: Two times a day (BID) | ORAL | Status: DC
  Administered 2017-06-05 – 2017-06-06 (×3): 1 via ORAL
  Filled 2017-06-05 (×3): qty 1

## 2017-06-05 NOTE — Progress Notes (Signed)
@IPLOG @        PROGRESS NOTE                                                                                                                                                                                                             Patient Demographics:    Yvonne Petite, is a 67 y.o. male, DOB - 06/25/1950, HFW:[id number]  Admit date - 06/04/2017   Admitting Physician Roney Jaffe, MD  Outpatient Primary MD for the patient is [name], MD  LOS - 0  Chief Complaint  Patient presents with  . Chest Pain  . Urinary Retention       Brief Narrative  LIAN TANORI is a 67 y.o. male with medical history significant for chronic diastolic heart failure, CKD stage 4, status post renal transplant at [facility] on September 2015, on chronic immunosuppressive therapy with Prograf, Myfortic, and Bactrim for prophylaxis, prednisone, in town followed by Dr. Moshe Cipro, and also followed at [facility], hypertension, hyperlipidemia, diabetes, OSA on CPAP, history of CVA without residual weakness, presenting to the emergency department with 1 week history of increasing weakness, decreased oral intake, as well as decreased urinary output.  In the ER he also had some atypical chest discomfort after over exertion and lifting weights recently, he was diagnosed with acute renal failure, dehydration with musculoskeletal chest discomfort.   Subjective:    Branch Pacitti today has, No headache, No chest pain, No abdominal pain - No Nausea, No new weakness tingling or numbness, No Cough - SOB.  He still feels weak and tired overall.   Assessment  & Plan :     1.  Acute renal failure on CKD stage V.  History of renal transplant in 2015 on combination of tacrolimus, Myfortic, prednisone and 3 times a week Bactrim.  Recent URI with antibiotic exposure, also on baseline high-dose diuretics.  Condition of above causing dehydration and acute renal failure, with holding offending medications  and gentle IV fluids his renal function is back to baseline.  He is now developing few crackles, will put him on low-dose Lasix 40 twice daily and stop IV fluids, monitor nightly.  If stable outpatient follow-up with primary nephrologist Dr. Clover Mealy.  2.  Atypical muscular skeletal chest pain.  Echo from December 2018 showing a EF of 60% with grade 2 chronic diastolic CHF.  His chest pain has been there off and on for several days, but was exposed to with lifting heavy weights and multiple coughing spells recently, completely pain-free now,  EKG unremarkable, troponin negative.  No further work-up inpatient at this time.  3.  Hypertension.  Continue combination of labetalol along with low-dose Lasix for now.  4.  Dysipidemia.  On statin.  5.  GERD.  On PPI continue.  6.  BPH.  On Flomax continue.    7.  Generalized weakness and deconditioning.  PT OT, encouraged to advance activity.    8. DM type II.  On Lantus and sliding scale.  Monitor CBGs closely.  CBG (last 3)  Recent Labs    06/04/17 2051 06/05/17 0719 06/05/17 1139  GLUCAP 208* 100* 173*     Diet :  Diet Order           Diet heart healthy/carb modified Room service appropriate? Yes; Fluid consistency: Thin  Diet effective now           Family Communication  : wife  Code Status : Full  Disposition Plan  : Home tomorrow  Consults  : None  Procedures  : None  DVT Prophylaxis  :  Heparin   Lab Results  Component Value Date   PLT 109 (L) 06/05/2017    Inpatient Medications  Scheduled Meds: . atorvastatin  40 mg Oral QHS  . calcitRIOL  0.25 mcg Oral Once per day on Mon Tue Wed Thu Fri  . furosemide  40 mg Oral BID  . gabapentin  300 mg Oral BID  . heparin  5,000 Units Subcutaneous Q8H  . insulin aspart  0-9 Units Subcutaneous TID WC  . insulin glargine  48 Units Subcutaneous QHS  . labetalol  300 mg Oral TID  . magnesium oxide  400 mg Oral Daily  . mycophenolate  360 mg Oral BID  . pantoprazole  40  mg Oral Daily  . predniSONE  5 mg Oral Q breakfast  . sulfamethoxazole-trimethoprim  1 tablet Oral Q M,W,F  . tacrolimus  2 mg Oral BID  . tamsulosin  0.4 mg Oral QPC breakfast  . vitamin B-12  1,000 mcg Oral BID   Continuous Infusions: PRN Meds:.acetaminophen **OR** acetaminophen, bisacodyl, HYDROcodone-acetaminophen, ondansetron **OR** ondansetron (ZOFRAN) IV, senna-docusate  Antibiotics  :    Anti-infectives (From admission, onward)   Start     Dose/Rate Route Frequency Ordered Stop   06/05/17 1000  sulfamethoxazole-trimethoprim (BACTRIM,SEPTRA) 400-80 MG per tablet 1 tablet     1 tablet Oral Every M-W-F 06/04/17 1403           Objective:   Vitals:   06/04/17 2049 06/04/17 2212 06/05/17 0907 06/05/17 1032  BP: 125/61  136/66 137/70  Pulse: 82 85 84 87  Resp: 17 18 18    Temp: 99 F (37.2 C)  98.9 F (37.2 C)   TempSrc: Oral  Oral   SpO2: 90% 92% 91%   Weight:      Height:        Wt Readings from Last 3 Encounters:  06/04/17 108 kg (238 lb)  04/18/17 115.2 kg (254 lb)  02/13/17 113.4 kg (250 lb)     Intake/Output Summary (Last 24 hours) at 06/05/2017 1210 Last data filed at 06/05/2017 0935 Gross per 24 hour  Intake 1830 ml  Output 200 ml  Net 1630 ml     Physical Exam  Awake Alert, Oriented X 3, No new F.N deficits, Normal affect [place].AT,PERRAL Supple Neck,No JVD, No cervical lymphadenopathy appriciated.  Symmetrical Chest wall movement, Good air movement bilaterally, few crackles bilaterally RRR,No Gallops,Rubs or new Murmurs, No Parasternal Heave +ve B.Sounds, Abd Soft,  No tenderness, No organomegaly appriciated, No rebound - guarding or rigidity. No Cyanosis, Clubbing or edema, No new Rash or bruise       Data Review:    CBC Recent Labs  Lab 06/04/17 0838 06/05/17 0339  WBC 6.9 6.8  HGB 14.1 12.6*  HCT 43.1 38.5*  PLT 117* 109*  MCV 91.1 90.6  MCH 29.8 29.6  MCHC 32.7 32.7  RDW 13.2 13.2    Chemistries  Recent Labs  Lab 06/04/17 0838  06/05/17 0339  NA 140 139  K 4.8 4.3  CL 102 108  CO2 23 23  GLUCOSE 175* 120*  BUN 58* 47*  CREATININE 3.56* 2.60*  CALCIUM 9.4 8.9   ------------------------------------------------------------------------------------------------------------------ No results for input(s): CHOL, HDL, LDLCALC, TRIG, CHOLHDL, LDLDIRECT in the last 72 hours.  Lab Results  Component Value Date   HGBA1C 7.0 (H) 06/04/2017   ------------------------------------------------------------------------------------------------------------------ No results for input(s): TSH, T4TOTAL, T3FREE, THYROIDAB in the last 72 hours.  Invalid input(s): FREET3 ------------------------------------------------------------------------------------------------------------------ No results for input(s): VITAMINB12, FOLATE, FERRITIN, TIBC, IRON, RETICCTPCT in the last 72 hours.  Coagulation profile No results for input(s): INR, PROTIME in the last 168 hours.  No results for input(s): DDIMER in the last 72 hours.  Cardiac Enzymes No results for input(s): CKMB, TROPONINI, MYOGLOBIN in the last 168 hours.  Invalid input(s): CK ------------------------------------------------------------------------------------------------------------------    Component Value Date/Time   BNP 71.7 06/04/2017 1304    Micro Results No results found for this or any previous visit (from the past 240 hour(s)).  Radiology Reports Dg Chest 2 View  Result Date: 06/04/2017 CLINICAL DATA:  Chest pain, cough and shortness of breath. Ex-smoker. EXAM: CHEST - 2 VIEW COMPARISON:  01/01/2017. FINDINGS: The cardiac silhouette remains borderline enlarged. A loop recorder is again demonstrated overlying the upper heart on the left. Mild diffuse peribronchial thickening and accentuation of the interstitial markings is unchanged. The lungs remain mildly hyperexpanded. Mild thoracic spine degenerative changes. IMPRESSION: No acute abnormality. Stable mild  changes of COPD and chronic bronchitis. Electronically Signed   By: Claudie Revering M.D.   On: 06/04/2017 09:05    Time Spent in minutes  30   Lala Lund M.D on 06/05/2017 at 12:10 PM  Between 7am to 7pm - Pager - [phone] ( page via [email or web address], text pages only, please mention full 10 digit call back number). After 7pm go to [email or web address] - password [facility]

## 2017-06-05 NOTE — Evaluation (Signed)
Occupational Therapy Evaluation and Discharge Patient Details Name: Jon [name] MRN: [id number] DOB: [birth year] Today's Date: 06/05/2017    History of Present Illness Jon [name] is a 67 y.o. male with medical history significant for chronic diastolic heart failure, CKD stage 4, status post renal transplant at [facility] on September 2015, on chronic immunosuppressive therapy,(in town followed by Dr. Moshe Cipro, and also followed at [facility]), hypertension, hyperlipidemia, diabetes, OSA on CPAP, history of CVA without residual weakness, presenting to the emergency department with 1 week history of increasing weakness, decreased oral intake, as well as decreased urinary output.   Clinical Impression   This 67 yo male admitted with above presents to acute OT with all education completed with pt and wife, we will D/C from acute OT.    Follow Up Recommendations  No OT follow up;Supervision/Assistance - 24 hour    Equipment Recommendations  None recommended by OT       Precautions / Restrictions Precautions Precautions: Fall Restrictions Weight Bearing Restrictions: No      Mobility Bed Mobility Overal bed mobility: Modified Independent Bed Mobility: Supine to Sit     Supine to sit: Supervision     General bed mobility comments: Supervision for safety  Transfers Overall transfer level: Needs assistance Equipment used: None Transfers: Sit to/from Stand Sit to Stand: Min guard                ADL either performed or assessed with clinical judgement   ADL Overall ADL's : Needs assistance/impaired Eating/Feeding: Independent;Sitting   Grooming: Min guard;Standing   Upper Body Bathing: Set up;Sitting   Lower Body Bathing: Min guard;Sit to/from stand   Upper Body Dressing : Set up;Sitting   Lower Body Dressing: Min guard;Sit to/from stand   Toilet Transfer: Min guard;Ambulation;BSC Toilet Transfer Details (indicate cue type and reason): in  bathroom Toileting- Clothing Manipulation and Hygiene: Min guard;Sit to/from stand   Tub/ Shower Transfer: Walk-in shower;Min guard;3 in 1 Tub/Shower Transfer Details (indicate cue type and reason): pt did sit down hard on 3n1 due to a LOB as he turned to sit down   General ADL Comments: Spoke with pt and wife about at some time they may want to get another 3n1 to use over toilet. Pt already has a reacher, explained to him and wife how he can use it to don underwear and pants. He reports he already uses it to doff socks. Pt and wife are familiar with sock aid. Issued pt a pair of black elastic laces. Verbally cued to use purse lip breathing a couple of times during session. Pt can doff and don socks while seated by crossing one leg over the other; however he tends to hold his breath which goes DOE.     Vision Patient Visual Report: No change from baseline              Pertinent Vitals/Pain Pain Assessment: Faces Faces Pain Scale: Hurts little more Pain Location: back pain Pain Descriptors / Indicators: Discomfort Pain Intervention(s): Monitored during session     Hand Dominance Right   Extremity/Trunk Assessment Upper Extremity Assessment Upper Extremity Assessment: Overall WFL for tasks assessed   Lower Extremity Assessment Lower Extremity Assessment: Generalized weakness       Communication Communication Communication: No difficulties   Cognition Arousal/Alertness: Awake/alert Behavior During Therapy: WFL for tasks assessed/performed Overall Cognitive Status: Within Functional Limits for tasks assessed  Home Living Family/patient expects to be discharged to:: Private residence Living Arrangements: Spouse/significant other Available Help at Discharge: Family;Available 24 hours/day Type of Home: House Home Access: Ramped entrance     Home Layout: Two level;Able to live on main level with  bedroom/bathroom     Bathroom Shower/Tub: Occupational psychologist: Handicapped height     Home Equipment: [place] - single point;Walker - 4 wheels;Bedside commode;Walker - 2 wheels          Prior Functioning/Environment Level of Independence: Needs assistance  Gait / Transfers Assistance Needed: cane prn outside the home; has RW from previous back surgeries, typically doesn't use it ADL's / Homemaking Assistance Needed: ADLs independent (wife sometimes helps him with socks and shoes); wife does IADLs            OT Problem List: Impaired balance (sitting and/or standing);Cardiopulmonary status limiting activity;Obesity      OT Treatment/Interventions:      OT Goals(Current goals can be found in the care plan section) Acute Rehab OT Goals Patient Stated Goal: home tomorrow  OT Frequency:                AM-PAC PT "6 Clicks" Daily Activity     Outcome Measure Help from another person eating meals?: None Help from another person taking care of personal grooming?: A Little Help from another person toileting, which includes using toliet, bedpan, or urinal?: A Little Help from another person bathing (including washing, rinsing, drying)?: A Little Help from another person to put on and taking off regular upper body clothing?: A Little Help from another person to put on and taking off regular lower body clothing?: A Little 6 Click Score: 19   End of Session Equipment Utilized During Treatment: (none)  Activity Tolerance: Patient tolerated treatment well Patient left: in bed;with call bell/phone within reach;with family/visitor present  OT Visit Diagnosis: Muscle weakness (generalized) (M62.81);Pain Pain - part of body: (back)                Time: 1237-1310 OT Time Calculation (min): 33 min Charges:  OT General Charges $OT Visit: 1 Visit OT Evaluation $OT Eval Moderate Complexity: 1 Mod OT Treatments $Self Care/Home Management : 8-22 mins [place],  OTR/L [phone] 06/05/2017

## 2017-06-05 NOTE — Care Management Obs Status (Signed)
[place] NOTIFICATION   Patient Details  Name: Jon [name] MRN: [id number] Date of Birth: 30-Mar-1950   Medicare Observation Status Notification Given:  Yes    Kristen Cardinal, RN 06/05/2017, 1:17 PM

## 2017-06-05 NOTE — Progress Notes (Signed)
Bladder scan showed 25 ml post void.

## 2017-06-05 NOTE — Evaluation (Signed)
Physical Therapy Evaluation Patient Details Name: Jon [name] MRN: [id number] DOB: [birth year] Today's Date: 06/05/2017   History of Present Illness  Jon [name] is a 67 y.o. male with medical history significant for chronic diastolic heart failure, CKD stage 4, status post renal transplant at [facility] on September 2015, on chronic immunosuppressive therapy,(in town followed by Dr. Moshe Cipro, and also followed at [facility]), hypertension, hyperlipidemia, diabetes, OSA on CPAP, history of CVA without residual weakness, presenting to the emergency department with 1 week history of increasing weakness, decreased oral intake, as well as decreased urinary output.  Clinical Impression   Pt admitted with above diagnosis. Pt currently with functional limitations due to the deficits listed below (see PT Problem List). Presents with decr functional capacity, DOE; benefits fro using RW to decr work of walking;  Pt will benefit from skilled PT to increase their independence and safety with mobility to allow discharge to the venue listed below.       Follow Up Recommendations Home health PT    Equipment Recommendations  Rolling walker with 5" wheels;3in1 (PT)    Recommendations for Other Services       Precautions / Restrictions Precautions Precautions: Fall Restrictions Weight Bearing Restrictions: No      Mobility  Bed Mobility Overal bed mobility: Needs Assistance Bed Mobility: Supine to Sit     Supine to sit: Supervision     General bed mobility comments: Supervision for safety  Transfers Overall transfer level: Needs assistance Equipment used: 1 person hand held assist(grab bar in teh bathroom) Transfers: Sit to/from Stand Sit to Stand: Min assist         General transfer comment: min assist to steady initially  Ambulation/Gait Ambulation/Gait assistance: Min guard Ambulation Distance (Feet): 120 Feet Assistive device: None;Rolling walker (2  wheeled) Gait Pattern/deviations: Step-through pattern;Decreased step length - right;Decreased step length - left;Decreased stride length     General Gait Details: initially walk ing without RW and very unsteady, reaching out for UE support; Extremely short of breath walking to and from bathroom; then walked the hallway with RW, and noted better activity tolerance  Stairs            Wheelchair Mobility    Modified Rankin (Stroke Patients Only)       Balance                                             Pertinent Vitals/Pain Pain Assessment: Faces Faces Pain Scale: Hurts little more Pain Location: back pain Pain Descriptors / Indicators: Discomfort Pain Intervention(s): Monitored during session    Home Living Family/patient expects to be discharged to:: Private residence Living Arrangements: Spouse/significant other Available Help at Discharge: Family;Available 24 hours/day Type of Home: House Home Access: Ramped entrance     Home Layout: Two level;Able to live on main level with bedroom/bathroom Home Equipment: Shower seat - built in;Cane - single point;Walker - 4 wheels      Prior Function Level of Independence: Needs assistance   Gait / Transfers Assistance Needed: cane prn outside the home; has RW from previous back surgeries, typically doesn't use it  ADL's / Homemaking Assistance Needed: ADLs independent; wife help with IADLs        Hand Dominance   Dominant Hand: Right    Extremity/Trunk Assessment   Upper Extremity Assessment Upper Extremity Assessment: Generalized weakness  Lower Extremity Assessment Lower Extremity Assessment: Generalized weakness       Communication   Communication: No difficulties  Cognition Arousal/Alertness: Awake/alert Behavior During Therapy: WFL for tasks assessed/performed Overall Cognitive Status: Within Functional Limits for tasks assessed                                         General Comments General comments (skin integrity, edema, etc.): Session conducted on Room Air and noted O2 sats 97-98%    Exercises     Assessment/Plan    PT Assessment Patient needs continued PT services  PT Problem List Decreased strength;Decreased activity tolerance;Decreased balance;Decreased mobility;Decreased knowledge of use of DME;Decreased safety awareness;Decreased knowledge of precautions;Cardiopulmonary status limiting activity       PT Treatment Interventions DME instruction;Gait training;Functional mobility training;Therapeutic activities;Therapeutic exercise;Balance training;Cognitive remediation;Patient/family education;Wheelchair mobility training    PT Goals (Current goals can be found in the Care Plan section)  Acute Rehab PT Goals Patient Stated Goal: Home soon PT Goal Formulation: With patient Time For Goal Achievement: 06/19/17 Potential to Achieve Goals: Good    Frequency Min 3X/week   Barriers to discharge        Co-evaluation               AM-PAC PT "6 Clicks" Daily Activity  Outcome Measure Difficulty turning over in bed (including adjusting bedclothes, sheets and blankets)?: A Little Difficulty moving from lying on back to sitting on the side of the bed? : A Little Difficulty sitting down on and standing up from a chair with arms (e.g., wheelchair, bedside commode, etc,.)?: A Lot Help needed moving to and from a bed to chair (including a wheelchair)?: A Little Help needed walking in hospital room?: A Little Help needed climbing 3-5 steps with a railing? : A Little 6 Click Score: 17    End of Session Equipment Utilized During Treatment: Gait belt Activity Tolerance: Patient tolerated treatment well Patient left: in chair;with call bell/phone within reach Nurse Communication: Mobility status PT Visit Diagnosis: Unsteadiness on feet (R26.81);Muscle weakness (generalized) (M62.81)    Time: 6811-5726 PT Time Calculation (min) (ACUTE  ONLY): 23 min   Charges:   PT Evaluation $PT Eval Moderate Complexity: 1 Mod PT Treatments $Gait Training: 8-22 mins   PT G Codes:        Roney Marion, PT  Acute Rehabilitation Services Pager [phone] Office [phone]   Colletta [place] 06/05/2017, 11:43 AM

## 2017-06-05 NOTE — Care Management Note (Addendum)
Case Management Note  Patient Details  Name: Jon [name] MRN: [id number] Date of Birth: 07/25/1950  Subjective/Objective:     Admitted for Acute Renal Failure.  PCP noted.             Action/Plan: In to speak with patient, wife at bedside, permission given by patient to speak in front of wife.  NCM discussed recommendation for [place] at discharge.  Patient states he has never used Home Health before, provided home health provider list to patient.  Patient/wife selected [facility].  Face sheet, H & P and PT orders faxed to [facility] [phone].  Attending notified patient needs home health orders with face-to-face, per Dr. Candiss Norse will write orders when patient discharges tomorrow.  DME discussed with patient/wife, requested DME: 3-in-1. Referral for DME: 3-in-1 called to discharge liaison Jeneen Rinks. NCM will continue to follow for discharge transition needs.  Expected Discharge Date:    06/06/2017             Expected Discharge Plan:  [place]  In-House Referral:  Clinical Social Work  Discharge planning Services  CM Consult  Post Acute Care Choice:  Durable Medical Equipment Choice offered to:  Patient, Spouse  DME Arranged:  3-N-1 DME Agency:  [place]:    PT [place]:   [facility]  Status of Service:  In process, will continue to follow  Kristen Cardinal, RN 06/05/2017, 1:32 PM

## 2017-06-06 DIAGNOSIS — N184 Chronic kidney disease, stage 4 (severe): Secondary | ICD-10-CM | POA: Diagnosis not present

## 2017-06-06 DIAGNOSIS — N179 Acute kidney failure, unspecified: Secondary | ICD-10-CM | POA: Diagnosis not present

## 2017-06-06 DIAGNOSIS — N170 Acute kidney failure with tubular necrosis: Secondary | ICD-10-CM | POA: Diagnosis not present

## 2017-06-06 DIAGNOSIS — K219 Gastro-esophageal reflux disease without esophagitis: Secondary | ICD-10-CM | POA: Diagnosis not present

## 2017-06-06 DIAGNOSIS — G4733 Obstructive sleep apnea (adult) (pediatric): Secondary | ICD-10-CM | POA: Diagnosis not present

## 2017-06-06 LAB — HIV ANTIBODY (ROUTINE TESTING W REFLEX): HIV SCREEN 4TH GENERATION: NONREACTIVE

## 2017-06-06 LAB — GLUCOSE, CAPILLARY: Glucose-Capillary: 143 mg/dL — ABNORMAL HIGH (ref 65–99)

## 2017-06-06 MED ORDER — FUROSEMIDE 80 MG PO TABS: 80.0000 mg | ORAL_TABLET | Freq: Two times a day (BID) | ORAL | 0 refills | Status: DC

## 2017-06-06 MED ORDER — AZITHROMYCIN 250 MG PO TABS: 250.0000 mg | ORAL_TABLET | Freq: Every day | ORAL | 0 refills | Status: AC

## 2017-06-06 MED ORDER — AZITHROMYCIN 500 MG PO TABS
500.0000 mg | ORAL_TABLET | Freq: Once | ORAL | Status: AC
  Administered 2017-06-06: 500 mg via ORAL
  Filled 2017-06-06: qty 1

## 2017-06-06 NOTE — Discharge Instructions (Signed)
Follow with Primary MD Jacqlyn Larsen II, MD in 7 days , resume your Bactrim on coming Monday do not take it this week.  Get CBC, CMP, 2 view Chest X ray checked  by Primary MD  in 5-7 days   Activity: As tolerated with Full fall precautions use walker/cane & assistance as needed  Disposition Home    Diet:   Heart healthy low carbohydrate diet  For Heart failure patients - Check your Weight same time everyday, if you gain over 2 pounds, or you develop in leg swelling, experience more shortness of breath or chest pain, call your Primary MD immediately. Follow Cardiac Low Salt Diet and 1.5 lit/day fluid restriction.  Special Instructions: If you have smoked or chewed Tobacco  in the last 2 yrs please stop smoking, stop any regular Alcohol  and or any Recreational drug use.  On your next visit with your primary care physician please Get Medicines reviewed and adjusted.  Please request your Prim.MD to go over all Hospital Tests and Procedure/Radiological results at the follow up, please get all Hospital records sent to your Prim MD by signing hospital release before you go home.  If you experience worsening of your admission symptoms, develop shortness of breath, life threatening emergency, suicidal or homicidal thoughts you must seek medical attention immediately by calling 911 or calling your MD immediately  if symptoms less severe.  You Must read complete instructions/literature along with all the possible adverse reactions/side effects for all the Medicines you take and that have been prescribed to you. Take any new Medicines after you have completely understood and accpet all the possible adverse reactions/side effects.

## 2017-06-06 NOTE — Progress Notes (Signed)
Patient discharged to Home with wife . After visit Summary reviewed. Patient capable of reverbalizing medications and follow up visits. No signs and symptoms of distress noted. Patient educated to return to the ED in the case of an emergency. Dillon Bjork RN

## 2017-06-06 NOTE — Progress Notes (Signed)
Physical Therapy Treatment Patient Details Name: Jon [name] MRN: [id number] DOB: June 26, 1950 Today's Date: 06/06/2017    History of Present Illness Jon [name] is a 67 y.o. male with medical history significant for chronic diastolic heart failure, CKD stage 4, status post renal transplant at [facility] on September 2015, on chronic immunosuppressive therapy,(in town followed by Dr. Moshe Cipro, and also followed at [facility]), hypertension, hyperlipidemia, diabetes, OSA on CPAP, history of CVA without residual weakness, presenting to the emergency department with 1 week history of increasing weakness, decreased oral intake, as well as decreased urinary output.    PT Comments    Continuing work on functional mobility and activity tolerance, with notable improvements today with hallway amb; OK for dc home from PT standpoint    Follow Up Recommendations  Home health PT     Equipment Recommendations  Rolling walker with 5" wheels;3in1 (PT)    Recommendations for Other Services       Precautions / Restrictions Precautions Precautions: Fall Precaution Comments: Fall risk greatly reduced with use of assistive devoce    Mobility  Bed Mobility Overal bed mobility: Modified Independent Bed Mobility: Supine to Sit     Supine to sit: Modified independent (Device/Increase time)     General bed mobility comments: Incr tiem  Transfers Overall transfer level: Needs assistance Equipment used: None Transfers: Sit to/from Stand Sit to Stand: Supervision         General transfer comment: Cues to self-monitor for activity tolerance  Ambulation/Gait Ambulation/Gait assistance: Supervision;Modified independent (Device/Increase time) Ambulation Distance (Feet): 200 Feet Assistive device: Rolling walker (2 wheeled) Gait Pattern/deviations: Step-through pattern;Decreased step length - right;Decreased step length - left;Decreased stride length     General Gait  Details: Hallway ambulation with RW and monitored O2 sats throughout; walked on Room Air and sats stayed greater than or equal to 94%; much imporved activity tolerance   Stairs             Wheelchair Mobility    Modified Rankin (Stroke Patients Only)       Balance                                            Cognition Arousal/Alertness: Awake/alert Behavior During Therapy: WFL for tasks assessed/performed Overall Cognitive Status: Within Functional Limits for tasks assessed                                        Exercises      General Comments        Pertinent Vitals/Pain Pain Assessment: No/denies pain    Home Living                      Prior Function            PT Goals (current goals can now be found in the care plan section) Acute Rehab PT Goals Patient Stated Goal: hopeful to go home today PT Goal Formulation: With patient Time For Goal Achievement: 06/19/17 Potential to Achieve Goals: Good Progress towards PT goals: Progressing toward goals    Frequency    Min 3X/week      PT Plan Current plan remains appropriate    Co-evaluation  AM-PAC PT "6 Clicks" Daily Activity  Outcome Measure  Difficulty turning over in bed (including adjusting bedclothes, sheets and blankets)?: A Little Difficulty moving from lying on back to sitting on the side of the bed? : A Little Difficulty sitting down on and standing up from a chair with arms (e.g., wheelchair, bedside commode, etc,.)?: A Little Help needed moving to and from a bed to chair (including a wheelchair)?: None Help needed walking in hospital room?: None Help needed climbing 3-5 steps with a railing? : A Little 6 Click Score: 20    End of Session Equipment Utilized During Treatment: Gait belt Activity Tolerance: Patient tolerated treatment well Patient left: in bed;with call bell/phone within reach;with family/visitor  present Nurse Communication: Mobility status PT Visit Diagnosis: Unsteadiness on feet (R26.81);Muscle weakness (generalized) (M62.81)     Time: 7741-2878 PT Time Calculation (min) (ACUTE ONLY): 11 min  Charges:  $Gait Training: 8-22 mins                    G Codes:       Roney Marion, [place]  Acute Rehabilitation Services Pager [phone] Office [place] 06/06/2017, 11:58 AM

## 2017-06-06 NOTE — Discharge Summary (Signed)
Jon [name] AJO:[id number] DOB: 11-Aug-1950 DOA: 06/04/2017  PCP: Jon Sheriff, MD  Admit date: 06/04/2017  Discharge date: 06/06/2017  Admitted From: Home   Disposition:  Home   Recommendations for Outpatient Follow-up:   Follow up with PCP in 1-2 weeks  PCP Please obtain BMP/CBC, 2 view CXR in 1week,  (see Discharge instructions)   PCP Please follow up on the following pending results: None   Home Health: RN,PT   Equipment/Devices: None  Consultations: None Discharge Condition: Stable   CODE STATUS: Full   Diet Recommendation: Heart healthy, low carbohydrate   Chief Complaint  Patient presents with  . Chest Pain  . Urinary Retention     Brief history of present illness from the day of admission and additional interim summary    Jon [name] a 67 y.o.malewith medical history significant forchronic diastolic heart failure,CKD stage 4,status post renal transplant at [facility] on September 2015, on chronic immunosuppressive therapy with Prograf, Myfortic, and Bactrim for prophylaxis,prednisone, in town followed by Jon [name], and also followed at [facility], hypertension, hyperlipidemia, diabetes, OSA on CPAP, history of CVA without residual weakness, presenting to the emergency department with 1 week history of increasing weakness, decreased oral intake, as well as decreased urinary output.  In the ER he also had some atypical chest discomfort after over exertion and lifting weights recently, he was diagnosed with acute renal failure, dehydration with musculoskeletal chest discomfort.                                                                   Hospital Course    1.  Acute renal failure on CKD stage V.  History of renal transplant in 2015 on combination of  tacrolimus, Myfortic, prednisone and 3 times a week Bactrim.  Recent URI with antibiotic exposure, also on baseline high-dose diuretics.  Condition of above causing dehydration and acute renal failure, with holding offending medications and gentle IV fluids his renal function is back to baseline.His IV fluids were stopped yesterday he has now been placed on Lasix 80 twice daily which is lower than his home dose.  His renal function is back to baseline he wants to go home and will be discharged with PCP and nephrology follow-up within a week of discharge.  Kindly monitor his weight, diuretic dose and BMP closely.  2.  Atypical muscular skeletal chest pain.  Echo from December 2018 showing a EF of 60% with grade 2 chronic diastolic CHF.  His chest pain has been there off and on for several days, but was exposed to with lifting heavy weights and multiple coughing spells recently, completely pain-free now, EKG unremarkable, troponin negative.  No further work-up inpatient at this time.  3.  Hypertension.  Continue combination of labetalol along with  low-dose Lasix for now.  4.  Dysipidemia.  On statin.  5.  GERD.  On PPI continue.  6.  BPH.  On Flomax continue.    7.  Generalized weakness and deconditioning.  PT OT, encouraged to advance activity.    8. DM type [name].    Renew home regimen unchanged.  9.  Possible URI.  Chest x-ray clear, his main complaint is a sore throat and a dry cough which has been present for now close to 10 days, could have atypical bronchitis, will give azithromycin here in 3 more days of the same upon discharge, requested to hold his Bactrim for the next 4 days until he finishes his azithromycin.   Discharge diagnosis     Principal Problem:   Acute renal failure superimposed on stage 4 chronic kidney disease (HCC) Active Problems:   Diabetes mellitus ([place])   History of kidney transplant   Chronic back pain   Lumbar stenosis with neurogenic claudication    History of stroke   HLD (hyperlipidemia)   OSA (obstructive sleep apnea)   GERD (gastroesophageal reflux disease)    Discharge instructions    Discharge Instructions    Discharge instructions   Complete by:  As directed    Follow with Primary MD Jon Larsen II, MD in 7 days , resume your Bactrim on coming Monday do not take it this week.  Get CBC, CMP, 2 view Chest X ray checked  by Primary MD  in 5-7 days   Activity: As tolerated with Full fall precautions use walker/cane & assistance as needed  Disposition Home    Diet:   Heart healthy low carbohydrate diet  For Heart failure patients - Check your Weight same time everyday, if you gain over 2 pounds, or you develop in leg swelling, experience more shortness of breath or chest pain, call your Primary MD immediately. Follow Cardiac Low Salt Diet and 1.5 lit/day fluid restriction.  Special Instructions: If you have smoked or chewed Tobacco  in the last 2 yrs please stop smoking, stop any regular Alcohol  and or any Recreational drug use.  On your next visit with your primary care physician please Get Medicines reviewed and adjusted.  Please request your Prim.MD to go over all Hospital Tests and Procedure/Radiological results at the follow up, please get all Hospital records sent to your Prim MD by signing hospital release before you go home.  If you experience worsening of your admission symptoms, develop shortness of breath, life threatening emergency, suicidal or homicidal thoughts you must seek medical attention immediately by calling 911 or calling your MD immediately  if symptoms less severe.  You Must read complete instructions/literature along with all the possible adverse reactions/side effects for all the Medicines you take and that have been prescribed to you. Take any new Medicines after you have completely understood and accpet all the possible adverse reactions/side effects.   Increase activity slowly   Complete by:   As directed       Discharge Medications   Allergies as of 06/06/2017      Reactions   Lisinopril Other (See Comments)   Increased potassium level   Meloxicam Nausea And Vomiting   Victoza [liraglutide] Diarrhea, Nausea And Vomiting, Swelling      Medication List    STOP taking these medications   amoxicillin-clavulanate 500-125 MG tablet Commonly known as:  AUGMENTIN     TAKE these medications   aspirin EC 81 MG tablet Take 81 mg by  mouth at bedtime.   atorvastatin 40 MG tablet Commonly known as:  LIPITOR Take 40 mg by mouth at bedtime.   azithromycin 250 MG tablet Commonly known as:  ZITHROMAX Take 1 tablet (250 mg total) by mouth daily for 3 days.   calcitRIOL 0.25 MCG capsule Commonly known as:  ROCALTROL Take 0.25 mcg by mouth See admin instructions. Take 1 capsule (0.25 mcg) by mouth five times weekly - Monday thru Friday   furosemide 80 MG tablet Commonly known as:  LASIX Take 1 tablet (80 mg total) by mouth 2 (two) times daily. What changed:    how much to take  when to take this   gabapentin 300 MG capsule Commonly known as:  NEURONTIN Take 300 mg by mouth 2 (two) times daily.   HUMALOG KWIKPEN 100 UNIT/ML KiwkPen Generic drug:  insulin lispro Inject 5-15 Units into the skin See admin instructions. Inject 5 units subcutaneously three times daily before meals adjusted per carb count - Jon 2 units for every 50 carbs to be consumed   HYDROcodone-acetaminophen 10-325 MG tablet Commonly known as:  NORCO Take 1-2 tablets by mouth every 6 (six) hours as needed (pain).   insulin glargine 100 unit/mL Sopn Commonly known as:  LANTUS Inject 48 Units into the skin at bedtime.   labetalol 300 MG tablet Commonly known as:  NORMODYNE Take 300 mg by mouth 2 (two) times daily.   magnesium oxide 400 MG tablet Commonly known as:  MAG-OX Take 400 mg by mouth daily.   multivitamin with minerals Tabs tablet Take 1 tablet by mouth daily.   mycophenolate 360  MG Tbec EC tablet Commonly known as:  MYFORTIC Take 360 mg by mouth 2 (two) times daily.   omeprazole 20 MG capsule Commonly known as:  PRILOSEC Take 20 mg by mouth daily.   ONE TOUCH ULTRA 2 w/Device Kit   Potassium Citrate 15 MEQ (1620 MG) Tbcr Take 1 tablet by mouth 3 (three) times daily.   predniSONE 5 MG tablet Commonly known as:  DELTASONE Take 5 mg by mouth daily with breakfast.   sulfamethoxazole-trimethoprim 400-80 MG tablet Commonly known as:  BACTRIM,SEPTRA Take 1 tablet by mouth every Monday, Wednesday, and Friday.   tacrolimus 0.5 MG capsule Commonly known as:  PROGRAF Take 2 mg by mouth 2 (two) times daily.   tamsulosin 0.4 MG Caps capsule Commonly known as:  FLOMAX Take 0.4 mg by mouth daily after breakfast.   vitamin B-12 1000 MCG tablet Commonly known as:  CYANOCOBALAMIN Take 1,000 mcg by mouth 2 (two) times daily.            Durable Medical Equipment  (From admission, onward)        Start     Ordered   06/05/17 1323  For home use only DME 3 n 1  Once     06/05/17 1322      Follow-up Information    Jon Sheriff, MD Follow up.   Specialty:  Family Medicine Contact information: [place] [address] [place] Follow up.   Why:  Arranged for 3-in-1 to be delivered to patient room prior to discharge. Contact information: [address] [place] [address] [place] Follow up.   Specialty:  Home Health Services Why:  They will call to set up initial visit Contact information: [address] [place] [address] [phone]        Corliss Parish, MD. Schedule an appointment as soon as possible for a visit in 1 week(s).   Specialty:  Nephrology Contact information: [place] [address] [phone]           Major procedures and Radiology Reports - PLEASE review detailed and final reports  thoroughly  -        Dg Chest 2 View  Result Date: 06/04/2017 CLINICAL DATA:  Chest pain, cough and shortness of breath. Ex-smoker. EXAM: CHEST - 2 VIEW COMPARISON:  01/01/2017. FINDINGS: The cardiac silhouette remains borderline enlarged. A loop recorder is again demonstrated overlying the upper heart on the left. Mild diffuse peribronchial thickening and accentuation of the interstitial markings is unchanged. The lungs remain mildly hyperexpanded. Mild thoracic spine degenerative changes. IMPRESSION: No acute abnormality. Stable mild changes of COPD and chronic bronchitis. Electronically Signed   By: Claudie Revering M.D.   On: 06/04/2017 09:05    Micro Results    No results found for this or any previous visit (from the past 240 hour(s)).  Today   Subjective    Jon [name] today has no headache,no chest abdominal pain,no new weakness tingling or numbness, feels much better wants to go home today.    Objective   Blood pressure (!) 128/50, pulse 81, temperature 99.1 F (37.3 C), temperature source Oral, resp. rate (!) 24, height '5\' 11"'  (1.803 m), weight 108 kg (238 lb 1.1 oz), SpO2 93 %.   Intake/Output Summary (Last 24 hours) at 06/06/2017 1032 Last data filed at 06/06/2017 0952 Gross per 24 hour  Intake 954 ml  Output 501 ml  Net 453 ml    Exam Awake Alert, Oriented x 3, No new F.N deficits, Normal affect [place].AT,PERRAL Supple Neck,No JVD, No cervical lymphadenopathy appriciated.  Symmetrical Chest wall movement, Good air movement bilaterally, CTAB RRR,No Gallops,Rubs or new Murmurs, No Parasternal Heave +ve B.Sounds, Abd Soft, Non tender, No organomegaly appriciated, No rebound -guarding or rigidity. No Cyanosis, Clubbing or edema, No new Rash or bruise   Data Review   CBC w Diff:  Lab Results  Component Value Date   WBC 6.8 06/05/2017   HGB 12.6 (L) 06/05/2017   HCT 38.5 (L) 06/05/2017   PLT 109 (L) 06/05/2017   LYMPHOPCT 14 01/02/2017   MONOPCT 5 01/02/2017    EOSPCT 1 01/02/2017   BASOPCT 0 01/02/2017    CMP:  Lab Results  Component Value Date   NA 139 06/05/2017   K 4.3 06/05/2017   CL 108 06/05/2017   CO2 23 06/05/2017   BUN 47 (H) 06/05/2017   CREATININE 2.60 (H) 06/05/2017   CREATININE 2.21 (H) 12/22/2016   PROT 7.0 01/01/2017   ALBUMIN 3.7 01/04/2017   BILITOT 0.5 01/01/2017   ALKPHOS 106 01/01/2017   AST 28 01/01/2017   ALT 31 01/01/2017  .   Total Time in preparing paper work, data evaluation and todays exam - 2 minutes  Lala Lund M.D on 06/06/2017 at 10:32 AM  [facility]   Office  [phone]

## 2017-06-07 DIAGNOSIS — I509 Heart failure, unspecified: Secondary | ICD-10-CM | POA: Diagnosis not present

## 2017-06-07 DIAGNOSIS — M1991 Primary osteoarthritis, unspecified site: Secondary | ICD-10-CM | POA: Diagnosis not present

## 2017-06-07 DIAGNOSIS — M545 Low back pain: Secondary | ICD-10-CM | POA: Diagnosis not present

## 2017-06-07 DIAGNOSIS — Z8673 Personal history of transient ischemic attack (TIA), and cerebral infarction without residual deficits: Secondary | ICD-10-CM | POA: Diagnosis not present

## 2017-06-07 DIAGNOSIS — I130 Hypertensive heart and chronic kidney disease with heart failure and stage 1 through stage 4 chronic kidney disease, or unspecified chronic kidney disease: Secondary | ICD-10-CM | POA: Diagnosis not present

## 2017-06-07 DIAGNOSIS — K219 Gastro-esophageal reflux disease without esophagitis: Secondary | ICD-10-CM | POA: Diagnosis not present

## 2017-06-07 DIAGNOSIS — N184 Chronic kidney disease, stage 4 (severe): Secondary | ICD-10-CM | POA: Diagnosis not present

## 2017-06-07 DIAGNOSIS — Z940 Kidney transplant status: Secondary | ICD-10-CM | POA: Diagnosis not present

## 2017-06-07 DIAGNOSIS — M48062 Spinal stenosis, lumbar region with neurogenic claudication: Secondary | ICD-10-CM | POA: Diagnosis not present

## 2017-06-07 DIAGNOSIS — Z794 Long term (current) use of insulin: Secondary | ICD-10-CM | POA: Diagnosis not present

## 2017-06-07 DIAGNOSIS — Z7952 Long term (current) use of systemic steroids: Secondary | ICD-10-CM | POA: Diagnosis not present

## 2017-06-07 DIAGNOSIS — G4733 Obstructive sleep apnea (adult) (pediatric): Secondary | ICD-10-CM | POA: Diagnosis not present

## 2017-06-07 DIAGNOSIS — G8929 Other chronic pain: Secondary | ICD-10-CM | POA: Diagnosis not present

## 2017-06-07 DIAGNOSIS — E1122 Type 2 diabetes mellitus with diabetic chronic kidney disease: Secondary | ICD-10-CM | POA: Diagnosis not present

## 2017-06-09 DIAGNOSIS — N179 Acute kidney failure, unspecified: Secondary | ICD-10-CM | POA: Diagnosis not present

## 2017-06-09 DIAGNOSIS — E1165 Type 2 diabetes mellitus with hyperglycemia: Secondary | ICD-10-CM | POA: Diagnosis not present

## 2017-06-09 DIAGNOSIS — N189 Chronic kidney disease, unspecified: Secondary | ICD-10-CM | POA: Diagnosis not present

## 2017-06-14 DIAGNOSIS — Z940 Kidney transplant status: Secondary | ICD-10-CM | POA: Diagnosis not present

## 2017-06-18 ENCOUNTER — Emergency Department (HOSPITAL_COMMUNITY): Payer: Medicare Other

## 2017-06-18 ENCOUNTER — Emergency Department (HOSPITAL_COMMUNITY)
Admission: EM | Admit: 2017-06-18 | Discharge: 2017-06-18 | Disposition: A | Discharge status: Home / Self Care | Payer: Medicare Other | Attending: Emergency Medicine | Admitting: Emergency Medicine

## 2017-06-18 ENCOUNTER — Other Ambulatory Visit: Payer: Self-pay

## 2017-06-18 ENCOUNTER — Encounter (HOSPITAL_COMMUNITY): Payer: Self-pay | Admitting: Emergency Medicine

## 2017-06-18 DIAGNOSIS — E119 Type 2 diabetes mellitus without complications: Secondary | ICD-10-CM | POA: Diagnosis not present

## 2017-06-18 DIAGNOSIS — Z87891 Personal history of nicotine dependence: Secondary | ICD-10-CM | POA: Insufficient documentation

## 2017-06-18 DIAGNOSIS — I129 Hypertensive chronic kidney disease with stage 1 through stage 4 chronic kidney disease, or unspecified chronic kidney disease: Secondary | ICD-10-CM | POA: Diagnosis not present

## 2017-06-18 DIAGNOSIS — K760 Fatty (change of) liver, not elsewhere classified: Secondary | ICD-10-CM | POA: Diagnosis not present

## 2017-06-18 DIAGNOSIS — Z7982 Long term (current) use of aspirin: Secondary | ICD-10-CM | POA: Diagnosis not present

## 2017-06-18 DIAGNOSIS — N184 Chronic kidney disease, stage 4 (severe): Secondary | ICD-10-CM | POA: Diagnosis not present

## 2017-06-18 DIAGNOSIS — Z794 Long term (current) use of insulin: Secondary | ICD-10-CM | POA: Diagnosis not present

## 2017-06-18 DIAGNOSIS — R531 Weakness: Secondary | ICD-10-CM

## 2017-06-18 DIAGNOSIS — Z79899 Other long term (current) drug therapy: Secondary | ICD-10-CM | POA: Diagnosis not present

## 2017-06-18 DIAGNOSIS — R05 Cough: Secondary | ICD-10-CM | POA: Diagnosis not present

## 2017-06-18 DIAGNOSIS — N3001 Acute cystitis with hematuria: Secondary | ICD-10-CM

## 2017-06-18 LAB — URINALYSIS, ROUTINE W REFLEX MICROSCOPIC
BILIRUBIN URINE: NEGATIVE
Glucose, UA: NEGATIVE mg/dL
Ketones, ur: NEGATIVE mg/dL
LEUKOCYTES UA: NEGATIVE
NITRITE: NEGATIVE
Protein, ur: 30 mg/dL — AB
SPECIFIC GRAVITY, URINE: 1.021 (ref 1.005–1.030)
pH: 5 (ref 5.0–8.0)

## 2017-06-18 LAB — CBC
HEMATOCRIT: 41.3 % (ref 39.0–52.0)
HEMOGLOBIN: 13.8 g/dL (ref 13.0–17.0)
MCH: 29.9 pg (ref 26.0–34.0)
MCHC: 33.4 g/dL (ref 30.0–36.0)
MCV: 89.6 fL (ref 78.0–100.0)
Platelets: 218 10*3/uL (ref 150–400)
RBC: 4.61 MIL/uL (ref 4.22–5.81)
RDW: 12.6 % (ref 11.5–15.5)
WBC: 8.6 10*3/uL (ref 4.0–10.5)

## 2017-06-18 LAB — BASIC METABOLIC PANEL
ANION GAP: 9 (ref 5–15)
BUN: 38 mg/dL — AB (ref 6–20)
CHLORIDE: 104 mmol/L (ref 101–111)
CO2: 23 mmol/L (ref 22–32)
Calcium: 9.8 mg/dL (ref 8.9–10.3)
Creatinine, Ser: 2.57 mg/dL — ABNORMAL HIGH (ref 0.61–1.24)
GFR, EST AFRICAN AMERICAN: 28 mL/min — AB (ref >60)
GFR, EST NON AFRICAN AMERICAN: 24 mL/min — AB (ref >60)
Glucose, Bld: 99 mg/dL (ref 65–99)
POTASSIUM: 4.4 mmol/L (ref 3.5–5.1)
SODIUM: 136 mmol/L (ref 135–145)

## 2017-06-18 LAB — HEPATIC FUNCTION PANEL
ALT: 39 U/L (ref 17–63)
AST: 29 U/L (ref 15–41)
Albumin: 4 g/dL (ref 3.5–5.0)
Alkaline Phosphatase: 77 U/L (ref 38–126)
BILIRUBIN DIRECT: 0.2 mg/dL (ref 0.1–0.5)
BILIRUBIN TOTAL: 1.1 mg/dL (ref 0.3–1.2)
Indirect Bilirubin: 0.9 mg/dL (ref 0.3–0.9)
Total Protein: 7 g/dL (ref 6.5–8.1)

## 2017-06-18 LAB — I-STAT TROPONIN, ED: Troponin i, poc: 0.01 ng/mL (ref 0.00–0.08)

## 2017-06-18 LAB — LIPASE, BLOOD: Lipase: 30 U/L (ref 11–51)

## 2017-06-18 LAB — CBG MONITORING, ED: GLUCOSE-CAPILLARY: 102 mg/dL — AB (ref 65–99)

## 2017-06-18 LAB — BRAIN NATRIURETIC PEPTIDE: B Natriuretic Peptide: 131.8 pg/mL — ABNORMAL HIGH (ref 0.0–100.0)

## 2017-06-18 MED ORDER — DOXYCYCLINE HYCLATE 100 MG PO CAPS: 100.0000 mg | ORAL_CAPSULE | Freq: Two times a day (BID) | ORAL | 0 refills | Status: AC

## 2017-06-18 MED ORDER — SODIUM CHLORIDE 0.9 % IV BOLUS
500.0000 mL | Freq: Once | INTRAVENOUS | Status: AC
Start: 2017-06-18 — End: 2017-06-18
  Administered 2017-06-18: 500 mL via INTRAVENOUS

## 2017-06-18 MED ORDER — SODIUM CHLORIDE 0.9 % IV BOLUS
500.0000 mL | Freq: Once | INTRAVENOUS | Status: AC
  Administered 2017-06-18: 500 mL via INTRAVENOUS

## 2017-06-18 MED ORDER — LIDOCAINE HCL URETHRAL/MUCOSAL 2 % EX GEL: 1.0000 "application " | Freq: Once | CUTANEOUS | Status: DC

## 2017-06-18 NOTE — ED Notes (Signed)
Pt's UO 1700 last Tuesday, yesterday 600.  Thursday 400.  Wife keeps a great written record.

## 2017-06-18 NOTE — ED Notes (Signed)
Pt aware of need for urine sample, urinal and call light at bedside

## 2017-06-18 NOTE — ED Notes (Signed)
This Rn attempted I&O cath, could not advance catheter to bladder, EDP made aware, bladder scan read 121ml

## 2017-06-18 NOTE — ED Triage Notes (Signed)
Pt presents to ED for assessment of continuing decreased appetite and decreased thirst.  Patient not eating or drinking, dealing with sore throat and cough.  Pt on lasix, but wife states she took him off Wednesday due to decreased intake.  States they measure his urine and intake daily, ptr has been drinking less than liter per day x 3 days, decrease UO.  Pt recently admitted for same.  Pt seen at [name] earlier and states creat increasing.

## 2017-06-18 NOTE — ED Notes (Signed)
Patient transported to X-ray 

## 2017-06-18 NOTE — ED Notes (Signed)
Main lab to add on hepatic function panel and lipase

## 2017-06-18 NOTE — ED Provider Notes (Signed)
[facility] Provider Note   CSN: [id number] Arrival date & time: 06/18/17  1131     History   Chief Complaint Chief Complaint  Patient presents with  . Weakness    HPI BLESS BELSHE is a 67 y.o. male with a past medical history of stroke, CVA, DM 2, status post kidney transplant, hypertension, who presents today for evaluation of weakness.  He was recently admitted to the hospital from 5/26 to 5/29 for a similar weakness.  At that time he was found to have an AKI that resolved.  He reports that initially he was able to keep up with recommended fluid intake, however over the past 3 days he has had no appetite.  He forces himself to drink occasional sips of water, however reports that he is having decreased p.o. intake.  He has no appetite.  He denies nausea, abdominal pain, chest pain, or shortness of breath.  He does report continued sore throat and cough that have been present for almost approximately 1 month.  Yesterday he was only able to have a few sips of water, and drink half of a protein shake.  He has not taken any of his Lasix since Wednesday.  He reports compliance with all of his other medications.  His primary nephrologist is Dr. Moshe Cipro with [facility].  HPI  Past Medical History:  Diagnosis Date  . Arthritis    back, hands   . Chronic back pain    radiculopathy and stenosis  . Chronic kidney disease    [name], awaiting Transplant   . CVA (cerebral infarction) 03/2013  . Diabetes mellitus     Lantus nightly ;type 2  . GERD (gastroesophageal reflux disease)   . H/O hiatal hernia   . History of colon polyps   . Hx of cardiovascular stress test    a.  Lexiscan Myoview (05/2013):  No ischemia, EF 53%; Normal Study  . Hyperlipidemia    takes Lovastatin daily  . Hypertension    takes Amlodipine and Catapres daily    dr Forrestine Him, Loop Recorder - [facility]  . Nocturia   . Peripheral edema    takes Lasix daily  .  Sleep apnea    cpap , study in their home, 09/2102- [facility]           . Stroke (HCC)    speech, rt arm weakness  . Urinary frequency     Patient Active Problem List   Diagnosis Date Noted  . Acute renal failure superimposed on stage 4 chronic kidney disease ([place]) 06/04/2017  . GERD (gastroesophageal reflux disease) 06/04/2017  . Peripheral edema 01/02/2017  . Finger infection, fungal 11/22/2016  . Dyspnea on exertion 08/24/2016  . HLD (hyperlipidemia) 05/02/2013  . OSA (obstructive sleep apnea) 05/02/2013  . History of stroke 03/16/2013  . Movement disorder 03/16/2013  . Lumbar stenosis with neurogenic claudication 12/27/2012  . Chronic back pain 01/27/2011  . HTN (hypertension) 01/27/2011  . History of kidney transplant 01/24/2011  . Hyperkalemia 01/23/2011  . Diabetes mellitus ([place]) 01/23/2011    Past Surgical History:  Procedure Laterality Date  . AV FISTULA PLACEMENT Left 04/03/2013   Procedure: ARTERIOVENOUS (AV) FISTULA CREATION- LEFT RADIOCEPHALIC VS BRACHIOCEPHALIC;  Surgeon: Conrad Oketo, MD;  Location: [facility];  Service: Vascular;  Laterality: Left;  . AV FISTULA PLACEMENT Left 05/14/2013   Procedure: LEFT BRACHIOCEPHALIC ARTERIOVENOUS (AV) FISTULA CREATION;  Surgeon: Conrad [place] Creek, MD;  Location: [facility];  Service: Vascular;  Laterality: Left;  . BACK SURGERY  12/2012   2003- 1st back surgery & then 2014- fusion  . COLONOSCOPY    . ESOPHAGOGASTRODUODENOSCOPY    . HEMORRHOID SURGERY    . KIDNEY TRANSPLANT  10/01/2013  . left knee surgery    . LOOP RECORDER IMPLANT  03/19/13   MDT LinQ implanted by Dr Rayann Heman for cryptogenic stroke  . LOOP RECORDER IMPLANT N/A 03/19/2013   Procedure: LOOP RECORDER IMPLANT;  Surgeon: Coralyn Mark, MD;  Location: [facility] CATH LAB;  Service: Cardiovascular;  Laterality: N/A;  . NEPHRECTOMY TRANSPLANTED ORGAN    . NEUROPLASTY / TRANSPOSITION MEDIAN NERVE AT CARPAL TUNNEL BILATERAL    . right leg surgery     pin in place  . right wrist surgery      . TEE WITHOUT CARDIOVERSION N/A 03/19/2013   Procedure: TRANSESOPHAGEAL ECHOCARDIOGRAM (TEE);  Surgeon: [name], MD;  Location: [facility] ENDOSCOPY;  Service: Cardiovascular;  Laterality: N/A;        Home Medications    Prior to Admission medications   Medication Sig Start Date End Date Taking? Authorizing Provider  aspirin EC 81 MG tablet Take 81 mg by mouth at bedtime.   Yes [provider]  atorvastatin (LIPITOR) 40 MG tablet Take 40 mg by mouth at bedtime.    Yes [provider]  calcitRIOL (ROCALTROL) 0.25 MCG capsule Take 0.25 mcg by mouth See admin instructions. Take 1 capsule (0.25 mcg) by mouth five times weekly - Monday thru Friday   Yes [provider]  Dextromethorphan-guaiFENesin (CORICIDIN HBP CONGESTION/COUGH) 10-200 MG CAPS Take 2 tablets by mouth every 4 (four) hours. Coricidin   Yes [provider]  Dextromethorphan-guaiFENesin (ROBITUSSIN DM PO) Take 10 mLs by mouth every 12 (twelve) hours as needed (cough). Extended releif   Yes [provider]  furosemide (LASIX) 80 MG tablet Take 1 tablet (80 mg total) by mouth 2 (two) times daily. 06/06/17  Yes Thurnell Lose, MD  gabapentin (NEURONTIN) 300 MG capsule Take 300 mg by mouth 2 (two) times daily.   Yes [provider]  HYDROcodone-acetaminophen (NORCO) 10-325 MG tablet Take 1-2 tablets by mouth every 6 (six) hours as needed (pain).  12/26/16  Yes [provider]  insulin glargine (LANTUS) 100 unit/mL SOPN Inject 48 Units into the skin at bedtime.   Yes [provider]  insulin lispro (HUMALOG KWIKPEN) 100 UNIT/ML KiwkPen Inject 5-15 Units into the skin See admin instructions. Inject 5 units subcutaneously three times daily before meals adjusted per carb count - add 2 units for every 50 carbs to be consumed   Yes [provider]  labetalol (NORMODYNE) 300 MG tablet Take 300 mg by mouth 2 (two) times daily.    Yes [provider]   magnesium oxide (MAG-OX) 400 MG tablet Take 400 mg by mouth daily.   Yes [provider]  Multiple Vitamin (MULTIVITAMIN WITH MINERALS) TABS tablet Take 1 tablet by mouth daily.   Yes [provider]  mycophenolate (MYFORTIC) 360 MG TBEC EC tablet Take 360 mg by mouth 2 (two) times daily.   Yes [provider]  omeprazole (PRILOSEC) 20 MG capsule Take 20 mg by mouth daily.   Yes [provider]  Potassium Citrate 15 MEQ (1620 MG) TBCR Take 1 tablet by mouth 3 (three) times daily.  09/21/16  Yes [provider]  predniSONE (DELTASONE) 5 MG tablet Take 5 mg by mouth daily with breakfast.   Yes [provider]  sulfamethoxazole-trimethoprim (BACTRIM,SEPTRA) 400-80 MG tablet Take 1 tablet by mouth every Monday, Wednesday, and Friday.    Yes [provider]  tacrolimus (PROGRAF) 0.5 MG capsule Take 2 mg by mouth 2 (two) times daily.    Yes [provider]  tamsulosin (FLOMAX) 0.4 MG CAPS capsule Take 0.4 mg by mouth daily after breakfast.   Yes [provider]  vitamin B-12 (CYANOCOBALAMIN) 1000 MCG tablet Take 1,000 mcg by mouth 2 (two) times daily.    Yes [provider]  Blood Glucose Monitoring Suppl (ONE TOUCH ULTRA 2) w/Device KIT  09/15/16   [provider]  doxycycline (VIBRAMYCIN) 100 MG capsule Take 1 capsule (100 mg total) by mouth 2 (two) times daily for 7 days. 06/18/17 06/25/17  Lorin Glass, PA-C    Family History Family History  Problem Relation Age of Onset  . Hypertension Mother   . Hyperlipidemia Father   . Hypertension Father   . Heart disease Father        before age 51  . Renal Disease Father   . Diabetes Brother   . Hyperlipidemia Son     Social History Social History   Tobacco Use  . Smoking status: Former Smoker    Packs/day: 0.50    Years: 40.00    Pack years: 20.00    Types: Cigarettes    Last attempt to quit: 12/27/2012    Years since quitting: 4.4  .  Smokeless tobacco: Never Used  Substance Use Topics  . Alcohol use: No    Alcohol/week: 0.0 oz  . Drug use: No     Allergies   Lisinopril; Meloxicam; and Victoza [liraglutide]   Review of Systems Review of Systems  Constitutional: Positive for activity change, appetite change and fatigue. Negative for chills and fever.  HENT: Positive for sore throat. Negative for ear pain.   Eyes: Negative for pain and visual disturbance.  Respiratory: Positive for cough and shortness of breath.   Cardiovascular: Negative for chest pain and palpitations.  Gastrointestinal: Negative for abdominal pain, diarrhea, nausea and vomiting.  Genitourinary: Positive for decreased urine volume and difficulty urinating. Negative for discharge, dysuria, frequency, hematuria and urgency.  Musculoskeletal: Negative for arthralgias and back pain.  Skin: Negative for color change and rash.  Neurological: Negative for seizures and syncope.  All other systems reviewed and are negative.    Physical Exam Updated Vital Signs BP (!) 156/86   Pulse 75   Temp 98 F (36.7 C) (Oral)   Resp 18   Ht 5' 11" (1.803 m)   Wt 101.6 kg (224 lb)   SpO2 98%   BMI 31.24 kg/m   Physical Exam  Constitutional: He is oriented to person, place, and time. He appears well-developed and well-nourished.  HENT:  Head: Normocephalic and atraumatic.  Mouth/Throat: Oropharynx is clear and moist.  Eyes: Conjunctivae are normal.  Neck: Normal range of motion. Neck supple.  Cardiovascular: Normal rate, regular rhythm, normal heart sounds and intact distal pulses.  No murmur heard. Pulmonary/Chest: Effort normal and breath sounds normal. No stridor. No respiratory distress. He has no wheezes. He has no rales.  Abdominal: Soft. Bowel sounds are normal. He exhibits no distension. There is no tenderness. There is no guarding.  Exam limited by body habitus.  Musculoskeletal: He exhibits no edema, tenderness or deformity.  5/5 strength  in bilateral upper and lower extremities.  Neurological: He is alert and oriented to person, place, and time.  Skin: Skin is warm and dry.  He is not diaphoretic.  Psychiatric: He has a normal mood and affect.  Nursing note and vitals reviewed.    ED Treatments / Results  Labs (all labs ordered are listed, but only abnormal results are displayed) Labs Reviewed  BASIC METABOLIC PANEL - Abnormal; Notable for the following components:      Result Value   BUN 38 (*)    Creatinine, Ser 2.57 (*)    GFR calc non Af Amer 24 (*)    GFR calc Af Amer 28 (*)    All other components within normal limits  URINALYSIS, ROUTINE W REFLEX MICROSCOPIC - Abnormal; Notable for the following components:   APPearance HAZY (*)    Hgb urine dipstick SMALL (*)    Protein, ur 30 (*)    Bacteria, UA RARE (*)    All other components within normal limits  BRAIN NATRIURETIC PEPTIDE - Abnormal; Notable for the following components:   B Natriuretic Peptide 131.8 (*)    All other components within normal limits  CBG MONITORING, ED - Abnormal; Notable for the following components:   Glucose-Capillary 102 (*)    All other components within normal limits  URINE CULTURE  CBC  HEPATIC FUNCTION PANEL  LIPASE, BLOOD  I-STAT TROPONIN, ED    EKG EKG Interpretation  Date/Time:  Sunday June 18 2017 12:19:27 EDT Ventricular Rate:  70 PR Interval:  166 QRS Duration: 92 QT Interval:  410 QTC Calculation: 442 R Axis:   -15 Text Interpretation:  Normal sinus rhythm Normal ECG No significant change since last tracing Confirmed by Wandra Arthurs [phone]) on 06/18/2017 1:47:55 PM   Radiology Ct Abdomen Pelvis Wo Contrast  Result Date: 06/18/2017 CLINICAL DATA:  Decreased appetite and oral intake. Sore throat, cough and renal insufficiency. History of kidney transplant. EXAM: CT ABDOMEN AND PELVIS WITHOUT CONTRAST TECHNIQUE: Multidetector CT imaging of the abdomen and pelvis was performed following the standard protocol  without IV contrast. COMPARISON:  Abdominopelvic CT 10/01/2015. FINDINGS: Lower chest: Clear lung bases. No significant pleural or pericardial effusion. Atherosclerosis of the aorta and coronary arteries. Hepatobiliary: The liver demonstrates diffusely decreased density consistent with steatosis. No focal abnormalities are seen on noncontrast imaging. Possible dependent non calcified gallstones versus sludge. No gallbladder wall thickening, surrounding inflammation or biliary dilatation. Pancreas: Unremarkable. No pancreatic ductal dilatation or surrounding inflammatory changes. Spleen: Normal in size without focal abnormality. Adrenals/Urinary Tract: Both adrenal glands appear normal. There is marked atrophy and cortical thinning of both native kidneys. There is a small cyst in the lower pole of the left kidney. No residual urinary tract calculus or hydronephrosis. Transplant kidney in the right iliac fossa appears stable, measuring 9.6 cm in length. There is no hydronephrosis or perinephric fluid collection. Urine in the bladder demonstrates mildly increased density. Stomach/Bowel: No evidence of bowel wall thickening, distention or surrounding inflammatory change. The appendix appears normal. Mild sigmoid diverticulosis. Vascular/Lymphatic: There are no enlarged abdominal or pelvic lymph nodes. Diffuse aortic and branch vessel atherosclerosis. Reproductive: The prostate gland and seminal vesicles appear stable. Other: There is an enlarging lenticular shaped fluid collection anterolaterally in the right abdominal wall, measuring 11.2 x 3.3 cm on image 61/3. This extends 9.3 cm on coronal image 32/6. There was a much smaller fluid collection in this area previously. No abdominal wall hernia or ascites. Musculoskeletal: No acute or significant osseous findings. Previous multilevel lumbar fusion and right femoral ORIF. IMPRESSION: 1. No acute findings or explanation for the patient's symptoms. The transplanted kidney  in  the right iliac fossa has a stable appearance. 2. The native kidneys are atrophied with cortical thinning. No residual urinary tract calculus or hydronephrosis. The urine in the bladder demonstrates mildly increased density, nonspecific. 3. Enlarging fluid collection in the right anterolateral abdominal wall, likely a postsurgical fluid collection. 4. Hepatic steatosis and Aortic Atherosclerosis (ICD10-I70.0). Electronically Signed   By: Richardean Sale M.D.   On: 06/18/2017 18:00   Dg Chest 2 View  Result Date: 06/18/2017 CLINICAL DATA:  Cough EXAM: CHEST - 2 VIEW COMPARISON:  06/04/2017 chest radiograph. FINDINGS: Loop recorder overlies the left heart. Stable cardiomediastinal silhouette with top-normal heart size. No pneumothorax. No pleural effusion. Mildly hyperinflated lungs. No pulmonary edema. No acute consolidative airspace disease. IMPRESSION: Mildly hyperinflated lungs, cannot exclude obstructive lung disease. Otherwise no active cardiopulmonary disease. Electronically Signed   By: Ilona Sorrel M.D.   On: 06/18/2017 13:48    Procedures Procedures (including critical care time)  Medications Ordered in ED Medications  sodium chloride 0.9 % bolus 500 mL (0 mLs Intravenous Stopped 06/18/17 1644)  sodium chloride 0.9 % bolus 500 mL (0 mLs Intravenous Stopped 06/18/17 1925)     Initial Impression / Assessment and Plan / ED Course  I have reviewed the triage vital signs and the nursing notes.  Pertinent labs & imaging results that were available during my care of the patient were reviewed by me and considered in my medical decision making (see chart for details).  Clinical Course as of Jun 19 1117  Sun Jun 18, 2017  1304 Cough since 5/15.  Decreased PO intake.  Feels like need to pee but can't/  Was on augmentin.  No pain    [EH]  1307 Dr. Clover Mealy- nephrologist   [EH]  [phone] Patient re-evaluated, informed of all results.  Reports has had the right sided abdominal swelling since  transplant.  Slowly increasing in size but no rapid changes.     [EH]    Clinical Course User Index [EH] Lorin Glass, PA-C   Patient presents today for evaluation of generalized feeling poorly with decreased appetite.  Patient was recently admitted for same and diagnosed with AKI.  Labs were obtained and reviewed, creatinine/BUN/GFR, consistent with patient's normal baseline, no evidence of AKI today.  BNP 131.8.  Hepatic function panel normal.  Lipase and troponin are not elevated.  Patient is not anemic with a normal white count.  Chest x-ray was obtained based on continued cough which did not show any cause for patient's cough.  Based on generalized decreased appetite that has failed to improve CT abdomen pelvis was obtained without obstructions, abnormal masses.  CT does show a concern for a postoperative seroma in the right sided abdominal wall, this area is not tender to palpation, red, indurated, suspect chronic process unrelated to today's visit.  CT scan did show questionable bladder content abnormality.  Patient does still make urine.  Patient was gently hydrated both IV and orally, after which she was able to urinate.  Urine slightly concerning for infection, based on patient's symptoms and failure to improve, after discussions with Dr. Darl Householder will treat with doxycycline.  Urine culture sent.  PCP follow-up.  Return precautions were discussed, patient and family state understanding.  Patient discharged home.  Final Clinical Impressions(s) / ED Diagnoses   Final diagnoses:  Acute cystitis with hematuria  Generalized weakness    ED Discharge Orders        Ordered    doxycycline (VIBRAMYCIN) 100 MG capsule  2  times daily     06/18/17 2126       Lorin Glass, PA-C 06/19/17 1123    Drenda Freeze, MD 06/20/17 [phone]

## 2017-06-18 NOTE — Discharge Instructions (Signed)
Please make your transplant surgeon aware that you have a fluid collection in your right sided abdominal wall.    Please follow up with your doctor in the next 1-2 days or back in the ED sooner if worsening symptoms.

## 2017-06-19 DIAGNOSIS — E118 Type 2 diabetes mellitus with unspecified complications: Secondary | ICD-10-CM | POA: Diagnosis not present

## 2017-06-19 DIAGNOSIS — Z7982 Long term (current) use of aspirin: Secondary | ICD-10-CM | POA: Diagnosis not present

## 2017-06-19 DIAGNOSIS — R6881 Early satiety: Secondary | ICD-10-CM | POA: Diagnosis not present

## 2017-06-19 DIAGNOSIS — I5032 Chronic diastolic (congestive) heart failure: Secondary | ICD-10-CM | POA: Diagnosis not present

## 2017-06-19 DIAGNOSIS — Z8673 Personal history of transient ischemic attack (TIA), and cerebral infarction without residual deficits: Secondary | ICD-10-CM | POA: Diagnosis not present

## 2017-06-19 DIAGNOSIS — R339 Retention of urine, unspecified: Secondary | ICD-10-CM | POA: Diagnosis not present

## 2017-06-19 DIAGNOSIS — E041 Nontoxic single thyroid nodule: Secondary | ICD-10-CM | POA: Diagnosis not present

## 2017-06-19 DIAGNOSIS — E43 Unspecified severe protein-calorie malnutrition: Secondary | ICD-10-CM | POA: Diagnosis not present

## 2017-06-19 DIAGNOSIS — I951 Orthostatic hypotension: Secondary | ICD-10-CM | POA: Diagnosis not present

## 2017-06-19 DIAGNOSIS — E1122 Type 2 diabetes mellitus with diabetic chronic kidney disease: Secondary | ICD-10-CM | POA: Diagnosis not present

## 2017-06-19 DIAGNOSIS — E162 Hypoglycemia, unspecified: Secondary | ICD-10-CM | POA: Diagnosis not present

## 2017-06-19 DIAGNOSIS — I110 Hypertensive heart disease with heart failure: Secondary | ICD-10-CM | POA: Diagnosis not present

## 2017-06-19 DIAGNOSIS — E860 Dehydration: Secondary | ICD-10-CM | POA: Diagnosis not present

## 2017-06-19 DIAGNOSIS — G4733 Obstructive sleep apnea (adult) (pediatric): Secondary | ICD-10-CM | POA: Diagnosis not present

## 2017-06-19 DIAGNOSIS — Z09 Encounter for follow-up examination after completed treatment for conditions other than malignant neoplasm: Secondary | ICD-10-CM | POA: Diagnosis not present

## 2017-06-19 DIAGNOSIS — R946 Abnormal results of thyroid function studies: Secondary | ICD-10-CM | POA: Diagnosis not present

## 2017-06-19 DIAGNOSIS — Z79899 Other long term (current) drug therapy: Secondary | ICD-10-CM | POA: Diagnosis not present

## 2017-06-19 DIAGNOSIS — E161 Other hypoglycemia: Secondary | ICD-10-CM | POA: Diagnosis not present

## 2017-06-19 DIAGNOSIS — I129 Hypertensive chronic kidney disease with stage 1 through stage 4 chronic kidney disease, or unspecified chronic kidney disease: Secondary | ICD-10-CM | POA: Diagnosis not present

## 2017-06-19 DIAGNOSIS — R0602 Shortness of breath: Secondary | ICD-10-CM | POA: Diagnosis not present

## 2017-06-19 DIAGNOSIS — R61 Generalized hyperhidrosis: Secondary | ICD-10-CM | POA: Diagnosis not present

## 2017-06-19 DIAGNOSIS — H9201 Otalgia, right ear: Secondary | ICD-10-CM | POA: Diagnosis not present

## 2017-06-19 DIAGNOSIS — R000 Tachycardia, unspecified: Secondary | ICD-10-CM | POA: Diagnosis not present

## 2017-06-19 DIAGNOSIS — Z4822 Encounter for aftercare following kidney transplant: Secondary | ICD-10-CM | POA: Diagnosis not present

## 2017-06-19 DIAGNOSIS — N185 Chronic kidney disease, stage 5: Secondary | ICD-10-CM | POA: Diagnosis not present

## 2017-06-19 DIAGNOSIS — R079 Chest pain, unspecified: Secondary | ICD-10-CM | POA: Diagnosis not present

## 2017-06-19 DIAGNOSIS — Z87891 Personal history of nicotine dependence: Secondary | ICD-10-CM | POA: Diagnosis not present

## 2017-06-19 DIAGNOSIS — N179 Acute kidney failure, unspecified: Secondary | ICD-10-CM | POA: Diagnosis not present

## 2017-06-19 DIAGNOSIS — R05 Cough: Secondary | ICD-10-CM | POA: Diagnosis not present

## 2017-06-19 DIAGNOSIS — R55 Syncope and collapse: Secondary | ICD-10-CM | POA: Diagnosis not present

## 2017-06-19 DIAGNOSIS — Z940 Kidney transplant status: Secondary | ICD-10-CM | POA: Diagnosis not present

## 2017-06-19 DIAGNOSIS — E861 Hypovolemia: Secondary | ICD-10-CM | POA: Diagnosis not present

## 2017-06-19 DIAGNOSIS — R0682 Tachypnea, not elsewhere classified: Secondary | ICD-10-CM | POA: Diagnosis not present

## 2017-06-19 DIAGNOSIS — R531 Weakness: Secondary | ICD-10-CM | POA: Diagnosis not present

## 2017-06-19 DIAGNOSIS — R197 Diarrhea, unspecified: Secondary | ICD-10-CM | POA: Diagnosis not present

## 2017-06-19 DIAGNOSIS — Z9989 Dependence on other enabling machines and devices: Secondary | ICD-10-CM | POA: Diagnosis not present

## 2017-06-20 DIAGNOSIS — R61 Generalized hyperhidrosis: Secondary | ICD-10-CM | POA: Diagnosis not present

## 2017-06-20 DIAGNOSIS — R339 Retention of urine, unspecified: Secondary | ICD-10-CM | POA: Diagnosis not present

## 2017-06-20 DIAGNOSIS — N179 Acute kidney failure, unspecified: Secondary | ICD-10-CM | POA: Diagnosis not present

## 2017-06-20 DIAGNOSIS — R000 Tachycardia, unspecified: Secondary | ICD-10-CM | POA: Diagnosis not present

## 2017-06-20 DIAGNOSIS — I130 Hypertensive heart and chronic kidney disease with heart failure and stage 1 through stage 4 chronic kidney disease, or unspecified chronic kidney disease: Secondary | ICD-10-CM | POA: Diagnosis not present

## 2017-06-20 DIAGNOSIS — E118 Type 2 diabetes mellitus with unspecified complications: Secondary | ICD-10-CM | POA: Diagnosis not present

## 2017-06-20 DIAGNOSIS — I951 Orthostatic hypotension: Secondary | ICD-10-CM | POA: Diagnosis not present

## 2017-06-20 DIAGNOSIS — R197 Diarrhea, unspecified: Secondary | ICD-10-CM | POA: Diagnosis not present

## 2017-06-20 DIAGNOSIS — Z7982 Long term (current) use of aspirin: Secondary | ICD-10-CM | POA: Diagnosis not present

## 2017-06-20 DIAGNOSIS — H9201 Otalgia, right ear: Secondary | ICD-10-CM | POA: Diagnosis not present

## 2017-06-20 DIAGNOSIS — I5032 Chronic diastolic (congestive) heart failure: Secondary | ICD-10-CM | POA: Diagnosis not present

## 2017-06-20 DIAGNOSIS — I110 Hypertensive heart disease with heart failure: Secondary | ICD-10-CM | POA: Diagnosis not present

## 2017-06-20 DIAGNOSIS — Z9989 Dependence on other enabling machines and devices: Secondary | ICD-10-CM | POA: Diagnosis not present

## 2017-06-20 DIAGNOSIS — E861 Hypovolemia: Secondary | ICD-10-CM | POA: Diagnosis not present

## 2017-06-20 DIAGNOSIS — R05 Cough: Secondary | ICD-10-CM | POA: Diagnosis not present

## 2017-06-20 DIAGNOSIS — Z09 Encounter for follow-up examination after completed treatment for conditions other than malignant neoplasm: Secondary | ICD-10-CM | POA: Diagnosis not present

## 2017-06-20 DIAGNOSIS — Z8673 Personal history of transient ischemic attack (TIA), and cerebral infarction without residual deficits: Secondary | ICD-10-CM | POA: Diagnosis not present

## 2017-06-20 DIAGNOSIS — E860 Dehydration: Secondary | ICD-10-CM | POA: Diagnosis not present

## 2017-06-20 DIAGNOSIS — Z79899 Other long term (current) drug therapy: Secondary | ICD-10-CM | POA: Diagnosis not present

## 2017-06-20 DIAGNOSIS — Z87891 Personal history of nicotine dependence: Secondary | ICD-10-CM | POA: Diagnosis not present

## 2017-06-20 DIAGNOSIS — R55 Syncope and collapse: Secondary | ICD-10-CM | POA: Diagnosis not present

## 2017-06-20 DIAGNOSIS — R6881 Early satiety: Secondary | ICD-10-CM | POA: Diagnosis not present

## 2017-06-20 DIAGNOSIS — E041 Nontoxic single thyroid nodule: Secondary | ICD-10-CM | POA: Diagnosis not present

## 2017-06-20 DIAGNOSIS — R946 Abnormal results of thyroid function studies: Secondary | ICD-10-CM | POA: Diagnosis not present

## 2017-06-20 DIAGNOSIS — G4733 Obstructive sleep apnea (adult) (pediatric): Secondary | ICD-10-CM | POA: Diagnosis not present

## 2017-06-20 DIAGNOSIS — Z940 Kidney transplant status: Secondary | ICD-10-CM | POA: Diagnosis not present

## 2017-06-20 DIAGNOSIS — E43 Unspecified severe protein-calorie malnutrition: Secondary | ICD-10-CM | POA: Diagnosis not present

## 2017-06-20 LAB — URINE CULTURE: Culture: <10000 — AB

## 2017-06-21 DIAGNOSIS — R339 Retention of urine, unspecified: Secondary | ICD-10-CM | POA: Diagnosis not present

## 2017-06-21 DIAGNOSIS — Z79899 Other long term (current) drug therapy: Secondary | ICD-10-CM | POA: Diagnosis not present

## 2017-06-21 DIAGNOSIS — R197 Diarrhea, unspecified: Secondary | ICD-10-CM | POA: Diagnosis not present

## 2017-06-21 DIAGNOSIS — Z87891 Personal history of nicotine dependence: Secondary | ICD-10-CM | POA: Diagnosis not present

## 2017-06-21 DIAGNOSIS — R55 Syncope and collapse: Secondary | ICD-10-CM | POA: Diagnosis not present

## 2017-06-21 DIAGNOSIS — Z8673 Personal history of transient ischemic attack (TIA), and cerebral infarction without residual deficits: Secondary | ICD-10-CM | POA: Diagnosis not present

## 2017-06-21 DIAGNOSIS — I951 Orthostatic hypotension: Secondary | ICD-10-CM | POA: Diagnosis not present

## 2017-06-21 DIAGNOSIS — R946 Abnormal results of thyroid function studies: Secondary | ICD-10-CM | POA: Diagnosis not present

## 2017-06-21 DIAGNOSIS — G4733 Obstructive sleep apnea (adult) (pediatric): Secondary | ICD-10-CM | POA: Diagnosis not present

## 2017-06-21 DIAGNOSIS — E43 Unspecified severe protein-calorie malnutrition: Secondary | ICD-10-CM | POA: Diagnosis not present

## 2017-06-21 DIAGNOSIS — E119 Type 2 diabetes mellitus without complications: Secondary | ICD-10-CM | POA: Diagnosis not present

## 2017-06-21 DIAGNOSIS — N179 Acute kidney failure, unspecified: Secondary | ICD-10-CM | POA: Diagnosis not present

## 2017-06-21 DIAGNOSIS — R6881 Early satiety: Secondary | ICD-10-CM | POA: Diagnosis not present

## 2017-06-21 DIAGNOSIS — I110 Hypertensive heart disease with heart failure: Secondary | ICD-10-CM | POA: Diagnosis not present

## 2017-06-21 DIAGNOSIS — Z7982 Long term (current) use of aspirin: Secondary | ICD-10-CM | POA: Diagnosis not present

## 2017-06-21 DIAGNOSIS — E118 Type 2 diabetes mellitus with unspecified complications: Secondary | ICD-10-CM | POA: Diagnosis not present

## 2017-06-21 DIAGNOSIS — Z09 Encounter for follow-up examination after completed treatment for conditions other than malignant neoplasm: Secondary | ICD-10-CM | POA: Diagnosis not present

## 2017-06-21 DIAGNOSIS — H9201 Otalgia, right ear: Secondary | ICD-10-CM | POA: Diagnosis not present

## 2017-06-21 DIAGNOSIS — R000 Tachycardia, unspecified: Secondary | ICD-10-CM | POA: Diagnosis not present

## 2017-06-21 DIAGNOSIS — I5032 Chronic diastolic (congestive) heart failure: Secondary | ICD-10-CM | POA: Diagnosis not present

## 2017-06-21 DIAGNOSIS — I517 Cardiomegaly: Secondary | ICD-10-CM | POA: Diagnosis not present

## 2017-06-21 DIAGNOSIS — Z940 Kidney transplant status: Secondary | ICD-10-CM | POA: Diagnosis not present

## 2017-06-21 DIAGNOSIS — E041 Nontoxic single thyroid nodule: Secondary | ICD-10-CM | POA: Diagnosis not present

## 2017-06-21 DIAGNOSIS — E860 Dehydration: Secondary | ICD-10-CM | POA: Diagnosis not present

## 2017-06-21 DIAGNOSIS — I5020 Unspecified systolic (congestive) heart failure: Secondary | ICD-10-CM | POA: Diagnosis not present

## 2017-06-21 DIAGNOSIS — R05 Cough: Secondary | ICD-10-CM | POA: Diagnosis not present

## 2017-06-21 DIAGNOSIS — Z9989 Dependence on other enabling machines and devices: Secondary | ICD-10-CM | POA: Diagnosis not present

## 2017-06-21 DIAGNOSIS — E861 Hypovolemia: Secondary | ICD-10-CM | POA: Diagnosis not present

## 2017-06-21 DIAGNOSIS — R61 Generalized hyperhidrosis: Secondary | ICD-10-CM | POA: Diagnosis not present

## 2017-06-22 DIAGNOSIS — Z87891 Personal history of nicotine dependence: Secondary | ICD-10-CM | POA: Diagnosis not present

## 2017-06-22 DIAGNOSIS — Z09 Encounter for follow-up examination after completed treatment for conditions other than malignant neoplasm: Secondary | ICD-10-CM | POA: Diagnosis not present

## 2017-06-22 DIAGNOSIS — R05 Cough: Secondary | ICD-10-CM | POA: Diagnosis not present

## 2017-06-22 DIAGNOSIS — E43 Unspecified severe protein-calorie malnutrition: Secondary | ICD-10-CM | POA: Diagnosis not present

## 2017-06-22 DIAGNOSIS — R9431 Abnormal electrocardiogram [ECG] [EKG]: Secondary | ICD-10-CM | POA: Diagnosis not present

## 2017-06-22 DIAGNOSIS — R61 Generalized hyperhidrosis: Secondary | ICD-10-CM | POA: Diagnosis not present

## 2017-06-22 DIAGNOSIS — E861 Hypovolemia: Secondary | ICD-10-CM | POA: Diagnosis not present

## 2017-06-22 DIAGNOSIS — I951 Orthostatic hypotension: Secondary | ICD-10-CM | POA: Diagnosis not present

## 2017-06-22 DIAGNOSIS — I130 Hypertensive heart and chronic kidney disease with heart failure and stage 1 through stage 4 chronic kidney disease, or unspecified chronic kidney disease: Secondary | ICD-10-CM | POA: Diagnosis not present

## 2017-06-22 DIAGNOSIS — Z8673 Personal history of transient ischemic attack (TIA), and cerebral infarction without residual deficits: Secondary | ICD-10-CM | POA: Diagnosis not present

## 2017-06-22 DIAGNOSIS — Z7982 Long term (current) use of aspirin: Secondary | ICD-10-CM | POA: Diagnosis not present

## 2017-06-22 DIAGNOSIS — I5032 Chronic diastolic (congestive) heart failure: Secondary | ICD-10-CM | POA: Diagnosis not present

## 2017-06-22 DIAGNOSIS — G4733 Obstructive sleep apnea (adult) (pediatric): Secondary | ICD-10-CM | POA: Diagnosis not present

## 2017-06-22 DIAGNOSIS — R339 Retention of urine, unspecified: Secondary | ICD-10-CM | POA: Diagnosis not present

## 2017-06-22 DIAGNOSIS — R197 Diarrhea, unspecified: Secondary | ICD-10-CM | POA: Diagnosis not present

## 2017-06-22 DIAGNOSIS — E041 Nontoxic single thyroid nodule: Secondary | ICD-10-CM | POA: Diagnosis not present

## 2017-06-22 DIAGNOSIS — E860 Dehydration: Secondary | ICD-10-CM | POA: Diagnosis not present

## 2017-06-22 DIAGNOSIS — N179 Acute kidney failure, unspecified: Secondary | ICD-10-CM | POA: Diagnosis not present

## 2017-06-22 DIAGNOSIS — Z940 Kidney transplant status: Secondary | ICD-10-CM | POA: Diagnosis not present

## 2017-06-22 DIAGNOSIS — E118 Type 2 diabetes mellitus with unspecified complications: Secondary | ICD-10-CM | POA: Diagnosis not present

## 2017-06-22 DIAGNOSIS — R6881 Early satiety: Secondary | ICD-10-CM | POA: Diagnosis not present

## 2017-06-22 DIAGNOSIS — R55 Syncope and collapse: Secondary | ICD-10-CM | POA: Diagnosis not present

## 2017-06-22 DIAGNOSIS — I110 Hypertensive heart disease with heart failure: Secondary | ICD-10-CM | POA: Diagnosis not present

## 2017-06-22 DIAGNOSIS — Z9989 Dependence on other enabling machines and devices: Secondary | ICD-10-CM | POA: Diagnosis not present

## 2017-06-22 DIAGNOSIS — H9201 Otalgia, right ear: Secondary | ICD-10-CM | POA: Diagnosis not present

## 2017-06-22 DIAGNOSIS — Z79899 Other long term (current) drug therapy: Secondary | ICD-10-CM | POA: Diagnosis not present

## 2017-06-22 DIAGNOSIS — R000 Tachycardia, unspecified: Secondary | ICD-10-CM | POA: Diagnosis not present

## 2017-06-22 DIAGNOSIS — R946 Abnormal results of thyroid function studies: Secondary | ICD-10-CM | POA: Diagnosis not present

## 2017-06-23 DIAGNOSIS — R197 Diarrhea, unspecified: Secondary | ICD-10-CM | POA: Diagnosis not present

## 2017-06-23 DIAGNOSIS — R946 Abnormal results of thyroid function studies: Secondary | ICD-10-CM | POA: Diagnosis not present

## 2017-06-23 DIAGNOSIS — Z7982 Long term (current) use of aspirin: Secondary | ICD-10-CM | POA: Diagnosis not present

## 2017-06-23 DIAGNOSIS — I110 Hypertensive heart disease with heart failure: Secondary | ICD-10-CM | POA: Diagnosis not present

## 2017-06-23 DIAGNOSIS — E1122 Type 2 diabetes mellitus with diabetic chronic kidney disease: Secondary | ICD-10-CM | POA: Diagnosis not present

## 2017-06-23 DIAGNOSIS — E861 Hypovolemia: Secondary | ICD-10-CM | POA: Diagnosis not present

## 2017-06-23 DIAGNOSIS — I517 Cardiomegaly: Secondary | ICD-10-CM | POA: Diagnosis not present

## 2017-06-23 DIAGNOSIS — R6881 Early satiety: Secondary | ICD-10-CM | POA: Diagnosis not present

## 2017-06-23 DIAGNOSIS — E43 Unspecified severe protein-calorie malnutrition: Secondary | ICD-10-CM | POA: Diagnosis not present

## 2017-06-23 DIAGNOSIS — I951 Orthostatic hypotension: Secondary | ICD-10-CM | POA: Diagnosis not present

## 2017-06-23 DIAGNOSIS — Z79899 Other long term (current) drug therapy: Secondary | ICD-10-CM | POA: Diagnosis not present

## 2017-06-23 DIAGNOSIS — Z9989 Dependence on other enabling machines and devices: Secondary | ICD-10-CM | POA: Diagnosis not present

## 2017-06-23 DIAGNOSIS — E041 Nontoxic single thyroid nodule: Secondary | ICD-10-CM | POA: Diagnosis not present

## 2017-06-23 DIAGNOSIS — E860 Dehydration: Secondary | ICD-10-CM | POA: Diagnosis not present

## 2017-06-23 DIAGNOSIS — Z940 Kidney transplant status: Secondary | ICD-10-CM | POA: Diagnosis not present

## 2017-06-23 DIAGNOSIS — I5032 Chronic diastolic (congestive) heart failure: Secondary | ICD-10-CM | POA: Diagnosis not present

## 2017-06-23 DIAGNOSIS — Z8673 Personal history of transient ischemic attack (TIA), and cerebral infarction without residual deficits: Secondary | ICD-10-CM | POA: Diagnosis not present

## 2017-06-23 DIAGNOSIS — G4733 Obstructive sleep apnea (adult) (pediatric): Secondary | ICD-10-CM | POA: Diagnosis not present

## 2017-06-23 DIAGNOSIS — E118 Type 2 diabetes mellitus with unspecified complications: Secondary | ICD-10-CM | POA: Diagnosis not present

## 2017-06-23 DIAGNOSIS — Z87891 Personal history of nicotine dependence: Secondary | ICD-10-CM | POA: Diagnosis not present

## 2017-06-23 DIAGNOSIS — H9201 Otalgia, right ear: Secondary | ICD-10-CM | POA: Diagnosis not present

## 2017-06-23 DIAGNOSIS — R61 Generalized hyperhidrosis: Secondary | ICD-10-CM | POA: Diagnosis not present

## 2017-06-23 DIAGNOSIS — Z09 Encounter for follow-up examination after completed treatment for conditions other than malignant neoplasm: Secondary | ICD-10-CM | POA: Diagnosis not present

## 2017-06-23 DIAGNOSIS — R05 Cough: Secondary | ICD-10-CM | POA: Diagnosis not present

## 2017-06-23 DIAGNOSIS — I130 Hypertensive heart and chronic kidney disease with heart failure and stage 1 through stage 4 chronic kidney disease, or unspecified chronic kidney disease: Secondary | ICD-10-CM | POA: Diagnosis not present

## 2017-06-23 DIAGNOSIS — R339 Retention of urine, unspecified: Secondary | ICD-10-CM | POA: Diagnosis not present

## 2017-06-23 DIAGNOSIS — R000 Tachycardia, unspecified: Secondary | ICD-10-CM | POA: Diagnosis not present

## 2017-06-23 DIAGNOSIS — N179 Acute kidney failure, unspecified: Secondary | ICD-10-CM | POA: Diagnosis not present

## 2017-06-26 DIAGNOSIS — I10 Essential (primary) hypertension: Secondary | ICD-10-CM | POA: Diagnosis not present

## 2017-06-26 DIAGNOSIS — Z1339 Encounter for screening examination for other mental health and behavioral disorders: Secondary | ICD-10-CM | POA: Diagnosis not present

## 2017-06-26 DIAGNOSIS — R05 Cough: Secondary | ICD-10-CM | POA: Diagnosis not present

## 2017-06-28 DIAGNOSIS — E1165 Type 2 diabetes mellitus with hyperglycemia: Secondary | ICD-10-CM | POA: Diagnosis not present

## 2017-07-07 DIAGNOSIS — M545 Low back pain: Secondary | ICD-10-CM | POA: Diagnosis not present

## 2017-07-07 DIAGNOSIS — M961 Postlaminectomy syndrome, not elsewhere classified: Secondary | ICD-10-CM | POA: Diagnosis not present

## 2017-07-10 DIAGNOSIS — Z940 Kidney transplant status: Secondary | ICD-10-CM | POA: Diagnosis not present

## 2017-07-17 DIAGNOSIS — Z1339 Encounter for screening examination for other mental health and behavioral disorders: Secondary | ICD-10-CM | POA: Diagnosis not present

## 2017-07-17 DIAGNOSIS — E1165 Type 2 diabetes mellitus with hyperglycemia: Secondary | ICD-10-CM | POA: Diagnosis not present

## 2017-07-17 DIAGNOSIS — R5381 Other malaise: Secondary | ICD-10-CM | POA: Diagnosis not present

## 2017-07-17 DIAGNOSIS — J449 Chronic obstructive pulmonary disease, unspecified: Secondary | ICD-10-CM | POA: Diagnosis not present

## 2017-07-17 DIAGNOSIS — R5383 Other fatigue: Secondary | ICD-10-CM | POA: Diagnosis not present

## 2017-07-17 DIAGNOSIS — Z0000 Encounter for general adult medical examination without abnormal findings: Secondary | ICD-10-CM | POA: Diagnosis not present

## 2017-07-18 DIAGNOSIS — I129 Hypertensive chronic kidney disease with stage 1 through stage 4 chronic kidney disease, or unspecified chronic kidney disease: Secondary | ICD-10-CM | POA: Diagnosis not present

## 2017-07-18 DIAGNOSIS — R110 Nausea: Secondary | ICD-10-CM | POA: Diagnosis not present

## 2017-07-18 DIAGNOSIS — Z940 Kidney transplant status: Secondary | ICD-10-CM | POA: Diagnosis not present

## 2017-07-26 ENCOUNTER — Encounter (HOSPITAL_COMMUNITY): Payer: Self-pay

## 2017-07-26 ENCOUNTER — Emergency Department (HOSPITAL_COMMUNITY)
Admission: EM | Admit: 2017-07-26 | Discharge: 2017-07-26 | Disposition: A | Discharge status: Home / Self Care | Payer: Medicare Other | Attending: Emergency Medicine | Admitting: Emergency Medicine

## 2017-07-26 ENCOUNTER — Other Ambulatory Visit: Payer: Self-pay

## 2017-07-26 ENCOUNTER — Emergency Department (HOSPITAL_COMMUNITY): Payer: Medicare Other

## 2017-07-26 DIAGNOSIS — M5010 Cervical disc disorder with radiculopathy, unspecified cervical region: Secondary | ICD-10-CM | POA: Diagnosis not present

## 2017-07-26 DIAGNOSIS — E119 Type 2 diabetes mellitus without complications: Secondary | ICD-10-CM | POA: Diagnosis not present

## 2017-07-26 DIAGNOSIS — Z79899 Other long term (current) drug therapy: Secondary | ICD-10-CM | POA: Diagnosis not present

## 2017-07-26 DIAGNOSIS — Z87891 Personal history of nicotine dependence: Secondary | ICD-10-CM | POA: Diagnosis not present

## 2017-07-26 DIAGNOSIS — E042 Nontoxic multinodular goiter: Secondary | ICD-10-CM | POA: Diagnosis not present

## 2017-07-26 DIAGNOSIS — N189 Chronic kidney disease, unspecified: Secondary | ICD-10-CM | POA: Insufficient documentation

## 2017-07-26 DIAGNOSIS — E860 Dehydration: Secondary | ICD-10-CM

## 2017-07-26 DIAGNOSIS — R51 Headache: Secondary | ICD-10-CM | POA: Diagnosis not present

## 2017-07-26 DIAGNOSIS — Z7982 Long term (current) use of aspirin: Secondary | ICD-10-CM | POA: Insufficient documentation

## 2017-07-26 DIAGNOSIS — R1310 Dysphagia, unspecified: Secondary | ICD-10-CM | POA: Insufficient documentation

## 2017-07-26 DIAGNOSIS — M2578 Osteophyte, vertebrae: Secondary | ICD-10-CM | POA: Diagnosis not present

## 2017-07-26 DIAGNOSIS — I129 Hypertensive chronic kidney disease with stage 1 through stage 4 chronic kidney disease, or unspecified chronic kidney disease: Secondary | ICD-10-CM | POA: Diagnosis not present

## 2017-07-26 DIAGNOSIS — R42 Dizziness and giddiness: Secondary | ICD-10-CM | POA: Diagnosis not present

## 2017-07-26 LAB — URINALYSIS, ROUTINE W REFLEX MICROSCOPIC
BACTERIA UA: NONE SEEN
Bilirubin Urine: NEGATIVE
GLUCOSE, UA: 50 mg/dL — AB
Hgb urine dipstick: NEGATIVE
Ketones, ur: NEGATIVE mg/dL
Leukocytes, UA: NEGATIVE
NITRITE: NEGATIVE
PROTEIN: 30 mg/dL — AB
Specific Gravity, Urine: 1.031 — ABNORMAL HIGH (ref 1.005–1.030)
pH: 5 (ref 5.0–8.0)

## 2017-07-26 LAB — BASIC METABOLIC PANEL
Anion gap: 8 (ref 5–15)
BUN: 26 mg/dL — AB (ref 8–23)
CALCIUM: 9.8 mg/dL (ref 8.9–10.3)
CO2: 23 mmol/L (ref 22–32)
Chloride: 110 mmol/L (ref 98–111)
Creatinine, Ser: 1.6 mg/dL — ABNORMAL HIGH (ref 0.61–1.24)
GFR calc Af Amer: 50 mL/min — ABNORMAL LOW (ref >60)
GFR, EST NON AFRICAN AMERICAN: 43 mL/min — AB (ref >60)
GLUCOSE: 169 mg/dL — AB (ref 70–99)
Potassium: 4.5 mmol/L (ref 3.5–5.1)
Sodium: 141 mmol/L (ref 135–145)

## 2017-07-26 LAB — CBC
HEMATOCRIT: 39.2 % (ref 39.0–52.0)
Hemoglobin: 12.7 g/dL — ABNORMAL LOW (ref 13.0–17.0)
MCH: 29.7 pg (ref 26.0–34.0)
MCHC: 32.4 g/dL (ref 30.0–36.0)
MCV: 91.6 fL (ref 78.0–100.0)
Platelets: 132 10*3/uL — ABNORMAL LOW (ref 150–400)
RBC: 4.28 MIL/uL (ref 4.22–5.81)
RDW: 12.8 % (ref 11.5–15.5)
WBC: 6.8 10*3/uL (ref 4.0–10.5)

## 2017-07-26 LAB — HEPATIC FUNCTION PANEL
ALBUMIN: 3.7 g/dL (ref 3.5–5.0)
ALK PHOS: 65 U/L (ref 38–126)
ALT: 39 U/L (ref 0–44)
AST: 24 U/L (ref 15–41)
BILIRUBIN TOTAL: 0.7 mg/dL (ref 0.3–1.2)
Bilirubin, Direct: 0.2 mg/dL (ref 0.0–0.2)
Indirect Bilirubin: 0.5 mg/dL (ref 0.3–0.9)
TOTAL PROTEIN: 6.5 g/dL (ref 6.5–8.1)

## 2017-07-26 LAB — T4, FREE: FREE T4: 1.44 ng/dL (ref 0.82–1.77)

## 2017-07-26 LAB — TSH: TSH: 0.013 u[IU]/mL — AB (ref 0.350–4.500)

## 2017-07-26 LAB — LIPASE, BLOOD: Lipase: 34 U/L (ref 11–51)

## 2017-07-26 LAB — MAGNESIUM: Magnesium: 1.9 mg/dL (ref 1.7–2.4)

## 2017-07-26 MED ORDER — FLUCONAZOLE 150 MG PO TABS: 150.0000 mg | ORAL_TABLET | Freq: Every day | ORAL | 0 refills | Status: AC

## 2017-07-26 MED ORDER — SODIUM CHLORIDE 0.9 % IV BOLUS
1000.0000 mL | Freq: Once | INTRAVENOUS | Status: AC
  Administered 2017-07-26: 1000 mL via INTRAVENOUS

## 2017-07-26 MED ORDER — CYCLOBENZAPRINE HCL 5 MG PO TABS: 5.0000 mg | ORAL_TABLET | Freq: Two times a day (BID) | ORAL | 0 refills | Status: AC | PRN

## 2017-07-26 MED ORDER — DIPHENHYDRAMINE HCL 50 MG/ML IJ SOLN
25.0000 mg | Freq: Once | INTRAMUSCULAR | Status: AC
  Administered 2017-07-26: 25 mg via INTRAVENOUS
  Filled 2017-07-26: qty 1

## 2017-07-26 MED ORDER — SUCRALFATE 1 G PO TABS: 1.0000 g | ORAL_TABLET | Freq: Three times a day (TID) | ORAL | 0 refills | Status: DC

## 2017-07-26 MED ORDER — GI COCKTAIL ~~LOC~~
30.0000 mL | Freq: Once | ORAL | Status: AC
  Administered 2017-07-26: 30 mL via ORAL
  Filled 2017-07-26: qty 30

## 2017-07-26 MED ORDER — METOCLOPRAMIDE HCL 5 MG/ML IJ SOLN
10.0000 mg | Freq: Once | INTRAMUSCULAR | Status: AC
  Administered 2017-07-26: 10 mg via INTRAVENOUS
  Filled 2017-07-26: qty 2

## 2017-07-26 NOTE — Discharge Instructions (Addendum)
As we discussed, your CT scan did show bone spurs in the cervical spine.  This could be contributing to your symptoms.  I discussed this with the on-call neurosurgeon.  He recommended calling Dr. Arnoldo Morale in the morning.  For now, you will likely also need an EGD to look at your esophagus.  I have prescribed you Carafate, which can help with some of your pain.  You can also try the Flexeril, which can cause muscle relaxation to help with spasms, if the Carafate does not help.  This can make you tired, so be careful taking it.

## 2017-07-26 NOTE — ED Provider Notes (Signed)
[facility] DEPARTMENT Provider Note   CSN: [id number] Arrival date & time: 07/26/17  1413     History   Chief Complaint Chief Complaint  Patient presents with  . Dizziness    HPI ERICK MURIN is a 67 y.o. male.  HPI 67 year old male with past medical history as below here with mild difficulty and pain swallowing.  The patient has an extensive past medical history as below, including history of renal transplant on chronic immunosuppressants.  Over the last month, he has had progressively worsening dysphagia.  He describes it as pain with swallowing.  He denies any regurgitation of food, only pain.  He has not been eating and drinking very much due to this.  He was recently hospitalized at [facility] for AKI related to this, but has not seen a GI physician.  He states that over the last several days, he is noticed slight increase in his pain and now mild dizziness.  He states he feels fine when he is lying down, but every time he tries to stand up, he experienced dizziness and lightheadedness.  He said no nausea or vomiting.  He has been urinating without difficulty.  Denies any increased pain over his transplant site.  No recent medication changes.  Past Medical History:  Diagnosis Date  . Arthritis    back, hands   . Chronic back pain    radiculopathy and stenosis  . Chronic kidney disease    [name], awaiting Transplant   . CVA (cerebral infarction) 03/2013  . Diabetes mellitus     Lantus nightly ;type 2  . GERD (gastroesophageal reflux disease)   . H/O hiatal hernia   . History of colon polyps   . Hx of cardiovascular stress test    a.  Lexiscan Myoview (05/2013):  No ischemia, EF 53%; Normal Study  . Hyperlipidemia    takes Lovastatin daily  . Hypertension    takes Amlodipine and Catapres daily    dr Forrestine Him, Loop Recorder - [facility]  . Nocturia   . Peripheral edema    takes Lasix daily  . Sleep apnea    cpap , study in their home,  09/2102- [employer]           . Stroke (HCC)    speech, rt arm weakness  . Urinary frequency     Patient Active Problem List   Diagnosis Date Noted  . Acute renal failure superimposed on stage 4 chronic kidney disease ([place]) 06/04/2017  . GERD (gastroesophageal reflux disease) 06/04/2017  . Peripheral edema 01/02/2017  . Finger infection, fungal 11/22/2016  . Dyspnea on exertion 08/24/2016  . HLD (hyperlipidemia) 05/02/2013  . OSA (obstructive sleep apnea) 05/02/2013  . History of stroke 03/16/2013  . Movement disorder 03/16/2013  . Lumbar stenosis with neurogenic claudication 12/27/2012  . Chronic back pain 01/27/2011  . HTN (hypertension) 01/27/2011  . History of kidney transplant 01/24/2011  . Hyperkalemia 01/23/2011  . Diabetes mellitus ([place]) 01/23/2011    Past Surgical History:  Procedure Laterality Date  . AV FISTULA PLACEMENT Left 04/03/2013   Procedure: ARTERIOVENOUS (AV) FISTULA CREATION- LEFT RADIOCEPHALIC VS BRACHIOCEPHALIC;  Surgeon: Conrad Bethel Park, MD;  Location: [place];  Service: Vascular;  Laterality: Left;  . AV FISTULA PLACEMENT Left 05/14/2013   Procedure: LEFT BRACHIOCEPHALIC ARTERIOVENOUS (AV) FISTULA CREATION;  Surgeon: Conrad Genola, MD;  Location: [place];  Service: Vascular;  Laterality: Left;  . BACK SURGERY  12/2012   2003- 1st back surgery &  then 2014- fusion  . COLONOSCOPY    . ESOPHAGOGASTRODUODENOSCOPY    . HEMORRHOID SURGERY    . KIDNEY TRANSPLANT  10/01/2013  . left knee surgery    . LOOP RECORDER IMPLANT  03/19/13   MDT LinQ implanted by Dr Rayann Heman for cryptogenic stroke  . LOOP RECORDER IMPLANT N/A 03/19/2013   Procedure: LOOP RECORDER IMPLANT;  Surgeon: Coralyn Mark, MD;  Location: [place] CATH LAB;  Service: Cardiovascular;  Laterality: N/A;  . NEPHRECTOMY TRANSPLANTED ORGAN    . NEUROPLASTY / TRANSPOSITION MEDIAN NERVE AT CARPAL TUNNEL BILATERAL    . right leg surgery     pin in place  . right wrist surgery    . TEE WITHOUT CARDIOVERSION N/A 03/19/2013     Procedure: TRANSESOPHAGEAL ECHOCARDIOGRAM (TEE);  Surgeon: [name], MD;  Location: [facility] ENDOSCOPY;  Service: Cardiovascular;  Laterality: N/A;        Home Medications    Prior to Admission medications   Medication Sig Start Date End Date Taking? Authorizing Provider  albuterol (PROVENTIL HFA;VENTOLIN HFA) 108 (90 Base) MCG/ACT inhaler Inhale 2 puffs into the lungs every 6 (six) hours as needed for wheezing or shortness of breath.   Yes [provider]  aspirin EC 81 MG tablet Take 81 mg by mouth daily.    Yes [provider]  atorvastatin (LIPITOR) 40 MG tablet Take 40 mg by mouth daily.    Yes [provider]  calcitRIOL (ROCALTROL) 0.25 MCG capsule Take 0.25 mcg by mouth See admin instructions. Take 1 capsule (0.25 mcg) by mouth five times weekly - Monday thru Friday   Yes [provider]  gabapentin (NEURONTIN) 300 MG capsule Take 300 mg by mouth 2 (two) times daily.   Yes [provider]  HYDROcodone-acetaminophen (NORCO) 10-325 MG tablet Take 1-2 tablets by mouth every 6 (six) hours as needed (pain).  12/26/16  Yes [provider]  insulin glargine (LANTUS) 100 unit/mL SOPN Inject 5 Units into the skin at bedtime.    Yes [provider]  insulin lispro (HUMALOG KWIKPEN) 100 UNIT/ML KiwkPen Inject 5-15 Units into the skin See admin instructions. Inject 5 units subcutaneously three times daily before meals adjusted per carb count - add 2 units for every 25 points   Yes [provider]  methimazole (TAPAZOLE) 5 MG tablet Take 5 mg by mouth daily. 06/29/17  Yes [provider]  metoprolol tartrate (LOPRESSOR) 25 MG tablet Take 12.5 mg by mouth 2 (two) times daily. 07/18/17  Yes [provider]  Multiple Vitamin (MULTIVITAMIN WITH MINERALS) TABS tablet Take 1 tablet by mouth daily.   Yes [provider]  mycophenolate (MYFORTIC) 360 MG TBEC EC tablet Take 360 mg by mouth 2 (two) times daily.    Yes [provider]  omeprazole (PRILOSEC) 20 MG capsule Take 20 mg by mouth daily.   Yes [provider]  predniSONE (DELTASONE) 5 MG tablet Take 5 mg by mouth daily with breakfast.   Yes [provider]  sulfamethoxazole-trimethoprim (BACTRIM,SEPTRA) 400-80 MG tablet Take 1 tablet by mouth every Monday, Wednesday, and Friday.    Yes [provider]  tacrolimus (PROGRAF) 1 MG capsule Take 2 mg by mouth 2 (two) times daily. 06/28/17  Yes [provider]  tamsulosin (FLOMAX) 0.4 MG CAPS capsule Take 0.4 mg by mouth daily after breakfast.   Yes [provider]  vitamin B-12 (CYANOCOBALAMIN) 1000 MCG tablet Take 1,000 mcg by mouth 2 (two) times daily.  Yes [provider]  cyclobenzaprine (FLEXERIL) 5 MG tablet Take 1 tablet (5 mg total) by mouth 2 (two) times daily as needed for up to 10 days for muscle spasms. 07/26/17 08/05/17  Duffy Bruce, MD  fluconazole (DIFLUCAN) 150 MG tablet Take 1 tablet (150 mg total) by mouth daily for 1 day. 07/26/17 07/27/17  Duffy Bruce, MD  furosemide (LASIX) 80 MG tablet Take 1 tablet (80 mg total) by mouth 2 (two) times daily. Patient not taking: Reported on 07/26/2017 06/06/17   Thurnell Lose, MD  magnesium oxide (MAG-OX) 400 MG tablet Take 400 mg by mouth daily.    [provider]  Potassium Citrate 15 MEQ (1620 MG) TBCR Take 1 tablet by mouth 3 (three) times daily.  09/21/16   [provider]  sucralfate (CARAFATE) 1 g tablet Take 1 tablet (1 g total) by mouth 4 (four) times daily -  with meals and at bedtime for 7 days. 07/26/17 08/02/17  Duffy Bruce, MD    Family History Family History  Problem Relation Age of Onset  . Hypertension Mother   . Hyperlipidemia Father   . Hypertension Father   . Heart disease Father        before age 35  . Renal Disease Father   . Diabetes Brother   . Hyperlipidemia Son     Social History Social History   Tobacco Use  . Smoking  status: Former Smoker    Packs/day: 0.50    Years: 40.00    Pack years: 20.00    Types: Cigarettes    Last attempt to quit: 12/27/2012    Years since quitting: 4.5  . Smokeless tobacco: Never Used  Substance Use Topics  . Alcohol use: No    Alcohol/week: 0.0 oz  . Drug use: No     Allergies   Lisinopril; Meloxicam; and Victoza [liraglutide]   Review of Systems Review of Systems  Constitutional: Positive for fatigue. Negative for chills and fever.  HENT: Positive for trouble swallowing. Negative for congestion and rhinorrhea.   Eyes: Negative for visual disturbance.  Respiratory: Negative for cough, shortness of breath and wheezing.   Cardiovascular: Negative for chest pain and leg swelling.  Gastrointestinal: Negative for abdominal pain, diarrhea, nausea and vomiting.  Genitourinary: Negative for dysuria and flank pain.  Musculoskeletal: Negative for neck pain and neck stiffness.  Skin: Negative for rash and wound.  Allergic/Immunologic: Negative for immunocompromised state.  Neurological: Positive for dizziness and weakness. Negative for syncope and headaches.  All other systems reviewed and are negative.    Physical Exam Updated Vital Signs BP (!) 146/78   Pulse 97   Temp 97.7 F (36.5 C) (Oral)   Resp 20   Ht 5\' 11"  (1.803 m)   Wt 96.6 kg (213 lb)   SpO2 100%   BMI 29.71 kg/m   Physical Exam  Constitutional: He is oriented to person, place, and time. He appears well-developed and well-nourished. No distress.  HENT:  Head: Normocephalic and atraumatic.  Mildly dry mucous membranes  Eyes: Conjunctivae are normal.  Neck: Neck supple.  No carotid bruit.  No appreciable thyromegaly.  Full, painless range of motion.  No appreciable hematomas.  Cardiovascular: Normal rate, regular rhythm and normal heart sounds. Exam reveals no friction rub.  No murmur heard. Pulmonary/Chest: Effort normal and breath sounds normal. No respiratory distress. He has no wheezes. He  has no rales.  Abdominal: Soft. Bowel sounds are normal. He exhibits no distension.  No tenderness over  the right lower quadrant transplant site.  Abdomen otherwise soft.  Musculoskeletal: He exhibits no edema.  Neurological: He is alert and oriented to person, place, and time. He exhibits normal muscle tone.  Skin: Skin is warm. Capillary refill takes less than 2 seconds.  Psychiatric: He has a normal mood and affect.  Nursing note and vitals reviewed.    ED Treatments / Results  Labs (all labs ordered are listed, but only abnormal results are displayed) Labs Reviewed  BASIC METABOLIC PANEL - Abnormal; Notable for the following components:      Result Value   Glucose, Bld 169 (*)    BUN 26 (*)    Creatinine, Ser 1.60 (*)    GFR calc non Af Amer 43 (*)    GFR calc Af Amer 50 (*)    All other components within normal limits  CBC - Abnormal; Notable for the following components:   Hemoglobin 12.7 (*)    Platelets 132 (*)    All other components within normal limits  URINALYSIS, ROUTINE W REFLEX MICROSCOPIC - Abnormal; Notable for the following components:   APPearance HAZY (*)    Specific Gravity, Urine 1.031 (*)    Glucose, UA 50 (*)    Protein, ur 30 (*)    All other components within normal limits  TSH - Abnormal; Notable for the following components:   TSH 0.013 (*)    All other components within normal limits  MAGNESIUM  HEPATIC FUNCTION PANEL  LIPASE, BLOOD  T4, FREE  TACROLIMUS LEVEL    EKG EKG Interpretation  Date/Time:  Wednesday July 26 2017 14:35:36 EDT Ventricular Rate:  114 PR Interval:  144 QRS Duration: 80 QT Interval:  318 QTC Calculation: 438 R Axis:   -6 Text Interpretation:  Sinus tachycardia Otherwise normal ECG Since last EKG, rate increased Otherwise no significant change Confirmed by Duffy Bruce [phone]) on 07/26/2017 7:23:30 PM   Radiology Ct Head Wo Contrast  Result Date: 07/26/2017 CLINICAL DATA:  Headache, difficulty swallowing  EXAM: CT HEAD WITHOUT CONTRAST TECHNIQUE: Contiguous axial images were obtained from the base of the skull through the vertex without intravenous contrast. COMPARISON:  MRI brain dated 03/16/2013 FINDINGS: Brain: No evidence of acute infarction, hemorrhage, hydrocephalus, extra-axial collection or mass lesion/mass effect. Encephalomalacic changes in the left parietal lobe (series 3/image 21), related to prior infarct. Mild subcortical white matter and periventricular small vessel ischemic changes. Vascular: Mild intracranial atherosclerosis. Skull: Normal. Negative for fracture or focal lesion. Sinuses/Orbits: The visualized paranasal sinuses are essentially clear. The mastoid air cells are unopacified. Other: None. IMPRESSION: No evidence of acute intracranial abnormality. Encephalomalacic changes related to prior left parietal infarct. Mild small vessel ischemic changes. Electronically Signed   By: Julian Hy M.D.   On: 07/26/2017 20:45   Ct Soft Tissue Neck Wo Contrast  Result Date: 07/26/2017 CLINICAL DATA:  Initial evaluation for difficulty swallowing, unexplained dysphagia, throat pain. Evaluate for esophageal obstruction, mass. EXAM: CT NECK WITHOUT CONTRAST TECHNIQUE: Multidetector CT imaging of the neck was performed following the standard protocol without intravenous contrast. COMPARISON:  None. FINDINGS: Pharynx and larynx: Oral contrast material present within the oral cavity, limiting evaluation. No obvious mass lesion or other abnormality. Patient is edentulous. No base of tongue lesion. Oropharynx and nasopharynx within normal limits. Parapharyngeal fat maintained. No retropharyngeal collection or mass. Epiglottis normal. Vallecula clear. No abnormality at the piriform sinuses. Pre epiglottic fat maintained. True cords symmetric and normal. Subglottic airway clear. Salivary glands: Salivary glands including the parotid  and submandibular glands are within normal limits. Thyroid: 2.1 cm  hypodense right thyroid nodule (series 3, image 85). Subcentimeter nodule at the inferior left thyroid lobe noted, of doubtful significance. Thyroid otherwise unremarkable. Lymph nodes: No pathologically enlarged lymph nodes identified within the neck. Vascular: Prominent vascular calcifications about the carotid bifurcations. ICAs partially medialized into the retropharyngeal space bilaterally. Atheromatous change noted about the aortic arch is well. Limited intracranial: Scattered vascular calcifications noted within the carotid siphons. Age-related cerebral and cerebellar atrophy noted within the visualized brain. Visualized orbits: Unremarkable. Mastoids and visualized paranasal sinuses: Small retention cysts noted within the left sphenoid sinus. Minimal layering opacity within the left maxillary sinus. Visualized paranasal sinuses otherwise clear. Mastoid air cells and middle ear cavities are clear. Skeleton: No acute osseus abnormality. No worrisome lytic or blastic osseous lesions. Prominent bulky anterior osteophytic spurring seen along the left anterolateral aspect of the cervical spine at the level of C5-6 and C6-7. Upper chest: Small amount of enteric contrast noted within the visualized upper esophagus. Visualized upper mediastinum otherwise unremarkable. Mild hazy atelectatic changes noted within the visualized lungs. Visualized lungs are otherwise clear. Other: None. IMPRESSION: 1. Bulky anterior osteophytic spurring at C5-6 and C6-7, which can sometimes be associated with dysphagia. 2. No other obstructive mass or other process identified within the neck. Follow-up examination with fluoroscopic swallow study and esophagram may be helpful for further evaluation as clinically warranted. 3. Multinodular thyroid with dominant 2.1 cm right thyroid nodule, better evaluated on prior thyroid ultrasound from 09/21/2015. 4. Moderate carotid bifurcation atherosclerosis. Electronically Signed   By: Jeannine Boga M.D.   On: 07/26/2017 21:10    Procedures Procedures (including critical care time)  Medications Ordered in ED Medications  sodium chloride 0.9 % bolus 1,000 mL (0 mLs Intravenous Stopped 07/26/17 1746)  gi cocktail (Maalox,Lidocaine,Donnatal) (30 mLs Oral Given 07/26/17 1903)  sodium chloride 0.9 % bolus 1,000 mL (0 mLs Intravenous Stopped 07/26/17 2059)  metoCLOPramide (REGLAN) injection 10 mg (10 mg Intravenous Given 07/26/17 2055)  diphenhydrAMINE (BENADRYL) injection 25 mg (25 mg Intravenous Given 07/26/17 2056)  diphenhydrAMINE (BENADRYL) injection 25 mg (25 mg Intravenous Given 07/26/17 2153)     Initial Impression / Assessment and Plan / ED Course  I have reviewed the triage vital signs and the nursing notes.  Pertinent labs & imaging results that were available during my care of the patient were reviewed by me and considered in my medical decision making (see chart for details).     67 year old male here with mild dizziness upon standing after having pain with eating.  This is an acute on chronic issue.  Regarding his dizziness, I suspect this is due to mild orthostasis.  He was given fluids with complete resolution in the ED.  His creatinine is trending down from his recent labs and AKI.  His lab work is otherwise at its baseline.  He has mild hematuria which is also baseline and no evidence of infection.  He feels improved after fluids.  Regarding his dysphagia, I discussed this with radiology.  He has no evidence of significant mass or abnormality on CT, though he does have significant anterior osteophytes at C3 and C4.  This correlates with his area of pain and per review of literature, could correlate with dysphasia.  While I see no apparent emergent pathologies, I think it is reasonable to refer him to his spine surgeon to discuss.  First, I suspect he will also need an EGD and I will place an  ambulatory referral to GI.  Of note, this also began after he was on some  antibiotics.  I do not see signs of infection but given his immune suppressed status, will trial a dose of Diflucan to see if there is possible candidal infection.  Otherwise, patient remains hemodynamically stable and well-appearing.  Will discharge.  Final Clinical Impressions(s) / ED Diagnoses   Final diagnoses:  Dehydration  Cervical osteophyte  Odynophagia    ED Discharge Orders        Ordered    sucralfate (CARAFATE) 1 g tablet  3 times daily with meals & bedtime     07/26/17 2157    cyclobenzaprine (FLEXERIL) 5 MG tablet  2 times daily PRN     07/26/17 2157    Ambulatory referral to Gastroenterology     07/26/17 2159    fluconazole (DIFLUCAN) 150 MG tablet  Daily     07/26/17 2159       Duffy Bruce, MD 07/26/17 2201

## 2017-07-26 NOTE — ED Provider Notes (Signed)
Patient placed in Quick Look pathway, seen and evaluated   Chief Complaint: dizziness  HPI:   Dizziness described as "things start moving" with standing up and walking onset today with nausea and dry heaving.  No vomiting. Admitted recently for orthostatic hypotension/hypovolemia at [facility], and thinks it may be the same.  Wife states he is struggling to keep PO intake adequate. One boost and 16 oz of water today, UOP 1000 cc.  Has been able to walk with cane with caution. H/o left kidney transplant 2015 on immunosuppression. Ho CVA.   ROS: No HA, CP, SOB, unilateral numbness or weakness.   Physical Exam:   Gen: No distress  Neuro: Awake and Alert  Skin: Warm   Focused Exam: MMM +left horizontal nystagmus Speech is fluent without obvious dysarthria or dysphasia. Strength 5/5 with hand grip and ankle F/E.   Sensation to light touch intact in hands and feet. No truncal sway. Normal finger-to-nose, finger tapping, heel to shin. CN I and VIII not tested. CN II-XII grossly intact bilaterally.     Initiation of care has begun. The patient has been counseled on the process, plan, and necessity for staying for the completion/evaluation, and the remainder of the medical screening examination    Kinnie Feil, PA-C 07/26/17 [place], [place], DO 07/26/17 [phone]

## 2017-07-26 NOTE — ED Triage Notes (Addendum)
Pt endorses dizziness with nausea every time he stands that began this morning. Hx of kidney transplant. Admitted at [facility] recently for dehydration and states this feels the same. Tachy in triage.

## 2017-07-26 NOTE — ED Notes (Signed)
Patient verbalizes understanding of discharge instructions. Opportunity for questioning and answers were provided. Armband removed by staff, pt discharged from ED ambulatory.   

## 2017-07-27 ENCOUNTER — Encounter: Payer: Self-pay | Admitting: Physician Assistant

## 2017-07-27 ENCOUNTER — Encounter: Payer: Self-pay | Admitting: Gastroenterology

## 2017-07-28 LAB — TACROLIMUS LEVEL: Tacrolimus (FK506) - LabCorp: 3.2 ng/mL (ref 2.0–20.0)

## 2017-07-31 DIAGNOSIS — R7989 Other specified abnormal findings of blood chemistry: Secondary | ICD-10-CM | POA: Diagnosis not present

## 2017-07-31 DIAGNOSIS — M2578 Osteophyte, vertebrae: Secondary | ICD-10-CM | POA: Diagnosis not present

## 2017-07-31 DIAGNOSIS — M5030 Other cervical disc degeneration, unspecified cervical region: Secondary | ICD-10-CM | POA: Diagnosis not present

## 2017-07-31 DIAGNOSIS — R1310 Dysphagia, unspecified: Secondary | ICD-10-CM | POA: Diagnosis not present

## 2017-08-04 DIAGNOSIS — I509 Heart failure, unspecified: Secondary | ICD-10-CM | POA: Diagnosis not present

## 2017-08-08 ENCOUNTER — Encounter: Payer: Self-pay | Admitting: *Deleted

## 2017-08-08 DIAGNOSIS — I639 Cerebral infarction, unspecified: Secondary | ICD-10-CM

## 2017-08-08 DIAGNOSIS — Z992 Dependence on renal dialysis: Secondary | ICD-10-CM

## 2017-08-08 DIAGNOSIS — M1990 Unspecified osteoarthritis, unspecified site: Secondary | ICD-10-CM

## 2017-08-08 HISTORY — DX: Cerebral infarction, unspecified: I63.9

## 2017-08-08 HISTORY — DX: Unspecified osteoarthritis, unspecified site: M19.90

## 2017-08-08 HISTORY — DX: Dependence on renal dialysis: Z99.2

## 2017-08-11 ENCOUNTER — Ambulatory Visit: Payer: Medicare Other | Admitting: Physician Assistant

## 2017-08-11 ENCOUNTER — Encounter: Payer: Self-pay | Admitting: Physician Assistant

## 2017-08-11 VITALS — BP 114/62 | HR 56 | Ht 71.0 in | Wt 216.4 lb

## 2017-08-11 DIAGNOSIS — D899 Disorder involving the immune mechanism, unspecified: Secondary | ICD-10-CM

## 2017-08-11 DIAGNOSIS — R1310 Dysphagia, unspecified: Secondary | ICD-10-CM

## 2017-08-11 DIAGNOSIS — D849 Immunodeficiency, unspecified: Secondary | ICD-10-CM

## 2017-08-11 NOTE — Progress Notes (Addendum)
Chief Complaint: Dysphagia, weight loss and early satiety  HPI:    Mr. Cozzolino is a 67 year old male, known to Dr. Lyndel Safe, with a past medical history as listed below including CVA, chronic kidney disease, recent echo 01/02/2017 with LVEF 60-65% and multiple others, who was referred to me by [name], MD for a complaint of dysphagia and early satiety.      07/26/2017 ER visit for dizziness and mild difficulty/pain with swallowing, CT of the neck showed bulky anterior osteophyte expiring at C5-6 and C6-7 which could be associated with dysphasia and no other obstructive mass.  Labs that day showed hemoglobin minimally decreased at 12.7 and CMP with a creatinine of 1.6 otherwise normal CMP, CBC, lipase, TSH.    Today, presents to clinic accompanied by his wife.  They explain that since being placed on a strong antibiotic for what sounds like a finger abscess back in October 2018 the patient has "gone downhill".  He has been in and out of the ER and had multiple hospital stays.  Today, they are describing a 30 pound weight loss over the past 2-3 months.  Patient tells me that he has trouble eating and drinking, both liquids and solids feel as though they get stuck midway down his throat and he feels full after 1-2 bites.  Also describes nausea and vomiting, typically in the mornings.  If he has not eaten it is just "dry heaves".  On Omeprazole 20 mg daily but has been on this for years.  He feels as though this "helps a little".    Denies fever, chills, melena, hematochezia or change in bowel habits, abdominal pain or symptoms that awaken him at night.  Past Medical History:  Diagnosis Date  . Abnormal gait   . Anemia of chronic disease   . Arthritis    back, hands   . Chronic back pain    radiculopathy and stenosis  . Chronic ischemic heart disease    Severe LVH  . Chronic kidney disease    [name], awaiting Transplant   . Congestive heart failure (CHF) ([place])   . CVA (cerebral  infarction) 03/2013  . Diabetes mellitus     Lantus nightly ;type 2  . GERD (gastroesophageal reflux disease)   . H/O hiatal hernia   . History of colon polyps   . Hx of cardiovascular stress test    a.  Lexiscan Myoview (05/2013):  No ischemia, EF 53%; Normal Study  . Hyperlipidemia    takes Lovastatin daily  . Hypertension    takes Amlodipine and Catapres daily    dr Forrestine Him, Loop [facility] - [facility]  . Nocturia   . Obesity   . Peripheral edema    takes Lasix daily  . Sleep apnea    cpap , study in their home, 09/2102- [facility]           . Stroke ([facility])    speech, rt arm weakness  . Urinary frequency     Past Surgical History:  Procedure Laterality Date  . AV FISTULA PLACEMENT Left 04/03/2013   Procedure: ARTERIOVENOUS (AV) FISTULA CREATION- LEFT RADIOCEPHALIC VS BRACHIOCEPHALIC;  Surgeon: Conrad Opdyke, MD;  Location: [place];  Service: Vascular;  Laterality: Left;  . AV FISTULA PLACEMENT Left 05/14/2013   Procedure: LEFT BRACHIOCEPHALIC ARTERIOVENOUS (AV) FISTULA CREATION;  Surgeon: Conrad Helper, MD;  Location: [facility];  Service: Vascular;  Laterality: Left;  . BACK SURGERY  12/2012   2003- 1st back surgery &  then 2014- fusion  . COLONOSCOPY    . ESOPHAGOGASTRODUODENOSCOPY    . HEMORRHOID SURGERY    . KIDNEY TRANSPLANT  10/01/2013  . left knee surgery    . LOOP RECORDER IMPLANT  03/19/13   MDT LinQ implanted by Dr Rayann Heman for cryptogenic stroke  . LOOP RECORDER IMPLANT N/A 03/19/2013   Procedure: LOOP RECORDER IMPLANT;  Surgeon: Coralyn Mark, MD;  Location: [facility] CATH LAB;  Service: Cardiovascular;  Laterality: N/A;  . NEPHRECTOMY TRANSPLANTED ORGAN    . NEUROPLASTY / TRANSPOSITION MEDIAN NERVE AT CARPAL TUNNEL BILATERAL    . right leg surgery     pin in place  . right wrist surgery    . TEE WITHOUT CARDIOVERSION N/A 03/19/2013   Procedure: TRANSESOPHAGEAL ECHOCARDIOGRAM (TEE);  Surgeon: [name], MD;  Location: [facility] ENDOSCOPY;  Service: Cardiovascular;  Laterality: N/A;    . THYROID SURGERY  10/13/2015   Performed at [facility] with surgical pathology revealed consistent with benign follicular nodule ([name] category II)    Current Outpatient Medications  Medication Sig Dispense Refill  . albuterol (PROVENTIL HFA;VENTOLIN HFA) 108 (90 Base) MCG/ACT inhaler Inhale 2 puffs into the lungs every 6 (six) hours as needed for wheezing or shortness of breath.    [name] aspirin EC 81 MG tablet Take 81 mg by mouth daily.     [name] atorvastatin (LIPITOR) 40 MG tablet Take 40 mg by mouth daily.     . calcitRIOL (ROCALTROL) 0.25 MCG capsule Take 0.25 mcg by mouth See admin instructions. Take 1 capsule (0.25 mcg) by mouth five times weekly - Monday thru Friday    . gabapentin (NEURONTIN) 300 MG capsule Take 300 mg by mouth 2 (two) times daily.    [name] HYDROcodone-acetaminophen (NORCO) 10-325 MG tablet Take 1-2 tablets by mouth every 6 (six) hours as needed (pain).     . insulin glargine (LANTUS) 100 unit/mL SOPN Inject 5 Units into the skin at bedtime.     . insulin lispro (HUMALOG KWIKPEN) 100 UNIT/ML KiwkPen Inject 5-15 Units into the skin See admin instructions. Inject 5 units subcutaneously three times daily before meals adjusted per carb count - add 2 units for every 25 points    . methimazole (TAPAZOLE) 5 MG tablet Take 5 mg by mouth daily.  3  . metoprolol tartrate (LOPRESSOR) 25 MG tablet Take 12.5 mg by mouth 2 (two) times daily.  0  . Multiple Vitamin (MULTIVITAMIN WITH MINERALS) TABS tablet Take 1 tablet by mouth daily.    . mycophenolate (MYFORTIC) 360 MG TBEC EC tablet Take 360 mg by mouth 2 (two) times daily.    [name] omeprazole (PRILOSEC) 20 MG capsule Take 20 mg by mouth daily.    . Potassium Citrate 15 MEQ (1620 MG) TBCR Take 1 tablet by mouth 3 (three) times daily.     . predniSONE (DELTASONE) 5 MG tablet Take 5 mg by mouth daily with breakfast.    . sulfamethoxazole-trimethoprim (BACTRIM,SEPTRA) 400-80 MG tablet Take 1 tablet by mouth every Monday, Wednesday, and Friday.     .  tacrolimus (PROGRAF) 1 MG capsule Take 2 mg by mouth 2 (two) times daily.  5  . tamsulosin (FLOMAX) 0.4 MG CAPS capsule Take 0.4 mg by mouth daily after breakfast.    . vitamin B-12 (CYANOCOBALAMIN) 1000 MCG tablet Take 1,000 mcg by mouth 2 (two) times daily.     . furosemide (LASIX) 80 MG tablet Take 1 tablet (80 mg total) by mouth 2 (two) times daily. (Patient not  taking: Reported on 07/26/2017) 90 tablet 0  . magnesium oxide (MAG-OX) 400 MG tablet Take 400 mg by mouth daily.     No current facility-administered medications for this visit.     Allergies as of 08/11/2017 - Review Complete 08/11/2017  Allergen Reaction Noted  . Lisinopril Other (See Comments) 11/14/2012  . Meloxicam Nausea And Vomiting 11/14/2012  . Victoza [liraglutide] Diarrhea, Nausea And Vomiting, and Swelling 12/18/2012    Family History  Problem Relation Age of Onset  . Hypertension Mother   . Hyperlipidemia Father   . Hypertension Father   . Heart disease Father        before age 46  . Renal Disease Father   . Diabetes Brother   . Hyperlipidemia Son     Social History   Socioeconomic History  . Marital status: Married    Spouse name: agnes  . Number of children: 5  . Years of education: 8th  . Highest education level: Not on file  Occupational History  . Occupation: disabled  Social Needs  . Financial resource strain: Not on file  . Food insecurity:    Worry: Not on file    Inability: Not on file  . Transportation needs:    Medical: Not on file    Non-medical: Not on file  Tobacco Use  . Smoking status: Former Smoker    Packs/day: 0.50    Years: 40.00    Pack years: 20.00    Types: Cigarettes    Last attempt to quit: 12/27/2012    Years since quitting: 4.6  . Smokeless tobacco: Never Used  Substance and Sexual Activity  . Alcohol use: No    Alcohol/week: 0.0 oz  . Drug use: No  . Sexual activity: Yes  Lifestyle  . Physical activity:    Days per week: Not on file    Minutes per  session: Not on file  . Stress: Not on file  Relationships  . Social connections:    Talks on phone: Not on file    Gets together: Not on file    Attends religious service: Not on file    Active member of club or organization: Not on file    Attends meetings of clubs or organizations: Not on file    Relationship status: Not on file  . Intimate partner violence:    Fear of current or ex partner: Not on file    Emotionally abused: Not on file    Physically abused: Not on file    Forced sexual activity: Not on file  Other Topics Concern  . Not on file  Social History Narrative   Patient lives at home with his wife Herbert Pun).  One story home with basement.   Patient is disabled.   Education 8th grade.   Right handed.   Caffeine One cup of coffee daily.   Retired Administrator.   5 children.    Review of Systems:    Constitutional: No weight loss, fever or chills Skin: No rash  Cardiovascular: No chest pain Respiratory: No SOB  Gastrointestinal: See HPI and otherwise negative Genitourinary: No dysuria  Neurological: No headache, dizziness or syncope Musculoskeletal: No new muscle or joint pain Hematologic: No bleeding  Psychiatric: No history of depression or anxiety   Physical Exam:  Vital signs: BP 114/62   Pulse (!) 56   Ht 5\' 11"  (1.803 m)   Wt 216 lb 6.4 oz (98.2 kg)   BMI 30.18 kg/m   Constitutional:  Pleasant Caucasian male appears to be in NAD, Well developed, Well nourished, alert and cooperative Head:  Normocephalic and atraumatic. Eyes:   PEERL, EOMI. No icterus. Conjunctiva pink. Ears:  Normal auditory acuity. Neck:  Supple Throat: Oral cavity and pharynx without inflammation, swelling or lesion.  Respiratory: Respirations even and unlabored. Lungs clear to auscultation bilaterally.   No wheezes, crackles, or rhonchi.  Cardiovascular: Normal S1, S2. No MRG. Regular rate and rhythm. No peripheral edema, cyanosis or pallor.  Gastrointestinal:  Soft,  nondistended, nontender. No rebound or guarding. Normal bowel sounds. No appreciable masses or hepatomegaly. Rectal:  Not performed.  Msk:  Symmetrical without gross deformities. Without edema, no deformity or joint abnormality. Uses walker to help ambulate Neurologic:  Alert and  oriented x4;  grossly normal neurologically.  Skin:   Dry and intact without significant lesions or rashes. Psychiatric: Demonstrates good judgement and reason without abnormal affect or behaviors.  RELEVANT LABS AND IMAGING: CBC    Component Value Date/Time   WBC 6.8 07/26/2017 1451   RBC 4.28 07/26/2017 1451   HGB 12.7 (L) 07/26/2017 1451   HCT 39.2 07/26/2017 1451   PLT 132 (L) 07/26/2017 1451   MCV 91.6 07/26/2017 1451   MCH 29.7 07/26/2017 1451   MCHC 32.4 07/26/2017 1451   RDW 12.8 07/26/2017 1451   LYMPHSABS 1.1 01/02/2017 0030   MONOABS 0.4 01/02/2017 0030   EOSABS 0.1 01/02/2017 0030   BASOSABS 0.0 01/02/2017 0030    CMP     Component Value Date/Time   NA 141 07/26/2017 1451   K 4.5 07/26/2017 1451   CL 110 07/26/2017 1451   CO2 23 07/26/2017 1451   GLUCOSE 169 (H) 07/26/2017 1451   BUN 26 (H) 07/26/2017 1451   CREATININE 1.60 (H) 07/26/2017 1451   CREATININE 2.21 (H) 12/22/2016 1442   CALCIUM 9.8 07/26/2017 1451   PROT 6.5 07/26/2017 1800   ALBUMIN 3.7 07/26/2017 1800   AST 24 07/26/2017 1800   ALT 39 07/26/2017 1800   ALKPHOS 65 07/26/2017 1800   BILITOT 0.7 07/26/2017 1800   GFRNONAA 43 (L) 07/26/2017 1451   GFRAA 50 (L) 07/26/2017 1451    Assessment: 1.  Dysphagia: Over the past 3 months or so, to solids and liquids, recent CT of the neck showing bone spurs, has also been on multiple antibiotics over the past 6 months and has a history of GERD; consider extrinsic compression versus stricture versus yeast infection versus other 2. GERD: moderately controlled on Omeprazole 20mg  qd 3. S/p Kidney transplant 2015  Plan: 1.  Scheduled patient for an EGD with likely dilation in  the [place] with Dr. Lyndel Safe.  Did discuss risk, benefits, limitations and alternatives and the patient agrees to proceed. 2.  Continue Omeprazole 20 mg daily.  This may need to be increased after time of procedure pending findings. 3.  Patient tells me he is able to transfer himself to a bed. 4.  Will try to retrieve records from Dr. Lyndel Safe previously-reports colonoscopy 5 yrs ago 5.  Patient to follow in clinic per recommendations from Dr. Lyndel Safe after time of procedures.  Ellouise Newer, PA-C [facility] 08/11/2017, 9:47 AM  Cc: [name], MD   Addendum: 08/11/2017  Requested records for patient, it turns out he saw Dr. Noberto Retort.  04/18/2013 colonoscopy for screening revealed a 15 mm polyp and several other small polyps throughout the colon.  Pathology shows hyperplastic polyp with no dysplasia or malignancy.  Repeat was recommended in 10 years.  Will let Dr. Lyndel Safe know. Ellouise Newer, PA-C

## 2017-08-11 NOTE — Patient Instructions (Signed)

## 2017-08-14 DIAGNOSIS — M25561 Pain in right knee: Secondary | ICD-10-CM | POA: Diagnosis not present

## 2017-08-15 ENCOUNTER — Encounter: Payer: Self-pay | Admitting: Cardiology

## 2017-08-15 ENCOUNTER — Ambulatory Visit: Payer: Medicare Other | Admitting: Cardiology

## 2017-08-15 VITALS — BP 98/64 | HR 84 | Ht 70.75 in | Wt 218.0 lb

## 2017-08-15 DIAGNOSIS — M7989 Other specified soft tissue disorders: Secondary | ICD-10-CM

## 2017-08-15 DIAGNOSIS — I10 Essential (primary) hypertension: Secondary | ICD-10-CM

## 2017-08-15 DIAGNOSIS — N184 Chronic kidney disease, stage 4 (severe): Secondary | ICD-10-CM | POA: Diagnosis not present

## 2017-08-15 DIAGNOSIS — Z8673 Personal history of transient ischemic attack (TIA), and cerebral infarction without residual deficits: Secondary | ICD-10-CM | POA: Diagnosis not present

## 2017-08-15 DIAGNOSIS — Z940 Kidney transplant status: Secondary | ICD-10-CM | POA: Diagnosis not present

## 2017-08-15 DIAGNOSIS — E0800 Diabetes mellitus due to underlying condition with hyperosmolarity without nonketotic hyperglycemic-hyperosmolar coma (NKHHC): Secondary | ICD-10-CM

## 2017-08-15 DIAGNOSIS — I272 Pulmonary hypertension, unspecified: Secondary | ICD-10-CM

## 2017-08-15 DIAGNOSIS — G4733 Obstructive sleep apnea (adult) (pediatric): Secondary | ICD-10-CM

## 2017-08-15 NOTE — Progress Notes (Signed)
Cardiology Consultation:    Date:  08/15/2017   ID:  Jon [name], DOB 01-02-1951, MRN [id number]  PCP:  [name], MD  Cardiologist:  [name], MD   Referring MD: [name], MD   Chief Complaint  Patient presents with  . Hospitalization Follow-up    Jon  I was recently in the hospital  History of Present Illness:    Jon [name] is a 67 y.o. male who is being seen today for the evaluation of pulmonary hypertension at the request of [name], MD.  67 year old gentleman with past medical history significant for renal transplant is been doing quite well however lately he ended up going to the hospital few times because of dehydration he was taking quite significant amount of diuretic dose being discontinued since that time he is feeling better still describe to have some evidence of orthostatic hypotension when he gets up very quickly he gets dizzy today blood pressure is 90/60.  Denies have any palpitations no chest pain tightness squeezing pressure burning chest.  Echocardiogram was done while he was in the hospital which revealed pulmonary hypertension with pulmonary pressure 48 mmHg.  Did switch criteria of mild pulmonary hypertension however he is here to talk about that he does have sleep apnea to be managed with CPAP however his has not seen any doctor for a long period of time.  Past Medical History:  Diagnosis Date  . Abnormal gait   . Anemia of chronic disease   . Arthritis    back, hands   . Chronic back pain    radiculopathy and stenosis  . Chronic ischemic heart disease    Severe LVH  . Chronic kidney disease    [name], awaiting Transplant   . Congestive heart failure (CHF) ([place])   . CVA (cerebral infarction) 03/2013  . Diabetes mellitus     Lantus nightly ;type 2  . GERD (gastroesophageal reflux disease)   . H/O hiatal hernia   . History of colon polyps   . Hx of cardiovascular stress test    a.  Lexiscan  Myoview (05/2013):  No ischemia, EF 53%; Normal Study  . Hyperlipidemia    takes Lovastatin daily  . Hypertension    takes Amlodipine and Catapres daily    dr Forrestine Him, Loop Recorder - [facility]  . Nocturia   . Obesity   . Peripheral edema    takes Lasix daily  . Sleep apnea    cpap , study in their home, 09/2102- [facility]           . Stroke (HCC)    speech, rt arm weakness  . Urinary frequency     Past Surgical History:  Procedure Laterality Date  . AV FISTULA PLACEMENT Left 04/03/2013   Procedure: ARTERIOVENOUS (AV) FISTULA CREATION- LEFT RADIOCEPHALIC VS BRACHIOCEPHALIC;  Surgeon: Conrad Farmers Loop, MD;  Location: [facility];  Service: Vascular;  Laterality: Left;  . AV FISTULA PLACEMENT Left 05/14/2013   Procedure: LEFT BRACHIOCEPHALIC ARTERIOVENOUS (AV) FISTULA CREATION;  Surgeon: Conrad Algona, MD;  Location: [facility];  Service: Vascular;  Laterality: Left;  . BACK SURGERY  12/2012   2003- 1st back surgery & then 2014- fusion  . COLONOSCOPY    . ESOPHAGOGASTRODUODENOSCOPY    . HEMORRHOID SURGERY    . KIDNEY TRANSPLANT  10/01/2013  . left knee surgery    . LOOP RECORDER IMPLANT  03/19/13   MDT LinQ implanted by Dr Rayann Heman  for cryptogenic stroke  . LOOP RECORDER IMPLANT N/A 03/19/2013   Procedure: LOOP RECORDER IMPLANT;  Surgeon: Coralyn Mark, MD;  Location: [facility] CATH [facility];  Service: Cardiovascular;  Laterality: N/A;  . NEPHRECTOMY TRANSPLANTED ORGAN    . NEUROPLASTY / TRANSPOSITION MEDIAN NERVE AT CARPAL TUNNEL BILATERAL    . right leg surgery     pin in place  . right wrist surgery    . TEE WITHOUT CARDIOVERSION N/A 03/19/2013   Procedure: TRANSESOPHAGEAL ECHOCARDIOGRAM (TEE);  Surgeon: [name], MD;  Location: [facility] ENDOSCOPY;  Service: Cardiovascular;  Laterality: N/A;  . THYROID SURGERY  10/13/2015   Performed at [facility] with surgical pathology revealed consistent with benign follicular nodule ([name] category II)    Current Medications: Current Meds  Medication Sig  . albuterol  (PROVENTIL HFA;VENTOLIN HFA) 108 (90 Base) MCG/ACT inhaler Inhale 2 puffs into the lungs every 6 (six) hours as needed for wheezing or shortness of breath.  [name] aspirin EC 81 MG tablet Take 81 mg by mouth daily.   [name] atorvastatin (LIPITOR) 40 MG tablet Take 40 mg by mouth daily.   . calcitRIOL (ROCALTROL) 0.25 MCG capsule Take 0.25 mcg by mouth See admin instructions. Take 1 capsule (0.25 mcg) by mouth five times weekly - Monday thru Friday  . gabapentin (NEURONTIN) 300 MG capsule Take 300 mg by mouth 2 (two) times daily.  [name] HYDROcodone-acetaminophen (NORCO) 10-325 MG tablet Take 1-2 tablets by mouth every 6 (six) hours as needed (pain).   . insulin glargine (LANTUS) 100 unit/mL SOPN Inject 5 Units into the skin at bedtime.   . insulin lispro (HUMALOG KWIKPEN) 100 UNIT/ML KiwkPen Inject 5-15 Units into the skin See admin instructions. Inject 5 units subcutaneously three times daily before meals adjusted per carb count - add 2 units for every 25 points  . methimazole (TAPAZOLE) 5 MG tablet Take 5 mg by mouth daily.  . metoprolol tartrate (LOPRESSOR) 25 MG tablet Take 12.5 mg by mouth 2 (two) times daily.  . Multiple Vitamin (MULTIVITAMIN WITH MINERALS) TABS tablet Take 1 tablet by mouth daily.  . mycophenolate (MYFORTIC) 360 MG TBEC EC tablet Take 360 mg by mouth 2 (two) times daily.  [name] omeprazole (PRILOSEC) 20 MG capsule Take 20 mg by mouth daily.  . predniSONE (DELTASONE) 5 MG tablet Take 5 mg by mouth daily with breakfast.  . sulfamethoxazole-trimethoprim (BACTRIM,SEPTRA) 400-80 MG tablet Take 1 tablet by mouth every Monday, Wednesday, and Friday.   . tacrolimus (PROGRAF) 1 MG capsule Take 2 mg by mouth 2 (two) times daily.  . tamsulosin (FLOMAX) 0.4 MG CAPS capsule Take 0.4 mg by mouth daily after breakfast.  . vitamin B-12 (CYANOCOBALAMIN) 1000 MCG tablet Take 1,000 mcg by mouth 2 (two) times daily.      Allergies:   Lisinopril; Meloxicam; and Victoza [liraglutide]   Social History    Socioeconomic History  . Marital status: Married    Spouse name: agnes  . Number of children: 5  . Years of education: 8th  . Highest education level: Not on file  Occupational History  . Occupation: disabled  Social Needs  . Financial resource strain: Not on file  . Food insecurity:    Worry: Not on file    Inability: Not on file  . Transportation needs:    Medical: Not on file    Non-medical: Not on file  Tobacco Use  . Smoking status: Former Smoker    Packs/day: 0.50    Years: 40.00  Pack years: 20.00    Types: Cigarettes    Last attempt to quit: 12/27/2012    Years since quitting: 4.6  . Smokeless tobacco: Never Used  Substance and Sexual Activity  . Alcohol use: No    Alcohol/week: 0.0 oz  . Drug use: No  . Sexual activity: Yes  Lifestyle  . Physical activity:    Days per week: Not on file    Minutes per session: Not on file  . Stress: Not on file  Relationships  . Social connections:    Talks on phone: Not on file    Gets together: Not on file    Attends religious service: Not on file    Active member of club or organization: Not on file    Attends meetings of clubs or organizations: Not on file    Relationship status: Not on file  Other Topics Concern  . Not on file  Social History Narrative   Patient lives at home with his wife Herbert Pun).  One story home with basement.   Patient is disabled.   Education 8th grade.   Right handed.   Caffeine One cup of coffee daily.   Retired Administrator.   5 children.     Family History: The patient's family history includes Diabetes in his brother; Heart disease in his father; Hyperlipidemia in his father and son; Hypertension in his father and mother; Renal Disease in his father. ROS:   Please see the history of present illness.    All 14 point review of systems negative except as described per history of present illness.  EKGs/Labs/Other Studies Reviewed:    The following studies were reviewed  today: Echocardiogram done on December 24  - Left ventricle: The cavity size was normal. There was moderate   concentric hypertrophy. Systolic function was normal. The   estimated ejection fraction was in the range of 60% to 65%. Wall   motion was normal; there were no regional wall motion   abnormalities. Features are consistent with a pseudonormal left   ventricular filling pattern, with concomitant abnormal relaxation   and increased filling pressure (grade 2 diastolic dysfunction).   Doppler parameters are consistent with high ventricular filling   pressure. - Mitral valve: Calcified annulus. Valve area by continuity   equation (using LVOT flow): 1.74 cm^2. - Left atrium: The atrium was mildly dilated. - Right ventricle: Systolic function was mildly reduced. - Pulmonary arteries: PA peak pressure: 42 mm Hg (S).   Recent Labs: 06/18/2017: B Natriuretic Peptide 131.8 07/26/2017: ALT 39; BUN 26; Creatinine, Ser 1.60; Hemoglobin 12.7; Magnesium 1.9; Platelets 132; Potassium 4.5; Sodium 141; TSH 0.013  Recent Lipid Panel    Component Value Date/Time   CHOL 137 01/02/2017 0343   TRIG 81 01/02/2017 0343   HDL 38 (L) 01/02/2017 0343   CHOLHDL 3.6 01/02/2017 0343   VLDL 16 01/02/2017 0343   LDLCALC 83 01/02/2017 0343    Physical Exam:    VS:  BP 98/64 (BP Location: Right Arm, Patient Position: Sitting, Cuff Size: Normal)   Pulse 84   Ht 5' 10.75" (1.797 m)   Wt 218 lb (98.9 kg)   SpO2 94%   BMI 30.62 kg/m     Wt Readings from Last 3 Encounters:  08/15/17 218 lb (98.9 kg)  08/11/17 216 lb 6.4 oz (98.2 kg)  07/26/17 213 lb (96.6 kg)     GEN:  Well nourished, well developed in no acute distress HEENT: Normal NECK: No JVD; No  carotid bruits LYMPHATICS: No lymphadenopathy CARDIAC: RRR, no murmurs, no rubs, no gallops RESPIRATORY:  Clear to auscultation without rales, wheezing or rhonchi  ABDOMEN: Soft, non-tender, non-distended MUSCULOSKELETAL:  No edema; No deformity   SKIN: Warm and dry NEUROLOGIC:  Alert and oriented x 3 PSYCHIATRIC:  Normal affect   ASSESSMENT:    1. Pulmonary hypertension ([place])   2. Essential hypertension   3. OSA (obstructive sleep apnea)   4. Diabetes mellitus due to underlying condition with hyperosmolarity without coma, without long-term current use of insulin ([place])   5. History of stroke   6. Swelling of lower extremity    PLAN:    In order of problems listed above:  1. Pulmonary hypertension: Obviously concerning I will repeat his echocardiogram to recheck in pulmonary pressure.  I will also try to get him with pulmonary people sooner today can check make sure his CPAP mask is adjusted properly.  In the future he may require a right-sided cardiac catheterization to check to evaluate pulmonary pressure. 2. Essential hypertension blood pressure appears to be well controlled today actually is low and asking to discontinue metoprolol. 3. Obstructive sleep apnea followed by pulmonary but required follow-up appointment to reassess control of this problem. 4. Diabetes mellitus: Stable with appropriate hemoglobin A1c 5. History of stroke: Stable 6. Swelling of lower extremities none now we have the opposite problem he had difficulty eating and drinking because of some problem with his esophagus and he is being investigated by GI specialist.   Medication Adjustments/Labs and Tests Ordered: Current medicines are reviewed at length with the patient today.  Concerns regarding medicines are outlined above.  No orders of the defined types were placed in this encounter.  No orders of the defined types were placed in this encounter.   Signed, Park Liter, MD, [facility] 08/15/2017 4:37 PM    [place]

## 2017-08-15 NOTE — Patient Instructions (Addendum)
Medication Instructions:  Your physician has recommended you make the following change in your medication:   STOP: Metoprolol.   Labwork: None.  Testing/Procedures: Your physician has requested that you have an echocardiogram. Echocardiography is a painless test that uses sound waves to create images of your heart. It provides your doctor with information about the size and shape of your heart and how well your heart's chambers and valves are working. This procedure takes approximately one hour. There are no restrictions for this procedure.    Follow-Up:Your physician wants you to follow-up in:  2 months. You will receive a reminder letter in the mail two months in advance. If you don't receive a letter, please call our office to schedule the follow-up appointment.   Any Other Special Instructions Will Be Listed Below (If Applicable).     If you need a refill on your cardiac medications before your next appointment, please call your pharmacy.  Echocardiogram An echocardiogram, or echocardiography, uses sound waves (ultrasound) to produce an image of your heart. The echocardiogram is simple, painless, obtained within a short period of time, and offers valuable information to your health care provider. The images from an echocardiogram can provide information such as:  Evidence of coronary artery disease (CAD).  Heart size.  Heart muscle function.  Heart valve function.  Aneurysm detection.  Evidence of a past heart attack.  Fluid buildup around the heart.  Heart muscle thickening.  Assess heart valve function.  Tell a health care provider about:  Any allergies you have.  All medicines you are taking, including vitamins, herbs, eye drops, creams, and over-the-counter medicines.  Any problems you or family members have had with anesthetic medicines.  Any blood disorders you have.  Any surgeries you have had.  Any medical conditions you have.  Whether you are  pregnant or may be pregnant. What happens before the procedure? No special preparation is needed. Eat and drink normally. What happens during the procedure?  In order to produce an image of your heart, gel will be applied to your chest and a wand-like tool (transducer) will be moved over your chest. The gel will help transmit the sound waves from the transducer. The sound waves will harmlessly bounce off your heart to allow the heart images to be captured in real-time motion. These images will then be recorded.  You may need an IV to receive a medicine that improves the quality of the pictures. What happens after the procedure? You may return to your normal schedule including diet, activities, and medicines, unless your health care provider tells you otherwise. This information is not intended to replace advice given to you by your health care provider. Make sure you discuss any questions you have with your health care provider. Document Released: 12/25/1999 Document Revised: 08/15/2015 Document Reviewed: 09/03/2012 Elsevier Interactive Patient Education  2017 [employer].

## 2017-08-16 ENCOUNTER — Telehealth: Payer: Self-pay | Admitting: Internal Medicine

## 2017-08-16 NOTE — Telephone Encounter (Signed)
Called [facility] and spoke with Hayleigh. Patient seen by Dr Agustin Cree yesterday 08/15/17 - concerned with patient pulmonary hypertension and would like patient to be seen sooner than his already scheduled 10/30/17 appt.  Dr Annamaria Boots - you have no openings until November.  Patient's chart does not indicate that this office has assessed/treated patient's pulmonary hypertension Patient has been treated by this office for his OSA and Dyspnea on Exertion  May patient be worked in on your schedule, or is APP appropriate?  Thank you. **no call back needed, may send staff message back to Lucila Maine with appt info**  Last office visit 12.19.18 w/ CY: Patient Instructions  Order- [facility]- order replacement CPAP mask of choice, supplies,     Dx OSA   Next year, when you are eligible, we will get [facility] to get you a replacement CPAP machine   Sample x 2 Anoro inhaler      Inhale 1 puff, once daily maintenance When you see your eye doctor January 10, they can recheck you for glaucoma to make sure the Anoro isn't making it worse.   Please call as needed

## 2017-08-16 NOTE — Telephone Encounter (Signed)
Dr. Lamonte Sakai or Dr. Lake Bells, would one of you be willing to take on this newly diagnosed pulmonary hypertension patient? Please advise. Thanks!

## 2017-08-16 NOTE — Telephone Encounter (Signed)
It would be appropriate to get Jon [name] back with one of our other pulmonologists for this new problem of pulmonary hypertension. I can continue to follow for OSA.

## 2017-08-16 NOTE — Telephone Encounter (Signed)
Spoke with patient's spouse Herbert Pun. She is aware of CY's recommendations. Patient has been scheduled with BQ for 09/13/17 at 330 as a consult. She stated that the [facility] office would be more convenient for them. '  Nothing further needed at time of call.

## 2017-08-16 NOTE — Telephone Encounter (Signed)
I'm happy to see him.

## 2017-08-17 DIAGNOSIS — E0590 Thyrotoxicosis, unspecified without thyrotoxic crisis or storm: Secondary | ICD-10-CM | POA: Diagnosis not present

## 2017-08-22 DIAGNOSIS — N2581 Secondary hyperparathyroidism of renal origin: Secondary | ICD-10-CM | POA: Diagnosis not present

## 2017-08-22 DIAGNOSIS — I10 Essential (primary) hypertension: Secondary | ICD-10-CM | POA: Diagnosis not present

## 2017-08-22 DIAGNOSIS — I129 Hypertensive chronic kidney disease with stage 1 through stage 4 chronic kidney disease, or unspecified chronic kidney disease: Secondary | ICD-10-CM | POA: Diagnosis not present

## 2017-08-22 DIAGNOSIS — M47812 Spondylosis without myelopathy or radiculopathy, cervical region: Secondary | ICD-10-CM | POA: Diagnosis not present

## 2017-08-22 DIAGNOSIS — Z940 Kidney transplant status: Secondary | ICD-10-CM | POA: Diagnosis not present

## 2017-08-22 DIAGNOSIS — R809 Proteinuria, unspecified: Secondary | ICD-10-CM | POA: Diagnosis not present

## 2017-08-22 DIAGNOSIS — R1310 Dysphagia, unspecified: Secondary | ICD-10-CM

## 2017-08-22 DIAGNOSIS — E1129 Type 2 diabetes mellitus with other diabetic kidney complication: Secondary | ICD-10-CM | POA: Diagnosis not present

## 2017-08-22 HISTORY — DX: Dysphagia, unspecified: R13.10

## 2017-08-28 DIAGNOSIS — G4733 Obstructive sleep apnea (adult) (pediatric): Secondary | ICD-10-CM | POA: Diagnosis not present

## 2017-08-28 NOTE — Progress Notes (Signed)
Thanks for seeing this patient and setting up for EGD Please review of the prev colonoscopy report - this note says 15 cm polyp. Was it 15 mm?

## 2017-09-04 DIAGNOSIS — I509 Heart failure, unspecified: Secondary | ICD-10-CM | POA: Diagnosis not present

## 2017-09-13 ENCOUNTER — Encounter: Payer: Self-pay | Admitting: Pulmonary Disease

## 2017-09-13 ENCOUNTER — Ambulatory Visit: Payer: Medicare Other | Admitting: Pulmonary Disease

## 2017-09-13 VITALS — BP 148/78 | HR 106 | Ht 69.37 in | Wt 214.2 lb

## 2017-09-13 DIAGNOSIS — Z23 Encounter for immunization: Secondary | ICD-10-CM | POA: Diagnosis not present

## 2017-09-13 DIAGNOSIS — G4733 Obstructive sleep apnea (adult) (pediatric): Secondary | ICD-10-CM

## 2017-09-13 DIAGNOSIS — I272 Pulmonary hypertension, unspecified: Secondary | ICD-10-CM | POA: Diagnosis not present

## 2017-09-13 DIAGNOSIS — F17200 Nicotine dependence, unspecified, uncomplicated: Secondary | ICD-10-CM

## 2017-09-13 DIAGNOSIS — N186 End stage renal disease: Secondary | ICD-10-CM

## 2017-09-13 DIAGNOSIS — I517 Cardiomegaly: Secondary | ICD-10-CM

## 2017-09-13 NOTE — Patient Instructions (Signed)
Pulmonary hypertension: This is the medical term that means that the blood pressure in your lungs is higher than it should be In your particular case I think it is due to the fact that your left heart is thick and has a difficult time relaxing thereby increasing the pressure in your lungs I think the obstructive sleep apnea also contributes to this We will check a lung function test to make sure there is no evidence of a new lung problem We will check your oxygen level while walking today in clinic   Left ventricular hypertrophy: This term means that the left side of your heart is too thick and has a difficult time relaxing. I would be very interested to see the repeat echocardiogram as arranged by your cardiologist  Obstructive sleep apnea: Keep using CPAP nightly Follow-up with Dr. Annamaria Boots as previously arranged in October of this year  We will see you back in about 8 weeks to go over the results of the echocardiogram that will be performed by your cardiologist.

## 2017-09-13 NOTE — Progress Notes (Signed)
Synopsis: OSA, history of renal transplant 2015; found to have pulmonary hypertension on an echo in December 2018  Subjective:   PATIENT ID: Jon [name] GENDER: male DOB: 01-Jan-1951, MRN: [id number]   HPI  Chief Complaint  Patient presents with  . New Consult    new dx pulmonary hypertension   [name] is here to see me for the first time for pulmonary hypertension.  He has been seen multiple times in the hospital recently for "dehydration". He has also been told that he has an obstruction ("bone spur") causing his throat to be occluded.  He has been told during this time that he has pulmonary hypertension.    He has some dysphagia and says that sometimes pills or crackers will get hung up in his throat.  He says that sometimes he will cough and bring up mucus when chokes on his own saliva.   He says taht his breathing has been about the same, but he has noticed dyspnea on exertion when he walks.  He thinks that he hasn't been very active due to his hospitalizations.  He has obstructive sleep apnea and he says that he uses his CPAP machine every night.  No new chest congestion or mucus production.  He has never been told he has a lung problem.  He smoked 1ppd for 45 years.  He quit smoking in 2014.    He drove a truck for 40 years. He used to work in a [employer] and also worked in [employer].    Past Medical History:  Diagnosis Date  . Abnormal gait   . Anemia of chronic disease   . Arthritis    back, hands   . Chronic back pain    radiculopathy and stenosis  . Chronic ischemic heart disease    Severe LVH  . Chronic kidney disease    [name], awaiting Transplant   . Congestive heart failure (CHF) ([place])   . CVA (cerebral infarction) 03/2013  . Diabetes mellitus     Lantus nightly ;type 2  . GERD (gastroesophageal reflux disease)   . H/O hiatal hernia   . History of colon polyps   . Hx of cardiovascular stress test    a.  Lexiscan Myoview (05/2013):  No  ischemia, EF 53%; Normal Study  . Hyperlipidemia    takes Lovastatin daily  . Hypertension    takes Amlodipine and Catapres daily    dr Forrestine Him, [facility]  . Nocturia   . Obesity   . Peripheral edema    takes Lasix daily  . Sleep apnea    cpap , study in their home, 09/2102- [facility]           . Stroke (HCC)    speech, rt arm weakness  . Urinary frequency      Family History  Problem Relation Age of Onset  . Hypertension Mother   . Hyperlipidemia Father   . Hypertension Father   . Heart disease Father        before age 93  . Renal Disease Father   . Diabetes Brother   . Hyperlipidemia Son      Social History   Socioeconomic History  . Marital status: Married    Spouse name: agnes  . Number of children: 5  . Years of education: 8th  . Highest education level: Not on file  Occupational History  . Occupation: disabled  Social Needs  . Financial resource strain: Not on file  .  Food insecurity:    Worry: Not on file    Inability: Not on file  . Transportation needs:    Medical: Not on file    Non-medical: Not on file  Tobacco Use  . Smoking status: Former Smoker    Packs/day: 0.50    Years: 40.00    Pack years: 20.00    Types: Cigarettes    Last attempt to quit: 12/27/2012    Years since quitting: 4.7  . Smokeless tobacco: Never Used  Substance and Sexual Activity  . Alcohol use: No    Alcohol/week: 0.0 standard drinks  . Drug use: No  . Sexual activity: Yes  Lifestyle  . Physical activity:    Days per week: Not on file    Minutes per session: Not on file  . Stress: Not on file  Relationships  . Social connections:    Talks on phone: Not on file    Gets together: Not on file    Attends religious service: Not on file    Active member of club or organization: Not on file    Attends meetings of clubs or organizations: Not on file    Relationship status: Not on file  . Intimate partner violence:    Fear of current or ex partner: Not  on file    Emotionally abused: Not on file    Physically abused: Not on file    Forced sexual activity: Not on file  Other Topics Concern  . Not on file  Social History Narrative   Patient lives at home with his wife Herbert Pun).  One story home with basement.   Patient is disabled.   Education 8th grade.   Right handed.   Caffeine One cup of coffee daily.   Retired Administrator.   5 children.     Allergies  Allergen Reactions  . Lisinopril Other (See Comments)    Increased potassium level   . Meloxicam Nausea And Vomiting  . Victoza [Liraglutide] Diarrhea, Nausea And Vomiting and Swelling     Outpatient Medications Prior to Visit  Medication Sig Dispense Refill  . albuterol (PROVENTIL HFA;VENTOLIN HFA) 108 (90 Base) MCG/ACT inhaler Inhale 2 puffs into the lungs every 6 (six) hours as needed for wheezing or shortness of breath.    [name] aspirin EC 81 MG tablet Take 81 mg by mouth daily.     [name] atorvastatin (LIPITOR) 40 MG tablet Take 40 mg by mouth daily.     . calcitRIOL (ROCALTROL) 0.25 MCG capsule Take 0.25 mcg by mouth See admin instructions. Take 1 capsule (0.25 mcg) by mouth five times weekly - Monday thru Friday    . gabapentin (NEURONTIN) 300 MG capsule Take 300 mg by mouth 2 (two) times daily.    [name] HYDROcodone-acetaminophen (NORCO) 10-325 MG tablet Take 1-2 tablets by mouth every 6 (six) hours as needed (pain).     . insulin glargine (LANTUS) 100 unit/mL SOPN Inject 5 Units into the skin at bedtime.     . insulin lispro (HUMALOG KWIKPEN) 100 UNIT/ML KiwkPen Inject 5-15 Units into the skin See admin instructions. Inject 5 units subcutaneously three times daily before meals adjusted per carb count - add 2 units for every 25 points    . methimazole (TAPAZOLE) 5 MG tablet Take 5 mg by mouth daily.  3  . Multiple Vitamin (MULTIVITAMIN WITH MINERALS) TABS tablet Take 1 tablet by mouth daily.    . mycophenolate (MYFORTIC) 360 MG TBEC EC tablet Take 360 mg by  mouth 2 (two) times daily.     [name] omeprazole (PRILOSEC) 20 MG capsule Take 20 mg by mouth daily.    . predniSONE (DELTASONE) 5 MG tablet Take 5 mg by mouth daily with breakfast.    . sulfamethoxazole-trimethoprim (BACTRIM,SEPTRA) 400-80 MG tablet Take 1 tablet by mouth every Monday, Wednesday, and Friday.     . tacrolimus (PROGRAF) 1 MG capsule Take 2 mg by mouth 2 (two) times daily.  5  . tamsulosin (FLOMAX) 0.4 MG CAPS capsule Take 0.4 mg by mouth daily after breakfast.    . vitamin B-12 (CYANOCOBALAMIN) 1000 MCG tablet Take 1,000 mcg by mouth 2 (two) times daily.     . furosemide (LASIX) 80 MG tablet Take 1 tablet (80 mg total) by mouth 2 (two) times daily. (Patient not taking: Reported on 07/26/2017) 90 tablet 0  . magnesium oxide (MAG-OX) 400 MG tablet Take 400 mg by mouth daily.    . Potassium Citrate 15 MEQ (1620 MG) TBCR Take 1 tablet by mouth 3 (three) times daily.      No facility-administered medications prior to visit.     Review of Systems  Constitutional: Negative for chills, fever, malaise/fatigue and weight loss.  HENT: Negative for congestion, nosebleeds, sinus pain and sore throat.   Eyes: Negative for photophobia, pain and discharge.  Respiratory: Positive for shortness of breath. Negative for cough, hemoptysis, sputum production and wheezing.   Cardiovascular: Negative for chest pain, palpitations, orthopnea and leg swelling.  Gastrointestinal: Negative for abdominal pain, constipation, diarrhea, nausea and vomiting.  Genitourinary: Negative for dysuria, frequency, hematuria and urgency.  Musculoskeletal: Negative for back pain, joint pain, myalgias and neck pain.  Skin: Negative for itching and rash.  Neurological: Negative for tingling, tremors, sensory change, speech change, focal weakness, seizures, weakness and headaches.  Psychiatric/Behavioral: Negative for memory loss, substance abuse and suicidal ideas ([place]). The patient is not nervous/anxious.       Objective:  Physical Exam   Vitals:    09/13/17 1534  BP: (!) 148/78  Pulse: (!) 106  SpO2: 98%  Weight: 214 lb 3.2 oz (97.2 kg)  Height: 5' 9.37" (1.762 m)    RA  Gen: chronically ill appearing, no acute distress HENT: NCAT, OP clear, neck supple without masses Eyes: PERRL, EOMi Lymph: no cervical lymphadenopathy PULM: CTA B CV: RRR, systolic murmur, no JVD GI: BS+, soft, nontender, no hsm Derm: no rash or skin breakdown MSK: normal bulk and tone Neuro: A&Ox4, CN II-XII intact, strength 5/5 in all 4 extremities Psyche: normal mood and affect   CBC    Component Value Date/Time   WBC 6.8 07/26/2017 1451   RBC 4.28 07/26/2017 1451   HGB 12.7 (L) 07/26/2017 1451   HCT 39.2 07/26/2017 1451   PLT 132 (L) 07/26/2017 1451   MCV 91.6 07/26/2017 1451   MCH 29.7 07/26/2017 1451   MCHC 32.4 07/26/2017 1451   RDW 12.8 07/26/2017 1451   LYMPHSABS 1.1 01/02/2017 0030   MONOABS 0.4 01/02/2017 0030   EOSABS 0.1 01/02/2017 0030   BASOSABS 0.0 01/02/2017 0030     Chest imaging: 06/2017 cardiomegaly, normal pulmonary parenchyma without infiltrate or mass, images personally reviewed June 2019 CT chest from [facility] showed "central airway thickening", platelike atelectasis in the left lower lobe, vertebral body changes consistent with DISH there was a question of bronchial thickening due to bronchitis  PFT: 12/2016 ratio normal, FVC 3.12 L 66% predicted, total lung capacity 5.5 L 77% predicted, DLCO 22.05 66% predicted  Labs:  Path:  Echo: 12/2016 TTE> LVEF 60-65%, moderate LVH, moderate LAE, RSVP 11mmHg, mild RV dysfunction June 2019 transthoracic echocardiogram from [facility] showed severe LVH, normal RV size and function, no comment on right ventricular pressure  Heart Catheterization:  Records from a hospitalization in 05/2017 reviewed where he was treated for AKI with IV fluids.  ID notes from 04/2017 reviewed where he had a fungal infection of his finger.      Assessment & Plan:    Pulmonary hypertension ([place]) - Plan: Pulmonary function test  OSA (obstructive sleep apnea)  Tobacco use disorder in remission  ESRD (end stage renal disease) (HCC)  LVH (left ventricular hypertrophy)  Discussion: This is a pleasant 67 year old male who used to smoke heavily who comes to my clinic today for evaluation of pulmonary hypertension.  Interestingly, the most recent echocardiogram did not show evidence of pulmonary hypertension though this was suggested on a December 2018 echocardiogram.  I explained to him today the pulmonary hypertension is always an abnormal finding and can commonly be seen in patients with left heart disease and obstructive sleep apnea.  In his particular case I think the likelihood of him having [place] group 2 disease is greater than 95% based on his severe concentric LVH and left atrial hypertrophy.  Further, his obstructive sleep apnea contributes to this.  Whether or not there is some concomitant underlying lung disease is uncertain though I doubt it.  We will check a lung function test again to compare to the study from December 2018 to see if there has been any interval change.  The CT scan from [facility] did not show evidence of underlying interstitial lung disease so I doubt another underlying pulmonary parenchymal abnormality.  Patient to have [place] group 2 disease do not benefit from pulmonary vasodilators so we would not consider using that.  Plan: Pulmonary hypertension: This is the medical term that means that the blood pressure in your lungs is higher than it should be In your particular case I think it is due to the fact that your left heart is thick and has a difficult time relaxing thereby increasing the pressure in your lungs I think the obstructive sleep apnea also contributes to this We will check a lung function test to make sure there is no evidence of a new lung problem We will check your  oxygen level while walking today in clinic   Left ventricular hypertrophy: This term means that the left side of your heart is too thick and has a difficult time relaxing. I would be very interested to see the repeat echocardiogram as arranged by your cardiologist  Obstructive sleep apnea: Keep using CPAP nightly Follow-up with Dr. Annamaria Boots as previously arranged in October of this year  We will see you back in about 8 weeks to go over the results of the echocardiogram that will be performed by your cardiologist.    Current Outpatient Medications:  .  albuterol (PROVENTIL HFA;VENTOLIN HFA) 108 (90 Base) MCG/ACT inhaler, Inhale 2 puffs into the lungs every 6 (six) hours as needed for wheezing or shortness of breath., Disp: , Rfl:  .  aspirin EC 81 MG tablet, Take 81 mg by mouth daily. , Disp: , Rfl:  .  atorvastatin (LIPITOR) 40 MG tablet, Take 40 mg by mouth daily. , Disp: , Rfl:  .  calcitRIOL (ROCALTROL) 0.25 MCG capsule, Take 0.25 mcg by mouth See admin instructions. Take 1 capsule (0.25 mcg)  by mouth five times weekly - Monday thru Friday, Disp: , Rfl:  .  gabapentin (NEURONTIN) 300 MG capsule, Take 300 mg by mouth 2 (two) times daily., Disp: , Rfl:  .  HYDROcodone-acetaminophen (NORCO) 10-325 MG tablet, Take 1-2 tablets by mouth every 6 (six) hours as needed (pain). , Disp: , Rfl:  .  insulin glargine (LANTUS) 100 unit/mL SOPN, Inject 5 Units into the skin at bedtime. , Disp: , Rfl:  .  insulin lispro (HUMALOG KWIKPEN) 100 UNIT/ML KiwkPen, Inject 5-15 Units into the skin See admin instructions. Inject 5 units subcutaneously three times daily before meals adjusted per carb count - add 2 units for every 25 points, Disp: , Rfl:  .  methimazole (TAPAZOLE) 5 MG tablet, Take 5 mg by mouth daily., Disp: , Rfl: 3 .  Multiple Vitamin (MULTIVITAMIN WITH MINERALS) TABS tablet, Take 1 tablet by mouth daily., Disp: , Rfl:  .  mycophenolate (MYFORTIC) 360 MG TBEC EC tablet, Take 360 mg by mouth 2 (two)  times daily., Disp: , Rfl:  .  omeprazole (PRILOSEC) 20 MG capsule, Take 20 mg by mouth daily., Disp: , Rfl:  .  predniSONE (DELTASONE) 5 MG tablet, Take 5 mg by mouth daily with breakfast., Disp: , Rfl:  .  sulfamethoxazole-trimethoprim (BACTRIM,SEPTRA) 400-80 MG tablet, Take 1 tablet by mouth every Monday, Wednesday, and Friday. , Disp: , Rfl:  .  tacrolimus (PROGRAF) 1 MG capsule, Take 2 mg by mouth 2 (two) times daily., Disp: , Rfl: 5 .  tamsulosin (FLOMAX) 0.4 MG CAPS capsule, Take 0.4 mg by mouth daily after breakfast., Disp: , Rfl:  .  vitamin B-12 (CYANOCOBALAMIN) 1000 MCG tablet, Take 1,000 mcg by mouth 2 (two) times daily. , Disp: , Rfl:

## 2017-09-21 ENCOUNTER — Ambulatory Visit: Payer: Medicare Other | Admitting: Gastroenterology

## 2017-09-21 ENCOUNTER — Encounter

## 2017-09-25 ENCOUNTER — Encounter: Payer: Self-pay | Admitting: Gastroenterology

## 2017-09-26 DIAGNOSIS — I10 Essential (primary) hypertension: Secondary | ICD-10-CM | POA: Diagnosis not present

## 2017-09-26 DIAGNOSIS — M961 Postlaminectomy syndrome, not elsewhere classified: Secondary | ICD-10-CM | POA: Diagnosis not present

## 2017-09-26 DIAGNOSIS — M47812 Spondylosis without myelopathy or radiculopathy, cervical region: Secondary | ICD-10-CM | POA: Diagnosis not present

## 2017-09-27 ENCOUNTER — Other Ambulatory Visit: Payer: Self-pay

## 2017-09-27 ENCOUNTER — Ambulatory Visit (INDEPENDENT_AMBULATORY_CARE_PROVIDER_SITE_OTHER): Payer: Medicare Other

## 2017-09-27 DIAGNOSIS — I272 Pulmonary hypertension, unspecified: Secondary | ICD-10-CM

## 2017-09-27 NOTE — Progress Notes (Signed)
Complete echocardiogram has been performed.  Jimmy Bona Hubbard RDCS, RVT 

## 2017-10-02 ENCOUNTER — Telehealth: Payer: Self-pay | Admitting: Cardiology

## 2017-10-02 ENCOUNTER — Telehealth: Payer: Self-pay | Admitting: Emergency Medicine

## 2017-10-02 NOTE — Telephone Encounter (Signed)
Returned call

## 2017-10-02 NOTE — Telephone Encounter (Signed)
Left message for patient to return call regarding results  

## 2017-10-02 NOTE — Telephone Encounter (Signed)
Patient informed of echocardiogram results and confirmed next office visit.

## 2017-10-05 DIAGNOSIS — I509 Heart failure, unspecified: Secondary | ICD-10-CM | POA: Diagnosis not present

## 2017-10-06 DIAGNOSIS — Z792 Long term (current) use of antibiotics: Secondary | ICD-10-CM | POA: Diagnosis not present

## 2017-10-06 DIAGNOSIS — Z4822 Encounter for aftercare following kidney transplant: Secondary | ICD-10-CM | POA: Diagnosis not present

## 2017-10-06 DIAGNOSIS — I10 Essential (primary) hypertension: Secondary | ICD-10-CM | POA: Diagnosis not present

## 2017-10-06 DIAGNOSIS — D899 Disorder involving the immune mechanism, unspecified: Secondary | ICD-10-CM | POA: Diagnosis not present

## 2017-10-06 DIAGNOSIS — B349 Viral infection, unspecified: Secondary | ICD-10-CM | POA: Diagnosis not present

## 2017-10-06 DIAGNOSIS — Z940 Kidney transplant status: Secondary | ICD-10-CM | POA: Diagnosis not present

## 2017-10-06 DIAGNOSIS — Z7952 Long term (current) use of systemic steroids: Secondary | ICD-10-CM | POA: Diagnosis not present

## 2017-10-06 DIAGNOSIS — G4733 Obstructive sleep apnea (adult) (pediatric): Secondary | ICD-10-CM | POA: Diagnosis not present

## 2017-10-06 DIAGNOSIS — Z79899 Other long term (current) drug therapy: Secondary | ICD-10-CM | POA: Diagnosis not present

## 2017-10-06 DIAGNOSIS — Z794 Long term (current) use of insulin: Secondary | ICD-10-CM | POA: Diagnosis not present

## 2017-10-06 DIAGNOSIS — E119 Type 2 diabetes mellitus without complications: Secondary | ICD-10-CM | POA: Diagnosis not present

## 2017-10-06 DIAGNOSIS — E8770 Fluid overload, unspecified: Secondary | ICD-10-CM | POA: Diagnosis not present

## 2017-10-09 ENCOUNTER — Encounter: Payer: Self-pay | Admitting: Gastroenterology

## 2017-10-09 ENCOUNTER — Ambulatory Visit (AMBULATORY_SURGERY_CENTER): Payer: Medicare Other | Admitting: Gastroenterology

## 2017-10-09 ENCOUNTER — Telehealth: Payer: Self-pay | Admitting: Emergency Medicine

## 2017-10-09 VITALS — BP 121/68 | HR 74 | Temp 98.7°F | Resp 19 | Ht 71.0 in | Wt 216.0 lb

## 2017-10-09 DIAGNOSIS — R1310 Dysphagia, unspecified: Secondary | ICD-10-CM | POA: Diagnosis not present

## 2017-10-09 DIAGNOSIS — K2960 Other gastritis without bleeding: Secondary | ICD-10-CM | POA: Diagnosis not present

## 2017-10-09 DIAGNOSIS — K317 Polyp of stomach and duodenum: Secondary | ICD-10-CM | POA: Diagnosis not present

## 2017-10-09 DIAGNOSIS — K259 Gastric ulcer, unspecified as acute or chronic, without hemorrhage or perforation: Secondary | ICD-10-CM | POA: Diagnosis not present

## 2017-10-09 MED ORDER — SODIUM CHLORIDE 0.9 % IV SOLN: 500.0000 mL | Freq: Once | INTRAVENOUS | Status: DC

## 2017-10-09 MED ORDER — PANTOPRAZOLE SODIUM 40 MG PO TBEC: 40.0000 mg | DELAYED_RELEASE_TABLET | Freq: Every day | ORAL | 3 refills | Status: DC

## 2017-10-09 NOTE — Progress Notes (Signed)
Phone call to Dr. Wendy Poet office [phone]) to ask for cardiac clearance for EDG today due newly diagnosed aneurysm. Spoke with Dr. Agustin Cree personally, who states that is okay to proceed with procedure as scheduled.

## 2017-10-09 NOTE — Progress Notes (Signed)
Pt's states no medical or surgical changes since previsit or office visit. 

## 2017-10-09 NOTE — Progress Notes (Signed)
Fistula to left upper forarm. Patient stated Cardiologist called patient last Monday and was told told he had a aneurysm. Follow up appointment on October 14. Elizabeth Palau CRNA aware will discuss with doctor. Will inform if preceding with procedure.

## 2017-10-09 NOTE — Progress Notes (Signed)
Report given to PACU, vss 

## 2017-10-09 NOTE — Progress Notes (Signed)
Called to room to assist during endoscopic procedure.  Patient ID and intended procedure confirmed with present staff. Received instructions for my participation in the procedure from the performing physician.  

## 2017-10-09 NOTE — Telephone Encounter (Signed)
[facility] calling to verify clearance for patient after recent finding of abdominal aortic aneurysm. Dr. Agustin Cree spoke with them and ensured that he is still able to go through with procedure.

## 2017-10-09 NOTE — Op Note (Signed)
[name] Patient Name: Jon [name] Procedure Date: 10/09/2017 8:34 AM MRN: [id number] Endoscopist: Jackquline Denmark , MD Age: 67 Referring MD:  Date of Birth: [birth year] Gender: Male Account #: [id number] Procedure:                Upper GI endoscopy Indications:              Dysphagia Medicines:                Monitored Anesthesia Care Procedure:                Pre-Anesthesia Assessment:                           - Prior to the procedure, a History and Physical                            was performed, and patient medications and                            allergies were reviewed. The patient's tolerance of                            previous anesthesia was also reviewed. The risks                            and benefits of the procedure and the sedation                            options and risks were discussed with the patient.                            All questions were answered, and informed consent                            was obtained. Prior Anticoagulants: The patient has                            taken aspirin, last dose was 1 day prior to                            procedure. ASA Grade Assessment: III - A patient                            with severe systemic disease. After reviewing the                            risks and benefits, the patient was deemed in                            satisfactory condition to undergo the procedure.                           After obtaining informed consent, the endoscope was  passed under direct vision. Throughout the                            procedure, the patient's blood pressure, pulse, and                            oxygen saturations were monitored continuously. The                            Model GIF-HQ190 [id number]) scope was introduced                            through the mouth, and advanced to the second part                            of duodenum. The upper GI endoscopy was                        accomplished without difficulty. The patient                            tolerated the procedure well. Scope In: Scope Out: Findings:                 No endoscopic abnormality was evident in the                            esophagus except esophagus being mildly tortuous,                            to explain the patient's complaint of dysphagia.                            Z-line well-defined at 40 cm. It was decided,                            however, to proceed with dilation of the entire                            esophagus. The scope was withdrawn. Dilation was                            performed with a Maloney dilator with no resistance                            at 50 Fr. Biopsies were obtained from the proximal                            and distal esophagus with cold forceps for                            histology of suspected eosinophilic esophagitis.                           35mm gastric polyp  in the body of the stomach. This                            was removed with a cold biopsy forceps.                           A few localized, 4 to 6 mm non-bleeding erosions                            were found in the gastric antrum. There were no                            stigmata of recent bleeding. Biopsies were taken                            with a cold forceps for histology. Estimated blood                            loss was minimal.                           The exam was otherwise without abnormality. Complications:            No immediate complications. Estimated Blood Loss:     Estimated blood loss: none. Impression:               -Erosive gastritis (biopsed)                           -Mildly tortuous esophagus status post esophageal                            dilatation. Recommendation:           - Patient has a contact number available for                            emergencies. The signs and symptoms of potential                            delayed  complications were discussed with the                            patient. Return to normal activities tomorrow.                            Written discharge instructions were provided to the                            patient.                           - Post dilatation diet.                           - Change omeprazole to Protonix 40 mg p.o. once a  day.                           - Await pathology results. Already was instructed                            to stain biopsies for for HSV and CMV.                           - Return to my office in 12 weeks.                           - Avoid nonsteroidals. Jackquline Denmark, MD 10/09/2017 8:54:03 AM This report has been signed electronically.

## 2017-10-09 NOTE — Patient Instructions (Signed)
AVOID NSAIDS (ASPIRIN, IBUPROFEN, ALEVE) YOU CAN TAKE TYLENOL IF NEEDED  FOLLOW THE POST DILATION DIET TODAY  YOU HAD AN ENDOSCOPIC PROCEDURE TODAY AT [facility]:   Refer to the procedure report that was given to you for any specific questions about what was found during the examination.  If the procedure report does not answer your questions, please call your gastroenterologist to clarify.  If you requested that your care partner not be given the details of your procedure findings, then the procedure report has been included in a sealed envelope for you to review at your convenience later.  YOU SHOULD EXPECT: Some feelings of bloating in the abdomen. Passage of more gas than usual.  Walking can help get rid of the air that was put into your GI tract during the procedure and reduce the bloating. If you had a lower endoscopy (such as a colonoscopy or flexible sigmoidoscopy) you may notice spotting of blood in your stool or on the toilet paper. If you underwent a bowel prep for your procedure, you may not have a normal bowel movement for a few days.  Please Note:  You might notice some irritation and congestion in your nose or some drainage.  This is from the oxygen used during your procedure.  There is no need for concern and it should clear up in a day or so.  SYMPTOMS TO REPORT IMMEDIATELY:   Following upper endoscopy (EGD)  Vomiting of blood or coffee ground material  New chest pain or pain under the shoulder blades  Painful or persistently difficult swallowing  New shortness of breath  Fever of 100F or higher  Black, tarry-looking stools  For urgent or emergent issues, a gastroenterologist can be reached at any hour by calling [phone].   DIET:  We do recommend a small meal at first, but then you may proceed to your regular diet.  Drink plenty of fluids but you should avoid alcoholic beverages for 24 hours.  ACTIVITY:  You should plan to take it easy for the rest  of today and you should NOT DRIVE or use heavy machinery until tomorrow (because of the sedation medicines used during the test).    FOLLOW UP: Our staff will call the number listed on your records the next business day following your procedure to check on you and address any questions or concerns that you may have regarding the information given to you following your procedure. If we do not reach you, we will leave a message.  However, if you are feeling well and you are not experiencing any problems, there is no need to return our call.  We will assume that you have returned to your regular daily activities without incident.  If any biopsies were taken you will be contacted by phone or by letter within the next 1-3 weeks.  Please call us at [phone] if you have not heard about the biopsies in 3 weeks.    SIGNATURES/CONFIDENTIALITY: You and/or your care partner have signed paperwork which will be entered into your electronic medical record.  These signatures attest to the fact that that the information above on your After Visit Summary has been reviewed and is understood.  Full responsibility of the confidentiality of this discharge information lies with you and/or your care-partner.

## 2017-10-10 ENCOUNTER — Telehealth: Payer: Self-pay | Admitting: *Deleted

## 2017-10-10 ENCOUNTER — Telehealth: Payer: Self-pay

## 2017-10-10 NOTE — Telephone Encounter (Signed)
First post procedure follow up call, no answer 

## 2017-10-10 NOTE — Telephone Encounter (Signed)
  Follow up Call-  Call back number 10/09/2017  Post procedure Call Back phone  # [phone]  Permission to leave phone message Yes  Some recent data might be hidden     Patient questions:  Do you have a fever, pain , or abdominal swelling? No. Pain Score  0 *  Have you tolerated food without any problems? Yes.    Have you been able to return to your normal activities? Yes.    Do you have any questions about your discharge instructions: Diet   No. Medications  No. Follow up visit  No.  Do you have questions or concerns about your Care? No.  Actions: * If pain score is 4 or above: No action needed, pain <4.

## 2017-10-17 DIAGNOSIS — Z940 Kidney transplant status: Secondary | ICD-10-CM | POA: Diagnosis not present

## 2017-10-17 DIAGNOSIS — N2581 Secondary hyperparathyroidism of renal origin: Secondary | ICD-10-CM | POA: Diagnosis not present

## 2017-10-17 DIAGNOSIS — I129 Hypertensive chronic kidney disease with stage 1 through stage 4 chronic kidney disease, or unspecified chronic kidney disease: Secondary | ICD-10-CM | POA: Diagnosis not present

## 2017-10-18 ENCOUNTER — Encounter: Payer: Self-pay | Admitting: Gastroenterology

## 2017-10-23 ENCOUNTER — Ambulatory Visit: Payer: Medicare Other | Admitting: Cardiology

## 2017-10-23 ENCOUNTER — Encounter: Payer: Self-pay | Admitting: Cardiology

## 2017-10-23 VITALS — BP 140/68 | HR 87 | Ht 70.75 in | Wt 220.0 lb

## 2017-10-23 DIAGNOSIS — I7121 Aneurysm of the ascending aorta, without rupture: Secondary | ICD-10-CM

## 2017-10-23 DIAGNOSIS — I712 Thoracic aortic aneurysm, without rupture: Secondary | ICD-10-CM

## 2017-10-23 DIAGNOSIS — M7989 Other specified soft tissue disorders: Secondary | ICD-10-CM | POA: Diagnosis not present

## 2017-10-23 DIAGNOSIS — I272 Pulmonary hypertension, unspecified: Secondary | ICD-10-CM | POA: Diagnosis not present

## 2017-10-23 DIAGNOSIS — I10 Essential (primary) hypertension: Secondary | ICD-10-CM | POA: Diagnosis not present

## 2017-10-23 DIAGNOSIS — Z940 Kidney transplant status: Secondary | ICD-10-CM

## 2017-10-23 DIAGNOSIS — R0609 Other forms of dyspnea: Secondary | ICD-10-CM | POA: Diagnosis not present

## 2017-10-23 HISTORY — DX: Aneurysm of the ascending aorta, without rupture: I71.21

## 2017-10-23 HISTORY — DX: Thoracic aortic aneurysm, without rupture: I71.2

## 2017-10-23 NOTE — [place] Notes (Signed)
Cardiology Office Note:    Date:  10/23/2017   ID:  Jon [name], DOB Aug 27, 1950, MRN [id number]  PCP:  [name], MD  Cardiologist:  [name], MD    Referring MD: [name], MD   Chief Complaint  Patient presents with  . Follow up Echo  I am doing well  History of Present Illness:    Jon [name] is a 67 y.o. male with kidney transplant, hypertension, diabetes.  He was referred to [place] because of pulmonary hypertension.  Apparently echocardiogram done at the end of last year show elevation of the pressure within pulmonary circulations.  He does have some exertional shortness of breath but there is nothing changed within last 2 years or so.  He did have swelling of lower extremities but does not have it anymore.  He does more or less what he wants to do.  I will review his echocardiogram very carefully from 2010 I see pulmonary pressure 42 mmHg which is barely elevated.  We did repeat his echocardiogram there is no reporting of pulmonary hypertension on this echocardiogram I do patient today to the echo lab and the repeated echocardiogram myself and we barely can see any TR there is no PI right side is normal in size and normal in function.  We cannot detect any significant velocity increase in tricuspid regurgitant jet.  The highest pressure that I see is about 30 mmHg which is perfectly within normal limits.  Therefore I think we can step back for now in pursuing pulmonary hypertension however I would strongly recommend to repeat echocardiogram in about 6 months.  I also told him about signs and symptoms of pulmonary hypertension.  In the meantime he is being managed excellently by pulmonary team for his sleep apnea which should help as well if he truly had mild elevation of pulmonary pressure before because of his sleep apnea and that being excellently managed and should improve.  Past Medical History:  Diagnosis Date  . Abnormal gait   . Anemia of chronic  disease   . Arthritis    back, hands   . Chronic back pain    radiculopathy and stenosis  . Chronic ischemic heart disease    Severe LVH  . Chronic kidney disease    [name], awaiting Transplant   . Congestive heart failure (CHF) ([place])   . CVA (cerebral infarction) 03/2013  . Diabetes mellitus     Lantus nightly ;type 2  . GERD (gastroesophageal reflux disease)   . H/O hiatal hernia   . History of colon polyps   . Hx of cardiovascular stress test    a.  Lexiscan Myoview (05/2013):  No ischemia, EF 53%; Normal Study  . Hyperlipidemia    takes Lovastatin daily  . Hypertension    takes Amlodipine and Catapres daily    dr Forrestine Him, Loop Recorder - [facility]  . Nocturia   . Obesity   . Peripheral edema    takes Lasix daily  . Sleep apnea    cpap , study in their home, 09/2102- [facility]           . Stroke (HCC)    speech, rt arm weakness  . Urinary frequency     Past Surgical History:  Procedure Laterality Date  . AV FISTULA PLACEMENT Left 04/03/2013   Procedure: ARTERIOVENOUS (AV) FISTULA CREATION- LEFT RADIOCEPHALIC VS BRACHIOCEPHALIC;  Surgeon: Conrad Sidon, MD;  Location: [place];  Service: Vascular;  Laterality: Left;  .  AV FISTULA PLACEMENT Left 05/14/2013   Procedure: LEFT BRACHIOCEPHALIC ARTERIOVENOUS (AV) FISTULA CREATION;  Surgeon: Conrad Franklin, MD;  Location: [place] [facility];  Service: Vascular;  Laterality: Left;  . BACK SURGERY  12/2012   2003- 1st back surgery & then 2014- fusion  . COLONOSCOPY    . ESOPHAGOGASTRODUODENOSCOPY    . HEMORRHOID SURGERY    . KIDNEY TRANSPLANT  10/01/2013  . left knee surgery    . LOOP RECORDER IMPLANT  03/19/13   MDT LinQ implanted by Dr Rayann Heman for cryptogenic stroke  . LOOP RECORDER IMPLANT N/A 03/19/2013   Procedure: LOOP RECORDER IMPLANT;  Surgeon: Coralyn Mark, MD;  Location: [place] CATH LAB;  Service: Cardiovascular;  Laterality: N/A;  . NEPHRECTOMY TRANSPLANTED ORGAN    . NEUROPLASTY / TRANSPOSITION MEDIAN NERVE AT CARPAL TUNNEL BILATERAL     . right leg surgery     pin in place  . right wrist surgery    . TEE WITHOUT CARDIOVERSION N/A 03/19/2013   Procedure: TRANSESOPHAGEAL ECHOCARDIOGRAM (TEE);  Surgeon: [name], MD;  [facility] END[facility]COPY;  Service: Cardiovascular;  Laterality: N/A;  . THYROID SURGERY  10/13/2015   Performed at [facility] with surgical pathology revealed consistent with benign follicular nodule ([name] category II)    Current Medications: Current Meds  Medication Sig  . albuterol (PROVENTIL HFA;VENTOLIN HFA) 108 (90 Base) MCG/ACT inhaler Inhale 2 puffs into the lungs every 6 (six) hours as needed for wheezing or shortness of breath.  [name] aspirin EC 81 MG tablet Take 81 mg by mouth daily.   [name] atorvastatin (LIPITOR) 40 MG tablet Take 40 mg by mouth daily.   . calcitRIOL (ROCALTROL) 0.25 MCG capsule Take 0.25 mcg by mouth See admin instructions. Take 1 capsule (0.25 mcg) by mouth five times weekly - Monday thru Friday  . gabapentin (NEURONTIN) 300 MG capsule Take 300 mg by mouth 2 (two) times daily.  [name] HYDROcodone-acetaminophen (NORCO) 10-325 MG tablet Take 1-2 tablets by mouth every 6 (six) hours as needed (pain).   . insulin glargine (LANTUS) 100 unit/mL SOPN Inject 24 Units into the skin at bedtime.   . insulin lispro (HUMALOG KWIKPEN) 100 UNIT/ML KiwkPen Inject 5-15 Units into the skin See admin instructions. Inject 5 units subcutaneously three times daily before meals adjusted per carb count - add 2 units for every 25 points  . methimazole (TAPAZOLE) 5 MG tablet Take 5 mg by mouth daily.  . Multiple Vitamin (MULTIVITAMIN WITH MINERALS) TABS tablet Take 1 tablet by mouth daily.  . mycophenolate (MYFORTIC) 360 MG TBEC EC tablet Take 360 mg by mouth 2 (two) times daily.  [name] omeprazole (PRILOSEC) 20 MG capsule Take 20 mg by mouth daily.  . pantoprazole (PROTONIX) 40 MG tablet Take 1 tablet (40 mg total) by mouth daily.  . predniSONE (DELTASONE) 5 MG tablet Take 5 mg by mouth daily with breakfast.  .  sulfamethoxazole-trimethoprim (BACTRIM,SEPTRA) 400-80 MG tablet Take 1 tablet by mouth every Monday, Wednesday, and Friday.   . tacrolimus (PROGRAF) 1 MG capsule Take 2 mg by mouth 2 (two) times daily.  . tamsulosin (FLOMAX) 0.4 MG CAPS capsule Take 0.4 mg by mouth daily after breakfast.  . vitamin B-12 (CYANOCOBALAMIN) 1000 MCG tablet Take 1,000 mcg by mouth 2 (two) times daily.      Allergies:   Lisinopril; Meloxicam; and Victoza [liraglutide]   Social History   Socioeconomic History  . Marital status: Married    Spouse name: agnes  . Number of children: 5  .  Years of education: 8th  . Highest education level: Not on file  Occupational History  . Occupation: disabled  Social Needs  . Financial resource strain: Not on file  . Food insecurity:    Worry: Not on file    Inability: Not on file  . Transportation needs:    Medical: Not on file    Non-medical: Not on file  Tobacco Use  . Smoking status: Former Smoker    Packs/day: 0.50    Years: 40.00    Pack years: 20.00    Types: Cigarettes    Last attempt to quit: 12/27/2012    Years since quitting: 4.8  . Smokeless tobacco: Never Used  Substance and Sexual Activity  . Alcohol use: No    Alcohol/week: 0.0 standard drinks  . Drug use: No  . Sexual activity: Yes  Lifestyle  . Physical activity:    Days per week: Not on file    Minutes per session: Not on file  . Stress: Not on file  Relationships  . Social connections:    Talks on phone: Not on file    Gets together: Not on file    Attends religious service: Not on file    Active member of club or organization: Not on file    Attends meetings of clubs or organizations: Not on file    Relationship status: Not on file  Other Topics Concern  . Not on file  Social History Narrative   Patient lives at home with his wife Herbert Pun).  One story home with basement.   Patient is disabled.   Education 8th grade.   Right handed.   Caffeine One cup of coffee daily.   Retired  Administrator.   5 children.     Family History: The patient's family history includes Diabetes in his brother; Heart disease in his father; Hyperlipidemia in his father and son; Hypertension in his father and mother; Renal Disease in his father. ROS:   Please see the history of present illness.    All 14 point review of systems negative except as described per history of present illness  EKGs/Labs/Other Studies Reviewed:      Recent Labs: 06/18/2017: B Natriuretic Peptide 131.8 07/26/2017: ALT 39; BUN 26; Creatinine, Ser 1.60; Hemoglobin 12.7; Magnesium 1.9; Platelets 132; Potassium 4.5; Sodium 141; TSH 0.013  Recent Lipid Panel    Component Value Date/Time   CHOL 137 01/02/2017 0343   TRIG 81 01/02/2017 0343   HDL 38 (L) 01/02/2017 0343   CHOLHDL 3.6 01/02/2017 0343   VLDL 16 01/02/2017 0343   LDLCALC 83 01/02/2017 0343    Physical Exam:    VS:  BP 140/68   Pulse 87   Ht 5' 10.75" (1.797 m)   Wt 220 lb (99.8 kg)   SpO2 97%   BMI 30.90 kg/m     Wt Readings from Last 3 Encounters:  10/23/17 220 lb (99.8 kg)  10/09/17 216 lb (98 kg)  09/13/17 214 lb 3.2 oz (97.2 kg)     GEN:  Well nourished, well developed in no acute distress HEENT: Normal NECK: No JVD; No carotid bruits LYMPHATICS: No lymphadenopathy CARDIAC: RRR, no murmurs, no rubs, no gallops RESPIRATORY:  Clear to auscultation without rales, wheezing or rhonchi  ABDOMEN: Soft, non-tender, non-distended MUSCULOSKELETAL:  No edema; No deformity  SKIN: Warm and dry LOWER EXTREMITIES: no swelling NEUROLOGIC:  Alert and oriented x 3 PSYCHIATRIC:  Normal affect   ASSESSMENT:    1. Pulmonary hypertension (  [place])   2. Essential hypertension   3. Swelling of lower extremity   4. History of kidney transplant   5. Dyspnea on exertion    PLAN:    In order of problems listed above:  1. Pulmonary hypertension: Latest echocardiogram did not confirm presence of elevation pulmonary artery pressure.  There is trace  TR no PI right side is normal in size and function.  The highest velocity was measured from tricuspid regurgitation was indicating pulmonary pressure of 30 mmHg.  He is being managed for obstructive sleep apnea by pulmonary and I will recommend to continue.  We will bring him back in 6 months to repeat echocardiogram 2. Essential hypertension blood pressure fairly controlled with all medication which I will continue. 3. History of kidney transplant followed by kidney transplant team. 4. Dyspnea on exertion stable. 5. Swelling of lower extremities known. Mild ascending aortic aneurysm measuring only 3.9 cm.  I asked her to avoid isovolumetric exercises he will need to have a follow-up in the future on it. He was warned about signs and symptoms of pulmonary hypertension he will let us know if he develops any of it.   Medication Adjustments/Labs and Tests Ordered: Current medicines are reviewed at length with the patient today.  Concerns regarding medicines are outlined above.  No orders of the defined types were placed in this encounter.  Medication changes: No orders of the defined types were placed in this encounter.   Signed, Park Liter, MD, [facility] 10/23/2017 2:41 PM    [place]

## 2017-10-23 NOTE — Patient Instructions (Signed)
Medication Instructions:  Your physician recommends that you continue on your current medications as directed. Please refer to the Current Medication list given to you today.  If you need a refill on your cardiac medications before your next appointment, please call your pharmacy.   Lab work: None. If you have labs (blood work) drawn today and your tests are completely normal, you will receive your results only by: [name] MyChart Message (if you have MyChart) OR . A paper copy in the mail If you have any lab test that is abnormal or we need to change your treatment, we will call you to review the results.  Testing/Procedures: None.  Follow-Up: At [facility], you and your health needs are our priority.  As part of our continuing mission to provide you with exceptional heart care, we have created designated Provider Care Teams.  These Care Teams include your primary Cardiologist (physician) and Advanced Practice Providers (APPs -  Physician Assistants and Nurse Practitioners) who all work together to provide you with the care you need, when you need it. You will need a follow up appointment in 5 months.  Please call our office 2 months in advance to schedule this appointment.  You may see [name], MD or another member of our [place] Provider Team in [place]: Shirlee More, MD . Jyl Heinz, MD  Any Other Special Instructions Will Be Listed Below (If Applicable).

## 2017-10-27 DIAGNOSIS — Z4822 Encounter for aftercare following kidney transplant: Secondary | ICD-10-CM | POA: Diagnosis not present

## 2017-10-30 ENCOUNTER — Encounter: Payer: Self-pay | Admitting: Internal Medicine

## 2017-10-30 ENCOUNTER — Ambulatory Visit: Payer: Medicare Other | Admitting: Internal Medicine

## 2017-10-30 ENCOUNTER — Telehealth: Payer: Self-pay | Admitting: Internal Medicine

## 2017-10-30 VITALS — BP 120/74 | HR 85 | Ht 70.5 in | Wt 221.0 lb

## 2017-10-30 DIAGNOSIS — I272 Pulmonary hypertension, unspecified: Secondary | ICD-10-CM | POA: Diagnosis not present

## 2017-10-30 DIAGNOSIS — G4733 Obstructive sleep apnea (adult) (pediatric): Secondary | ICD-10-CM | POA: Diagnosis not present

## 2017-10-30 NOTE — Assessment & Plan Note (Signed)
Good compliance and control reported by patient and his wife.  Machine is more than 66 years old and he would like to change to a local DME-[facility]. Plan-replace old CPAP machine, auto 5-20.  Change DME to [facility] per patient request.

## 2017-10-30 NOTE — [place] Notes (Signed)
HPI  male former smoker followed for OSA, complicated by ESRD/Transplant, DM 2, GERD, CVA, chronic back pain Unattended Home Sleep Test 10/24/12-AHI 24/hour, desaturation to 81%. PFT 12/28/16-mild restriction, mild reduction of diffusion.  No response to bronchodilator.  FVC 3.28/70%, FEV1 2.64/75%, ratio 0.81, TLC 77%, DLCO 66%. -------------------------------------------------------------------------------  12/28/16-67 year old male former smoker followed for OSA, DOE complicated by ESRD/transplant, DM 2, GERD, CVA, chronic back pain CPAP [facility]> wants change to [facility] next year with replacement machine at that time. OSA; DME: [employer] but needs to change to [facility] for supplies only and then order new machine 10/2017.  Download not available.  Planning change to [facility] when he is due for replacement machine next year.  Meanwhile he and wife describe good compliance and control.  Rare snoring and no daytime sleepiness. PFT done for DOE and discussed today.  He may have borderline glaucoma.  We can try Anoro and he will get eye check in January. PFT 12/28/16-mild restriction, mild reduction of diffusion.  No response to bronchodilator.  FVC 3.28/70%, FEV1 2.64/75%, ratio 0.81, TLC 77%, DLCO 66%.  10/30/2017- 67 year old male former smoker followed for OSA, DOE complicated by ESRD/transplant, DM 2, GERD, CVA, chronic back pain, Pulmonary Hypertension ([place]) -----OSA: DME [facility]. Pt wears CPAP nightly. Pressure works well for patient. No new supplies.  Albuterol HFA, prednisone 5 mg daily CPAP autopap/[employer]ero [facility]   We are trying to contact them for download, local office has closed. Wife confirms that he wears it all night every night and it prevents snoring.  He says he sleeps well with his CPAP.  Machine is more than 66 years old.  ROS-see HPI   + = positive Constitutional:    weight loss, night sweats, fevers, chills, fatigue, lassitude. HEENT:    headaches, difficulty swallowing,  tooth/dental problems, sore throat,       sneezing, itching, ear ache, nasal congestion, post nasal drip, snoring CV:    chest pain, orthopnea, PND, swelling in lower extremities, anasarca,                                                     dizziness, palpitations Resp:   +shortness of breath with exertion or at rest.                productive cough,   non-productive cough, coughing up of blood.              change in color of mucus.  wheezing.   Skin:    rash or lesions. GI:  No-   heartburn, indigestion, abdominal pain, nausea, vomiting, diarrhea,                 change in bowel habits, loss of appetite GU: dysuria, change in color of urine, no urgency or frequency.   flank pain. MS:   joint pain, stiffness, decreased range of motion, back pain. Neuro-     nothing unusual Psych:  change in mood or affect.  depression or anxiety.   memory loss.  OBJ- Physical Exam General- Alert, Oriented, Affect-appropriate, Distress- none acute, +overweight Skin- rash-none, lesions- none, excoriation- none Lymphadenopathy- none Head- atraumatic            Eyes- Gross vision intact, PERRLA, conjunctivae and secretions clear            Ears- Hearing, canals-normal  Nose- Clear, no-Septal dev, mucus, polyps, erosion, perforation             Throat- Mallampati IV , mucosa clear , drainage- none, tonsils- atrophic, + full dentures Neck- flexible , trachea midline, no stridor , thyroid nl, carotid no bruit Chest - symmetrical excursion , unlabored           Heart/CV- RRR , murmur+ 1-2/6 S Ao , no gallop  , no rub, nl s1 s2                           - JVD- none , edema- none, stasis changes- none, varices- none           Lung- clear to P&A, wheeze- none, cough- none , dullness-none, rub- none           Chest wall- + loop recorder Abd-  Br/ Gen/ Rectal- Not done, not indicated Extrem- cyanosis- none, clubbing, none, atrophy- none, strength- nl Neuro- grossly intact to observation

## 2017-10-30 NOTE — Patient Instructions (Signed)
Order- please change DME from [facility] to [facility] (patient request), replace old machine    Auto 5-20, mask of choice, humidifier, supplies, AirView   Dx OSA  Please call if we can help

## 2017-10-30 NOTE — Telephone Encounter (Signed)
Per Joellen Jersey, pt has a modem but there is no download or an SD card. Due to pt's machine not being serviced the way it should by [employer], pt has been switched from [facility] to [facility] per pt's request so he can be able to get the help he needs for his cpap necessities.  Called [employer] and spoke with [place] who stated back in December 2018 they sent pt an SD card so they can be able to get info from his cpap and that pt never sent it back. I stated the above information to [facility] and stated to her that pt's DME was changed. [place] expressed understanding. Nothing further needed.

## 2017-10-30 NOTE — Assessment & Plan Note (Signed)
He was assessed by Dr. Lake Bells.  No medications and no oxygen ordered.

## 2017-11-01 ENCOUNTER — Telehealth: Payer: Self-pay

## 2017-11-01 NOTE — Telephone Encounter (Signed)
I would actually encourage them to call Jon [name]'s transplant team at [facility] to get some guidance. Transplant ID is very unique and since the infection I treated him for was on a finger it is possible that this is something different.

## 2017-11-01 NOTE — Telephone Encounter (Signed)
Contact patient to relay Stephanie's message. Patient wife took my call, Mrs. Pezzullo did not have any questions/ concerns while on the phone. Stated she would contact transplant team, and follow- up with them regarding ankle infection, Aundria Rud, [place]

## 2017-11-01 NOTE — Telephone Encounter (Signed)
Patient's wife called today stating her husband's infection is back. Patient has a purple spot on right ankle. Is not complaining of any fevers, pain, night sweats. Patient noticed a scab on his ankle this week, and wife picked it. Stated she attempted to drain the ankle and put some antibiotics last night. [place] would like to schedule an appointment with Janene Madeira, Np for treatment plan. Advised patients wife that she should take husband to the hospital to be evaluated, but would route message to [place] as well Aundria Rud, [name]

## 2017-11-02 DIAGNOSIS — Z794 Long term (current) use of insulin: Secondary | ICD-10-CM | POA: Diagnosis not present

## 2017-11-02 DIAGNOSIS — Z886 Allergy status to analgesic agent status: Secondary | ICD-10-CM | POA: Diagnosis not present

## 2017-11-02 DIAGNOSIS — D899 Disorder involving the immune mechanism, unspecified: Secondary | ICD-10-CM | POA: Diagnosis not present

## 2017-11-02 DIAGNOSIS — Z7952 Long term (current) use of systemic steroids: Secondary | ICD-10-CM | POA: Diagnosis not present

## 2017-11-02 DIAGNOSIS — S91009D Unspecified open wound, unspecified ankle, subsequent encounter: Secondary | ICD-10-CM | POA: Diagnosis not present

## 2017-11-02 DIAGNOSIS — Z79899 Other long term (current) drug therapy: Secondary | ICD-10-CM | POA: Diagnosis not present

## 2017-11-02 DIAGNOSIS — E119 Type 2 diabetes mellitus without complications: Secondary | ICD-10-CM | POA: Diagnosis not present

## 2017-11-02 DIAGNOSIS — K432 Incisional hernia without obstruction or gangrene: Secondary | ICD-10-CM | POA: Diagnosis not present

## 2017-11-02 DIAGNOSIS — Z940 Kidney transplant status: Secondary | ICD-10-CM | POA: Diagnosis not present

## 2017-11-02 DIAGNOSIS — Z4822 Encounter for aftercare following kidney transplant: Secondary | ICD-10-CM | POA: Diagnosis not present

## 2017-11-02 DIAGNOSIS — Z888 Allergy status to other drugs, medicaments and biological substances status: Secondary | ICD-10-CM | POA: Diagnosis not present

## 2017-11-02 DIAGNOSIS — R1011 Right upper quadrant pain: Secondary | ICD-10-CM | POA: Diagnosis not present

## 2017-11-02 NOTE — Telephone Encounter (Signed)
Called Jon [name] at [facility] Transplant ID regarding mutual patient. Janene Madeira, Np would like patient to follow- up with kidney transplant team with his ankle infection. Left voicemail at [phone] for Jon [name] to call office back regarding this patient. [place]

## 2017-11-04 DIAGNOSIS — I509 Heart failure, unspecified: Secondary | ICD-10-CM | POA: Diagnosis not present

## 2017-11-06 NOTE — Telephone Encounter (Signed)
Called Jarrett Soho from transplant ID team regarding patient complaint of ankle infection. Left voicemail asking that transplant team contact patient to follow-up regarding ankle infection. Also left office phone number incase transplant team had any additional questions. [place]

## 2017-11-06 NOTE — Telephone Encounter (Signed)
Called patient to let him know that Janene Madeira, Np would like to have him follow-up with his transplant team regarding his rt ankle infection. Patient's wife took my call and states she would also call Jarrett Soho to schedule an appointment. [place]

## 2017-11-08 DIAGNOSIS — G4733 Obstructive sleep apnea (adult) (pediatric): Secondary | ICD-10-CM | POA: Diagnosis not present

## 2017-11-09 DIAGNOSIS — M71371 Other bursal cyst, right ankle and foot: Secondary | ICD-10-CM | POA: Diagnosis not present

## 2017-11-09 DIAGNOSIS — M71379 Other bursal cyst, unspecified ankle and foot: Secondary | ICD-10-CM | POA: Diagnosis not present

## 2017-11-10 ENCOUNTER — Ambulatory Visit: Payer: Medicare Other | Admitting: Pulmonary Disease

## 2017-11-15 DIAGNOSIS — R109 Unspecified abdominal pain: Secondary | ICD-10-CM | POA: Diagnosis not present

## 2017-11-15 DIAGNOSIS — Z4822 Encounter for aftercare following kidney transplant: Secondary | ICD-10-CM | POA: Diagnosis not present

## 2017-11-15 DIAGNOSIS — Z940 Kidney transplant status: Secondary | ICD-10-CM | POA: Diagnosis not present

## 2017-11-17 DIAGNOSIS — D519 Vitamin B12 deficiency anemia, unspecified: Secondary | ICD-10-CM | POA: Diagnosis not present

## 2017-11-17 DIAGNOSIS — N179 Acute kidney failure, unspecified: Secondary | ICD-10-CM | POA: Diagnosis not present

## 2017-11-17 DIAGNOSIS — Z0000 Encounter for general adult medical examination without abnormal findings: Secondary | ICD-10-CM | POA: Diagnosis not present

## 2017-11-17 DIAGNOSIS — Z79899 Other long term (current) drug therapy: Secondary | ICD-10-CM | POA: Diagnosis not present

## 2017-11-17 DIAGNOSIS — E1121 Type 2 diabetes mellitus with diabetic nephropathy: Secondary | ICD-10-CM | POA: Diagnosis not present

## 2017-11-17 DIAGNOSIS — Z23 Encounter for immunization: Secondary | ICD-10-CM | POA: Diagnosis not present

## 2017-11-20 DIAGNOSIS — D509 Iron deficiency anemia, unspecified: Secondary | ICD-10-CM | POA: Diagnosis not present

## 2017-11-24 ENCOUNTER — Ambulatory Visit: Payer: Medicare Other | Admitting: Pulmonary Disease

## 2017-11-29 ENCOUNTER — Ambulatory Visit: Payer: Medicare Other | Admitting: Pulmonary Disease

## 2017-11-29 ENCOUNTER — Encounter: Payer: Self-pay | Admitting: Pulmonary Disease

## 2017-11-29 ENCOUNTER — Ambulatory Visit (INDEPENDENT_AMBULATORY_CARE_PROVIDER_SITE_OTHER): Payer: Medicare Other | Admitting: Pulmonary Disease

## 2017-11-29 VITALS — BP 142/70 | HR 93 | Ht 71.0 in | Wt 228.2 lb

## 2017-11-29 DIAGNOSIS — I272 Pulmonary hypertension, unspecified: Secondary | ICD-10-CM

## 2017-11-29 DIAGNOSIS — G4733 Obstructive sleep apnea (adult) (pediatric): Secondary | ICD-10-CM | POA: Diagnosis not present

## 2017-11-29 LAB — PULMONARY FUNCTION TEST
DL/VA % PRED: 99 %
DL/VA: 4.63 ml/min/mmHg/L
DLCO UNC % PRED: 82 %
DLCO UNC: 27.1 ml/min/mmHg
FEF 25-75 POST: 3.89 L/s
FEF 25-75 Pre: 3.25 L/sec
FEF2575-%CHANGE-POST: 19 %
FEF2575-%PRED-POST: 142 %
FEF2575-%Pred-Pre: 119 %
FEV1-%CHANGE-POST: 5 %
FEV1-%PRED-PRE: 81 %
FEV1-%Pred-Post: 85 %
FEV1-Post: 2.96 L
FEV1-Pre: 2.81 L
FEV1FVC-%CHANGE-POST: 1 %
FEV1FVC-%PRED-PRE: 110 %
FEV6-%Change-Post: 2 %
FEV6-%Pred-Post: 79 %
FEV6-%Pred-Pre: 77 %
FEV6-Post: 3.52 L
FEV6-Pre: 3.42 L
FEV6FVC-%Pred-Post: 105 %
FEV6FVC-%Pred-Pre: 105 %
FVC-%CHANGE-POST: 3 %
FVC-%PRED-POST: 76 %
FVC-%Pred-Pre: 73 %
FVC-PRE: 3.42 L
FVC-Post: 3.55 L
POST FEV1/FVC RATIO: 83 %
PRE FEV1/FVC RATIO: 82 %
Post FEV6/FVC ratio: 100 %
Pre FEV6/FVC Ratio: 100 %
RV % pred: 82 %
RV: 1.97 L
TLC % pred: 78 %
TLC: 5.62 L

## 2017-11-29 NOTE — Progress Notes (Signed)
Synopsis: OSA, history of renal transplant 2015; found to have pulmonary hypertension on an echo in December 2018; has DISH from his cervical spine  Subjective:   PATIENT ID: Jon [name] GENDER: male DOB: 1950-12-28, MRN: [id number]   HPI  Chief Complaint  Patient presents with  . Follow-up    follows for pulmonary hypertension. breathing good. no new symptoms   Hridhaan says he has been doing well since the last visit.  He has not had any problems with cough, pneumonia, bronchitis, or shortness of breath.  He does not feel that his breathing interferes with his day-to-day activities.  He is here to follow-up a recent echocardiogram and lung function test.  Past Medical History:  Diagnosis Date  . Abnormal gait   . Anemia of chronic disease   . Arthritis    back, hands   . Chronic back pain    radiculopathy and stenosis  . Chronic ischemic heart disease    Severe LVH  . Chronic kidney disease    [name], awaiting Transplant   . Congestive heart failure (CHF) ([place])   . CVA (cerebral infarction) 03/2013  . Diabetes mellitus     Lantus nightly ;type 2  . GERD (gastroesophageal reflux disease)   . H/O hiatal hernia   . History of colon polyps   . Hx of cardiovascular stress test    a.  Lexiscan Myoview (05/2013):  No ischemia, EF 53%; Normal Study  . Hyperlipidemia    takes Lovastatin daily  . Hypertension    takes Amlodipine and Catapres daily    dr Forrestine Him, [facility] - [facility]  . Nocturia   . Obesity   . Peripheral edema    takes Lasix daily  . Sleep apnea    cpap , study in their home, 09/2102- [employer]           . Stroke (HCC)    speech, rt arm weakness  . Urinary frequency       Review of Systems  Constitutional: Negative for chills, fever, malaise/fatigue and weight loss.  HENT: Negative for congestion, nosebleeds, sinus pain and sore throat.   Eyes: Negative for photophobia, pain and discharge.  Respiratory: Positive for shortness of breath.  Negative for cough, hemoptysis, sputum production and wheezing.   Cardiovascular: Negative for chest pain, palpitations, orthopnea and leg swelling.  Gastrointestinal: Negative for abdominal pain, constipation, diarrhea, nausea and vomiting.  Genitourinary: Negative for dysuria, frequency, hematuria and urgency.  Musculoskeletal: Negative for back pain, joint pain, myalgias and neck pain.  Skin: Negative for itching and rash.  Neurological: Negative for tingling, tremors, sensory change, speech change, focal weakness, seizures, weakness and headaches.  Psychiatric/Behavioral: Negative for memory loss, substance abuse and suicidal ideas ([place]). The patient is not nervous/anxious.       Objective:  Physical Exam   Vitals:   11/29/17 1335  BP: (!) 142/70  Pulse: 93  SpO2: 98%  Weight: 228 lb 3.2 oz (103.5 kg)  Height: 5\' 11"  (1.803 m)    RA  Gen: well appearing HENT: OP clear, TM's clear, neck supple PULM: CTA B, normal percussion CV: RRR, no mgr, trace edema GI: BS+, soft, nontender Derm: no cyanosis or rash Psyche: normal mood and affect    CBC    Component Value Date/Time   WBC 6.8 07/26/2017 1451   RBC 4.28 07/26/2017 1451   HGB 12.7 (L) 07/26/2017 1451   HCT 39.2 07/26/2017 1451   PLT 132 (L) 07/26/2017 1451  MCV 91.6 07/26/2017 1451   MCH 29.7 07/26/2017 1451   MCHC 32.4 07/26/2017 1451   RDW 12.8 07/26/2017 1451   LYMPHSABS 1.1 01/02/2017 0030   MONOABS 0.4 01/02/2017 0030   EOSABS 0.1 01/02/2017 0030   BASOSABS 0.0 01/02/2017 0030     Chest imaging: 06/2017 cardiomegaly, normal pulmonary parenchyma without infiltrate or mass, images personally reviewed June 2019 CT chest from [facility] showed "central airway thickening", platelike atelectasis in the left lower lobe, vertebral body changes consistent with DISH there was a question of bronchial thickening due to bronchitis  PFT: 12/2016 ratio normal, FVC 3.12 L 66% predicted, total lung capacity  5.5 L 77% predicted, DLCO 22.05 66% predicted 11/2017 PFT> ratio 82%, FVC 3.55 L 76% predicted, total lung capacity 5.62 L 78% predicted, DLCO 27.10 82% predicted  Labs:  Path:  Echo: 12/2016 TTE> LVEF 60-65%, moderate LVH, moderate LAE, RSVP 62mmHg, mild RV dysfunction June 2019 transthoracic echocardiogram from [facility] showed severe LVH, normal RV size and function, no comment on right ventricular pressure 09/2017 TTE LVEF is preserved at 60 to 65% with impaired left ventricular relaxation, mild aortic stenosis, RV normal size and function, PA pressure within normal limits  Heart Catheterization:  Records from a hospitalization in 05/2017 reviewed where he was treated for AKI with IV fluids.  ID notes from 04/2017 reviewed where he had a fungal infection of his finger.      Assessment & Plan:   Pulmonary hypertension (HCC)  OSA (obstructive sleep apnea)  Discussion: This is a pleasant 67 year old male who was initially sent to me for evaluation of pulmonary hypertension but fortunately on 2 separate echocardiograms performed over the last 12 months there is no evidence of this.  There was a report from [facility] on a CT chest suggestive of airway thickening but lung function testing has been normal on 2 separate occasions now.  So at this time I see no evidence of an underlying pulmonary or pulmonary vascular problem.  He should continue his sleep apnea treatment as directed by Dr. Annamaria Boots.  Plan: Testing from today and from the recent echocardiogram showed no evidence of a lung or pulmonary vessel problem.  You have obstructive sleep apnea: You should keep using CPAP as directed by Dr. Annamaria Boots Continue to follow-up with Dr. Annamaria Boots regarding your CPAP and obstructive sleep apnea  Flu shot is up-to-date  We will see you back in my clinic as needed, follow-up with Dr. Annamaria Boots as previously arranged   Current Outpatient Medications:  .  albuterol (PROVENTIL  HFA;VENTOLIN HFA) 108 (90 Base) MCG/ACT inhaler, Inhale 2 puffs into the lungs every 6 (six) hours as needed for wheezing or shortness of breath., Disp: , Rfl:  .  aspirin EC 81 MG tablet, Take 81 mg by mouth daily. , Disp: , Rfl:  .  atorvastatin (LIPITOR) 40 MG tablet, Take 40 mg by mouth daily. , Disp: , Rfl:  .  calcitRIOL (ROCALTROL) 0.25 MCG capsule, Take 0.25 mcg by mouth See admin instructions. Take 1 capsule (0.25 mcg) by mouth five times weekly - Monday thru Friday, Disp: , Rfl:  .  gabapentin (NEURONTIN) 300 MG capsule, Take 300 mg by mouth 2 (two) times daily., Disp: , Rfl:  .  HYDROcodone-acetaminophen (NORCO) 10-325 MG tablet, Take 1-2 tablets by mouth every 6 (six) hours as needed (pain). , Disp: , Rfl:  .  insulin glargine (LANTUS) 100 unit/mL SOPN, Inject 24 Units into the skin at bedtime. ,  Disp: , Rfl:  .  insulin lispro (HUMALOG KWIKPEN) 100 UNIT/ML KiwkPen, Inject 5-15 Units into the skin See admin instructions. Inject 5 units subcutaneously three times daily before meals adjusted per carb count - add 2 units for every 25 points, Disp: , Rfl:  .  methimazole (TAPAZOLE) 5 MG tablet, Take 5 mg by mouth daily., Disp: , Rfl: 3 .  Multiple Vitamin (MULTIVITAMIN WITH MINERALS) TABS tablet, Take 1 tablet by mouth daily., Disp: , Rfl:  .  mycophenolate (MYFORTIC) 360 MG TBEC EC tablet, Take 360 mg by mouth 2 (two) times daily., Disp: , Rfl:  .  pantoprazole (PROTONIX) 40 MG tablet, Take 1 tablet (40 mg total) by mouth daily., Disp: 90 tablet, Rfl: 3 .  predniSONE (DELTASONE) 5 MG tablet, Take 5 mg by mouth daily with breakfast., Disp: , Rfl:  .  sulfamethoxazole-trimethoprim (BACTRIM,SEPTRA) 400-80 MG tablet, Take 1 tablet by mouth every Monday, Wednesday, and Friday. , Disp: , Rfl:  .  tacrolimus (PROGRAF) 1 MG capsule, Take 2 mg by mouth 2 (two) times daily., Disp: , Rfl: 5 .  tamsulosin (FLOMAX) 0.4 MG CAPS capsule, Take 0.4 mg by mouth daily after breakfast., Disp: , Rfl:  .  vitamin  B-12 (CYANOCOBALAMIN) 1000 MCG tablet, Take 1,000 mcg by mouth 2 (two) times daily. , Disp: , Rfl:

## 2017-11-29 NOTE — Progress Notes (Signed)
PFT completed 11/29/17

## 2017-11-29 NOTE — Patient Instructions (Signed)
Testing from today and from the recent echocardiogram showed no evidence of a lung or pulmonary vessel problem.  You have obstructive sleep apnea: You should keep using CPAP as directed by Dr. Annamaria Boots Continue to follow-up with Dr. Annamaria Boots regarding your CPAP and obstructive sleep apnea  Flu shot is up-to-date  We will see you back in my clinic as needed, follow-up with Dr. Annamaria Boots as previously arranged

## 2017-11-30 DIAGNOSIS — Z940 Kidney transplant status: Secondary | ICD-10-CM | POA: Diagnosis not present

## 2017-11-30 DIAGNOSIS — R809 Proteinuria, unspecified: Secondary | ICD-10-CM | POA: Diagnosis not present

## 2017-11-30 DIAGNOSIS — E1129 Type 2 diabetes mellitus with other diabetic kidney complication: Secondary | ICD-10-CM | POA: Diagnosis not present

## 2017-11-30 DIAGNOSIS — N2581 Secondary hyperparathyroidism of renal origin: Secondary | ICD-10-CM | POA: Diagnosis not present

## 2017-11-30 DIAGNOSIS — I10 Essential (primary) hypertension: Secondary | ICD-10-CM | POA: Diagnosis not present

## 2017-12-05 DIAGNOSIS — Z4822 Encounter for aftercare following kidney transplant: Secondary | ICD-10-CM | POA: Diagnosis not present

## 2017-12-05 DIAGNOSIS — I509 Heart failure, unspecified: Secondary | ICD-10-CM | POA: Diagnosis not present

## 2017-12-08 DIAGNOSIS — G4733 Obstructive sleep apnea (adult) (pediatric): Secondary | ICD-10-CM | POA: Diagnosis not present

## 2017-12-09 DIAGNOSIS — G4733 Obstructive sleep apnea (adult) (pediatric): Secondary | ICD-10-CM | POA: Diagnosis not present

## 2017-12-12 ENCOUNTER — Telehealth: Payer: Self-pay | Admitting: Cardiology

## 2017-12-12 NOTE — Telephone Encounter (Signed)
Noted if nephrology stopped his diuretic then I think that the best person to manage his hypertension if the call back he cannot have multiple physicians changing medications simultaneously

## 2017-12-12 NOTE — Telephone Encounter (Signed)
About a BP issue is all she would tell me

## 2017-12-12 NOTE — Telephone Encounter (Signed)
Dorothy, RN with [facility] calling to inform us that patient's blood pressure has been high around 160/87. He has also had a weight gain of 20 pounds in the last 90 days. His nephrologist took him off of lasix in June 2019. He denies any shortness of breath, or swelling.  She just wanted to inform us. He is supposed to follow up with Dr. Agustin Cree in March 2020. Will route to Dr. Bettina Gavia for recommendations.

## 2017-12-13 NOTE — Telephone Encounter (Signed)
Informed patient to have nephrology follow up on this. Patient verbally understands

## 2017-12-18 ENCOUNTER — Encounter: Payer: Self-pay | Admitting: Internal Medicine

## 2017-12-18 DIAGNOSIS — Z940 Kidney transplant status: Secondary | ICD-10-CM | POA: Diagnosis not present

## 2017-12-18 DIAGNOSIS — I129 Hypertensive chronic kidney disease with stage 1 through stage 4 chronic kidney disease, or unspecified chronic kidney disease: Secondary | ICD-10-CM | POA: Diagnosis not present

## 2017-12-19 DIAGNOSIS — Z940 Kidney transplant status: Secondary | ICD-10-CM | POA: Diagnosis not present

## 2017-12-19 DIAGNOSIS — Z4822 Encounter for aftercare following kidney transplant: Secondary | ICD-10-CM | POA: Diagnosis not present

## 2017-12-27 DIAGNOSIS — N200 Calculus of kidney: Secondary | ICD-10-CM | POA: Diagnosis not present

## 2018-01-01 DIAGNOSIS — M961 Postlaminectomy syndrome, not elsewhere classified: Secondary | ICD-10-CM | POA: Diagnosis not present

## 2018-01-01 DIAGNOSIS — I10 Essential (primary) hypertension: Secondary | ICD-10-CM | POA: Diagnosis not present

## 2018-01-01 DIAGNOSIS — M545 Low back pain: Secondary | ICD-10-CM | POA: Diagnosis not present

## 2018-01-01 DIAGNOSIS — M47812 Spondylosis without myelopathy or radiculopathy, cervical region: Secondary | ICD-10-CM | POA: Diagnosis not present

## 2018-01-04 DIAGNOSIS — I509 Heart failure, unspecified: Secondary | ICD-10-CM | POA: Diagnosis not present

## 2018-01-08 DIAGNOSIS — Z940 Kidney transplant status: Secondary | ICD-10-CM | POA: Diagnosis not present

## 2018-01-08 DIAGNOSIS — Z4822 Encounter for aftercare following kidney transplant: Secondary | ICD-10-CM | POA: Diagnosis not present

## 2018-01-08 DIAGNOSIS — G4733 Obstructive sleep apnea (adult) (pediatric): Secondary | ICD-10-CM | POA: Diagnosis not present

## 2018-01-09 DIAGNOSIS — I129 Hypertensive chronic kidney disease with stage 1 through stage 4 chronic kidney disease, or unspecified chronic kidney disease: Secondary | ICD-10-CM | POA: Diagnosis not present

## 2018-01-10 DIAGNOSIS — L299 Pruritus, unspecified: Secondary | ICD-10-CM | POA: Diagnosis not present

## 2018-01-12 DIAGNOSIS — C44319 Basal cell carcinoma of skin of other parts of face: Secondary | ICD-10-CM | POA: Diagnosis not present

## 2018-01-12 DIAGNOSIS — L570 Actinic keratosis: Secondary | ICD-10-CM | POA: Diagnosis not present

## 2018-01-12 DIAGNOSIS — D485 Neoplasm of uncertain behavior of skin: Secondary | ICD-10-CM | POA: Diagnosis not present

## 2018-01-23 DIAGNOSIS — Z87448 Personal history of other diseases of urinary system: Secondary | ICD-10-CM | POA: Diagnosis not present

## 2018-01-23 DIAGNOSIS — Z940 Kidney transplant status: Secondary | ICD-10-CM | POA: Diagnosis not present

## 2018-01-24 DIAGNOSIS — C44319 Basal cell carcinoma of skin of other parts of face: Secondary | ICD-10-CM | POA: Diagnosis not present

## 2018-01-29 ENCOUNTER — Ambulatory Visit: Payer: Medicare Other | Admitting: Internal Medicine

## 2018-01-29 ENCOUNTER — Encounter: Payer: Self-pay | Admitting: Internal Medicine

## 2018-01-29 DIAGNOSIS — N179 Acute kidney failure, unspecified: Secondary | ICD-10-CM

## 2018-01-29 DIAGNOSIS — G4733 Obstructive sleep apnea (adult) (pediatric): Secondary | ICD-10-CM | POA: Diagnosis not present

## 2018-01-29 DIAGNOSIS — N184 Chronic kidney disease, stage 4 (severe): Secondary | ICD-10-CM | POA: Diagnosis not present

## 2018-01-29 NOTE — Assessment & Plan Note (Signed)
Transplant doing okay, followed by nephrology

## 2018-01-29 NOTE — Patient Instructions (Signed)
We can continue CPAP auto 5-20, mask of choice, humidifier, supplies, airview or card  Please cal if we can help

## 2018-01-29 NOTE — Assessment & Plan Note (Signed)
To benefit from CPAP with improved sleep and no snoring.  Wife confirms.  Download also confirms excellent compliance and control. Plan-continue CPAP auto 5-20

## 2018-01-29 NOTE — Progress Notes (Signed)
HPI  male former smoker followed for OSA, complicated by ESRD/Transplant, DM 2, GERD, CVA, chronic back pain Unattended Home Sleep Test 10/24/12-AHI 24/hour, desaturation to 81%. PFT 12/28/16-mild restriction, mild reduction of diffusion.  No response to bronchodilator.  FVC 3.28/70%, FEV1 2.64/75%, ratio 0.81, TLC 77%, DLCO 66%. -------------------------------------------------------------------------------  10/30/2017- 68 year old male former smoker followed for OSA, DOE complicated by ESRD/transplant, DM 2, GERD, CVA, chronic back pain, Pulmonary Hypertension ([place]) -----OSA: DME [employer]. Pt wears CPAP nightly. Pressure works well for patient. No new supplies.  Albuterol HFA, prednisone 5 mg daily CPAP autopap/Aero flow   We are trying to contact them for download, local office has closed. Wife confirms that he wears it all night every night and it prevents snoring.  He says he sleeps well with his CPAP.  Machine is more than 67 years old.  01/29/2018- 68 year old male former smoker followed for OSA, DOE complicated by ESRD/transplant, DM 2, GERD, CVA, chronic back pain, Pulmonary Hypertension ([place]) -----OSA: DME [facility]PS: Pt wears CPAP nightly and pressure works well; no supplies needed and DL attached.  Body weight today 235 pounds He and his wife report that he is doing very well,sleeping fine. CPAP auto 5-20/ APS Download-compliance 100%, AHI 5.9/hour Says his transplant kidney is functioning well.  ROS-see HPI   + = positive Constitutional:    weight loss, night sweats, fevers, chills, fatigue, lassitude. HEENT:    headaches, difficulty swallowing, tooth/dental problems, sore throat,       sneezing, itching, ear ache, nasal congestion, post nasal drip, snoring CV:    chest pain, orthopnea, PND, swelling in lower extremities, anasarca,                                                dizziness, palpitations Resp:   +shortness of breath with exertion or at rest.    productive cough,   non-productive cough, coughing up of blood.              change in color of mucus.  wheezing.   Skin:    rash or lesions. GI:  No-   heartburn, indigestion, abdominal pain, nausea, vomiting, diarrhea,                 change in bowel habits, loss of appetite GU: dysuria, change in color of urine, no urgency or frequency.   flank pain. MS:   joint pain, stiffness, decreased range of motion, back pain. Neuro-     nothing unusual Psych:  change in mood or affect.  depression or anxiety.   memory loss.  OBJ- Physical Exam General- Alert, Oriented, Affect-appropriate, Distress- none acute, +overweight Skin- rash-none, lesions- none, excoriation- none Lymphadenopathy- none Head- atraumatic            Eyes- Gross vision intact, PERRLA, conjunctivae and secretions clear            Ears- Hearing, canals-normal            Nose- Clear, no-Septal dev, mucus, polyps, erosion, perforation             Throat- Mallampati IV , mucosa clear , drainage- none, tonsils- atrophic, + full dentures Neck- flexible , trachea midline, no stridor , thyroid nl, carotid no bruit Chest - symmetrical excursion , unlabored           Heart/CV- RRR , murmur+ 1-2/6 S Ao ,  no gallop  , no rub, nl s1 s2                           - JVD- none , edema- none, stasis changes- none, varices- none           Lung- clear to P&A, wheeze- none, cough- none , dullness-none, rub- none           Chest wall- + loop recorder Abd-  Br/ Gen/ Rectal- Not done, not indicated Extrem- cyanosis- none, clubbing, none, atrophy- none, strength- nl Neuro- grossly intact to observation

## 2018-02-04 DIAGNOSIS — I509 Heart failure, unspecified: Secondary | ICD-10-CM | POA: Diagnosis not present

## 2018-02-06 DIAGNOSIS — E113299 Type 2 diabetes mellitus with mild nonproliferative diabetic retinopathy without macular edema, unspecified eye: Secondary | ICD-10-CM | POA: Diagnosis not present

## 2018-02-06 DIAGNOSIS — H52223 Regular astigmatism, bilateral: Secondary | ICD-10-CM | POA: Diagnosis not present

## 2018-02-06 DIAGNOSIS — H353120 Nonexudative age-related macular degeneration, left eye, stage unspecified: Secondary | ICD-10-CM | POA: Diagnosis not present

## 2018-02-07 DIAGNOSIS — G4733 Obstructive sleep apnea (adult) (pediatric): Secondary | ICD-10-CM | POA: Diagnosis not present

## 2018-02-07 DIAGNOSIS — Z79899 Other long term (current) drug therapy: Secondary | ICD-10-CM | POA: Diagnosis not present

## 2018-02-07 DIAGNOSIS — Z5181 Encounter for therapeutic drug level monitoring: Secondary | ICD-10-CM | POA: Diagnosis not present

## 2018-02-07 DIAGNOSIS — Z940 Kidney transplant status: Secondary | ICD-10-CM | POA: Diagnosis not present

## 2018-02-08 DIAGNOSIS — G4733 Obstructive sleep apnea (adult) (pediatric): Secondary | ICD-10-CM | POA: Diagnosis not present

## 2018-02-20 DIAGNOSIS — I129 Hypertensive chronic kidney disease with stage 1 through stage 4 chronic kidney disease, or unspecified chronic kidney disease: Secondary | ICD-10-CM | POA: Diagnosis not present

## 2018-02-20 DIAGNOSIS — Z940 Kidney transplant status: Secondary | ICD-10-CM | POA: Diagnosis not present

## 2018-02-26 DIAGNOSIS — N2581 Secondary hyperparathyroidism of renal origin: Secondary | ICD-10-CM | POA: Diagnosis not present

## 2018-02-26 DIAGNOSIS — Z940 Kidney transplant status: Secondary | ICD-10-CM | POA: Diagnosis not present

## 2018-02-26 DIAGNOSIS — E1129 Type 2 diabetes mellitus with other diabetic kidney complication: Secondary | ICD-10-CM | POA: Diagnosis not present

## 2018-02-26 DIAGNOSIS — I10 Essential (primary) hypertension: Secondary | ICD-10-CM | POA: Diagnosis not present

## 2018-02-26 DIAGNOSIS — R809 Proteinuria, unspecified: Secondary | ICD-10-CM | POA: Diagnosis not present

## 2018-03-07 DIAGNOSIS — I509 Heart failure, unspecified: Secondary | ICD-10-CM | POA: Diagnosis not present

## 2018-03-09 DIAGNOSIS — G4733 Obstructive sleep apnea (adult) (pediatric): Secondary | ICD-10-CM | POA: Diagnosis not present

## 2018-03-10 DIAGNOSIS — G4733 Obstructive sleep apnea (adult) (pediatric): Secondary | ICD-10-CM | POA: Diagnosis not present

## 2018-03-15 DIAGNOSIS — M545 Low back pain: Secondary | ICD-10-CM | POA: Diagnosis not present

## 2018-03-15 DIAGNOSIS — M961 Postlaminectomy syndrome, not elsewhere classified: Secondary | ICD-10-CM | POA: Diagnosis not present

## 2018-03-15 DIAGNOSIS — I10 Essential (primary) hypertension: Secondary | ICD-10-CM | POA: Diagnosis not present

## 2018-03-15 DIAGNOSIS — M47812 Spondylosis without myelopathy or radiculopathy, cervical region: Secondary | ICD-10-CM | POA: Diagnosis not present

## 2018-03-21 DIAGNOSIS — E7800 Pure hypercholesterolemia, unspecified: Secondary | ICD-10-CM | POA: Diagnosis not present

## 2018-03-21 DIAGNOSIS — S91009A Unspecified open wound, unspecified ankle, initial encounter: Secondary | ICD-10-CM | POA: Diagnosis not present

## 2018-03-21 DIAGNOSIS — E1121 Type 2 diabetes mellitus with diabetic nephropathy: Secondary | ICD-10-CM | POA: Diagnosis not present

## 2018-03-21 DIAGNOSIS — I259 Chronic ischemic heart disease, unspecified: Secondary | ICD-10-CM | POA: Diagnosis not present

## 2018-03-23 ENCOUNTER — Telehealth: Payer: Self-pay | Admitting: Cardiology

## 2018-03-23 NOTE — Telephone Encounter (Signed)
Left message for patient to return call.

## 2018-03-23 NOTE — Telephone Encounter (Signed)
Patient's wife called back.

## 2018-03-23 NOTE — Telephone Encounter (Signed)
[facility] RN called and they called stating that patient has lost 3 pds and he thinks that is a lot.. patient has an appt on 03/28/2018.Marland KitchenFYI please call patient if he needs any more details.

## 2018-03-27 ENCOUNTER — Telehealth: Payer: Self-pay | Admitting: Emergency Medicine

## 2018-03-27 DIAGNOSIS — E11621 Type 2 diabetes mellitus with foot ulcer: Secondary | ICD-10-CM | POA: Diagnosis not present

## 2018-03-27 DIAGNOSIS — L97312 Non-pressure chronic ulcer of right ankle with fat layer exposed: Secondary | ICD-10-CM | POA: Diagnosis not present

## 2018-03-27 DIAGNOSIS — E11622 Type 2 diabetes mellitus with other skin ulcer: Secondary | ICD-10-CM | POA: Diagnosis not present

## 2018-03-27 DIAGNOSIS — S91001A Unspecified open wound, right ankle, initial encounter: Secondary | ICD-10-CM | POA: Diagnosis not present

## 2018-03-27 NOTE — Telephone Encounter (Signed)
Please see recent phone call

## 2018-03-27 NOTE — Telephone Encounter (Signed)
Called patient and rescheduled patient per Dr. Agustin Cree for appointment tomorrow. Patient agreed to rescheduled. He reports he doesn't have any urgent needs for Dr. Agustin Cree at this time and will call if that changes. I followed up on a previous phone call and he reports that there is no problem with his weight at this time and that [employer] helps him manage this. No further questions.

## 2018-03-28 ENCOUNTER — Ambulatory Visit: Payer: Medicare Other | Admitting: Cardiology

## 2018-04-03 DIAGNOSIS — I120 Hypertensive chronic kidney disease with stage 5 chronic kidney disease or end stage renal disease: Secondary | ICD-10-CM | POA: Diagnosis not present

## 2018-04-03 DIAGNOSIS — E11622 Type 2 diabetes mellitus with other skin ulcer: Secondary | ICD-10-CM | POA: Diagnosis not present

## 2018-04-03 DIAGNOSIS — N186 End stage renal disease: Secondary | ICD-10-CM | POA: Diagnosis not present

## 2018-04-03 DIAGNOSIS — E1122 Type 2 diabetes mellitus with diabetic chronic kidney disease: Secondary | ICD-10-CM | POA: Diagnosis not present

## 2018-04-03 DIAGNOSIS — L97312 Non-pressure chronic ulcer of right ankle with fat layer exposed: Secondary | ICD-10-CM | POA: Diagnosis not present

## 2018-04-05 DIAGNOSIS — I509 Heart failure, unspecified: Secondary | ICD-10-CM | POA: Diagnosis not present

## 2018-04-08 DIAGNOSIS — G4733 Obstructive sleep apnea (adult) (pediatric): Secondary | ICD-10-CM | POA: Diagnosis not present

## 2018-04-09 DIAGNOSIS — G4733 Obstructive sleep apnea (adult) (pediatric): Secondary | ICD-10-CM | POA: Diagnosis not present

## 2018-04-10 DIAGNOSIS — L97319 Non-pressure chronic ulcer of right ankle with unspecified severity: Secondary | ICD-10-CM | POA: Diagnosis not present

## 2018-04-10 DIAGNOSIS — Z09 Encounter for follow-up examination after completed treatment for conditions other than malignant neoplasm: Secondary | ICD-10-CM | POA: Diagnosis not present

## 2018-04-10 DIAGNOSIS — M549 Dorsalgia, unspecified: Secondary | ICD-10-CM | POA: Diagnosis not present

## 2018-04-10 DIAGNOSIS — I10 Essential (primary) hypertension: Secondary | ICD-10-CM | POA: Diagnosis not present

## 2018-04-10 DIAGNOSIS — E11622 Type 2 diabetes mellitus with other skin ulcer: Secondary | ICD-10-CM | POA: Diagnosis not present

## 2018-04-10 DIAGNOSIS — Z8631 Personal history of diabetic foot ulcer: Secondary | ICD-10-CM | POA: Diagnosis not present

## 2018-04-10 DIAGNOSIS — E785 Hyperlipidemia, unspecified: Secondary | ICD-10-CM | POA: Diagnosis not present

## 2018-04-13 ENCOUNTER — Telehealth: Payer: Self-pay | Admitting: Cardiology

## 2018-04-13 NOTE — Telephone Encounter (Signed)
Virtual Visit Pre-Appointment Phone Call  Steps For Call:  1. Confirm consent - "In the setting of the current Covid19 crisis, you are scheduled for a (phone or video) visit with your provider on (date) at (time).  Just as we do with many in-office visits, in order for you to participate in this visit, we must obtain consent.  If you'd like, I can send this to your mychart (if signed up) or email for you to review.  Otherwise, I can obtain your verbal consent now.  All virtual visits are billed to your insurance company just like a normal visit would be.  By agreeing to a virtual visit, we'd like you to understand that the technology does not allow for your provider to perform an examination, and thus may limit your provider's ability to fully assess your condition.  Finally, though the technology is pretty good, we cannot assure that it will always work on either your or our end, and in the setting of a video visit, we may have to convert it to a phone-only visit.  In either situation, we cannot ensure that we have a secure connection.  Are you willing to proceed?"  2. Give patient instructions for WebEx download to smartphone as below if video visit  3. Advise patient to be prepared with any vital sign or heart rhythm information, their current medicines, and a piece of paper and pen handy for any instructions they may receive the day of their visit  4. Inform patient they will receive a phone call 15 minutes prior to their appointment time (may be from unknown caller ID) so they should be prepared to answer  5. Confirm that appointment type is correct in Epic appointment notes (video vs telephone)    TELEPHONE CALL NOTE  Jon [name] has been deemed a candidate for a follow-up tele-health visit to limit community exposure during the Covid-19 pandemic. I spoke with the patient via phone to ensure availability of phone/video source, confirm preferred email & phone number, and discuss  instructions and expectations.  I reminded Jon [name] to be prepared with any vital sign and/or heart rhythm information that could potentially be obtained via home monitoring, at the time of his visit. I reminded Jon [name] to expect a phone call at the time of his visit if his visit.  Did the patient verbally acknowledge consent to treatment? YES  Jon [name] 04/13/2018 12:13 PM   DOWNLOADING THE [employer], go to [employer] and type in WebEx in the search bar. [place] [employer], the blue/green circle. The app is free but as with any other app downloads, their phone may require them to verify saved payment information or Apple password. The patient does NOT have to create an account.  - If Android, ask patient to go to [employer] and type in WebEx in the search bar. [place] [employer], the blue/green circle. The app is free but as with any other app downloads, their phone may require them to verify saved payment information or Android password. The patient does NOT have to create an account.   CONSENT FOR TELE-HEALTH VISIT - PLEASE REVIEW  I hereby voluntarily request, consent and authorize [place] and its employed or contracted physicians, physician assistants, nurse practitioners or other licensed health care professionals (the Practitioner), to provide me with telemedicine health care services (the Services") as deemed necessary by the treating Practitioner. I  acknowledge and consent to receive the Services by the Practitioner via telemedicine. I understand that the telemedicine visit will involve communicating with the Practitioner through live audiovisual communication technology and the disclosure of certain medical information by electronic transmission. I acknowledge that I have been given the opportunity to request an in-person assessment or other available alternative prior to the telemedicine visit and am  voluntarily participating in the telemedicine visit.  I understand that I have the right to withhold or withdraw my consent to the use of telemedicine in the course of my care at any time, without affecting my right to future care or treatment, and that the Practitioner or I may terminate the telemedicine visit at any time. I understand that I have the right to inspect all information obtained and/or recorded in the course of the telemedicine visit and may receive copies of available information for a reasonable fee.  I understand that some of the potential risks of receiving the Services via telemedicine include:   Delay or interruption in medical evaluation due to technological equipment failure or disruption;  Information transmitted may not be sufficient (e.g. poor resolution of images) to allow for appropriate medical decision making by the Practitioner; and/or   In rare instances, security protocols could fail, causing a breach of personal health information.  Furthermore, I acknowledge that it is my responsibility to provide information about my medical history, conditions and care that is complete and accurate to the best of my ability. I acknowledge that Practitioner's advice, recommendations, and/or decision may be based on factors not within their control, such as incomplete or inaccurate data provided by me or distortions of diagnostic images or specimens that may result from electronic transmissions. I understand that the practice of medicine is not an exact science and that Practitioner makes no warranties or guarantees regarding treatment outcomes. I acknowledge that I will receive a copy of this consent concurrently upon execution via email to the email address I last provided but may also request a printed copy by calling the office of [place].    I understand that my insurance will be billed for this visit.   I have read or had this consent read to me.  I understand the  contents of this consent, which adequately explains the benefits and risks of the Services being provided via telemedicine.   I have been provided ample opportunity to ask questions regarding this consent and the Services and have had my questions answered to my satisfaction.  I give my informed consent for the services to be provided through the use of telemedicine in my medical care  By participating in this telemedicine visit I agree to the above.

## 2018-04-23 DIAGNOSIS — Z940 Kidney transplant status: Secondary | ICD-10-CM | POA: Diagnosis not present

## 2018-04-23 DIAGNOSIS — E875 Hyperkalemia: Secondary | ICD-10-CM | POA: Diagnosis not present

## 2018-04-24 ENCOUNTER — Telehealth (INDEPENDENT_AMBULATORY_CARE_PROVIDER_SITE_OTHER): Payer: Medicare Other | Admitting: Cardiology

## 2018-04-24 ENCOUNTER — Encounter: Payer: Self-pay | Admitting: Cardiology

## 2018-04-24 ENCOUNTER — Other Ambulatory Visit: Payer: Self-pay

## 2018-04-24 VITALS — BP 157/58 | HR 82 | Wt 234.5 lb

## 2018-04-24 DIAGNOSIS — I712 Thoracic aortic aneurysm, without rupture: Secondary | ICD-10-CM

## 2018-04-24 DIAGNOSIS — Z940 Kidney transplant status: Secondary | ICD-10-CM

## 2018-04-24 DIAGNOSIS — M7989 Other specified soft tissue disorders: Secondary | ICD-10-CM

## 2018-04-24 DIAGNOSIS — Z949 Transplanted organ and tissue status, unspecified: Secondary | ICD-10-CM

## 2018-04-24 DIAGNOSIS — R0609 Other forms of dyspnea: Secondary | ICD-10-CM

## 2018-04-24 DIAGNOSIS — I7121 Aneurysm of the ascending aorta, without rupture: Secondary | ICD-10-CM

## 2018-04-24 DIAGNOSIS — I158 Other secondary hypertension: Secondary | ICD-10-CM

## 2018-04-24 DIAGNOSIS — I272 Pulmonary hypertension, unspecified: Secondary | ICD-10-CM

## 2018-04-24 NOTE — Addendum Note (Signed)
Addended by: Ashok Norris on: 04/24/2018 10:24 AM   Modules accepted: Orders

## 2018-04-24 NOTE — Patient Instructions (Signed)
Medication Instructions:  Your physician recommends that you continue on your current medications as directed. Please refer to the Current Medication list given to you today.  If you need a refill on your cardiac medications before your next appointment, please call your pharmacy.   Lab work: None.  If you have labs (blood work) drawn today and your tests are completely normal, you will receive your results only by: [name] MyChart Message (if you have MyChart) OR . A paper copy in the mail If you have any lab test that is abnormal or we need to change your treatment, we will call you to review the results.  Testing/Procedures: Your physician has requested that you have an echocardiogram. Echocardiography is a painless test that uses sound waves to create images of your heart. It provides your doctor with information about the size and shape of your heart and how well your heart's chambers and valves are working. This procedure takes approximately one hour. There are no restrictions for this procedure.    Follow-Up: At [facility], you and your health needs are our priority.  As part of our continuing mission to provide you with exceptional heart care, we have created designated Provider Care Teams.  These Care Teams include your primary Cardiologist (physician) and Advanced Practice Providers (APPs -  Physician Assistants and Nurse Practitioners) who all work together to provide you with the care you need, when you need it. You will need a follow up appointment in 6 months.  Please call our office 2 months in advance to schedule this appointment.  You may see [name], MD or another member of our [place] Provider Team in [place]: Shirlee More, MD . Jyl Heinz, MD  Any Other Special Instructions Will Be Listed Below (If Applicable).   Echocardiogram An echocardiogram is a procedure that uses painless sound waves (ultrasound) to produce an image of the heart. Images from  an echocardiogram can provide important information about:  Signs of coronary artery disease (CAD).  Aneurysm detection. An aneurysm is a weak or damaged part of an artery wall that bulges out from the normal force of blood pumping through the body.  Heart size and shape. Changes in the size or shape of the heart can be associated with certain conditions, including heart failure, aneurysm, and CAD.  Heart muscle function.  Heart valve function.  Signs of a past heart attack.  Fluid buildup around the heart.  Thickening of the heart muscle.  A tumor or infectious growth around the heart valves. Tell a health care provider about:  Any allergies you have.  All medicines you are taking, including vitamins, herbs, eye drops, creams, and over-the-counter medicines.  Any blood disorders you have.  Any surgeries you have had.  Any medical conditions you have.  Whether you are pregnant or may be pregnant. What are the risks? Generally, this is a safe procedure. However, problems may occur, including:  Allergic reaction to dye (contrast) that may be used during the procedure. What happens before the procedure? No specific preparation is needed. You may eat and drink normally. What happens during the procedure?   An IV tube may be inserted into one of your veins.  You may receive contrast through this tube. A contrast is an injection that improves the quality of the pictures from your heart.  A gel will be applied to your chest.  A wand-like tool (transducer) will be moved over your chest. The gel will help to transmit the sound  waves from the transducer.  The sound waves will harmlessly bounce off of your heart to allow the heart images to be captured in real-time motion. The images will be recorded on a computer. The procedure may vary among health care providers and hospitals. What happens after the procedure?  You may return to your normal, everyday life, including diet,  activities, and medicines, unless your health care provider tells you not to do that. Summary  An echocardiogram is a procedure that uses painless sound waves (ultrasound) to produce an image of the heart.  Images from an echocardiogram can provide important information about the size and shape of your heart, heart muscle function, heart valve function, and fluid buildup around your heart.  You do not need to do anything to prepare before this procedure. You may eat and drink normally.  After the echocardiogram is completed, you may return to your normal, everyday life, unless your health care provider tells you not to do that. This information is not intended to replace advice given to you by your health care provider. Make sure you discuss any questions you have with your health care provider. Document Released: 12/25/1999 Document Revised: 01/30/2016 Document Reviewed: 01/30/2016 Elsevier Interactive Patient Education  2019 [employer].

## 2018-04-24 NOTE — Progress Notes (Signed)
Virtual Visit via Telephone Note   This visit type was conducted due to national recommendations for restrictions regarding the COVID-19 Pandemic (e.g. social distancing) in an effort to limit this patient's exposure and mitigate transmission in our community.  Due to his co-morbid illnesses, this patient is at least at moderate risk for complications without adequate follow up.  This format is felt to be most appropriate for this patient at this time.  The patient did not have access to video technology/had technical difficulties with video requiring transitioning to audio format only (telephone).  All issues noted in this document were discussed and addressed.  No physical exam could be performed with this format.  Please refer to the patient's chart for his  consent to telehealth for [facility].  Evaluation Performed:  Follow-up visit  This visit type was conducted due to national recommendations for restrictions regarding the COVID-19 Pandemic (e.g. social distancing).  This format is felt to be most appropriate for this patient at this time.  All issues noted in this document were discussed and addressed.  No physical exam was performed (except for noted visual exam findings with Video Visits).  Please refer to the patient's chart (MyChart message for video visits and phone note for telephone visits) for the patient's consent to telehealth for [facility].  Date:  04/24/2018  ID: Jon [name], DOB [birth year], MRN [id number]   Patient Location:  [place] [address]   Provider location:   [place] Office  PCP:  [name], MD  Cardiologist:  [name], MD     Chief Complaint: Doing well  History of Present Illness:    Jon [name] is a 67 y.o. male  who presents via audio/video conferencing for a telehealth visit today.  With the kidney transplant, also questionable pulmonary hypertension, hypertension, we have a video  visit today to talk about his problems.  He does not have any shortness of breath he said he walks and does what he wants with no difficulties.  Denies having any chest pain tightness squeezing pressure burning chest no swelling of lower extremities.  I met him initially after echocardiogram being done echocardiogram showed pulmonary pressure 48 mmHg.  However, I repeated echocardiogram in my office and showed no evidence of significant pulmonary hypertension.  Overall clinically he is doing well.   The patient does not have symptoms concerning for COVID-19 infection (fever, chills, cough, or new SHORTNESS OF BREATH).    Prior CV studies:   The following studies were reviewed today:  Requirement reviewed from the office showing no significant pulmonary hypertension     Past Medical History:  Diagnosis Date  . Abnormal gait   . Anemia of chronic disease   . Arthritis    back, hands   . Chronic back pain    radiculopathy and stenosis  . Chronic ischemic heart disease    Severe LVH  . Chronic kidney disease    [name], awaiting Transplant   . Congestive heart failure (CHF) ([place])   . CVA (cerebral infarction) 03/2013  . Diabetes mellitus     Lantus nightly ;type 2  . GERD (gastroesophageal reflux disease)   . H/O hiatal hernia   . History of colon polyps   . Hx of cardiovascular stress test    a.  Lexiscan Myoview (05/2013):  No ischemia, EF 53%; Normal Study  . Hyperlipidemia    takes Lovastatin daily  . Hypertension  takes Amlodipine and Catapres daily    dr Forrestine Him, Loop Recorder - [facility]  . Nocturia   . Obesity   . Peripheral edema    takes Lasix daily  . Sleep apnea    cpap , study in their home, 09/2102- [facility]           . Stroke (HCC)    speech, rt arm weakness  . Urinary frequency     Past Surgical History:  Procedure Laterality Date  . AV FISTULA PLACEMENT Left 04/03/2013   Procedure: ARTERIOVENOUS (AV) FISTULA CREATION- LEFT RADIOCEPHALIC VS  BRACHIOCEPHALIC;  Surgeon: Conrad Washougal, MD;  Location: [place];  Service: Vascular;  Laterality: Left;  . AV FISTULA PLACEMENT Left 05/14/2013   Procedure: LEFT BRACHIOCEPHALIC ARTERIOVENOUS (AV) FISTULA CREATION;  Surgeon: Conrad , MD;  Location: [place];  Service: Vascular;  Laterality: Left;  . BACK SURGERY  12/2012   2003- 1st back surgery & then 2014- fusion  . COLONOSCOPY    . ESOPHAGOGASTRODUODENOSCOPY    . HEMORRHOID SURGERY    . KIDNEY TRANSPLANT  10/01/2013  . left knee surgery    . LOOP RECORDER IMPLANT  03/19/13   MDT LinQ implanted by Dr Rayann Heman for cryptogenic stroke  . LOOP RECORDER IMPLANT N/A 03/19/2013   Procedure: LOOP RECORDER IMPLANT;  Surgeon: Coralyn Mark, MD;  Location: [place] CATH LAB;  Service: Cardiovascular;  Laterality: N/A;  . NEPHRECTOMY TRANSPLANTED ORGAN    . NEUROPLASTY / TRANSPOSITION MEDIAN NERVE AT CARPAL TUNNEL BILATERAL    . right leg surgery     pin in place  . right wrist surgery    . TEE WITHOUT CARDIOVERSION N/A 03/19/2013   Procedure: TRANSESOPHAGEAL ECHOCARDIOGRAM (TEE);  Surgeon: [name], MD;  Location[facility];  Service: Cardiovascular;  Laterality: N/A;  . THYROID SURGERY  10/13/2015   Performed at [facility] with surgical pathology revealed consistent with benign follicular nodule ([name] category II)     Current Meds  Medication Sig  . aspirin EC 81 MG tablet Take 81 mg by mouth daily.   [name] atorvastatin (LIPITOR) 40 MG tablet Take 40 mg by mouth daily.   . calcitRIOL (ROCALTROL) 0.25 MCG capsule Take 0.25 mcg by mouth See admin instructions. Take 1 capsule (0.25 mcg) by mouth five times weekly - Monday thru Friday  . furosemide (LASIX) 40 MG tablet Take 40 mg by mouth daily.  [name] gabapentin (NEURONTIN) 300 MG capsule Take 300 mg by mouth 2 (two) times daily.  [name] HYDROcodone-acetaminophen (NORCO) 10-325 MG tablet Take 1-2 tablets by mouth every 6 (six) hours as needed (pain).   . insulin glargine (LANTUS) 100 unit/mL SOPN Inject 24  Units into the skin at bedtime.   . insulin lispro (HUMALOG KWIKPEN) 100 UNIT/ML KiwkPen Inject 5-15 Units into the skin See admin instructions. Inject 5 units subcutaneously three times daily before meals adjusted per carb count - add 2 units for every 25 points  . methimazole (TAPAZOLE) 5 MG tablet Take 5 mg by mouth daily.  . Multiple Vitamin (MULTIVITAMIN WITH MINERALS) TABS tablet Take 1 tablet by mouth daily.  . mycophenolate (MYFORTIC) 360 MG TBEC EC tablet Take 360 mg by mouth 2 (two) times daily.  . pantoprazole (PROTONIX) 40 MG tablet Take 1 tablet (40 mg total) by mouth daily.  . predniSONE (DELTASONE) 5 MG tablet Take 5 mg by mouth daily with breakfast.  . sulfamethoxazole-trimethoprim (BACTRIM,SEPTRA) 400-80 MG tablet Take 1 tablet by mouth 2 (two) times daily.   [name]  tacrolimus (PROGRAF) 1 MG capsule Take 2 mg by mouth 2 (two) times daily.  . tamsulosin (FLOMAX) 0.4 MG CAPS capsule Take 0.4 mg by mouth daily after breakfast.  . vitamin B-12 (CYANOCOBALAMIN) 1000 MCG tablet Take 1,000 mcg by mouth 2 (two) times daily.       Family History: The patient's family history includes Diabetes in his brother; Heart disease in his father; Hyperlipidemia in his father and son; Hypertension in his father and mother; Renal Disease in his father.   ROS:   Please see the history of present illness.     All other systems reviewed and are negative.   Labs/Other Tests and Data Reviewed:     Recent Labs: 06/18/2017: B Natriuretic Peptide 131.8 07/26/2017: ALT 39; BUN 26; Creatinine, Ser 1.60; Hemoglobin 12.7; Magnesium 1.9; Platelets 132; Potassium 4.5; Sodium 141; TSH 0.013  Recent Lipid Panel    Component Value Date/Time   CHOL 137 01/02/2017 0343   TRIG 81 01/02/2017 0343   HDL 38 (L) 01/02/2017 0343   CHOLHDL 3.6 01/02/2017 0343   VLDL 16 01/02/2017 0343   LDLCALC 83 01/02/2017 0343      Exam:    Vital Signs:  BP (!) 157/58   Pulse 82   Wt 234 lb 8 oz (106.4 kg)   BMI 32.71  kg/m     Wt Readings from Last 3 Encounters:  04/24/18 234 lb 8 oz (106.4 kg)  01/29/18 235 lb 12.8 oz (107 kg)  11/29/17 228 lb 3.2 oz (103.5 kg)     Well nourished, well developed male in no acute distress. Alert awake and x3 with talk of the phone we were unsuccessful to establish video link.  Therefore this is only phone visit.  He is quite cheerful and happy to be able to talk to me.  Diagnosis for this visit:   1. Ascending aortic aneurysm ([place])   2. Swelling of lower extremity   3. Dyspnea on exertion   4. History of kidney transplant   5. Hypertension associated with transplantation   6. Pulmonary hypertension, unspecified ([place])      ASSESSMENT & PLAN:    1.  Ascending octagon was only mild enlargement will do evaluation after coronavirus situation will stabilize. 2.  Swelling of lower extremities denies having any. 3.  Dyspnea on exertion.  Denies having any. 4.  History of kidney transplant followed by transplant team. 5.  Hypertension well controlled continue present management. 6.  Pulmonary hypertension last echocardiogram showed normal pulmonary pressure.  We will schedule him to have echocardiogram as an outpatient.  Will wait for coronary virus restriction to be lifted.  He will be acuity level 3.  Likely he is doing well clinically.  COVID-19 Education: The signs and symptoms of COVID-19 were discussed with the patient and how to seek care for testing (follow up with PCP or arrange E-visit).  The importance of social distancing was discussed today.  Patient Risk:   After full review of this patients clinical status, I feel that they are at least moderate risk at this time.  Time:   Today, I have spent 16 minutes with the patient with telehealth technology discussing pt health issues. Visit was finished at 10:13 AM.    Medication Adjustments/Labs and Tests Ordered: Current medicines are reviewed at length with the patient today.  Concerns regarding  medicines are outlined above.  No orders of the defined types were placed in this encounter.  Medication changes: No orders of the defined  types were placed in this encounter.    Disposition: Echocardiogram, acuity level 3, follow-up visit in 6 months  Signed, Park Liter, MD, [facility] 04/24/2018 10:15 AM    [place]

## 2018-04-26 DIAGNOSIS — E0590 Thyrotoxicosis, unspecified without thyrotoxic crisis or storm: Secondary | ICD-10-CM | POA: Diagnosis not present

## 2018-04-30 DIAGNOSIS — E0590 Thyrotoxicosis, unspecified without thyrotoxic crisis or storm: Secondary | ICD-10-CM | POA: Diagnosis not present

## 2018-05-06 DIAGNOSIS — I509 Heart failure, unspecified: Secondary | ICD-10-CM | POA: Diagnosis not present

## 2018-05-09 DIAGNOSIS — G4733 Obstructive sleep apnea (adult) (pediatric): Secondary | ICD-10-CM | POA: Diagnosis not present

## 2018-05-10 DIAGNOSIS — E785 Hyperlipidemia, unspecified: Secondary | ICD-10-CM | POA: Diagnosis not present

## 2018-05-10 DIAGNOSIS — I10 Essential (primary) hypertension: Secondary | ICD-10-CM | POA: Diagnosis not present

## 2018-05-10 DIAGNOSIS — G4733 Obstructive sleep apnea (adult) (pediatric): Secondary | ICD-10-CM | POA: Diagnosis not present

## 2018-05-31 DIAGNOSIS — N2581 Secondary hyperparathyroidism of renal origin: Secondary | ICD-10-CM | POA: Diagnosis not present

## 2018-05-31 DIAGNOSIS — R809 Proteinuria, unspecified: Secondary | ICD-10-CM | POA: Diagnosis not present

## 2018-05-31 DIAGNOSIS — E1129 Type 2 diabetes mellitus with other diabetic kidney complication: Secondary | ICD-10-CM | POA: Diagnosis not present

## 2018-05-31 DIAGNOSIS — I10 Essential (primary) hypertension: Secondary | ICD-10-CM | POA: Diagnosis not present

## 2018-05-31 DIAGNOSIS — Z940 Kidney transplant status: Secondary | ICD-10-CM | POA: Diagnosis not present

## 2018-06-05 DIAGNOSIS — I509 Heart failure, unspecified: Secondary | ICD-10-CM | POA: Diagnosis not present

## 2018-06-08 ENCOUNTER — Other Ambulatory Visit: Payer: Self-pay

## 2018-06-08 ENCOUNTER — Ambulatory Visit (INDEPENDENT_AMBULATORY_CARE_PROVIDER_SITE_OTHER): Payer: Medicare Other

## 2018-06-08 DIAGNOSIS — I158 Other secondary hypertension: Secondary | ICD-10-CM

## 2018-06-08 DIAGNOSIS — Z949 Transplanted organ and tissue status, unspecified: Secondary | ICD-10-CM | POA: Diagnosis not present

## 2018-06-08 DIAGNOSIS — I272 Pulmonary hypertension, unspecified: Secondary | ICD-10-CM | POA: Diagnosis not present

## 2018-06-08 DIAGNOSIS — R0609 Other forms of dyspnea: Secondary | ICD-10-CM

## 2018-06-08 NOTE — Progress Notes (Signed)
Complete echocardiogram has been performed.  Jimmy Antwanette Wesche RDCS, RVT 

## 2018-06-09 DIAGNOSIS — I10 Essential (primary) hypertension: Secondary | ICD-10-CM | POA: Diagnosis not present

## 2018-06-09 DIAGNOSIS — G4733 Obstructive sleep apnea (adult) (pediatric): Secondary | ICD-10-CM | POA: Diagnosis not present

## 2018-06-15 DIAGNOSIS — M961 Postlaminectomy syndrome, not elsewhere classified: Secondary | ICD-10-CM | POA: Diagnosis not present

## 2018-06-19 DIAGNOSIS — E1121 Type 2 diabetes mellitus with diabetic nephropathy: Secondary | ICD-10-CM | POA: Diagnosis not present

## 2018-06-26 DIAGNOSIS — E1121 Type 2 diabetes mellitus with diabetic nephropathy: Secondary | ICD-10-CM | POA: Diagnosis not present

## 2018-06-26 DIAGNOSIS — I10 Essential (primary) hypertension: Secondary | ICD-10-CM | POA: Diagnosis not present

## 2018-06-26 DIAGNOSIS — Z9181 History of falling: Secondary | ICD-10-CM | POA: Diagnosis not present

## 2018-06-27 DIAGNOSIS — Z7689 Persons encountering health services in other specified circumstances: Secondary | ICD-10-CM | POA: Diagnosis not present

## 2018-07-06 DIAGNOSIS — I509 Heart failure, unspecified: Secondary | ICD-10-CM | POA: Diagnosis not present

## 2018-07-10 DIAGNOSIS — E785 Hyperlipidemia, unspecified: Secondary | ICD-10-CM | POA: Diagnosis not present

## 2018-07-10 DIAGNOSIS — G4733 Obstructive sleep apnea (adult) (pediatric): Secondary | ICD-10-CM | POA: Diagnosis not present

## 2018-07-10 DIAGNOSIS — I10 Essential (primary) hypertension: Secondary | ICD-10-CM | POA: Diagnosis not present

## 2018-07-11 ENCOUNTER — Other Ambulatory Visit: Payer: Self-pay | Admitting: *Deleted

## 2018-07-11 NOTE — Patient Outreach (Signed)
[place] [facility]) Care Management  07/11/2018  Jon [name] 28-Dec-1950 [id number]   [name] received telephone call from patient.  [employer] compliance verified. 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 Per patient he is able to afford medication. Patient stated that he is working very closely with his doctors. Patient declined services at this time but agreed to have information mailed to him for future needs.  Plan: RN sent outreach letter and [place] Management [phone]

## 2018-07-11 NOTE — Patient Outreach (Signed)
[place] [facility]) Care Management  07/11/2018  Jon [name] 29-Jan-1950 [id number]   [name] attempted screening outreach call to patient.  Patient was unavailable. HIPPA compliance voicemail message left with return callback number.  Plan: RN will call patient again within 10 days.  [place] Care Management [phone]

## 2018-08-05 DIAGNOSIS — I509 Heart failure, unspecified: Secondary | ICD-10-CM | POA: Diagnosis not present

## 2018-08-07 ENCOUNTER — Other Ambulatory Visit: Payer: Self-pay | Admitting: *Deleted

## 2018-08-07 ENCOUNTER — Other Ambulatory Visit: Payer: Self-pay | Admitting: Pharmacist

## 2018-08-07 NOTE — Patient Outreach (Signed)
[place] [facility]) Care Management  [name]   08/07/2018  Jon [name] Jul 05, 1950 [id number]  Reason for referral: Medication Assistance  Referral source: [place] Current insurance: [facility]  PMHx includes but not limited to:  Chronic ischemic heart disease (severe LVH), HFpEF, HTN, HLD, CKD-IV s/p renal transplant '15 on immunosuppression, hyperthyroidism, hx CVA, T2DM, GERD, pulmonary HTN, AAA  Outreach:  Unsuccessful telephone call attempt #1 to patient. HIPAA compliant voicemail left requesting a return call  Plan:  -I will make another outreach attempt to patient within 3-4 business days.    Ralene Bathe, PharmD, [place] [phone]

## 2018-08-07 NOTE — Patient Outreach (Signed)
[place] [facility]) Care Management  08/07/2018  CHRISTINE SCHIEFELBEIN Aug 22, 1950 [id number]   Patient called the [place] and left a message that he wanted to talk about getting some assistance with medication.   RN Health Coach telephone call to patient. Patient had received the brochure and information about [facility]. Patient stated that he thinks he would like to see if he could get some assistance with his medication Tradjenta. Per patient he had been getting some samples from his physician.  Plan: RN Health Coach referred to pharmacy for medication assistance.   [place] Care Management [phone]

## 2018-08-08 ENCOUNTER — Other Ambulatory Visit: Payer: Self-pay | Admitting: Pharmacy Technician

## 2018-08-08 ENCOUNTER — Other Ambulatory Visit: Payer: Self-pay | Admitting: Pharmacist

## 2018-08-08 NOTE — Patient Outreach (Signed)
[facility]) [place]   08/08/2018  Jon [name] October 22, 1950 [id number]  Reason for referral: Medication Assistance  Referral source: Jon [name] insurance: [facility]  PMHx includes but not limited to:  Chronic ischemic heart disease (severe LVH), HTN, HLD, CKD-IV s/p renal transplant '15 on immunosuppression, hyperthyroidism, hx CVA, T2DM, GERD, pulmonary HTN, AAA  Outreach:  Successful telephone call with Jon [name].  HIPAA identifiers verified.   Subjective:  Patient reports he is not able to afford Tradjenta any longer and has been relying on samples from PCP for the last month.  He will run out next week.  He states his insulins are also very expensive right now.  He reports his spouse helps manage his medications.    Objective: The 10-year ASCVD risk score Jon Bussing DC Jr., et al., 2013) is: 37.7%   Values used to calculate the score:     Age: 68 years     Sex: Male     Is Non-Hispanic African American: No     Diabetic: Yes     Tobacco smoker: No     Systolic Blood Pressure: 144 mmHg     Is BP treated: Yes     HDL Cholesterol: 36 MG/DL     Total Cholesterol: 131 MG/DL  Lab Results  Component Value Date   CREATININE 1.60 (H) 07/26/2017   CREATININE 2.57 (H) 06/18/2017   CREATININE 2.60 (H) 06/05/2017    Lab Results  Component Value Date   HGBA1C 7.0 (H) 06/04/2017    Lipid Panel     Component Value Date/Time   CHOL 137 01/02/2017 0343   TRIG 81 01/02/2017 0343   HDL 38 (L) 01/02/2017 0343   CHOLHDL 3.6 01/02/2017 0343   VLDL 16 01/02/2017 0343   LDLCALC 83 01/02/2017 0343    BP Readings from Last 3 Encounters:  04/24/18 (!) 157/58  01/29/18 120/76  11/29/17 (!) 142/70    Allergies  Allergen Reactions  . Lisinopril Other (See Comments)    Increased potassium level   . Meloxicam Nausea And Vomiting  . Victoza [Liraglutide] Diarrhea, Nausea And Vomiting and Swelling     Medications Reviewed Today    Reviewed by Jon Liter, MD (Physician) on 04/24/18 at 1019  Med List Status: <None>  Medication Order Taking? Sig Documenting Provider Last Dose Status Informant  aspirin EC 81 MG tablet [id number] Yes Take 81 mg by mouth daily.  [provider] Taking Active Spouse/Significant Other  atorvastatin (LIPITOR) 40 MG tablet [id number] Yes Take 40 mg by mouth daily.  [provider] Taking Active Spouse/Significant Other  calcitRIOL (ROCALTROL) 0.25 MCG capsule [id number] Yes Take 0.25 mcg by mouth See admin instructions. Take 1 capsule (0.25 mcg) by mouth five times weekly - Monday thru Friday [provider] Taking Active Spouse/Significant Other  furosemide (LASIX) 40 MG tablet [id number] Yes Take 40 mg by mouth daily. [provider] Taking Active   gabapentin (NEURONTIN) 300 MG capsule [id number] Yes Take 300 mg by mouth 2 (two) times daily. [provider] Taking Active Spouse/Significant Other  HYDROcodone-acetaminophen (NORCO) 10-325 MG tablet [id number] Yes Take 1-2 tablets by mouth every 6 (six) hours as needed (pain).  [provider] Taking Active Spouse/Significant Other  insulin glargine (LANTUS) 100 unit/mL SOPN [id number] Yes Inject 24 Units into the skin at bedtime.  [provider] Taking Active Spouse/Significant Other  insulin lispro (HUMALOG KWIKPEN) 100 UNIT/ML KiwkPen [id number] Yes Inject 5-15  Units into the skin See admin instructions. Inject 5 units subcutaneously three times daily before meals adjusted per carb count - add 2 units for every 25 points [provider] Taking Active Spouse/Significant Other           Med Note Jon [name]   Wed Jul 26, 2017  5:04 PM)    methimazole (TAPAZOLE) 5 MG tablet [id number] Yes Take 5 mg by mouth daily. [provider] Taking Active Spouse/Significant Other  Multiple Vitamin (MULTIVITAMIN WITH MINERALS) TABS tablet  [id number] Yes Take 1 tablet by mouth daily. [provider] Taking Active Spouse/Significant Other  mycophenolate (MYFORTIC) 360 MG TBEC EC tablet [id number] Yes Take 360 mg by mouth 2 (two) times daily. [provider] Taking Active Spouse/Significant Other  pantoprazole (PROTONIX) 40 MG tablet [id number] Yes Take 1 tablet (40 mg total) by mouth daily. Jon Denmark, MD Taking Active   predniSONE (DELTASONE) 5 MG tablet [id number] Yes Take 5 mg by mouth daily with breakfast. [provider] Taking Active Spouse/Significant Other  sulfamethoxazole-trimethoprim (BACTRIM,SEPTRA) 400-80 MG tablet [id number] Yes Take 1 tablet by mouth 2 (two) times daily.  [provider] Taking Active Spouse/Significant Other  tacrolimus (PROGRAF) 1 MG capsule [id number] Yes Take 2 mg by mouth 2 (two) times daily. [provider] Taking Active Spouse/Significant Other  tamsulosin (FLOMAX) 0.4 MG CAPS capsule [id number] Yes Take 0.4 mg by mouth daily after breakfast. [provider] Taking Active Spouse/Significant Other           Med Note Nicki [name], Jon A   Sun Jun 04, 2017  2:44 PM)    TRADJENTA 5 MG TABS tablet [id number]  Take 5 mg by mouth daily. [provider]  Active   vitamin B-12 (CYANOCOBALAMIN) 1000 MCG tablet [id number] Yes Take 1,000 mcg by mouth 2 (two) times daily.  [provider] Taking Active Spouse/Significant Other          Assessment: Drugs sorted by system:  Neurologic/Psychologic:gabapentin  Hematologic: aspirin 33m  Cardiovascular: atorvastatin, furosemide  Gastrointestinal: pantoprazole  Endocrine:insulin glargine, insulin lispro, linagliptin, methimazole, prednisone  Renal: calcitriol, mycophenolate, tacrolimus  Pain:hydrocodone-APAP  Infectious Diseases: sulfamethoxazole-trimethoprim  Genitourinary:tamsulosin  Vitamins/Minerals/Supplements: MVI, vitamin B12   Medication Assistance Findings:  Medication  assistance needs identified: Linagliptin, Lantus, Humalog  Extra Help:  Not eligible for Extra Help Low Income Subsidy based on reported income and assets  Patient Assistance Programs: Tradjenta made by BGlendorarequirement met: Yes o Out-of-pocket prescription expenditure met:   Not Applicable - Patient has met application requirements to apply for this program.  - Reviewed program requirements with patient.    Humalog made by ELuxemburgrequirement met: Yes o Out-of-pocket prescription expenditure met:   Not Applicable - Patient has met application requirements to apply for this program.  - Reviewed program requirements with patient.    Lantus made by SAlbertson'so Income requirement met: Yes o Out-of-pocket prescription expenditure met:   Yes - Patient has met application requirements to apply for this program.  - Reviewed program requirements with patient.     Additional medication assistance options reviewed with patient as warranted:  Insurance OTC catalogue  Plan: . I will route patient assistance letter to TJupiter Inlet Colonytechnician who will coordinate patient assistance program application process for medications listed above.  [facility] will assist with obtaining all required documents from both patient and provider(s) and submit application(s) once completed.    CRalene Bathe PharmD, BCPS Clinical Pharmacist  [place] [phone]

## 2018-08-08 NOTE — Patient Outreach (Signed)
[place] [facility]) Care Management  08/08/2018  ADE STMARIE [birth year] [id number]                          Medication Assistance Referral  Referral From: [place]  Medication/Company: Lady [place] / B-I Patient application portion:  Mailed Provider application portion: Faxed  to Dr. Lin Landsman  Medication/Company: Humalog / [employer] Patient application portion:  Mailed Provider application portion: Faxed  to Dr. Lin Landsman  Medication/Company: Lantus / [employer] Patient application portion:  Mailed Provider application portion: Faxed  to Dr. Lin Landsman    Follow up:  Will follow up with patient in 7-10 business days to confirm application(s) have been received.  Maud Deed Chana Bode [place] Certified Pharmacy Technician [place] Management Direct [phone]

## 2018-08-09 DIAGNOSIS — G4733 Obstructive sleep apnea (adult) (pediatric): Secondary | ICD-10-CM | POA: Diagnosis not present

## 2018-08-10 ENCOUNTER — Ambulatory Visit: Payer: Medicare Other | Admitting: Pharmacist

## 2018-08-10 DIAGNOSIS — G4733 Obstructive sleep apnea (adult) (pediatric): Secondary | ICD-10-CM | POA: Diagnosis not present

## 2018-08-10 DIAGNOSIS — I10 Essential (primary) hypertension: Secondary | ICD-10-CM | POA: Diagnosis not present

## 2018-08-10 DIAGNOSIS — E785 Hyperlipidemia, unspecified: Secondary | ICD-10-CM | POA: Diagnosis not present

## 2018-08-20 DIAGNOSIS — I129 Hypertensive chronic kidney disease with stage 1 through stage 4 chronic kidney disease, or unspecified chronic kidney disease: Secondary | ICD-10-CM | POA: Diagnosis not present

## 2018-08-20 DIAGNOSIS — D509 Iron deficiency anemia, unspecified: Secondary | ICD-10-CM | POA: Diagnosis not present

## 2018-08-20 DIAGNOSIS — N189 Chronic kidney disease, unspecified: Secondary | ICD-10-CM | POA: Diagnosis not present

## 2018-08-24 ENCOUNTER — Other Ambulatory Visit: Payer: Self-pay | Admitting: Pharmacy Technician

## 2018-08-24 NOTE — Patient Outreach (Signed)
[facility]) Care Management  08/24/2018  Jon [name] August 23, 1950 [id number]   Received patient portion(s) of patient assistance application for Tradjenta, Humalog and Lantus Solostar.   Re-faxed provider portion of application to Dr. Lin Landsman for completion.  Will submit to companies once all documents have been received.  Maud Deed Chana Bode [place] Certified Pharmacy Technician [place] Management Direct [phone]

## 2018-08-29 ENCOUNTER — Other Ambulatory Visit: Payer: Self-pay | Admitting: Pharmacy Technician

## 2018-08-29 DIAGNOSIS — N2581 Secondary hyperparathyroidism of renal origin: Secondary | ICD-10-CM | POA: Diagnosis not present

## 2018-08-29 DIAGNOSIS — E1129 Type 2 diabetes mellitus with other diabetic kidney complication: Secondary | ICD-10-CM | POA: Diagnosis not present

## 2018-08-29 DIAGNOSIS — I10 Essential (primary) hypertension: Secondary | ICD-10-CM | POA: Diagnosis not present

## 2018-08-29 DIAGNOSIS — R809 Proteinuria, unspecified: Secondary | ICD-10-CM | POA: Diagnosis not present

## 2018-08-29 DIAGNOSIS — Z940 Kidney transplant status: Secondary | ICD-10-CM | POA: Diagnosis not present

## 2018-08-29 DIAGNOSIS — N390 Urinary tract infection, site not specified: Secondary | ICD-10-CM | POA: Diagnosis not present

## 2018-08-29 NOTE — Patient Outreach (Signed)
[facility]) Care Management  08/29/2018  KEONTAE LEVINGSTON 1950/07/01 [id number]   Received patient portion(s) of patient assistance application for Lantus, Humalog and Tradjenta. Faxed completed application and required documents into [employer] and [employer].  Dr. Janace Aris office faxed in [place] letter of approval along with other documents. Office had already applied thru [place].  Will follow up with company in 7-10 business days to check status of application.  Maud Deed Chana Bode [place] Certified Pharmacy Technician [place] Management Direct Dial:[phone]

## 2018-08-30 DIAGNOSIS — Z79899 Other long term (current) drug therapy: Secondary | ICD-10-CM | POA: Diagnosis not present

## 2018-08-30 DIAGNOSIS — E0590 Thyrotoxicosis, unspecified without thyrotoxic crisis or storm: Secondary | ICD-10-CM | POA: Diagnosis not present

## 2018-09-05 ENCOUNTER — Other Ambulatory Visit: Payer: Self-pay | Admitting: Pharmacy Technician

## 2018-09-05 DIAGNOSIS — I509 Heart failure, unspecified: Secondary | ICD-10-CM | POA: Diagnosis not present

## 2018-09-05 NOTE — Patient Outreach (Addendum)
[place] [facility]) Care Management  09/05/2018  Jon [name] 1950/01/18 [id number]     Follow up call placed to [place] regarding patient assistance application(s) for Lantus Solostar , Sonia Side confirms patient has been approved as of 8/25 until 01/10/19. Medication to arrive at providers office in 7-10 business days.   Follow up call placed to [facility]  regarding patient assistance application(s) for Humalog , Verdene Lennert confirms patient has been approved as of 8/21 until 01/10/19. Medication to be delivered to patients home on 9/1.  Follow up:  Will follow up with patient in 10- 14 business days to confirm medications have been received.   Maud Deed Chana Bode [place] Certified Pharmacy Technician [place] Management Direct [phone]

## 2018-09-09 DIAGNOSIS — G4733 Obstructive sleep apnea (adult) (pediatric): Secondary | ICD-10-CM | POA: Diagnosis not present

## 2018-09-10 DIAGNOSIS — D519 Vitamin B12 deficiency anemia, unspecified: Secondary | ICD-10-CM | POA: Diagnosis not present

## 2018-09-10 DIAGNOSIS — E1121 Type 2 diabetes mellitus with diabetic nephropathy: Secondary | ICD-10-CM | POA: Diagnosis not present

## 2018-09-10 DIAGNOSIS — E785 Hyperlipidemia, unspecified: Secondary | ICD-10-CM | POA: Diagnosis not present

## 2018-09-12 DIAGNOSIS — M47812 Spondylosis without myelopathy or radiculopathy, cervical region: Secondary | ICD-10-CM | POA: Diagnosis not present

## 2018-09-12 DIAGNOSIS — I10 Essential (primary) hypertension: Secondary | ICD-10-CM | POA: Diagnosis not present

## 2018-09-12 DIAGNOSIS — M961 Postlaminectomy syndrome, not elsewhere classified: Secondary | ICD-10-CM | POA: Diagnosis not present

## 2018-09-20 DIAGNOSIS — E1121 Type 2 diabetes mellitus with diabetic nephropathy: Secondary | ICD-10-CM | POA: Diagnosis not present

## 2018-09-21 ENCOUNTER — Other Ambulatory Visit: Payer: Self-pay | Admitting: Pharmacy Technician

## 2018-09-21 NOTE — Patient Outreach (Addendum)
[facility]) Care Management  09/21/2018  BARI HANDSHOE 05-Nov-1950 [id number]    Unsuccessful call placed to patient regarding patient assistance medication delivery of Lantus Solostar and Humalog, HIPAA compliant voicemail left.   Follow up:  Will make 2nd call attempt in 3-5 business days if call has not been returned  ADDENDUM 2:20pm  Return call from patient, HIPAA identifiers verified. Mr. Shellhammer confirms that he received his Humalog from [facility]. He states that he has not received nor been contacted by providers office for Lantus Solostar thru [employer] patient assistance. He states he will contact Dr. Welford Roche office to see if medication has come in.  Outreach call place to [employer] patient assistance, Tillie Rung confirms 1 box of 5 Lantus Solostar pens was delivered to Dr. Janace Aris office on 8/27 @ 11:08am.  Return call to patient to inform of above details, he states he will follow up with Dr. Janace Aris office. Requested he contact me with any issues.  Will follow up with patient in 2-3 business days to confirm Lantus has been received.  Maud Deed Chana Bode [place] Certified Pharmacy Technician [place] Management Direct Dial:[phone]

## 2018-09-27 DIAGNOSIS — E7800 Pure hypercholesterolemia, unspecified: Secondary | ICD-10-CM | POA: Diagnosis not present

## 2018-09-27 DIAGNOSIS — Z0000 Encounter for general adult medical examination without abnormal findings: Secondary | ICD-10-CM | POA: Diagnosis not present

## 2018-09-27 DIAGNOSIS — E1121 Type 2 diabetes mellitus with diabetic nephropathy: Secondary | ICD-10-CM | POA: Diagnosis not present

## 2018-09-27 DIAGNOSIS — Z23 Encounter for immunization: Secondary | ICD-10-CM | POA: Diagnosis not present

## 2018-10-06 DIAGNOSIS — I509 Heart failure, unspecified: Secondary | ICD-10-CM | POA: Diagnosis not present

## 2018-10-08 ENCOUNTER — Other Ambulatory Visit: Payer: Self-pay | Admitting: Pharmacist

## 2018-10-08 ENCOUNTER — Other Ambulatory Visit: Payer: Self-pay | Admitting: Pharmacy Technician

## 2018-10-08 DIAGNOSIS — E119 Type 2 diabetes mellitus without complications: Secondary | ICD-10-CM | POA: Diagnosis not present

## 2018-10-08 DIAGNOSIS — Z4822 Encounter for aftercare following kidney transplant: Secondary | ICD-10-CM | POA: Diagnosis not present

## 2018-10-08 DIAGNOSIS — Z79899 Other long term (current) drug therapy: Secondary | ICD-10-CM | POA: Diagnosis not present

## 2018-10-08 DIAGNOSIS — I10 Essential (primary) hypertension: Secondary | ICD-10-CM | POA: Diagnosis not present

## 2018-10-08 DIAGNOSIS — Z794 Long term (current) use of insulin: Secondary | ICD-10-CM | POA: Diagnosis not present

## 2018-10-08 DIAGNOSIS — Z940 Kidney transplant status: Secondary | ICD-10-CM | POA: Diagnosis not present

## 2018-10-08 NOTE — Patient Outreach (Signed)
[place] [facility]) Care Management  10/08/2018  Jon [name] 1950/07/03 [id number]    Successful call placed to patient regarding patient assistance medication delivery of Lantus Solostar, HIPAA identifiers verified. [name] confirms that his providers office did have the Lantus Solostar at the office. Reviewed with patient on obtaining refills if needed.  Follow up:  Will route note to [place] for case closure  Maud Deed. Chana Bode [place] Certified Pharmacy Technician [place] Management Direct [phone]

## 2018-10-08 NOTE — Patient Outreach (Signed)
[place]) Care Management [place]  10/08/2018  TODDRICK SANNA 1950-04-09 [id number]  Patient has been approved for Lantus, Humalog, and Tradjenta patient assistance program(s).  Patient has been instructed on how to order refills and renewal process for 2021.    Plan: [place] case is being closed due to the following reasons: -Goals of care have been met. -I have provided my contact information if patient or family needs to reach out to me in the future.  -Thank you for allowing [facility] pharmacy to be involved in this patient's care.    Ralene Bathe, PharmD, [place] [phone]

## 2018-10-10 DIAGNOSIS — D519 Vitamin B12 deficiency anemia, unspecified: Secondary | ICD-10-CM | POA: Diagnosis not present

## 2018-10-10 DIAGNOSIS — I10 Essential (primary) hypertension: Secondary | ICD-10-CM | POA: Diagnosis not present

## 2018-10-10 DIAGNOSIS — G4733 Obstructive sleep apnea (adult) (pediatric): Secondary | ICD-10-CM | POA: Diagnosis not present

## 2018-10-10 DIAGNOSIS — E785 Hyperlipidemia, unspecified: Secondary | ICD-10-CM | POA: Diagnosis not present

## 2018-10-10 DIAGNOSIS — E1121 Type 2 diabetes mellitus with diabetic nephropathy: Secondary | ICD-10-CM | POA: Diagnosis not present

## 2018-10-15 DIAGNOSIS — Z940 Kidney transplant status: Secondary | ICD-10-CM | POA: Diagnosis not present

## 2018-10-15 DIAGNOSIS — Z4822 Encounter for aftercare following kidney transplant: Secondary | ICD-10-CM | POA: Diagnosis not present

## 2018-10-24 ENCOUNTER — Other Ambulatory Visit: Payer: Self-pay

## 2018-10-24 ENCOUNTER — Encounter: Payer: Self-pay | Admitting: Cardiology

## 2018-10-24 ENCOUNTER — Ambulatory Visit (INDEPENDENT_AMBULATORY_CARE_PROVIDER_SITE_OTHER): Payer: Medicare Other | Admitting: Cardiology

## 2018-10-24 VITALS — BP 132/70 | HR 70 | Ht 71.0 in | Wt 251.2 lb

## 2018-10-24 DIAGNOSIS — I712 Thoracic aortic aneurysm, without rupture: Secondary | ICD-10-CM | POA: Diagnosis not present

## 2018-10-24 DIAGNOSIS — I7121 Aneurysm of the ascending aorta, without rupture: Secondary | ICD-10-CM

## 2018-10-24 DIAGNOSIS — I10 Essential (primary) hypertension: Secondary | ICD-10-CM | POA: Diagnosis not present

## 2018-10-24 DIAGNOSIS — I272 Pulmonary hypertension, unspecified: Secondary | ICD-10-CM

## 2018-10-24 DIAGNOSIS — E0800 Diabetes mellitus due to underlying condition with hyperosmolarity without nonketotic hyperglycemic-hyperosmolar coma (NKHHC): Secondary | ICD-10-CM

## 2018-10-24 NOTE — Progress Notes (Signed)
Cardiology Office Note:    Date:  10/24/2018   ID:  Jon [name], DOB 09/11/1950, MRN [id number]  PCP:  [name], MD  Cardiologist:  Jon Campus, MD    Referring MD: [name], MD   Chief Complaint  Patient presents with  . Follow-up  Doing very well  History of Present Illness:    Jon [name] is a 68 y.o. male who was originally referred to [place] because of pulmonary hypertension.  He is a kidney transplant recipient.  He also seen this many years ago seems to be doing well last creatinine 2.2.  He also got hypertension diabetes.  Overall doing very well denies having any chest pain tightness pressure burning in the chest he said that he feels much better than 6 months ago.  He was able to work in the garden and have no difficulty doing it.  In terms of his pulmonary hypertension last echocardiogram that was done in May did not show any elevation of pulmonary pressure.  Past Medical History:  Diagnosis Date  . Abnormal gait   . Anemia of chronic disease   . Arthritis    back, hands   . Chronic back pain    radiculopathy and stenosis  . Chronic ischemic heart disease    Severe LVH  . Chronic kidney disease    Jon [name], awaiting Transplant   . Congestive heart failure (CHF) ([place])   . CVA (cerebral infarction) 03/2013  . Diabetes mellitus     Lantus nightly ;type 2  . GERD (gastroesophageal reflux disease)   . H/O hiatal hernia   . History of colon polyps   . Hx of cardiovascular stress test    a.  Lexiscan Myoview (05/2013):  No ischemia, EF 53%; Normal Study  . Hyperlipidemia    takes Lovastatin daily  . Hypertension    takes Amlodipine and Catapres daily    Jon [name], Loop Recorder - Jon [name]  . Nocturia   . Obesity   . Peripheral edema    takes Lasix daily  . Sleep apnea    cpap , study in their home, 09/2102- [place]           . Stroke (HCC)    speech, rt arm weakness  . Urinary frequency     Past Surgical History:   Procedure Laterality Date  . AV FISTULA PLACEMENT Left 04/03/2013   Procedure: ARTERIOVENOUS (AV) FISTULA CREATION- LEFT RADIOCEPHALIC VS BRACHIOCEPHALIC;  Surgeon: Jon Elma, MD;  Location: [place];  Service: Vascular;  Laterality: Left;  . AV FISTULA PLACEMENT Left 05/14/2013   Procedure: LEFT BRACHIOCEPHALIC ARTERIOVENOUS (AV) FISTULA CREATION;  Surgeon: Jon Cerrillos Hoyos, MD;  Location: [place];  Service: Vascular;  Laterality: Left;  . BACK SURGERY  12/2012   2003- 1st back surgery & then 2014- fusion  . COLONOSCOPY    . ESOPHAGOGASTRODUODENOSCOPY    . HEMORRHOID SURGERY    . KIDNEY TRANSPLANT  10/01/2013  . left knee surgery    . LOOP RECORDER IMPLANT  03/19/13   MDT LinQ implanted by Jon Rayann Heman for cryptogenic stroke  . LOOP RECORDER IMPLANT N/A 03/19/2013   Procedure: LOOP RECORDER IMPLANT;  Surgeon: Coralyn Mark, MD;  Location: [place] CATH LAB;  Service: Cardiovascular;  Laterality: N/A;  . NEPHRECTOMY TRANSPLANTED ORGAN    . NEUROPLASTY / TRANSPOSITION MEDIAN NERVE AT CARPAL TUNNEL BILATERAL    . right leg surgery     pin in place  .  right wrist surgery    . TEE WITHOUT CARDIOVERSION N/A 03/19/2013   Procedure: TRANSESOPHAGEAL ECHOCARDIOGRAM (TEE);  Surgeon: [name], MD;  Location[facility] ENDOSCOPY;  Service: Cardiovascular;  Laterality: N/A;  . THYROID SURGERY  10/13/2015   Performed at [facility] with surgical pathology revealed consistent with benign follicular nodule ([name] category II)    Current Medications: Current Meds  Medication Sig  . aspirin EC 81 MG tablet Take 81 mg by mouth daily.   [name] atorvastatin (LIPITOR) 40 MG tablet Take 40 mg by mouth daily.   . calcitRIOL (ROCALTROL) 0.25 MCG capsule Take 0.25 mcg by mouth See admin instructions. Take 1 capsule (0.25 mcg) by mouth five times weekly - Monday thru Friday  . furosemide (LASIX) 40 MG tablet Take 40 mg by mouth daily.  [name] gabapentin (NEURONTIN) 300 MG capsule Take 300 mg by mouth 2 (two) times daily.  [name]  HYDROcodone-acetaminophen (NORCO) 10-325 MG tablet Take 1-2 tablets by mouth every 6 (six) hours as needed (pain).   . insulin glargine (LANTUS) 100 unit/mL SOPN Inject 24 Units into the skin at bedtime.   . insulin lispro (HUMALOG KWIKPEN) 100 UNIT/ML KiwkPen Inject 5-15 Units into the skin See admin instructions. Inject 5 units subcutaneously three times daily before meals adjusted per carb count - add 2 units for every 25 points  . methimazole (TAPAZOLE) 5 MG tablet Take 5 mg by mouth daily.  . Multiple Vitamin (MULTIVITAMIN WITH MINERALS) TABS tablet Take 1 tablet by mouth daily.  . mycophenolate (MYFORTIC) 180 MG EC tablet Take 180 mg by mouth. 2 tablets in the am and 1 tablet in the pm  . pantoprazole (PROTONIX) 40 MG tablet Take 1 tablet (40 mg total) by mouth daily.  . predniSONE (DELTASONE) 5 MG tablet Take 5 mg by mouth daily with breakfast.  . sulfamethoxazole-trimethoprim (BACTRIM,SEPTRA) 400-80 MG tablet Take 1 tablet by mouth 3 (three) times a week. M/W/F  . tacrolimus (PROGRAF) 1 MG capsule Take 2 mg by mouth 2 (two) times daily.  . TRADJENTA 5 MG TABS tablet Take 5 mg by mouth daily.  . vitamin B-12 (CYANOCOBALAMIN) 1000 MCG tablet Take 1,000 mcg by mouth 2 (two) times daily.      Allergies:   Lisinopril, Meloxicam, and Victoza [liraglutide]   Social History   Socioeconomic History  . Marital status: Married    Spouse name: Jon [name]  . Number of children: 5  . Years of education: 8th  . Highest education level: Not on file  Occupational History  . Occupation: disabled  Social Needs  . Financial resource strain: Not on file  . Food insecurity    Worry: Not on file    Inability: Not on file  . Transportation needs    Medical: Not on file    Non-medical: Not on file  Tobacco Use  . Smoking status: Former Smoker    Packs/day: 0.50    Years: 40.00    Pack years: 20.00    Types: Cigarettes    Quit date: 12/27/2012    Years since quitting: 5.8  . Smokeless tobacco:  Never Used  Substance and Sexual Activity  . Alcohol use: No    Alcohol/week: 0.0 standard drinks  . Drug use: No  . Sexual activity: Yes  Lifestyle  . Physical activity    Days per week: Not on file    Minutes per session: Not on file  . Stress: Not on file  Relationships  . Social connections    Talks  on phone: Not on file    Gets together: Not on file    Attends religious service: Not on file    Active member of club or organization: Not on file    Attends meetings of clubs or organizations: Not on file    Relationship status: Not on file  Other Topics Concern  . Not on file  Social History Narrative   Patient lives at home with his wife Herbert Pun).  One story home with basement.   Patient is disabled.   Education 8th grade.   Right handed.   Caffeine One cup of coffee daily.   Retired Administrator.   5 children.     Family History: The patient's family history includes Diabetes in his brother; Heart disease in his father; Hyperlipidemia in his father and son; Hypertension in his father and mother; Renal Disease in his father. ROS:   Please see the history of present illness.    All 14 point review of systems negative except as described per history of present illness  EKGs/Labs/Other Studies Reviewed:      Recent Labs: No results found for requested labs within last 8760 hours.  Recent Lipid Panel    Component Value Date/Time   CHOL 137 01/02/2017 0343   TRIG 81 01/02/2017 0343   HDL 38 (L) 01/02/2017 0343   CHOLHDL 3.6 01/02/2017 0343   VLDL 16 01/02/2017 0343   LDLCALC 83 01/02/2017 0343    Physical Exam:    VS:  BP 132/70   Pulse 70   Ht 5\' 11"  (1.803 m)   Wt 251 lb 3.2 oz (113.9 kg)   SpO2 98%   BMI 35.04 kg/m     Wt Readings from Last 3 Encounters:  10/24/18 251 lb 3.2 oz (113.9 kg)  04/24/18 234 lb 8 oz (106.4 kg)  01/29/18 235 lb 12.8 oz (107 kg)     GEN:  Well nourished, well developed in no acute distress HEENT: Normal NECK: No JVD;  No carotid bruits LYMPHATICS: No lymphadenopathy CARDIAC: RRR, no murmurs, no rubs, no gallops RESPIRATORY:  Clear to auscultation without rales, wheezing or rhonchi  ABDOMEN: Soft, non-tender, non-distended MUSCULOSKELETAL:  No edema; No deformity  SKIN: Warm and dry LOWER EXTREMITIES: no swelling NEUROLOGIC:  Alert and oriented x 3 PSYCHIATRIC:  Normal affect   ASSESSMENT:    1. Pulmonary hypertension, unspecified ([place])   2. Essential hypertension   3. Ascending aortic aneurysm ([place])   4. Diabetes mellitus due to underlying condition with hyperosmolarity without coma, without long-term current use of insulin (HCC)    PLAN:    In order of problems listed above:  1. Pulmonary hypertension last echocardiogram did not show any significant elevation of pulmonary pressure.  He is asymptomatic axial we will repeat echocardiogram. 2. Essential hypertension blood pressure well controlled we will continue present management. 3. Ascending aortic aneurysm stable we will continue monitoring. 4. Diabetes followed by 10 medicine team last hemoglobin A1c from 10 September was 6.8.  I congratulated [name] for it and encouraged [name] to keep doing good job.  Status post kidney transplant stable with creatinine 2.2.  His sister was a donor of his kidney   Medication Adjustments/Labs and Tests Ordered: Current medicines are reviewed at length with the patient today.  Concerns regarding medicines are outlined above.  No orders of the defined types were placed in this encounter.  Medication changes: No orders of the defined types were placed in this encounter.   Signed, Herbie Baltimore  Synetta Fail, MD, [facility] 10/24/2018 9:35 AM    [place]

## 2018-10-24 NOTE — Patient Instructions (Addendum)
Medication Instructions:  Your physician recommends that you continue on your current medications as directed. Please refer to the Current Medication list given to you today.  If you need a refill on your cardiac medications before your next appointment, please call your pharmacy.   Lab work: None.  If you have labs (blood work) drawn today and your tests are completely normal, you will receive your results only by: [name] MyChart Message (if you have MyChart) OR . A paper copy in the mail If you have any lab test that is abnormal or we need to change your treatment, we will call you to review the results.  Testing/Procedures: None.   Follow-Up: At [facility], you and your health needs are our priority.  As part of our continuing mission to provide you with exceptional heart care, we have created designated Provider Care Teams.  These Care Teams include your primary Cardiologist (physician) and Advanced Practice Providers (APPs -  Physician Assistants and Nurse Practitioners) who all work together to provide you with the care you need, when you need it. You will need a follow up appointment in 6 months.  Please call our office 2 months in advance to schedule this appointment.  You may see [name], MD or another member of our [place] Provider Team in [place]: Shirlee More, MD . Jyl Heinz, MD  Any Other Special Instructions Will Be Listed Below (If Applicable).

## 2018-10-31 ENCOUNTER — Ambulatory Visit: Payer: Medicare Other | Admitting: Internal Medicine

## 2018-11-06 ENCOUNTER — Other Ambulatory Visit: Payer: Self-pay | Admitting: Gastroenterology

## 2018-11-06 DIAGNOSIS — Z940 Kidney transplant status: Secondary | ICD-10-CM | POA: Diagnosis not present

## 2018-11-06 DIAGNOSIS — Z7689 Persons encountering health services in other specified circumstances: Secondary | ICD-10-CM | POA: Diagnosis not present

## 2018-11-06 DIAGNOSIS — K2960 Other gastritis without bleeding: Secondary | ICD-10-CM

## 2018-11-08 DIAGNOSIS — G4733 Obstructive sleep apnea (adult) (pediatric): Secondary | ICD-10-CM | POA: Diagnosis not present

## 2018-11-09 DIAGNOSIS — E7800 Pure hypercholesterolemia, unspecified: Secondary | ICD-10-CM | POA: Diagnosis not present

## 2018-11-09 DIAGNOSIS — I10 Essential (primary) hypertension: Secondary | ICD-10-CM | POA: Diagnosis not present

## 2018-11-09 DIAGNOSIS — G4733 Obstructive sleep apnea (adult) (pediatric): Secondary | ICD-10-CM | POA: Diagnosis not present

## 2018-11-13 DIAGNOSIS — S61401A Unspecified open wound of right hand, initial encounter: Secondary | ICD-10-CM | POA: Diagnosis not present

## 2018-11-13 DIAGNOSIS — S60221A Contusion of right hand, initial encounter: Secondary | ICD-10-CM | POA: Diagnosis not present

## 2018-11-19 DIAGNOSIS — Z940 Kidney transplant status: Secondary | ICD-10-CM | POA: Diagnosis not present

## 2018-11-19 DIAGNOSIS — I10 Essential (primary) hypertension: Secondary | ICD-10-CM | POA: Diagnosis not present

## 2018-11-19 DIAGNOSIS — R809 Proteinuria, unspecified: Secondary | ICD-10-CM | POA: Diagnosis not present

## 2018-11-19 DIAGNOSIS — E1129 Type 2 diabetes mellitus with other diabetic kidney complication: Secondary | ICD-10-CM | POA: Diagnosis not present

## 2018-11-19 DIAGNOSIS — N2581 Secondary hyperparathyroidism of renal origin: Secondary | ICD-10-CM | POA: Diagnosis not present

## 2018-11-21 DIAGNOSIS — Z23 Encounter for immunization: Secondary | ICD-10-CM | POA: Diagnosis not present

## 2018-11-21 DIAGNOSIS — Z0000 Encounter for general adult medical examination without abnormal findings: Secondary | ICD-10-CM | POA: Diagnosis not present

## 2018-11-27 DIAGNOSIS — M47812 Spondylosis without myelopathy or radiculopathy, cervical region: Secondary | ICD-10-CM | POA: Diagnosis not present

## 2018-11-27 DIAGNOSIS — I10 Essential (primary) hypertension: Secondary | ICD-10-CM

## 2018-11-27 DIAGNOSIS — M961 Postlaminectomy syndrome, not elsewhere classified: Secondary | ICD-10-CM | POA: Diagnosis not present

## 2018-11-27 HISTORY — DX: Essential (primary) hypertension: I10

## 2018-11-29 DIAGNOSIS — R600 Localized edema: Secondary | ICD-10-CM | POA: Diagnosis not present

## 2018-11-29 DIAGNOSIS — M25571 Pain in right ankle and joints of right foot: Secondary | ICD-10-CM | POA: Diagnosis not present

## 2018-11-29 DIAGNOSIS — E11622 Type 2 diabetes mellitus with other skin ulcer: Secondary | ICD-10-CM | POA: Diagnosis not present

## 2018-12-10 DIAGNOSIS — I10 Essential (primary) hypertension: Secondary | ICD-10-CM | POA: Diagnosis not present

## 2018-12-10 DIAGNOSIS — D519 Vitamin B12 deficiency anemia, unspecified: Secondary | ICD-10-CM | POA: Diagnosis not present

## 2018-12-10 DIAGNOSIS — E7800 Pure hypercholesterolemia, unspecified: Secondary | ICD-10-CM | POA: Diagnosis not present

## 2018-12-10 DIAGNOSIS — G4733 Obstructive sleep apnea (adult) (pediatric): Secondary | ICD-10-CM | POA: Diagnosis not present

## 2018-12-14 DIAGNOSIS — E041 Nontoxic single thyroid nodule: Secondary | ICD-10-CM | POA: Diagnosis not present

## 2018-12-20 DIAGNOSIS — R5383 Other fatigue: Secondary | ICD-10-CM | POA: Diagnosis not present

## 2018-12-20 DIAGNOSIS — U071 COVID-19: Secondary | ICD-10-CM | POA: Diagnosis not present

## 2018-12-20 DIAGNOSIS — R5381 Other malaise: Secondary | ICD-10-CM | POA: Diagnosis not present

## 2018-12-20 DIAGNOSIS — R6883 Chills (without fever): Secondary | ICD-10-CM | POA: Diagnosis not present

## 2018-12-27 ENCOUNTER — Encounter (HOSPITAL_COMMUNITY): Payer: Self-pay

## 2018-12-27 ENCOUNTER — Emergency Department (HOSPITAL_COMMUNITY): Payer: Medicare Other

## 2018-12-27 ENCOUNTER — Other Ambulatory Visit: Payer: Self-pay

## 2018-12-27 ENCOUNTER — Inpatient Hospital Stay (HOSPITAL_COMMUNITY): Payer: Medicare Other

## 2018-12-27 ENCOUNTER — Inpatient Hospital Stay (HOSPITAL_COMMUNITY)
Admission: EM | Admit: 2018-12-27 | Discharge: 2018-12-31 | DRG: 698 | Disposition: A | Discharge status: Home / Self Care | Payer: Medicare Other | Attending: Family Medicine | Admitting: Family Medicine

## 2018-12-27 DIAGNOSIS — Y830 Surgical operation with transplant of whole organ as the cause of abnormal reaction of the patient, or of later complication, without mention of misadventure at the time of the procedure: Secondary | ICD-10-CM | POA: Diagnosis present

## 2018-12-27 DIAGNOSIS — Z8673 Personal history of transient ischemic attack (TIA), and cerebral infarction without residual deficits: Secondary | ICD-10-CM | POA: Diagnosis not present

## 2018-12-27 DIAGNOSIS — E1122 Type 2 diabetes mellitus with diabetic chronic kidney disease: Secondary | ICD-10-CM | POA: Diagnosis present

## 2018-12-27 DIAGNOSIS — Z888 Allergy status to other drugs, medicaments and biological substances status: Secondary | ICD-10-CM

## 2018-12-27 DIAGNOSIS — Z7952 Long term (current) use of systemic steroids: Secondary | ICD-10-CM

## 2018-12-27 DIAGNOSIS — I272 Pulmonary hypertension, unspecified: Secondary | ICD-10-CM | POA: Diagnosis not present

## 2018-12-27 DIAGNOSIS — K219 Gastro-esophageal reflux disease without esophagitis: Secondary | ICD-10-CM | POA: Diagnosis not present

## 2018-12-27 DIAGNOSIS — Z940 Kidney transplant status: Secondary | ICD-10-CM | POA: Diagnosis not present

## 2018-12-27 DIAGNOSIS — G8929 Other chronic pain: Secondary | ICD-10-CM | POA: Diagnosis not present

## 2018-12-27 DIAGNOSIS — U071 COVID-19: Secondary | ICD-10-CM | POA: Diagnosis present

## 2018-12-27 DIAGNOSIS — M549 Dorsalgia, unspecified: Secondary | ICD-10-CM | POA: Diagnosis present

## 2018-12-27 DIAGNOSIS — E119 Type 2 diabetes mellitus without complications: Secondary | ICD-10-CM

## 2018-12-27 DIAGNOSIS — M5410 Radiculopathy, site unspecified: Secondary | ICD-10-CM | POA: Diagnosis present

## 2018-12-27 DIAGNOSIS — E1140 Type 2 diabetes mellitus with diabetic neuropathy, unspecified: Secondary | ICD-10-CM | POA: Diagnosis not present

## 2018-12-27 DIAGNOSIS — Z841 Family history of disorders of kidney and ureter: Secondary | ICD-10-CM | POA: Diagnosis not present

## 2018-12-27 DIAGNOSIS — Z794 Long term (current) use of insulin: Secondary | ICD-10-CM

## 2018-12-27 DIAGNOSIS — I259 Chronic ischemic heart disease, unspecified: Secondary | ICD-10-CM | POA: Diagnosis not present

## 2018-12-27 DIAGNOSIS — K449 Diaphragmatic hernia without obstruction or gangrene: Secondary | ICD-10-CM | POA: Diagnosis present

## 2018-12-27 DIAGNOSIS — E860 Dehydration: Secondary | ICD-10-CM | POA: Diagnosis present

## 2018-12-27 DIAGNOSIS — E1165 Type 2 diabetes mellitus with hyperglycemia: Secondary | ICD-10-CM | POA: Diagnosis not present

## 2018-12-27 DIAGNOSIS — Z8249 Family history of ischemic heart disease and other diseases of the circulatory system: Secondary | ICD-10-CM

## 2018-12-27 DIAGNOSIS — N1832 Chronic kidney disease, stage 3b: Secondary | ICD-10-CM | POA: Diagnosis present

## 2018-12-27 DIAGNOSIS — I5032 Chronic diastolic (congestive) heart failure: Secondary | ICD-10-CM | POA: Diagnosis present

## 2018-12-27 DIAGNOSIS — E785 Hyperlipidemia, unspecified: Secondary | ICD-10-CM | POA: Diagnosis present

## 2018-12-27 DIAGNOSIS — Z8349 Family history of other endocrine, nutritional and metabolic diseases: Secondary | ICD-10-CM | POA: Diagnosis not present

## 2018-12-27 DIAGNOSIS — I130 Hypertensive heart and chronic kidney disease with heart failure and stage 1 through stage 4 chronic kidney disease, or unspecified chronic kidney disease: Secondary | ICD-10-CM | POA: Diagnosis present

## 2018-12-27 DIAGNOSIS — E669 Obesity, unspecified: Secondary | ICD-10-CM | POA: Diagnosis present

## 2018-12-27 DIAGNOSIS — N179 Acute kidney failure, unspecified: Secondary | ICD-10-CM | POA: Diagnosis not present

## 2018-12-27 DIAGNOSIS — Z833 Family history of diabetes mellitus: Secondary | ICD-10-CM

## 2018-12-27 DIAGNOSIS — T8619 Other complication of kidney transplant: Principal | ICD-10-CM | POA: Diagnosis present

## 2018-12-27 DIAGNOSIS — Z886 Allergy status to analgesic agent status: Secondary | ICD-10-CM

## 2018-12-27 DIAGNOSIS — A0839 Other viral enteritis: Secondary | ICD-10-CM | POA: Diagnosis present

## 2018-12-27 DIAGNOSIS — G4733 Obstructive sleep apnea (adult) (pediatric): Secondary | ICD-10-CM | POA: Diagnosis present

## 2018-12-27 DIAGNOSIS — Z7982 Long term (current) use of aspirin: Secondary | ICD-10-CM

## 2018-12-27 DIAGNOSIS — I10 Essential (primary) hypertension: Secondary | ICD-10-CM | POA: Diagnosis present

## 2018-12-27 DIAGNOSIS — Z79899 Other long term (current) drug therapy: Secondary | ICD-10-CM

## 2018-12-27 DIAGNOSIS — Z6832 Body mass index (BMI) 32.0-32.9, adult: Secondary | ICD-10-CM

## 2018-12-27 DIAGNOSIS — D696 Thrombocytopenia, unspecified: Secondary | ICD-10-CM | POA: Diagnosis present

## 2018-12-27 DIAGNOSIS — D631 Anemia in chronic kidney disease: Secondary | ICD-10-CM | POA: Diagnosis present

## 2018-12-27 DIAGNOSIS — R0602 Shortness of breath: Secondary | ICD-10-CM | POA: Diagnosis not present

## 2018-12-27 DIAGNOSIS — Z7682 Awaiting organ transplant status: Secondary | ICD-10-CM

## 2018-12-27 DIAGNOSIS — Z87891 Personal history of nicotine dependence: Secondary | ICD-10-CM

## 2018-12-27 HISTORY — DX: Acute kidney failure, unspecified: N17.9

## 2018-12-27 LAB — COMPREHENSIVE METABOLIC PANEL
ALT: 67 U/L — ABNORMAL HIGH (ref 0–44)
AST: 68 U/L — ABNORMAL HIGH (ref 15–41)
Albumin: 3.2 g/dL — ABNORMAL LOW (ref 3.5–5.0)
Alkaline Phosphatase: 56 U/L (ref 38–126)
Anion gap: 9 (ref 5–15)
BUN: 77 mg/dL — ABNORMAL HIGH (ref 8–23)
CO2: 18 mmol/L — ABNORMAL LOW (ref 22–32)
Calcium: 9 mg/dL (ref 8.9–10.3)
Chloride: 111 mmol/L (ref 98–111)
Creatinine, Ser: 3.42 mg/dL — ABNORMAL HIGH (ref 0.61–1.24)
GFR calc Af Amer: 20 mL/min — ABNORMAL LOW (ref >60)
GFR calc non Af Amer: 18 mL/min — ABNORMAL LOW (ref >60)
Glucose, Bld: 209 mg/dL — ABNORMAL HIGH (ref 70–99)
Potassium: 5.6 mmol/L — ABNORMAL HIGH (ref 3.5–5.1)
Sodium: 138 mmol/L (ref 135–145)
Total Bilirubin: 0.4 mg/dL (ref 0.3–1.2)
Total Protein: 6.9 g/dL (ref 6.5–8.1)

## 2018-12-27 LAB — CBC WITH DIFFERENTIAL/PLATELET
Abs Immature Granulocytes: 0.02 10*3/uL (ref 0.00–0.07)
Basophils Absolute: 0 10*3/uL (ref 0.0–0.1)
Basophils Relative: 0 %
Eosinophils Absolute: 0 10*3/uL (ref 0.0–0.5)
Eosinophils Relative: 0 %
HCT: 38.4 % — ABNORMAL LOW (ref 39.0–52.0)
Hemoglobin: 12.8 g/dL — ABNORMAL LOW (ref 13.0–17.0)
Immature Granulocytes: 0 %
Lymphocytes Relative: 4 %
Lymphs Abs: 0.3 10*3/uL — ABNORMAL LOW (ref 0.7–4.0)
MCH: 30.4 pg (ref 26.0–34.0)
MCHC: 33.3 g/dL (ref 30.0–36.0)
MCV: 91.2 fL (ref 80.0–100.0)
Monocytes Absolute: 0.5 10*3/uL (ref 0.1–1.0)
Monocytes Relative: 7 %
Neutro Abs: 5.4 10*3/uL (ref 1.7–7.7)
Neutrophils Relative %: 89 %
Platelets: 108 10*3/uL — ABNORMAL LOW (ref 150–400)
RBC: 4.21 MIL/uL — ABNORMAL LOW (ref 4.22–5.81)
RDW: 13.9 % (ref 11.5–15.5)
WBC: 6.2 10*3/uL (ref 4.0–10.5)
nRBC: 0 % (ref 0.0–0.2)

## 2018-12-27 LAB — TRIGLYCERIDES: Triglycerides: 142 mg/dL (ref <150)

## 2018-12-27 LAB — CBG MONITORING, ED
Glucose-Capillary: 193 mg/dL — ABNORMAL HIGH (ref 70–99)
Glucose-Capillary: 174 mg/dL — ABNORMAL HIGH (ref 70–99)

## 2018-12-27 LAB — LACTIC ACID, PLASMA: Lactic Acid, Venous: 1.1 mmol/L (ref 0.5–1.9)

## 2018-12-27 LAB — PROTIME-INR
INR: 1.1 (ref 0.8–1.2)
Prothrombin Time: 13.9 seconds (ref 11.4–15.2)

## 2018-12-27 LAB — D-DIMER, QUANTITATIVE: D-Dimer, Quant: 1.86 ug/mL-FEU — ABNORMAL HIGH (ref 0.00–0.50)

## 2018-12-27 LAB — LACTATE DEHYDROGENASE: LDH: 270 U/L — ABNORMAL HIGH (ref 98–192)

## 2018-12-27 LAB — PROCALCITONIN: Procalcitonin: 0.22 ng/mL

## 2018-12-27 LAB — FIBRINOGEN: Fibrinogen: 698 mg/dL — ABNORMAL HIGH (ref 210–475)

## 2018-12-27 LAB — C-REACTIVE PROTEIN: CRP: 7 mg/dL — ABNORMAL HIGH (ref <1.0)

## 2018-12-27 LAB — FERRITIN: Ferritin: 585 ng/mL — ABNORMAL HIGH (ref 24–336)

## 2018-12-27 LAB — POC SARS CORONAVIRUS 2 AG -  ED: SARS Coronavirus 2 Ag: POSITIVE — AB

## 2018-12-27 MED ORDER — HYDROCODONE-ACETAMINOPHEN 10-325 MG PO TABS
1.0000 | ORAL_TABLET | Freq: Four times a day (QID) | ORAL | Status: DC | PRN
  Administered 2018-12-29 – 2018-12-31 (×4): 1 via ORAL
  Filled 2018-12-27 (×4): qty 1

## 2018-12-27 MED ORDER — SODIUM CHLORIDE 0.9 % IV BOLUS
500.0000 mL | Freq: Once | INTRAVENOUS | Status: AC
  Administered 2018-12-27: 500 mL via INTRAVENOUS

## 2018-12-27 MED ORDER — ACETAMINOPHEN 500 MG PO TABS
1000.0000 mg | ORAL_TABLET | Freq: Once | ORAL | Status: AC
  Administered 2018-12-27: 1000 mg via ORAL
  Filled 2018-12-27: qty 2

## 2018-12-27 MED ORDER — LABETALOL HCL 100 MG PO TABS
100.0000 mg | ORAL_TABLET | Freq: Two times a day (BID) | ORAL | Status: DC
  Administered 2018-12-27 – 2018-12-31 (×8): 100 mg via ORAL
  Filled 2018-12-27 (×8): qty 1

## 2018-12-27 MED ORDER — ONDANSETRON HCL 4 MG/2ML IJ SOLN
4.0000 mg | Freq: Once | INTRAMUSCULAR | Status: AC
  Administered 2018-12-27: 4 mg via INTRAVENOUS
  Filled 2018-12-27: qty 2

## 2018-12-27 MED ORDER — INSULIN GLARGINE 100 UNITS/ML SOLOSTAR PEN
15.0000 [IU] | PEN_INJECTOR | Freq: Every day | SUBCUTANEOUS | Status: DC
  Filled 2018-12-27: qty 3

## 2018-12-27 MED ORDER — METHIMAZOLE 5 MG PO TABS
2.5000 mg | ORAL_TABLET | Freq: Every day | ORAL | Status: DC
  Administered 2018-12-28 – 2018-12-31 (×4): 2.5 mg via ORAL
  Filled 2018-12-27 (×4): qty 1

## 2018-12-27 MED ORDER — ONDANSETRON HCL 4 MG PO TABS: 4.0000 mg | ORAL_TABLET | Freq: Four times a day (QID) | ORAL | Status: DC | PRN

## 2018-12-27 MED ORDER — INSULIN ASPART 100 UNIT/ML ~~LOC~~ SOLN
0.0000 [IU] | Freq: Three times a day (TID) | SUBCUTANEOUS | Status: DC
  Administered 2018-12-28: 2 [IU] via SUBCUTANEOUS
  Administered 2018-12-28: 3 [IU] via SUBCUTANEOUS
  Administered 2018-12-28: 2 [IU] via SUBCUTANEOUS
  Administered 2018-12-29: 8 [IU] via SUBCUTANEOUS
  Administered 2018-12-29 (×2): 5 [IU] via SUBCUTANEOUS
  Administered 2018-12-30: 3 [IU] via SUBCUTANEOUS
  Administered 2018-12-30: 5 [IU] via SUBCUTANEOUS
  Administered 2018-12-30: 3 [IU] via SUBCUTANEOUS
  Administered 2018-12-31: 4 [IU] via SUBCUTANEOUS
  Administered 2018-12-31: 5 [IU] via SUBCUTANEOUS

## 2018-12-27 MED ORDER — IPRATROPIUM-ALBUTEROL 20-100 MCG/ACT IN AERS
1.0000 | INHALATION_SPRAY | RESPIRATORY_TRACT | Status: DC | PRN
  Filled 2018-12-27 (×2): qty 4

## 2018-12-27 MED ORDER — MYCOPHENOLATE SODIUM 180 MG PO TBEC
180.0000 mg | DELAYED_RELEASE_TABLET | Freq: Two times a day (BID) | ORAL | Status: DC
  Administered 2018-12-28 – 2018-12-31 (×8): 180 mg via ORAL
  Filled 2018-12-27 (×11): qty 1

## 2018-12-27 MED ORDER — INSULIN GLARGINE 100 UNIT/ML ~~LOC~~ SOLN
15.0000 [IU] | Freq: Every day | SUBCUTANEOUS | Status: DC
  Administered 2018-12-28 – 2018-12-29 (×2): 15 [IU] via SUBCUTANEOUS
  Filled 2018-12-27 (×4): qty 0.15

## 2018-12-27 MED ORDER — INSULIN ASPART 100 UNIT/ML ~~LOC~~ SOLN: 0.0000 [IU] | Freq: Every day | SUBCUTANEOUS | Status: DC

## 2018-12-27 MED ORDER — GABAPENTIN 300 MG PO CAPS
300.0000 mg | ORAL_CAPSULE | Freq: Two times a day (BID) | ORAL | Status: DC
  Administered 2018-12-27 – 2018-12-31 (×8): 300 mg via ORAL
  Filled 2018-12-27 (×8): qty 1

## 2018-12-27 MED ORDER — SODIUM CHLORIDE 0.9% FLUSH: 3.0000 mL | Freq: Once | INTRAVENOUS | Status: DC

## 2018-12-27 MED ORDER — TACROLIMUS 1 MG PO CAPS
2.0000 mg | ORAL_CAPSULE | Freq: Two times a day (BID) | ORAL | Status: DC
  Administered 2018-12-28 – 2018-12-31 (×8): 2 mg via ORAL
  Filled 2018-12-27 (×10): qty 2

## 2018-12-27 MED ORDER — ATORVASTATIN CALCIUM 40 MG PO TABS
40.0000 mg | ORAL_TABLET | Freq: Every day | ORAL | Status: DC
  Administered 2018-12-27 – 2018-12-30 (×4): 40 mg via ORAL
  Filled 2018-12-27 (×4): qty 1

## 2018-12-27 MED ORDER — SODIUM CHLORIDE 0.9 % IV SOLN: INTRAVENOUS | Status: DC

## 2018-12-27 MED ORDER — PREDNISONE 5 MG PO TABS
5.0000 mg | ORAL_TABLET | Freq: Every day | ORAL | Status: DC
  Administered 2018-12-28: 5 mg via ORAL
  Filled 2018-12-27 (×2): qty 1

## 2018-12-27 MED ORDER — ONDANSETRON HCL 4 MG/2ML IJ SOLN
4.0000 mg | Freq: Four times a day (QID) | INTRAMUSCULAR | Status: DC | PRN
  Administered 2018-12-30: 4 mg via INTRAVENOUS
  Filled 2018-12-27: qty 2

## 2018-12-27 MED ORDER — ACETAMINOPHEN 325 MG PO TABS: 650.0000 mg | ORAL_TABLET | Freq: Four times a day (QID) | ORAL | Status: DC | PRN

## 2018-12-27 MED ORDER — HEPARIN SODIUM (PORCINE) 5000 UNIT/ML IJ SOLN
5000.0000 [IU] | Freq: Three times a day (TID) | INTRAMUSCULAR | Status: DC
  Administered 2018-12-27 – 2018-12-31 (×12): 5000 [IU] via SUBCUTANEOUS
  Filled 2018-12-27 (×12): qty 1

## 2018-12-27 MED ORDER — TAMSULOSIN HCL 0.4 MG PO CAPS
0.4000 mg | ORAL_CAPSULE | Freq: Every day | ORAL | Status: DC
  Administered 2018-12-27 – 2018-12-30 (×4): 0.4 mg via ORAL
  Filled 2018-12-27 (×4): qty 1

## 2018-12-27 MED ORDER — ASPIRIN EC 81 MG PO TBEC
81.0000 mg | DELAYED_RELEASE_TABLET | Freq: Every day | ORAL | Status: DC
  Administered 2018-12-28 – 2018-12-31 (×4): 81 mg via ORAL
  Filled 2018-12-27 (×4): qty 1

## 2018-12-27 NOTE — H&P (Signed)
History and Physical    Jon [name]:[id number] DOB: October 30, 1950 DOA: 12/27/2018  PCP: [name], MD   Patient coming from: Home  I have personally briefly reviewed patient's old medical records in [place]  Chief Complaint: Not feeling well  HPI: Jon [name] is a 68 y.o. male with medical history significant of unspecified CVA, chronic kidney disease stage V (has a left upper extremity AV fistula but has never required dialysis) status post renal transplant, hypertension, diabetes mellitus type 2, hyperlipidemia, chronic back pain,?  Chronic diastolic heart failure, obstructive sleep apnea on CPAP at night presented today with complaints of not feeling well.  Patient states that he was diagnosed Covid positive 8 days ago and since then he has not felt well.  He has been having mostly watery diarrhea which has gotten worse along with decreased appetite and some nausea.  Patient denies any vomiting.  He also feels very weak and tired with myalgia but denies any worsening abdominal pain.  Patient does not complain of worsening cough or shortness of breath however he felt a little short of breath this morning.  He denies any rash, sick contacts, recent travel, loss of consciousness, seizures, chest pain, palpitations.  He lives with his wife at home who currently does not have any symptoms of Covid.  ED Course: He was found to have creatinine of 3.42.  COVID-19 test was positive.  Chest x-ray was negative.  CRP was 7.  He was started on IV fluids.  Hospitalist service was called to evaluate the patient.  Review of Systems: As per HPI otherwise all other systems were reviewed and are negative.  Past Medical History:  Diagnosis Date  . Abnormal gait   . Anemia of chronic disease   . Arthritis    back, hands   . Chronic back pain    radiculopathy and stenosis  . Chronic ischemic heart disease    Severe LVH  . Chronic kidney disease    [name], awaiting Transplant    . Congestive heart failure (CHF) ([facility])   . CVA (cerebral infarction) 03/2013  . Diabetes mellitus     Lantus nightly ;type 2  . GERD (gastroesophageal reflux disease)   . H/O hiatal hernia   . History of colon polyps   . Hx of cardiovascular stress test    a.  Lexiscan Myoview (05/2013):  No ischemia, EF 53%; Normal Study  . Hyperlipidemia    takes Lovastatin daily  . Hypertension    takes Amlodipine and Catapres daily    dr Forrestine Him, Loop Recorder - [facility]  . Nocturia   . Obesity   . Peripheral edema    takes Lasix daily  . Sleep apnea    cpap , study in their home, 09/2102- [facility]           . Stroke ([facility])    speech, rt arm weakness  . Urinary frequency     Past Surgical History:  Procedure Laterality Date  . AV FISTULA PLACEMENT Left 04/03/2013   Procedure: ARTERIOVENOUS (AV) FISTULA CREATION- LEFT RADIOCEPHALIC VS BRACHIOCEPHALIC;  Surgeon: Conrad Conway, MD;  Location: [facility];  Service: Vascular;  Laterality: Left;  . AV FISTULA PLACEMENT Left 05/14/2013   Procedure: LEFT BRACHIOCEPHALIC ARTERIOVENOUS (AV) FISTULA CREATION;  Surgeon: Conrad Beauregard, MD;  Location: [facility];  Service: Vascular;  Laterality: Left;  . BACK SURGERY  12/2012   2003- 1st back surgery & then 2014- fusion  . COLONOSCOPY    .  ESOPHAGOGASTRODUODENOSCOPY    . HEMORRHOID SURGERY    . KIDNEY TRANSPLANT  10/01/2013  . left knee surgery    . LOOP RECORDER IMPLANT  03/19/13   MDT LinQ implanted by Dr Rayann Heman for cryptogenic stroke  . LOOP RECORDER IMPLANT N/A 03/19/2013   Procedure: LOOP RECORDER IMPLANT;  Surgeon: Coralyn Mark, MD;  Location: [facility] CATH LAB;  Service: Cardiovascular;  Laterality: N/A;  . NEPHRECTOMY TRANSPLANTED ORGAN    . NEUROPLASTY / TRANSPOSITION MEDIAN NERVE AT CARPAL TUNNEL BILATERAL    . right leg surgery     pin in place  . right wrist surgery    . TEE WITHOUT CARDIOVERSION N/A 03/19/2013   Procedure: TRANSESOPHAGEAL ECHOCARDIOGRAM (TEE);  Surgeon: [name], MD;   Location: [facility] ENDOSCOPY;  Service: Cardiovascular;  Laterality: N/A;  . THYROID SURGERY  10/13/2015   Performed at [facility] with surgical pathology revealed consistent with benign follicular nodule ([name] category II)   Social history  reports that he quit smoking about 6 years ago. His smoking use included cigarettes. He has a 20.00 pack-year smoking history. He has never used smokeless tobacco. He reports that he does not drink alcohol or use drugs.  Lives at home with wife  Allergies  Allergen Reactions  . Lisinopril Other (See Comments)    Increased potassium level   . Meloxicam Nausea And Vomiting  . Victoza [Liraglutide] Diarrhea, Nausea And Vomiting and Swelling    Family History  Problem Relation Age of Onset  . Hypertension Mother   . Hyperlipidemia Father   . Hypertension Father   . Heart disease Father        before age 27  . Renal Disease Father   . Diabetes Brother   . Hyperlipidemia Son     Prior to Admission medications   Medication Sig Start Date End Date Taking? Authorizing Provider  aspirin EC 81 MG tablet Take 81 mg by mouth daily.    Yes [provider]  atorvastatin (LIPITOR) 40 MG tablet Take 40 mg by mouth daily.    Yes [provider]  calcitRIOL (ROCALTROL) 0.25 MCG capsule Take 0.25 mcg by mouth See admin instructions. Take 1 capsule (0.25 mcg) by mouth five times weekly - Monday thru Friday   Yes [provider]  furosemide (LASIX) 40 MG tablet Take 40 mg by mouth daily.   Yes [provider]  gabapentin (NEURONTIN) 300 MG capsule Take 300 mg by mouth 2 (two) times daily.   Yes [provider]  HYDROcodone-acetaminophen (NORCO) 10-325 MG tablet Take 1-2 tablets by mouth every 6 (six) hours.  12/26/16  Yes [provider]  insulin glargine (LANTUS) 100 unit/mL SOPN Inject 24 Units into the skin at bedtime.    Yes [provider]  insulin lispro (HUMALOG KWIKPEN) 100 UNIT/ML KiwkPen Inject 5-15  Units into the skin See admin instructions. Inject 5 units subcutaneously three times daily before meals adjusted per carb count - add 2 units for every 25 points   Yes [provider]  labetalol (NORMODYNE) 100 MG tablet Take 100 mg by mouth 2 (two) times daily.   Yes [provider]  methimazole (TAPAZOLE) 5 MG tablet Take 2.5 mg by mouth daily.  06/29/17  Yes [provider]  Multiple Vitamin (MULTIVITAMIN WITH MINERALS) TABS tablet Take 1 tablet by mouth daily.   Yes [provider]  Multiple Vitamin (STRESS B PO) Take 1 tablet by mouth daily.   Yes [provider]  mycophenolate (  MYFORTIC) 180 MG EC tablet Take 180 mg by mouth 2 (two) times daily.    Yes [provider]  pantoprazole (PROTONIX) 40 MG tablet Take 1 tablet (40 mg total) by mouth daily. 10/09/17  Yes Jackquline Denmark, MD  predniSONE (DELTASONE) 5 MG tablet Take 5 mg by mouth daily with breakfast.   Yes [provider]  [facility] Wort 300 MG TABS Take 300 mg by mouth 3 (three) times daily.   Yes [provider]  sulfamethoxazole-trimethoprim (BACTRIM,SEPTRA) 400-80 MG tablet Take 1 tablet by mouth 3 (three) times a week. M/W/F   Yes [provider]  tacrolimus (PROGRAF) 1 MG capsule Take 2 mg by mouth 2 (two) times daily. 06/28/17  Yes [provider]  tamsulosin (FLOMAX) 0.4 MG CAPS capsule Take 0.4 mg by mouth at bedtime.   Yes [provider]  TRADJENTA 5 MG TABS tablet Take 5 mg by mouth daily. 04/17/18  Yes [provider]  vitamin B-12 (CYANOCOBALAMIN) 1000 MCG tablet Take 1,000 mcg by mouth 2 (two) times daily.    Yes [provider]    Physical Exam: Vitals:   12/27/18 1422 12/27/18 1430 12/27/18 1516 12/27/18 1517  BP: (!) 113/53 (!) 102/46  122/66  Pulse:  84 81   Resp:      Temp:      TempSrc:      SpO2:  98% 98%   Weight:      Height:        Constitutional: Looks very deconditioned and chronically ill.   Does not appear to be in any distress. Vitals:   12/27/18 1422 12/27/18 1430 12/27/18 1516 12/27/18 1517  BP: (!) 113/53 (!) 102/46  122/66  Pulse:  84 81   Resp:      Temp:      TempSrc:      SpO2:  98% 98%   Weight:      Height:       Eyes: PERRL, lids and conjunctivae normal ENMT: Mucous membranes are dry.  Posterior pharynx clear of any exudate or lesions. Neck: normal, supple, no masses, no thyromegaly Respiratory: bilateral decreased breath sounds at bases, no wheezing.  Normal respiratory effort. No accessory muscle use.  Cardiovascular: S1 S2 positive, rate controlled. No extremity edema. 2+ pedal pulses.  Abdomen: no tenderness, no masses palpated. No hepatosplenomegaly. Bowel sounds positive.  Musculoskeletal: no clubbing / cyanosis. No joint deformity upper and lower extremities.  Skin: no rashes, lesions, ulcers. No induration Neurologic: CN 2-12 grossly intact. Moving extremities. No focal neurologic deficits.  Psychiatric: Looks anxious.  Judgment and insight intact.   Labs on Admission: I have personally reviewed following labs and imaging studies  CBC: Recent Labs  Lab 12/27/18 1411  WBC 6.2  NEUTROABS 5.4  HGB 12.8*  HCT 38.4*  MCV 91.2  PLT 161*   Basic Metabolic Panel: Recent Labs  Lab 12/27/18 1411  NA 138  K 5.6*  CL 111  CO2 18*  GLUCOSE 209*  BUN 77*  CREATININE 3.42*  CALCIUM 9.0   GFR: Estimated Creatinine Clearance: 25.8 mL/min (A) (by C-G formula based on SCr of 3.42 mg/dL (H)). Liver Function Tests: Recent Labs  Lab 12/27/18 1411  AST 68*  ALT 67*  ALKPHOS 56  BILITOT 0.4  PROT 6.9  ALBUMIN 3.2*   No results for input(s): LIPASE, AMYLASE in the last 168 hours. No results for input(s): AMMONIA in the last 168 hours. Coagulation Profile: Recent Labs  Lab 12/27/18 1411  INR 1.1   Cardiac Enzymes: No results for input(s): CKTOTAL, CKMB, CKMBINDEX, TROPONINI in the last 168 hours. BNP (last 3 results) No results for  input(s): PROBNP in the last 8760 hours. HbA1C: No results for input(s): HGBA1C in the last 72 hours. CBG: No results for input(s): GLUCAP in the last 168 hours. Lipid Profile: Recent Labs    12/27/18 1411  TRIG 142   Thyroid Function Tests: No results for input(s): TSH, T4TOTAL, FREET4, T3FREE, THYROIDAB in the last 72 hours. Anemia Panel: Recent Labs    12/27/18 1411  FERRITIN 585*   Urine analysis:    Component Value Date/Time   COLORURINE YELLOW 07/26/2017 1833   APPEARANCEUR HAZY (A) 07/26/2017 1833   LABSPEC 1.031 (H) 07/26/2017 1833   PHURINE 5.0 07/26/2017 1833   GLUCOSEU 50 (A) 07/26/2017 1833   HGBUR NEGATIVE 07/26/2017 1833   BILIRUBINUR NEGATIVE 07/26/2017 1833   KETONESUR NEGATIVE 07/26/2017 1833   PROTEINUR 30 (A) 07/26/2017 1833   UROBILINOGEN 0.2 01/23/2011 0551   NITRITE NEGATIVE 07/26/2017 1833   LEUKOCYTESUR NEGATIVE 07/26/2017 1833    Radiological Exams on Admission: DG Chest Portable 1 View  Result Date: 12/27/2018 CLINICAL DATA:  Shortness of breath.  COVID. EXAM: PORTABLE CHEST 1 VIEW COMPARISON:  06/18/2017 FINDINGS: Loop recorder projects over the left heart. Mild cardiomegaly. No confluent opacities, effusions or edema. No acute bony abnormality. IMPRESSION: Cardiomegaly.  No acute cardiopulmonary disease. Electronically Signed   By: Rolm Baptise M.D.   On: 12/27/2018 14:06   Assessment/Plan  Acute kidney injury on chronic kidney disease stage IIIb in a patient with history of chronic kidney disease stage V status post renal transplantation and on chronic immunosuppression -Most likely secondary to dehydration from diarrhea -Spoke to Dr. Barbaraann Boys who states that patient's last creatinine during the last office visit with Dr. Goldsborough/nephrology earlier this month was 2.4 -Presented with creatinine of 3.42 -Patient received IV fluid bolus in the ED.  Will continue gentle hydration with normal saline at 100 cc an hour -Renal  ultrasound.  Check tacrolimus level in a.m. -Dr. Joelyn Oms recommends to continue immunosuppressants and prednisone.  If renal function were to worsen, will officially consult nephrology. -Strict input and output.  Daily weights.  Hold Lasix and Bactrim.  COVID-19 positive Diarrhea: Most likely viral diarrhea from COVID-19 -Patient mostly has diarrheal symptoms.  Will rule out other causes of diarrhea including C. Difficile. -If C. difficile turns out negative, will consider Imodium for diarrhea -Does not have much respiratory symptoms.  CRP is 7 but chest x-ray is negative.  Will not start steroids or remdesivir at this time.  Repeat a.m. inflammatory markers -Continue isolation  Mildly elevated LFTs  -Possible from above.  Repeat a.m. labs  Anemia of chronic disease -Hemoglobin stable.  Probably from chronic kidney disease.  Monitor  Chronic thrombocytopenia -Questionable cause.  No signs of bleeding.  Stable.  Diabetes mellitus type 2 uncontrolled with hyperglycemia -Continue long-acting insulin at a lower dose.  Continue CBGs with SSI.  Check A1c.  Hold Tradjenta.  Hypertension -Monitor blood pressure.  Continue labetalol.  Hold Lasix  Hyperlipidemia -Continue atorvastatin  Obstructive sleep apnea  -continue CPAP at night   DVT prophylaxis: Heparin Code Status: Full Family Communication: Spoke to patient at bedside Disposition Plan: Home in 2 to 3 days once clinically improved Consults called: None.  Spoke to nephrology/Dr. Joelyn Oms on phone on 12/27/2018 Admission status: Inpatient/telemetry  Severity of Illness: The appropriate patient status for this patient is INPATIENT. Inpatient status  is judged to be reasonable and necessary in order to provide the required intensity of service to ensure the patient's safety. The patient's presenting symptoms, physical exam findings, and initial radiographic and laboratory data in the context of their chronic comorbidities is felt to  place them at high risk for further clinical deterioration. Furthermore, it is not anticipated that the patient will be medically stable for discharge from the hospital within 2 midnights of admission. The following factors support the patient status of inpatient.   " The patient's presenting symptoms include diarrhea and severe weakness. " The worrisome physical exam findings include looks very deconditioned and dehydrated. " The initial radiographic and laboratory data are worrisome because of COVID-19 positive/acute kidney injury on chronic kidney disease. " The chronic co-morbidities include diabetes mellitus type 2/hypertension.   * I certify that at the point of admission it is my clinical judgment that the patient will require inpatient hospital care spanning beyond 2 midnights from the point of admission due to high intensity of service, high risk for further deterioration and high frequency of surveillance required.*  Patient has become very dehydrated secondary to continuous watery diarrhea and requires intravenous fluid resuscitation and close monitoring of his respiratory status for at least 2 midnights prior to discharge home.  Hence he will need admission to the hospital.     Aline August MD [facility]  12/27/2018, 4:27 PM

## 2018-12-27 NOTE — ED Triage Notes (Signed)
To ED via private vehicle- was dx with covid last Monday in [place]-- now is more short of breath- states started running a fever yesterday.

## 2018-12-27 NOTE — ED Notes (Signed)
Dinner Tray Ordered @ 1819. 

## 2018-12-27 NOTE — ED Provider Notes (Signed)
[facility] EMERGENCY DEPARTMENT Provider Note   CSN: [id number] Arrival date & time: 12/27/18  1306     History Chief Complaint  Patient presents with  . covid +  . Shortness of Breath    GRAHM ETSITTY is a 68 y.o. male.  The history is provided by the patient.  Shortness of Breath Severity:  Mild Onset quality:  Gradual Timing:  Constant Progression:  Worsening Chronicity:  New Context: URI (COVID positive, not feeling well for 8 days)   Relieved by:  Rest Worsened by:  Activity Associated symptoms: abdominal pain, chest pain, cough, fever and vomiting   Associated symptoms: no ear pain, no rash and no sore throat   Associated symptoms comment:  Nausea/vomiting/diarrhea Risk factors: no hx of PE/DVT   Risk factors comment:  Hx of CKD s/p renal transplant on immunosuppression      Past Medical History:  Diagnosis Date  . Abnormal gait   . Anemia of chronic disease   . Arthritis    back, hands   . Chronic back pain    radiculopathy and stenosis  . Chronic ischemic heart disease    Severe LVH  . Chronic kidney disease    [name], awaiting Transplant   . Congestive heart failure (CHF) ([place])   . CVA (cerebral infarction) 03/2013  . Diabetes mellitus     Lantus nightly ;type 2  . GERD (gastroesophageal reflux disease)   . H/O hiatal hernia   . History of colon polyps   . Hx of cardiovascular stress test    a.  Lexiscan Myoview (05/2013):  No ischemia, EF 53%; Normal Study  . Hyperlipidemia    takes Lovastatin daily  . Hypertension    takes Amlodipine and Catapres daily    dr Forrestine Him, Loop Recorder - [facility]  . Nocturia   . Obesity   . Peripheral edema    takes Lasix daily  . Sleep apnea    cpap , study in their home, 09/2102- [employer]           . Stroke (HCC)    speech, rt arm weakness  . Urinary frequency     Patient Active Problem List   Diagnosis Date Noted  . AKI (acute kidney injury) ([place]) 12/27/2018  . Ascending  aortic aneurysm ([place]) 10/23/2017  . Arteriovenous fistula for hemodialysis in place, primary ([place]) 08/08/2017  . Cerebral infarction ([place]) 08/08/2017  . Osteoarthritis (arthritis due to wear and tear of joints) 08/08/2017  . Acute renal failure superimposed on stage 4 chronic kidney disease ([place]) 06/04/2017  . GERD (gastroesophageal reflux disease) 06/04/2017  . Pulmonary hypertension, unspecified ([place]) 01/23/2017  . Peripheral edema 01/02/2017  . Acute right ankle pain 12/08/2016  . Fungal intestinal infection following organ transplantation 11/29/2016  . Finger infection, fungal 11/22/2016  . Mass of soft tissue of left upper extremity 10/18/2016  . Scapholunate dissociation of left wrist 10/18/2016  . Digital mucinous cyst of finger of left hand 10/10/2016  . Dyspnea on exertion 08/24/2016  . Swelling of lower extremity 06/30/2016  . Hypertension associated with transplantation 12/03/2014  . URI, acute 03/07/2014  . BK viremia 02/07/2014  . Type 2 diabetes mellitus without complications ([place]) 69/48/5462  . Hypomagnesemia 10/25/2013  . Elevated serum creatinine 10/24/2013  . Immunosuppressive management encounter following kidney transplant 10/21/2013  . Immunosuppression ([place]) 10/14/2013  . Pain at surgical site 10/01/2013  . HLD (hyperlipidemia) 05/02/2013  . OSA (obstructive sleep apnea) 05/02/2013  . History of stroke  03/16/2013  . Movement disorder 03/16/2013  . Lumbar stenosis with neurogenic claudication 12/27/2012  . Chronic back pain 01/27/2011  . HTN (hypertension) 01/27/2011  . History of kidney transplant 01/24/2011  . Hyperkalemia 01/23/2011  . Diabetes mellitus ([place]) 01/23/2011    Past Surgical History:  Procedure Laterality Date  . AV FISTULA PLACEMENT Left 04/03/2013   Procedure: ARTERIOVENOUS (AV) FISTULA CREATION- LEFT RADIOCEPHALIC VS BRACHIOCEPHALIC;  Surgeon: Conrad Centerville, MD;  Location: [facility];  Service: Vascular;  Laterality: Left;  . AV FISTULA  PLACEMENT Left 05/14/2013   Procedure: LEFT BRACHIOCEPHALIC ARTERIOVENOUS (AV) FISTULA CREATION;  Surgeon: Conrad Dyer, MD;  Location: [facility];  Service: Vascular;  Laterality: Left;  . BACK SURGERY  12/2012   2003- 1st back surgery & then 2014- fusion  . COLONOSCOPY    . ESOPHAGOGASTRODUODENOSCOPY    . HEMORRHOID SURGERY    . KIDNEY TRANSPLANT  10/01/2013  . left knee surgery    . LOOP RECORDER IMPLANT  03/19/13   MDT LinQ implanted by Dr Rayann Heman for cryptogenic stroke  . LOOP RECORDER IMPLANT N/A 03/19/2013   Procedure: LOOP RECORDER IMPLANT;  Surgeon: Coralyn Mark, MD;  Location: [facility] CATH LAB;  Service: Cardiovascular;  Laterality: N/A;  . NEPHRECTOMY TRANSPLANTED ORGAN    . NEUROPLASTY / TRANSPOSITION MEDIAN NERVE AT CARPAL TUNNEL BILATERAL    . right leg surgery     pin in place  . right wrist surgery    . TEE WITHOUT CARDIOVERSION N/A 03/19/2013   Procedure: TRANSESOPHAGEAL ECHOCARDIOGRAM (TEE);  Surgeon: [name], MD;  Location[facility] ENDOSCOPY;  Service: Cardiovascular;  Laterality: N/A;  . THYROID SURGERY  10/13/2015   Performed at [facility] with surgical pathology revealed consistent with benign follicular nodule ([name] category II)       Family History  Problem Relation Age of Onset  . Hypertension Mother   . Hyperlipidemia Father   . Hypertension Father   . Heart disease Father        before age 40  . Renal Disease Father   . Diabetes Brother   . Hyperlipidemia Son     Social History   Tobacco Use  . Smoking status: Former Smoker    Packs/day: 0.50    Years: 40.00    Pack years: 20.00    Types: Cigarettes    Quit date: 12/27/2012    Years since quitting: 6.0  . Smokeless tobacco: Never Used  Substance Use Topics  . Alcohol use: No    Alcohol/week: 0.0 standard drinks  . Drug use: No    Home Medications Prior to Admission medications   Medication Sig Start Date End Date Taking? Authorizing Provider  aspirin EC 81 MG tablet Take 81 mg by mouth daily.      [provider]  atorvastatin (LIPITOR) 40 MG tablet Take 40 mg by mouth daily.     [provider]  calcitRIOL (ROCALTROL) 0.25 MCG capsule Take 0.25 mcg by mouth See admin instructions. Take 1 capsule (0.25 mcg) by mouth five times weekly - Monday thru Friday    [provider]  furosemide (LASIX) 40 MG tablet Take 40 mg by mouth daily.    [provider]  gabapentin (NEURONTIN) 300 MG capsule Take 300 mg by mouth 2 (two) times daily.    [provider]  HYDROcodone-acetaminophen (NORCO) 10-325 MG tablet Take 1-2 tablets by mouth every 6 (six) hours as needed (pain).  12/26/16   [provider]  insulin glargine (LANTUS)  100 unit/mL SOPN Inject 24 Units into the skin at bedtime.     [provider]  insulin lispro (HUMALOG KWIKPEN) 100 UNIT/ML KiwkPen Inject 5-15 Units into the skin See admin instructions. Inject 5 units subcutaneously three times daily before meals adjusted per carb count - add 2 units for every 25 points    [provider]  methimazole (TAPAZOLE) 5 MG tablet Take 5 mg by mouth daily. 06/29/17   [provider]  Multiple Vitamin (MULTIVITAMIN WITH MINERALS) TABS tablet Take 1 tablet by mouth daily.    [provider]  mycophenolate (MYFORTIC) 180 MG EC tablet Take 180 mg by mouth. 2 tablets in the am and 1 tablet in the pm    [provider]  pantoprazole (PROTONIX) 40 MG tablet Take 1 tablet (40 mg total) by mouth daily. 10/09/17   Jackquline Denmark, MD  predniSONE (DELTASONE) 5 MG tablet Take 5 mg by mouth daily with breakfast.    [provider]  sulfamethoxazole-trimethoprim (BACTRIM,SEPTRA) 400-80 MG tablet Take 1 tablet by mouth 3 (three) times a week. M/W/F    [provider]  tacrolimus (PROGRAF) 1 MG capsule Take 2 mg by mouth 2 (two) times daily. 06/28/17   [provider]  TRADJENTA 5 MG TABS tablet Take 5 mg by mouth daily. 04/17/18   [provider]  vitamin B-12 (CYANOCOBALAMIN) 1000 MCG tablet Take 1,000 mcg by mouth 2 (two) times daily.     [provider]    Allergies    Lisinopril, Meloxicam, and Victoza [liraglutide]  Review of Systems   Review of Systems  Constitutional: Positive for fever. Negative for chills.  HENT: Negative for ear pain and sore throat.   Eyes: Negative for pain and visual disturbance.  Respiratory: Positive for cough and shortness of breath.   Cardiovascular: Positive for chest pain. Negative for palpitations.  Gastrointestinal: Positive for abdominal pain, diarrhea, nausea and vomiting.  Genitourinary: Negative for dysuria and hematuria.  Musculoskeletal: Negative for arthralgias and back pain.  Skin: Negative for color change and rash.  Neurological: Negative for seizures and syncope.  All other systems reviewed and are negative.   Physical Exam Updated Vital Signs  ED Triage Vitals  Enc Vitals Group     BP 12/27/18 1315 (!) 110/53     Pulse Rate 12/27/18 1315 98     Resp 12/27/18 1315 20     Temp 12/27/18 1315 100.3 F (37.9 C)     Temp Source 12/27/18 1315 Oral     SpO2 12/27/18 1315 96 %     Weight 12/27/18 1327 231 lb (104.8 kg)     Height 12/27/18 1327 5\' 11"  (1.803 m)     Head Circumference --      Peak Flow --      Pain Score 12/27/18 1325 0     Pain Loc --      Pain Edu? --      Excl. in [place]? --     Physical Exam Vitals and nursing note reviewed.  Constitutional:      General: He is not in acute distress.    Appearance: He is well-developed. He is obese. He is ill-appearing.  HENT:     Head: Normocephalic and atraumatic.     Mouth/Throat:     Comments: Dry mucous membranes Eyes:     Conjunctiva/sclera: Conjunctivae normal.     Pupils: Pupils are equal, round, and reactive to light.  Cardiovascular:     Rate  and Rhythm: Normal rate and regular rhythm.     Pulses: Normal pulses.     Heart sounds: Normal heart sounds. No murmur.  Pulmonary:      Effort: Tachypnea present. No respiratory distress.     Breath sounds: Normal breath sounds. No decreased breath sounds, wheezing, rhonchi or rales.  Abdominal:     Palpations: Abdomen is soft.     Tenderness: There is no abdominal tenderness.  Musculoskeletal:     Cervical back: Normal range of motion and neck supple.     Right lower leg: No edema.     Left lower leg: No edema.  Skin:    General: Skin is warm and dry.     Capillary Refill: Capillary refill takes less than 2 seconds.  Neurological:     General: No focal deficit present.     Mental Status: He is alert.  Psychiatric:        Mood and Affect: Mood normal.     ED Results / Procedures / Treatments   Labs (all labs ordered are listed, but only abnormal results are displayed) Labs Reviewed  COMPREHENSIVE METABOLIC PANEL - Abnormal; Notable for the following components:      Result Value   Potassium 5.6 (*)    CO2 18 (*)    Glucose, Bld 209 (*)    BUN 77 (*)    Creatinine, Ser 3.42 (*)    Albumin 3.2 (*)    AST 68 (*)    ALT 67 (*)    GFR calc non Af Amer 18 (*)    GFR calc Af Amer 20 (*)    All other components within normal limits  CBC WITH DIFFERENTIAL/PLATELET - Abnormal; Notable for the following components:   RBC 4.21 (*)    Hemoglobin 12.8 (*)    HCT 38.4 (*)    Platelets 108 (*)    Lymphs Abs 0.3 (*)    All other components within normal limits  LACTATE DEHYDROGENASE - Abnormal; Notable for the following components:   LDH 270 (*)    All other components within normal limits  FIBRINOGEN - Abnormal; Notable for the following components:   Fibrinogen 698 (*)    All other components within normal limits  C-REACTIVE PROTEIN - Abnormal; Notable for the following components:   CRP 7.0 (*)    All other components within normal limits  POC SARS CORONAVIRUS 2 AG -  ED - Abnormal; Notable for the following components:   SARS Coronavirus 2 Ag POSITIVE (*)    All other components within normal limits   CULTURE, BLOOD (ROUTINE X 2)  CULTURE, BLOOD (ROUTINE X 2)  LACTIC ACID, PLASMA  TRIGLYCERIDES  LACTIC ACID, PLASMA  URINALYSIS, ROUTINE W REFLEX MICROSCOPIC  D-DIMER, QUANTITATIVE (NOT AT [facility])  PROTIME-INR  FERRITIN  PROCALCITONIN    EKG EKG Interpretation  Date/Time:  Thursday December 27 2018 14:14:30 EST Ventricular Rate:  85 PR Interval:  160 QRS Duration: 76 QT Interval:  328 QTC Calculation: 390 R Axis:   -25 Text Interpretation: Normal sinus rhythm Normal ECG Confirmed by Lennice Sites [phone]) on 12/27/2018 2:30:32 PM   Radiology DG Chest Portable 1 View  Result Date: 12/27/2018 CLINICAL DATA:  Shortness of breath.  COVID. EXAM: PORTABLE CHEST 1 VIEW COMPARISON:  06/18/2017 FINDINGS: Loop recorder projects over the left heart. Mild cardiomegaly. No confluent opacities, effusions or edema. No acute bony abnormality. IMPRESSION: Cardiomegaly.  No acute cardiopulmonary disease. Electronically Signed   By: Rolm Baptise M.D.  On: 12/27/2018 14:06    Procedures .Critical Care Performed by: Lennice Sites, DO Authorized by: Lennice Sites, DO   Critical care provider statement:    Critical care time (minutes):  35   Critical care was necessary to treat or prevent imminent or life-threatening deterioration of the following conditions:  Metabolic crisis and dehydration   Critical care was time spent personally by me on the following activities:  Blood draw for specimens, development of treatment plan with patient or surrogate, discussions with primary provider, evaluation of patient's response to treatment, examination of patient, obtaining history from patient or surrogate, ordering and performing treatments and interventions, ordering and review of laboratory studies, ordering and review of radiographic studies, pulse oximetry, re-evaluation of patient's condition and review of old charts   I assumed direction of critical care for this patient from another provider in  my specialty: no     (including critical care time)  Medications Ordered in ED Medications  sodium chloride flush (NS) 0.9 % injection 3 mL (has no administration in time range)  sodium chloride 0.9 % bolus 500 mL (has no administration in time range)  acetaminophen (TYLENOL) tablet 1,000 mg (1,000 mg Oral Given 12/27/18 1423)  ondansetron (ZOFRAN) injection 4 mg (4 mg Intravenous Given 12/27/18 1422)  sodium chloride 0.9 % bolus 500 mL (500 mLs Intravenous New Bag/Given 12/27/18 1518)    ED Course  I have reviewed the triage vital signs and the nursing notes.  Pertinent labs & imaging results that were available during my care of the patient were reviewed by me and considered in my medical decision making (see chart for details).    MDM Rules/Calculators/A&P  Jon [name] is a 68 year old male with history of high cholesterol, diabetes, CKD status post renal transplant on chronic immunosuppression who presents the ED with shortness of breath, fever, GI symptoms in the setting of recent positive coronavirus.  Patient with normal vitals.  Low-grade fever.  Patient with oxygenation between 90 and 92%.  Was placed on 2 L for comfort.  Does not have any focal abdominal tenderness on exam.  He is a little bit tachypneic.  Suspect the symptoms are secondary to coronavirus.  He does appear dehydrated.  Given that he is a renal transplant patient concern for AKI and other electrolyte abnormalities.  Chest x-ray and other infectious lab work was initiated.  Patient given Zofran, Tylenol, 500 cc fluid bolus.  Will reevaluate after labs.  Chest x-ray shows no obvious infectious process.  No pneumothorax.  Patient did retest positive here today as well for coronavirus.  Lactic acid was normal.  No significant leukocytosis, anemia.  However creatinine is 3.42.  Creatinine baseline is around 2.  Does have a little bit of hyperglycemia as well.  Suspect elevation in creatinine is secondary to dehydration  in the setting of coronavirus.  An additional 500 cc normal saline bolus was ordered.  Patient to be admitted to medicine for further care given AKI in the setting of coronavirus in a patient with renal transplant.  Hemodynamically stable throughout my care.  This chart was dictated using voice recognition software.  Despite best efforts to proofread,  errors can occur which can change the documentation meaning.  PEDRO WHITERS was evaluated in Emergency Department on 12/27/2018 for the symptoms described in the history of present illness. He was evaluated in the context of the global COVID-19 pandemic, which necessitated consideration that the patient might be at risk for infection with the SARS-CoV-2 virus  that causes COVID-19. Institutional protocols and algorithms that pertain to the evaluation of patients at risk for COVID-19 are in a state of rapid change based on information released by regulatory bodies including the CDC and federal and state organizations. These policies and algorithms were followed during the patient's care in the ED.   Final Clinical Impression(s) / ED Diagnoses Final diagnoses:  COVID-19  AKI (acute kidney injury) [facility])    Rx / DC Orders ED Discharge Orders    None       Lennice Sites, DO 12/27/18 1550

## 2018-12-28 DIAGNOSIS — N179 Acute kidney failure, unspecified: Secondary | ICD-10-CM

## 2018-12-28 LAB — URINALYSIS, ROUTINE W REFLEX MICROSCOPIC
Bacteria, UA: NONE SEEN
Bilirubin Urine: NEGATIVE
Glucose, UA: NEGATIVE mg/dL
Ketones, ur: NEGATIVE mg/dL
Leukocytes,Ua: NEGATIVE
Nitrite: NEGATIVE
Protein, ur: 30 mg/dL — AB
Specific Gravity, Urine: 1.019 (ref 1.005–1.030)
pH: 5 (ref 5.0–8.0)

## 2018-12-28 LAB — CBC WITH DIFFERENTIAL/PLATELET
Abs Immature Granulocytes: 0.02 10*3/uL (ref 0.00–0.07)
Basophils Absolute: 0 10*3/uL (ref 0.0–0.1)
Basophils Relative: 0 %
Eosinophils Absolute: 0 10*3/uL (ref 0.0–0.5)
Eosinophils Relative: 1 %
HCT: 38.1 % — ABNORMAL LOW (ref 39.0–52.0)
Hemoglobin: 11.8 g/dL — ABNORMAL LOW (ref 13.0–17.0)
Immature Granulocytes: 1 %
Lymphocytes Relative: 11 %
Lymphs Abs: 0.5 10*3/uL — ABNORMAL LOW (ref 0.7–4.0)
MCH: 29.7 pg (ref 26.0–34.0)
MCHC: 31 g/dL (ref 30.0–36.0)
MCV: 96 fL (ref 80.0–100.0)
Monocytes Absolute: 0.4 10*3/uL (ref 0.1–1.0)
Monocytes Relative: 9 %
Neutro Abs: 3.3 10*3/uL (ref 1.7–7.7)
Neutrophils Relative %: 78 %
Platelets: 99 10*3/uL — ABNORMAL LOW (ref 150–400)
RBC: 3.97 MIL/uL — ABNORMAL LOW (ref 4.22–5.81)
RDW: 14.5 % (ref 11.5–15.5)
WBC: 4.1 10*3/uL (ref 4.0–10.5)
nRBC: 0 % (ref 0.0–0.2)

## 2018-12-28 LAB — LACTATE DEHYDROGENASE: LDH: 265 U/L — ABNORMAL HIGH (ref 98–192)

## 2018-12-28 LAB — HIV ANTIBODY (ROUTINE TESTING W REFLEX): HIV Screen 4th Generation wRfx: NONREACTIVE

## 2018-12-28 LAB — COMPREHENSIVE METABOLIC PANEL
ALT: 65 U/L — ABNORMAL HIGH (ref 0–44)
AST: 61 U/L — ABNORMAL HIGH (ref 15–41)
Albumin: 3 g/dL — ABNORMAL LOW (ref 3.5–5.0)
Alkaline Phosphatase: 53 U/L (ref 38–126)
Anion gap: 10 (ref 5–15)
BUN: 78 mg/dL — ABNORMAL HIGH (ref 8–23)
CO2: 18 mmol/L — ABNORMAL LOW (ref 22–32)
Calcium: 8.3 mg/dL — ABNORMAL LOW (ref 8.9–10.3)
Chloride: 113 mmol/L — ABNORMAL HIGH (ref 98–111)
Creatinine, Ser: 3.25 mg/dL — ABNORMAL HIGH (ref 0.61–1.24)
GFR calc Af Amer: 22 mL/min — ABNORMAL LOW (ref >60)
GFR calc non Af Amer: 19 mL/min — ABNORMAL LOW (ref >60)
Glucose, Bld: 158 mg/dL — ABNORMAL HIGH (ref 70–99)
Potassium: 5.7 mmol/L — ABNORMAL HIGH (ref 3.5–5.1)
Sodium: 141 mmol/L (ref 135–145)
Total Bilirubin: 0.5 mg/dL (ref 0.3–1.2)
Total Protein: 6.6 g/dL (ref 6.5–8.1)

## 2018-12-28 LAB — C DIFFICILE QUICK SCREEN W PCR REFLEX
C Diff antigen: NEGATIVE
C Diff interpretation: NOT DETECTED
C Diff toxin: NEGATIVE

## 2018-12-28 LAB — FIBRINOGEN: Fibrinogen: 615 mg/dL — ABNORMAL HIGH (ref 210–475)

## 2018-12-28 LAB — CBG MONITORING, ED
Glucose-Capillary: 137 mg/dL — ABNORMAL HIGH (ref 70–99)
Glucose-Capillary: 154 mg/dL — ABNORMAL HIGH (ref 70–99)
Glucose-Capillary: 142 mg/dL — ABNORMAL HIGH (ref 70–99)
Glucose-Capillary: 175 mg/dL — ABNORMAL HIGH (ref 70–99)

## 2018-12-28 LAB — HEMOGLOBIN A1C
Hgb A1c MFr Bld: 7.8 % — ABNORMAL HIGH (ref 4.8–5.6)
Mean Plasma Glucose: 177.16 mg/dL

## 2018-12-28 LAB — FERRITIN: Ferritin: 611 ng/mL — ABNORMAL HIGH (ref 24–336)

## 2018-12-28 LAB — MAGNESIUM: Magnesium: 2.2 mg/dL (ref 1.7–2.4)

## 2018-12-28 LAB — C-REACTIVE PROTEIN: CRP: 8.3 mg/dL — ABNORMAL HIGH (ref <1.0)

## 2018-12-28 LAB — D-DIMER, QUANTITATIVE: D-Dimer, Quant: 2.11 ug/mL-FEU — ABNORMAL HIGH (ref 0.00–0.50)

## 2018-12-28 MED ORDER — DEXAMETHASONE SODIUM PHOSPHATE 10 MG/ML IJ SOLN
8.0000 mg | INTRAMUSCULAR | Status: DC
  Administered 2018-12-28: 8 mg via INTRAVENOUS
  Filled 2018-12-28 (×2): qty 1

## 2018-12-28 NOTE — ED Notes (Signed)
Pt assisted by this NT to use urinal. Pt brief had slight BM. Pt cleaned and brief changed.

## 2018-12-28 NOTE — ED Notes (Signed)
[phone] Jon [name] pts wife looking for an update on the patient

## 2018-12-28 NOTE — ED Notes (Signed)
+  tele  Breakfast ordered 

## 2018-12-28 NOTE — ED Notes (Addendum)
Assisted patient out of soiled linens. Noted loose bowel movements, patient stated he has had 5 episodes. Patient appears short of breathe with activity. Able to get up for a couple of steps with some assistance. Family member Lisabeth Pick is updated on patients disposition.

## 2018-12-28 NOTE — ED Notes (Signed)
[phone] Lisabeth Pick pts daughter please call soon as possible, update

## 2018-12-28 NOTE — ED Notes (Signed)
Pt incontinent of stool; pt cleaned, brief and bedding changed

## 2018-12-28 NOTE — Progress Notes (Signed)
Hospitalist daily note   NICOLA QUESNELL [id number] DOB: 28-Jun-1950 DOA: 12/27/2018  PCP: [name], MD   Narrative:  31 wm CKD 5 LUE AV fistula live donor transplant 10/04/2013 CMV negative recipient positive (baseline creatinine 1.8-2.3), CVA 03/2013-ILR placed 03/19/2013 DM TY 2 A1c, HTN pulmonary HTN followed by cardiology Nehemiah Massed ascending aortic aneurysm thyroidectomy benign follicular nodule 6606, multiple back surgeries-sleep apnea on CPAP Diagnosed coronavirus ~12/20/2018 main symptoms nausea vomiting-myalgia mild S OB Found to have AKI and admitted  Data Reviewed:  Creatinine 3.4-->78/3.2 Potassium 5.7, CO2 18 Ferritin 585-->611 CRP 7.0-->8.3 Procalcitonin 0.22 Dimer 1.86-->2.11 Fibrinogen 698-->650 Hemoglobin 11.8 platelet down from baseline in the past 120s 130s to 99 Assessment & Plan: Coronavirus 19  infection without pneumonia No resp symptoms-hold decadron-keep on ususal prednisone Monitor trends Was placed on Oxygen overnight 2/2 need for night CPAP Coronavirus related diarrhea Monitor trends of creatinine continue saline 100 cc/H2--- can check for C. difficile GI pathogen panel PCR although this is unlikely ESRD stage IV/V live donor transplant 09/2013 Baseline creatinine 1.8-2.3 Continue fluids as above continue Myfortic 180 twice daily tacrolimus to twice daily With hold Bactrim 1 tab Monday Wednesday Friday for now Cryptogenic CVA 03/2013 with ILR Continue aspirin 81 Pulmonary HTN Careful with fluid resuscitation strategy- Lasix 40 held Can continue labetalol 100 twice daily DM TY 2 A1c 7.8 with ESRD and nephropathy as well as neuropathy Expect slight worsening control with IV steroids currently CBGs 1 37-1 74 Continue Lantus 15 units (home dose 24), moderate sliding scale (home meds 5 to 15 units) Home meds include Tradjenta as 5 daily which will be resumed Continue gabapentin 300 twice daily Thyroidectomy Continue Tapazole 2.5 mg  daily Reflux Continue pantoprazole 40 daily LUTS Continue Flomax 0.4 at bedtime  Heparin prophylactic dosing, inpatient stay at [facility], no family present, expect discharge in 5 to 7 days  Subjective:  fair no distress --1 episode diarr today Doesn't feel SOB When I turn off his oxygen he stays at 97% No fever, chills  Consultants:    Procedures:    Antimicrobials:         Objective: Vitals:   12/28/18 0300 12/28/18 0339 12/28/18 0611 12/28/18 0612  BP: (!) 133/99 138/80 (!) 150/82 (!) 150/82  Pulse: 74 79 80 78  Resp: (!) 22 16  16   Temp:  98 F (36.7 C)  98.1 F (36.7 C)  TempSrc:  Oral  Oral  SpO2: 97% 100% 99% 98%  Weight:      Height:        Intake/Output Summary (Last 24 hours) at 12/28/2018 0754 Last data filed at 12/27/2018 1750 Gross per 24 hour  Intake 1000 ml  Output --  Net 1000 ml   Filed Weights   12/27/18 1327  Weight: 104.8 kg    Examination: Awake coherent in nad no focal deficit no new issues cta b no added sound no rales no rhonchi abd soft obese nt nd no rebound no guard No le edema  neuro intaxt no focal deficit  Scheduled Meds: . aspirin EC  81 mg Oral Daily  . atorvastatin  40 mg Oral q1800  . gabapentin  300 mg Oral BID  . heparin  5,000 Units Subcutaneous Q8H  . insulin aspart  0-15 Units Subcutaneous TID WC  . insulin aspart  0-5 Units Subcutaneous QHS  . insulin glargine  15 Units Subcutaneous QHS  . labetalol  100 mg Oral BID  . methimazole  2.5 mg Oral  Daily  . mycophenolate  180 mg Oral BID  . predniSONE  5 mg Oral Q breakfast  . sodium chloride flush  3 mL Intravenous Once  . tacrolimus  2 mg Oral BID  . tamsulosin  0.4 mg Oral QHS   Continuous Infusions: . sodium chloride 100 mL/hr at 12/28/18 0628     LOS: 1 day   Time spent: [place], MD [facility]

## 2018-12-28 NOTE — ED Notes (Signed)
This RN reminded pt of need for stool sample. Pt acknowledged need for sample but denied need to use restroom. Will continue to monitor.

## 2018-12-28 NOTE — ED Notes (Signed)
Lunch Tray Ordered @ 1045 

## 2018-12-28 NOTE — ED Notes (Addendum)
ED TO INPATIENT HANDOFF REPORT  ED Nurse Name and Phone #:   S Name/Age/Gender Jon [name] 68 y.o. male Room/Bed: 014C/014C  Code Status   Code Status: Full Code  Home/SNF/Other Home Patient oriented to: self, place, time and situation Is this baseline? Yes   Triage Complete: Triage complete(Simultaneous filing. User may not have seen previous data.)  Chief Complaint AKI (acute kidney injury) [facility]) [N17.9]  Triage Note To ED via private vehicle- was dx with covid last Monday in [place]-- now is more short of breath- states started running a fever yesterday.     Allergies Allergies  Allergen Reactions  . Lisinopril Other (See Comments)    Increased potassium level   . Meloxicam Nausea And Vomiting  . Victoza [Liraglutide] Diarrhea, Nausea And Vomiting and Swelling    Level of Care/Admitting Diagnosis ED Disposition    ED Disposition Condition [facility] Area: [place] [[id number]]  Level of Care: Telemetry Medical [104]  Covid Evaluation: Confirmed COVID Positive  Diagnosis: AKI (acute kidney injury) [facility]) [[id number]]  Admitting Physician: Aline August [[id number]]  Attending Physician: Aline August [[id number]]  Estimated length of stay: 3 - 4 days  Certification:: I certify this patient will need inpatient services for at least 2 midnights       B Medical/Surgery History Past Medical History:  Diagnosis Date  . Abnormal gait   . Anemia of chronic disease   . Arthritis    back, hands   . Chronic back pain    radiculopathy and stenosis  . Chronic ischemic heart disease    Severe LVH  . Chronic kidney disease    [name], awaiting Transplant   . Congestive heart failure (CHF) ([place])   . CVA (cerebral infarction) 03/2013  . Diabetes mellitus     Lantus nightly ;type 2  . GERD (gastroesophageal reflux disease)   . H/O hiatal hernia   . History of colon polyps   . Hx of cardiovascular stress test    a.  Lexiscan  Myoview (05/2013):  No ischemia, EF 53%; Normal Study  . Hyperlipidemia    takes Lovastatin daily  . Hypertension    takes Amlodipine and Catapres daily    dr Forrestine Him, Loop Recorder - [facility]  . Nocturia   . Obesity   . Peripheral edema    takes Lasix daily  . Sleep apnea    cpap , study in their home, 09/2102- [employer]           . Stroke (HCC)    speech, rt arm weakness  . Urinary frequency    Past Surgical History:  Procedure Laterality Date  . AV FISTULA PLACEMENT Left 04/03/2013   Procedure: ARTERIOVENOUS (AV) FISTULA CREATION- LEFT RADIOCEPHALIC VS BRACHIOCEPHALIC;  Surgeon: Conrad [place], MD;  Location: [place];  Service: Vascular;  Laterality: Left;  . AV FISTULA PLACEMENT Left 05/14/2013   Procedure: LEFT BRACHIOCEPHALIC ARTERIOVENOUS (AV) FISTULA CREATION;  Surgeon: Conrad , MD;  Location: [place];  Service: Vascular;  Laterality: Left;  . BACK SURGERY  12/2012   2003- 1st back surgery & then 2014- fusion  . COLONOSCOPY    . ESOPHAGOGASTRODUODENOSCOPY    . HEMORRHOID SURGERY    . KIDNEY TRANSPLANT  10/01/2013  . left knee surgery    . LOOP RECORDER IMPLANT  03/19/13   MDT LinQ implanted by Dr Rayann Heman for cryptogenic stroke  . LOOP RECORDER IMPLANT N/A 03/19/2013   Procedure: LOOP RECORDER IMPLANT;  Surgeon: Coralyn Mark, MD;  Location: [facility] CATH LAB;  Service: Cardiovascular;  Laterality: N/A;  . NEPHRECTOMY TRANSPLANTED ORGAN    . NEUROPLASTY / TRANSPOSITION MEDIAN NERVE AT CARPAL TUNNEL BILATERAL    . right leg surgery     pin in place  . right wrist surgery    . TEE WITHOUT CARDIOVERSION N/A 03/19/2013   Procedure: TRANSESOPHAGEAL ECHOCARDIOGRAM (TEE);  Surgeon: [name], MD;  Location: [facility] ENDOSCOPY;  Service: Cardiovascular;  Laterality: N/A;  . THYROID SURGERY  10/13/2015   Performed at [facility] with surgical pathology revealed consistent with benign follicular nodule ([name] category II)     A IV Location/Drains/Wounds Patient Lines/Drains/Airways Status    Active Line/Drains/Airways    Name:   Placement date:   Placement time:   Site:   Days:   Peripheral IV 12/27/18 Right Antecubital   12/27/18    1420    Antecubital   1   Fistula / Graft Left Forearm Arteriovenous fistula   04/03/13    1405    Forearm   2095   Fistula / Graft Left Upper arm Arteriovenous fistula   05/14/13    0826    Upper arm   2054          Intake/Output Last 24 hours  Intake/Output Summary (Last 24 hours) at 12/28/2018 1817 Last data filed at 12/28/2018 1749 Gross per 24 hour  Intake --  Output 800 ml  Net -800 ml    Labs/Imaging Results for orders placed or performed during the hospital encounter of 12/27/18 (from the past 48 hour(s))  Lactic acid, plasma     Status: None   Collection Time: 12/27/18  2:11 PM  Result Value Ref Range   Lactic Acid, Venous 1.1 0.5 - 1.9 mmol/L    Comment: Performed at [facility], [place] [address] [place], [place] [address]  Comprehensive metabolic panel     Status: Abnormal   Collection Time: 12/27/18  2:11 PM  Result Value Ref Range   Sodium 138 135 - 145 mmol/L   Potassium 5.6 (H) 3.5 - 5.1 mmol/L   Chloride 111 98 - 111 mmol/L   CO2 18 (L) 22 - 32 mmol/L   Glucose, Bld 209 (H) 70 - 99 mg/dL   BUN 77 (H) 8 - 23 mg/dL   Creatinine, Ser 3.42 (H) 0.61 - 1.24 mg/dL   Calcium 9.0 8.9 - 10.3 mg/dL   Total Protein 6.9 6.5 - 8.1 g/dL   Albumin 3.2 (L) 3.5 - 5.0 g/dL   AST 68 (H) 15 - 41 U/L   ALT 67 (H) 0 - 44 U/L   Alkaline Phosphatase 56 38 - 126 U/L   Total Bilirubin 0.4 0.3 - 1.2 mg/dL   GFR calc non Af Amer 18 (L) >60 mL/min   GFR calc Af Amer 20 (L) >60 mL/min   Anion gap 9 5 - 15    Comment: Performed at [facility], [place] [address] [place], [place] [address]  CBC with Differential     Status: Abnormal   Collection Time: 12/27/18  2:11 PM  Result Value Ref Range   WBC 6.2 4.0 - 10.5 K/uL   RBC 4.21 (L) 4.22 - 5.81 MIL/uL   Hemoglobin 12.8 (L) 13.0 - 17.0 g/dL   HCT 38.4 (L) 39.0 - 52.0 %   MCV  91.2 80.0 - 100.0 fL   MCH 30.4 26.0 - 34.0 pg   MCHC 33.3 30.0 - 36.0 g/dL  RDW 13.9 11.5 - 15.5 %   Platelets 108 (L) 150 - 400 K/uL    Comment: REPEATED TO VERIFY PLATELET COUNT CONFIRMED BY SMEAR SPECIMEN CHECKED FOR CLOTS Immature Platelet Fraction may be clinically indicated, consider ordering this additional test [id number]    nRBC 0.0 0.0 - 0.2 %   Neutrophils Relative % 89 %   Neutro Abs 5.4 1.7 - 7.7 K/uL   Lymphocytes Relative 4 %   Lymphs Abs 0.3 (L) 0.7 - 4.0 K/uL   Monocytes Relative 7 %   Monocytes Absolute 0.5 0.1 - 1.0 K/uL   Eosinophils Relative 0 %   Eosinophils Absolute 0.0 0.0 - 0.5 K/uL   Basophils Relative 0 %   Basophils Absolute 0.0 0.0 - 0.1 K/uL   Immature Granulocytes 0 %   Abs Immature Granulocytes 0.02 0.00 - 0.07 K/uL    Comment: Performed at [facility] [address], [place], [place] [address]  D-dimer, quantitative (not at [facility])     Status: Abnormal   Collection Time: 12/27/18  2:11 PM  Result Value Ref Range   D-Dimer, Quant 1.86 (H) 0.00 - 0.50 ug/mL-FEU    Comment: (NOTE) At the manufacturer cut-off of 0.50 ug/mL FEU, this assay has been documented to exclude PE with a sensitivity and negative predictive value of 97 to 99%.  At this time, this assay has not been approved by the FDA to exclude DVT/VTE. Results should be correlated with clinical presentation. Performed at [facility], [place] [address], [place], [place] [address]   Protime-INR     Status: None   Collection Time: 12/27/18  2:11 PM  Result Value Ref Range   Prothrombin Time 13.9 11.4 - 15.2 seconds   INR 1.1 0.8 - 1.2    Comment: (NOTE) INR goal varies based on device and disease states. Performed at [facility], [place] [address] [place], [place] [address]   Ferritin (Iron Binding Protein)     Status: Abnormal   Collection Time: 12/27/18  2:11 PM  Result Value Ref Range   Ferritin 585 (H) 24 - 336 ng/mL    Comment: Performed at [facility], [place] [address] [place], [place] [address]  Procalcitonin     Status: None   Collection Time: 12/27/18  2:11 PM  Result Value Ref Range   Procalcitonin 0.22 ng/mL    Comment:        Interpretation: PCT (Procalcitonin) <= 0.5 ng/mL: Systemic infection (sepsis) is not likely. Local bacterial infection is possible. (NOTE)       Sepsis PCT Algorithm           Lower Respiratory Tract                                      Infection PCT Algorithm    ----------------------------     ----------------------------         PCT < 0.25 ng/mL                PCT < 0.10 ng/mL         Strongly encourage             Strongly discourage   discontinuation of antibiotics    initiation of antibiotics    ----------------------------     -----------------------------       PCT 0.25 - 0.50 ng/mL  PCT 0.10 - 0.25 ng/mL               OR       >80% decrease in PCT            Discourage initiation of                                            antibiotics      Encourage discontinuation           of antibiotics    ----------------------------     -----------------------------         PCT >= 0.50 ng/mL              PCT 0.26 - 0.50 ng/mL               AND        <80% decrease in PCT             Encourage initiation of                                             antibiotics       Encourage continuation           of antibiotics    ----------------------------     -----------------------------        PCT >= 0.50 ng/mL                  PCT > 0.50 ng/mL               AND         increase in PCT                  Strongly encourage                                      initiation of antibiotics    Strongly encourage escalation           of antibiotics                                     -----------------------------                                           PCT <= 0.25 ng/mL                                                 OR                                        > 80% decrease in PCT  Discontinue / Do not initiate                                             antibiotics Performed at [facility], [place] [address], [place], [place] [address]   Lactate dehydrogenase     Status: Abnormal   Collection Time: 12/27/18  2:11 PM  Result Value Ref Range   LDH 270 (H) 98 - 192 U/L    Comment: Performed at [facility], [place] [address] [place], [place] [address]  Fibrinogen     Status: Abnormal   Collection Time: 12/27/18  2:11 PM  Result Value Ref Range   Fibrinogen 698 (H) 210 - 475 mg/dL    Comment: Performed at [facility] [facility] [address], [place], [place] [address]  C-reactive protein     Status: Abnormal   Collection Time: 12/27/18  2:11 PM  Result Value Ref Range   CRP 7.0 (H) <1.0 mg/dL    Comment: Performed at [place] [address] [place], [place] [address]  Triglycerides     Status: None   Collection Time: 12/27/18  2:11 PM  Result Value Ref Range   Triglycerides 142 <150 mg/dL    Comment: Performed at [facility], [place] [address] [place], [place] [address]  Blood Culture (routine x 2)     Status: None (Preliminary result)   Collection Time: 12/27/18  2:13 PM   Specimen: BLOOD  Result Value Ref Range   Specimen Description BLOOD RIGHT ANTECUBITAL    Special Requests      BOTTLES DRAWN AEROBIC AND ANAEROBIC Blood Culture adequate volume   Culture      NO GROWTH 1 DAY Performed at [facility], [place] [address], [place], [place] [address]    Report Status PENDING   POC SARS Coronavirus 2 Ag-ED - Nasal Swab (BD Veritor Kit)     Status: Abnormal   Collection Time: 12/27/18  2:15 PM  Result Value Ref Range   SARS Coronavirus 2 Ag POSITIVE (A) NEGATIVE    Comment: (NOTE) SARS-CoV-2 antigen PRESENT. Positive results indicate the presence of viral antigens, but clinical correlation with patient history and other diagnostic information is necessary to determine patient infection status.  Positive  results do not rule out bacterial infection or co-infection  with other viruses. False positive results are rare but can occur, and confirmatory RT-PCR testing may be appropriate in some circumstances. The expected result is Negative. Fact Sheet for Patients: PodPark.tn Fact Sheet for Providers: GiftContent.is  This test is not yet approved or cleared by the [place] FDA and  has been authorized for detection and/or diagnosis of SARS-CoV-2 by FDA under an Emergency Use Authorization (EUA).  This EUA will remain in effect (meaning this test can be used) for the duration of  the COVID-19 declaration under Section 564(b)(1) of the Act, 21 U.S.C. section 360bbb-3(b)(1), unless the a uthorization is terminated or revoked sooner.   Blood Culture (routine x 2)     Status: None (Preliminary result)   Collection Time: 12/27/18  2:21 PM   Specimen: BLOOD RIGHT HAND  Result Value Ref Range   Specimen Description BLOOD RIGHT HAND    Special Requests      BOTTLES DRAWN AEROBIC AND ANAEROBIC Blood Culture adequate volume   Culture  NO GROWTH 1 DAY Performed at [facility], [place] [address], [place], [place] [address]    Report Status PENDING   CBG monitoring, ED     Status: Abnormal   Collection Time: 12/27/18  7:48 PM  Result Value Ref Range   Glucose-Capillary 193 (H) 70 - 99 mg/dL  CBG monitoring, ED     Status: Abnormal   Collection Time: 12/27/18 10:07 PM  Result Value Ref Range   Glucose-Capillary 174 (H) 70 - 99 mg/dL  Urinalysis, Routine w reflex microscopic     Status: Abnormal   Collection Time: 12/28/18  3:00 AM  Result Value Ref Range   Color, Urine YELLOW YELLOW   APPearance HAZY (A) CLEAR   Specific Gravity, Urine 1.019 1.005 - 1.030   pH 5.0 5.0 - 8.0   Glucose, UA NEGATIVE NEGATIVE mg/dL   Hgb urine dipstick SMALL (A) NEGATIVE   Bilirubin Urine NEGATIVE NEGATIVE   Ketones, ur NEGATIVE NEGATIVE mg/dL    Protein, ur 30 (A) NEGATIVE mg/dL   Nitrite NEGATIVE NEGATIVE   Leukocytes,Ua NEGATIVE NEGATIVE   RBC / HPF 0-5 0 - 5 RBC/hpf   WBC, UA 0-5 0 - 5 WBC/hpf   Bacteria, UA NONE SEEN NONE SEEN    Comment: Performed at [facility], [address] [place], [place] [address]  HIV Antibody (routine testing w rflx)     Status: None   Collection Time: 12/28/18  3:25 AM  Result Value Ref Range   HIV Screen 4th Generation wRfx NON REACTIVE NON REACTIVE    Comment: Performed at [facility] [address] [place], [place] [address]  CBC with Differential/Platelet     Status: Abnormal   Collection Time: 12/28/18  3:25 AM  Result Value Ref Range   WBC 4.1 4.0 - 10.5 K/uL   RBC 3.97 (L) 4.22 - 5.81 MIL/uL   Hemoglobin 11.8 (L) 13.0 - 17.0 g/dL   HCT 38.1 (L) 39.0 - 52.0 %   MCV 96.0 80.0 - 100.0 fL   MCH 29.7 26.0 - 34.0 pg   MCHC 31.0 30.0 - 36.0 g/dL   RDW 14.5 11.5 - 15.5 %   Platelets 99 (L) 150 - 400 K/uL    Comment: REPEATED TO VERIFY Immature Platelet Fraction may be clinically indicated, consider ordering this additional test [id number] CONSISTENT WITH PREVIOUS RESULT    nRBC 0.0 0.0 - 0.2 %   Neutrophils Relative % 78 %   Neutro Abs 3.3 1.7 - 7.7 K/uL   Lymphocytes Relative 11 %   Lymphs Abs 0.5 (L) 0.7 - 4.0 K/uL   Monocytes Relative 9 %   Monocytes Absolute 0.4 0.1 - 1.0 K/uL   Eosinophils Relative 1 %   Eosinophils Absolute 0.0 0.0 - 0.5 K/uL   Basophils Relative 0 %   Basophils Absolute 0.0 0.0 - 0.1 K/uL   Immature Granulocytes 1 %   Abs Immature Granulocytes 0.02 0.00 - 0.07 K/uL    Comment: Performed at [facility], [place] [address], [place],  [address]  Comprehensive metabolic panel     Status: Abnormal   Collection Time: 12/28/18  3:25 AM  Result Value Ref Range   Sodium 141 135 - 145 mmol/L   Potassium 5.7 (H) 3.5 - 5.1 mmol/L   Chloride 113 (H) 98 - 111 mmol/L   CO2 18 (L) 22 - 32 mmol/L   Glucose, Bld 158 (H) 70 - 99 mg/dL   BUN  78 (  H) 8 - 23 mg/dL   Creatinine, Ser 3.25 (H) 0.61 - 1.24 mg/dL   Calcium 8.3 (L) 8.9 - 10.3 mg/dL   Total Protein 6.6 6.5 - 8.1 g/dL   Albumin 3.0 (L) 3.5 - 5.0 g/dL   AST 61 (H) 15 - 41 U/L   ALT 65 (H) 0 - 44 U/L   Alkaline Phosphatase 53 38 - 126 U/L   Total Bilirubin 0.5 0.3 - 1.2 mg/dL   GFR calc non Af Amer 19 (L) >60 mL/min   GFR calc Af Amer 22 (L) >60 mL/min   Anion gap 10 5 - 15    Comment: Performed at [place] [address] [place], [place] [address]  C-reactive protein     Status: Abnormal   Collection Time: 12/28/18  3:25 AM  Result Value Ref Range   CRP 8.3 (H) <1.0 mg/dL    Comment: Performed at [facility] [address] [place], [place] [address]  D-dimer, quantitative (not at [facility])     Status: Abnormal   Collection Time: 12/28/18  3:25 AM  Result Value Ref Range   D-Dimer, Quant 2.11 (H) 0.00 - 0.50 ug/mL-FEU    Comment: (NOTE) At the manufacturer cut-off of 0.50 ug/mL FEU, this assay has been documented to exclude PE with a sensitivity and negative predictive value of 97 to 99%.  At this time, this assay has not been approved by the FDA to exclude DVT/VTE. Results should be correlated with clinical presentation. Performed at [facility], [place] [address] [place], [place] [address]   Ferritin     Status: Abnormal   Collection Time: 12/28/18  3:25 AM  Result Value Ref Range   Ferritin 611 (H) 24 - 336 ng/mL    Comment: Performed at [facility], [place] [address] [place], [place] [address]  Magnesium     Status: None   Collection Time: 12/28/18  3:25 AM  Result Value Ref Range   Magnesium 2.2 1.7 - 2.4 mg/dL    Comment: Performed at [facility], [place] [address], [place], [place] [address]  Lactate dehydrogenase     Status: Abnormal   Collection Time: 12/28/18  3:25 AM  Result Value Ref Range   LDH 265 (H) 98 - 192 U/L    Comment: Performed at [facility], [place] [address] [place], [place] [address]   Hemoglobin A1c     Status: Abnormal   Collection Time: 12/28/18  3:25 AM  Result Value Ref Range   Hgb A1c MFr Bld 7.8 (H) 4.8 - 5.6 %    Comment: (NOTE) Pre diabetes:          5.7%-6.4% Diabetes:              >6.4% Glycemic control for   <7.0% adults with diabetes    Mean Plasma Glucose 177.16 mg/dL    Comment: Performed at [facility] [place] [address] [place], [place] [address]  Fibrinogen     Status: Abnormal   Collection Time: 12/28/18  3:25 AM  Result Value Ref Range   Fibrinogen 615 (H) 210 - 475 mg/dL    Comment: Performed at [place] [address] [place], [place] [address]  CBG monitoring, ED     Status: Abnormal   Collection Time: 12/28/18  8:14 AM  Result Value Ref Range   Glucose-Capillary 137 (H) 70 - 99 mg/dL  C Difficile Quick Screen  w PCR reflex     Status: None   Collection Time: 12/28/18  9:11 AM   Specimen: Stool  Result Value Ref Range   C Diff antigen NEGATIVE NEGATIVE   C Diff toxin NEGATIVE NEGATIVE   C Diff interpretation No C. difficile detected.     Comment: Performed at [facility], [place] [address] [place], [place] [address]  CBG monitoring, ED     Status: Abnormal   Collection Time: 12/28/18 12:14 PM  Result Value Ref Range   Glucose-Capillary 154 (H) 70 - 99 mg/dL   Comment 1 Notify RN    Comment 2 Document in Chart   CBG monitoring, ED     Status: Abnormal   Collection Time: 12/28/18  4:54 PM  Result Value Ref Range   Glucose-Capillary 142 (H) 70 - 99 mg/dL   Comment 1 Notify RN    Comment 2 Document in Chart    US RENAL  Result Date: 12/27/2018 CLINICAL DATA:  Acute renal failure. EXAM: RENAL / URINARY TRACT ULTRASOUND COMPLETE COMPARISON:  CT scan of the abdomen dated 11/15/2017 FINDINGS: Right Kidney: Renal measurements: 8.3 x 4.1 x 3.6 cm = volume: 64 mL. Slight increased echogenicity of the renal parenchyma with thinning of the renal cortex. No hydronephrosis. Left Kidney: Renal measurements: 7.5 x 4.4 x 3.5  cm = volume: 61 mL. Slight increased echogenicity of the renal parenchyma with thinning of the renal cortex. No mass or hydronephrosis visualized. Bladder: Appears normal for degree of bladder distention. Other: None. IMPRESSION: Thinning of the renal cortices with echogenic renal parenchyma consistent with renal medical disease. No obstruction. Electronically Signed   By: Lorriane Shire M.D.   On: 12/27/2018 18:05   DG Chest Portable 1 View  Result Date: 12/27/2018 CLINICAL DATA:  Shortness of breath.  COVID. EXAM: PORTABLE CHEST 1 VIEW COMPARISON:  06/18/2017 FINDINGS: Loop recorder projects over the left heart. Mild cardiomegaly. No confluent opacities, effusions or edema. No acute bony abnormality. IMPRESSION: Cardiomegaly.  No acute cardiopulmonary disease. Electronically Signed   By: Rolm Baptise M.D.   On: 12/27/2018 14:06    Pending Labs Unresulted Labs (From admission, onward)    Start     Ordered   12/28/18 0936  GI pathogen panel by PCR, stool  (Gastrointestinal Panel by PCR, Stool                                                                                                                                                     *Does Not include CLOSTRIDIUM DIFFICILE testing.**If CDIFF testing is needed, select the C Difficile Quick Screen w PCR reflex order below)  Once,   STAT     12/28/18 0936   12/28/18 0500  CBC with Differential/Platelet  Daily,   R     12/27/18 1622   12/28/18  0500  Comprehensive metabolic panel  Daily,   R     12/27/18 1622   12/28/18 0500  C-reactive protein  Daily,   R     12/27/18 1622   12/28/18 0500  D-dimer, quantitative (not at [facility])  Daily,   R     12/27/18 1622   12/28/18 0500  Ferritin  Daily,   R     12/27/18 1622   12/28/18 0500  Magnesium  Daily,   R     12/27/18 1622   12/28/18 0500  Lactate dehydrogenase  Daily,   R     12/27/18 1622   12/28/18 0500  Tacrolimus level  Tomorrow morning,   R     12/27/18 1622           Vitals/Pain Today's Vitals   12/28/18 1615 12/28/18 1644 12/28/18 1700 12/28/18 1750  BP:   (!) 155/94   Pulse: 95  75   Resp: (!) 32  (!) 29   Temp:      TempSrc:      SpO2: 95%  95%   Weight:      Height:      PainSc:  Asleep  0-No pain    Isolation Precautions Enteric precautions (UV disinfection)  Medications Medications  sodium chloride flush (NS) 0.9 % injection 3 mL (3 mLs Intravenous Not Given 12/28/18 0004)  aspirin EC tablet 81 mg (81 mg Oral Given 12/28/18 0922)  HYDROcodone-acetaminophen (NORCO) 10-325 MG per tablet 1 tablet (has no administration in time range)  atorvastatin (LIPITOR) tablet 40 mg (40 mg Oral Given 12/28/18 1740)  labetalol (NORMODYNE) tablet 100 mg (100 mg Oral Given 12/28/18 0921)  methimazole (TAPAZOLE) tablet 2.5 mg (2.5 mg Oral Given 12/28/18 0919)  tamsulosin (FLOMAX) capsule 0.4 mg (0.4 mg Oral Given 12/27/18 2221)  mycophenolate (MYFORTIC) EC tablet 180 mg (180 mg Oral Given 12/28/18 0917)  tacrolimus (PROGRAF) capsule 2 mg (2 mg Oral Given 12/28/18 0916)  gabapentin (NEURONTIN) capsule 300 mg (300 mg Oral Given 12/28/18 0922)  heparin injection 5,000 Units (5,000 Units Subcutaneous Given 12/28/18 1342)  0.9 %  sodium chloride infusion ( Intravenous New Bag/Given 12/28/18 1120)  Ipratropium-Albuterol (COMBIVENT) respimat 1 puff (has no administration in time range)  acetaminophen (TYLENOL) tablet 650 mg (has no administration in time range)  ondansetron (ZOFRAN) tablet 4 mg (has no administration in time range)    Or  ondansetron (ZOFRAN) injection 4 mg (has no administration in time range)  insulin aspart (novoLOG) injection 0-15 Units (2 Units Subcutaneous Given 12/28/18 1741)  insulin aspart (novoLOG) injection 0-5 Units (0 Units Subcutaneous Not Given 12/27/18 2211)  insulin glargine (LANTUS) injection 15 Units (15 Units Subcutaneous Given 12/28/18 0010)  dexamethasone (DECADRON) injection 8 mg (8 mg Intravenous Given 12/28/18  1740)  acetaminophen (TYLENOL) tablet 1,000 mg (1,000 mg Oral Given 12/27/18 1423)  ondansetron (ZOFRAN) injection 4 mg (4 mg Intravenous Given 12/27/18 1422)  sodium chloride 0.9 % bolus 500 mL (0 mLs Intravenous Stopped 12/27/18 1750)  sodium chloride 0.9 % bolus 500 mL (0 mLs Intravenous Stopped 12/27/18 1750)    Mobility walks with device Low fall risk   Focused Assessments Pulmonary Assessment Handoff:  Lung sounds: Bilateral Breath Sounds: Clear O2 Device: Nasal Cannula O2 Flow Rate (L/min): 2 L/min      R Recommendations: See Admitting Provider Note  Report given to:   Additional Notes:  Jon [name] is a 68 y.o. male with med history significant of CVA, chronic kidney disease stage V (  has a left upper extremity AV fistula but has never required dialysis) status post renal transplant, hypertension, diabetes mellitus type 2, hyperlipidemia, chronic back pain, CHF, obstructive sleep apnea on CPAP at night presented with complaints of not feeling well.  Patient states that he was diagnosed Covid positive 8 days ago and since then he has not felt well.  Creatinine of 3.42.  COVID-19 test was positive.  Chest x-ray was negative.  CRP was 7.    Pt currently on room air.  Denies any SHOB.

## 2018-12-29 LAB — CBC WITH DIFFERENTIAL/PLATELET
Abs Immature Granulocytes: 0.02 10*3/uL (ref 0.00–0.07)
Basophils Absolute: 0 10*3/uL (ref 0.0–0.1)
Basophils Relative: 0 %
Eosinophils Absolute: 0 10*3/uL (ref 0.0–0.5)
Eosinophils Relative: 0 %
HCT: 35.8 % — ABNORMAL LOW (ref 39.0–52.0)
Hemoglobin: 11.6 g/dL — ABNORMAL LOW (ref 13.0–17.0)
Immature Granulocytes: 1 %
Lymphocytes Relative: 4 %
Lymphs Abs: 0.2 10*3/uL — ABNORMAL LOW (ref 0.7–4.0)
MCH: 29.7 pg (ref 26.0–34.0)
MCHC: 32.4 g/dL (ref 30.0–36.0)
MCV: 91.8 fL (ref 80.0–100.0)
Monocytes Absolute: 0.2 10*3/uL (ref 0.1–1.0)
Monocytes Relative: 5 %
Neutro Abs: 3.8 10*3/uL (ref 1.7–7.7)
Neutrophils Relative %: 90 %
Platelets: 108 10*3/uL — ABNORMAL LOW (ref 150–400)
RBC: 3.9 MIL/uL — ABNORMAL LOW (ref 4.22–5.81)
RDW: 13.8 % (ref 11.5–15.5)
WBC: 4.1 10*3/uL (ref 4.0–10.5)
nRBC: 0 % (ref 0.0–0.2)

## 2018-12-29 LAB — COMPREHENSIVE METABOLIC PANEL
ALT: 60 U/L — ABNORMAL HIGH (ref 0–44)
AST: 53 U/L — ABNORMAL HIGH (ref 15–41)
Albumin: 2.8 g/dL — ABNORMAL LOW (ref 3.5–5.0)
Alkaline Phosphatase: 50 U/L (ref 38–126)
Anion gap: 10 (ref 5–15)
BUN: 62 mg/dL — ABNORMAL HIGH (ref 8–23)
CO2: 13 mmol/L — ABNORMAL LOW (ref 22–32)
Calcium: 8.6 mg/dL — ABNORMAL LOW (ref 8.9–10.3)
Chloride: 119 mmol/L — ABNORMAL HIGH (ref 98–111)
Creatinine, Ser: 2.69 mg/dL — ABNORMAL HIGH (ref 0.61–1.24)
GFR calc Af Amer: 27 mL/min — ABNORMAL LOW (ref >60)
GFR calc non Af Amer: 23 mL/min — ABNORMAL LOW (ref >60)
Glucose, Bld: 254 mg/dL — ABNORMAL HIGH (ref 70–99)
Potassium: 5.8 mmol/L — ABNORMAL HIGH (ref 3.5–5.1)
Sodium: 142 mmol/L (ref 135–145)
Total Bilirubin: 0.7 mg/dL (ref 0.3–1.2)
Total Protein: 6.2 g/dL — ABNORMAL LOW (ref 6.5–8.1)

## 2018-12-29 LAB — GLUCOSE, CAPILLARY
Glucose-Capillary: 252 mg/dL — ABNORMAL HIGH (ref 70–99)
Glucose-Capillary: 233 mg/dL — ABNORMAL HIGH (ref 70–99)
Glucose-Capillary: 227 mg/dL — ABNORMAL HIGH (ref 70–99)
Glucose-Capillary: 194 mg/dL — ABNORMAL HIGH (ref 70–99)

## 2018-12-29 LAB — FERRITIN: Ferritin: 623 ng/mL — ABNORMAL HIGH (ref 24–336)

## 2018-12-29 LAB — LACTATE DEHYDROGENASE: LDH: 301 U/L — ABNORMAL HIGH (ref 98–192)

## 2018-12-29 LAB — D-DIMER, QUANTITATIVE: D-Dimer, Quant: 2.54 ug/mL-FEU — ABNORMAL HIGH (ref 0.00–0.50)

## 2018-12-29 LAB — MAGNESIUM: Magnesium: 2.1 mg/dL (ref 1.7–2.4)

## 2018-12-29 LAB — C-REACTIVE PROTEIN: CRP: 9.7 mg/dL — ABNORMAL HIGH (ref <1.0)

## 2018-12-29 LAB — TACROLIMUS LEVEL: Tacrolimus (FK506) - LabCorp: 19 ng/mL (ref 2.0–20.0)

## 2018-12-29 MED ORDER — BOOST / RESOURCE BREEZE PO LIQD CUSTOM
1.0000 | Freq: Two times a day (BID) | ORAL | Status: DC
  Administered 2018-12-29 – 2018-12-30 (×2): 1 via ORAL

## 2018-12-29 MED ORDER — INSULIN GLARGINE 100 UNIT/ML ~~LOC~~ SOLN
20.0000 [IU] | Freq: Every day | SUBCUTANEOUS | Status: DC
  Administered 2018-12-29 – 2018-12-30 (×2): 20 [IU] via SUBCUTANEOUS
  Filled 2018-12-29 (×3): qty 0.2

## 2018-12-29 MED ORDER — PREDNISONE 5 MG PO TABS
5.0000 mg | ORAL_TABLET | Freq: Every day | ORAL | Status: DC
  Administered 2018-12-30 – 2018-12-31 (×2): 5 mg via ORAL
  Filled 2018-12-29 (×2): qty 1

## 2018-12-29 MED ORDER — ENSURE ENLIVE PO LIQD
237.0000 mL | Freq: Two times a day (BID) | ORAL | Status: DC
  Administered 2018-12-29 – 2018-12-31 (×3): 237 mL via ORAL

## 2018-12-29 MED ORDER — SODIUM POLYSTYRENE SULFONATE 15 GM/60ML PO SUSP
15.0000 g | Freq: Once | ORAL | Status: AC
  Administered 2018-12-29: 15 g via ORAL
  Filled 2018-12-29: qty 60

## 2018-12-29 MED ORDER — ENSURE ENLIVE PO LIQD: 237.0000 mL | Freq: Two times a day (BID) | ORAL | Status: DC

## 2018-12-29 NOTE — Progress Notes (Signed)
Hospitalist daily note   YER OLIVENCIA [id number] DOB: Nov 07, 1950 DOA: 12/27/2018  PCP: [name], MD   Narrative:  89 wm CKD 5 LUE AV fistula live donor transplant 10/04/2013 CMV negative recipient positive (baseline creatinine 1.8-2.3), CVA 03/2013-ILR placed 03/19/2013 DM TY 2 A1c, HTN pulmonary HTN followed by cardiology Nehemiah Massed ascending aortic aneurysm thyroidectomy benign follicular nodule 2353, multiple back surgeries-sleep apnea on CPAP Diagnosed coronavirus ~12/20/2018 main symptoms nausea vomiting-myalgia mild S OB Found to have AKI and admitted  Data Reviewed:  Creatinine 3.4-->78/3.2--62/2.6 Potassium 5.7-->5.8, CO2 18 -->13 Ferritin 585-->611-->623 CRP 7.0-->8.3-->9.7 Procalcitonin 0.22-- Dimer 1.86-->2.11--2.54 Fibrinogen 698-->650 Hemoglobin 11.8-->11.6  platelet down from baseline in the past 120s 130s to 99-->109 Assessment & Plan: Coronavirus 19  infection without pneumonia No resp symptoms-started Decadron but switched back on 12/19 to usual home prednisone dose Was placed on Oxygen overnight 2/2 need for night CPAP Seems to be on room air at this time Coronavirus related diarrhea Cut back NS 100 cc/H-->50 --Cdiff neg-await gi path--likely covid ESRD stage IV/V live donor transplant 09/2013 Baseline creatinine 1.8-2.3 continue Myfortic 180 twice daily tacrolimus to twice daily With hold Bactrim 1 tab Monday Wednesday Friday for now Resume in 1 week with OP labs Cryptogenic 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 with ILR Continue aspirin 81 Pulmonary HTN Careful with fluid resuscitation strategy- Lasix 40 held Can continue labetalol 100 twice daily DM TY 2 A1c 7.8 with ESRD and nephropathy as well as neuropathy CBGs 175-252 Increase Lantus 15 units-->20 (home dose 24), moderate sliding scale (home meds 5 to 15 units) Home meds include Tradjenta as 5 daily which will be resumed Continue gabapentin 300 twice daily Thyroidectomy Continue Tapazole 2.5 mg daily Reflux Continue  pantoprazole 40 daily LUTS Continue Flomax 0.4 at bedtime  Heparin prophylactic dosing, inpatient stay at [facility],  expect discharge if no oxygen requirement in 2 to 3 days  Subjective: Awake coherent in nad no focal deficit Still with diarr multiple episodes yesterday no fever No cp no feve rno chills no n v Antimicrobials:     Objective: Vitals:   12/29/18 0010 12/29/18 0400 12/29/18 0504 12/29/18 0507  BP:  (!) 144/64 (!) 157/68   Pulse: 64 67    Resp: 17   15  Temp:      TempSrc:      SpO2: 96% 91% 94%   Weight:      Height:        Intake/Output Summary (Last 24 hours) at 12/29/2018 0817 Last data filed at 12/29/2018 0753 Gross per 24 hour  Intake 670 ml  Output 975 ml  Net -305 ml   Filed Weights   12/27/18 1327  Weight: 104.8 kg    Examination: Awake coherent in na dno focal deficit  eomi ncat  abd obese nt nd no rbeound No le edema Thick neck Neuro intac tmoving limbs x 4 without deficit  Scheduled Meds: . aspirin EC  81 mg Oral Daily  . atorvastatin  40 mg Oral q1800  . dexamethasone (DECADRON) injection  8 mg Intravenous Q24H  . feeding supplement (ENSURE ENLIVE)  237 mL Oral BID BM  . gabapentin  300 mg Oral BID  . heparin  5,000 Units Subcutaneous Q8H  . insulin aspart  0-15 Units Subcutaneous TID WC  . insulin aspart  0-5 Units Subcutaneous QHS  . insulin glargine  15 Units Subcutaneous QHS  . labetalol  100 mg Oral BID  . methimazole  2.5 mg Oral Daily  . mycophenolate  180 mg Oral BID  . sodium chloride flush  3 mL Intravenous Once  . tacrolimus  2 mg Oral BID  . tamsulosin  0.4 mg Oral QHS   Continuous Infusions: . sodium chloride 100 mL/hr at 12/29/18 0715     LOS: 2 days   Time spent: [place], MD [facility]

## 2018-12-29 NOTE — Progress Notes (Signed)
Initial Nutrition Assessment  DOCUMENTATION CODES:   Obesity unspecified  INTERVENTION:  -Boost Breeze po BID, each supplement provides 250 kcal and 9 grams of protein  -Liberalize diet  Continue Ensure Enlive po BID, each supplement provides 350 kcal and 20 grams of protein  NUTRITION DIAGNOSIS:   Increased nutrient needs related to acute illness, chronic illness(Covid infection; CKD V not on HD s/p 2015 renal transplant) as evidenced by estimated needs(multiple episodes of loose stools daily).  GOAL:   Patient will meet greater than or equal to 90% of their needs   MONITOR:   PO intake, Weight trends, Supplement acceptance, I & O's, Labs  REASON FOR ASSESSMENT:   Malnutrition Screening Tool    ASSESSMENT:  RD working remotely.  68 year old male with past medical history of unspecified CVA, CKDV not on HD; left AV fistula in place s/p renal transplant, HTN, T2DM, HLD, CHF, OSA on CPAP at night and COVID positive on 12/10. Patient presented to ED with complaints of not feeling well s/p diagnosis and reports worsening watery diarrhea, decreased appetite and some nausea. In ED pt Cr 3.42, Covid-19 positive, CRP 7 and started on IV fluids.  Patient admitted with AKI on CKD IIIb in patient with h/o CKD V s/p renal transplantation (2015) on chronic immunosuppression likely secondary to dehydration from diarrhea.   RD received busy signal on 2 attempts at contacting patient today. Patient documented with 10% intake of 1 meal today. Per chart, patient with ongoing diarrhea; multiple episodes yesterday. He is provided Ensure twice daily. RD will order Boost Breeze as it may be better tolerated at this time and recommend regular diet to encourage po intake.  I/Os: +570 since admit    -430 ml x 24 hrs UOP: 1100 ml x 24 hrs Current wt 104.8 kg (230.6 lbs) Patient noted with 20 lb (8%) wt loss in 2 months which is severe for time frame. Per weight history, pt UBW 100.2 kg - 107 kg over  the past year. On 10/14 pt wt 113.9 kg (250.6 lbs) which is 15 lbs above his usual. Possible wt entry error at that time.  Medications reviewed and include: Gabapentin, SS novolog, Lantus, Prednisone  Labs: CBGs 142-252 x 24 hrs, K 5.8 (H), BUN 62 (H), Cr 2.69 (H), LDH 301 (H), CRP 9.7 (H)  NUTRITION - FOCUSED PHYSICAL EXAM: Unable to complete at this time, RD working remotely.  Diet Order:   Diet Order            Diet heart healthy/carb modified Room service appropriate? Yes; Fluid consistency: Thin  Diet effective now              EDUCATION NEEDS:   No education needs have been identified at this time  Skin:  Skin Assessment: Reviewed RN Assessment  Last BM:  12/19 (type 7)  Height:   Ht Readings from Last 1 Encounters:  12/27/18 5\' 11"  (1.803 m)    Weight:   Wt Readings from Last 1 Encounters:  12/27/18 104.8 kg    Ideal Body Weight:  78.2 kg  BMI:  Body mass index is 32.22 kg/m.  Estimated Nutritional Needs:   Kcal:  2200-2400  Protein:  110-120  Fluid:  >/= 2.2 L/day   Lajuan Lines, RD, LDN Clinical Nutrition [facility] Telephone [phone] After Hours/Weekend Pager: [phone]

## 2018-12-30 LAB — D-DIMER, QUANTITATIVE: D-Dimer, Quant: 2.07 ug/mL-FEU — ABNORMAL HIGH (ref 0.00–0.50)

## 2018-12-30 LAB — CBC WITH DIFFERENTIAL/PLATELET
Abs Immature Granulocytes: 0.05 10*3/uL (ref 0.00–0.07)
Basophils Absolute: 0 10*3/uL (ref 0.0–0.1)
Basophils Relative: 0 %
Eosinophils Absolute: 0 10*3/uL (ref 0.0–0.5)
Eosinophils Relative: 0 %
HCT: 36 % — ABNORMAL LOW (ref 39.0–52.0)
Hemoglobin: 11.9 g/dL — ABNORMAL LOW (ref 13.0–17.0)
Immature Granulocytes: 1 %
Lymphocytes Relative: 3 %
Lymphs Abs: 0.3 10*3/uL — ABNORMAL LOW (ref 0.7–4.0)
MCH: 30.4 pg (ref 26.0–34.0)
MCHC: 33.1 g/dL (ref 30.0–36.0)
MCV: 91.8 fL (ref 80.0–100.0)
Monocytes Absolute: 0.4 10*3/uL (ref 0.1–1.0)
Monocytes Relative: 4 %
Neutro Abs: 8.1 10*3/uL — ABNORMAL HIGH (ref 1.7–7.7)
Neutrophils Relative %: 92 %
Platelets: 127 10*3/uL — ABNORMAL LOW (ref 150–400)
RBC: 3.92 MIL/uL — ABNORMAL LOW (ref 4.22–5.81)
RDW: 13.9 % (ref 11.5–15.5)
WBC: 8.8 10*3/uL (ref 4.0–10.5)
nRBC: 0 % (ref 0.0–0.2)

## 2018-12-30 LAB — MAGNESIUM: Magnesium: 1.9 mg/dL (ref 1.7–2.4)

## 2018-12-30 LAB — COMPREHENSIVE METABOLIC PANEL
ALT: 50 U/L — ABNORMAL HIGH (ref 0–44)
AST: 36 U/L (ref 15–41)
Albumin: 2.6 g/dL — ABNORMAL LOW (ref 3.5–5.0)
Alkaline Phosphatase: 45 U/L (ref 38–126)
Anion gap: 7 (ref 5–15)
BUN: 53 mg/dL — ABNORMAL HIGH (ref 8–23)
CO2: 16 mmol/L — ABNORMAL LOW (ref 22–32)
Calcium: 8.4 mg/dL — ABNORMAL LOW (ref 8.9–10.3)
Chloride: 124 mmol/L — ABNORMAL HIGH (ref 98–111)
Creatinine, Ser: 1.97 mg/dL — ABNORMAL HIGH (ref 0.61–1.24)
GFR calc Af Amer: 40 mL/min — ABNORMAL LOW (ref >60)
GFR calc non Af Amer: 34 mL/min — ABNORMAL LOW (ref >60)
Glucose, Bld: 215 mg/dL — ABNORMAL HIGH (ref 70–99)
Potassium: 5.3 mmol/L — ABNORMAL HIGH (ref 3.5–5.1)
Sodium: 147 mmol/L — ABNORMAL HIGH (ref 135–145)
Total Bilirubin: 0.4 mg/dL (ref 0.3–1.2)
Total Protein: 5.8 g/dL — ABNORMAL LOW (ref 6.5–8.1)

## 2018-12-30 LAB — GLUCOSE, CAPILLARY
Glucose-Capillary: 208 mg/dL — ABNORMAL HIGH (ref 70–99)
Glucose-Capillary: 155 mg/dL — ABNORMAL HIGH (ref 70–99)
Glucose-Capillary: 185 mg/dL — ABNORMAL HIGH (ref 70–99)
Glucose-Capillary: 178 mg/dL — ABNORMAL HIGH (ref 70–99)

## 2018-12-30 LAB — C-REACTIVE PROTEIN: CRP: 3.4 mg/dL — ABNORMAL HIGH (ref <1.0)

## 2018-12-30 LAB — LACTATE DEHYDROGENASE: LDH: 278 U/L — ABNORMAL HIGH (ref 98–192)

## 2018-12-30 LAB — FERRITIN: Ferritin: 803 ng/mL — ABNORMAL HIGH (ref 24–336)

## 2018-12-30 MED ORDER — LOPERAMIDE HCL 2 MG PO CAPS
2.0000 mg | ORAL_CAPSULE | Freq: Two times a day (BID) | ORAL | Status: DC
  Administered 2018-12-30 – 2018-12-31 (×2): 2 mg via ORAL
  Filled 2018-12-30 (×2): qty 1

## 2018-12-30 NOTE — Progress Notes (Signed)
Hospitalist daily note   Jon [name] [id number] DOB: [birth year] DOA: 12/27/2018  PCP: [name], MD   Narrative:  85 wm CKD 5 LUE AV fistula live donor transplant 10/04/2013 CMV negative recipient positive (baseline creatinine 1.8-2.3), CVA 03/2013-ILR placed 03/19/2013 DM TY 2 A1c, HTN pulmonary HTN followed by cardiology Nehemiah Massed ascending aortic aneurysm thyroidectomy benign follicular nodule 2778, multiple back surgeries-sleep apnea on CPAP Diagnosed coronavirus ~12/20/2018 main symptoms nausea vomiting-myalgia mild S OB Found to have AKI and admitted  Data Reviewed:  Creatinine 3.4-->78/3.2--62/2.6-->53/1.9 Potassium 5.7-->5.8-->5.3, CO2 18 -->13-->16 Ferritin 585-->611-->623-->803 CRP 7.0-->8.3-->9.7-->3.4 Ldh-->270-->265-->301-->278 Procalcitonin 0.22-- Dimer 1.86-->2.11--2.54-->2.07 Fibrinogen 698-->650 Hemoglobin 11.8-->11.6  platelet down from baseline in the past 120s 130s to 99-->109 127 Assessment & Plan: Coronavirus 19  infection without pneumonia No resp symptoms-started Decadron but switched back on 12/19 to usual home prednisone dose Was placed on Oxygen overnight 2/2 need for night CPAP Seems to be on room air at this time Coronavirus related diarrhea Cut back NS 100 cc/H-->50 but will continue fluids given he has been having about 7 stools over the past 24 hours Cdiff neg-await gi path--likely covid related-we will attempt Imodium if patient amenable he can have yogurt which will help replace his beneficial gut flora ESRD stage IV/V live donor transplant 09/2013 Baseline creatinine 1.8-2.3 continue Myfortic 180 twice daily tacrolimus to twice daily With hold Bactrim 1 tab Monday Wednesday Friday for now Resume in 1 week with OP labs Cryptogenic CVA 03/2013 with ILR Continue aspirin 81 Pulmonary HTN Careful with fluid resuscitation strategy- Lasix 40 held Can continue labetalol 100 twice daily DM TY 2 A1c 7.8 with ESRD and nephropathy as well as  neuropathy CBGs 194-208 Continue Lantus 20 (home dose 24), moderate sliding scale (home meds 5 to 15 units) Home meds include Tradjenta as 5 daily which will be resumed Continue gabapentin 300 twice daily Thyroidectomy Continue Tapazole 2.5 mg daily Reflux Continue pantoprazole 40 daily LUTS Continue Flomax 0.4 at bedtime  Heparin prophylactic dosing, inpatient stay at [facility],  --> Called patient's wife Herbert Pun and updated decided  Subjective: About 7 stools yesterday Flexi-Seal placed overnight no shortness of breath or other respiratory symptoms otherwise feeling fair no abdominal pain Eating a little better Antimicrobials:     Objective: Vitals:   12/30/18 0428 12/30/18 0530 12/30/18 0605 12/30/18 0757  BP: (!) 146/81 (!) 146/81  (!) 161/80  Pulse:  74 76 75  Resp:  19 (!) 25 (!) 22  Temp: 98.5 F (36.9 C)   98.3 F (36.8 C)  TempSrc:    Oral  SpO2:  94% 96% 95%  Weight:      Height:        Intake/Output Summary (Last 24 hours) at 12/30/2018 1131 Last data filed at 12/30/2018 2423 Gross per 24 hour  Intake 1595.83 ml  Output 250 ml  Net 1345.83 ml   Filed Weights   12/27/18 1327  Weight: 104.8 kg    Examination: No focal deficit EOMI NCAT Decreased air entry bilaterally eomi ncat  abd obese nt nd no rebound or guarding No le edema Thick neck Neuro intac tmoving limbs x 4 without deficit  Scheduled Meds: . aspirin EC  81 mg Oral Daily  . atorvastatin  40 mg Oral q1800  . feeding supplement  1 Container Oral BID  . feeding supplement (ENSURE ENLIVE)  237 mL Oral BID BM  . gabapentin  300 mg Oral BID  . heparin  5,000 Units Subcutaneous Q8H  .  insulin aspart  0-15 Units Subcutaneous TID WC  . insulin aspart  0-5 Units Subcutaneous QHS  . insulin glargine  20 Units Subcutaneous QHS  . labetalol  100 mg Oral BID  . methimazole  2.5 mg Oral Daily  . mycophenolate  180 mg Oral BID  . predniSONE  5 mg Oral Q breakfast  . sodium chloride flush  3 mL  Intravenous Once  . tacrolimus  2 mg Oral BID  . tamsulosin  0.4 mg Oral QHS   Continuous Infusions:    LOS: 3 days   Time spent: [place], MD [facility]

## 2018-12-31 LAB — CBC WITH DIFFERENTIAL/PLATELET
Abs Immature Granulocytes: 0.07 10*3/uL (ref 0.00–0.07)
Basophils Absolute: 0 10*3/uL (ref 0.0–0.1)
Basophils Relative: 0 %
Eosinophils Absolute: 0 10*3/uL (ref 0.0–0.5)
Eosinophils Relative: 0 %
HCT: 38.3 % — ABNORMAL LOW (ref 39.0–52.0)
Hemoglobin: 12.4 g/dL — ABNORMAL LOW (ref 13.0–17.0)
Immature Granulocytes: 1 %
Lymphocytes Relative: 2 %
Lymphs Abs: 0.2 10*3/uL — ABNORMAL LOW (ref 0.7–4.0)
MCH: 29.8 pg (ref 26.0–34.0)
MCHC: 32.4 g/dL (ref 30.0–36.0)
MCV: 92.1 fL (ref 80.0–100.0)
Monocytes Absolute: 0.4 10*3/uL (ref 0.1–1.0)
Monocytes Relative: 4 %
Neutro Abs: 8.8 10*3/uL — ABNORMAL HIGH (ref 1.7–7.7)
Neutrophils Relative %: 93 %
Platelets: 135 10*3/uL — ABNORMAL LOW (ref 150–400)
RBC: 4.16 MIL/uL — ABNORMAL LOW (ref 4.22–5.81)
RDW: 14 % (ref 11.5–15.5)
WBC: 9.4 10*3/uL (ref 4.0–10.5)
nRBC: 0 % (ref 0.0–0.2)

## 2018-12-31 LAB — COMPREHENSIVE METABOLIC PANEL
ALT: 45 U/L — ABNORMAL HIGH (ref 0–44)
AST: 31 U/L (ref 15–41)
Albumin: 2.8 g/dL — ABNORMAL LOW (ref 3.5–5.0)
Alkaline Phosphatase: 46 U/L (ref 38–126)
Anion gap: 10 (ref 5–15)
BUN: 49 mg/dL — ABNORMAL HIGH (ref 8–23)
CO2: 16 mmol/L — ABNORMAL LOW (ref 22–32)
Calcium: 8.5 mg/dL — ABNORMAL LOW (ref 8.9–10.3)
Chloride: 118 mmol/L — ABNORMAL HIGH (ref 98–111)
Creatinine, Ser: 2.08 mg/dL — ABNORMAL HIGH (ref 0.61–1.24)
GFR calc Af Amer: 37 mL/min — ABNORMAL LOW (ref >60)
GFR calc non Af Amer: 32 mL/min — ABNORMAL LOW (ref >60)
Glucose, Bld: 198 mg/dL — ABNORMAL HIGH (ref 70–99)
Potassium: 5.1 mmol/L (ref 3.5–5.1)
Sodium: 144 mmol/L (ref 135–145)
Total Bilirubin: 0.7 mg/dL (ref 0.3–1.2)
Total Protein: 6.1 g/dL — ABNORMAL LOW (ref 6.5–8.1)

## 2018-12-31 LAB — GLUCOSE, CAPILLARY
Glucose-Capillary: 202 mg/dL — ABNORMAL HIGH (ref 70–99)
Glucose-Capillary: 201 mg/dL — ABNORMAL HIGH (ref 70–99)

## 2018-12-31 LAB — MAGNESIUM: Magnesium: 1.9 mg/dL (ref 1.7–2.4)

## 2018-12-31 LAB — C-REACTIVE PROTEIN: CRP: 11.3 mg/dL — ABNORMAL HIGH (ref <1.0)

## 2018-12-31 LAB — D-DIMER, QUANTITATIVE: D-Dimer, Quant: 2.15 ug/mL-FEU — ABNORMAL HIGH (ref 0.00–0.50)

## 2018-12-31 LAB — FERRITIN: Ferritin: 863 ng/mL — ABNORMAL HIGH (ref 24–336)

## 2018-12-31 LAB — LACTATE DEHYDROGENASE: LDH: 295 U/L — ABNORMAL HIGH (ref 98–192)

## 2018-12-31 MED ORDER — LOPERAMIDE HCL 2 MG PO CAPS: 2.0000 mg | ORAL_CAPSULE | Freq: Two times a day (BID) | ORAL | 0 refills | Status: DC

## 2018-12-31 NOTE — Care Management (Signed)
CM unable reach pt via phone.  CM was able to reach wife.  Wife will transport pt home at discharge via private vehicle .  Wife confirms pt has PCP and wife will set up a post discharge appt with PCP directly - per wife PCP is aware that pt is covid positive.  Pt has working thermometer in the home.  No CM Needs determined at this time - CM signing off

## 2018-12-31 NOTE — Progress Notes (Signed)
Jon [name] to be D/C'd Home per MD order.  Discussed with the patient and all questions fully answered.  IV catheter discontinued intact. Site without signs and symptoms of complications. Dressing and pressure applied.  An After Visit Summary was printed and given to the patient.  D/c education completed with patient including follow up instructions, medication list, d/c activities limitations if indicated, with other d/c instructions as indicated by MD - patient able to verbalize understanding, all questions fully answered.   Patient instructed to return to ED, call 911, or call MD for any changes in condition.   Patient escorted via [place], and D/C home via private auto.  [name] 12/31/2018 2:49 PM

## 2018-12-31 NOTE — Progress Notes (Signed)
   12/31/18 0510  Mobility  Activity Ambulated in room;Ambulated to bathroom;Stood at bedside  Level of Assistance Minimal assist, patient does 75% or more  Assistive Device Front wheel walker  Distance Ambulated (ft) 30 ft  Mobility Response Tolerated poorly   Patient assisted x1 to ambulate from bed to bathroom. Patient ambulated to sink, losing balance once but not falling. Patient sat in chair in front of sink to bathe. Patient very fatigued, SOB. Patient max assist for bathing. Patient stood at sink for short period, then ambulated to bed. Patient assisted back to bed, not wanting to stay up in chair. SpO2 did not decrease below 90% on RA, but patient's respirations were tachy and labored. Patient resting in bed at this time, no distress noted., denies needs. Will continue to monitor.

## 2018-12-31 NOTE — Progress Notes (Addendum)
Inpatient Diabetes Program Recommendations  AACE/ADA: New Consensus Statement on Inpatient Glycemic Control (2015)  Target Ranges:  Prepandial:   less than 140 mg/dL      Peak postprandial:   less than 180 mg/dL (1-2 hours)      Critically ill patients:  140 - 180 mg/dL   Lab Results  Component Value Date   GLUCAP 202 (H) 12/31/2018   HGBA1C 7.8 (H) 12/28/2018    Review of Glycemic Control  Diabetes history: DM type 2 Outpatient Diabetes medications: Lantus 24 units, Humalog 5 units tid with meals + 2 units for every 25 points above target, Tradjenta 5 mg Daily Current orders for Inpatient glycemic control:  Lantus 20 units, Novolog 0-15 units tid + hs PO prednisone 5 mg Daily  A1c 7.8% on 12/18 Boost supplement bid  Inpatient Diabetes Program Recommendations:    Fasting glucose >200. Consider increasing Lantus to home dose of 24 units.  Thanks,  Tama Headings RN, MSN, BC-ADM Inpatient Diabetes Coordinator Team Pager [phone] (8a-5p)

## 2018-12-31 NOTE — Discharge Summary (Signed)
Physician Discharge Summary  Jon [name]:[id number] DOB: February 02, 1950 DOA: 12/27/2018  PCP: [name], MD  Admit date: 12/27/2018 Discharge date: 12/31/2018  Time spent: 30 minutes  Recommendations for Outpatient Follow-up:  1. Needs Chem-7 in about 1 week 2. Hold Lasix and Bactrim until then 3. New prescription of Imodium  Discharge Diagnoses:  Principal Problem:   AKI (acute kidney injury) ([place]) Active Problems:   Diabetes mellitus ([place])   History of kidney transplant   HTN (hypertension)   HLD (hyperlipidemia)   OSA (obstructive sleep apnea)   COVID-19 virus infection   Discharge Condition: Improved  Diet recommendation: Heart healthy diabetic  Filed Weights   12/27/18 1327  Weight: 104.8 kg    68 wm CKD 5 LUE AV fistula live donor transplant 10/04/2013 CMV negative recipient positive (baseline creatinine 1.8-2.3), CVA 03/2013-ILR placed 03/19/2013 DM TY 2 A1c, HTN pulmonary HTN followed by cardiology Nehemiah Massed ascending aortic aneurysm thyroidectomy benign follicular nodule 4496, multiple back surgeries-sleep apnea on CPAP Diagnosed coronavirus ~12/20/2018 main symptoms nausea vomiting-myalgia mild S OB Found to have AKI and admitted  Data Reviewed:  Creatinine 3.4-->78/3.2--62/2.6-->53/1.9 Potassium 5.7-->5.8-->5.3, CO2 18 -->13-->16 Ferritin 585-->611-->623-->803 CRP 7.0-->8.3-->9.7-->3.4 Ldh-->270-->265-->301-->278 Procalcitonin 0.22-- Dimer 1.86-->2.11--2.54-->2.07 Fibrinogen 698-->650 Hemoglobin 11.8-->11.6  platelet down from baseline in the past 120s 130s to 99-->109 127 Assessment & Plan: Coronavirus 19  infection without pneumonia No resp symptoms-started Decadron but switched back on 12/19 to usual home prednisone dose Was placed on Oxygen overnight 2/2 need for night CPAP Seems to be on room air at this time and does not have hospital requirements of oxygen this hospital stay Coronavirus related diarrhea Cut back NS 100 cc/H-->50  he  was discontinued off of fluids in the outpatient setting-he was given Imodium and his diarrhea completely stopped-C. 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 with ILR Continue aspirin 81 Pulmonary HTN Careful with fluid resuscitation strategy- Lasix 40 held Can continue labetalol 100 twice daily DM TY 2 A1c 7.8 with ESRD and nephropathy as well as neuropathy CBGs  slight elevated during hospital stay Continue Lantus 20 (home dose 24), moderate sliding scale (home meds 5 to 15 units) Resume Tradjenta 5 gabapentin 300 3 times daily and he was kept back on his home dose of Lantus 24 and sliding scale at home Thyroidectomy Continue Tapazole 2.5 mg daily Reflux Continue pantoprazole 40 daily LUTS Continue Flomax 0.4 at bedtime   Discharge Exam: Vitals:   12/31/18 0600 12/31/18 0630  BP:    Pulse: 87 87  Resp: (!) 21 (!) 23  Temp:    SpO2: (!) 89% 93%    General: Awake alert coherent no distress sitting up in bed feels overall better no fever no chills Cardiovascular: S1-S2 no murmur rub or gallop Respiratory: Clinically clear no added sound Abdomen soft no rebound no guarding Neurologically intact  Discharge Instructions   Discharge Instructions    Diet - low sodium heart healthy   Complete by: As directed    Discharge instructions   Complete by: As directed    Stop taking for now your Lasix and your Bactrim and follow-up with your primary care physician check labs in the outpatient setting  and you may be able to resume these medications I have given you a prescription for Imodium 1 tablet every 12 hours 12 tablets to help you slow down the  diarrhea-He should continue heart healthy diet and monitor your sugars as needed in the outpatient setting and adjust your Lantus I have put you back on your regular dose You were stabilized for discharge and needs to self isolate--I would recommend you sit forward in prone for extended periods of time during the week Happy holidays   Increase activity slowly   Complete by: As directed    Increase activity slowly   Complete by: As directed      Allergies as of 12/31/2018      Reactions   Lisinopril Other (See Comments)   Increased potassium level   Meloxicam Nausea And Vomiting   Victoza [liraglutide] Diarrhea, Nausea And Vomiting, Swelling      Medication List    STOP taking these medications   furosemide 40 MG tablet Commonly known as: LASIX   sulfamethoxazole-trimethoprim 400-80 MG tablet Commonly known as: BACTRIM     TAKE these medications   aspirin EC 81 MG tablet Take 81 mg by mouth daily.   atorvastatin 40 MG tablet Commonly known as: LIPITOR Take 40 mg by mouth daily.   calcitRIOL 0.25 MCG capsule Commonly known as: ROCALTROL Take 0.25 mcg by mouth See admin instructions. Take 1 capsule (0.25 mcg) by mouth five times weekly - Monday thru Friday   gabapentin 300 MG capsule Commonly known as: NEURONTIN Take 300 mg by mouth 2 (two) times daily.   HumaLOG KwikPen 100 UNIT/ML KiwkPen Generic drug: insulin lispro Inject 5-15 Units into the skin See admin instructions. Inject 5 units subcutaneously three times daily before meals adjusted per carb count - add 2 units for every 25 points   HYDROcodone-acetaminophen 10-325 MG tablet Commonly known as: NORCO Take 1-2 tablets by mouth every 6 (six) hours.   insulin glargine 100 unit/mL Sopn Commonly known as: LANTUS Inject 24 Units into the skin at bedtime.   labetalol 100 MG tablet Commonly known as: NORMODYNE Take 100 mg by mouth 2 (two) times daily.   loperamide 2 MG capsule Commonly known as: IMODIUM Take  1 capsule (2 mg total) by mouth every 12 (twelve) hours.   methimazole 5 MG tablet Commonly known as: TAPAZOLE Take 2.5 mg by mouth daily.   multivitamin with minerals Tabs tablet Take 1 tablet by mouth daily.   mycophenolate 180 MG EC tablet Commonly known as: MYFORTIC Take 180 mg by mouth 2 (two) times daily.   pantoprazole 40 MG tablet Commonly known as: PROTONIX Take 1 tablet (40 mg total) by mouth daily.   predniSONE 5 MG tablet Commonly known as: DELTASONE Take 5 mg by mouth daily with breakfast.   St Johns Wort 300 MG Tabs Take 300 mg by mouth 3 (three) times daily.   STRESS B PO Take 1 tablet by mouth daily.   tacrolimus 1 MG capsule Commonly known as: PROGRAF Take 2 mg by mouth 2 (two) times daily.   tamsulosin 0.4 MG Caps capsule Commonly known as: FLOMAX Take 0.4 mg by mouth at bedtime.   Tradjenta 5 MG Tabs tablet Generic drug: linagliptin Take 5 mg by mouth daily.   vitamin B-12 1000 MCG tablet Commonly known as: CYANOCOBALAMIN Take 1,000 mcg by mouth 2 (two) times daily.      Allergies  Allergen Reactions  . Lisinopril Other (See Comments)    Increased potassium  level   . Meloxicam Nausea And Vomiting  . Victoza [Liraglutide] Diarrhea, Nausea And Vomiting and Swelling      The results of significant diagnostics from this hospitalization (including imaging, microbiology, ancillary and laboratory) are listed below for reference.    Significant Diagnostic Studies: US RENAL  Result Date: 12/27/2018 CLINICAL DATA:  Acute renal failure. EXAM: RENAL / URINARY TRACT ULTRASOUND COMPLETE COMPARISON:  CT scan of the abdomen dated 11/15/2017 FINDINGS: Right Kidney: Renal measurements: 8.3 x 4.1 x 3.6 cm = volume: 64 mL. Slight increased echogenicity of the renal parenchyma with thinning of the renal cortex. No hydronephrosis. Left Kidney: Renal measurements: 7.5 x 4.4 x 3.5 cm = volume: 61 mL. Slight increased echogenicity of the renal parenchyma with  thinning of the renal cortex. No mass or hydronephrosis visualized. Bladder: Appears normal for degree of bladder distention. Other: None. IMPRESSION: Thinning of the renal cortices with echogenic renal parenchyma consistent with renal medical disease. No obstruction. Electronically Signed   By: Lorriane Shire M.D.   On: 12/27/2018 18:05   DG Chest Portable 1 View  Result Date: 12/27/2018 CLINICAL DATA:  Shortness of breath.  COVID. EXAM: PORTABLE CHEST 1 VIEW COMPARISON:  06/18/2017 FINDINGS: Loop recorder projects over the left heart. Mild cardiomegaly. No confluent opacities, effusions or edema. No acute bony abnormality. IMPRESSION: Cardiomegaly.  No acute cardiopulmonary disease. Electronically Signed   By: Rolm Baptise M.D.   On: 12/27/2018 14:06    Microbiology: Recent Results (from the past 240 hour(s))  Blood Culture (routine x 2)     Status: None (Preliminary result)   Collection Time: 12/27/18  2:13 PM   Specimen: BLOOD  Result Value Ref Range Status   Specimen Description BLOOD RIGHT ANTECUBITAL  Final   Special Requests   Final    BOTTLES DRAWN AEROBIC AND ANAEROBIC Blood Culture adequate volume   Culture   Final    NO GROWTH 4 DAYS Performed at [facility], [address], [place], [place] [address]    Report Status PENDING  Incomplete  Blood Culture (routine x 2)     Status: None (Preliminary result)   Collection Time: 12/27/18  2:21 PM   Specimen: BLOOD RIGHT HAND  Result Value Ref Range Status   Specimen Description BLOOD RIGHT HAND  Final   Special Requests   Final    BOTTLES DRAWN AEROBIC AND ANAEROBIC Blood Culture adequate volume   Culture   Final    NO GROWTH 4 DAYS Performed at [facility], [place] [address] [place], [place] [address]    Report Status PENDING  Incomplete  C Difficile Quick Screen w PCR reflex     Status: None   Collection Time: 12/28/18  9:11 AM   Specimen: Stool  Result Value Ref Range Status   C Diff antigen NEGATIVE NEGATIVE  Final   C Diff toxin NEGATIVE NEGATIVE Final   C Diff interpretation No C. difficile detected.  Final    Comment: Performed at [facility], [place] [address] [place], [place] [address]     Labs: Basic Metabolic Panel: Recent Labs  Lab 12/27/18 1411 12/28/18 0325 12/29/18 0542 12/30/18 0731 12/31/18 0403  NA 138 141 142 147* 144  K 5.6* 5.7* 5.8* 5.3* 5.1  CL 111 113* 119* 124* 118*  CO2 18* 18* 13* 16* 16*  GLUCOSE 209* 158* 254* 215* 198*  BUN 77* 78* 62* 53* 49*  CREATININE 3.42* 3.25* 2.69* 1.97* 2.08*  CALCIUM 9.0  8.3* 8.6* 8.4* 8.5*  MG  --  2.2 2.1 1.9 1.9   Liver Function Tests: Recent Labs  Lab 12/27/18 1411 12/28/18 0325 12/29/18 0542 12/30/18 0731 12/31/18 0403  AST 68* 61* 53* 36 31  ALT 67* 65* 60* 50* 45*  ALKPHOS 56 53 50 45 46  BILITOT 0.4 0.5 0.7 0.4 0.7  PROT 6.9 6.6 6.2* 5.8* 6.1*  ALBUMIN 3.2* 3.0* 2.8* 2.6* 2.8*   No results for input(s): LIPASE, AMYLASE in the last 168 hours. No results for input(s): AMMONIA in the last 168 hours. CBC: Recent Labs  Lab 12/27/18 1411 12/28/18 0325 12/29/18 0542 12/30/18 0731 12/31/18 0403  WBC 6.2 4.1 4.1 8.8 9.4  NEUTROABS 5.4 3.3 3.8 8.1* 8.8*  HGB 12.8* 11.8* 11.6* 11.9* 12.4*  HCT 38.4* 38.1* 35.8* 36.0* 38.3*  MCV 91.2 96.0 91.8 91.8 92.1  PLT 108* 99* 108* 127* 135*   Cardiac Enzymes: No results for input(s): CKTOTAL, CKMB, CKMBINDEX, TROPONINI in the last 168 hours. BNP: BNP (last 3 results) No results for input(s): BNP in the last 8760 hours.  ProBNP (last 3 results) No results for input(s): PROBNP in the last 8760 hours.  CBG: Recent Labs  Lab 12/30/18 0738 12/30/18 1237 12/30/18 1742 12/30/18 2000 12/31/18 0745  GLUCAP 208* 155* 185* 178* 202*       Signed:  Nita Sells MD   [facility] 12/31/2018, 9:53 AM

## 2018-12-31 NOTE — Care Management Important Message (Signed)
Important Message  Patient Details  Name: Jon [name] MRN: [id number] Date of Birth: 03-24-1950   Medicare Important Message Given:  Yes - Important Message mailed due to current National Emergency  Verbal consent obtained due to current National Emergency  Relationship to patient: Spouse/Significant Other Contact Name: Londen Bok Call Date: 12/31/18  Time: 1228 Phone: [phone] Outcome: Spoke with contact Important Message mailed to: Patient address on file    Delorse Lek 12/31/2018, 12:29 PM

## 2019-01-01 LAB — CULTURE, BLOOD (ROUTINE X 2)
Culture: NO GROWTH
Culture: NO GROWTH
Special Requests: ADEQUATE
Special Requests: ADEQUATE

## 2019-01-02 LAB — GI PATHOGEN PANEL BY PCR, STOOL

## 2019-01-03 ENCOUNTER — Encounter (HOSPITAL_COMMUNITY): Payer: Self-pay

## 2019-01-03 ENCOUNTER — Inpatient Hospital Stay (HOSPITAL_COMMUNITY)
Admission: EM | Admit: 2019-01-03 | Discharge: 2019-01-11 | DRG: 177 | Disposition: A | Discharge status: Home Health | Payer: Medicare Other | Attending: Internal Medicine | Admitting: Internal Medicine

## 2019-01-03 DIAGNOSIS — T8619 Other complication of kidney transplant: Secondary | ICD-10-CM | POA: Diagnosis not present

## 2019-01-03 DIAGNOSIS — I639 Cerebral infarction, unspecified: Secondary | ICD-10-CM

## 2019-01-03 DIAGNOSIS — I129 Hypertensive chronic kidney disease with stage 1 through stage 4 chronic kidney disease, or unspecified chronic kidney disease: Secondary | ICD-10-CM | POA: Diagnosis not present

## 2019-01-03 DIAGNOSIS — K219 Gastro-esophageal reflux disease without esophagitis: Secondary | ICD-10-CM | POA: Diagnosis not present

## 2019-01-03 DIAGNOSIS — Z7982 Long term (current) use of aspirin: Secondary | ICD-10-CM

## 2019-01-03 DIAGNOSIS — U071 COVID-19: Secondary | ICD-10-CM | POA: Diagnosis not present

## 2019-01-03 DIAGNOSIS — I272 Pulmonary hypertension, unspecified: Secondary | ICD-10-CM | POA: Diagnosis present

## 2019-01-03 DIAGNOSIS — Z794 Long term (current) use of insulin: Secondary | ICD-10-CM

## 2019-01-03 DIAGNOSIS — N189 Chronic kidney disease, unspecified: Secondary | ICD-10-CM

## 2019-01-03 DIAGNOSIS — Z79891 Long term (current) use of opiate analgesic: Secondary | ICD-10-CM

## 2019-01-03 DIAGNOSIS — D638 Anemia in other chronic diseases classified elsewhere: Secondary | ICD-10-CM | POA: Diagnosis not present

## 2019-01-03 DIAGNOSIS — E785 Hyperlipidemia, unspecified: Secondary | ICD-10-CM | POA: Diagnosis present

## 2019-01-03 DIAGNOSIS — E872 Acidosis: Secondary | ICD-10-CM | POA: Diagnosis not present

## 2019-01-03 DIAGNOSIS — G8929 Other chronic pain: Secondary | ICD-10-CM | POA: Diagnosis present

## 2019-01-03 DIAGNOSIS — R0602 Shortness of breath: Secondary | ICD-10-CM | POA: Diagnosis not present

## 2019-01-03 DIAGNOSIS — K649 Unspecified hemorrhoids: Secondary | ICD-10-CM | POA: Diagnosis not present

## 2019-01-03 DIAGNOSIS — E119 Type 2 diabetes mellitus without complications: Secondary | ICD-10-CM | POA: Diagnosis not present

## 2019-01-03 DIAGNOSIS — J9601 Acute respiratory failure with hypoxia: Secondary | ICD-10-CM | POA: Diagnosis present

## 2019-01-03 DIAGNOSIS — E875 Hyperkalemia: Secondary | ICD-10-CM | POA: Diagnosis present

## 2019-01-03 DIAGNOSIS — E0590 Thyrotoxicosis, unspecified without thyrotoxic crisis or storm: Secondary | ICD-10-CM | POA: Diagnosis present

## 2019-01-03 DIAGNOSIS — E1122 Type 2 diabetes mellitus with diabetic chronic kidney disease: Secondary | ICD-10-CM | POA: Diagnosis present

## 2019-01-03 DIAGNOSIS — R0902 Hypoxemia: Secondary | ICD-10-CM | POA: Diagnosis not present

## 2019-01-03 DIAGNOSIS — E1142 Type 2 diabetes mellitus with diabetic polyneuropathy: Secondary | ICD-10-CM | POA: Diagnosis present

## 2019-01-03 DIAGNOSIS — R7401 Elevation of levels of liver transaminase levels: Secondary | ICD-10-CM | POA: Diagnosis not present

## 2019-01-03 DIAGNOSIS — N184 Chronic kidney disease, stage 4 (severe): Secondary | ICD-10-CM | POA: Diagnosis present

## 2019-01-03 DIAGNOSIS — M549 Dorsalgia, unspecified: Secondary | ICD-10-CM | POA: Diagnosis not present

## 2019-01-03 DIAGNOSIS — Z841 Family history of disorders of kidney and ureter: Secondary | ICD-10-CM

## 2019-01-03 DIAGNOSIS — J188 Other pneumonia, unspecified organism: Secondary | ICD-10-CM | POA: Diagnosis not present

## 2019-01-03 DIAGNOSIS — G4730 Sleep apnea, unspecified: Secondary | ICD-10-CM | POA: Diagnosis present

## 2019-01-03 DIAGNOSIS — Z7952 Long term (current) use of systemic steroids: Secondary | ICD-10-CM

## 2019-01-03 DIAGNOSIS — R7989 Other specified abnormal findings of blood chemistry: Secondary | ICD-10-CM

## 2019-01-03 DIAGNOSIS — K2960 Other gastritis without bleeding: Secondary | ICD-10-CM

## 2019-01-03 DIAGNOSIS — J1282 Pneumonia due to coronavirus disease 2019: Secondary | ICD-10-CM | POA: Diagnosis not present

## 2019-01-03 DIAGNOSIS — E039 Hypothyroidism, unspecified: Secondary | ICD-10-CM | POA: Diagnosis not present

## 2019-01-03 DIAGNOSIS — T380X5A Adverse effect of glucocorticoids and synthetic analogues, initial encounter: Secondary | ICD-10-CM | POA: Diagnosis not present

## 2019-01-03 DIAGNOSIS — Z87891 Personal history of nicotine dependence: Secondary | ICD-10-CM

## 2019-01-03 DIAGNOSIS — I509 Heart failure, unspecified: Secondary | ICD-10-CM | POA: Diagnosis not present

## 2019-01-03 DIAGNOSIS — Z8719 Personal history of other diseases of the digestive system: Secondary | ICD-10-CM

## 2019-01-03 DIAGNOSIS — Z743 Need for continuous supervision: Secondary | ICD-10-CM | POA: Diagnosis not present

## 2019-01-03 DIAGNOSIS — I10 Essential (primary) hypertension: Secondary | ICD-10-CM | POA: Diagnosis not present

## 2019-01-03 DIAGNOSIS — Z8673 Personal history of transient ischemic attack (TIA), and cerebral infarction without residual deficits: Secondary | ICD-10-CM | POA: Diagnosis not present

## 2019-01-03 DIAGNOSIS — Z8249 Family history of ischemic heart disease and other diseases of the circulatory system: Secondary | ICD-10-CM

## 2019-01-03 DIAGNOSIS — Z8349 Family history of other endocrine, nutritional and metabolic diseases: Secondary | ICD-10-CM

## 2019-01-03 DIAGNOSIS — Z79899 Other long term (current) drug therapy: Secondary | ICD-10-CM

## 2019-01-03 DIAGNOSIS — R531 Weakness: Secondary | ICD-10-CM | POA: Diagnosis not present

## 2019-01-03 DIAGNOSIS — G629 Polyneuropathy, unspecified: Secondary | ICD-10-CM | POA: Diagnosis not present

## 2019-01-03 DIAGNOSIS — N179 Acute kidney failure, unspecified: Secondary | ICD-10-CM | POA: Diagnosis not present

## 2019-01-03 DIAGNOSIS — I130 Hypertensive heart and chronic kidney disease with heart failure and stage 1 through stage 4 chronic kidney disease, or unspecified chronic kidney disease: Secondary | ICD-10-CM | POA: Diagnosis not present

## 2019-01-03 DIAGNOSIS — E1165 Type 2 diabetes mellitus with hyperglycemia: Secondary | ICD-10-CM | POA: Diagnosis not present

## 2019-01-03 NOTE — ED Triage Notes (Signed)
Pt BIB [facility] from home. Tested Positive for COVID-19 Dec. 10th. Progressively worse since then with increased SOB, weakness, and appetite.   Upon EMS arrival, pt was 83% RA and placed on 4L [place] and rose to 97%  Pt currently 90-92% on RA. Placed on 3L [place].  Hx of Kidney Disease. Fistula in L. Arm but not used due to kidney transplant in 2000-2001 per pt.   VSS with EMS.

## 2019-01-04 ENCOUNTER — Emergency Department (HOSPITAL_COMMUNITY): Payer: Medicare Other

## 2019-01-04 ENCOUNTER — Encounter (HOSPITAL_COMMUNITY): Payer: Self-pay | Admitting: Internal Medicine

## 2019-01-04 ENCOUNTER — Inpatient Hospital Stay (HOSPITAL_COMMUNITY): Payer: Medicare Other

## 2019-01-04 DIAGNOSIS — N189 Chronic kidney disease, unspecified: Secondary | ICD-10-CM

## 2019-01-04 DIAGNOSIS — E039 Hypothyroidism, unspecified: Secondary | ICD-10-CM | POA: Diagnosis present

## 2019-01-04 DIAGNOSIS — I509 Heart failure, unspecified: Secondary | ICD-10-CM | POA: Diagnosis present

## 2019-01-04 DIAGNOSIS — E119 Type 2 diabetes mellitus without complications: Secondary | ICD-10-CM | POA: Diagnosis not present

## 2019-01-04 DIAGNOSIS — G4733 Obstructive sleep apnea (adult) (pediatric): Secondary | ICD-10-CM | POA: Diagnosis not present

## 2019-01-04 DIAGNOSIS — E785 Hyperlipidemia, unspecified: Secondary | ICD-10-CM | POA: Diagnosis present

## 2019-01-04 DIAGNOSIS — T8619 Other complication of kidney transplant: Secondary | ICD-10-CM | POA: Diagnosis present

## 2019-01-04 DIAGNOSIS — K219 Gastro-esophageal reflux disease without esophagitis: Secondary | ICD-10-CM | POA: Diagnosis present

## 2019-01-04 DIAGNOSIS — I130 Hypertensive heart and chronic kidney disease with heart failure and stage 1 through stage 4 chronic kidney disease, or unspecified chronic kidney disease: Secondary | ICD-10-CM | POA: Diagnosis present

## 2019-01-04 DIAGNOSIS — R7401 Elevation of levels of liver transaminase levels: Secondary | ICD-10-CM | POA: Diagnosis present

## 2019-01-04 DIAGNOSIS — I272 Pulmonary hypertension, unspecified: Secondary | ICD-10-CM

## 2019-01-04 DIAGNOSIS — I129 Hypertensive chronic kidney disease with stage 1 through stage 4 chronic kidney disease, or unspecified chronic kidney disease: Secondary | ICD-10-CM | POA: Diagnosis not present

## 2019-01-04 DIAGNOSIS — I639 Cerebral infarction, unspecified: Secondary | ICD-10-CM | POA: Diagnosis not present

## 2019-01-04 DIAGNOSIS — Z8673 Personal history of transient ischemic attack (TIA), and cerebral infarction without residual deficits: Secondary | ICD-10-CM | POA: Diagnosis not present

## 2019-01-04 DIAGNOSIS — N179 Acute kidney failure, unspecified: Secondary | ICD-10-CM

## 2019-01-04 DIAGNOSIS — R7989 Other specified abnormal findings of blood chemistry: Secondary | ICD-10-CM | POA: Diagnosis not present

## 2019-01-04 DIAGNOSIS — R0602 Shortness of breath: Secondary | ICD-10-CM | POA: Diagnosis not present

## 2019-01-04 DIAGNOSIS — E1122 Type 2 diabetes mellitus with diabetic chronic kidney disease: Secondary | ICD-10-CM | POA: Diagnosis present

## 2019-01-04 DIAGNOSIS — J1282 Pneumonia due to coronavirus disease 2019: Secondary | ICD-10-CM | POA: Diagnosis present

## 2019-01-04 DIAGNOSIS — J9601 Acute respiratory failure with hypoxia: Secondary | ICD-10-CM | POA: Diagnosis present

## 2019-01-04 DIAGNOSIS — G9340 Encephalopathy, unspecified: Secondary | ICD-10-CM | POA: Diagnosis not present

## 2019-01-04 DIAGNOSIS — E872 Acidosis: Secondary | ICD-10-CM | POA: Diagnosis present

## 2019-01-04 DIAGNOSIS — E875 Hyperkalemia: Secondary | ICD-10-CM | POA: Diagnosis present

## 2019-01-04 DIAGNOSIS — I10 Essential (primary) hypertension: Secondary | ICD-10-CM | POA: Diagnosis not present

## 2019-01-04 DIAGNOSIS — M549 Dorsalgia, unspecified: Secondary | ICD-10-CM | POA: Diagnosis present

## 2019-01-04 DIAGNOSIS — K649 Unspecified hemorrhoids: Secondary | ICD-10-CM | POA: Diagnosis present

## 2019-01-04 DIAGNOSIS — N184 Chronic kidney disease, stage 4 (severe): Secondary | ICD-10-CM | POA: Diagnosis not present

## 2019-01-04 DIAGNOSIS — G8929 Other chronic pain: Secondary | ICD-10-CM | POA: Diagnosis present

## 2019-01-04 DIAGNOSIS — G629 Polyneuropathy, unspecified: Secondary | ICD-10-CM | POA: Diagnosis present

## 2019-01-04 DIAGNOSIS — E8770 Fluid overload, unspecified: Secondary | ICD-10-CM | POA: Diagnosis not present

## 2019-01-04 DIAGNOSIS — E7800 Pure hypercholesterolemia, unspecified: Secondary | ICD-10-CM | POA: Diagnosis not present

## 2019-01-04 DIAGNOSIS — U071 COVID-19: Principal | ICD-10-CM

## 2019-01-04 DIAGNOSIS — Z7982 Long term (current) use of aspirin: Secondary | ICD-10-CM | POA: Diagnosis not present

## 2019-01-04 DIAGNOSIS — G4730 Sleep apnea, unspecified: Secondary | ICD-10-CM | POA: Diagnosis present

## 2019-01-04 DIAGNOSIS — D638 Anemia in other chronic diseases classified elsewhere: Secondary | ICD-10-CM | POA: Diagnosis present

## 2019-01-04 HISTORY — DX: Acute respiratory failure with hypoxia: J96.01

## 2019-01-04 LAB — COMPREHENSIVE METABOLIC PANEL
ALT: 106 U/L — ABNORMAL HIGH (ref 0–44)
AST: 85 U/L — ABNORMAL HIGH (ref 15–41)
Albumin: 2.5 g/dL — ABNORMAL LOW (ref 3.5–5.0)
Alkaline Phosphatase: 64 U/L (ref 38–126)
Anion gap: 7 (ref 5–15)
BUN: 76 mg/dL — ABNORMAL HIGH (ref 8–23)
CO2: 16 mmol/L — ABNORMAL LOW (ref 22–32)
Calcium: 8.7 mg/dL — ABNORMAL LOW (ref 8.9–10.3)
Chloride: 118 mmol/L — ABNORMAL HIGH (ref 98–111)
Creatinine, Ser: 2.95 mg/dL — ABNORMAL HIGH (ref 0.61–1.24)
GFR calc Af Amer: 24 mL/min — ABNORMAL LOW (ref >60)
GFR calc non Af Amer: 21 mL/min — ABNORMAL LOW (ref >60)
Glucose, Bld: 166 mg/dL — ABNORMAL HIGH (ref 70–99)
Potassium: 5.7 mmol/L — ABNORMAL HIGH (ref 3.5–5.1)
Sodium: 141 mmol/L (ref 135–145)
Total Bilirubin: 0.7 mg/dL (ref 0.3–1.2)
Total Protein: 6.1 g/dL — ABNORMAL LOW (ref 6.5–8.1)

## 2019-01-04 LAB — CBG MONITORING, ED
Glucose-Capillary: 157 mg/dL — ABNORMAL HIGH (ref 70–99)
Glucose-Capillary: 251 mg/dL — ABNORMAL HIGH (ref 70–99)
Glucose-Capillary: 248 mg/dL — ABNORMAL HIGH (ref 70–99)
Glucose-Capillary: 256 mg/dL — ABNORMAL HIGH (ref 70–99)

## 2019-01-04 LAB — FERRITIN
Ferritin: 1247 ng/mL — ABNORMAL HIGH (ref 24–336)
Ferritin: 1339 ng/mL — ABNORMAL HIGH (ref 24–336)

## 2019-01-04 LAB — CBC WITH DIFFERENTIAL/PLATELET
Abs Immature Granulocytes: 0.04 10*3/uL (ref 0.00–0.07)
Abs Immature Granulocytes: 0.05 10*3/uL (ref 0.00–0.07)
Basophils Absolute: 0 10*3/uL (ref 0.0–0.1)
Basophils Absolute: 0 10*3/uL (ref 0.0–0.1)
Basophils Relative: 0 %
Basophils Relative: 0 %
Eosinophils Absolute: 0.1 10*3/uL (ref 0.0–0.5)
Eosinophils Absolute: 0.1 10*3/uL (ref 0.0–0.5)
Eosinophils Relative: 1 %
Eosinophils Relative: 2 %
HCT: 35.2 % — ABNORMAL LOW (ref 39.0–52.0)
HCT: 36.9 % — ABNORMAL LOW (ref 39.0–52.0)
Hemoglobin: 11.2 g/dL — ABNORMAL LOW (ref 13.0–17.0)
Hemoglobin: 11.8 g/dL — ABNORMAL LOW (ref 13.0–17.0)
Immature Granulocytes: 1 %
Immature Granulocytes: 1 %
Lymphocytes Relative: 3 %
Lymphocytes Relative: 5 %
Lymphs Abs: 0.2 10*3/uL — ABNORMAL LOW (ref 0.7–4.0)
Lymphs Abs: 0.3 10*3/uL — ABNORMAL LOW (ref 0.7–4.0)
MCH: 29.9 pg (ref 26.0–34.0)
MCH: 30.1 pg (ref 26.0–34.0)
MCHC: 31.8 g/dL (ref 30.0–36.0)
MCHC: 32 g/dL (ref 30.0–36.0)
MCV: 93.4 fL (ref 80.0–100.0)
MCV: 94.6 fL (ref 80.0–100.0)
Monocytes Absolute: 0.3 10*3/uL (ref 0.1–1.0)
Monocytes Absolute: 0.6 10*3/uL (ref 0.1–1.0)
Monocytes Relative: 5 %
Monocytes Relative: 9 %
Neutro Abs: 5 10*3/uL (ref 1.7–7.7)
Neutro Abs: 5.1 10*3/uL (ref 1.7–7.7)
Neutrophils Relative %: 83 %
Neutrophils Relative %: 90 %
Platelets: 181 10*3/uL (ref 150–400)
Platelets: 198 10*3/uL (ref 150–400)
RBC: 3.72 MIL/uL — ABNORMAL LOW (ref 4.22–5.81)
RBC: 3.95 MIL/uL — ABNORMAL LOW (ref 4.22–5.81)
RDW: 13.8 % (ref 11.5–15.5)
RDW: 14 % (ref 11.5–15.5)
WBC: 5.5 10*3/uL (ref 4.0–10.5)
WBC: 6.1 10*3/uL (ref 4.0–10.5)
nRBC: 0 % (ref 0.0–0.2)
nRBC: 0 % (ref 0.0–0.2)

## 2019-01-04 LAB — HEPATIC FUNCTION PANEL
ALT: 91 U/L — ABNORMAL HIGH (ref 0–44)
AST: 55 U/L — ABNORMAL HIGH (ref 15–41)
Albumin: 2.3 g/dL — ABNORMAL LOW (ref 3.5–5.0)
Alkaline Phosphatase: 59 U/L (ref 38–126)
Bilirubin, Direct: 0.1 mg/dL (ref 0.0–0.2)
Indirect Bilirubin: 0.1 mg/dL — ABNORMAL LOW (ref 0.3–0.9)
Total Bilirubin: 0.2 mg/dL — ABNORMAL LOW (ref 0.3–1.2)
Total Protein: 5.7 g/dL — ABNORMAL LOW (ref 6.5–8.1)

## 2019-01-04 LAB — C-REACTIVE PROTEIN
CRP: 16.5 mg/dL — ABNORMAL HIGH (ref <1.0)
CRP: 15.9 mg/dL — ABNORMAL HIGH (ref <1.0)

## 2019-01-04 LAB — D-DIMER, QUANTITATIVE
D-Dimer, Quant: 7.04 ug/mL-FEU — ABNORMAL HIGH (ref 0.00–0.50)
D-Dimer, Quant: 7.96 ug/mL-FEU — ABNORMAL HIGH (ref 0.00–0.50)

## 2019-01-04 LAB — BASIC METABOLIC PANEL
Anion gap: 11 (ref 5–15)
BUN: 70 mg/dL — ABNORMAL HIGH (ref 8–23)
CO2: 15 mmol/L — ABNORMAL LOW (ref 22–32)
Calcium: 8.6 mg/dL — ABNORMAL LOW (ref 8.9–10.3)
Chloride: 120 mmol/L — ABNORMAL HIGH (ref 98–111)
Creatinine, Ser: 2.66 mg/dL — ABNORMAL HIGH (ref 0.61–1.24)
GFR calc Af Amer: 28 mL/min — ABNORMAL LOW (ref >60)
GFR calc non Af Amer: 24 mL/min — ABNORMAL LOW (ref >60)
Glucose, Bld: 167 mg/dL — ABNORMAL HIGH (ref 70–99)
Potassium: 5.9 mmol/L — ABNORMAL HIGH (ref 3.5–5.1)
Sodium: 146 mmol/L — ABNORMAL HIGH (ref 135–145)

## 2019-01-04 LAB — PROCALCITONIN: Procalcitonin: 0.18 ng/mL

## 2019-01-04 LAB — LACTIC ACID, PLASMA: Lactic Acid, Venous: 1.1 mmol/L (ref 0.5–1.9)

## 2019-01-04 LAB — LACTATE DEHYDROGENASE: LDH: 328 U/L — ABNORMAL HIGH (ref 98–192)

## 2019-01-04 LAB — FIBRINOGEN: Fibrinogen: 708 mg/dL — ABNORMAL HIGH (ref 210–475)

## 2019-01-04 LAB — BRAIN NATRIURETIC PEPTIDE: B Natriuretic Peptide: 59 pg/mL (ref 0.0–100.0)

## 2019-01-04 LAB — TRIGLYCERIDES: Triglycerides: 113 mg/dL (ref <150)

## 2019-01-04 MED ORDER — SODIUM CHLORIDE 0.9 % IV SOLN
200.0000 mg | Freq: Once | INTRAVENOUS | Status: AC
  Administered 2019-01-04: 200 mg via INTRAVENOUS
  Filled 2019-01-04: qty 40

## 2019-01-04 MED ORDER — SODIUM CHLORIDE 0.9 % IV SOLN: INTRAVENOUS | Status: DC

## 2019-01-04 MED ORDER — TACROLIMUS 1 MG PO CAPS
2.0000 mg | ORAL_CAPSULE | Freq: Two times a day (BID) | ORAL | Status: DC
  Administered 2019-01-04 – 2019-01-11 (×15): 2 mg via ORAL
  Filled 2019-01-04 (×17): qty 2

## 2019-01-04 MED ORDER — DEXAMETHASONE SODIUM PHOSPHATE 10 MG/ML IJ SOLN
6.0000 mg | INTRAMUSCULAR | Status: DC
  Administered 2019-01-04 – 2019-01-11 (×8): 6 mg via INTRAVENOUS
  Filled 2019-01-04 (×8): qty 1

## 2019-01-04 MED ORDER — ASPIRIN EC 81 MG PO TBEC
81.0000 mg | DELAYED_RELEASE_TABLET | Freq: Every day | ORAL | Status: DC
  Administered 2019-01-04 – 2019-01-11 (×8): 81 mg via ORAL
  Filled 2019-01-04 (×8): qty 1

## 2019-01-04 MED ORDER — TAMSULOSIN HCL 0.4 MG PO CAPS
0.4000 mg | ORAL_CAPSULE | Freq: Every day | ORAL | Status: DC
  Administered 2019-01-04 – 2019-01-10 (×7): 0.4 mg via ORAL
  Filled 2019-01-04 (×8): qty 1

## 2019-01-04 MED ORDER — VITAMIN B-12 1000 MCG PO TABS
1000.0000 ug | ORAL_TABLET | Freq: Two times a day (BID) | ORAL | Status: DC
  Administered 2019-01-04 – 2019-01-08 (×10): 1000 ug via ORAL
  Filled 2019-01-04 (×11): qty 1

## 2019-01-04 MED ORDER — LINAGLIPTIN 5 MG PO TABS
5.0000 mg | ORAL_TABLET | Freq: Every day | ORAL | Status: DC
  Administered 2019-01-04 – 2019-01-11 (×8): 5 mg via ORAL
  Filled 2019-01-04 (×8): qty 1

## 2019-01-04 MED ORDER — HYDROCODONE-ACETAMINOPHEN 10-325 MG PO TABS
1.0000 | ORAL_TABLET | Freq: Four times a day (QID) | ORAL | Status: DC
  Administered 2019-01-04 (×2): 1 via ORAL
  Administered 2019-01-04 – 2019-01-05 (×2): 2 via ORAL
  Administered 2019-01-05 – 2019-01-09 (×17): 1 via ORAL
  Administered 2019-01-09 – 2019-01-11 (×7): 2 via ORAL
  Filled 2019-01-04: qty 2
  Filled 2019-01-04: qty 1
  Filled 2019-01-04: qty 2
  Filled 2019-01-04 (×3): qty 1
  Filled 2019-01-04: qty 2
  Filled 2019-01-04 (×3): qty 1
  Filled 2019-01-04: qty 2
  Filled 2019-01-04 (×2): qty 1
  Filled 2019-01-04: qty 2
  Filled 2019-01-04 (×2): qty 1
  Filled 2019-01-04 (×2): qty 2
  Filled 2019-01-04 (×4): qty 1
  Filled 2019-01-04: qty 2
  Filled 2019-01-04: qty 1
  Filled 2019-01-04 (×2): qty 2
  Filled 2019-01-04 (×2): qty 1

## 2019-01-04 MED ORDER — ATORVASTATIN CALCIUM 40 MG PO TABS
40.0000 mg | ORAL_TABLET | Freq: Every day | ORAL | Status: DC
  Administered 2019-01-04 – 2019-01-11 (×8): 40 mg via ORAL
  Filled 2019-01-04 (×8): qty 1

## 2019-01-04 MED ORDER — LABETALOL HCL 100 MG PO TABS
100.0000 mg | ORAL_TABLET | Freq: Two times a day (BID) | ORAL | Status: DC
  Administered 2019-01-04 – 2019-01-11 (×15): 100 mg via ORAL
  Filled 2019-01-04 (×15): qty 1

## 2019-01-04 MED ORDER — ONDANSETRON HCL 4 MG PO TABS: 4.0000 mg | ORAL_TABLET | Freq: Four times a day (QID) | ORAL | Status: DC | PRN

## 2019-01-04 MED ORDER — METHIMAZOLE 5 MG PO TABS
2.5000 mg | ORAL_TABLET | Freq: Every day | ORAL | Status: DC
  Administered 2019-01-04 – 2019-01-11 (×8): 2.5 mg via ORAL
  Filled 2019-01-04 (×8): qty 1

## 2019-01-04 MED ORDER — SODIUM CHLORIDE 0.9 % IV SOLN
100.0000 mg | Freq: Every day | INTRAVENOUS | Status: AC
  Administered 2019-01-05 – 2019-01-08 (×4): 100 mg via INTRAVENOUS
  Filled 2019-01-04 (×4): qty 20

## 2019-01-04 MED ORDER — ONDANSETRON HCL 4 MG/2ML IJ SOLN: 4.0000 mg | Freq: Four times a day (QID) | INTRAMUSCULAR | Status: DC | PRN

## 2019-01-04 MED ORDER — SODIUM ZIRCONIUM CYCLOSILICATE 10 G PO PACK
10.0000 g | PACK | Freq: Two times a day (BID) | ORAL | Status: AC
  Administered 2019-01-04: 14:00:00 10 g via ORAL
  Filled 2019-01-04 (×2): qty 1

## 2019-01-04 MED ORDER — GABAPENTIN 300 MG PO CAPS
300.0000 mg | ORAL_CAPSULE | Freq: Two times a day (BID) | ORAL | Status: DC
  Administered 2019-01-04 – 2019-01-11 (×15): 300 mg via ORAL
  Filled 2019-01-04 (×15): qty 1

## 2019-01-04 MED ORDER — ACETAMINOPHEN 325 MG PO TABS: 650.0000 mg | ORAL_TABLET | Freq: Four times a day (QID) | ORAL | Status: DC | PRN

## 2019-01-04 MED ORDER — INSULIN ASPART 100 UNIT/ML ~~LOC~~ SOLN
0.0000 [IU] | Freq: Three times a day (TID) | SUBCUTANEOUS | Status: DC
  Administered 2019-01-04: 14:00:00 5 [IU] via SUBCUTANEOUS
  Administered 2019-01-04: 11:00:00 2 [IU] via SUBCUTANEOUS
  Administered 2019-01-04: 17:00:00 3 [IU] via SUBCUTANEOUS
  Administered 2019-01-05: 5 [IU] via SUBCUTANEOUS
  Administered 2019-01-05: 7 [IU] via SUBCUTANEOUS
  Administered 2019-01-06 (×2): 3 [IU] via SUBCUTANEOUS
  Administered 2019-01-06: 5 [IU] via SUBCUTANEOUS
  Administered 2019-01-07: 14:00:00 3 [IU] via SUBCUTANEOUS
  Administered 2019-01-07: 18:00:00 5 [IU] via SUBCUTANEOUS
  Administered 2019-01-07: 2 [IU] via SUBCUTANEOUS
  Administered 2019-01-08: 7 [IU] via SUBCUTANEOUS
  Administered 2019-01-08: 3 [IU] via SUBCUTANEOUS
  Administered 2019-01-08: 2 [IU] via SUBCUTANEOUS
  Administered 2019-01-09: 3 [IU] via SUBCUTANEOUS
  Administered 2019-01-09 (×2): 7 [IU] via SUBCUTANEOUS
  Administered 2019-01-10: 18:00:00 3 [IU] via SUBCUTANEOUS
  Administered 2019-01-10: 09:00:00 2 [IU] via SUBCUTANEOUS
  Administered 2019-01-10: 7 [IU] via SUBCUTANEOUS
  Administered 2019-01-11: 12:00:00 5 [IU] via SUBCUTANEOUS
  Administered 2019-01-11: 09:00:00 1 [IU] via SUBCUTANEOUS

## 2019-01-04 MED ORDER — PANTOPRAZOLE SODIUM 40 MG PO TBEC: 40.0000 mg | DELAYED_RELEASE_TABLET | Freq: Every day | ORAL | Status: DC

## 2019-01-04 MED ORDER — INSULIN GLARGINE 100 UNITS/ML SOLOSTAR PEN
24.0000 [IU] | PEN_INJECTOR | Freq: Every day | SUBCUTANEOUS | Status: DC
  Filled 2019-01-04: qty 3

## 2019-01-04 MED ORDER — ADULT MULTIVITAMIN W/MINERALS CH
1.0000 | ORAL_TABLET | Freq: Every day | ORAL | Status: DC
  Administered 2019-01-04 – 2019-01-11 (×8): 1 via ORAL
  Filled 2019-01-04 (×9): qty 1

## 2019-01-04 MED ORDER — MYCOPHENOLATE SODIUM 180 MG PO TBEC
180.0000 mg | DELAYED_RELEASE_TABLET | Freq: Two times a day (BID) | ORAL | Status: DC
  Administered 2019-01-04 – 2019-01-11 (×15): 180 mg via ORAL
  Filled 2019-01-04 (×17): qty 1

## 2019-01-04 MED ORDER — SODIUM CHLORIDE 0.9 % IV BOLUS (SEPSIS)
1000.0000 mL | Freq: Once | INTRAVENOUS | Status: AC
  Administered 2019-01-04: 1000 mL via INTRAVENOUS

## 2019-01-04 MED ORDER — CALCITRIOL 0.25 MCG PO CAPS
0.2500 ug | ORAL_CAPSULE | ORAL | Status: DC
  Administered 2019-01-06 – 2019-01-09 (×4): 0.25 ug via ORAL
  Filled 2019-01-04 (×4): qty 1

## 2019-01-04 MED ORDER — ACETAMINOPHEN 650 MG RE SUPP: 650.0000 mg | Freq: Four times a day (QID) | RECTAL | Status: DC | PRN

## 2019-01-04 MED ORDER — INSULIN GLARGINE 100 UNIT/ML ~~LOC~~ SOLN
24.0000 [IU] | Freq: Every day | SUBCUTANEOUS | Status: DC
  Administered 2019-01-04 – 2019-01-07 (×4): 24 [IU] via SUBCUTANEOUS
  Filled 2019-01-04 (×6): qty 0.24

## 2019-01-04 MED ORDER — HEPARIN SODIUM (PORCINE) 5000 UNIT/ML IJ SOLN
5000.0000 [IU] | Freq: Three times a day (TID) | INTRAMUSCULAR | Status: DC
  Administered 2019-01-04 – 2019-01-11 (×22): 5000 [IU] via SUBCUTANEOUS
  Filled 2019-01-04 (×22): qty 1

## 2019-01-04 NOTE — ED Notes (Addendum)
Spoke to pt's wife, Lakota Markgraf with update [phone].

## 2019-01-04 NOTE — ED Notes (Signed)
Wife called upset--transferred to Dr Lance Morin

## 2019-01-04 NOTE — H&P (Signed)
History and Physical    CLAUDELL WOHLER OQH:[id number] DOB: [birth year] DOA: 01/03/2019  PCP: [name], MD  Patient coming from: Home.  Chief Complaint: Shortness of breath.  HPI: Jon [name] is a 68 y.o. male with history of renal transplant stage IV kidney disease now, diabetes mellitus type 2, hypertension and history of stroke who was just discharged 3 days ago after being admitted for Covid 19 infection with diarrhea was empirically treated with IV fluids at that time patient's Lasix and Bactrim were withheld and discharged home after patient's creatinine improved and diarrhea stopped.  Patient after reaching home became more short of breath and since it was persistent patient came back to the ER but denies any chest pain or fever chills.  No productive cough.  ED Course: Chest x-ray in the ER shows interstitial changes which could be either from Covid infection or fluid overload.  Labs show creatinine of 2.9 which is increased from 2 about 4 days ago potassium 5.7 blood glucose 166 AST and ALT also has increased.  LDH 328 ferritin 1247 and CRP 16.5 procalcitonin 0.18 BNP 59.  Hemoglobin slightly decreased from baseline is around 11.8 WBC count 6.1 EKG shows normal sinus rhythm.  Given the acute respiratory failure with hypoxia with the chest x-ray showed infiltrates with normal BNP likely causes Covid pneumonia for which I have started patient on IV Decadron and remdesivir admitted for further management.  Review of Systems: As per HPI, rest all negative.   Past Medical History:  Diagnosis Date  . Abnormal gait   . Anemia of chronic disease   . Arthritis    back, hands   . Chronic back pain    radiculopathy and stenosis  . Chronic ischemic heart disease    Severe LVH  . Chronic kidney disease    [name], awaiting Transplant   . Congestive heart failure (CHF) ([place])   . CVA (cerebral infarction) 03/2013  . Diabetes mellitus     Lantus nightly ;type 2  . GERD  (gastroesophageal reflux disease)   . H/O hiatal hernia   . History of colon polyps   . Hx of cardiovascular stress test    a.  Lexiscan Myoview (05/2013):  No ischemia, EF 53%; Normal Study  . Hyperlipidemia    takes Lovastatin daily  . Hypertension    takes Amlodipine and Catapres daily    dr Forrestine Him, Loop Recorder - [facility]  . Nocturia   . Obesity   . Peripheral edema    takes Lasix daily  . Sleep apnea    cpap , study in their home, 09/2102- [facility]           . Stroke (HCC)    speech, rt arm weakness  . Urinary frequency     Past Surgical History:  Procedure Laterality Date  . AV FISTULA PLACEMENT Left 04/03/2013   Procedure: ARTERIOVENOUS (AV) FISTULA CREATION- LEFT RADIOCEPHALIC VS BRACHIOCEPHALIC;  Surgeon: Conrad [place], MD;  Location: [facility];  Service: Vascular;  Laterality: Left;  . AV FISTULA PLACEMENT Left 05/14/2013   Procedure: LEFT BRACHIOCEPHALIC ARTERIOVENOUS (AV) FISTULA CREATION;  Surgeon: Conrad [place], MD;  Location: [facility];  Service: Vascular;  Laterality: Left;  . BACK SURGERY  12/2012   2003- 1st back surgery & then 2014- fusion  . COLONOSCOPY    . ESOPHAGOGASTRODUODENOSCOPY    . HEMORRHOID SURGERY    . KIDNEY TRANSPLANT  10/01/2013  . left knee surgery    .  LOOP RECORDER IMPLANT  03/19/13   MDT LinQ implanted by Dr Rayann Heman for cryptogenic stroke  . LOOP RECORDER IMPLANT N/A 03/19/2013   Procedure: LOOP RECORDER IMPLANT;  Surgeon: Coralyn Mark, MD;  Location: [facility] CATH LAB;  Service: Cardiovascular;  Laterality: N/A;  . NEPHRECTOMY TRANSPLANTED ORGAN    . NEUROPLASTY / TRANSPOSITION MEDIAN NERVE AT CARPAL TUNNEL BILATERAL    . right leg surgery     pin in place  . right wrist surgery    . TEE WITHOUT CARDIOVERSION N/A 03/19/2013   Procedure: TRANSESOPHAGEAL ECHOCARDIOGRAM (TEE);  Surgeon: [name], MD;  Location: [facility] ENDOSCOPY;  Service: Cardiovascular;  Laterality: N/A;  . THYROID SURGERY  10/13/2015   Performed at [facility] with surgical pathology  revealed consistent with benign follicular nodule ([name] category II)     reports that he quit smoking about 6 years ago. His smoking use included cigarettes. He has a 20.00 pack-year smoking history. He has never used smokeless tobacco. He reports that he does not drink alcohol or use drugs.  Allergies  Allergen Reactions  . Lisinopril Other (See Comments)    Increased potassium level   . Meloxicam Nausea And Vomiting  . Victoza [Liraglutide] Diarrhea, Nausea And Vomiting and Swelling    Family History  Problem Relation Age of Onset  . Hypertension Mother   . Hyperlipidemia Father   . Hypertension Father   . Heart disease Father        before age 52  . Renal Disease Father   . Diabetes Brother   . Hyperlipidemia Son     Prior to Admission medications   Medication Sig Start Date End Date Taking? Authorizing Provider  aspirin EC 81 MG tablet Take 81 mg by mouth daily.     [provider]  atorvastatin (LIPITOR) 40 MG tablet Take 40 mg by mouth daily.     [provider]  calcitRIOL (ROCALTROL) 0.25 MCG capsule Take 0.25 mcg by mouth See admin instructions. Take 1 capsule (0.25 mcg) by mouth five times weekly - Monday thru Friday    [provider]  gabapentin (NEURONTIN) 300 MG capsule Take 300 mg by mouth 2 (two) times daily.    [provider]  HYDROcodone-acetaminophen (NORCO) 10-325 MG tablet Take 1-2 tablets by mouth every 6 (six) hours.  12/26/16   [provider]  insulin glargine (LANTUS) 100 unit/mL SOPN Inject 24 Units into the skin at bedtime.     [provider]  insulin lispro (HUMALOG KWIKPEN) 100 UNIT/ML KiwkPen Inject 5-15 Units into the skin See admin instructions. Inject 5 units subcutaneously three times daily before meals adjusted per carb count - add 2 units for every 25 points    [provider]  labetalol (NORMODYNE) 100 MG tablet Take 100 mg by mouth 2 (two) times daily.    [provider]  loperamide (IMODIUM) 2 MG capsule Take 1 capsule (2 mg total) by mouth every 12 (twelve) hours. 12/31/18   Nita Sells, MD  methimazole (TAPAZOLE) 5 MG tablet Take 2.5 mg by mouth daily.  06/29/17   [provider]  Multiple Vitamin (MULTIVITAMIN WITH MINERALS) TABS tablet Take 1 tablet by mouth daily.    [provider]  Multiple Vitamin (STRESS B PO) Take 1 tablet by mouth daily.    [provider]  mycophenolate (MYFORTIC) 180 MG EC tablet Take 180 mg by mouth 2 (two) times daily.     [provider]  pantoprazole (PROTONIX) 40  MG tablet Take 1 tablet (40 mg total) by mouth daily. 10/09/17   Jackquline Denmark, MD  predniSONE (DELTASONE) 5 MG tablet Take 5 mg by mouth daily with breakfast.    [provider]  [facility] Wort 300 MG TABS Take 300 mg by mouth 3 (three) times daily.    [provider]  tacrolimus (PROGRAF) 1 MG capsule Take 2 mg by mouth 2 (two) times daily. 06/28/17   [provider]  tamsulosin (FLOMAX) 0.4 MG CAPS capsule Take 0.4 mg by mouth at bedtime.    [provider]  TRADJENTA 5 MG TABS tablet Take 5 mg by mouth daily. 04/17/18   [provider]  vitamin B-12 (CYANOCOBALAMIN) 1000 MCG tablet Take 1,000 mcg by mouth 2 (two) times daily.     [provider]    Physical Exam: Constitutional: Moderately built and nourished. Vitals:   01/03/19 2335 01/04/19 0000 01/04/19 0015  BP:  (!) 145/74   Pulse:  84 80  Resp:  20 17  Temp: 98.3 F (36.8 C)    TempSrc: Oral    SpO2:  98% 97%   Eyes: Nonicteric no pallor. ENMT: No discharge from the ears eyes nose or mouth. Neck: No mass felt.  No neck rigidity. Respiratory: No rhonchi or crepitations. Cardiovascular: S1-S2 heard. Abdomen: Soft nontender bowel sounds present. Musculoskeletal: No edema.  No joint effusion. Skin: No rash. Neurologic: Alert awake oriented to time place and person.  Moves all  extremities. Psychiatric: Appears normal per normal affect.   Labs on Admission: I have personally reviewed following labs and imaging studies  CBC: Recent Labs  Lab 12/29/18 0542 12/30/18 0731 12/31/18 0403 01/04/19 0011  WBC 4.1 8.8 9.4 6.1  NEUTROABS 3.8 8.1* 8.8* 5.1  HGB 11.6* 11.9* 12.4* 11.8*  HCT 35.8* 36.0* 38.3* 36.9*  MCV 91.8 91.8 92.1 93.4  PLT 108* 127* 135* 151   Basic Metabolic Panel: Recent Labs  Lab 12/29/18 0542 12/30/18 0731 12/31/18 0403 01/04/19 0011  NA 142 147* 144 141  K 5.8* 5.3* 5.1 5.7*  CL 119* 124* 118* 118*  CO2 13* 16* 16* 16*  GLUCOSE 254* 215* 198* 166*  BUN 62* 53* 49* 76*  CREATININE 2.69* 1.97* 2.08* 2.95*  CALCIUM 8.6* 8.4* 8.5* 8.7*  MG 2.1 1.9 1.9  --    GFR: Estimated Creatinine Clearance: 29.9 mL/min (A) (by C-G formula based on SCr of 2.95 mg/dL (H)). Liver Function Tests: Recent Labs  Lab 12/29/18 0542 12/30/18 0731 12/31/18 0403 01/04/19 0011  AST 53* 36 31 85*  ALT 60* 50* 45* 106*  ALKPHOS 50 45 46 64  BILITOT 0.7 0.4 0.7 0.7  PROT 6.2* 5.8* 6.1* 6.1*  ALBUMIN 2.8* 2.6* 2.8* 2.5*   No results for input(s): LIPASE, AMYLASE in the last 168 hours. No results for input(s): AMMONIA in the last 168 hours. Coagulation Profile: No results for input(s): INR, PROTIME in the last 168 hours. Cardiac Enzymes: No results for input(s): CKTOTAL, CKMB, CKMBINDEX, TROPONINI in the last 168 hours. BNP (last 3 results) No results for input(s): PROBNP in the last 8760 hours. HbA1C: No results for input(s): HGBA1C in the last 72 hours. CBG: Recent Labs  Lab 12/30/18 1237 12/30/18 1742 12/30/18 2000 12/31/18 0745 12/31/18 1225  GLUCAP 155* 185* 178* 202* 201*   Lipid Profile: Recent Labs    01/04/19 0011  TRIG 113   Thyroid Function Tests: No results for input(s): TSH, T4TOTAL, FREET4, T3FREE, THYROIDAB in the last 72 hours.  Anemia Panel: Recent Labs    01/04/19 0011  FERRITIN 1,247*   Urine analysis:     Component Value Date/Time   COLORURINE YELLOW 12/28/2018 0300   APPEARANCEUR HAZY (A) 12/28/2018 0300   LABSPEC 1.019 12/28/2018 0300   PHURINE 5.0 12/28/2018 0300   GLUCOSEU NEGATIVE 12/28/2018 0300   HGBUR SMALL (A) 12/28/2018 0300   BILIRUBINUR NEGATIVE 12/28/2018 0300   KETONESUR NEGATIVE 12/28/2018 0300   PROTEINUR 30 (A) 12/28/2018 0300   UROBILINOGEN 0.2 01/23/2011 0551   NITRITE NEGATIVE 12/28/2018 0300   LEUKOCYTESUR NEGATIVE 12/28/2018 0300   Sepsis Labs: @LABRCNTIP (procalcitonin:4,lacticidven:4) ) Recent Results (from the past 240 hour(s))  Blood Culture (routine x 2)     Status: None   Collection Time: 12/27/18  2:13 PM   Specimen: BLOOD  Result Value Ref Range Status   Specimen Description BLOOD RIGHT ANTECUBITAL  Final   Special Requests   Final    BOTTLES DRAWN AEROBIC AND ANAEROBIC Blood Culture adequate volume   Culture   Final    NO GROWTH 5 DAYS Performed at [facility], [place] [address], [place], [place] [address]    Report Status 01/01/2019 FINAL  Final  Blood Culture (routine x 2)     Status: None   Collection Time: 12/27/18  2:21 PM   Specimen: BLOOD RIGHT HAND  Result Value Ref Range Status   Specimen Description BLOOD RIGHT HAND  Final   Special Requests   Final    BOTTLES DRAWN AEROBIC AND ANAEROBIC Blood Culture adequate volume   Culture   Final    NO GROWTH 5 DAYS Performed at [facility], [place] [address], [place], [place] [address]    Report Status 01/01/2019 FINAL  Final  C Difficile Quick Screen w PCR reflex     Status: None   Collection Time: 12/28/18  9:11 AM   Specimen: Stool  Result Value Ref Range Status   C Diff antigen NEGATIVE NEGATIVE Final   C Diff toxin NEGATIVE NEGATIVE Final   C Diff interpretation No C. difficile detected.  Final    Comment: Performed at [facility], [place] [address] [place], [place] [address]  GI pathogen panel by PCR, stool     Status: None   Collection Time: 12/28/18  9:57 PM    Specimen: STOOL  Result Value Ref Range Status   Plesiomonas shigelloides NOT DETECTED NOT DETECTED Final   Yersinia enterocolitica NOT DETECTED NOT DETECTED Final   Vibrio NOT DETECTED NOT DETECTED Final   Enteropathogenic E coli NOT DETECTED NOT DETECTED Final   E coli (ETEC) LT/ST NOT DETECTED NOT DETECTED Final   E coli 7616 by PCR Not applicable NOT DETECTED Final   Cryptosporidium by PCR NOT DETECTED NOT DETECTED Final   Entamoeba histolytica NOT DETECTED NOT DETECTED Final   Adenovirus F 40/41 NOT DETECTED NOT DETECTED Final   Norovirus GI/GII NOT DETECTED NOT DETECTED Final   Sapovirus NOT DETECTED NOT DETECTED Final    Comment: (NOTE) Performed At: [facility] [place], [place] [id number] Rush Farmer MD RS:[id number]    Vibrio cholerae NOT DETECTED NOT DETECTED Final   Campylobacter by PCR NOT DETECTED NOT DETECTED Final   Salmonella by PCR NOT DETECTED NOT DETECTED Final   E coli (STEC) NOT DETECTED NOT DETECTED Final   Enteroaggregative E coli NOT DETECTED NOT DETECTED Final   Shigella by PCR NOT DETECTED NOT DETECTED Final   Cyclospora cayetanensis NOT DETECTED NOT DETECTED  Final   Astrovirus NOT DETECTED NOT DETECTED Final   G lamblia by PCR NOT DETECTED NOT DETECTED Final   Rotavirus A by PCR NOT DETECTED NOT DETECTED Final     Radiological Exams on Admission: DG Chest Port 1 View  Result Date: 01/04/2019 CLINICAL DATA:  Shortness of breath EXAM: PORTABLE CHEST 1 VIEW COMPARISON:  December 27, 2018 FINDINGS: There is mild cardiomegaly. Increased interstitial markings are seen throughout both lungs. No pleural effusion or pneumothorax. No acute osseous abnormality. IMPRESSION: Increased interstitial markings throughout both lungs which could be due to interstitial edema and/or infectious etiology. Electronically Signed   By: Prudencio Pair M.D.   On: 01/04/2019 00:48    EKG: Independently reviewed.  Normal sinus  rhythm.  Assessment/Plan Principal Problem:   Acute respiratory failure with hypoxia (HCC) Active Problems:   Pulmonary hypertension, unspecified (HCC)   Type 2 diabetes mellitus without complications ([place])   AKI (acute kidney injury) ([place])   COVID-19 virus infection   Acute kidney injury superimposed on chronic kidney disease ([place])    1. Acute respiratory failure with hypoxia secondary to COVID-19 infection for which I have started patient on IV Decadron and remdesivir.  Closely monitor his respiratory status including markers. 2. Acute on chronic kidney disease stage IV with hyperkalemia for which I have placed patient on renal diet along with carb modified.  We will gently hydrate for few hours and recheck metabolic panel.  On Myfortic tacrolimus and IV steroids now.  May need renal input if there is any further worsening of creatinine. 3. Elevated LFTs could be from Covid infection.  Closely monitor.  Will check sonogram of the abdomen.  Note that patient is on methimazole statins and if LFTs worsen may need to hold it. 4. Diabetes mellitus type 2 on Lantus insulin dose of which may have to be decreased if creatinine worsens.  Patient is on Decadron closely follow CBGs. 5. Normocytic normochromic anemia appears to be chronic likely from renal disease follow CBC. 6. Neuropathy on gabapentin dose of which has to be decreased if creatinine worsens. 7. History of hypothyroidism on methimazole.  If LFTs worsen may have to hold methimazole. 8. History of hypertension on labetalol. 9. History of stroke on aspirin and statins.  Statins may need to be withheld if there is worsening of LFTs.  Given that patient has acute respiratory failure with hypoxia with Covid pneumonia will need inpatient status.   DVT prophylaxis: Heparin. Code Status: Full code. Family Communication: Patient's wife. Disposition Plan: Home. Consults called: None. Admission status: Inpatient.   Rise Patience  MD [facility] Pager [phone].  If 7PM-7AM, please contact night-coverage [email or web address] Password [facility]  01/04/2019, 3:57 AM

## 2019-01-04 NOTE — ED Notes (Signed)
+  Tele [phone] pts wife please call

## 2019-01-04 NOTE — ED Notes (Signed)
Patient wife Herbert Pun calling asking for update [phone] please call

## 2019-01-04 NOTE — ED Provider Notes (Addendum)
TIME SEEN: 12:11 AM  CHIEF COMPLAINT: Shortness of breath  HPI: Patient is a 68 year old male with history of CHF, chronic kidney disease status post transplant, hypertension, hyperlipidemia, previous stroke who presents to the emergency department with shortness of breath worse with exertion.  Symptoms worsened today.  Diagnosed with COVID-19 on 12/20/2018.  Was admitted to the hospital 12/27/2018 through 12/31/2018 for acute kidney injury.  Did not require oxygen during that hospitalization.  Found to have sats of 83% with EMS tonight.  Does not wear oxygen chronically.  No chest pain.  Has not had a fever in days.  No vomiting or diarrhea.  Herbert Pun - wife - [phone]  ROS: See HPI Constitutional: no fever  Eyes: no drainage  ENT: no runny nose   Cardiovascular:  no chest pain  Resp:  SOB  GI: no vomiting GU: no dysuria Integumentary: no rash  Allergy: no hives  Musculoskeletal: no leg swelling  Neurological: no slurred speech ROS otherwise negative  PAST MEDICAL HISTORY/PAST SURGICAL HISTORY:  Past Medical History:  Diagnosis Date  . Abnormal gait   . Anemia of chronic disease   . Arthritis    back, hands   . Chronic back pain    radiculopathy and stenosis  . Chronic ischemic heart disease    Severe LVH  . Chronic kidney disease    [name], awaiting Transplant   . Congestive heart failure (CHF) ([place])   . CVA (cerebral infarction) 03/2013  . Diabetes mellitus     Lantus nightly ;type 2  . GERD (gastroesophageal reflux disease)   . H/O hiatal hernia   . History of colon polyps   . Hx of cardiovascular stress test    a.  Lexiscan Myoview (05/2013):  No ischemia, EF 53%; Normal Study  . Hyperlipidemia    takes Lovastatin daily  . Hypertension    takes Amlodipine and Catapres daily    dr Forrestine Him, [facility] - [facility]  . Nocturia   . Obesity   . Peripheral edema    takes Lasix daily  . Sleep apnea    cpap , study in their home, 09/2102- [facility]            . Stroke ([facility])    speech, rt arm weakness  . Urinary frequency     MEDICATIONS:  Prior to Admission medications   Medication Sig Start Date End Date Taking? Authorizing Provider  aspirin EC 81 MG tablet Take 81 mg by mouth daily.     [provider]  atorvastatin (LIPITOR) 40 MG tablet Take 40 mg by mouth daily.     [provider]  calcitRIOL (ROCALTROL) 0.25 MCG capsule Take 0.25 mcg by mouth See admin instructions. Take 1 capsule (0.25 mcg) by mouth five times weekly - Monday thru Friday    [provider]  gabapentin (NEURONTIN) 300 MG capsule Take 300 mg by mouth 2 (two) times daily.    [provider]  HYDROcodone-acetaminophen (NORCO) 10-325 MG tablet Take 1-2 tablets by mouth every 6 (six) hours.  12/26/16   [provider]  insulin glargine (LANTUS) 100 unit/mL SOPN Inject 24 Units into the skin at bedtime.     [provider]  insulin lispro (HUMALOG KWIKPEN) 100 UNIT/ML KiwkPen Inject 5-15 Units into the skin See admin instructions. Inject 5 units subcutaneously three times daily before meals adjusted per carb count - add 2 units for every 25 points    [provider]  labetalol (NORMODYNE) 100 MG tablet  Take 100 mg by mouth 2 (two) times daily.    [provider]  loperamide (IMODIUM) 2 MG capsule Take 1 capsule (2 mg total) by mouth every 12 (twelve) hours. 12/31/18   Nita Sells, MD  methimazole (TAPAZOLE) 5 MG tablet Take 2.5 mg by mouth daily.  06/29/17   [provider]  Multiple Vitamin (MULTIVITAMIN WITH MINERALS) TABS tablet Take 1 tablet by mouth daily.    [provider]  Multiple Vitamin (STRESS B PO) Take 1 tablet by mouth daily.    [provider]  mycophenolate (MYFORTIC) 180 MG EC tablet Take 180 mg by mouth 2 (two) times daily.     [provider]  pantoprazole (PROTONIX) 40 MG tablet Take 1 tablet (40 mg total) by mouth daily. 10/09/17   Jackquline Denmark,  MD  predniSONE (DELTASONE) 5 MG tablet Take 5 mg by mouth daily with breakfast.    [provider]  [facility] Wort 300 MG TABS Take 300 mg by mouth 3 (three) times daily.    [provider]  tacrolimus (PROGRAF) 1 MG capsule Take 2 mg by mouth 2 (two) times daily. 06/28/17   [provider]  tamsulosin (FLOMAX) 0.4 MG CAPS capsule Take 0.4 mg by mouth at bedtime.    [provider]  TRADJENTA 5 MG TABS tablet Take 5 mg by mouth daily. 04/17/18   [provider]  vitamin B-12 (CYANOCOBALAMIN) 1000 MCG tablet Take 1,000 mcg by mouth 2 (two) times daily.     [provider]    ALLERGIES:  Allergies  Allergen Reactions  . Lisinopril Other (See Comments)    Increased potassium level   . Meloxicam Nausea And Vomiting  . Victoza [Liraglutide] Diarrhea, Nausea And Vomiting and Swelling    SOCIAL HISTORY:  Social History   Tobacco Use  . Smoking status: Former Smoker    Packs/day: 0.50    Years: 40.00    Pack years: 20.00    Types: Cigarettes    Quit date: 12/27/2012    Years since quitting: 6.0  . Smokeless tobacco: Never Used  Substance Use Topics  . Alcohol use: No    Alcohol/week: 0.0 standard drinks    FAMILY HISTORY: Family History  Problem Relation Age of Onset  . Hypertension Mother   . Hyperlipidemia Father   . Hypertension Father   . Heart disease Father        before age 54  . Renal Disease Father   . Diabetes Brother   . Hyperlipidemia Son     EXAM: BP (!) 145/74   Pulse 80   Temp 98.3 F (36.8 C) (Oral)   Resp 17   SpO2 97%  CONSTITUTIONAL: Alert and oriented and responds appropriately to questions.  Obese, appears short of breath HEAD: Normocephalic EYES: Conjunctivae clear, pupils appear equal, EOM appear intact ENT: normal nose; moist mucous membranes NECK: Supple, normal ROM CARD: RRR; S1 and S2 appreciated; no murmurs, no clicks, no rubs, no gallops RESP: Normal chest excursion without splinting  or tachypnea; breath sounds clear and equal bilaterally; no wheezes, no rhonchi, no rales, patient appears short of breath when talking but sats are stable in the upper 90s on 4 L nasal cannula ABD/GI: Normal bowel sounds; non-distended; soft, non-tender, no rebound, no guarding, no peritoneal signs, no hepatosplenomegaly BACK:  The back appears normal EXT: Normal ROM in all joints; no deformity noted, no edema; no cyanosis SKIN: Normal color for age and race; warm; no rash  on exposed skin NEURO: Moves all extremities equally PSYCH: The patient's mood and manner are appropriate.   MEDICAL DECISION MAKING: Patient here with shortness of breath.  Suspect secondary to COVID-19 infection.  Will obtain labs, chest x-ray.  Given patient's new oxygen requirement, he will require readmission.  ED PROGRESS: Patient's labs show AKI on superimposed chronic kidney disease.  Also has hyperkalemia with potassium of 5.7 without EKG changes.  Chest x-ray concerning for increased interstitial markings consistent with COVID-19.  Doubt this is from CHF.  His BNP is 60 and he has no peripheral edema, JVD on exam.  Will give IV fluids for his AKI and hyperkalemia.  Will admit to medicine given his respiratory failure with hypoxia.  Attempted to update patient's wife by phone.  Left message at [phone].   1:48 AM Discussed patient's case with hospitalist, Dr. Hal Hope.  I have recommended admission and patient (and family if present) agree with this plan. Admitting physician will place admission orders.   I reviewed all nursing notes, vitals, pertinent previous records and interpreted all EKGs, lab and urine results, imaging (as available).   5:39 AM  Spoke with wife and updated with patient's status.  Patient's wife's number was listed incorrectly in the chart previously by secretary.  Correct number [phone].  CRITICAL CARE Performed by: Pryor Curia   Total critical care time: 45 minutes  Critical  care time was exclusive of separately billable procedures and treating other patients.  Critical care was necessary to treat or prevent imminent or life-threatening deterioration.  Critical care was time spent personally by me on the following activities: development of treatment plan with patient and/or surrogate as well as nursing, discussions with consultants, evaluation of patient's response to treatment, examination of patient, obtaining history from patient or surrogate, ordering and performing treatments and interventions, ordering and review of laboratory studies, ordering and review of radiographic studies, pulse oximetry and re-evaluation of patient's condition.   Jon [name] was evaluated in Emergency Department on 01/04/2019 for the symptoms described in the history of present illness. He was evaluated in the context of the global COVID-19 pandemic, which necessitated consideration that the patient might be at risk for infection with the SARS-CoV-2 virus that causes COVID-19. Institutional protocols and algorithms that pertain to the evaluation of patients at risk for COVID-19 are in a state of rapid change based on information released by regulatory bodies including the CDC and federal and state organizations. These policies and algorithms were followed during the patient's care in the ED.  Patient was seen wearing N95, face shield, gloves, gown.    EKG Interpretation  Date/Time:  Thursday January 03 2019 23:38:49 EST Ventricular Rate:  80 PR Interval:    QRS Duration: 81 QT Interval:  354 QTC Calculation: 409 R Axis:   7 Text Interpretation: Sinus rhythm Borderline low voltage, extremity leads No significant change since last tracing Confirmed by Pryor Curia [phone]) on 01/03/2019 11:41:31 PM         Noura Purpura, Delice Bison, DO 01/04/19 0148    Carroll Lingelbach, Delice Bison, DO 01/04/19 [id number]

## 2019-01-04 NOTE — Progress Notes (Signed)
Same day note  Patient seen and examined at bedside.  Patient was admitted to the hospital for shortness of breath.  At the time of my evaluation, patient complains of shortness of breath.  Physical examination reveals no noted distress.  On nasal cannula oxygen.  Laboratory data and imaging was reviewed over the last 24 hours.  Assessment and Plan.  Acute respiratory failure with hypoxia secondary to COVID-19 infection - on IV Decadron and remdesivir.  Monitor inflammatory markers.  Chest x-ray showed interstitial markings.  Patient was recently discharged 3 days ago after being admitted for COVID-19 infection with diarrhea.  Patient had normal BNP on presentation. Afebrile at this time. Patient is a high risk for Covid infection severity due to underlying organ transplant status, diabetes.  COVID-19 Labs  Recent Labs    01/04/19 0011 01/04/19 0800  DDIMER 7.96* 7.04*  FERRITIN 1,247* 1,339*  LDH 328*  --   CRP 16.5* 15.9*   Acute kidney injury on chronic kidney disease stage IV with hyperkalemia-status post renal transplant. Creatinine of 2.9 on presentation and has improved to 2.6.  Baseline creatinine 1.8-2.3.  On  renal diet along with carb modified. Continue hydration and recheck metabolic panel.  On Myfortic, tacrolimus and IV steroids now.  May need renal input if there is any further worsening of creatinine.  Will wean oxygen as able. Will give one dose of   Elevated LFTs could be from Covid infection.  Closely monitor.  sonogram of the abdomen showed coarse hepatic echotexture.   on methimazole statins and if LFTs worsen may need to hold it.  Mildly elevated LFTs.  Will trend.  Hepatic Function Latest Ref Rng & Units 01/04/2019 01/04/2019 12/31/2018  Total Protein 6.5 - 8.1 g/dL 5.7(L) 6.1(L) 6.1(L)  Albumin 3.5 - 5.0 g/dL 2.3(L) 2.5(L) 2.8(L)  AST 15 - 41 U/L 55(H) 85(H) 31  ALT 0 - 44 U/L 91(H) 106(H) 45(H)  Alk Phosphatase 38 - 126 U/L 59 64 46  Total Bilirubin 0.3 -  1.2 mg/dL 0.2(L) 0.7 0.7  Bilirubin, Direct 0.0 - 0.2 mg/dL 0.1 - -    Diabetes mellitus type 2 on Lantus , sliding scale insulin.  Closely monitor on dexamethasone. Diabetic diet.  Normocytic normochromic anemia likely anemia of CKD.  No evidence of bleeding.  Close monitor.  Peripheral neuropathy on gabapentin-on decreased dose at this time.   History of hypothyroidism on methimazole.  If LFTs worsen may have to hold methimazole.  Essential  hypertension on labetalol.  Will closely monitor blood pressure.  History of cryptogenic stroke on aspirin and statins.  Statins may need to be withheld if there is worsening of LFTs.  Family communication: I called the patient's spouse Ms. Tommi Emery on the mobile phone informed her about the clinical condition of the patient.  No Charge  Signed,  Delila Pereyra, MD [facility]

## 2019-01-04 NOTE — ED Notes (Signed)
Lunch Tray Ordered @ 1033. 

## 2019-01-05 ENCOUNTER — Other Ambulatory Visit: Payer: Self-pay

## 2019-01-05 LAB — CBG MONITORING, ED
Glucose-Capillary: 289 mg/dL — ABNORMAL HIGH (ref 70–99)
Glucose-Capillary: 296 mg/dL — ABNORMAL HIGH (ref 70–99)
Glucose-Capillary: 322 mg/dL — ABNORMAL HIGH (ref 70–99)

## 2019-01-05 MED ORDER — FUROSEMIDE 10 MG/ML IJ SOLN
40.0000 mg | Freq: Two times a day (BID) | INTRAMUSCULAR | Status: DC
  Administered 2019-01-05 – 2019-01-07 (×5): 40 mg via INTRAVENOUS
  Filled 2019-01-05 (×5): qty 4

## 2019-01-05 MED ORDER — HYDRALAZINE HCL 20 MG/ML IJ SOLN: 10.0000 mg | Freq: Four times a day (QID) | INTRAMUSCULAR | Status: DC | PRN

## 2019-01-05 MED ORDER — SODIUM ZIRCONIUM CYCLOSILICATE 10 G PO PACK
10.0000 g | PACK | Freq: Three times a day (TID) | ORAL | Status: AC
  Administered 2019-01-05 (×2): 10 g via ORAL
  Filled 2019-01-05 (×3): qty 1

## 2019-01-05 NOTE — ED Notes (Signed)
Adjusted pt in bed. Pt was able to slide himself up and sit up however became dyspneic/ hypoxic on exertion.  SpO2 dropped to 86% while on 2 liters.

## 2019-01-05 NOTE — ED Notes (Signed)
Just gave patient a bath. Changed of linen and gown. Set up lunch tray/ snack

## 2019-01-05 NOTE — ED Notes (Signed)
Tele   Breakfast ordered  

## 2019-01-05 NOTE — Consult Note (Signed)
[facility] Renal Consultation Note  Requesting MD: Pokhrel Indication for Consultation: s/p renal transplant, COVID-  Hyperkalemia and volume overload   HPI:  Jon [name] is a 68 y.o. 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  Followed by Jon [name] at [facility]-he does have some allograft dysfunction and creatinine seems to run in the low twos.  He also has hyperthyroidism, diabetes, hyperlipidemia.   last seen at [facility] on November 9 creatinine was 2.4 and he was stable.  Patient developed Covid, was hospitalized from 12/17 to 12/21 for symptom management.  Regarding kidneys, he did have some acute on chronic renal failure with initial creatinine 3.4 that improved to around 2 at the time of discharge.  I guess he was felt to be dry so diuretics were held as well as Bactrim.  Unfortunately, he had return to the emergency department yesterday with complaints of worsening shortness of breath.  Chest x-ray showing interstitial changes either infectious or fluid.  Creatinine was 2.95 yesterday, 2.66 today.  BNP is normal.  His blood pressure however is high and he has required nasal cannula oxygen so has been admitted.  I am asked to help with his CKD/volume status and hyperkalemia  Creat  Date/Time Value Ref Range Status  12/22/2016 02:42 PM 2.21 (H) 0.70 - 1.25 mg/dL Final    Comment:    For patients >77 years of age, the reference limit for Creatinine is approximately 13% higher for people identified as African-American. .    Creatinine, Ser  Date/Time Value Ref Range Status  01/04/2019 08:00 AM 2.66 (H) 0.61 - 1.24 mg/dL Final  01/04/2019 12:11 AM 2.95 (H) 0.61 - 1.24 mg/dL Final  12/31/2018 04:03 AM 2.08 (H) 0.61 - 1.24 mg/dL Final  12/30/2018 07:31 AM 1.97 (H) 0.61 - 1.24 mg/dL Final  12/29/2018 05:42 AM 2.69 (H) 0.61 - 1.24 mg/dL Final  12/28/2018 03:25 AM 3.25 (H) 0.61 - 1.24 mg/dL Final  12/27/2018  02:11 PM 3.42 (H) 0.61 - 1.24 mg/dL Final  07/26/2017 02:51 PM 1.60 (H) 0.61 - 1.24 mg/dL Final  06/18/2017 12:46 PM 2.57 (H) 0.61 - 1.24 mg/dL Final  06/05/2017 03:39 AM 2.60 (H) 0.61 - 1.24 mg/dL Final  06/04/2017 08:38 AM 3.56 (H) 0.61 - 1.24 mg/dL Final  01/04/2017 04:44 AM 2.74 (H) 0.61 - 1.24 mg/dL Final  01/03/2017 03:51 AM 2.72 (H) 0.61 - 1.24 mg/dL Final  01/02/2017 12:30 AM 2.44 (H) 0.61 - 1.24 mg/dL Final  01/01/2017 07:00 PM 2.54 (H) 0.61 - 1.24 mg/dL Final  10/01/2015 11:15 AM 1.86 (H) 0.61 - 1.24 mg/dL Final  04/19/2015 09:10 AM 2.39 (H) 0.61 - 1.24 mg/dL Final  03/16/2013 03:02 AM 4.71 (H) 0.50 - 1.35 mg/dL Final  12/28/2012 05:00 AM 4.01 (H) 0.50 - 1.35 mg/dL Final  12/20/2012 10:32 AM 4.20 (H) 0.50 - 1.35 mg/dL Final  01/27/2011 05:45 AM 1.98 (H) 0.50 - 1.35 mg/dL Final  01/26/2011 05:49 AM 1.89 (H) 0.50 - 1.35 mg/dL Final  01/25/2011 02:18 PM 1.93 (H) 0.50 - 1.35 mg/dL Final  01/25/2011 06:50 AM 1.81 (H) 0.50 - 1.35 mg/dL Final  01/24/2011 12:42 AM 2.12 (H) 0.50 - 1.35 mg/dL Final  01/23/2011 01:00 PM 2.09 (H) 0.50 - 1.35 mg/dL Final  01/22/2011 09:29 PM 1.87 (H) 0.50 - 1.35 mg/dL Final     PMHx:   Past Medical History:  Diagnosis Date  . Abnormal gait   . Anemia of chronic disease   .  Arthritis    back, hands   . Chronic back pain    radiculopathy and stenosis  . Chronic ischemic heart disease    Severe LVH  . Chronic kidney disease    [name], awaiting Transplant   . Congestive heart failure (CHF) ([facility])   . CVA (cerebral infarction) 03/2013  . Diabetes mellitus     Lantus nightly ;type 2  . GERD (gastroesophageal reflux disease)   . H/O hiatal hernia   . History of colon polyps   . Hx of cardiovascular stress test    a.  Lexiscan Myoview (05/2013):  No ischemia, EF 53%; Normal Study  . Hyperlipidemia    takes Lovastatin daily  . Hypertension    takes Amlodipine and Catapres daily    dr Forrestine Him, Loop Recorder - [facility]  . Nocturia   .  Obesity   . Peripheral edema    takes Lasix daily  . Sleep apnea    cpap , study in their home, 09/2102- [facility]           . Stroke (HCC)    speech, rt arm weakness  . Urinary frequency     Past Surgical History:  Procedure Laterality Date  . AV FISTULA PLACEMENT Left 04/03/2013   Procedure: ARTERIOVENOUS (AV) FISTULA CREATION- LEFT RADIOCEPHALIC VS BRACHIOCEPHALIC;  Surgeon: Conrad Goldfield, MD;  Location: [facility];  Service: Vascular;  Laterality: Left;  . AV FISTULA PLACEMENT Left 05/14/2013   Procedure: LEFT BRACHIOCEPHALIC ARTERIOVENOUS (AV) FISTULA CREATION;  Surgeon: Conrad Quincy, MD;  Location: [facility];  Service: Vascular;  Laterality: Left;  . BACK SURGERY  12/2012   2003- 1st back surgery & then 2014- fusion  . COLONOSCOPY    . ESOPHAGOGASTRODUODENOSCOPY    . HEMORRHOID SURGERY    . KIDNEY TRANSPLANT  10/01/2013  . left knee surgery    . LOOP RECORDER IMPLANT  03/19/13   MDT LinQ implanted by Dr Rayann Heman for cryptogenic stroke  . LOOP RECORDER IMPLANT N/A 03/19/2013   Procedure: LOOP RECORDER IMPLANT;  Surgeon: Coralyn Mark, MD;  Location: [facility];  Service: Cardiovascular;  Laterality: N/A;  . NEPHRECTOMY TRANSPLANTED ORGAN    . NEUROPLASTY / TRANSPOSITION MEDIAN NERVE AT CARPAL TUNNEL BILATERAL    . right leg surgery     pin in place  . right wrist surgery    . TEE WITHOUT CARDIOVERSION N/A 03/19/2013   Procedure: TRANSESOPHAGEAL ECHOCARDIOGRAM (TEE);  Surgeon: [name], MD;  Location: [facility] ENDOSCOPY;  Service: Cardiovascular;  Laterality: N/A;  . THYROID SURGERY  10/13/2015   Performed at [facility] with surgical pathology revealed consistent with benign follicular nodule ([name] category II)    Family Hx:  Family History  Problem Relation Age of Onset  . Hypertension Mother   . Hyperlipidemia Father   . Hypertension Father   . Heart disease Father        before age 67  . Renal Disease Father   . Diabetes Brother   . Hyperlipidemia Son     Social History:   reports that he quit smoking about 6 years ago. His smoking use included cigarettes. He has a 20.00 pack-year smoking history. He has never used smokeless tobacco. He reports that he does not drink alcohol or use drugs.  Allergies:  Allergies  Allergen Reactions  . Lisinopril Other (See Comments)    Increased potassium level   . Meloxicam Nausea And Vomiting  . Victoza [Liraglutide] Diarrhea, Nausea And Vomiting and Swelling  Medications: Prior to Admission medications   Medication Sig Start Date End Date Taking? Authorizing Provider  aspirin EC 81 MG tablet Take 81 mg by mouth daily.    Yes [provider]  atorvastatin (LIPITOR) 20 MG tablet Take 40 mg by mouth at bedtime. 11/26/18  Yes [provider]  calcitRIOL (ROCALTROL) 0.25 MCG capsule Take 0.25 mcg by mouth See admin instructions. Take 1 capsule (0.25 mcg) by mouth five times weekly - Monday thru Friday   Yes [provider]  gabapentin (NEURONTIN) 300 MG capsule Take 300 mg by mouth 2 (two) times daily.   Yes [provider]  HYDROcodone-acetaminophen (NORCO) 10-325 MG tablet Take 1-2 tablets by mouth every 4 (four) hours as needed for moderate pain.  12/26/16  Yes [provider]  insulin glargine (LANTUS) 100 unit/mL SOPN Inject 24 Units into the skin at bedtime.    Yes [provider]  insulin lispro (HUMALOG KWIKPEN) 100 UNIT/ML KiwkPen Inject 5-15 Units into the skin See admin instructions. Inject 5 units subcutaneously three times daily before meals adjusted per carb count - add 2 units for every 25 points   Yes [provider]  labetalol (NORMODYNE) 100 MG tablet Take 100 mg by mouth 2 (two) times daily.   Yes [provider]  loperamide (IMODIUM) 2 MG capsule Take 1 capsule (2 mg total) by mouth every 12 (twelve) hours. 12/31/18  Yes Nita Sells, MD  methimazole (TAPAZOLE) 5 MG tablet Take 2.5 mg by mouth daily.  06/29/17  Yes [provider]  Multiple Vitamin (MULTIVITAMIN WITH MINERALS) TABS tablet Take 1 tablet by mouth daily.   Yes [provider]  Multiple Vitamin (STRESS B PO) Take 1 tablet by mouth daily.   Yes [provider]  mycophenolate (MYFORTIC) 180 MG EC tablet Take 180 mg by mouth 2 (two) times daily.    Yes [provider]  predniSONE (DELTASONE) 5 MG tablet Take 5 mg by mouth daily with breakfast.   Yes [provider]  [place] Wort 300 MG TABS Take 300 mg by mouth 3 (three) times daily.   Yes [provider]  tacrolimus (PROGRAF) 1 MG capsule Take 2 mg by mouth 2 (two) times daily. 06/28/17  Yes [provider]  tamsulosin (FLOMAX) 0.4 MG CAPS capsule Take 0.4 mg by mouth at bedtime.   Yes [provider]  TRADJENTA 5 MG TABS tablet Take 5 mg by mouth daily. 04/17/18  Yes [provider]  vitamin B-12 (CYANOCOBALAMIN) 1000 MCG tablet Take 1,000 mcg by mouth 2 (two) times daily.    Yes [provider]  pantoprazole (PROTONIX) 40 MG tablet Take 1 tablet (40 mg total) by mouth daily. Patient not taking: Reported on 01/04/2019 10/09/17   Jackquline Denmark, MD    I have reviewed the patient's current medications.  Labs:  Results for orders placed or performed during the hospital encounter of 01/03/19 (from the past 48 hour(s))  Blood Culture (routine x 2)     Status: None (Preliminary result)   Collection Time: 01/03/19 11:55 PM   Specimen: BLOOD  Result Value Ref Range   Specimen Description BLOOD RIGHT UPPER ARM    Special Requests      BOTTLES DRAWN AEROBIC AND ANAEROBIC Blood Culture results may not be optimal due to an inadequate volume of blood received in culture bottles   Culture      NO GROWTH 1 DAY Performed at [facility], [place]  [address], [place], [place] [address]    Report Status PENDING   Lactic acid, plasma     Status: None   Collection Time: 01/04/19 12:11 AM  Result Value Ref Range   Lactic Acid,  Venous 1.1 0.5 - 1.9 mmol/L    Comment: Performed at [place] [facility] [address], [place], [place] [address]  CBC WITH DIFFERENTIAL     Status: Abnormal   Collection Time: 01/04/19 12:11 AM  Result Value Ref Range   WBC 6.1 4.0 - 10.5 K/uL   RBC 3.95 (L) 4.22 - 5.81 MIL/uL   Hemoglobin 11.8 (L) 13.0 - 17.0 g/dL   HCT 36.9 (L) 39.0 - 52.0 %   MCV 93.4 80.0 - 100.0 fL   MCH 29.9 26.0 - 34.0 pg   MCHC 32.0 30.0 - 36.0 g/dL   RDW 13.8 11.5 - 15.5 %   Platelets 198 150 - 400 K/uL    Comment: REPEATED TO VERIFY   nRBC 0.0 0.0 - 0.2 %   Neutrophils Relative % 83 %   Neutro Abs 5.1 1.7 - 7.7 K/uL   Lymphocytes Relative 5 %   Lymphs Abs 0.3 (L) 0.7 - 4.0 K/uL   Monocytes Relative 9 %   Monocytes Absolute 0.6 0.1 - 1.0 K/uL   Eosinophils Relative 2 %   Eosinophils Absolute 0.1 0.0 - 0.5 K/uL   Basophils Relative 0 %   Basophils Absolute 0.0 0.0 - 0.1 K/uL   Immature Granulocytes 1 %   Abs Immature Granulocytes 0.04 0.00 - 0.07 K/uL    Comment: Performed at [facility], [place] [address], [place], [place] [address]  Comprehensive metabolic panel     Status: Abnormal   Collection Time: 01/04/19 12:11 AM  Result Value Ref Range   Sodium 141 135 - 145 mmol/L   Potassium 5.7 (H) 3.5 - 5.1 mmol/L   Chloride 118 (H) 98 - 111 mmol/L   CO2 16 (L) 22 - 32 mmol/L   Glucose, Bld 166 (H) 70 - 99 mg/dL   BUN 76 (H) 8 - 23 mg/dL   Creatinine, Ser 2.95 (H) 0.61 - 1.24 mg/dL   Calcium 8.7 (L) 8.9 - 10.3 mg/dL   Total Protein 6.1 (L) 6.5 - 8.1 g/dL   Albumin 2.5 (L) 3.5 - 5.0 g/dL   AST 85 (H) 15 - 41 U/L   ALT 106 (H) 0 - 44 U/L   Alkaline Phosphatase 64 38 - 126 U/L   Total Bilirubin 0.7 0.3 - 1.2 mg/dL   GFR calc non Af Amer 21 (L) >60 mL/min   GFR calc Af Amer 24 (L) >60 mL/min   Anion gap 7 5 - 15    Comment: Performed at [facility], [place] [address], [place], [place] [address]  D-dimer, quantitative     Status: Abnormal   Collection Time: 01/04/19 12:11 AM  Result Value  Ref Range   D-Dimer, Quant 7.96 (H) 0.00 - 0.50 ug/mL-FEU    Comment: (NOTE) At the manufacturer cut-off of 0.50 ug/mL FEU, this assay has been documented to exclude PE with a sensitivity and negative predictive value of 97 to 99%.  At this time, this assay has not been approved by the FDA to exclude DVT/VTE. Results should be correlated with clinical presentation. Performed at [facility], [place] [address] [place], [place] [address]   Procalcitonin     Status: None   Collection Time: 01/04/19 12:11 AM  Result Value Ref  Range   Procalcitonin 0.18 ng/mL    Comment:        Interpretation: PCT (Procalcitonin) <= 0.5 ng/mL: Systemic infection (sepsis) is not likely. Local bacterial infection is possible. (NOTE)       Sepsis PCT Algorithm           Lower Respiratory Tract                                      Infection PCT Algorithm    ----------------------------     ----------------------------         PCT < 0.25 ng/mL                PCT < 0.10 ng/mL         Strongly encourage             Strongly discourage   discontinuation of antibiotics    initiation of antibiotics    ----------------------------     -----------------------------       PCT 0.25 - 0.50 ng/mL            PCT 0.10 - 0.25 ng/mL               OR       >80% decrease in PCT            Discourage initiation of                                            antibiotics      Encourage discontinuation           of antibiotics    ----------------------------     -----------------------------         PCT >= 0.50 ng/mL              PCT 0.26 - 0.50 ng/mL               AND        <80% decrease in PCT             Encourage initiation of                                             antibiotics       Encourage continuation           of antibiotics    ----------------------------     -----------------------------        PCT >= 0.50 ng/mL                  PCT > 0.50 ng/mL               AND         increase in PCT                   Strongly encourage                                      initiation of antibiotics    Strongly encourage escalation           of antibiotics                                     -----------------------------  PCT <= 0.25 ng/mL                                                 OR                                        > 80% decrease in PCT                                     Discontinue / Do not initiate                                             antibiotics Performed at [facility], [place] [address] [place], [place] [address]   Lactate dehydrogenase     Status: Abnormal   Collection Time: 01/04/19 12:11 AM  Result Value Ref Range   LDH 328 (H) 98 - 192 U/L    Comment: Performed at [facility], [place] [address] [place], [place] [address]  Ferritin     Status: Abnormal   Collection Time: 01/04/19 12:11 AM  Result Value Ref Range   Ferritin 1,247 (H) 24 - 336 ng/mL    Comment: Performed at [facility], [place] [address] [place], [place] [address]  Triglycerides     Status: None   Collection Time: 01/04/19 12:11 AM  Result Value Ref Range   Triglycerides 113 <150 mg/dL    Comment: Performed at [place] [address], [place], [place] [address]  Fibrinogen     Status: Abnormal   Collection Time: 01/04/19 12:11 AM  Result Value Ref Range   Fibrinogen 708 (H) 210 - 475 mg/dL    Comment: Performed at [place] [address] [place], [place] [address]  C-reactive protein     Status: Abnormal   Collection Time: 01/04/19 12:11 AM  Result Value Ref Range   CRP 16.5 (H) <1.0 mg/dL    Comment: Performed at [facility] [address] [place], [place] [address]  Brain natriuretic peptide     Status: None   Collection Time: 01/04/19 12:12 AM  Result Value Ref Range   B Natriuretic Peptide 59.0 0.0 - 100.0 pg/mL    Comment: Performed at [place] [address] [place], [place] [address]   Hepatic function panel     Status: Abnormal   Collection Time: 01/04/19  8:00 AM  Result Value Ref Range   Total Protein 5.7 (L) 6.5 - 8.1 g/dL   Albumin 2.3 (L) 3.5 - 5.0 g/dL   AST 55 (H) 15 - 41 U/L   ALT 91 (H) 0 - 44 U/L   Alkaline Phosphatase 59 38 - 126 U/L   Total Bilirubin 0.2 (L) 0.3 - 1.2 mg/dL   Bilirubin, Direct 0.1 0.0 - 0.2 mg/dL   Indirect Bilirubin 0.1 (L) 0.3 - 0.9 mg/dL    Comment: Performed at [facility] [address], [place], [place] [address]  Basic metabolic panel     Status: Abnormal  Collection Time: 01/04/19  8:00 AM  Result Value Ref Range   Sodium 146 (H) 135 - 145 mmol/L   Potassium 5.9 (H) 3.5 - 5.1 mmol/L   Chloride 120 (H) 98 - 111 mmol/L   CO2 15 (L) 22 - 32 mmol/L   Glucose, Bld 167 (H) 70 - 99 mg/dL   BUN 70 (H) 8 - 23 mg/dL   Creatinine, Ser 2.66 (H) 0.61 - 1.24 mg/dL   Calcium 8.6 (L) 8.9 - 10.3 mg/dL   GFR calc non Af Amer 24 (L) >60 mL/min   GFR calc Af Amer 28 (L) >60 mL/min   Anion gap 11 5 - 15    Comment: Performed at [facility] [address], [place], [place] [address]  CBC with Differential/Platelet     Status: Abnormal   Collection Time: 01/04/19  8:00 AM  Result Value Ref Range   WBC 5.5 4.0 - 10.5 K/uL   RBC 3.72 (L) 4.22 - 5.81 MIL/uL   Hemoglobin 11.2 (L) 13.0 - 17.0 g/dL   HCT 35.2 (L) 39.0 - 52.0 %   MCV 94.6 80.0 - 100.0 fL   MCH 30.1 26.0 - 34.0 pg   MCHC 31.8 30.0 - 36.0 g/dL   RDW 14.0 11.5 - 15.5 %   Platelets 181 150 - 400 K/uL   nRBC 0.0 0.0 - 0.2 %   Neutrophils Relative % 90 %   Neutro Abs 5.0 1.7 - 7.7 K/uL   Lymphocytes Relative 3 %   Lymphs Abs 0.2 (L) 0.7 - 4.0 K/uL   Monocytes Relative 5 %   Monocytes Absolute 0.3 0.1 - 1.0 K/uL   Eosinophils Relative 1 %   Eosinophils Absolute 0.1 0.0 - 0.5 K/uL   Basophils Relative 0 %   Basophils Absolute 0.0 0.0 - 0.1 K/uL   Immature Granulocytes 1 %   Abs Immature Granulocytes 0.05 0.00 - 0.07 K/uL    Comment: Performed at [place] [address], [place], [place] [address]  C-reactive protein     Status: Abnormal   Collection Time: 01/04/19  8:00 AM  Result Value Ref Range   CRP 15.9 (H) <1.0 mg/dL    Comment: Performed at [facility] [address] [place], [place] [address]  Ferritin     Status: Abnormal   Collection Time: 01/04/19  8:00 AM  Result Value Ref Range   Ferritin 1,339 (H) 24 - 336 ng/mL    Comment: Performed at [facility], [place] [address], [place], [place] [address]  D-dimer, quantitative (not at [facility])     Status: Abnormal   Collection Time: 01/04/19  8:00 AM  Result Value Ref Range   D-Dimer, Quant 7.04 (H) 0.00 - 0.50 ug/mL-FEU    Comment: (NOTE) At the manufacturer cut-off of 0.50 ug/mL FEU, this assay has been documented to exclude PE with a sensitivity and negative predictive value of 97 to 99%.  At this time, this assay has not been approved by the FDA to exclude DVT/VTE. Results should be correlated with clinical presentation. Performed at [facility], [place] [address] [place],  [address]   CBG monitoring, ED     Status: Abnormal   Collection Time: 01/04/19  9:13 AM  Result Value Ref Range   Glucose-Capillary 157 (H) 70 - 99 mg/dL  CBG monitoring, ED     Status: Abnormal   Collection Time: 01/04/19  1:35 PM  Result Value Ref Range  Glucose-Capillary 251 (H) 70 - 99 mg/dL  CBG monitoring, ED     Status: Abnormal   Collection Time: 01/04/19  5:13 PM  Result Value Ref Range   Glucose-Capillary 248 (H) 70 - 99 mg/dL  CBG monitoring, ED     Status: Abnormal   Collection Time: 01/04/19  9:46 PM  Result Value Ref Range   Glucose-Capillary 256 (H) 70 - 99 mg/dL  CBG monitoring, ED     Status: Abnormal   Collection Time: 01/05/19  9:35 AM  Result Value Ref Range   Glucose-Capillary 289 (H) 70 - 99 mg/dL  CBG monitoring, ED     Status: Abnormal   Collection Time: 01/05/19 12:20 PM  Result Value Ref Range   Glucose-Capillary 296 (H) 70 - 99 mg/dL      ROS:  A comprehensive review of systems was negative except for: Respiratory: positive for dyspnea on exertion  Physical Exam: Vitals:   01/05/19 1000 01/05/19 1131  BP: (!) 184/85 (!) 158/80  Pulse: 72 74  Resp: 17 15  Temp:    SpO2: 90% 96%     Due to the nature of this patient's COVID-19 with isolation and in keeping with efforts to prevent the spread of infection and to conserve personal protective equipment, a physical exam was not personally performed. A chart review of other providers notes and the patient's lab work as well as review of other pertinent studies was performed   Assessment/Plan: 68 year old white male status post renal transplant with some CKD and creatinine of 2.  Recent Covid infection treated medically but now is bounced back to the hospital with worsening respiratory symptoms 1.Renal- patient is status post renal transplant in 2015.  His baseline creatinine is around 2 at best.  Therefore, his kidney function is around his baseline.  In addition, kidney function is trending better than yesterday.  He is making urine.  Does not appear to be on review of the chart uremic.   2.  Status post renal transplant-continue his home medications of Prograf 2 mg twice daily and Myfortic 180 mg twice daily 3.  Covid infection- Per hospitalist.  Is being retreated with remdesivir and Decadron 3. Hypertension/volume  -blood pressure is high so he is likely not dry.  It would be reasonable to add some diuresis to the mix as that may help with the potassium as well- will order.  Is also on his other home BP meds  4.  Hyperkalemia-has been started on scheduled Lokelma.  Continue to follow potassium 5. Anemia  -not a significant issue at this time   Louis Meckel 01/05/2019, 12:55 PM

## 2019-01-05 NOTE — Progress Notes (Addendum)
PROGRESS NOTE  Jon [name] MHD:[id number] DOB: [birth year] DOA: 01/03/2019 PCP: [name], MD   LOS: 1 day   Brief narrative: Jon [name] is a 68 y.o. male with history of renal transplant stage IV kidney disease, diabetes mellitus type 2, hypertension and history of stroke who was  discharged 3 days prior to the being admitted for Covid 19 infection with diarrhea was empirically treated with IV fluids at that time patient's Lasix and Bactrim were withheld and discharged home after patient's creatinine improved and diarrhea stopped.  Patient after reaching home became more short of breath and since it was persistent patient came back to the ER but denies any chest pain or fever chills.  No productive cough.  ED Course: Chest x-ray in the ER shows interstitial changes which could be either from Covid infection or fluid overload.  Labs show creatinine of 2.9 which is increased from 2 about 4 days ago potassium 5.7 blood glucose 166 AST and ALT also has increased.  LDH 328 ferritin 1247 and CRP 16.5 procalcitonin 0.18 BNP 59.  Hemoglobin slightly decreased from baseline is around 11.8 WBC count 6.1 EKG shows normal sinus rhythm.  Given the acute respiratory failure with hypoxia, chest x-ray showed infiltrates with normal BNP likely by Covid pneumonia.  Patient was started on IV Decadron and remdesivir and admitted for further management.  Assessment/Plan:  Principal Problem:   Acute respiratory failure with hypoxia (HCC) Active Problems:   Pulmonary hypertension, unspecified (HCC)   Type 2 diabetes mellitus without complications ([place])   AKI (acute kidney injury) ([place])   COVID-19 virus infection   Acute kidney injury superimposed on chronic kidney disease ([place])  Acute respiratory failure with hypoxia secondary to COVID-19 infection - on IV Decadron and remdesivir.  Currently on 3 L of oxygen by nasal cannula saturating 96%. Monitor inflammatory markers.  Chest x-ray showed  interstitial markings.  Patient was recently discharged 3 days prior to this presentation after being admitted for COVID-19 infection with diarrhea.  Patient had normal BNP on this presentation. Afebrile at this time. Patient is a high risk for Covid infection severity due to underlying organ transplant status, diabetes.  COVID-19 Labs  Recent Labs    01/04/19 0011 01/04/19 0800  DDIMER 7.96* 7.04*  FERRITIN 1,247* 1,339*  LDH 328*  --   CRP 16.5* 15.9*    Acute kidney injury on chronic kidney disease stage IV with hyperkalemia-status post renal transplant. Creatinine of 2.9 on presentation and has improved to 2.6.    BMP from today pending.  Baseline creatinine 1.8-2.3.  On  renal diet along with carb modified.  Received IV fluids initially. On Myfortic, tacrolimus and IV steroids.  Will obtain nephrology consultation due to renal transplant status on immunosuppressants with Covid disease, hyperkalemia and acute kidney injury. Will wean oxygen as able.  Patient received  Lokelma yesterday.  We will continue further doses today due to increasing hyperkalemia.  Elevated LFTs could be from Covid infection. sonogram of the abdomen showed coarse hepatic echotexture.  on methimazole, statins.  If LFTs worsen, may need to hold it.   Trending down LFTs today.  COVID-19 Labs  Recent Labs    01/04/19 0011 01/04/19 0800  DDIMER 7.96* 7.04*  FERRITIN 1,247* 1,339*  LDH 328*  --   CRP 16.5* 15.9*    No results found for: [place]  Diabetes mellitus type 2 with hyperglycemia.  Likely exacerbated by steroids.  On Lantus,  sliding scale insulin.  Add meal time insulin.  Adjust  dose of Lantus.  continue Diabetic diet.  Normocytic normochromic anemia likely anemia of CKD.  No evidence of bleeding.  Close monitor.  Peripheral neuropathy on gabapentin-on decreased dose at this time.   History of hypothyroidism on methimazole.   Essential  hypertension on labetalol.  Will closely  monitor blood pressure. Add prn hydralazine today.  History of cryptogenic stroke on aspirin and statins. Statins may need to be withheld if there is worsening of LFTs.  VTE Prophylaxis: Heparin subcu   Code Status:  Full code  Family Communication: I again called the patient's spouse Ms. Tommi Emery on the mobile phone informed her about the clinical condition of the patient.  Disposition Plan: Home in 3-4 days. Continue IV remdesivir, dexamethasone. Follow nephrology recommendations.  Consultants:  nephrology  Procedures:  none  Antibiotics: Anti-infectives (From admission, onward)   Start     Dose/Rate Route Frequency Ordered Stop   01/05/19 1000  remdesivir 100 mg in sodium chloride 0.9 % 100 mL IVPB     100 mg 200 mL/hr over 30 Minutes Intravenous Daily 01/04/19 0556 01/09/19 0959   01/04/19 0645  remdesivir 200 mg in sodium chloride 0.9% 250 mL IVPB     200 mg 580 mL/hr over 30 Minutes Intravenous Once 01/04/19 0556 01/04/19 0758      Subjective: Today, patient feels ok, denies nausea, vomiting or abdominal pain.  Denies fever chills or rigor. Denies overt dyspnea.  No chest pain.  Objective: Vitals:   01/05/19 1000 01/05/19 1131  BP: (!) 184/85 (!) 158/80  Pulse: 72 74  Resp: 17 15  Temp:    SpO2: 90% 96%    Intake/Output Summary (Last 24 hours) at 01/05/2019 1221 Last data filed at 01/04/2019 1344 Gross per 24 hour  Intake --  Output 550 ml  Net -550 ml   There were no vitals filed for this visit. There is no height or weight on file to calculate BMI.   Physical Exam: GENERAL: Patient is alert awake and oriented. Not in obvious distress.  On nasal cannula oxygen at 3 L/min. HENT: No scleral pallor or icterus. Pupils equally reactive to light. Oral mucosa is moist NECK: is supple, no palpable thyroid enlargement. CHEST: Clear to auscultation. No crackles or wheezes. Non tender on palpation. Diminished breath sounds bilaterally. CVS: S1 and S2  heard, no murmur. Regular rate and rhythm. No pericardial rub. ABDOMEN: Soft, non-tender, bowel sounds are present. No palpable hepato-splenomegaly. EXTREMITIES: Trace edema CNS: Cranial nerves are intact. No focal motor or sensory deficits. SKIN: warm and dry without rashes.  Data Review: I have personally reviewed the following laboratory data and studies,  CBC: Recent Labs  Lab 12/30/18 0731 12/31/18 0403 01/04/19 0011 01/04/19 0800  WBC 8.8 9.4 6.1 5.5  NEUTROABS 8.1* 8.8* 5.1 5.0  HGB 11.9* 12.4* 11.8* 11.2*  HCT 36.0* 38.3* 36.9* 35.2*  MCV 91.8 92.1 93.4 94.6  PLT 127* 135* 198 696   Basic Metabolic Panel: Recent Labs  Lab 12/30/18 0731 12/31/18 0403 01/04/19 0011 01/04/19 0800  NA 147* 144 141 146*  K 5.3* 5.1 5.7* 5.9*  CL 124* 118* 118* 120*  CO2 16* 16* 16* 15*  GLUCOSE 215* 198* 166* 167*  BUN 53* 49* 76* 70*  CREATININE 1.97* 2.08* 2.95* 2.66*  CALCIUM 8.4* 8.5* 8.7* 8.6*  MG 1.9 1.9  --   --    Liver Function Tests: Recent Labs  Lab 12/30/18 0731 12/31/18 0403 01/04/19 0011  01/04/19 0800  AST 36 31 85* 55*  ALT 50* 45* 106* 91*  ALKPHOS 45 46 64 59  BILITOT 0.4 0.7 0.7 0.2*  PROT 5.8* 6.1* 6.1* 5.7*  ALBUMIN 2.6* 2.8* 2.5* 2.3*   No results for input(s): LIPASE, AMYLASE in the last 168 hours. No results for input(s): AMMONIA in the last 168 hours. Cardiac Enzymes: No results for input(s): CKTOTAL, CKMB, CKMBINDEX, TROPONINI in the last 168 hours. BNP (last 3 results) Recent Labs    01/04/19 0012  BNP 59.0    ProBNP (last 3 results) No results for input(s): PROBNP in the last 8760 hours.  CBG: Recent Labs  Lab 01/04/19 0913 01/04/19 1335 01/04/19 1713 01/04/19 2146 01/05/19 0935  GLUCAP 157* 251* 248* 256* 289*   Recent Results (from the past 240 hour(s))  Blood Culture (routine x 2)     Status: None   Collection Time: 12/27/18  2:13 PM   Specimen: BLOOD  Result Value Ref Range Status   Specimen Description BLOOD RIGHT  ANTECUBITAL  Final   Special Requests   Final    BOTTLES DRAWN AEROBIC AND ANAEROBIC Blood Culture adequate volume   Culture   Final    NO GROWTH 5 DAYS Performed at [facility], [address] [place], [place] [address]    Report Status 01/01/2019 FINAL  Final  Blood Culture (routine x 2)     Status: None   Collection Time: 12/27/18  2:21 PM   Specimen: BLOOD RIGHT HAND  Result Value Ref Range Status   Specimen Description BLOOD RIGHT HAND  Final   Special Requests   Final    BOTTLES DRAWN AEROBIC AND ANAEROBIC Blood Culture adequate volume   Culture   Final    NO GROWTH 5 DAYS Performed at [facility], [place] [address] [place], [place] [address]    Report Status 01/01/2019 FINAL  Final  C Difficile Quick Screen w PCR reflex     Status: None   Collection Time: 12/28/18  9:11 AM   Specimen: Stool  Result Value Ref Range Status   C Diff antigen NEGATIVE NEGATIVE Final   C Diff toxin NEGATIVE NEGATIVE Final   C Diff interpretation No C. difficile detected.  Final    Comment: Performed at [facility], [place] [address], [place], [place] [address]  GI pathogen panel by PCR, stool     Status: None   Collection Time: 12/28/18  9:57 PM   Specimen: STOOL  Result Value Ref Range Status   Plesiomonas shigelloides NOT DETECTED NOT DETECTED Final   Yersinia enterocolitica NOT DETECTED NOT DETECTED Final   Vibrio NOT DETECTED NOT DETECTED Final   Enteropathogenic E coli NOT DETECTED NOT DETECTED Final   E coli (ETEC) LT/ST NOT DETECTED NOT DETECTED Final   E coli 1601 by PCR Not applicable NOT DETECTED Final   Cryptosporidium by PCR NOT DETECTED NOT DETECTED Final   Entamoeba histolytica NOT DETECTED NOT DETECTED Final   Adenovirus F 40/41 NOT DETECTED NOT DETECTED Final   Norovirus GI/GII NOT DETECTED NOT DETECTED Final   Sapovirus NOT DETECTED NOT DETECTED Final    Comment: (NOTE) Performed At: [facility] [place], [place]  [id number] Rush Farmer MD UK:[id number]    Vibrio cholerae NOT DETECTED NOT DETECTED Final   Campylobacter by PCR NOT DETECTED NOT DETECTED Final   Salmonella by PCR NOT DETECTED NOT DETECTED Final   E coli (STEC) NOT DETECTED NOT DETECTED  Final   Enteroaggregative E coli NOT DETECTED NOT DETECTED Final   Shigella by PCR NOT DETECTED NOT DETECTED Final   Cyclospora cayetanensis NOT DETECTED NOT DETECTED Final   Astrovirus NOT DETECTED NOT DETECTED Final   G lamblia by PCR NOT DETECTED NOT DETECTED Final   Rotavirus A by PCR NOT DETECTED NOT DETECTED Final  Blood Culture (routine x 2)     Status: None (Preliminary result)   Collection Time: 01/03/19 11:55 PM   Specimen: BLOOD  Result Value Ref Range Status   Specimen Description BLOOD RIGHT UPPER ARM  Final   Special Requests   Final    BOTTLES DRAWN AEROBIC AND ANAEROBIC Blood Culture results may not be optimal due to an inadequate volume of blood received in culture bottles   Culture   Final    NO GROWTH 1 DAY Performed at [facility], [address], [place], [place] [address]    Report Status PENDING  Incomplete     Studies: DG Chest Port 1 View  Result Date: 01/04/2019 CLINICAL DATA:  Shortness of breath EXAM: PORTABLE CHEST 1 VIEW COMPARISON:  December 27, 2018 FINDINGS: There is mild cardiomegaly. Increased interstitial markings are seen throughout both lungs. No pleural effusion or pneumothorax. No acute osseous abnormality. IMPRESSION: Increased interstitial markings throughout both lungs which could be due to interstitial edema and/or infectious etiology. Electronically Signed   By: Prudencio Pair M.D.   On: 01/04/2019 00:48   US Abdomen Limited RUQ  Result Date: 01/04/2019 CLINICAL DATA:  68 year old male with abnormal LFTs. Positive COVID-19. History of renal transplant. EXAM: ULTRASOUND ABDOMEN LIMITED RIGHT UPPER QUADRANT COMPARISON:  CT Abdomen and Pelvis 11/25/2017. Renal ultrasound 12/27/2018. FINDINGS:  Gallbladder: No gallstones or wall thickening visualized. No sonographic Murphy sign noted by sonographer. Common bile duct: Diameter: 3 millimeters, normal. Liver: Dense liver echotexture, difficult to penetrate. Hepatic steatosis is possible. No discrete liver lesion. No intrahepatic biliary ductal dilatation. Portal vein is patent on color Doppler imaging with normal direction of blood flow towards the liver. Other: Poorly visible atrophic native right kidney. IMPRESSION: 1. Coarse hepatic echotexture suggesting steatosis and/or chronic hepatocellular disease. 2. No discrete liver lesion, evidence of bile duct obstruction, or gallbladder abnormality. Electronically Signed   By: Genevie Ann M.D.   On: 01/04/2019 08:53    Scheduled Meds: . aspirin EC  81 mg Oral Daily  . atorvastatin  40 mg Oral Daily  . calcitRIOL  0.25 mcg Oral See admin instructions  . dexamethasone (DECADRON) injection  6 mg Intravenous Q24H  . gabapentin  300 mg Oral BID  . heparin  5,000 Units Subcutaneous Q8H  . HYDROcodone-acetaminophen  1-2 tablet Oral Q6H  . insulin aspart  0-9 Units Subcutaneous TID WC  . insulin glargine  24 Units Subcutaneous QHS  . labetalol  100 mg Oral BID  . linagliptin  5 mg Oral Daily  . methimazole  2.5 mg Oral Daily  . multivitamin with minerals  1 tablet Oral Daily  . mycophenolate  180 mg Oral BID  . sodium zirconium cyclosilicate  10 g Oral TID  . tacrolimus  2 mg Oral BID  . tamsulosin  0.4 mg Oral QHS  . vitamin B-12  1,000 mcg Oral BID    Continuous Infusions: . remdesivir 100 mg in NS 100 mL 100 mg (01/05/19 0945)     Flora Lipps, MD  [facility] 01/05/2019

## 2019-01-05 NOTE — ED Notes (Signed)
Pt CBG is 322 notified Alana, RN

## 2019-01-05 NOTE — ED Notes (Signed)
Updated pt wife on plan for care. The wife very worried and tearful during conversation. Wife verbalized understanding that pt was being admitting to hospital. Wife also explained pt has dentures in his bad and will need them to eat.

## 2019-01-05 NOTE — ED Notes (Signed)
Pt is NSR w/BBB on monitor 

## 2019-01-05 NOTE — ED Notes (Signed)
CBG 284

## 2019-01-06 DIAGNOSIS — E875 Hyperkalemia: Secondary | ICD-10-CM

## 2019-01-06 DIAGNOSIS — J188 Other pneumonia, unspecified organism: Secondary | ICD-10-CM

## 2019-01-06 DIAGNOSIS — I639 Cerebral infarction, unspecified: Secondary | ICD-10-CM

## 2019-01-06 DIAGNOSIS — J1282 Pneumonia due to coronavirus disease 2019: Secondary | ICD-10-CM

## 2019-01-06 DIAGNOSIS — U071 COVID-19: Secondary | ICD-10-CM

## 2019-01-06 DIAGNOSIS — R7989 Other specified abnormal findings of blood chemistry: Secondary | ICD-10-CM

## 2019-01-06 DIAGNOSIS — I10 Essential (primary) hypertension: Secondary | ICD-10-CM

## 2019-01-06 LAB — CBC WITH DIFFERENTIAL/PLATELET
Abs Immature Granulocytes: 0.02 10*3/uL (ref 0.00–0.07)
Basophils Absolute: 0 10*3/uL (ref 0.0–0.1)
Basophils Relative: 0 %
Eosinophils Absolute: 0 10*3/uL (ref 0.0–0.5)
Eosinophils Relative: 0 %
HCT: 37.1 % — ABNORMAL LOW (ref 39.0–52.0)
Hemoglobin: 11.9 g/dL — ABNORMAL LOW (ref 13.0–17.0)
Immature Granulocytes: 0 %
Lymphocytes Relative: 3 %
Lymphs Abs: 0.2 10*3/uL — ABNORMAL LOW (ref 0.7–4.0)
MCH: 29.6 pg (ref 26.0–34.0)
MCHC: 32.1 g/dL (ref 30.0–36.0)
MCV: 92.3 fL (ref 80.0–100.0)
Monocytes Absolute: 0.4 10*3/uL (ref 0.1–1.0)
Monocytes Relative: 6 %
Neutro Abs: 6.5 10*3/uL (ref 1.7–7.7)
Neutrophils Relative %: 91 %
Platelets: 202 10*3/uL (ref 150–400)
RBC: 4.02 MIL/uL — ABNORMAL LOW (ref 4.22–5.81)
RDW: 13.7 % (ref 11.5–15.5)
WBC: 7.2 10*3/uL (ref 4.0–10.5)
nRBC: 0 % (ref 0.0–0.2)

## 2019-01-06 LAB — COMPREHENSIVE METABOLIC PANEL
ALT: 74 U/L — ABNORMAL HIGH (ref 0–44)
AST: 25 U/L (ref 15–41)
Albumin: 2.5 g/dL — ABNORMAL LOW (ref 3.5–5.0)
Alkaline Phosphatase: 64 U/L (ref 38–126)
Anion gap: 10 (ref 5–15)
BUN: 71 mg/dL — ABNORMAL HIGH (ref 8–23)
CO2: 15 mmol/L — ABNORMAL LOW (ref 22–32)
Calcium: 8.9 mg/dL (ref 8.9–10.3)
Chloride: 119 mmol/L — ABNORMAL HIGH (ref 98–111)
Creatinine, Ser: 2.35 mg/dL — ABNORMAL HIGH (ref 0.61–1.24)
GFR calc Af Amer: 32 mL/min — ABNORMAL LOW (ref >60)
GFR calc non Af Amer: 28 mL/min — ABNORMAL LOW (ref >60)
Glucose, Bld: 255 mg/dL — ABNORMAL HIGH (ref 70–99)
Potassium: 5.4 mmol/L — ABNORMAL HIGH (ref 3.5–5.1)
Sodium: 144 mmol/L (ref 135–145)
Total Bilirubin: 0.1 mg/dL — ABNORMAL LOW (ref 0.3–1.2)
Total Protein: 6.2 g/dL — ABNORMAL LOW (ref 6.5–8.1)

## 2019-01-06 LAB — CBG MONITORING, ED
Glucose-Capillary: 209 mg/dL — ABNORMAL HIGH (ref 70–99)
Glucose-Capillary: 238 mg/dL — ABNORMAL HIGH (ref 70–99)

## 2019-01-06 LAB — GLUCOSE, CAPILLARY
Glucose-Capillary: 258 mg/dL — ABNORMAL HIGH (ref 70–99)
Glucose-Capillary: 274 mg/dL — ABNORMAL HIGH (ref 70–99)

## 2019-01-06 LAB — FERRITIN: Ferritin: 1107 ng/mL — ABNORMAL HIGH (ref 24–336)

## 2019-01-06 LAB — D-DIMER, QUANTITATIVE: D-Dimer, Quant: 5.87 ug/mL-FEU — ABNORMAL HIGH (ref 0.00–0.50)

## 2019-01-06 LAB — MAGNESIUM: Magnesium: 2.2 mg/dL (ref 1.7–2.4)

## 2019-01-06 LAB — C-REACTIVE PROTEIN: CRP: 9.5 mg/dL — ABNORMAL HIGH (ref <1.0)

## 2019-01-06 MED ORDER — SODIUM BICARBONATE 650 MG PO TABS
650.0000 mg | ORAL_TABLET | Freq: Two times a day (BID) | ORAL | Status: DC
  Administered 2019-01-06 – 2019-01-08 (×5): 650 mg via ORAL
  Filled 2019-01-06 (×6): qty 1

## 2019-01-06 MED ORDER — HYDRALAZINE HCL 25 MG PO TABS
25.0000 mg | ORAL_TABLET | Freq: Three times a day (TID) | ORAL | Status: DC
  Administered 2019-01-06 – 2019-01-10 (×11): 25 mg via ORAL
  Filled 2019-01-06 (×11): qty 1

## 2019-01-06 NOTE — ED Notes (Signed)
Contacted pharmacy about Lantus. They were not sure what happened, but are preparing to send now

## 2019-01-06 NOTE — Progress Notes (Signed)
PROGRESS NOTE    Jon [name]  UXN:[id number] DOB: 12-07-1950 DOA: 01/03/2019 PCP: [name], MD   Brief Narrative:  Jon [name] a 68 y.o.malewithhistory of renal transplant stage IV kidney disease, diabetes mellitus type 2, hypertension and history of stroke who was  discharged 3 days prior to the being admitted for Covid 19 infection with diarrhea was empirically treated with IV fluids at that time patient's Lasix and Bactrim were withheld and discharged home after patient's creatinine improved and diarrhea stopped. Patient after reaching home became more short of breath and since it was persistent patient came back to the ER but denies any chest pain or fever chills. No productive cough.  ED Course:Chest x-ray in the ER shows interstitial changes which could be either from Covid infection or fluid overload. Labs show creatinine of 2.9 which is increased from 2 about 4 days ago potassium 5.7 blood glucose 166 AST and ALT also has increased. LDH 328 ferritin 1247 and CRP 16.5 procalcitonin 0.18 BNP 59. Hemoglobin slightly decreased from baseline is around 11.8 WBC count 6.1 EKG shows normal sinus rhythm. Given the acute respiratory failure with hypoxia, chest x-ray showed infiltrates with normal BNP likely by Covid pneumonia.  Patient was started on IV Decadron and remdesivir and admitted for further management.   Assessment & Plan:   Principal Problem:   Acute respiratory failure with hypoxia (HCC) Active Problems:   Pulmonary hypertension, unspecified (HCC)   Type 2 diabetes mellitus without complications ([place])   AKI (acute kidney injury) ([place])   COVID-19 virus infection   Acute kidney injury superimposed on chronic kidney disease (HCC)   Elevated LFTs   Pneumonia due to COVID-19 virus   Cerebrovascular accident (CVA) ([place])  1 acute respiratory failure with hypoxia secondary to COVID-19 infection and concern for possible volume overload Patient noted to  have presented with worsening shortness of breath, noted to be hypoxic, chest x-ray with bilateral interstitial markings.  Patient recently discharged 3 days prior to admission after being admitted for COVID-19 infection which presented as diarrhea at which time patient had a normal BNP.  Patient currently afebrile.  Due to patient's underlying renal transplant patient at high risk for worsening Covid infection.  Patient states some improvement with shortness of breath.  Patient currently on 3 L nasal cannula with sats ranging from 92-96% on 3 L nasal cannula.  Output not recorded.  Inflammatory markers elevated however trending down.  Continue IV Decadron, IV remdesivir, IV Lasix.  Supportive care.  Follow.  2.  Transaminitis Could likely be secondary to Covid infection.  Abdominal ultrasound with a coarse hepatic echotexture.  Patient currently on methimazole and statins.  LFTs trending down.  Follow.  3.  Diabetes mellitus type 2 with hyperglycemia Hemoglobin A1c 7.8 on 12/28/2018.  CBG of 209 this morning.  Elevated CBGs likely exacerbated with steroids.  Continue current dose of Lantus, sliding scale insulin.  Continue current decreased dose of gabapentin.  Follow.  4.  Hypertension Continue current dose of labetalol at 100 mg twice daily.  Patient on IV Lasix.  Start hydralazine 25 mg p.o. 3 times daily for better blood pressure control.  5.  Acute kidney injury on chronic kidney disease stage III status post kidney transplant 2015 Renal function improving and close to baseline.  Nephrology consulted and are following.  Continue tacrolimus, mycophenolate, steroids.  Appreciate nephrology input and recommendations.  6.  Non-anion gap metabolic acidosis Likely secondary to chronic kidney disease.  Place on  oral bicarb tablets.  Follow.  7.  Hyperkalemia Potassium of 5.4.  Patient on Lasix.  Status post Lokelma.  Per nephrology.  8.  History of benign follicular nodule status post thyroid  surgery 10/13/2015 /?hyperthyroidism Continue home regimen methimazole.  Follow.  9.  History of cryptogenic CVA Stable.  Continue aspirin and statin.  PT/OT.  DVT prophylaxis: Heparin Code Status: Full Family Communication: Updated patient.  No family at bedside. Disposition Plan: Likely home when clinically improved.   Consultants:   Nephrology: Dr. Moshe Cipro 01/05/2019  Procedures:   Chest x-ray 01/04/2019    Antimicrobials:   IV remdesivir 01/04/2019>>>>>   Subjective: Patient laying on gurney.  Feels shortness of breath is slowly improving.  Denies any chest pain.  Denies any further diarrhea.  Patient with complaints of fatigue and generalized weakness.  Objective: Vitals:   01/06/19 1400 01/06/19 1445 01/06/19 1530 01/06/19 1800  BP: (!) 167/73 (!) 170/78 (!) 155/76 (!) 170/78  Pulse: 65 69 76 75  Resp: 12 15 18 19   Temp:    97.8 F (36.6 C)  TempSrc:    Oral  SpO2: 98% 97% 92% 95%   No intake or output data in the 24 hours ending 01/06/19 1831 There were no vitals filed for this visit.  Examination:  General exam: Appears calm and comfortable  Respiratory system: Some scattered coarse breath sounds.  No wheezing noted.  No crackles.  Normal respiratory effort.  Cardiovascular system: S1 & S2 heard, RRR. No JVD, murmurs, rubs, gallops or clicks. No pedal edema. Gastrointestinal system: Abdomen is nondistended, soft and nontender. No organomegaly or masses felt. Normal bowel sounds heard. Central nervous system: Alert and oriented. No focal neurological deficits. Extremities: Symmetric 5 x 5 power. Skin: No rashes, lesions or ulcers Psychiatry: Judgement and insight appear normal. Mood & affect appropriate.     Data Reviewed: I have personally reviewed following labs and imaging studies  CBC: Recent Labs  Lab 12/31/18 0403 01/04/19 0011 01/04/19 0800 01/06/19 0426  WBC 9.4 6.1 5.5 7.2  NEUTROABS 8.8* 5.1 5.0 6.5  HGB 12.4* 11.8* 11.2* 11.9*   HCT 38.3* 36.9* 35.2* 37.1*  MCV 92.1 93.4 94.6 92.3  PLT 135* 198 181 921   Basic Metabolic Panel: Recent Labs  Lab 12/31/18 0403 01/04/19 0011 01/04/19 0800 01/06/19 0426  NA 144 141 146* 144  K 5.1 5.7* 5.9* 5.4*  CL 118* 118* 120* 119*  CO2 16* 16* 15* 15*  GLUCOSE 198* 166* 167* 255*  BUN 49* 76* 70* 71*  CREATININE 2.08* 2.95* 2.66* 2.35*  CALCIUM 8.5* 8.7* 8.6* 8.9  MG 1.9  --   --  2.2   GFR: Estimated Creatinine Clearance: 37.6 mL/min (A) (by C-G formula based on SCr of 2.35 mg/dL (H)). Liver Function Tests: Recent Labs  Lab 12/31/18 0403 01/04/19 0011 01/04/19 0800 01/06/19 0426  AST 31 85* 55* 25  ALT 45* 106* 91* 74*  ALKPHOS 46 64 59 64  BILITOT 0.7 0.7 0.2* 0.1*  PROT 6.1* 6.1* 5.7* 6.2*  ALBUMIN 2.8* 2.5* 2.3* 2.5*   No results for input(s): LIPASE, AMYLASE in the last 168 hours. No results for input(s): AMMONIA in the last 168 hours. Coagulation Profile: No results for input(s): INR, PROTIME in the last 168 hours. Cardiac Enzymes: No results for input(s): CKTOTAL, CKMB, CKMBINDEX, TROPONINI in the last 168 hours. BNP (last 3 results) No results for input(s): PROBNP in the last 8760 hours. HbA1C: No results for input(s): HGBA1C in the last 72  hours. CBG: Recent Labs  Lab 01/05/19 0935 01/05/19 1220 01/05/19 1652 01/06/19 0808 01/06/19 1213  GLUCAP 289* 296* 322* 209* 238*   Lipid Profile: Recent Labs    01/04/19 0011  TRIG 113   Thyroid Function Tests: No results for input(s): TSH, T4TOTAL, FREET4, T3FREE, THYROIDAB in the last 72 hours. Anemia Panel: Recent Labs    01/04/19 0800 01/06/19 0426  FERRITIN 1,339* 1,107*   Sepsis Labs: Recent Labs  Lab 01/04/19 0011  PROCALCITON 0.18  LATICACIDVEN 1.1    Recent Results (from the past 240 hour(s))  C Difficile Quick Screen w PCR reflex     Status: None   Collection Time: 12/28/18  9:11 AM   Specimen: Stool  Result Value Ref Range Status   C Diff antigen NEGATIVE NEGATIVE  Final   C Diff toxin NEGATIVE NEGATIVE Final   C Diff interpretation No C. difficile detected.  Final    Comment: Performed at [facility], [place] [address], [place], [place] [address]  GI pathogen panel by PCR, stool     Status: None   Collection Time: 12/28/18  9:57 PM   Specimen: STOOL  Result Value Ref Range Status   Plesiomonas shigelloides NOT DETECTED NOT DETECTED Final   Yersinia enterocolitica NOT DETECTED NOT DETECTED Final   Vibrio NOT DETECTED NOT DETECTED Final   Enteropathogenic E coli NOT DETECTED NOT DETECTED Final   E coli (ETEC) LT/ST NOT DETECTED NOT DETECTED Final   E coli 3557 by PCR Not applicable NOT DETECTED Final   Cryptosporidium by PCR NOT DETECTED NOT DETECTED Final   Entamoeba histolytica NOT DETECTED NOT DETECTED Final   Adenovirus F 40/41 NOT DETECTED NOT DETECTED Final   Norovirus GI/GII NOT DETECTED NOT DETECTED Final   Sapovirus NOT DETECTED NOT DETECTED Final    Comment: (NOTE) Performed At: [name] [place], [place] [id number] Rush Farmer MD CW:[id number]    Vibrio cholerae NOT DETECTED NOT DETECTED Final   Campylobacter by PCR NOT DETECTED NOT DETECTED Final   Salmonella by PCR NOT DETECTED NOT DETECTED Final   E coli (STEC) NOT DETECTED NOT DETECTED Final   Enteroaggregative E coli NOT DETECTED NOT DETECTED Final   Shigella by PCR NOT DETECTED NOT DETECTED Final   Cyclospora cayetanensis NOT DETECTED NOT DETECTED Final   Astrovirus NOT DETECTED NOT DETECTED Final   G lamblia by PCR NOT DETECTED NOT DETECTED Final   Rotavirus A by PCR NOT DETECTED NOT DETECTED Final  Blood Culture (routine x 2)     Status: None (Preliminary result)   Collection Time: 01/03/19 11:55 PM   Specimen: BLOOD  Result Value Ref Range Status   Specimen Description BLOOD RIGHT UPPER ARM  Final   Special Requests   Final    BOTTLES DRAWN AEROBIC AND ANAEROBIC Blood Culture results may not be optimal due to an inadequate volume of  blood received in culture bottles   Culture   Final    NO GROWTH 2 DAYS Performed at [facility], [address], [place], [place] [address]    Report Status PENDING  Incomplete         Radiology Studies: No results found.      Scheduled Meds: . aspirin EC  81 mg Oral Daily  . atorvastatin  40 mg Oral Daily  . calcitRIOL  0.25 mcg Oral See admin instructions  . dexamethasone (DECADRON) injection  6 mg Intravenous Q24H  . furosemide  40 mg  Intravenous Q12H  . gabapentin  300 mg Oral BID  . heparin  5,000 Units Subcutaneous Q8H  . hydrALAZINE  25 mg Oral Q8H  . HYDROcodone-acetaminophen  1-2 tablet Oral Q6H  . insulin aspart  0-9 Units Subcutaneous TID WC  . insulin glargine  24 Units Subcutaneous QHS  . labetalol  100 mg Oral BID  . linagliptin  5 mg Oral Daily  . methimazole  2.5 mg Oral Daily  . multivitamin with minerals  1 tablet Oral Daily  . mycophenolate  180 mg Oral BID  . sodium bicarbonate  650 mg Oral BID  . tacrolimus  2 mg Oral BID  . tamsulosin  0.4 mg Oral QHS  . vitamin B-12  1,000 mcg Oral BID   Continuous Infusions: . remdesivir 100 mg in NS 100 mL Stopped (01/06/19 1023)     LOS: 2 days    Time spent: 40 minutes    Irine Seal, MD [facility]  If 7PM-7AM, please contact night-coverage [email or web address] 01/06/2019, 6:31 PM

## 2019-01-06 NOTE — ED Notes (Signed)
ED TO INPATIENT HANDOFF REPORT  ED Nurse Name and Phone #: Lunette Stands [place] Name/Age/Gender Jon [name] 68 y.o. male Room/Bed: 007C/007C  Code Status   Code Status: Full Code  Home/SNF/Other Home Patient oriented to: self, place, time and situation Is this baseline? Yes   Triage Complete: Triage complete  Chief Complaint Acute renal failure (ARF) ([place]) [N17.9] Acute respiratory failure with hypoxia ([place]) [J96.01]  Triage Note Pt BIB [facility] from home. Tested Positive for COVID-19 Dec. 10th. Progressively worse since then with increased SOB, weakness, and appetite.   Upon EMS arrival, pt was 83% RA and placed on 4L [place] and rose to 97%  Pt currently 90-92% on RA. Placed on 3L [place].  Hx of Kidney Disease. Fistula in L. Arm but not used due to kidney transplant in 2000-2001 per pt.   VSS with EMS.     Allergies Allergies  Allergen Reactions  . Lisinopril Other (See Comments)    Increased potassium level   . Meloxicam Nausea And Vomiting  . Victoza [Liraglutide] Diarrhea, Nausea And Vomiting and Swelling    Level of Care/Admitting Diagnosis ED Disposition    ED Disposition Condition Comment   Admit  Hospital Area: [facility] [[id number]]  Level of Care: Telemetry Medical [[id number]]  Covid Evaluation: Confirmed COVID Positive  Diagnosis: Acute respiratory failure with hypoxia [facility]) [[id number]]  Admitting Physician: Rise Patience [phone]  Attending Physician: Rise Patience [phone]  Estimated length of stay: past midnight tomorrow  Certification:: I certify this patient will need inpatient services for at least 2 midnights       B Medical/Surgery History Past Medical History:  Diagnosis Date  . Abnormal gait   . Anemia of chronic disease   . Arthritis    back, hands   . Chronic back pain    radiculopathy and stenosis  . Chronic ischemic heart disease    Severe LVH  . Chronic kidney disease    [name], awaiting Transplant   .  Congestive heart failure (CHF) ([place])   . CVA (cerebral infarction) 03/2013  . Diabetes mellitus     Lantus nightly ;type 2  . GERD (gastroesophageal reflux disease)   . H/O hiatal hernia   . History of colon polyps   . Hx of cardiovascular stress test    a.  Lexiscan Myoview (05/2013):  No ischemia, EF 53%; Normal Study  . Hyperlipidemia    takes Lovastatin daily  . Hypertension    takes Amlodipine and Catapres daily    dr Forrestine Him, Loop Recorder - [facility]  . Nocturia   . Obesity   . Peripheral edema    takes Lasix daily  . Sleep apnea    cpap , study in their home, 09/2102- [facility]           . Stroke (HCC)    speech, rt arm weakness  . Urinary frequency    Past Surgical History:  Procedure Laterality Date  . AV FISTULA PLACEMENT Left 04/03/2013   Procedure: ARTERIOVENOUS (AV) FISTULA CREATION- LEFT RADIOCEPHALIC VS BRACHIOCEPHALIC;  Surgeon: Conrad West Hammond, MD;  Location: [facility];  Service: Vascular;  Laterality: Left;  . AV FISTULA PLACEMENT Left 05/14/2013   Procedure: LEFT BRACHIOCEPHALIC ARTERIOVENOUS (AV) FISTULA CREATION;  Surgeon: Conrad [place], MD;  Location: [facility];  Service: Vascular;  Laterality: Left;  . BACK SURGERY  12/2012   2003- 1st back surgery & then 2014- fusion  . COLONOSCOPY    . ESOPHAGOGASTRODUODENOSCOPY    .  HEMORRHOID SURGERY    . KIDNEY TRANSPLANT  10/01/2013  . left knee surgery    . LOOP RECORDER IMPLANT  03/19/13   MDT LinQ implanted by Dr Rayann Heman for cryptogenic stroke  . LOOP RECORDER IMPLANT N/A 03/19/2013   Procedure: LOOP RECORDER IMPLANT;  Surgeon: Coralyn Mark, MD;  Location: [facility];  Service: Cardiovascular;  Laterality: N/A;  . NEPHRECTOMY TRANSPLANTED ORGAN    . NEUROPLASTY / TRANSPOSITION MEDIAN NERVE AT CARPAL TUNNEL BILATERAL    . right leg surgery     pin in place  . right wrist surgery    . TEE WITHOUT CARDIOVERSION N/A 03/19/2013   Procedure: TRANSESOPHAGEAL ECHOCARDIOGRAM (TEE);  Surgeon: [name], MD;  Location: [facility]  ENDOSCOPY;  Service: Cardiovascular;  Laterality: N/A;  . THYROID SURGERY  10/13/2015   Performed at [facility] with surgical pathology revealed consistent with benign follicular nodule ([name] category II)     A IV Location/Drains/Wounds Patient Lines/Drains/Airways Status   Active Line/Drains/Airways    Name:   Placement date:   Placement time:   Site:   Days:   Peripheral IV 01/03/19 Right;Upper Arm   01/03/19    2353    Arm   3   Fistula / Graft Left Forearm Arteriovenous fistula   04/03/13    1405    Forearm   2104   Fistula / Graft Left Upper arm Arteriovenous fistula   05/14/13    0826    Upper arm   2063          Intake/Output Last 24 hours No intake or output data in the 24 hours ending 01/06/19 1640  Labs/Imaging Results for orders placed or performed during the hospital encounter of 01/03/19 (from the past 48 hour(s))  CBG monitoring, ED     Status: Abnormal   Collection Time: 01/04/19  5:13 PM  Result Value Ref Range   Glucose-Capillary 248 (H) 70 - 99 mg/dL  CBG monitoring, ED     Status: Abnormal   Collection Time: 01/04/19  9:46 PM  Result Value Ref Range   Glucose-Capillary 256 (H) 70 - 99 mg/dL  CBG monitoring, ED     Status: Abnormal   Collection Time: 01/05/19  9:35 AM  Result Value Ref Range   Glucose-Capillary 289 (H) 70 - 99 mg/dL  CBG monitoring, ED     Status: Abnormal   Collection Time: 01/05/19 12:20 PM  Result Value Ref Range   Glucose-Capillary 296 (H) 70 - 99 mg/dL  CBG monitoring, ED     Status: Abnormal   Collection Time: 01/05/19  4:52 PM  Result Value Ref Range   Glucose-Capillary 322 (H) 70 - 99 mg/dL   Comment 1 Notify RN    Comment 2 Document in Chart   CBC with Differential/Platelet     Status: Abnormal   Collection Time: 01/06/19  4:26 AM  Result Value Ref Range   WBC 7.2 4.0 - 10.5 K/uL   RBC 4.02 (L) 4.22 - 5.81 MIL/uL   Hemoglobin 11.9 (L) 13.0 - 17.0 g/dL   HCT 37.1 (L) 39.0 - 52.0 %   MCV 92.3 80.0 - 100.0 fL   MCH 29.6 26.0 -  34.0 pg   MCHC 32.1 30.0 - 36.0 g/dL   RDW 13.7 11.5 - 15.5 %   Platelets 202 150 - 400 K/uL   nRBC 0.0 0.0 - 0.2 %   Neutrophils Relative % 91 %   Neutro Abs 6.5 1.7 - 7.7 K/uL  Lymphocytes Relative 3 %   Lymphs Abs 0.2 (L) 0.7 - 4.0 K/uL   Monocytes Relative 6 %   Monocytes Absolute 0.4 0.1 - 1.0 K/uL   Eosinophils Relative 0 %   Eosinophils Absolute 0.0 0.0 - 0.5 K/uL   Basophils Relative 0 %   Basophils Absolute 0.0 0.0 - 0.1 K/uL   Immature Granulocytes 0 %   Abs Immature Granulocytes 0.02 0.00 - 0.07 K/uL    Comment: Performed at [facility] [address], [place], [place] [address]  Comprehensive metabolic panel     Status: Abnormal   Collection Time: 01/06/19  4:26 AM  Result Value Ref Range   Sodium 144 135 - 145 mmol/L   Potassium 5.4 (H) 3.5 - 5.1 mmol/L   Chloride 119 (H) 98 - 111 mmol/L   CO2 15 (L) 22 - 32 mmol/L   Glucose, Bld 255 (H) 70 - 99 mg/dL   BUN 71 (H) 8 - 23 mg/dL   Creatinine, Ser 2.35 (H) 0.61 - 1.24 mg/dL   Calcium 8.9 8.9 - 10.3 mg/dL   Total Protein 6.2 (L) 6.5 - 8.1 g/dL   Albumin 2.5 (L) 3.5 - 5.0 g/dL   AST 25 15 - 41 U/L   ALT 74 (H) 0 - 44 U/L   Alkaline Phosphatase 64 38 - 126 U/L   Total Bilirubin 0.1 (L) 0.3 - 1.2 mg/dL   GFR calc non Af Amer 28 (L) >60 mL/min   GFR calc Af Amer 32 (L) >60 mL/min   Anion gap 10 5 - 15    Comment: Performed at [facility], [place] [address], [place], [place] [address]  C-reactive protein     Status: Abnormal   Collection Time: 01/06/19  4:26 AM  Result Value Ref Range   CRP 9.5 (H) <1.0 mg/dL    Comment: Performed at [facility] [address] [place], [place] [address]  D-dimer, quantitative (not at [facility])     Status: Abnormal   Collection Time: 01/06/19  4:26 AM  Result Value Ref Range   D-Dimer, Quant 5.87 (H) 0.00 - 0.50 ug/mL-FEU    Comment: (NOTE) At the manufacturer cut-off of 0.50 ug/mL FEU, this assay has been documented to exclude PE with a sensitivity and negative  predictive value of 97 to 99%.  At this time, this assay has not been approved by the FDA to exclude DVT/VTE. Results should be correlated with clinical presentation. Performed at [facility], [place] [address], [place], [place] [address]   Ferritin     Status: Abnormal   Collection Time: 01/06/19  4:26 AM  Result Value Ref Range   Ferritin 1,107 (H) 24 - 336 ng/mL    Comment: Performed at [facility] [address], [place], [place] [address]  Magnesium     Status: None   Collection Time: 01/06/19  4:26 AM  Result Value Ref Range   Magnesium 2.2 1.7 - 2.4 mg/dL    Comment: Performed at [facility] [address] [place],  [address]  CBG monitoring, ED     Status: Abnormal   Collection Time: 01/06/19  8:08 AM  Result Value Ref Range   Glucose-Capillary 209 (H) 70 - 99 mg/dL   Comment 1 Notify RN    Comment 2 Document in Chart   CBG monitoring, ED     Status: Abnormal   Collection Time: 01/06/19 12:13 PM  Result Value Ref  Range   Glucose-Capillary 238 (H) 70 - 99 mg/dL   Comment 1 Notify RN    Comment 2 Document in Chart    No results found.  Pending Labs Unresulted Labs (From admission, onward)    Start     Ordered   01/06/19 0500  CBC with Differential/Platelet  Daily,   R     01/05/19 1246   01/06/19 0500  Comprehensive metabolic panel  Daily,   R     01/05/19 1246   01/06/19 0500  C-reactive protein  Daily,   R     01/05/19 1246   01/06/19 0500  D-dimer, quantitative (not at [facility])  Daily,   R     01/05/19 1246   01/06/19 0500  Ferritin  Daily,   R     01/05/19 1246   01/06/19 0500  Magnesium  Daily,   R     01/05/19 1246   01/04/19 0011  Blood Culture (routine x 2)  BLOOD CULTURE X 2,   STAT     01/04/19 0011          Vitals/Pain Today's Vitals   01/06/19 1350 01/06/19 1400 01/06/19 1445 01/06/19 1530  BP:  (!) 167/73 (!) 170/78   Pulse:  65 69   Resp:  12 15   Temp:      TempSrc:      SpO2:  98% 97%   PainSc: 0-No pain    0-No pain    Isolation Precautions Airborne and Contact precautions  Medications Medications  aspirin EC tablet 81 mg (81 mg Oral Given 01/06/19 0954)  HYDROcodone-acetaminophen (NORCO) 10-325 MG per tablet 1-2 tablet (1 tablet Oral Refused 01/06/19 0957)  atorvastatin (LIPITOR) tablet 40 mg (40 mg Oral Given 01/06/19 0954)  labetalol (NORMODYNE) tablet 100 mg (100 mg Oral Given 01/06/19 0954)  calcitRIOL (ROCALTROL) capsule 0.25 mcg (has no administration in time range)  methimazole (TAPAZOLE) tablet 2.5 mg (2.5 mg Oral Given 01/06/19 1139)  linagliptin (TRADJENTA) tablet 5 mg (5 mg Oral Given 01/06/19 1138)  tamsulosin (FLOMAX) capsule 0.4 mg (0.4 mg Oral Given 01/06/19 0033)  vitamin B-12 (CYANOCOBALAMIN) tablet 1,000 mcg (1,000 mcg Oral Given 01/06/19 0954)  mycophenolate (MYFORTIC) EC tablet 180 mg (180 mg Oral Given 01/06/19 1139)  tacrolimus (PROGRAF) capsule 2 mg (2 mg Oral Given 01/06/19 1138)  gabapentin (NEURONTIN) capsule 300 mg (300 mg Oral Given 01/06/19 0954)  multivitamin with minerals tablet 1 tablet (1 tablet Oral Given 01/06/19 0953)  acetaminophen (TYLENOL) tablet 650 mg (has no administration in time range)    Or  acetaminophen (TYLENOL) suppository 650 mg (has no administration in time range)  ondansetron (ZOFRAN) tablet 4 mg (has no administration in time range)    Or  ondansetron (ZOFRAN) injection 4 mg (has no administration in time range)  insulin aspart (novoLOG) injection 0-9 Units (3 Units Subcutaneous Given 01/06/19 1327)  heparin injection 5,000 Units (5,000 Units Subcutaneous Given 01/06/19 1329)  remdesivir 200 mg in sodium chloride 0.9% 250 mL IVPB (0 mg Intravenous Stopped 01/04/19 0758)    Followed by  remdesivir 100 mg in sodium chloride 0.9 % 100 mL IVPB (0 mg Intravenous Stopped 01/06/19 1023)  dexamethasone (DECADRON) injection 6 mg (6 mg Intravenous Given 01/06/19 0840)  insulin glargine (LANTUS) injection 24 Units (24 Units Subcutaneous Given  01/06/19 0306)  sodium zirconium cyclosilicate (LOKELMA) packet 10 g (10 g Oral Not Given 01/06/19 0356)  sodium zirconium cyclosilicate (LOKELMA) packet 10 g (10 g Oral Given 01/05/19 1625)  hydrALAZINE (APRESOLINE) injection 10 mg (has no administration in time range)  furosemide (LASIX) injection 40 mg (40 mg Intravenous Given 01/06/19 1330)  sodium bicarbonate tablet 650 mg (650 mg Oral Given 01/06/19 1329)  sodium chloride 0.9 % bolus 1,000 mL (0 mLs Intravenous Stopped 01/04/19 0310)    Mobility walks Low fall risk   Focused Assessments Pulmonary Assessment Handoff:  Lung sounds: Bilateral Breath Sounds: Clear, Diminished L Breath Sounds: Clear, Diminished R Breath Sounds: Clear, Diminished O2 Device: Nasal Cannula O2 Flow Rate (L/min): 3 L/min      R Recommendations: See Admitting Provider Note  Report given to:   Additional Notes: N/A

## 2019-01-06 NOTE — Progress Notes (Signed)
Admit: 01/03/2019 LOS: 2  63M CKD3T (BL SCr low 2s) recent [place] admission returned with SOB  Subjective:  [name] SCr cont to trend down, at baseline  . K improved to 5.4 . Lasix 40 BID IV, Dexamethasone, on Tac/MMF . 3L [place]; BPs improving  12/26 0701 - 12/27 0700 In: 100 [IV Piggyback:100] Out: -   There were no vitals filed for this visit.  Scheduled Meds: . aspirin EC  81 mg Oral Daily  . atorvastatin  40 mg Oral Daily  . calcitRIOL  0.25 mcg Oral See admin instructions  . dexamethasone (DECADRON) injection  6 mg Intravenous Q24H  . furosemide  40 mg Intravenous Q12H  . gabapentin  300 mg Oral BID  . heparin  5,000 Units Subcutaneous Q8H  . HYDROcodone-acetaminophen  1-2 tablet Oral Q6H  . insulin aspart  0-9 Units Subcutaneous TID WC  . insulin glargine  24 Units Subcutaneous QHS  . labetalol  100 mg Oral BID  . linagliptin  5 mg Oral Daily  . methimazole  2.5 mg Oral Daily  . multivitamin with minerals  1 tablet Oral Daily  . mycophenolate  180 mg Oral BID  . sodium bicarbonate  650 mg Oral BID  . tacrolimus  2 mg Oral BID  . tamsulosin  0.4 mg Oral QHS  . vitamin B-12  1,000 mcg Oral BID   Continuous Infusions: . remdesivir 100 mg in NS 100 mL Stopped (01/06/19 1023)   PRN Meds:.acetaminophen **OR** acetaminophen, hydrALAZINE, ondansetron **OR** ondansetron (ZOFRAN) IV  Current Labs: reviewed    Physical Exam:  Blood pressure (!) 170/78, pulse 69, temperature 97.7 F (36.5 C), temperature source Oral, resp. rate 15, SpO2 97 %. Due to the nature of this patient's TKTCC-88 with isolation and in keeping with efforts to prevent the spread of infection and to conserve personal protective equipment, a physical exam was not personally performed. A chart review of other providers notes and the patient's lab work as well as review of other pertinent studies was performed  A 1. CKD3T, sp KT 2015 on Tac/MMF, followed by [place] at [facility] 2. AKI, now back to baseline  SCr 3. COVID 19, per [facility], on decadron and remdesiver 4. HTN/Vol: BPs improved, on lasix 5. Hyperkalemia: K 5.4 today, s/p Lokelma 6. NAG Met acidosis on NaHCo3 , follow, CTM  P . As above, will cont to follow, Cont Tac/MMF . COnt lasix . Daily weights, Daily Renal Panel, Strict I/Os, Avoid nephrotoxins (NSAIDs, judicious IV Contrast)    Pearson Grippe MD 01/06/2019, 4:25 PM  Recent Labs  Lab 01/04/19 0011 01/04/19 0800 01/06/19 0426  NA 141 146* 144  K 5.7* 5.9* 5.4*  CL 118* 120* 119*  CO2 16* 15* 15*  GLUCOSE 166* 167* 255*  BUN 76* 70* 71*  CREATININE 2.95* 2.66* 2.35*  CALCIUM 8.7* 8.6* 8.9   Recent Labs  Lab 01/04/19 0011 01/04/19 0800 01/06/19 0426  WBC 6.1 5.5 7.2  NEUTROABS 5.1 5.0 6.5  HGB 11.8* 11.2* 11.9*  HCT 36.9* 35.2* 37.1*  MCV 93.4 94.6 92.3  PLT 198 181 202

## 2019-01-06 NOTE — Progress Notes (Signed)
TAARIQ LEITZ is a 68 y.o. male patient admitted from ED awake, alert - oriented  X 4 - no acute distress noted.  VSS - Blood pressure (!) 170/78, pulse 75, temperature 97.8 F (36.6 C), temperature source Oral, resp. rate 19, SpO2 95 %.    IV in place, occlusive dsg intact without redness.  Orientation to room, and floor completed with information packet given to patient.  Patient declined safety video at this time.  Admission INP armband ID verified with patient, and in place.   SR up x 2, fall assessment complete, with patient able to verbalize understanding of risk associated with falls, and verbalized understanding to call nsg before up out of bed.  Call light within reach, patient able to voice, and demonstrate understanding. Small area of MASD on bottom, scabs on bilateral arms, elbows, and lower legs. Old surgical scars on RLQ, R upper leg and mid-lower back.    Dene Gentry, RN 01/06/2019 7:03 PM

## 2019-01-07 ENCOUNTER — Inpatient Hospital Stay (HOSPITAL_COMMUNITY): Payer: Medicare Other

## 2019-01-07 LAB — D-DIMER, QUANTITATIVE: D-Dimer, Quant: 3.51 ug/mL-FEU — ABNORMAL HIGH (ref 0.00–0.50)

## 2019-01-07 LAB — COMPREHENSIVE METABOLIC PANEL
ALT: 62 U/L — ABNORMAL HIGH (ref 0–44)
AST: 21 U/L (ref 15–41)
Albumin: 2.5 g/dL — ABNORMAL LOW (ref 3.5–5.0)
Alkaline Phosphatase: 65 U/L (ref 38–126)
Anion gap: 15 (ref 5–15)
BUN: 83 mg/dL — ABNORMAL HIGH (ref 8–23)
CO2: 17 mmol/L — ABNORMAL LOW (ref 22–32)
Calcium: 8.8 mg/dL — ABNORMAL LOW (ref 8.9–10.3)
Chloride: 113 mmol/L — ABNORMAL HIGH (ref 98–111)
Creatinine, Ser: 2.68 mg/dL — ABNORMAL HIGH (ref 0.61–1.24)
GFR calc Af Amer: 27 mL/min — ABNORMAL LOW (ref >60)
GFR calc non Af Amer: 24 mL/min — ABNORMAL LOW (ref >60)
Glucose, Bld: 197 mg/dL — ABNORMAL HIGH (ref 70–99)
Potassium: 5 mmol/L (ref 3.5–5.1)
Sodium: 145 mmol/L (ref 135–145)
Total Bilirubin: 0.3 mg/dL (ref 0.3–1.2)
Total Protein: 6.1 g/dL — ABNORMAL LOW (ref 6.5–8.1)

## 2019-01-07 LAB — CBC WITH DIFFERENTIAL/PLATELET
Abs Immature Granulocytes: 0.05 10*3/uL (ref 0.00–0.07)
Basophils Absolute: 0 10*3/uL (ref 0.0–0.1)
Basophils Relative: 0 %
Eosinophils Absolute: 0 10*3/uL (ref 0.0–0.5)
Eosinophils Relative: 0 %
HCT: 38.1 % — ABNORMAL LOW (ref 39.0–52.0)
Hemoglobin: 12.2 g/dL — ABNORMAL LOW (ref 13.0–17.0)
Immature Granulocytes: 1 %
Lymphocytes Relative: 4 %
Lymphs Abs: 0.3 10*3/uL — ABNORMAL LOW (ref 0.7–4.0)
MCH: 29.1 pg (ref 26.0–34.0)
MCHC: 32 g/dL (ref 30.0–36.0)
MCV: 90.9 fL (ref 80.0–100.0)
Monocytes Absolute: 0.5 10*3/uL (ref 0.1–1.0)
Monocytes Relative: 6 %
Neutro Abs: 7.2 10*3/uL (ref 1.7–7.7)
Neutrophils Relative %: 89 %
Platelets: 202 10*3/uL (ref 150–400)
RBC: 4.19 MIL/uL — ABNORMAL LOW (ref 4.22–5.81)
RDW: 14 % (ref 11.5–15.5)
WBC: 8 10*3/uL (ref 4.0–10.5)
nRBC: 0 % (ref 0.0–0.2)

## 2019-01-07 LAB — C-REACTIVE PROTEIN: CRP: 5 mg/dL — ABNORMAL HIGH (ref <1.0)

## 2019-01-07 LAB — MAGNESIUM: Magnesium: 2.2 mg/dL (ref 1.7–2.4)

## 2019-01-07 LAB — FERRITIN: Ferritin: 728 ng/mL — ABNORMAL HIGH (ref 24–336)

## 2019-01-07 LAB — GLUCOSE, CAPILLARY
Glucose-Capillary: 284 mg/dL — ABNORMAL HIGH (ref 70–99)
Glucose-Capillary: 176 mg/dL — ABNORMAL HIGH (ref 70–99)
Glucose-Capillary: 229 mg/dL — ABNORMAL HIGH (ref 70–99)
Glucose-Capillary: 277 mg/dL — ABNORMAL HIGH (ref 70–99)
Glucose-Capillary: 311 mg/dL — ABNORMAL HIGH (ref 70–99)

## 2019-01-07 MED ORDER — INSULIN ASPART 100 UNIT/ML ~~LOC~~ SOLN
4.0000 [IU] | Freq: Three times a day (TID) | SUBCUTANEOUS | Status: DC
  Administered 2019-01-08 – 2019-01-11 (×11): 4 [IU] via SUBCUTANEOUS

## 2019-01-07 NOTE — Progress Notes (Addendum)
PROGRESS NOTE    JACOBIE STAMEY  HFW:[id number] DOB: 07-Aug-1950 DOA: 01/03/2019 PCP: [name], MD   Brief Narrative:  Jon [name] a 68 y.o.malewithhistory of renal transplant stage IV kidney disease, diabetes mellitus type 2, hypertension and history of stroke who was  discharged 3 days prior to the being admitted for Covid 19 infection with diarrhea was empirically treated with IV fluids at that time patient's Lasix and Bactrim were withheld and discharged home after patient's creatinine improved and diarrhea stopped. Patient after reaching home became more short of breath and since it was persistent patient came back to the ER but denies any chest pain or fever chills. No productive cough.  ED Course:Chest x-ray in the ER shows interstitial changes which could be either from Covid infection or fluid overload. Labs show creatinine of 2.9 which is increased from 2 about 4 days ago potassium 5.7 blood glucose 166 AST and ALT also has increased. LDH 328 ferritin 1247 and CRP 16.5 procalcitonin 0.18 BNP 59. Hemoglobin slightly decreased from baseline is around 11.8 WBC count 6.1 EKG shows normal sinus rhythm. Given the acute respiratory failure with hypoxia, chest x-ray showed infiltrates with normal BNP likely by Covid pneumonia.  Patient was started on IV Decadron and remdesivir and admitted for further management.   Assessment & Plan:   Principal Problem:   Acute respiratory failure with hypoxia (HCC) Active Problems:   Pulmonary hypertension, unspecified (HCC)   Type 2 diabetes mellitus without complications ([place])   AKI (acute kidney injury) ([place])   COVID-19 virus infection   Acute kidney injury superimposed on chronic kidney disease (HCC)   Elevated LFTs   Pneumonia due to COVID-19 virus   Cerebrovascular accident (CVA) ([place])  1 acute respiratory failure with hypoxia secondary to COVID-19 infection and concern for possible volume overload Patient noted to  have presented with worsening shortness of breath, noted to be hypoxic, chest x-ray with bilateral interstitial markings.  Patient recently discharged 3 days prior to admission after being admitted for COVID-19 infection which presented as diarrhea at which time patient had a normal BNP.  Patient currently afebrile.  Due to patient's underlying renal transplant patient at high risk for worsening Covid infection.  Patient states slowly improving with his shortness of breath.  Patient on 3 L nasal cannula with sats ranging from 91 to 93%.  Inflammatory markers trending down.  Continue IV remdesivir, IV Decadron.  IV Lasix has been discontinued per nephrology.  Supportive care.  2.  Transaminitis Could likely be secondary to Covid infection.  Abdominal ultrasound with a coarse hepatic echotexture.  Patient currently on methimazole and statins.  LFTs trending down.  Follow.  3.  Diabetes mellitus type 2 with hyperglycemia Hemoglobin A1c 7.8 on 12/28/2018.  CBG of 176 this morning.  Elevated CBGs likely exacerbated with steroids.  Continue current dose of Lantus, sliding scale insulin.  Will add meal coverage NovoLog 4 units 3 times daily with meals.  Continue current decreased dose of gabapentin.  Follow.  4.  Hypertension Blood pressure improved with addition of hydralazine 25 mg 3 times daily.  Continue labetalol.  Follow.   5.  Acute kidney injury on chronic kidney disease stage III status post kidney transplant 2015 Renal function improving and close to baseline.  Nephrology consulted and are following.  Continue tacrolimus, mycophenolate, steroids.  Appreciate nephrology input and recommendations.  6.  Non-anion gap metabolic acidosis Likely secondary to chronic kidney disease.  Slowly improving on oral bicarb  tablets.  Per nephrology.   7.  Hyperkalemia Improved.  Potassium currently at 5.0.  Status post Lokelma.  On Lasix.  Nephrology following.  8.  History of benign follicular nodule status  post thyroid surgery 10/13/2015 /?hyperthyroidism Continue home regimen methimazole.  Follow.  9.  History of cryptogenic CVA Stable.  Continue aspirin for secondary stroke prophylaxis.  Continue statin.  PT/OT.    DVT prophylaxis: Heparin Code Status: Full Family Communication: Updated patient.  Updated wife on telephone.  Disposition Plan: Likely home when clinically improved.   Consultants:   Nephrology: Dr. Moshe Cipro 01/05/2019  Procedures:   Chest x-ray 01/04/2019    Antimicrobials:   IV remdesivir 01/04/2019>>>>>   Subjective: Patient sleeping however easily arousable.  Feels shortness of breath improving daily but not at baseline.  Denies any chest pain.  No further diarrhea.  Still with significant fatigue and generalized weakness.   Objective: Vitals:   01/06/19 2029 01/07/19 0400 01/07/19 0500 01/07/19 0526  BP: (!) 150/75   137/68  Pulse: 85     Resp: 16     Temp: 97.6 F (36.4 C) (!) 97.4 F (36.3 C)    TempSrc: Oral Oral    SpO2: 93%     Weight:   100.2 kg     Intake/Output Summary (Last 24 hours) at 01/07/2019 1128 Last data filed at 01/07/2019 0300 Gross per 24 hour  Intake --  Output 700 ml  Net -700 ml   Filed Weights   01/07/19 0500  Weight: 100.2 kg    Examination:  General exam: NAD.  Respiratory system: Scattered coarse breath sounds.  No wheezing noted.  Speaking in full sentences.  Normal respiratory effort.  Cardiovascular system: Regular rate rhythm no murmurs rubs or gallops.  No JVD.  No lower extremity edema. Gastrointestinal system: Abdomen is soft, nontender, nondistended, positive bowel sounds.  No rebound.  No guarding.  Central nervous system: Alert and oriented. No focal neurological deficits. Extremities: Symmetric 5 x 5 power. Skin: No rashes, lesions or ulcers Psychiatry: Judgement and insight appear normal. Mood & affect appropriate.     Data Reviewed: I have personally reviewed following labs and imaging  studies  CBC: Recent Labs  Lab 01/04/19 0011 01/04/19 0800 01/06/19 0426 01/07/19 0500  WBC 6.1 5.5 7.2 8.0  NEUTROABS 5.1 5.0 6.5 7.2  HGB 11.8* 11.2* 11.9* 12.2*  HCT 36.9* 35.2* 37.1* 38.1*  MCV 93.4 94.6 92.3 90.9  PLT 198 181 202 433   Basic Metabolic Panel: Recent Labs  Lab 01/04/19 0011 01/04/19 0800 01/06/19 0426 01/07/19 0500  NA 141 146* 144 145  K 5.7* 5.9* 5.4* 5.0  CL 118* 120* 119* 113*  CO2 16* 15* 15* 17*  GLUCOSE 166* 167* 255* 197*  BUN 76* 70* 71* 83*  CREATININE 2.95* 2.66* 2.35* 2.68*  CALCIUM 8.7* 8.6* 8.9 8.8*  MG  --   --  2.2 2.2   GFR: Estimated Creatinine Clearance: 32.3 mL/min (A) (by C-G formula based on SCr of 2.68 mg/dL (H)). Liver Function Tests: Recent Labs  Lab 01/04/19 0011 01/04/19 0800 01/06/19 0426 01/07/19 0500  AST 85* 55* 25 21  ALT 106* 91* 74* 62*  ALKPHOS 64 59 64 65  BILITOT 0.7 0.2* 0.1* 0.3  PROT 6.1* 5.7* 6.2* 6.1*  ALBUMIN 2.5* 2.3* 2.5* 2.5*   No results for input(s): LIPASE, AMYLASE in the last 168 hours. No results for input(s): AMMONIA in the last 168 hours. Coagulation Profile: No results for input(s): INR, PROTIME  in the last 168 hours. Cardiac Enzymes: No results for input(s): CKTOTAL, CKMB, CKMBINDEX, TROPONINI in the last 168 hours. BNP (last 3 results) No results for input(s): PROBNP in the last 8760 hours. HbA1C: No results for input(s): HGBA1C in the last 72 hours. CBG: Recent Labs  Lab 01/06/19 0808 01/06/19 1213 01/06/19 1843 01/06/19 2234 01/07/19 0802  GLUCAP 209* 238* 258* 274* 176*   Lipid Profile: No results for input(s): CHOL, HDL, LDLCALC, TRIG, CHOLHDL, LDLDIRECT in the last 72 hours. Thyroid Function Tests: No results for input(s): TSH, T4TOTAL, FREET4, T3FREE, THYROIDAB in the last 72 hours. Anemia Panel: Recent Labs    01/06/19 0426 01/07/19 0500  FERRITIN 1,107* 728*   Sepsis Labs: Recent Labs  Lab 01/04/19 0011  PROCALCITON 0.18  LATICACIDVEN 1.1     Recent Results (from the past 240 hour(s))  GI pathogen panel by PCR, stool     Status: None   Collection Time: 12/28/18  9:57 PM   Specimen: STOOL  Result Value Ref Range Status   Plesiomonas shigelloides NOT DETECTED NOT DETECTED Final   Yersinia enterocolitica NOT DETECTED NOT DETECTED Final   Vibrio NOT DETECTED NOT DETECTED Final   Enteropathogenic E coli NOT DETECTED NOT DETECTED Final   E coli (ETEC) LT/ST NOT DETECTED NOT DETECTED Final   E coli 3474 by PCR Not applicable NOT DETECTED Final   Cryptosporidium by PCR NOT DETECTED NOT DETECTED Final   Entamoeba histolytica NOT DETECTED NOT DETECTED Final   Adenovirus F 40/41 NOT DETECTED NOT DETECTED Final   Norovirus GI/GII NOT DETECTED NOT DETECTED Final   Sapovirus NOT DETECTED NOT DETECTED Final    Comment: (NOTE) Performed At: [facility] [place], [place] [id number] Rush Farmer MD IE:[id number]    Vibrio cholerae NOT DETECTED NOT DETECTED Final   Campylobacter by PCR NOT DETECTED NOT DETECTED Final   Salmonella by PCR NOT DETECTED NOT DETECTED Final   E coli (STEC) NOT DETECTED NOT DETECTED Final   Enteroaggregative E coli NOT DETECTED NOT DETECTED Final   Shigella by PCR NOT DETECTED NOT DETECTED Final   Cyclospora cayetanensis NOT DETECTED NOT DETECTED Final   Astrovirus NOT DETECTED NOT DETECTED Final   G lamblia by PCR NOT DETECTED NOT DETECTED Final   Rotavirus A by PCR NOT DETECTED NOT DETECTED Final  Blood Culture (routine x 2)     Status: None (Preliminary result)   Collection Time: 01/03/19 11:55 PM   Specimen: BLOOD  Result Value Ref Range Status   Specimen Description BLOOD RIGHT UPPER ARM  Final   Special Requests   Final    BOTTLES DRAWN AEROBIC AND ANAEROBIC Blood Culture results may not be optimal due to an inadequate volume of blood received in culture bottles   Culture   Final    NO GROWTH 2 DAYS Performed at [facility], [address], [place], [place]  [address]    Report Status PENDING  Incomplete         Radiology Studies: No results found.      Scheduled Meds: . aspirin EC  81 mg Oral Daily  . atorvastatin  40 mg Oral Daily  . calcitRIOL  0.25 mcg Oral See admin instructions  . dexamethasone (DECADRON) injection  6 mg Intravenous Q24H  . furosemide  40 mg Intravenous Q12H  . gabapentin  300 mg Oral BID  . heparin  5,000 Units Subcutaneous Q8H  . hydrALAZINE  25 mg Oral Q8H  . HYDROcodone-acetaminophen  1-2 tablet Oral Q6H  . insulin aspart  0-9 Units Subcutaneous TID WC  . insulin glargine  24 Units Subcutaneous QHS  . labetalol  100 mg Oral BID  . linagliptin  5 mg Oral Daily  . methimazole  2.5 mg Oral Daily  . multivitamin with minerals  1 tablet Oral Daily  . mycophenolate  180 mg Oral BID  . sodium bicarbonate  650 mg Oral BID  . tacrolimus  2 mg Oral BID  . tamsulosin  0.4 mg Oral QHS  . vitamin B-12  1,000 mcg Oral BID   Continuous Infusions: . remdesivir 100 mg in NS 100 mL 100 mg (01/07/19 1050)     LOS: 3 days    Time spent: 40 minutes    Irine Seal, MD [facility]  If 7PM-7AM, please contact night-coverage [email or web address] 01/07/2019, 11:28 AM

## 2019-01-07 NOTE — Evaluation (Signed)
Physical Therapy Evaluation Patient Details Name: Jon [name] MRN: [id number] DOB: 08-18-1950 Today's Date: 01/07/2019   History of Present Illness  68 y.o. male with history of renal transplant stage IV kidney disease now, diabetes mellitus type 2, hypertension and history of stroke who was just discharged 12/22 after being admitted for Covid 19 infection with diarrhea. Returned to ED 12/25 with SoB where Chest x-ray in the ER shows interstitial changes which could be either from Covid infection or fluid overload admitted 12/25 for treatment of acute respiratory failure with hypoxia secondary to COVID.  Clinical Impression  Pt with some confusion today initially with time and situation. Home set up and PLOF a combination prior notes and pt report PTA pt living with wife in multilevel home with ramped entrance. Pt reports that he was independent prior to his COVID diagnosis but since then he has had decreased strength in his LE and was requiring using his RW and assist with ADLs from his wife. Pt currently is minA for bed mobility, transfers and ambulation of 10 feet with RW. PT recommending HHPT level rehab at discharge to improve strength and mobility. PT will continue to follow acutely.    Follow Up Recommendations Home health PT    Equipment Recommendations  None recommended by PT       Precautions / Restrictions Precautions Precautions: None Restrictions Weight Bearing Restrictions: No      Mobility  Bed Mobility Overal bed mobility: Needs Assistance Bed Mobility: Supine to Sit     Supine to sit: Min assist     General bed mobility comments: minA for pulling against therapist to come to EoB  Transfers Overall transfer level: Needs assistance Equipment used: Rolling walker (2 wheeled) Transfers: Sit to/from Stand Sit to Stand: Min assist;Min guard         General transfer comment: min guard for safety and increased time and effort to power up and steady in RW from  elevated bed surface, minA for power up from standard chair without armrests  Ambulation/Gait Ambulation/Gait assistance: Min assist Gait Distance (Feet): 16 Feet Assistive device: Rolling walker (2 wheeled) Gait Pattern/deviations: Step-through pattern;Decreased dorsiflexion - left;Decreased weight shift to left;Trunk flexed Gait velocity: slowed Gait velocity interpretation: <1.8 ft/sec, indicate of risk for recurrent falls General Gait Details: minA for steadying with gait of 10 feet to sink where he sat for grooming and then for 6 feet back to recliner. Pt with decreased L foot dorsiflexion and increased hip hike to facilitate swing through of L LE      Balance Overall balance assessment: Needs assistance Sitting-balance support: Feet supported;No upper extremity supported Sitting balance-Leahy Scale: Fair     Standing balance support: Bilateral upper extremity supported Standing balance-Leahy Scale: Poor Standing balance comment: requires UE assist for balance                             Pertinent Vitals/Pain Pain Assessment: Faces Faces Pain Scale: Hurts a little bit Pain Location: generalized with intial movement, improved with mobility  Pain Descriptors / Indicators: Grimacing;Guarding Pain Intervention(s): Limited activity within patient's tolerance;Monitored during session;Repositioned    Home Living Family/patient expects to be discharged to:: Private residence Living Arrangements: Spouse/significant other Available Help at Discharge: Family;Available 24 hours/day Type of Home: House Home Access: Ramped entrance(goes into basement)     Home Layout: Two level;Able to live on main level with bedroom/bathroom Home Equipment: Shower seat - built in;Grab bars -  tub/shower;Hand held shower head;Walker - 2 wheels      Prior Function Level of Independence: Needs assistance   Gait / Transfers Assistance Needed: uses RW, starting to have difficulty with  stairs  ADL's / Homemaking Assistance Needed: starting to decrease recently while ill, decreased endurance for BADL  Comments: pt slightly confused        Extremity/Trunk Assessment   Upper Extremity Assessment Upper Extremity Assessment: Defer to OT evaluation    Lower Extremity Assessment Lower Extremity Assessment: RLE deficits/detail;LLE deficits/detail RLE Deficits / Details: knee lacks full extension, hip and ankle WFL, strength grossly assessed 3+/5 RLE Coordination: decreased fine motor LLE Deficits / Details: L foot drop, knees lack full extension, strength of hip and knee grossly 3+/5 LLE Coordination: decreased fine motor       Communication   Communication: No difficulties  Cognition Arousal/Alertness: Awake/alert Behavior During Therapy: WFL for tasks assessed/performed Overall Cognitive Status: Impaired/Different from baseline Area of Impairment: Orientation;Memory                 Orientation Level: Disoriented to;Time;Situation(able to acknowledge he has COVID, but unclear on situation)   Memory: Decreased short-term memory                General Comments General comments (skin integrity, edema, etc.): SaO2 on RA >90%O2 throughout session        Assessment/Plan    PT Assessment Patient needs continued PT services  PT Problem List Decreased strength;Decreased range of motion;Decreased activity tolerance;Decreased balance;Decreased mobility;Decreased cognition;Decreased safety awareness       PT Treatment Interventions DME instruction;Gait training;Functional mobility training;Therapeutic activities;Therapeutic exercise;Balance training;Cognitive remediation    PT Goals (Current goals can be found in the Care Plan section)  Acute Rehab PT Goals Patient Stated Goal: go home PT Goal Formulation: With patient Time For Goal Achievement: 01/21/19 Potential to Achieve Goals: Fair    Frequency Min 3X/week   Barriers to discharge         Co-evaluation PT/OT/SLP Co-Evaluation/Treatment: Yes Reason for Co-Treatment: For patient/therapist safety           AM-PAC PT "6 Clicks" Mobility  Outcome Measure Help needed turning from your back to your side while in a flat bed without using bedrails?: None Help needed moving from lying on your back to sitting on the side of a flat bed without using bedrails?: A Lot Help needed moving to and from a bed to a chair (including a wheelchair)?: A Little Help needed standing up from a chair using your arms (e.g., wheelchair or bedside chair)?: A Little Help needed to walk in hospital room?: A Little Help needed climbing 3-5 steps with a railing? : A Lot 6 Click Score: 17    End of Session Equipment Utilized During Treatment: Gait belt Activity Tolerance: Patient limited by fatigue Patient left: in chair;with call bell/phone within reach;with chair alarm set Nurse Communication: Mobility status PT Visit Diagnosis: Unsteadiness on feet (R26.81);Other abnormalities of gait and mobility (R26.89);Muscle weakness (generalized) (M62.81);Difficulty in walking, not elsewhere classified (R26.2)    Time: 7322-0254 PT Time Calculation (min) (ACUTE ONLY): 33 min   Charges:   PT Evaluation $PT Eval Moderate Complexity: 1 Mod          Jon Bienvenue B. Migdalia Dk PT, DPT Acute Rehabilitation Services Pager [phone] Office [phone]   [place] 01/07/2019, 1:36 PM

## 2019-01-07 NOTE — Evaluation (Signed)
Occupational Therapy Evaluation Patient Details Name: Jon [name] MRN: [id number] DOB: 08-31-1950 Today's Date: 01/07/2019    History of Present Illness 68 y.o. male with history of renal transplant stage IV kidney disease now, diabetes mellitus type 2, hypertension and history of stroke who was just discharged 12/22 after being admitted for Covid 19 infection with diarrhea. Returned to ED 12/25 with SoB where Chest x-ray in the ER shows interstitial changes which could be either from Covid infection or fluid overload admitted 12/25 for treatment of acute respiratory failure with hypoxia secondary to COVID.   Clinical Impression   PTA pt living with spouse, reports decreased level of independence with BADL since COVID diagnoses. At time of eval, pt is somewhat confused- unsure of accuracy of all pt reports at this time. He is currently min A for bed mobility and transfers with RW. Pt able to complete functional mobility to sink with RW at min A. Once at sink, needing to complete task while seated due to decreased activity tolerance. Pt spO2 remained stable on RA. Cues needed for problem solving, specifically initiation to tasks. Given current status, recommending [place] with 24/7 supervision. Will continue to follow acutely per POC listed below.     Follow Up Recommendations  Home health OT;Supervision/Assistance - 24 hour    Equipment Recommendations  3 in 1 bedside commode    Recommendations for Other Services       Precautions / Restrictions Precautions Precautions: Fall Restrictions Weight Bearing Restrictions: No      Mobility Bed Mobility Overal bed mobility: Needs Assistance Bed Mobility: Supine to Sit     Supine to sit: Min assist     General bed mobility comments: minA for pulling against therapist to come to EOB  Transfers Overall transfer level: Needs assistance Equipment used: Rolling walker (2 wheeled) Transfers: Sit to/from Stand Sit to Stand: Min  assist;Min guard         General transfer comment: min guard for safety and increased time and effort to power up and steady in RW from elevated bed surface, minA for power up from standard chair without armrests    Balance Overall balance assessment: Needs assistance Sitting-balance support: Feet supported;No upper extremity supported Sitting balance-Leahy Scale: Fair     Standing balance support: Bilateral upper extremity supported Standing balance-Leahy Scale: Poor Standing balance comment: requires UE assist for balance on RW and therapist min A                           ADL either performed or assessed with clinical judgement   ADL Overall ADL's : Needs assistance/impaired Eating/Feeding: Set up;Sitting   Grooming: Set up;Cueing for sequencing;Sitting Grooming Details (indicate cue type and reason): pt compelted functional mobility to sink at min A level with RW, needing to complete grooming tasks in seated position. Mod initiation cues needed to wash face Upper Body Bathing: Minimal assistance;Sitting Upper Body Bathing Details (indicate cue type and reason): to reach back; able to wash chest and arms Lower Body Bathing: Moderate assistance;Sit to/from stand;Sitting/lateral leans   Upper Body Dressing : Set up;Sitting   Lower Body Dressing: Moderate assistance;Sit to/from stand;Sitting/lateral leans   Toilet Transfer: Minimal assistance;RW;Ambulation;Comfort height toilet;Grab bars Toilet Transfer Details (indicate cue type and reason): requires grab bars or comfort height for increased success and safety with transfer Toileting- Clothing Manipulation and Hygiene: Minimal assistance;Sitting/lateral lean   Tub/ Shower Transfer: Minimal assistance;Ambulation;Shower seat;Rolling walker   Functional mobility during  ADLs: Minimal assistance;Cueing for safety;Cueing for sequencing;Rolling walker General ADL Comments: pt presenting with decreased activity tolerance  for BADL as well as increased need for problem solving cues     Vision Patient Visual Report: No change from baseline       Perception     Praxis      Pertinent Vitals/Pain Pain Assessment: Faces Faces Pain Scale: Hurts a little bit Pain Location: generalized with intial movement, improved with mobility  Pain Descriptors / Indicators: Grimacing;Guarding Pain Intervention(s): Monitored during session     Hand Dominance     Extremity/Trunk Assessment Upper Extremity Assessment Upper Extremity Assessment: Generalized weakness   Lower Extremity Assessment Lower Extremity Assessment: Defer to PT evaluation RLE Deficits / Details: knee lacks full extension, hip and ankle WFL, strength grossly assessed 3+/5 RLE Coordination: decreased fine motor LLE Deficits / Details: L foot drop, knees lack full extension, strength of hip and knee grossly 3+/5 LLE Coordination: decreased fine motor       Communication Communication Communication: No difficulties   Cognition Arousal/Alertness: Awake/alert Behavior During Therapy: WFL for tasks assessed/performed Overall Cognitive Status: Impaired/Different from baseline Area of Impairment: Orientation;Memory;Problem solving                 Orientation Level: Disoriented to;Time;Situation(able to acknowledge he has COVID, but unclear on situation)   Memory: Decreased short-term memory       Problem Solving: Slow processing;Decreased initiation;Requires verbal cues General Comments: pt requiring initiation cues for tasks; increased processing time needed for multistep tasks; noted decreased recall and difficulty orienting self to situation and time   General Comments  SaO2 on RA >90%O2 throughout session    Exercises     Shoulder Instructions      Home Living Family/patient expects to be discharged to:: Private residence Living Arrangements: Spouse/significant other Available Help at Discharge: Family;Available 24  hours/day Type of Home: House Home Access: Ramped entrance(goes into basement)     Home Layout: Two level;Able to live on main level with bedroom/bathroom     Bathroom Shower/Tub: Occupational psychologist: Handicapped height Bathroom Accessibility: Yes How Accessible: Accessible via walker Home Equipment: Shower seat - built in;Grab bars - tub/shower;Hand held Tourist information centre manager - 2 wheels          Prior Functioning/Environment Level of Independence: Needs assistance  Gait / Transfers Assistance Needed: uses RW, starting to have difficulty with stairs ADL's / Homemaking Assistance Needed: starting to decrease recently while ill, decreased endurance for BADL   Comments: pt slightly confused, unsure of all accuracy of info        OT Problem List: Decreased strength;Decreased knowledge of use of DME or AE;Obesity;Decreased activity tolerance;Cardiopulmonary status limiting activity;Decreased cognition;Impaired balance (sitting and/or standing);Decreased safety awareness      OT Treatment/Interventions: Self-care/ADL training;Therapeutic exercise;Patient/family education;Balance training;Energy conservation;Therapeutic activities;DME and/or AE instruction;Cognitive remediation/compensation    OT Goals(Current goals can be found in the care plan section) Acute Rehab OT Goals Patient Stated Goal: go home OT Goal Formulation: With patient Time For Goal Achievement: 01/21/19 Potential to Achieve Goals: Good  OT Frequency: Min 2X/week   Barriers to D/C:            Co-evaluation PT/OT/SLP Co-Evaluation/Treatment: Yes Reason for Co-Treatment: For patient/therapist safety;To address functional/ADL transfers PT goals addressed during session: Mobility/safety with mobility OT goals addressed during session: ADL's and self-care;Strengthening/ROM;Proper use of Adaptive equipment and DME      AM-PAC OT "6 Clicks" Daily Activity  Outcome Measure Help from another person  eating meals?: A Little Help from another person taking care of personal grooming?: A Little Help from another person toileting, which includes using toliet, bedpan, or urinal?: A Little Help from another person bathing (including washing, rinsing, drying)?: A Lot Help from another person to put on and taking off regular upper body clothing?: A Little Help from another person to put on and taking off regular lower body clothing?: A Lot 6 Click Score: 16   End of Session Equipment Utilized During Treatment: Gait belt;Rolling walker Nurse Communication: Mobility status  Activity Tolerance: Patient tolerated treatment well Patient left: in chair;with call bell/phone within reach  OT Visit Diagnosis: Unsteadiness on feet (R26.81);Other abnormalities of gait and mobility (R26.89);Muscle weakness (generalized) (M62.81);Other symptoms and signs involving cognitive function                Time: 6153-7943 OT Time Calculation (min): 33 min Charges:  OT General Charges $OT Visit: 1 Visit  Zenovia Jarred, MSOT, OTR/L [place] OT/ Acute Relief OT [facility] Office: [place] 01/07/2019, 2:51 PM

## 2019-01-07 NOTE — Progress Notes (Addendum)
Admit: 01/03/2019 LOS: 3  27M CKD3T (BL SCr low 2s) recent [place] admission returned with SOB  Subjective:  Jon [name] Pt w/o c/o . SCr 2.68, K 5.0 . Remains on Tac/MMF . Lasix 40 BID IV, Dexamethasone, on Tac/MMF . On RA  12/27 0701 - 12/28 0700 In: -  Out: 700 [Urine:700]  Filed Weights   01/07/19 0500  Weight: 100.2 kg    Scheduled Meds: . aspirin EC  81 mg Oral Daily  . atorvastatin  40 mg Oral Daily  . calcitRIOL  0.25 mcg Oral See admin instructions  . dexamethasone (DECADRON) injection  6 mg Intravenous Q24H  . furosemide  40 mg Intravenous Q12H  . gabapentin  300 mg Oral BID  . heparin  5,000 Units Subcutaneous Q8H  . hydrALAZINE  25 mg Oral Q8H  . HYDROcodone-acetaminophen  1-2 tablet Oral Q6H  . insulin aspart  0-9 Units Subcutaneous TID WC  . insulin glargine  24 Units Subcutaneous QHS  . labetalol  100 mg Oral BID  . linagliptin  5 mg Oral Daily  . methimazole  2.5 mg Oral Daily  . multivitamin with minerals  1 tablet Oral Daily  . mycophenolate  180 mg Oral BID  . sodium bicarbonate  650 mg Oral BID  . tacrolimus  2 mg Oral BID  . tamsulosin  0.4 mg Oral QHS  . vitamin B-12  1,000 mcg Oral BID   Continuous Infusions: . remdesivir 100 mg in NS 100 mL 100 mg (01/07/19 1050)   PRN Meds:.acetaminophen **OR** acetaminophen, hydrALAZINE, ondansetron **OR** ondansetron (ZOFRAN) IV  Current Labs: reviewed    Physical Exam:  Blood pressure 137/68, pulse 85, temperature (!) 97.4 F (36.3 C), temperature source Oral, resp. rate 16, weight 100.2 kg, SpO2 93 %. NAD RRR CTAB NO sig LEE S/nt/nd  A 1. CKD3T, sp KT 2015 on Tac/MMF, followed by [place] at [facility] 2. AKI, now back to baseline SCr 2 to 2.5 3. COVID 19, per [facility], on decadron and remdesiver 4. HTN/Vol: BPs improved, o 5. Hyperkalemia: improved, s/p Lokelma 6. NAG Met acidosis on NaHCo3 , follow, CTM  P . Will hold lasix, seems that vol status stable . Cont Tac/MMF . Will cont to follow . Daily  weights, Daily Renal Panel, Strict I/Os, Avoid nephrotoxins (NSAIDs, judicious IV Contrast)    Pearson Grippe MD 01/07/2019, 2:46 PM  Recent Labs  Lab 01/04/19 0800 01/06/19 0426 01/07/19 0500  NA 146* 144 145  K 5.9* 5.4* 5.0  CL 120* 119* 113*  CO2 15* 15* 17*  GLUCOSE 167* 255* 197*  BUN 70* 71* 83*  CREATININE 2.66* 2.35* 2.68*  CALCIUM 8.6* 8.9 8.8*   Recent Labs  Lab 01/04/19 0800 01/06/19 0426 01/07/19 0500  WBC 5.5 7.2 8.0  NEUTROABS 5.0 6.5 7.2  HGB 11.2* 11.9* 12.2*  HCT 35.2* 37.1* 38.1*  MCV 94.6 92.3 90.9  PLT 181 202 202

## 2019-01-07 NOTE — Progress Notes (Signed)
Inpatient Diabetes Program Recommendations  AACE/ADA: New Consensus Statement on Inpatient Glycemic Control (2015)  Target Ranges:  Prepandial:   less than 140 mg/dL      Peak postprandial:   less than 180 mg/dL (1-2 hours)      Critically ill patients:  140 - 180 mg/dL   Lab Results  Component Value Date   GLUCAP 229 (H) 01/07/2019   HGBA1C 7.8 (H) 12/28/2018    Review of Glycemic Control Results for Jon [name], Jon [name] (MRN [id number]) as of 01/07/2019 14:48  Ref. Range 01/06/2019 12:13 01/06/2019 18:43 01/06/2019 22:34 01/07/2019 08:02 01/07/2019 12:13  Glucose-Capillary Latest Ref Range: 70 - 99 mg/dL 238 (H) 258 (H) 274 (H) 176 (H) 229 (H)   Diabetes history: DM2 Outpatient Diabetes medications: Lantus 24 units + Humalog correction scale (2units per 25 above scale) + Tradjenta qd Current orders for Inpatient glycemic control: Lantus 24 + Tradjenta 5 qd + Novolog correction sensitive tid  Inpatient Diabetes Program Recommendations:   -Add hs Novolog 0-5 units scale -Novolog 4 units tid meal coverage if eats 50%  Thank you, Bethena Roys E. Alexi Dorminey, RN, MSN, CDE  Diabetes Coordinator Inpatient Glycemic Control Team Team Pager [phone] (8am-5pm) 01/07/2019 2:51 PM

## 2019-01-07 NOTE — Plan of Care (Signed)

## 2019-01-08 LAB — CBC WITH DIFFERENTIAL/PLATELET
Abs Immature Granulocytes: 0.04 10*3/uL (ref 0.00–0.07)
Basophils Absolute: 0 10*3/uL (ref 0.0–0.1)
Basophils Relative: 0 %
Eosinophils Absolute: 0 10*3/uL (ref 0.0–0.5)
Eosinophils Relative: 1 %
HCT: 38.5 % — ABNORMAL LOW (ref 39.0–52.0)
Hemoglobin: 12.4 g/dL — ABNORMAL LOW (ref 13.0–17.0)
Immature Granulocytes: 1 %
Lymphocytes Relative: 4 %
Lymphs Abs: 0.3 10*3/uL — ABNORMAL LOW (ref 0.7–4.0)
MCH: 30.2 pg (ref 26.0–34.0)
MCHC: 32.2 g/dL (ref 30.0–36.0)
MCV: 93.7 fL (ref 80.0–100.0)
Monocytes Absolute: 0.5 10*3/uL (ref 0.1–1.0)
Monocytes Relative: 7 %
Neutro Abs: 6.2 10*3/uL (ref 1.7–7.7)
Neutrophils Relative %: 87 %
Platelets: 214 10*3/uL (ref 150–400)
RBC: 4.11 MIL/uL — ABNORMAL LOW (ref 4.22–5.81)
RDW: 13.8 % (ref 11.5–15.5)
WBC: 7 10*3/uL (ref 4.0–10.5)
nRBC: 0 % (ref 0.0–0.2)

## 2019-01-08 LAB — GLUCOSE, CAPILLARY
Glucose-Capillary: 188 mg/dL — ABNORMAL HIGH (ref 70–99)
Glucose-Capillary: 214 mg/dL — ABNORMAL HIGH (ref 70–99)
Glucose-Capillary: 325 mg/dL — ABNORMAL HIGH (ref 70–99)
Glucose-Capillary: 246 mg/dL — ABNORMAL HIGH (ref 70–99)

## 2019-01-08 LAB — RENAL FUNCTION PANEL
Albumin: 2.4 g/dL — ABNORMAL LOW (ref 3.5–5.0)
Anion gap: 15 (ref 5–15)
BUN: 96 mg/dL — ABNORMAL HIGH (ref 8–23)
CO2: 14 mmol/L — ABNORMAL LOW (ref 22–32)
Calcium: 8.7 mg/dL — ABNORMAL LOW (ref 8.9–10.3)
Chloride: 112 mmol/L — ABNORMAL HIGH (ref 98–111)
Creatinine, Ser: 2.79 mg/dL — ABNORMAL HIGH (ref 0.61–1.24)
GFR calc Af Amer: 26 mL/min — ABNORMAL LOW (ref >60)
GFR calc non Af Amer: 22 mL/min — ABNORMAL LOW (ref >60)
Glucose, Bld: 249 mg/dL — ABNORMAL HIGH (ref 70–99)
Phosphorus: 4.3 mg/dL (ref 2.5–4.6)
Potassium: 5.5 mmol/L — ABNORMAL HIGH (ref 3.5–5.1)
Sodium: 141 mmol/L (ref 135–145)

## 2019-01-08 LAB — COMPREHENSIVE METABOLIC PANEL
ALT: 49 U/L — ABNORMAL HIGH (ref 0–44)
AST: 28 U/L (ref 15–41)
Albumin: 2.5 g/dL — ABNORMAL LOW (ref 3.5–5.0)
Alkaline Phosphatase: 61 U/L (ref 38–126)
Anion gap: 11 (ref 5–15)
BUN: 94 mg/dL — ABNORMAL HIGH (ref 8–23)
CO2: 17 mmol/L — ABNORMAL LOW (ref 22–32)
Calcium: 8.6 mg/dL — ABNORMAL LOW (ref 8.9–10.3)
Chloride: 114 mmol/L — ABNORMAL HIGH (ref 98–111)
Creatinine, Ser: 2.9 mg/dL — ABNORMAL HIGH (ref 0.61–1.24)
GFR calc Af Amer: 25 mL/min — ABNORMAL LOW (ref >60)
GFR calc non Af Amer: 21 mL/min — ABNORMAL LOW (ref >60)
Glucose, Bld: 251 mg/dL — ABNORMAL HIGH (ref 70–99)
Potassium: 5.5 mmol/L — ABNORMAL HIGH (ref 3.5–5.1)
Sodium: 142 mmol/L (ref 135–145)
Total Bilirubin: 1 mg/dL (ref 0.3–1.2)
Total Protein: 5.8 g/dL — ABNORMAL LOW (ref 6.5–8.1)

## 2019-01-08 LAB — D-DIMER, QUANTITATIVE: D-Dimer, Quant: 2.98 ug/mL-FEU — ABNORMAL HIGH (ref 0.00–0.50)

## 2019-01-08 LAB — FERRITIN: Ferritin: 521 ng/mL — ABNORMAL HIGH (ref 24–336)

## 2019-01-08 LAB — VITAMIN B12: Vitamin B-12: 5216 pg/mL — ABNORMAL HIGH (ref 180–914)

## 2019-01-08 LAB — C-REACTIVE PROTEIN: CRP: 4.1 mg/dL — ABNORMAL HIGH (ref <1.0)

## 2019-01-08 LAB — MAGNESIUM: Magnesium: 2.3 mg/dL (ref 1.7–2.4)

## 2019-01-08 MED ORDER — INSULIN GLARGINE 100 UNIT/ML ~~LOC~~ SOLN
26.0000 [IU] | Freq: Every day | SUBCUTANEOUS | Status: DC
  Administered 2019-01-08: 26 [IU] via SUBCUTANEOUS
  Filled 2019-01-08 (×2): qty 0.26

## 2019-01-08 MED ORDER — SODIUM BICARBONATE 650 MG PO TABS
1300.0000 mg | ORAL_TABLET | Freq: Two times a day (BID) | ORAL | Status: DC
  Administered 2019-01-08 – 2019-01-11 (×6): 1300 mg via ORAL
  Filled 2019-01-08 (×6): qty 2

## 2019-01-08 MED ORDER — SODIUM ZIRCONIUM CYCLOSILICATE 10 G PO PACK
10.0000 g | PACK | Freq: Two times a day (BID) | ORAL | Status: AC
  Administered 2019-01-08 – 2019-01-09 (×4): 10 g via ORAL
  Filled 2019-01-08 (×4): qty 1

## 2019-01-08 NOTE — Progress Notes (Addendum)
Inpatient Diabetes Program Recommendations  AACE/ADA: New Consensus Statement on Inpatient Glycemic Control (2015)  Target Ranges:  Prepandial:   less than 140 mg/dL      Peak postprandial:   less than 180 mg/dL (1-2 hours)      Critically ill patients:  140 - 180 mg/dL   Lab Results  Component Value Date   GLUCAP 214 (H) 01/08/2019   HGBA1C 7.8 (H) 12/28/2018    Review of Glycemic Control Results for Jon [name], Jon [name] (MRN [id number]) as of 01/08/2019 15:32  Ref. Range 01/07/2019 12:13 01/07/2019 16:51 01/07/2019 22:19 01/08/2019 07:55 01/08/2019 12:10  Glucose-Capillary Latest Ref Range: 70 - 99 mg/dL 229 (H) 277 (H) 311 (H) 188 (H) 214 (H)  DM 2 Outpatient Diabetes medications: Lantus 24 units + Humalog correction scale (2units per 25 above scale) + Tradjenta qd Current orders for Inpatient glycemic control: Lantus 24 + Tradjenta 5 qd + Novolog correction sensitive tid + Novolog 4 units tid with meals Inpatient Diabetes Program Recommendations:   May consider increasing Lantus to 28 units daily.   Thanks  Adah Perl, RN, BC-ADM Inpatient Diabetes Coordinator Pager [phone] (8a-5p)

## 2019-01-08 NOTE — Progress Notes (Signed)
Admit: 01/03/2019 LOS: 4  28M CKD3T (BL SCr low 2s) recent [place] admission returned with SOB  Subjective:  [name] Pt w/o c/o . SCr slightly worse, K 5.5, TRH ordered Lokelma . Remains on Tac/MMF . Lasix 40 BID IV, Dexamethasone, NaHCO3 . On RA  12/28 0701 - 12/29 0700 In: 515 [P.O.:350; IV Piggyback:165] Out: -   Filed Weights   01/07/19 0500  Weight: 100.2 kg    Scheduled Meds: . aspirin EC  81 mg Oral Daily  . atorvastatin  40 mg Oral Daily  . calcitRIOL  0.25 mcg Oral See admin instructions  . dexamethasone (DECADRON) injection  6 mg Intravenous Q24H  . gabapentin  300 mg Oral BID  . heparin  5,000 Units Subcutaneous Q8H  . hydrALAZINE  25 mg Oral Q8H  . HYDROcodone-acetaminophen  1-2 tablet Oral Q6H  . insulin aspart  0-9 Units Subcutaneous TID WC  . insulin aspart  4 Units Subcutaneous TID WC  . insulin glargine  24 Units Subcutaneous QHS  . labetalol  100 mg Oral BID  . linagliptin  5 mg Oral Daily  . methimazole  2.5 mg Oral Daily  . multivitamin with minerals  1 tablet Oral Daily  . mycophenolate  180 mg Oral BID  . sodium bicarbonate  1,300 mg Oral BID  . sodium zirconium cyclosilicate  10 g Oral BID  . tacrolimus  2 mg Oral BID  . tamsulosin  0.4 mg Oral QHS  . vitamin B-12  1,000 mcg Oral BID   Continuous Infusions:  PRN Meds:.acetaminophen **OR** acetaminophen, hydrALAZINE, ondansetron **OR** ondansetron (ZOFRAN) IV  Current Labs: reviewed    Physical Exam:  Blood pressure 136/68, pulse 70, temperature 98.9 F (37.2 C), temperature source Oral, resp. rate 17, weight 100.2 kg, SpO2 97 %. Due to the nature of this patient's GOVPC-[id number] with isolation and in keeping with efforts to prevent the spread of infection and to conserve personal protective equipment, a physical exam was not personally performed. A chart review of other providers notes and the patient's lab work as well as review of other pertinent studies was performed  A 1. CKD3T and some mild AKI,  sp KT 2015 on Tac/MMF, followed by [place] at [facility]; baseline SCr 2 to 2.5 2. AKI, labile SCr between 2.5 and 3.0 at current time 3. COVID 19, per [facility], on decadron and remdesiver 4. HTN/Vol: BPs stable 5. Hyperkalemia: mild, can use Lokelma as needed 6. NAG Met acidosis on NaHCo3 , increase dose today  P . Cont to hold lasix . Cont Tac/MMF . Will cont to follow . Daily weights, Daily Renal Panel, Strict I/Os, Avoid nephrotoxins (NSAIDs, judicious IV Contrast)    Pearson Grippe MD 01/08/2019, 3:05 PM  Recent Labs  Lab 01/07/19 0500 01/08/19 0348 01/08/19 1417  NA 145 142 141  K 5.0 5.5* 5.5*  CL 113* 114* 112*  CO2 17* 17* 14*  GLUCOSE 197* 251* 249*  BUN 83* 94* 96*  CREATININE 2.68* 2.90* 2.79*  CALCIUM 8.8* 8.6* 8.7*  PHOS  --   --  4.3   Recent Labs  Lab 01/06/19 0426 01/07/19 0500 01/08/19 0348  WBC 7.2 8.0 7.0  NEUTROABS 6.5 7.2 6.2  HGB 11.9* 12.2* 12.4*  HCT 37.1* 38.1* 38.5*  MCV 92.3 90.9 93.7  PLT 202 202 214

## 2019-01-08 NOTE — Progress Notes (Signed)
PROGRESS NOTE    Jon [name]  VOJ:[id number] DOB: Jan 20, 1950 DOA: 01/03/2019 PCP: [name], MD   Brief Narrative:  Jon [name] a 68 y.o.malewithhistory of renal transplant stage IV kidney disease, diabetes mellitus type 2, hypertension and history of stroke who was  discharged 3 days prior to the being admitted for Covid 19 infection with diarrhea was empirically treated with IV fluids at that time patient's Lasix and Bactrim were withheld and discharged home after patient's creatinine improved and diarrhea stopped. Patient after reaching home became more short of breath and since it was persistent patient came back to the ER but denies any chest pain or fever chills. No productive cough.  ED Course:Chest x-ray in the ER shows interstitial changes which could be either from Covid infection or fluid overload. Labs show creatinine of 2.9 which is increased from 2 about 4 days ago potassium 5.7 blood glucose 166 AST and ALT also has increased. LDH 328 ferritin 1247 and CRP 16.5 procalcitonin 0.18 BNP 59. Hemoglobin slightly decreased from baseline is around 11.8 WBC count 6.1 EKG shows normal sinus rhythm. Given the acute respiratory failure with hypoxia, chest x-ray showed infiltrates with normal BNP likely by Covid pneumonia.  Patient was started on IV Decadron and remdesivir and admitted for further management.   Assessment & Plan:   Principal Problem:   Acute respiratory failure with hypoxia (HCC) Active Problems:   Pulmonary hypertension, unspecified (HCC)   Type 2 diabetes mellitus without complications ([place])   AKI (acute kidney injury) ([place])   COVID-19 virus infection   Acute kidney injury superimposed on chronic kidney disease (HCC)   Elevated LFTs   Pneumonia due to COVID-19 virus   Cerebrovascular accident (CVA) ([place])  1 acute respiratory failure with hypoxia secondary to COVID-19 infection and concern for possible volume overload Patient noted to  have presented with worsening shortness of breath, noted to be hypoxic, chest x-ray with bilateral interstitial markings.  Patient recently discharged 3 days prior to admission after being admitted for COVID-19 infection which presented as diarrhea at which time patient had a normal BNP.  Patient currently afebrile.  Due to patient's underlying renal transplant patient at high risk for worsening Covid infection.  Patient states slowly improving with his shortness of breath.  Patient had self 90 -91% on room air.  Inflammatory markers trending down.  Continue IV remdesivir to complete a 5-day course, IV Decadron.  Diuretics on hold.  Continue supportive care.  Follow.   2.  Transaminitis Could likely be secondary to Covid infection.  Abdominal ultrasound with a coarse hepatic echotexture.  Patient currently on methimazole and statins.  LFTs trending down.  Follow.  3.  Diabetes mellitus type 2 with hyperglycemia Hemoglobin A1c 7.8 on 12/28/2018.  CBG of 188 this morning.  Elevated CBGs likely exacerbated with steroids.  Increase Lantus to 26 units daily.  Continue meal coverage NovoLog 4 units 3 times daily with meals, sliding scale insulin.  Continue current dose of gabapentin.   4.  Hypertension Blood pressure improved on current regimen of hydralazine 25 mg 3 times daily and labetalol.  Follow.   5.  Acute kidney injury on chronic kidney disease stage III status post kidney transplant 2015 Renal function improving and close to baseline.  Nephrology consulted and are following.  Continue tacrolimus, mycophenolate, steroids.  Appreciate nephrology input and recommendations.  6.  Non-anion gap metabolic acidosis Likely secondary to chronic kidney disease.  Continue oral bicarb tablets.  Per nephrology.  7.  Hyperkalemia Initially improved after Lokelma.  Potassium at 5.5 this morning.  Will place on Lokelma and repeat labs this afternoon.  Lasix on hold.  Change diet to renal diet.  Nephrology  following.   8.  History of benign follicular nodule status post thyroid surgery 10/13/2015 /?hyperthyroidism Continue home regimen methimazole.  Follow.  9.  History of cryptogenic CVA Continue aspirin for secondary stroke prophylaxis.  Continue statin.  PT/OT.  Patient will likely need home health therapies.     DVT prophylaxis: Heparin Code Status: Full Family Communication: Updated patient.  Updated wife on telephone.  Disposition Plan: Likely home when clinically improved, improvement with renal function and when cleared by nephrology..   Consultants:   Nephrology: Dr. Moshe Cipro 01/05/2019  Procedures:   Chest x-ray 01/04/2019  CT head 01/07/2019  Antimicrobials:   IV remdesivir 01/04/2019>>>>> 01/08/2019   Subjective: Patient sleeping but arousable.  Lying flat in bed.  States shortness of breath slowly improving.  Denies any chest pain.  Denies any further diarrhea.  Still with some fatigue and generalized weakness.    Objective: Vitals:   01/07/19 2030 01/07/19 2216 01/08/19 0450 01/08/19 0800  BP:  (!) 147/69 126/71 126/70  Pulse:  74 70 71  Resp:  13 15 14   Temp:  97.8 F (36.6 C) 98.9 F (37.2 C) 98.7 F (37.1 C)  TempSrc:  Oral Oral Oral  SpO2: 90% 91% 91% 91%  Weight:        Intake/Output Summary (Last 24 hours) at 01/08/2019 1200 Last data filed at 01/08/2019 0943 Gross per 24 hour  Intake 725 ml  Output 500 ml  Net 225 ml   Filed Weights   01/07/19 0500  Weight: 100.2 kg    Examination:  General exam: NAD.  Respiratory system: Decreased coarse breath sounds.  No wheezing.  Speaking in full sentences.  Normal respiratory effort.  Cardiovascular system: RRR no murmurs rubs or gallops.  No JVD.  No lower extremity edema.  Gastrointestinal system: Abdomen is nontender, nondistended, soft, positive bowel sounds.  No rebound.  No guarding.  Central nervous system: Alert and oriented. No focal neurological deficits. Extremities: Symmetric 5  x 5 power. Skin: No rashes, lesions or ulcers Psychiatry: Judgement and insight appear fair. Mood & affect appropriate.     Data Reviewed: I have personally reviewed following labs and imaging studies  CBC: Recent Labs  Lab 01/04/19 0011 01/04/19 0800 01/06/19 0426 01/07/19 0500 01/08/19 0348  WBC 6.1 5.5 7.2 8.0 7.0  NEUTROABS 5.1 5.0 6.5 7.2 6.2  HGB 11.8* 11.2* 11.9* 12.2* 12.4*  HCT 36.9* 35.2* 37.1* 38.1* 38.5*  MCV 93.4 94.6 92.3 90.9 93.7  PLT 198 181 202 202 034   Basic Metabolic Panel: Recent Labs  Lab 01/04/19 0011 01/04/19 0800 01/06/19 0426 01/07/19 0500 01/08/19 0348  NA 141 146* 144 145 142  K 5.7* 5.9* 5.4* 5.0 5.5*  CL 118* 120* 119* 113* 114*  CO2 16* 15* 15* 17* 17*  GLUCOSE 166* 167* 255* 197* 251*  BUN 76* 70* 71* 83* 94*  CREATININE 2.95* 2.66* 2.35* 2.68* 2.90*  CALCIUM 8.7* 8.6* 8.9 8.8* 8.6*  MG  --   --  2.2 2.2 2.3   GFR: Estimated Creatinine Clearance: 29.8 mL/min (A) (by C-G formula based on SCr of 2.9 mg/dL (H)). Liver Function Tests: Recent Labs  Lab 01/04/19 0011 01/04/19 0800 01/06/19 0426 01/07/19 0500 01/08/19 0348  AST 85* 55* 25 21 28   ALT 106* 91* 74* 62*  49*  ALKPHOS 64 59 64 65 61  BILITOT 0.7 0.2* 0.1* 0.3 1.0  PROT 6.1* 5.7* 6.2* 6.1* 5.8*  ALBUMIN 2.5* 2.3* 2.5* 2.5* 2.5*   No results for input(s): LIPASE, AMYLASE in the last 168 hours. No results for input(s): AMMONIA in the last 168 hours. Coagulation Profile: No results for input(s): INR, PROTIME in the last 168 hours. Cardiac Enzymes: No results for input(s): CKTOTAL, CKMB, CKMBINDEX, TROPONINI in the last 168 hours. BNP (last 3 results) No results for input(s): PROBNP in the last 8760 hours. HbA1C: No results for input(s): HGBA1C in the last 72 hours. CBG: Recent Labs  Lab 01/07/19 0802 01/07/19 1213 01/07/19 1651 01/07/19 2219 01/08/19 0755  GLUCAP 176* 229* 277* 311* 188*   Lipid Profile: No results for input(s): CHOL, HDL, LDLCALC, TRIG,  CHOLHDL, LDLDIRECT in the last 72 hours. Thyroid Function Tests: No results for input(s): TSH, T4TOTAL, FREET4, T3FREE, THYROIDAB in the last 72 hours. Anemia Panel: Recent Labs    01/07/19 0500 01/08/19 0348  VITAMINB12  --  5,216*  FERRITIN 728* 521*   Sepsis Labs: Recent Labs  Lab 01/04/19 0011  PROCALCITON 0.18  LATICACIDVEN 1.1    Recent Results (from the past 240 hour(s))  Blood Culture (routine x 2)     Status: None (Preliminary result)   Collection Time: 01/03/19 11:55 PM   Specimen: BLOOD  Result Value Ref Range Status   Specimen Description BLOOD RIGHT UPPER ARM  Final   Special Requests   Final    BOTTLES DRAWN AEROBIC AND ANAEROBIC Blood Culture results may not be optimal due to an inadequate volume of blood received in culture bottles   Culture   Final    NO GROWTH 4 DAYS Performed at [facility], [place] [address] [place], [place] [address]    Report Status PENDING  Incomplete         Radiology Studies: CT HEAD WO CONTRAST  Result Date: 01/07/2019 CLINICAL DATA:  Encephalopathy.  COVID-19 positive EXAM: CT HEAD WITHOUT CONTRAST TECHNIQUE: Contiguous axial images were obtained from the base of the skull through the vertex without intravenous contrast. COMPARISON:  CT head 07/26/2017 FINDINGS: Brain: Mild cerebral atrophy. Chronic infarct left parietal operculum is unchanged. Negative for acute infarct, hemorrhage, mass. Vascular: Negative for hyperdense vessel Skull: Negative Sinuses/Orbits: Mucosal edema sphenoid sinus. Remaining sinuses clear. Bilateral cataract surgery. Other: None IMPRESSION: No acute abnormality. Chronic infarct left parietal operculum is unchanged. Electronically Signed   By: Franchot Gallo M.D.   On: 01/07/2019 21:39        Scheduled Meds: . aspirin EC  81 mg Oral Daily  . atorvastatin  40 mg Oral Daily  . calcitRIOL  0.25 mcg Oral See admin instructions  . dexamethasone (DECADRON) injection  6 mg Intravenous Q24H  .  gabapentin  300 mg Oral BID  . heparin  5,000 Units Subcutaneous Q8H  . hydrALAZINE  25 mg Oral Q8H  . HYDROcodone-acetaminophen  1-2 tablet Oral Q6H  . insulin aspart  0-9 Units Subcutaneous TID WC  . insulin aspart  4 Units Subcutaneous TID WC  . insulin glargine  24 Units Subcutaneous QHS  . labetalol  100 mg Oral BID  . linagliptin  5 mg Oral Daily  . methimazole  2.5 mg Oral Daily  . multivitamin with minerals  1 tablet Oral Daily  . mycophenolate  180 mg Oral BID  . sodium bicarbonate  650 mg Oral BID  . sodium zirconium cyclosilicate  10  g Oral BID  . tacrolimus  2 mg Oral BID  . tamsulosin  0.4 mg Oral QHS  . vitamin B-12  1,000 mcg Oral BID   Continuous Infusions:    LOS: 4 days    Time spent: 40 minutes    Irine Seal, MD [facility]  If 7PM-7AM, please contact night-coverage [email or web address] 01/08/2019, 12:00 PM

## 2019-01-08 NOTE — Progress Notes (Signed)
Patient ambulated to bathroom. Oxygen saturations maintained greater than 90%. Patient placed in chair. Will continue to monitor.  Hiram Comber, RN 01/08/2019 9:49 AM

## 2019-01-09 LAB — COMPREHENSIVE METABOLIC PANEL
ALT: 45 U/L — ABNORMAL HIGH (ref 0–44)
AST: 18 U/L (ref 15–41)
Albumin: 2.4 g/dL — ABNORMAL LOW (ref 3.5–5.0)
Alkaline Phosphatase: 69 U/L (ref 38–126)
Anion gap: 10 (ref 5–15)
BUN: 84 mg/dL — ABNORMAL HIGH (ref 8–23)
CO2: 21 mmol/L — ABNORMAL LOW (ref 22–32)
Calcium: 8.7 mg/dL — ABNORMAL LOW (ref 8.9–10.3)
Chloride: 110 mmol/L (ref 98–111)
Creatinine, Ser: 2.45 mg/dL — ABNORMAL HIGH (ref 0.61–1.24)
GFR calc Af Amer: 30 mL/min — ABNORMAL LOW (ref >60)
GFR calc non Af Amer: 26 mL/min — ABNORMAL LOW (ref >60)
Glucose, Bld: 201 mg/dL — ABNORMAL HIGH (ref 70–99)
Potassium: 5 mmol/L (ref 3.5–5.1)
Sodium: 141 mmol/L (ref 135–145)
Total Bilirubin: 0.3 mg/dL (ref 0.3–1.2)
Total Protein: 5.6 g/dL — ABNORMAL LOW (ref 6.5–8.1)

## 2019-01-09 LAB — CBC WITH DIFFERENTIAL/PLATELET
Abs Immature Granulocytes: 0.05 10*3/uL (ref 0.00–0.07)
Basophils Absolute: 0 10*3/uL (ref 0.0–0.1)
Basophils Relative: 0 %
Eosinophils Absolute: 0 10*3/uL (ref 0.0–0.5)
Eosinophils Relative: 1 %
HCT: 36.3 % — ABNORMAL LOW (ref 39.0–52.0)
Hemoglobin: 11.9 g/dL — ABNORMAL LOW (ref 13.0–17.0)
Immature Granulocytes: 1 %
Lymphocytes Relative: 4 %
Lymphs Abs: 0.3 10*3/uL — ABNORMAL LOW (ref 0.7–4.0)
MCH: 29.8 pg (ref 26.0–34.0)
MCHC: 32.8 g/dL (ref 30.0–36.0)
MCV: 90.8 fL (ref 80.0–100.0)
Monocytes Absolute: 0.5 10*3/uL (ref 0.1–1.0)
Monocytes Relative: 6 %
Neutro Abs: 6.3 10*3/uL (ref 1.7–7.7)
Neutrophils Relative %: 88 %
Platelets: 192 10*3/uL (ref 150–400)
RBC: 4 MIL/uL — ABNORMAL LOW (ref 4.22–5.81)
RDW: 13.4 % (ref 11.5–15.5)
WBC: 7.1 10*3/uL (ref 4.0–10.5)
nRBC: 0 % (ref 0.0–0.2)

## 2019-01-09 LAB — GLUCOSE, CAPILLARY
Glucose-Capillary: 221 mg/dL — ABNORMAL HIGH (ref 70–99)
Glucose-Capillary: 338 mg/dL — ABNORMAL HIGH (ref 70–99)
Glucose-Capillary: 331 mg/dL — ABNORMAL HIGH (ref 70–99)
Glucose-Capillary: 250 mg/dL — ABNORMAL HIGH (ref 70–99)

## 2019-01-09 LAB — FERRITIN: Ferritin: 419 ng/mL — ABNORMAL HIGH (ref 24–336)

## 2019-01-09 LAB — CULTURE, BLOOD (ROUTINE X 2): Culture: NO GROWTH

## 2019-01-09 LAB — D-DIMER, QUANTITATIVE: D-Dimer, Quant: 2.62 ug/mL-FEU — ABNORMAL HIGH (ref 0.00–0.50)

## 2019-01-09 LAB — MAGNESIUM: Magnesium: 2.2 mg/dL (ref 1.7–2.4)

## 2019-01-09 LAB — C-REACTIVE PROTEIN: CRP: 4.7 mg/dL — ABNORMAL HIGH (ref <1.0)

## 2019-01-09 MED ORDER — INSULIN GLARGINE 100 UNIT/ML ~~LOC~~ SOLN
30.0000 [IU] | Freq: Every day | SUBCUTANEOUS | Status: DC
  Administered 2019-01-09 – 2019-01-10 (×2): 30 [IU] via SUBCUTANEOUS
  Filled 2019-01-09 (×4): qty 0.3

## 2019-01-09 NOTE — Progress Notes (Addendum)
SATURATION QUALIFICATIONS: (This note is used to comply with regulatory documentation for home oxygen)  Patient Saturations on Room Air at Rest = 95%  Patient Saturations on Room Air while Ambulating = 92%  Patient Saturations on 1 Liters of oxygen while Ambulating = 100%   Hiram Comber, RN 01/09/2019 9:35 AM

## 2019-01-09 NOTE — Progress Notes (Signed)
PROGRESS NOTE    Jon [name]  ATF:[id number] DOB: 04-02-1950 DOA: 01/03/2019 PCP: [name], MD   Brief Narrative:  [name] a 68 y.o.malewithhistory of renal transplant stage IV kidney disease, diabetes mellitus type 2, hypertension and history of stroke who was  discharged 3 days prior to the being admitted for Covid 19 infection with diarrhea was empirically treated with IV fluids at that time patient's Lasix and Bactrim were withheld and discharged home after patient's creatinine improved and diarrhea stopped. Patient after reaching home became more short of breath and since it was persistent patient came back to the ER but denies any chest pain or fever chills. No productive cough.  ED Course:Chest x-ray in the ER shows interstitial changes which could be either from Covid infection or fluid overload. Labs show creatinine of 2.9 which is increased from 2 about 4 days ago potassium 5.7 blood glucose 166 AST and ALT also has increased. LDH 328 ferritin 1247 and CRP 16.5 procalcitonin 0.18 BNP 59. Hemoglobin slightly decreased from baseline is around 11.8 WBC count 6.1 EKG shows normal sinus rhythm. Given the acute respiratory failure with hypoxia, chest x-ray showed infiltrates with normal BNP likely by Covid pneumonia.  Patient was started on IV Decadron and remdesivir and admitted for further management.   Assessment & Plan:   Principal Problem:   Acute respiratory failure with hypoxia (HCC) Active Problems:   Pulmonary hypertension, unspecified (HCC)   Type 2 diabetes mellitus without complications ([place])   AKI (acute kidney injury) ([place])   COVID-19 virus infection   Acute kidney injury superimposed on chronic kidney disease (HCC)   Elevated LFTs   Pneumonia due to COVID-19 virus   Cerebrovascular accident (CVA) ([place])  1 acute respiratory failure with hypoxia secondary to COVID-19 infection and concern for possible volume overload Patient noted to  have presented with worsening shortness of breath, noted to be hypoxic, chest x-ray with bilateral interstitial markings.  Patient recently discharged 3 days prior to admission after being admitted for COVID-19 infection which presented as diarrhea at which time patient had a normal BNP.  Patient currently afebrile.  Hypoxia improving. Due to patient's underlying renal transplant patient at high risk for worsening Covid infection.  Patient states slowly improving with his shortness of breath.  Patient had sats of 94% on room air.  Inflammatory markers trending down.  S/p 5 days IV remdesivir,  IV Decadron.  Diuretics on hold and likely resume when okay with nephrology.  Continue supportive care.  Follow.   2.  Transaminitis Could likely be secondary to Covid infection.  Abdominal ultrasound with a coarse hepatic echotexture.  Patient currently on methimazole and statins.  LFTs trending down.  Follow.  3.  Diabetes mellitus type 2 with hyperglycemia Hemoglobin A1c 7.8 on 12/28/2018.  CBG of 221 this morning.  Elevated CBGs likely exacerbated with steroids.  Increase Lantus to 30 units daily.  Continue meal coverage NovoLog 4 units 3 times daily with meals, sliding scale insulin.  Continue current dose of gabapentin.   4.  Hypertension Better controlled on current regimen of hydralazine and labetalol.  Follow.    5.  Acute kidney injury on chronic kidney disease stage III status post kidney transplant 2015 Renal function improving and close to baseline.  Nephrology consulted and are following.  Continue tacrolimus, mycophenolate, steroids.  Appreciate nephrology input and recommendations.  6.  Non-anion gap metabolic acidosis Likely secondary to chronic kidney disease.  Acidosis improving on bicarb tablets  which we will continue for now. Per nephrology.    7.  Hyperkalemia Initially improved after Lokelma.  Potassium at 5.5 the morning of 01/08/2019.  Patient started on Lokelma and diet change to a  renal diet.  Potassium at 5.0 this morning.  Lasix on hold.  Repeat labs in the morning.  Per nephrology, once patient's diuretics are resumed could likely hold Lokelma at that time.  Per nephrology.   8.  History of benign follicular nodule status post thyroid surgery 10/13/2015 /?hyperthyroidism Continue methimazole.  Outpatient follow-up.   9.  History of cryptogenic CVA Continue aspirin for secondary stroke prophylaxis.  Continue statin.  PT/OT.  Patient will likely need home health therapies which have been ordered..     DVT prophylaxis: Heparin Code Status: Full Family Communication: Updated patient.   Disposition Plan: Likely home when clinically improved, improvement with renal function and improvement with hyperkalemia and when cleared by nephrology hopefully tomorrow..   Consultants:   Nephrology: Dr. Moshe Cipro 01/05/2019  Procedures:   Chest x-ray 01/04/2019  CT head 01/07/2019  Antimicrobials:   IV remdesivir 01/04/2019>>>>> 01/08/2019   Subjective: Patient sitting up in chair.  Feeling better.  Denies any significant shortness of breath.  No chest pain.  No abdominal pain.   Objective: Vitals:   01/09/19 0400 01/09/19 0652 01/09/19 0800 01/09/19 0920  BP: (!) 145/80  (!) 151/75 132/73  Pulse: 81  61 77  Resp: 15  10 12   Temp:  97.8 F (36.6 C) (!) 97.3 F (36.3 C)   TempSrc:  Oral Oral   SpO2: 93%  95% 94%  Weight:        Intake/Output Summary (Last 24 hours) at 01/09/2019 1156 Last data filed at 01/09/2019 1100 Gross per 24 hour  Intake 220 ml  Output 1150 ml  Net -930 ml   Filed Weights   01/07/19 0500  Weight: 100.2 kg    Examination:  General exam: NAD.  Respiratory system: Decreased breath sounds in the bases otherwise clear.  No wheezing, no crackles, no rhonchi.  Speaking in full sentences.  Normal respiratory effort. Cardiovascular system: Regular rate rhythm no murmurs rubs or gallops.  No JVD.  No lower extremity edema.   Gastrointestinal system: Abdomen is soft, nontender, nondistended, positive bowel sounds.  No rebound.  No guarding.  Central nervous system: Alert and oriented. No focal neurological deficits. Extremities: Symmetric 5 x 5 power. Skin: No rashes, lesions or ulcers Psychiatry: Judgement and insight appear fair. Mood & affect appropriate.     Data Reviewed: I have personally reviewed following labs and imaging studies  CBC: Recent Labs  Lab 01/04/19 0800 01/06/19 0426 01/07/19 0500 01/08/19 0348 01/09/19 0332  WBC 5.5 7.2 8.0 7.0 7.1  NEUTROABS 5.0 6.5 7.2 6.2 6.3  HGB 11.2* 11.9* 12.2* 12.4* 11.9*  HCT 35.2* 37.1* 38.1* 38.5* 36.3*  MCV 94.6 92.3 90.9 93.7 90.8  PLT 181 202 202 214 809   Basic Metabolic Panel: Recent Labs  Lab 01/06/19 0426 01/07/19 0500 01/08/19 0348 01/08/19 1417 01/09/19 0332  NA 144 145 142 141 141  K 5.4* 5.0 5.5* 5.5* 5.0  CL 119* 113* 114* 112* 110  CO2 15* 17* 17* 14* 21*  GLUCOSE 255* 197* 251* 249* 201*  BUN 71* 83* 94* 96* 84*  CREATININE 2.35* 2.68* 2.90* 2.79* 2.45*  CALCIUM 8.9 8.8* 8.6* 8.7* 8.7*  MG 2.2 2.2 2.3  --  2.2  PHOS  --   --   --  4.3  --  GFR: Estimated Creatinine Clearance: 35.3 mL/min (A) (by C-G formula based on SCr of 2.45 mg/dL (H)). Liver Function Tests: Recent Labs  Lab 01/04/19 0800 01/06/19 0426 01/07/19 0500 01/08/19 0348 01/08/19 1417 01/09/19 0332  AST 55* 25 21 28   --  18  ALT 91* 74* 62* 49*  --  45*  ALKPHOS 59 64 65 61  --  69  BILITOT 0.2* 0.1* 0.3 1.0  --  0.3  PROT 5.7* 6.2* 6.1* 5.8*  --  5.6*  ALBUMIN 2.3* 2.5* 2.5* 2.5* 2.4* 2.4*   No results for input(s): LIPASE, AMYLASE in the last 168 hours. No results for input(s): AMMONIA in the last 168 hours. Coagulation Profile: No results for input(s): INR, PROTIME in the last 168 hours. Cardiac Enzymes: No results for input(s): CKTOTAL, CKMB, CKMBINDEX, TROPONINI in the last 168 hours. BNP (last 3 results) No results for input(s): PROBNP  in the last 8760 hours. HbA1C: No results for input(s): HGBA1C in the last 72 hours. CBG: Recent Labs  Lab 01/08/19 0755 01/08/19 1210 01/08/19 1702 01/08/19 2132 01/09/19 0807  GLUCAP 188* 214* 325* 246* 221*   Lipid Profile: No results for input(s): CHOL, HDL, LDLCALC, TRIG, CHOLHDL, LDLDIRECT in the last 72 hours. Thyroid Function Tests: No results for input(s): TSH, T4TOTAL, FREET4, T3FREE, THYROIDAB in the last 72 hours. Anemia Panel: Recent Labs    01/08/19 0348 01/09/19 0332  VITAMINB12 5,216*  --   FERRITIN 521* 419*   Sepsis Labs: Recent Labs  Lab 01/04/19 0011  PROCALCITON 0.18  LATICACIDVEN 1.1    Recent Results (from the past 240 hour(s))  Blood Culture (routine x 2)     Status: None   Collection Time: 01/03/19 11:55 PM   Specimen: BLOOD  Result Value Ref Range Status   Specimen Description BLOOD RIGHT UPPER ARM  Final   Special Requests   Final    BOTTLES DRAWN AEROBIC AND ANAEROBIC Blood Culture results may not be optimal due to an inadequate volume of blood received in culture bottles   Culture   Final    NO GROWTH 5 DAYS Performed at [facility], [place] [address] [place], [place] [address]    Report Status 01/09/2019 FINAL  Final         Radiology Studies: CT HEAD WO CONTRAST  Result Date: 01/07/2019 CLINICAL DATA:  Encephalopathy.  COVID-19 positive EXAM: CT HEAD WITHOUT CONTRAST TECHNIQUE: Contiguous axial images were obtained from the base of the skull through the vertex without intravenous contrast. COMPARISON:  CT head 07/26/2017 FINDINGS: Brain: Mild cerebral atrophy. Chronic infarct left parietal operculum is unchanged. Negative for acute infarct, hemorrhage, mass. Vascular: Negative for hyperdense vessel Skull: Negative Sinuses/Orbits: Mucosal edema sphenoid sinus. Remaining sinuses clear. Bilateral cataract surgery. Other: None IMPRESSION: No acute abnormality. Chronic infarct left parietal operculum is unchanged. Electronically  Signed   By: Franchot Gallo M.D.   On: 01/07/2019 21:39        Scheduled Meds: . aspirin EC  81 mg Oral Daily  . atorvastatin  40 mg Oral Daily  . calcitRIOL  0.25 mcg Oral See admin instructions  . dexamethasone (DECADRON) injection  6 mg Intravenous Q24H  . gabapentin  300 mg Oral BID  . heparin  5,000 Units Subcutaneous Q8H  . hydrALAZINE  25 mg Oral Q8H  . HYDROcodone-acetaminophen  1-2 tablet Oral Q6H  . insulin aspart  0-9 Units Subcutaneous TID WC  . insulin aspart  4 Units Subcutaneous TID WC  . insulin  glargine  26 Units Subcutaneous QHS  . labetalol  100 mg Oral BID  . linagliptin  5 mg Oral Daily  . methimazole  2.5 mg Oral Daily  . multivitamin with minerals  1 tablet Oral Daily  . mycophenolate  180 mg Oral BID  . sodium bicarbonate  1,300 mg Oral BID  . sodium zirconium cyclosilicate  10 g Oral BID  . tacrolimus  2 mg Oral BID  . tamsulosin  0.4 mg Oral QHS   Continuous Infusions:    LOS: 5 days    Time spent: 40 minutes    Irine Seal, MD [facility]  If 7PM-7AM, please contact night-coverage [email or web address] 01/09/2019, 11:56 AM

## 2019-01-09 NOTE — Plan of Care (Signed)
  Problem: Education: Goal: Knowledge of risk factors and measures for prevention of condition will improve Outcome: Progressing   Problem: Coping: Goal: Psychosocial and spiritual needs will be supported Outcome: Progressing   Problem: Respiratory: Goal: Will maintain a patent airway Outcome: Progressing   

## 2019-01-09 NOTE — Progress Notes (Signed)
Physical Therapy Treatment Patient Details Name: Jon [name] MRN: [id number] DOB: 04-30-1950 Today's Date: 01/09/2019    History of Present Illness 68 y.o. male with history of renal transplant stage IV kidney disease now, diabetes mellitus type 2, hypertension and history of stroke who was just discharged 12/22 after being admitted for Covid 19 infection with diarrhea. Returned to ED 12/25 with SoB where Chest x-ray in the ER shows interstitial changes which could be either from Covid infection or fluid overload admitted 12/25 for treatment of acute respiratory failure with hypoxia secondary to COVID.    PT Comments    Patient continues with some confusion although better than 12/28 session. Focused on balance and strengthening exercises as pt had recently walked with RN for O2 saturation testing. He continues to require up to min assist to maintain his balance with tendency to lean posteriorly.     Follow Up Recommendations  Home health PT     Equipment Recommendations  None recommended by PT    Recommendations for Other Services       Precautions / Restrictions Precautions Precautions: Fall Precaution Comments: reports he has had near falls Restrictions Weight Bearing Restrictions: No    Mobility  Bed Mobility                  Transfers Overall transfer level: Needs assistance Equipment used: Rolling walker (2 wheeled) Transfers: Sit to/from Stand Sit to Stand: Min guard;Min assist         General transfer comment: min assist 1st transfer due to imbalance posterior; progressed to minguard with 4 more reps  Ambulation/Gait             General Gait Details: pt had recently walked with nursing, therefore focused on ther-ex   Stairs             Wheelchair Mobility    Modified Rankin (Stroke Patients Only)       Balance Overall balance assessment: Needs assistance Sitting-balance support: Feet supported;No upper extremity  supported Sitting balance-Leahy Scale: Fair     Standing balance support: Bilateral upper extremity supported Standing balance-Leahy Scale: Poor Standing balance comment: requires UE assist for balance on RW and therapist min A with more narrow BOS (pt self-selects wide BOS)               High Level Balance Comments: attempted standing without UE support at RW, however he began to lose his balance posteriorly; with bil UE support with eyes closed without imbalance; wt-shifting with UE assist on RW; heel raises with bil UE support            Cognition Arousal/Alertness: Awake/alert Behavior During Therapy: WFL for tasks assessed/performed Overall Cognitive Status: Impaired/Different from baseline Area of Impairment: Orientation;Problem solving                 Orientation Level: Disoriented to;Time           Problem Solving: Slow processing;Requires verbal cues General Comments: pt requiring initiation cues for tasks; initially stated 2001, then 2019 then saw the date written on his white board and answered correctly      Exercises      General Comments General comments (skin integrity, edema, etc.): pt required seated rest breaks between standing exercises; VSS on room air      Pertinent Vitals/Pain Pain Assessment: Faces Faces Pain Scale: No hurt    Home Living  Prior Function            PT Goals (current goals can now be found in the care plan section) Acute Rehab PT Goals Patient Stated Goal: go home Time For Goal Achievement: 01/21/19 Potential to Achieve Goals: Fair Progress towards PT goals: Progressing toward goals    Frequency    Min 3X/week      PT Plan Current plan remains appropriate    Co-evaluation              AM-PAC PT "6 Clicks" Mobility   Outcome Measure  Help needed turning from your back to your side while in a flat bed without using bedrails?: None Help needed moving from lying on  your back to sitting on the side of a flat bed without using bedrails?: A Lot Help needed moving to and from a bed to a chair (including a wheelchair)?: A Little Help needed standing up from a chair using your arms (e.g., wheelchair or bedside chair)?: A Little Help needed to walk in hospital room?: A Little Help needed climbing 3-5 steps with a railing? : A Lot 6 Click Score: 17    End of Session   Activity Tolerance: Patient limited by fatigue Patient left: in chair;with call bell/phone within reach(pt up in chair on arrival; no chair alarm)   PT Visit Diagnosis: Unsteadiness on feet (R26.81);Other abnormalities of gait and mobility (R26.89);Muscle weakness (generalized) (M62.81);Difficulty in walking, not elsewhere classified (R26.2)     Time: 8242-3536 PT Time Calculation (min) (ACUTE ONLY): 20 min  Charges:  $Therapeutic Activity: 8-22 mins                      Jon [name], PT Pager [phone]    Rexanne Mano 01/09/2019, 12:17 PM

## 2019-01-09 NOTE — Progress Notes (Signed)
Results for KALOB, BERGEN (MRN [id number]) as of 01/09/2019 14:14  Ref. Range 01/08/2019 12:10 01/08/2019 17:02 01/08/2019 21:32 01/09/2019 08:07 01/09/2019 12:51  Glucose-Capillary Latest Ref Range: 70 - 99 mg/dL 214 (H) 325 (H) 246 (H) 221 (H) 338 (H)  Noted that blood sugars continue to be greater than 200 mg/dl.   Recommend increasing Lantus to 30 units at HS, increase Novolog correction scale to MODERATE TID, and continue Novolog 4 units TID meal coverage if eating at least 50% of meal.  Titrate dosages as needed.   Harvel Ricks RN BSN CDE Diabetes Coordinator Pager: [phone]  8am-5pm

## 2019-01-09 NOTE — Consult Note (Signed)
   [facility] CM Inpatient Consult   01/09/2019  Jon [name] 1950/09/15 [id number]   Patient screened for extreme high risk score for unplanned readmission score of 31%  and for less than 7 days re-hospitalizations.  Patient had previously been active with  [facility] team members for services for disease management and pharmacy assistance medication cost needs. Patient in the [facility] [ACO].   Review of patient's medical record reveals patient is readmitted with COVID -19 infection. MD notes includes but not limited to  [01/08/2019 from Dr. Melodie Bouillon note]  of 1 acute respiratory failure with hypoxia secondary to COVID-19 infection and concern for possible volume overload Patient noted to have presented with worsening shortness of breath, noted to be hypoxic, chest x-ray with bilateral interstitial markings.  Patient recently discharged 3 days prior to admission after being admitted for COVID-19 infection which presented as diarrhea at which time patient had a normal BNP.   Primary Care Provider is Dr. Lovette Cliche II [facility] in [place], this provider is listed to provide the transition of care follow up.  Pharmacy is: [employer] account planner     Plan:  Follow up with inpatient [facility] team for disposition needs and made [facility] aware. Patient is currently being recommended for home with home health. Call to patient's room to follow up on needs but no answer currently after several rings.   Continue to follow progress and disposition to assess for post hospital care management needs and further attempt to reach out to patient.    Please place a [facility] Care Management consult as appropriate and for questions contact:   Natividad Brood, RN BSN [facility] Liaison  [phone] business mobile phone Toll free office [phone]  Fax number:  [phone] [email or web address]

## 2019-01-09 NOTE — Progress Notes (Signed)
Admit: 01/03/2019 LOS: 5  45M CKD3T (BL SCr low 2s) recent [place] admission returned with SOB  Subjective:  Jon [name] Kitchen Pt w/o c/o, Feels improved, on RA . SCr, HCO3, and K better . On Lokelma,   12/29 0701 - 12/30 0700 In: 430 [P.O.:330; IV Piggyback:100] Out: 1150 [Urine:1150]  Filed Weights   01/07/19 0500  Weight: 100.2 kg    Scheduled Meds: . aspirin EC  81 mg Oral Daily  . atorvastatin  40 mg Oral Daily  . calcitRIOL  0.25 mcg Oral See admin instructions  . dexamethasone (DECADRON) injection  6 mg Intravenous Q24H  . gabapentin  300 mg Oral BID  . heparin  5,000 Units Subcutaneous Q8H  . hydrALAZINE  25 mg Oral Q8H  . HYDROcodone-acetaminophen  1-2 tablet Oral Q6H  . insulin aspart  0-9 Units Subcutaneous TID WC  . insulin aspart  4 Units Subcutaneous TID WC  . insulin glargine  26 Units Subcutaneous QHS  . labetalol  100 mg Oral BID  . linagliptin  5 mg Oral Daily  . methimazole  2.5 mg Oral Daily  . multivitamin with minerals  1 tablet Oral Daily  . mycophenolate  180 mg Oral BID  . sodium bicarbonate  1,300 mg Oral BID  . sodium zirconium cyclosilicate  10 g Oral BID  . tacrolimus  2 mg Oral BID  . tamsulosin  0.4 mg Oral QHS   Continuous Infusions:  PRN Meds:.acetaminophen **OR** acetaminophen, hydrALAZINE, ondansetron **OR** ondansetron (ZOFRAN) IV  Current Labs: reviewed    Physical Exam:  Blood pressure 128/66, pulse 75, temperature 98 F (36.7 C), temperature source Oral, resp. rate 15, weight 100.2 kg, SpO2 95 %. NAD RRR CTAB NO sig LEE S/nt/nd  A 1. CKD3T and some mild AKI, sp KT 2015 on Tac/MMF, followed by [place] at [facility]; baseline SCr 2 to 2.5 2. AKI, labile SCr but appears improved 3. COVID 19, per TRH, on decadron and remdesiver 4. HTN/Vol: BPs stable 5. Hyperkalemia: mild, on daily Lokelma right now 6. NAG Met acidosis on NaHCo3 ,stable  P . Cont Tac/MMF . Might be able to resume lasix in next d ay or so, probably can hold Lokelma at  that time . Will cont to follow . Daily weights, Daily Renal Panel, Strict I/Os, Avoid nephrotoxins (NSAIDs, judicious IV Contrast)    Pearson Grippe MD 01/09/2019, 1:50 PM  Recent Labs  Lab 01/08/19 0348 01/08/19 1417 01/09/19 0332  NA 142 141 141  K 5.5* 5.5* 5.0  CL 114* 112* 110  CO2 17* 14* 21*  GLUCOSE 251* 249* 201*  BUN 94* 96* 84*  CREATININE 2.90* 2.79* 2.45*  CALCIUM 8.6* 8.7* 8.7*  PHOS  --  4.3  --    Recent Labs  Lab 01/07/19 0500 01/08/19 0348 01/09/19 0332  WBC 8.0 7.0 7.1  NEUTROABS 7.2 6.2 6.3  HGB 12.2* 12.4* 11.9*  HCT 38.1* 38.5* 36.3*  MCV 90.9 93.7 90.8  PLT 202 214 192

## 2019-01-09 NOTE — TOC Initial Note (Signed)
Transition of Care [facility]) - Initial/Assessment Note    Patient Details  Name: Jon [name] MRN: [id number] Date of Birth: 1950-06-30  Transition of Care [facility]) CM/SW Contact:    Pollie Friar, RN Phone Number: 01/09/2019, 3:52 PM  Clinical Narrative:                 Pt with orders for [facility] services. CM called and spoke with the patient and [facility] selected. Cory with [facility] accepted the referral.  TOC following for further d/c needs.   Expected Discharge Plan: [place] Barriers to Discharge: Continued Medical Work up   Patient Goals and CMS Choice   CMS Medicare.gov Compare Post Acute Care list provided to:: Patient Choice offered to / list presented to : Patient  Expected Discharge Plan and Services Expected Discharge Plan: [place]   Discharge Planning Services: CM Consult Post Acute Care Choice: Home Health                             HH Arranged: PT, OT, Nurse's Aide [facility] Agency: [place] Date [facility] Agency Contacted: 01/09/19   Representative spoke with at [place]: Tommi Rumps  Prior Living Arrangements/Services   Lives with:: Spouse Patient language and need for interpreter reviewed:: Yes Do you feel safe going back to the place where you live?: Yes      Need for Family Participation in Patient Care: No (Comment) Care giver support system in place?: Yes (comment)   Criminal Activity/Legal Involvement Pertinent to Current Situation/Hospitalization: No - Comment as needed  Activities of Daily Living      Permission Sought/Granted                  Emotional Assessment       Orientation: : Oriented to Self, Oriented to Place, Oriented to  Time, Oriented to Situation   Psych Involvement: No (comment)  Admission diagnosis:  Hyperkalemia [E87.5] Elevated LFTs [R79.89] Acute renal failure (ARF) (HCC) [N17.9] Acute respiratory failure with hypoxia ([place]) [J96.01] Acute kidney injury superimposed on  chronic kidney disease ([place]) [N17.9, N18.9] Pneumonia due to COVID-19 virus [U07.1, J12.89] Patient Active Problem List   Diagnosis Date Noted  . Elevated LFTs   . Pneumonia due to COVID-19 virus   . Cerebrovascular accident (CVA) ([place])   . Acute kidney injury superimposed on chronic kidney disease ([place]) 01/04/2019  . Acute respiratory failure with hypoxia ([place]) 01/04/2019  . AKI (acute kidney injury) ([place]) 12/27/2018  . COVID-19 virus infection   . Ascending aortic aneurysm ([place]) 10/23/2017  . Arteriovenous fistula for hemodialysis in place, primary ([place]) 08/08/2017  . Cerebral infarction ([place]) 08/08/2017  . Osteoarthritis (arthritis due to wear and tear of joints) 08/08/2017  . Acute renal failure superimposed on stage 4 chronic kidney disease ([place]) 06/04/2017  . GERD (gastroesophageal reflux disease) 06/04/2017  . Pulmonary hypertension, unspecified ([place]) 01/23/2017  . Peripheral edema 01/02/2017  . Acute right ankle pain 12/08/2016  . Fungal intestinal infection following organ transplantation 11/29/2016  . Finger infection, fungal 11/22/2016  . Mass of soft tissue of left upper extremity 10/18/2016  . Scapholunate dissociation of left wrist 10/18/2016  . Digital mucinous cyst of finger of left hand 10/10/2016  . Dyspnea on exertion 08/24/2016  . Swelling of lower extremity 06/30/2016  . Hypertension associated with transplantation 12/03/2014  . URI, acute 03/07/2014  . BK viremia 02/07/2014  . Type 2  diabetes mellitus without complications ([place]) 07/10/1599  . Hypomagnesemia 10/25/2013  . Elevated serum creatinine 10/24/2013  . Immunosuppressive management encounter following kidney transplant 10/21/2013  . Immunosuppression ([place]) 10/14/2013  . Pain at surgical site 10/01/2013  . HLD (hyperlipidemia) 05/02/2013  . OSA (obstructive sleep apnea) 05/02/2013  . History of stroke 03/16/2013  . Movement disorder 03/16/2013  . Lumbar stenosis with neurogenic claudication  12/27/2012  . Chronic back pain 01/27/2011  . HTN (hypertension) 01/27/2011  . History of kidney transplant 01/24/2011  . Hyperkalemia 01/23/2011  . Diabetes mellitus ([place]) 01/23/2011   PCP:  [name], MD Pharmacy:   [facility], [address] [place] [address] Phone: [phone] Fax: [phone]     Social Determinants of Health (SDOH) Interventions    Readmission Risk Interventions Readmission Risk Prevention Plan 12/31/2018  Transportation Screening Complete  PCP or Specialist Appt within 3-5 Days Not Complete  Palliative Care Screening Not Applicable  Medication Review (RN Care Manager) Complete  Some recent data might be hidden

## 2019-01-10 DIAGNOSIS — E7800 Pure hypercholesterolemia, unspecified: Secondary | ICD-10-CM | POA: Diagnosis not present

## 2019-01-10 DIAGNOSIS — I10 Essential (primary) hypertension: Secondary | ICD-10-CM | POA: Diagnosis not present

## 2019-01-10 LAB — CBC WITH DIFFERENTIAL/PLATELET
Abs Immature Granulocytes: 0.11 10*3/uL — ABNORMAL HIGH (ref 0.00–0.07)
Basophils Absolute: 0 10*3/uL (ref 0.0–0.1)
Basophils Relative: 0 %
Eosinophils Absolute: 0 10*3/uL (ref 0.0–0.5)
Eosinophils Relative: 1 %
HCT: 34.2 % — ABNORMAL LOW (ref 39.0–52.0)
Hemoglobin: 11.2 g/dL — ABNORMAL LOW (ref 13.0–17.0)
Immature Granulocytes: 2 %
Lymphocytes Relative: 5 %
Lymphs Abs: 0.3 10*3/uL — ABNORMAL LOW (ref 0.7–4.0)
MCH: 29.9 pg (ref 26.0–34.0)
MCHC: 32.7 g/dL (ref 30.0–36.0)
MCV: 91.4 fL (ref 80.0–100.0)
Monocytes Absolute: 0.4 10*3/uL (ref 0.1–1.0)
Monocytes Relative: 6 %
Neutro Abs: 6 10*3/uL (ref 1.7–7.7)
Neutrophils Relative %: 86 %
Platelets: 174 10*3/uL (ref 150–400)
RBC: 3.74 MIL/uL — ABNORMAL LOW (ref 4.22–5.81)
RDW: 13.5 % (ref 11.5–15.5)
WBC: 6.9 10*3/uL (ref 4.0–10.5)
nRBC: 0 % (ref 0.0–0.2)

## 2019-01-10 LAB — COMPREHENSIVE METABOLIC PANEL
ALT: 37 U/L (ref 0–44)
AST: 19 U/L (ref 15–41)
Albumin: 2.3 g/dL — ABNORMAL LOW (ref 3.5–5.0)
Alkaline Phosphatase: 61 U/L (ref 38–126)
Anion gap: 10 (ref 5–15)
BUN: 81 mg/dL — ABNORMAL HIGH (ref 8–23)
CO2: 18 mmol/L — ABNORMAL LOW (ref 22–32)
Calcium: 8.4 mg/dL — ABNORMAL LOW (ref 8.9–10.3)
Chloride: 112 mmol/L — ABNORMAL HIGH (ref 98–111)
Creatinine, Ser: 2.53 mg/dL — ABNORMAL HIGH (ref 0.61–1.24)
GFR calc Af Amer: 29 mL/min — ABNORMAL LOW (ref >60)
GFR calc non Af Amer: 25 mL/min — ABNORMAL LOW (ref >60)
Glucose, Bld: 180 mg/dL — ABNORMAL HIGH (ref 70–99)
Potassium: 5.4 mmol/L — ABNORMAL HIGH (ref 3.5–5.1)
Sodium: 140 mmol/L (ref 135–145)
Total Bilirubin: 0.5 mg/dL (ref 0.3–1.2)
Total Protein: 5.4 g/dL — ABNORMAL LOW (ref 6.5–8.1)

## 2019-01-10 LAB — C-REACTIVE PROTEIN: CRP: 2 mg/dL — ABNORMAL HIGH (ref <1.0)

## 2019-01-10 LAB — FERRITIN: Ferritin: 435 ng/mL — ABNORMAL HIGH (ref 24–336)

## 2019-01-10 LAB — GLUCOSE, CAPILLARY
Glucose-Capillary: 177 mg/dL — ABNORMAL HIGH (ref 70–99)
Glucose-Capillary: 339 mg/dL — ABNORMAL HIGH (ref 70–99)
Glucose-Capillary: 225 mg/dL — ABNORMAL HIGH (ref 70–99)
Glucose-Capillary: 177 mg/dL — ABNORMAL HIGH (ref 70–99)

## 2019-01-10 LAB — FOLATE RBC
Folate, Hemolysate: >620 ng/mL
Folate, RBC: >1676 ng/mL (ref >498)
Hematocrit: 37 % — ABNORMAL LOW (ref 37.5–51.0)

## 2019-01-10 LAB — D-DIMER, QUANTITATIVE: D-Dimer, Quant: 2.44 ug/mL-FEU — ABNORMAL HIGH (ref 0.00–0.50)

## 2019-01-10 LAB — MAGNESIUM: Magnesium: 2.3 mg/dL (ref 1.7–2.4)

## 2019-01-10 MED ORDER — SODIUM ZIRCONIUM CYCLOSILICATE 10 G PO PACK
10.0000 g | PACK | Freq: Two times a day (BID) | ORAL | Status: DC
  Administered 2019-01-10 – 2019-01-11 (×3): 10 g via ORAL
  Filled 2019-01-10 (×3): qty 1

## 2019-01-10 NOTE — Progress Notes (Signed)
  [facility] Progress Note    Assessment/ Plan:   1. CKD3T and some mild AKI, sp KT 2015 on Tac/MMF, followed by [place] at [facility] baseline SCr 2 to 2.5--> at baseline now 2. AKI, labile SCr but appears improved 3. COVID 19, per TRH, on decadron and remdesiver 4. HTN/Vol: BPs stable 5. Hyperkalemia: mild, restarted Lokelma 6. NAG Met acidosis on NaHCo3 ,stable  Subjective:    Feeling better.  K still a little high- Lokelma restarted.  Appetite better but not at baseline   Objective:   BP 122/70   Pulse 60   Temp 97.8 F (36.6 C) (Oral)   Resp 11   Wt 100.2 kg   SpO2 96%   BMI 30.81 kg/m   Intake/Output Summary (Last 24 hours) at 01/10/2019 1101 Last data filed at 01/10/2019 0946 Gross per 24 hour  Intake 150 ml  Output 650 ml  Net -500 ml   Weight change:   Physical Exam: Gen: NAD CVS: RRR Resp: coarse bilateral, wearing minimal O2 Abd: obese, graft in RLQ no issues Ext: no LE edema  Imaging: No results found.  Labs: BMET Recent Labs  Lab 01/04/19 0800 01/06/19 0426 01/07/19 0500 01/08/19 0348 01/08/19 1417 01/09/19 0332 01/10/19 0514  NA 146* 144 145 142 141 141 140  K 5.9* 5.4* 5.0 5.5* 5.5* 5.0 5.4*  CL 120* 119* 113* 114* 112* 110 112*  CO2 15* 15* 17* 17* 14* 21* 18*  GLUCOSE 167* 255* 197* 251* 249* 201* 180*  BUN 70* 71* 83* 94* 96* 84* 81*  CREATININE 2.66* 2.35* 2.68* 2.90* 2.79* 2.45* 2.53*  CALCIUM 8.6* 8.9 8.8* 8.6* 8.7* 8.7* 8.4*  PHOS  --   --   --   --  4.3  --   --    CBC Recent Labs  Lab 01/07/19 0500 01/08/19 0348 01/09/19 0332 01/10/19 0514  WBC 8.0 7.0 7.1 6.9  NEUTROABS 7.2 6.2 6.3 6.0  HGB 12.2* 12.4* 11.9* 11.2*  HCT 38.1* 38.5* 36.3* 34.2*  MCV 90.9 93.7 90.8 91.4  PLT 202 214 192 174    Medications:    . aspirin EC  81 mg Oral Daily  . atorvastatin  40 mg Oral Daily  . calcitRIOL  0.25 mcg Oral See admin instructions  . dexamethasone (DECADRON) injection  6 mg Intravenous Q24H  .  gabapentin  300 mg Oral BID  . heparin  5,000 Units Subcutaneous Q8H  . hydrALAZINE  25 mg Oral Q8H  . HYDROcodone-acetaminophen  1-2 tablet Oral Q6H  . insulin aspart  0-9 Units Subcutaneous TID WC  . insulin aspart  4 Units Subcutaneous TID WC  . insulin glargine  30 Units Subcutaneous QHS  . labetalol  100 mg Oral BID  . linagliptin  5 mg Oral Daily  . methimazole  2.5 mg Oral Daily  . multivitamin with minerals  1 tablet Oral Daily  . mycophenolate  180 mg Oral BID  . sodium bicarbonate  1,300 mg Oral BID  . sodium zirconium cyclosilicate  10 g Oral BID  . tacrolimus  2 mg Oral BID  . tamsulosin  0.4 mg Oral QHS      Madelon Lips, MD [place] pgr [phone] 01/10/2019, 11:01 AM

## 2019-01-10 NOTE — Progress Notes (Signed)
SATURATION QUALIFICATIONS: (This note is used to comply with regulatory documentation for home oxygen)  Patient Saturations on Room Air at Rest = 97%  Patient Saturations on Room Air while Ambulating = 94%  Patient Saturations on 0 Liters of oxygen while Ambulating = 94%  Please briefly explain why patient needs home oxygen: 

## 2019-01-10 NOTE — Progress Notes (Signed)
PROGRESS NOTE    REN ASPINALL  FUX:[id number] DOB: [birth year] DOA: 01/03/2019 PCP: [name], MD   Brief Narrative:  [name] a 68 y.o.malewithhistory of renal transplant stage IV kidney disease, diabetes mellitus type 2, hypertension and history of stroke who was  discharged 3 days prior to the being admitted for Covid 19 infection with diarrhea was empirically treated with IV fluids at that time patient's Lasix and Bactrim were withheld and discharged home after patient's creatinine improved and diarrhea stopped. Patient after reaching home became more short of breath and since it was persistent patient came back to the ER but denies any chest pain or fever chills. No productive cough.  ED Course:Chest x-ray in the ER shows interstitial changes which could be either from Covid infection or fluid overload. Labs show creatinine of 2.9 which is increased from 2 about 4 days ago potassium 5.7 blood glucose 166 AST and ALT also has increased. LDH 328 ferritin 1247 and CRP 16.5 procalcitonin 0.18 BNP 59. Hemoglobin slightly decreased from baseline is around 11.8 WBC count 6.1 EKG shows normal sinus rhythm. Given the acute respiratory failure with hypoxia, chest x-ray showed infiltrates with normal BNP likely by Covid pneumonia.  Patient was started on IV Decadron and remdesivir and admitted for further management.   Assessment & Plan:   Principal Problem:   Acute respiratory failure with hypoxia (HCC) Active Problems:   Pulmonary hypertension, unspecified (HCC)   Type 2 diabetes mellitus without complications ([place])   AKI (acute kidney injury) ([place])   COVID-19 virus infection   Acute kidney injury superimposed on chronic kidney disease (HCC)   Elevated LFTs   Pneumonia due to COVID-19 virus   Cerebrovascular accident (CVA) ([place])  1 acute respiratory failure with hypoxia secondary to COVID-19 infection and concern for possible volume overload Patient noted to  have presented with worsening shortness of breath, noted to be hypoxic, chest x-ray with bilateral interstitial markings.  Patient recently discharged 3 days prior to admission after being admitted for COVID-19 infection which presented as diarrhea at which time patient had a normal BNP.  Patient currently afebrile.  Hypoxia improved. Due to patient's underlying renal transplant patient at high risk for worsening Covid infection.  Patient states slowly improving with his shortness of breath.  Patient had sats of 95-96% on room air.  Sats greater than 90% on room air per OT.  Inflammatory markers trending down.  S/p 5 days IV remdesivir, continue IV Decadron.  Diuretics on hold and likely resume when okay with nephrology.  Will likely need a steroid taper on discharge.  Continue supportive care.  Follow.   2.  Transaminitis Could likely be secondary to Covid infection.  Abdominal ultrasound with a coarse hepatic echotexture.  Patient currently on methimazole and statins.  LFTs trending down.  Follow.  3.  Diabetes mellitus type 2 with hyperglycemia Hemoglobin A1c 7.8 on 12/28/2018.  CBG of 177 this morning.  Elevated CBGs likely exacerbated with steroids.  Increased Lantus to 30 units daily.  Continue meal coverage NovoLog 4 units 3 times daily with meals, sliding scale insulin.  Continue current dose of gabapentin.   4.  Hypertension Blood pressure now somewhat borderline.  Hydralazine held this morning.  Continue labetalol.  Will discontinue hydralazine for now and resume if needed for better blood pressure control.  Follow.   5.  Acute kidney injury on chronic kidney disease stage III status post kidney transplant 2015 Renal function improving and close to  baseline.  Nephrology consulted and are following.  Continue tacrolimus, mycophenolate, steroids.  Appreciate nephrology input and recommendations.  6.  Non-anion gap metabolic acidosis Likely secondary to chronic kidney disease.  Acidosis  improving on bicarb tablets which we will continue for now. Per nephrology.    7.  Hyperkalemia Initially improved after Lokelma.  Potassium at 5.5 the morning of 01/08/2019.  Patient started on Lokelma and diet change to a renal diet.  Potassium at 5.4 this morning.  Lasix on hold.  Continue Lokelma.  Repeat labs in the morning.  Per nephrology, once patient's diuretics are resumed could likely hold Lokelma at that time.  Per nephrology.   8.  History of benign follicular nodule status post thyroid surgery 10/13/2015 /?hyperthyroidism Continue methimazole.  Outpatient follow-up.   9.  History of cryptogenic CVA Continue aspirin for secondary stroke prophylaxis.  Continue statin.  PT/OT.  Patient will likely need home health therapies which have been ordered..     DVT prophylaxis: Heparin Code Status: Full Family Communication: Updated patient.   Disposition Plan: Likely home when clinically improved, improvement with renal function and improvement with hyperkalemia and when cleared by nephrology hopefully tomorrow..   Consultants:   Nephrology: Dr. Moshe Cipro 01/05/2019  Procedures:   Chest x-ray 01/04/2019  CT head 01/07/2019  Antimicrobials:   IV remdesivir 01/04/2019>>>>> 01/08/2019   Subjective: Patient laying in bed flat.  Denies any chest pain.  No shortness of breath.  Feeling better.  Hopes to be able to be discharged home soon.   Objective: Vitals:   01/10/19 0400 01/10/19 0543 01/10/19 0600 01/10/19 0700  BP: 119/61 (!) 99/52 117/68 122/70  Pulse: 64 64 (!) 155 60  Resp: 11 16 11 11   Temp:  (!) 96.1 F (35.6 C) 97.8 F (36.6 C)   TempSrc:  Axillary Oral   SpO2: 94% 93% 98% 96%  Weight:        Intake/Output Summary (Last 24 hours) at 01/10/2019 1054 Last data filed at 01/10/2019 0946 Gross per 24 hour  Intake 150 ml  Output 1150 ml  Net -1000 ml   Filed Weights   01/07/19 0500  Weight: 100.2 kg    Examination:  General exam: NAD.  Respiratory  system: Lungs clear to auscultation bilaterally.  No wheezes, no crackles, no rhonchi.  Normal respiratory effort. Cardiovascular system: RRR no murmurs rubs or gallops.  No JVD.  No lower extremity edema.  Gastrointestinal system: Abdomen is nontender, nondistended, soft, positive bowel sounds.  No rebound.  No guarding. Central nervous system: Alert and oriented. No focal neurological deficits. Extremities: Symmetric 5 x 5 power. Skin: No rashes, lesions or ulcers Psychiatry: Judgement and insight appear fair. Mood & affect appropriate.     Data Reviewed: I have personally reviewed following labs and imaging studies  CBC: Recent Labs  Lab 01/06/19 0426 01/07/19 0500 01/08/19 0348 01/09/19 0332 01/10/19 0514  WBC 7.2 8.0 7.0 7.1 6.9  NEUTROABS 6.5 7.2 6.2 6.3 6.0  HGB 11.9* 12.2* 12.4* 11.9* 11.2*  HCT 37.1* 38.1* 38.5* 36.3* 34.2*  MCV 92.3 90.9 93.7 90.8 91.4  PLT 202 202 214 192 426   Basic Metabolic Panel: Recent Labs  Lab 01/06/19 0426 01/07/19 0500 01/08/19 0348 01/08/19 1417 01/09/19 0332 01/10/19 0514  NA 144 145 142 141 141 140  K 5.4* 5.0 5.5* 5.5* 5.0 5.4*  CL 119* 113* 114* 112* 110 112*  CO2 15* 17* 17* 14* 21* 18*  GLUCOSE 255* 197* 251* 249* 201* 180*  BUN  71* 83* 94* 96* 84* 81*  CREATININE 2.35* 2.68* 2.90* 2.79* 2.45* 2.53*  CALCIUM 8.9 8.8* 8.6* 8.7* 8.7* 8.4*  MG 2.2 2.2 2.3  --  2.2 2.3  PHOS  --   --   --  4.3  --   --    GFR: Estimated Creatinine Clearance: 34.2 mL/min (A) (by C-G formula based on SCr of 2.53 mg/dL (H)). Liver Function Tests: Recent Labs  Lab 01/06/19 0426 01/07/19 0500 01/08/19 0348 01/08/19 1417 01/09/19 0332 01/10/19 0514  AST 25 21 28   --  18 19  ALT 74* 62* 49*  --  45* 37  ALKPHOS 64 65 61  --  69 61  BILITOT 0.1* 0.3 1.0  --  0.3 0.5  PROT 6.2* 6.1* 5.8*  --  5.6* 5.4*  ALBUMIN 2.5* 2.5* 2.5* 2.4* 2.4* 2.3*   No results for input(s): LIPASE, AMYLASE in the last 168 hours. No results for input(s): AMMONIA  in the last 168 hours. Coagulation Profile: No results for input(s): INR, PROTIME in the last 168 hours. Cardiac Enzymes: No results for input(s): CKTOTAL, CKMB, CKMBINDEX, TROPONINI in the last 168 hours. BNP (last 3 results) No results for input(s): PROBNP in the last 8760 hours. HbA1C: No results for input(s): HGBA1C in the last 72 hours. CBG: Recent Labs  Lab 01/09/19 0807 01/09/19 1251 01/09/19 1701 01/09/19 2122 01/10/19 0736  GLUCAP 221* 338* 331* 250* 177*   Lipid Profile: No results for input(s): CHOL, HDL, LDLCALC, TRIG, CHOLHDL, LDLDIRECT in the last 72 hours. Thyroid Function Tests: No results for input(s): TSH, T4TOTAL, FREET4, T3FREE, THYROIDAB in the last 72 hours. Anemia Panel: Recent Labs    01/08/19 0348 01/09/19 0332 01/10/19 0514  VITAMINB12 5,216*  --   --   FERRITIN 521* 419* 435*   Sepsis Labs: Recent Labs  Lab 01/04/19 0011  PROCALCITON 0.18  LATICACIDVEN 1.1    Recent Results (from the past 240 hour(s))  Blood Culture (routine x 2)     Status: None   Collection Time: 01/03/19 11:55 PM   Specimen: BLOOD  Result Value Ref Range Status   Specimen Description BLOOD RIGHT UPPER ARM  Final   Special Requests   Final    BOTTLES DRAWN AEROBIC AND ANAEROBIC Blood Culture results may not be optimal due to an inadequate volume of blood received in culture bottles   Culture   Final    NO GROWTH 5 DAYS Performed at [facility], [place] [address], [place], [place] [address]    Report Status 01/09/2019 FINAL  Final         Radiology Studies: No results found.      Scheduled Meds: . aspirin EC  81 mg Oral Daily  . atorvastatin  40 mg Oral Daily  . calcitRIOL  0.25 mcg Oral See admin instructions  . dexamethasone (DECADRON) injection  6 mg Intravenous Q24H  . gabapentin  300 mg Oral BID  . heparin  5,000 Units Subcutaneous Q8H  . hydrALAZINE  25 mg Oral Q8H  . HYDROcodone-acetaminophen  1-2 tablet Oral Q6H  . insulin aspart  0-9  Units Subcutaneous TID WC  . insulin aspart  4 Units Subcutaneous TID WC  . insulin glargine  30 Units Subcutaneous QHS  . labetalol  100 mg Oral BID  . linagliptin  5 mg Oral Daily  . methimazole  2.5 mg Oral Daily  . multivitamin with minerals  1 tablet Oral Daily  . mycophenolate  180 mg Oral  BID  . sodium bicarbonate  1,300 mg Oral BID  . sodium zirconium cyclosilicate  10 g Oral BID  . tacrolimus  2 mg Oral BID  . tamsulosin  0.4 mg Oral QHS   Continuous Infusions:    LOS: 6 days    Time spent: 40 minutes    Irine Seal, MD [facility]  If 7PM-7AM, please contact night-coverage [email or web address] 01/10/2019, 10:54 AM

## 2019-01-10 NOTE — Progress Notes (Signed)
Occupational Therapy Treatment Patient Details Name: Jon [name] MRN: [id number] DOB: 01-21-1950 Today's Date: 01/10/2019    History of present illness 68 y.o. male with history of renal transplant stage IV kidney disease now, diabetes mellitus type 2, hypertension and history of stroke who was just discharged 12/22 after being admitted for Covid 19 infection with diarrhea. Returned to ED 12/25 with SoB where Chest x-ray in the ER shows interstitial changes which could be either from Covid infection or fluid overload admitted 12/25 for treatment of acute respiratory failure with hypoxia secondary to COVID.   OT comments  Pt making steady progress towards OT goals this session. Session focus on functional mobility  and energy conservation strategies to incorporate into ADL routine. Pt on RA throughout session with O2 >90%. Pt able to complete functional mobility greater than a household distance but fatigues quickly with movement. Education provided on ECS to incorporate into ADL routine at home. Pt anxious to DC, will continue to follow acutely.    Follow Up Recommendations  Home health OT;Supervision/Assistance - 24 hour    Equipment Recommendations  3 in 1 bedside commode    Recommendations for Other Services      Precautions / Restrictions Precautions Precautions: Fall Precaution Comments: reports he has had near falls Restrictions Weight Bearing Restrictions: No       Mobility Bed Mobility Overal bed mobility: Needs Assistance Bed Mobility: Supine to Sit     Supine to sit: HOB elevated;Modified independent (Device/Increase time)     General bed mobility comments: no physical assist needed  Transfers Overall transfer level: Needs assistance Equipment used: Rolling walker (2 wheeled) Transfers: Sit to/from Stand Sit to Stand: Min guard;From elevated surface         General transfer comment: min guard for safety from elevated HOMB    Balance Overall balance  assessment: Needs assistance Sitting-balance support: Feet supported;No upper extremity supported Sitting balance-Leahy Scale: Fair     Standing balance support: Bilateral upper extremity supported Standing balance-Leahy Scale: Poor                             ADL either performed or assessed with clinical judgement   ADL Overall ADL's : Needs assistance/impaired     Grooming: Min guard;Standing Grooming Details (indicate cue type and reason): min guard for standing grooming tasks             Lower Body Dressing: Supervision/safety;Sitting/lateral leans;Set up Lower Body Dressing Details (indicate cue type and reason): pt reports using reacher to home to don socks, able to reach feet from EOB Toilet Transfer: Min guard;RW;Ambulation;Minimal assistance Toilet Transfer Details (indicate cue type and reason): simulated via functional mobility with RW, min guard- mIN A as pt fatigues         Functional mobility during ADLs: Minimal assistance;Min guard;Rolling walker General ADL Comments: session focus on ECS for BADLs at home and functional mobility. pt limited by decreased activity tolerance and cognitive deficits as pt continues to be disoriented to date     Vision Patient Visual Report: No change from baseline     Perception     Praxis      Cognition Arousal/Alertness: Awake/alert Behavior During Therapy: WFL for tasks assessed/performed Overall Cognitive Status: Impaired/Different from baseline Area of Impairment: Orientation;Problem solving                 Orientation Level: Disoriented to;Time  Problem Solving: Slow processing;Requires verbal cues General Comments: pt states that it is th 28th of December, provided cue that today was new years eve with pt unable to give date        Exercises     Shoulder Instructions       General Comments pr on RA with O2 WNL.  BP at start of session 66/68 HR 76 O2 94. Pt asking  questions about [place] therapy. Provided education on [facility] goals and frequency etc.     Pertinent Vitals/ Pain       Pain Assessment: No/denies pain  Home Living                                          Prior Functioning/Environment              Frequency  Min 2X/week        Progress Toward Goals  OT Goals(current goals can now be found in the care plan section)  Progress towards OT goals: Progressing toward goals  Acute Rehab OT Goals Patient Stated Goal: go home OT Goal Formulation: With patient Time For Goal Achievement: 01/21/19 Potential to Achieve Goals: Good  Plan Discharge plan remains appropriate    Co-evaluation                 AM-PAC OT "6 Clicks" Daily Activity     Outcome Measure   Help from another person eating meals?: A Little Help from another person taking care of personal grooming?: A Little Help from another person toileting, which includes using toliet, bedpan, or urinal?: A Little Help from another person bathing (including washing, rinsing, drying)?: A Lot Help from another person to put on and taking off regular upper body clothing?: A Little Help from another person to put on and taking off regular lower body clothing?: A Lot 6 Click Score: 16    End of Session Equipment Utilized During Treatment: Gait belt;Rolling walker  OT Visit Diagnosis: Unsteadiness on feet (R26.81);Other abnormalities of gait and mobility (R26.89);Muscle weakness (generalized) (M62.81);Other symptoms and signs involving cognitive function   Activity Tolerance Patient tolerated treatment well   Patient Left in bed;with call bell/phone within reach   Nurse Communication Mobility status;Other (comment)(brought pt Sprite)        Time: 3664-4034 OT Time Calculation (min): 21 min  Charges: OT General Charges $OT Visit: 1 Visit OT Treatments $Self Care/Home Management : 8-22 mins  Lanier Clam., COTA/L Acute Rehabilitation  Services [phone]    Ihor Gully 01/10/2019, 3:54 PM

## 2019-01-11 DIAGNOSIS — Z794 Long term (current) use of insulin: Secondary | ICD-10-CM

## 2019-01-11 DIAGNOSIS — J189 Pneumonia, unspecified organism: Secondary | ICD-10-CM

## 2019-01-11 HISTORY — DX: Pneumonia, unspecified organism: J18.9

## 2019-01-11 LAB — CBC
HCT: 33.8 % — ABNORMAL LOW (ref 39.0–52.0)
Hemoglobin: 11 g/dL — ABNORMAL LOW (ref 13.0–17.0)
MCH: 29.6 pg (ref 26.0–34.0)
MCHC: 32.5 g/dL (ref 30.0–36.0)
MCV: 91.1 fL (ref 80.0–100.0)
Platelets: 181 10*3/uL (ref 150–400)
RBC: 3.71 MIL/uL — ABNORMAL LOW (ref 4.22–5.81)
RDW: 13.4 % (ref 11.5–15.5)
WBC: 8.3 10*3/uL (ref 4.0–10.5)
nRBC: 0 % (ref 0.0–0.2)

## 2019-01-11 LAB — RENAL FUNCTION PANEL
Albumin: 2.3 g/dL — ABNORMAL LOW (ref 3.5–5.0)
Anion gap: 8 (ref 5–15)
BUN: 71 mg/dL — ABNORMAL HIGH (ref 8–23)
CO2: 23 mmol/L (ref 22–32)
Calcium: 8.4 mg/dL — ABNORMAL LOW (ref 8.9–10.3)
Chloride: 109 mmol/L (ref 98–111)
Creatinine, Ser: 2.06 mg/dL — ABNORMAL HIGH (ref 0.61–1.24)
GFR calc Af Amer: 38 mL/min — ABNORMAL LOW (ref >60)
GFR calc non Af Amer: 32 mL/min — ABNORMAL LOW (ref >60)
Glucose, Bld: 140 mg/dL — ABNORMAL HIGH (ref 70–99)
Phosphorus: 3.2 mg/dL (ref 2.5–4.6)
Potassium: 4.9 mmol/L (ref 3.5–5.1)
Sodium: 140 mmol/L (ref 135–145)

## 2019-01-11 LAB — GLUCOSE, CAPILLARY
Glucose-Capillary: 147 mg/dL — ABNORMAL HIGH (ref 70–99)
Glucose-Capillary: 252 mg/dL — ABNORMAL HIGH (ref 70–99)

## 2019-01-11 LAB — D-DIMER, QUANTITATIVE: D-Dimer, Quant: 2.25 ug/mL-FEU — ABNORMAL HIGH (ref 0.00–0.50)

## 2019-01-11 LAB — C-REACTIVE PROTEIN: CRP: 1.1 mg/dL — ABNORMAL HIGH (ref <1.0)

## 2019-01-11 LAB — FERRITIN: Ferritin: 458 ng/mL — ABNORMAL HIGH (ref 24–336)

## 2019-01-11 MED ORDER — PANTOPRAZOLE SODIUM 40 MG PO TBEC: 40.0000 mg | DELAYED_RELEASE_TABLET | Freq: Every day | ORAL | 0 refills | Status: DC

## 2019-01-11 MED ORDER — PREDNISONE 5 MG PO TABS: 5.0000 mg | ORAL_TABLET | Freq: Every day | ORAL | Status: AC

## 2019-01-11 MED ORDER — PREDNISONE 10 MG PO TABS: 10.0000 mg | ORAL_TABLET | Freq: Every day | ORAL | 0 refills | Status: DC

## 2019-01-11 MED ORDER — FUROSEMIDE 40 MG PO TABS: 40.0000 mg | ORAL_TABLET | Freq: Every day | ORAL | 1 refills | Status: DC

## 2019-01-11 NOTE — Discharge Summary (Signed)
Physician Discharge Summary  Jon [name] DGL:[id number] DOB: 06-Aug-1950 DOA: 01/03/2019  PCP: [name], MD  Admit date: 01/03/2019 Discharge date: 01/11/2019  Time spent: 60 minutes  Recommendations for Outpatient Follow-up:  1. Follow-up with [name], MD in 2 weeks.  On follow-up patient will need a comprehensive metabolic profile done to follow-up on electrolytes, LFTs, renal function.  Patient's diabetes will also need to be reassessed at that time. 2. Follow-up with Dr. Moshe Cipro, [facility]/nephrology in 1 week.  On follow-up patient will need renal function panel done to follow-up on electrolytes and renal function.   Discharge Diagnoses:  Principal Problem:   Acute respiratory failure with hypoxia (HCC) Active Problems:   Pulmonary hypertension, unspecified (HCC)   Type 2 diabetes mellitus without complications ([place])   AKI (acute kidney injury) ([place])   COVID-19 virus infection   Acute kidney injury superimposed on chronic kidney disease (HCC)   Elevated LFTs   Pneumonia due to COVID-19 virus   Cerebrovascular accident (CVA) ([place])   Discharge Condition: Stable and improved  Diet recommendation: Carb modified diet  Filed Weights   01/07/19 0500  Weight: 100.2 kg    History of present illness:  Per Dr.  Lois [name] is a 69 y.o. male with history of renal transplant stage IV kidney disease now, diabetes mellitus type 2, hypertension and history of stroke who was just discharged 3 days prior to admission, after being admitted for Covid 19 infection with diarrhea was empirically treated with IV fluids at that time patient's Lasix and Bactrim were withheld and discharged home after patient's creatinine improved and diarrhea stopped.  Patient after reaching home became more short of breath and since it was persistent patient came back to the ER but denies any chest pain or fever chills.  No productive cough.  ED  Course: Chest x-ray in the ER showed interstitial changes which could be either from Covid infection or fluid overload.  Labs showed creatinine of 2.9 which is increased from 2 about 4 days ago potassium 5.7 blood glucose 166 AST and ALT also has increased.  LDH 328 ferritin 1247 and CRP 16.5 procalcitonin 0.18 BNP 59.  Hemoglobin slightly decreased from baseline is around 11.8 WBC count 6.1 EKG shows normal sinus rhythm.  Given the acute respiratory failure with hypoxia with the chest x-ray showed infiltrates with normal BNP likely causes Covid pneumonia for which I have started patient on IV Decadron and remdesivir admitted for further management.  Hospital Course:  1 acute respiratory failure with hypoxia secondary to COVID-19 infection and concern for possible volume overload Patient noted to have presented with worsening shortness of breath, noted to be hypoxic, chest x-ray with bilateral interstitial markings.  Patient recently discharged 3 days prior to admission after being admitted for COVID-19 infection which presented as diarrhea at which time patient had a normal BNP.  Patient remained afebrile during the hospitalization.  Due to patient's underlying renal transplant patient at high risk for worsening Covid infection.    Inflammatory markers were obtained and were elevated on presentation and trended down with treatment.  Patient was placed on IV Decadron as well as IV remdesivir.  Patient completed a 5-day course of IV remdesivir.  Patient improved clinically with improvement with his hypoxia such that by day of discharge patient sats were 94 -95% on room air.  Patient was discharged home on a steroid taper back to his home regimen of prednisone 5 mg daily.  Patient is Lasix was held during the hospitalization and will be resumed on discharge.  Patient improved clinically and patient was close to baseline by day of discharge.  Outpatient follow-up with PCP.    2.  Transaminitis Could likely be  secondary to Covid infection.  Abdominal ultrasound with a coarse hepatic echotexture.  Patient currently on methimazole and statins.  LFTs trended down.  Follow.  3.  Diabetes mellitus type 2 with hyperglycemia Hemoglobin A1c 7.8 on 12/28/2018.    Patient's Lantus was adjusted during the hospitalization as patient was noted to have elevated blood glucose levels likely secondary to steroids.  Patient also maintained on meal coverage insulin as well as sliding scale insulin.  Outpatient follow-up.   4.  Hypertension Blood pressure initially was elevated and as such hydralazine was added to patient's regimen of labetalol.  Patient's blood pressure improved.  Patient blood pressure subsequently became borderline and as such hydralazine was discontinued and patient maintained on his home regimen of labetalol.  Patient's blood pressure had normalized by day of discharge.  Outpatient follow-up.    5.  Acute kidney injury on chronic kidney disease stage III status post kidney transplant 2015 Renal function improved during the hospitalization with holding of patient's diuretics and wear at baseline by day of discharge.  Patient was maintained on home regimen of tacrolimus, mycophenolate, steroids.  Patient's diuretics were held during the hospitalization and be resumed on discharge.  Nephrology was consulted and followed the patient throughout the hospitalization.   6.  Non-anion gap metabolic acidosis Likely secondary to chronic kidney disease.  Acidosis improved on bicarb tablets.  Bicarb tablets were discontinued on discharge.  Outpatient follow-up with nephrology.   7.  Hyperkalemia Initially improved after Lokelma.  Potassium at 5.5 the morning of 01/08/2019.  Patient started on Lokelma and diet change to a renal diet.    Patient's Lasix were held.  Patient was maintained on [place] with resolution of hyperkalemia such that by day of discharge potassium was down to 4.9.  Patient was followed by  nephrology throughout the hospitalization and will follow up with nephrology in the outpatient setting.  Patient will likely need labs drawn in 1 week to follow-up on electrolytes and renal function.   8.  History of benign follicular nodule status post thyroid surgery 10/13/2015 /?hyperthyroidism Patient maintained on home regimen of methimazole.  Outpatient follow-up.   9.  History of cryptogenic CVA Patient was maintained on aspirin for secondary stroke prophylaxis.    Patient also maintained on a statin.  Patient was seen by PT/OT who recommended home health therapies which were ordered.    Procedures:  Chest x-ray 01/04/2019  CT head 01/07/2019  Consultations:  Nephrology: Dr. Moshe Cipro 01/05/2019   Discharge Exam: Vitals:   01/11/19 0439 01/11/19 0518  BP: 134/84 140/72  Pulse: 67 60  Resp: 10 16  Temp:  (!) 97.4 F (36.3 C)  SpO2: 92% 92%    General: NAD Cardiovascular: RRR Respiratory: CTAB  Discharge Instructions   Discharge Instructions    Diet Carb Modified   Complete by: As directed    Discharge instructions   Complete by: As directed    ?   Person Under Monitoring Name: Jon [name]  Location: [name] [place] [address]   Infection Prevention Recommendations for Individuals Confirmed to have, or Being Evaluated for, 2019 Novel Coronavirus (COVID-19) Infection Who Receive Care at Home  Individuals who are confirmed to have, or are being evaluated for, COVID-19  should follow the prevention steps below until a healthcare provider or local or state health department says they can return to normal activities.  Stay home except to get medical care You should restrict activities outside your home, except for getting medical care. Do not go to work, school, or public areas, and do not use public transportation or taxis.  Call ahead before visiting your doctor Before your medical appointment, call the healthcare provider and  tell them that you have, or are being evaluated for, COVID-19 infection. This will help the healthcare provider's office take steps to keep other people from getting infected. Ask your healthcare provider to call the local or state health department.  Monitor your symptoms Seek prompt medical attention if your illness is worsening (e.g., difficulty breathing). Before going to your medical appointment, call the healthcare provider and tell them that you have, or are being evaluated for, COVID-19 infection. Ask your healthcare provider to call the local or state health department.  Wear a facemask You should wear a facemask that covers your nose and mouth when you are in the same room with other people and when you visit a healthcare provider. People who live with or visit you should also wear a facemask while they are in the same room with you.  Separate yourself from other people in your home As much as possible, you should stay in a different room from other people in your home. Also, you should use a separate bathroom, if available.  Avoid sharing household items You should not share dishes, drinking glasses, cups, eating utensils, towels, bedding, or other items with other people in your home. After using these items, you should wash them thoroughly with soap and water.  Cover your coughs and sneezes Cover your mouth and nose with a tissue when you cough or sneeze, or you can cough or sneeze into your sleeve. Throw used tissues in a lined trash can, and immediately wash your hands with soap and water for at least 20 seconds or use an alcohol-based hand rub.  Wash your [facility] your hands often and thoroughly with soap and water for at least 20 seconds. You can use an alcohol-based hand sanitizer if soap and water are not available and if your hands are not visibly dirty. Avoid touching your eyes, nose, and mouth with unwashed hands.   Prevention Steps for Caregivers and  Household Members of Individuals Confirmed to have, or Being Evaluated for, COVID-19 Infection Being Cared for in the Home  If you live with, or provide care at home for, a person confirmed to have, or being evaluated for, COVID-19 infection please follow these guidelines to prevent infection:  Follow healthcare provider's instructions Make sure that you understand and can help the patient follow any healthcare provider instructions for all care.  Provide for the patient's basic needs You should help the patient with basic needs in the home and provide support for getting groceries, prescriptions, and other personal needs.  Monitor the patient's symptoms If they are getting sicker, call his or her medical provider and tell them that the patient has, or is being evaluated for, COVID-19 infection. This will help the healthcare provider's office take steps to keep other people from getting infected. Ask the healthcare provider to call the local or state health department.  Limit the number of people who have contact with the patient If possible, have only one caregiver for the patient. Other household members should stay in another home or place of  residence. If this is not possible, they should stay in another room, or be separated from the patient as much as possible. Use a separate bathroom, if available. Restrict visitors who do not have an essential need to be in the home.  Keep older adults, very young children, and other sick people away from the patient Keep older adults, very young children, and those who have compromised immune systems or chronic health conditions away from the patient. This includes people with chronic heart, lung, or kidney conditions, diabetes, and cancer.  Ensure good ventilation Make sure that shared spaces in the home have good air flow, such as from an air conditioner or an opened window, weather permitting.  Wash your hands often Wash your hands often  and thoroughly with soap and water for at least 20 seconds. You can use an alcohol based hand sanitizer if soap and water are not available and if your hands are not visibly dirty. Avoid touching your eyes, nose, and mouth with unwashed hands. Use disposable paper towels to dry your hands. If not available, use dedicated cloth towels and replace them when they become wet.  Wear a facemask and gloves Wear a disposable facemask at all times in the room and gloves when you touch or have contact with the patient's blood, body fluids, and/or secretions or excretions, such as sweat, saliva, sputum, nasal mucus, vomit, urine, or feces.  Ensure the mask fits over your nose and mouth tightly, and do not touch it during use. Throw out disposable facemasks and gloves after using them. Do not reuse. Wash your hands immediately after removing your facemask and gloves. If your personal clothing becomes contaminated, carefully remove clothing and launder. Wash your hands after handling contaminated clothing. Place all used disposable facemasks, gloves, and other waste in a lined container before disposing them with other household waste. Remove gloves and wash your hands immediately after handling these items.  Do not share dishes, glasses, or other household items with the patient Avoid sharing household items. You should not share dishes, drinking glasses, cups, eating utensils, towels, bedding, or other items with a patient who is confirmed to have, or being evaluated for, COVID-19 infection. After the person uses these items, you should wash them thoroughly with soap and water.  Wash laundry thoroughly Immediately remove and wash clothes or bedding that have blood, body fluids, and/or secretions or excretions, such as sweat, saliva, sputum, nasal mucus, vomit, urine, or feces, on them. Wear gloves when handling laundry from the patient. Read and follow directions on labels of laundry or clothing items and  detergent. In general, wash and dry with the warmest temperatures recommended on the label.  Clean all areas the individual has used often Clean all touchable surfaces, such as counters, tabletops, doorknobs, bathroom fixtures, toilets, phones, keyboards, tablets, and bedside tables, every day. Also, clean any surfaces that may have blood, body fluids, and/or secretions or excretions on them. Wear gloves when cleaning surfaces the patient has come in contact with. Use a diluted bleach solution (e.g., dilute bleach with 1 part bleach and 10 parts water) or a household disinfectant with a label that says EPA-registered for coronaviruses. To make a bleach solution at home, add 1 tablespoon of bleach to 1 quart (4 cups) of water. For a larger supply, add  cup of bleach to 1 gallon (16 cups) of water. Read labels of cleaning products and follow recommendations provided on product labels. Labels contain instructions for safe and effective use of the  cleaning product including precautions you should take when applying the product, such as wearing gloves or eye protection and making sure you have good ventilation during use of the product. Remove gloves and wash hands immediately after cleaning.  Monitor yourself for signs and symptoms of illness Caregivers and household members are considered close contacts, should monitor their health, and will be asked to limit movement outside of the home to the extent possible. Follow the monitoring steps for close contacts listed on the symptom monitoring form.   ? If you have additional questions, contact your local health department or call the epidemiologist on call at [phone] (available 24/7). ? This guidance is subject to change. For the most up-to-date guidance from [facility], please refer to their website: [email or web address].pl   Increase activity slowly   Complete by: As directed      Allergies as of  01/11/2019      Reactions   Lisinopril Other (See Comments)   Increased potassium level   Meloxicam Nausea And Vomiting   Victoza [liraglutide] Diarrhea, Nausea And Vomiting, Swelling      Medication List    TAKE these medications   aspirin EC 81 MG tablet Take 81 mg by mouth daily.   atorvastatin 20 MG tablet Commonly known as: LIPITOR Take 40 mg by mouth at bedtime.   calcitRIOL 0.25 MCG capsule Commonly known as: ROCALTROL Take 0.25 mcg by mouth See admin instructions. Take 1 capsule (0.25 mcg) by mouth five times weekly - Monday thru Friday   furosemide 40 MG tablet Commonly known as: Lasix Take 1 tablet (40 mg total) by mouth daily.   gabapentin 300 MG capsule Commonly known as: NEURONTIN Take 300 mg by mouth 2 (two) times daily.   HumaLOG KwikPen 100 UNIT/ML KiwkPen Generic drug: insulin lispro Inject 5-15 Units into the skin See admin instructions. Inject 5 units subcutaneously three times daily before meals adjusted per carb count - add 2 units for every 25 points   HYDROcodone-acetaminophen 10-325 MG tablet Commonly known as: NORCO Take 1-2 tablets by mouth every 4 (four) hours as needed for moderate pain.   insulin glargine 100 unit/mL Sopn Commonly known as: LANTUS Inject 24 Units into the skin at bedtime.   labetalol 100 MG tablet Commonly known as: NORMODYNE Take 100 mg by mouth 2 (two) times daily.   loperamide 2 MG capsule Commonly known as: IMODIUM Take 1 capsule (2 mg total) by mouth every 12 (twelve) hours.   methimazole 5 MG tablet Commonly known as: TAPAZOLE Take 2.5 mg by mouth daily.   multivitamin with minerals Tabs tablet Take 1 tablet by mouth daily.   mycophenolate 180 MG EC tablet Commonly known as: MYFORTIC Take 180 mg by mouth 2 (two) times daily.   pantoprazole 40 MG tablet Commonly known as: PROTONIX Take 1 tablet (40 mg total) by mouth daily.   predniSONE 10 MG tablet Commonly known as: DELTASONE Take 1-4 tablets (10-40  mg total) by mouth daily. Take 4 tablets (40mg ) daily x2 days, then 2 tablets (20 mg) daily x2 days, then 1 tablet (10mg ) daily x2 days then back to home dose of prednisone 5 mg daily. What changed: You were already taking a medication with the same name, and this prescription was added. Make sure you understand how and when to take each.   predniSONE 5 MG tablet Commonly known as: DELTASONE Take 1 tablet (5 mg total) by mouth daily with breakfast. Start taking on: January 17, 2019 What changed: These instructions start  on January 17, 2019. If you are unsure what to do until then, ask your doctor or other care provider.   [place]ort 300 MG Tabs Take 300 mg by mouth 3 (three) times daily.   STRESS B PO Take 1 tablet by mouth daily.   tacrolimus 1 MG capsule Commonly known as: PROGRAF Take 2 mg by mouth 2 (two) times daily.   tamsulosin 0.4 MG Caps capsule Commonly known as: FLOMAX Take 0.4 mg by mouth at bedtime.   Tradjenta 5 MG Tabs tablet Generic drug: linagliptin Take 5 mg by mouth daily.   vitamin B-12 1000 MCG tablet Commonly known as: CYANOCOBALAMIN Take 1,000 mcg by mouth 2 (two) times daily.      Allergies  Allergen Reactions  . Lisinopril Other (See Comments)    Increased potassium level   . Meloxicam Nausea And Vomiting  . Victoza [Liraglutide] Diarrhea, Nausea And Vomiting and Swelling   Follow-up Information    [name], MD. Schedule an appointment as soon as possible for a visit in 2 week(s).   Specialty: Family Medicine Contact information: [place] [address] [phone]        Park Liter, MD .   Specialty: Cardiology Contact information: [place] [address] [phone]        Corliss Parish, MD. Schedule an appointment as soon as possible for a visit in 1 week(s).   Specialty: Nephrology Contact information: [place] [address] [phone]             The results of significant diagnostics from this hospitalization (including imaging, microbiology, ancillary and laboratory) are listed below for reference.    Significant Diagnostic Studies: CT HEAD WO CONTRAST  Result Date: 01/07/2019 CLINICAL DATA:  Encephalopathy.  COVID-19 positive EXAM: CT HEAD WITHOUT CONTRAST TECHNIQUE: Contiguous axial images were obtained from the base of the skull through the vertex without intravenous contrast. COMPARISON:  CT head 07/26/2017 FINDINGS: Brain: Mild cerebral atrophy. Chronic infarct left parietal operculum is unchanged. Negative for acute infarct, hemorrhage, mass. Vascular: Negative for hyperdense vessel Skull: Negative Sinuses/Orbits: Mucosal edema sphenoid sinus. Remaining sinuses clear. Bilateral cataract surgery. Other: None IMPRESSION: No acute abnormality. Chronic infarct left parietal operculum is unchanged. Electronically Signed   By: Franchot Gallo M.D.   On: 01/07/2019 21:39   US RENAL  Result Date: 12/27/2018 CLINICAL DATA:  Acute renal failure. EXAM: RENAL / URINARY TRACT ULTRASOUND COMPLETE COMPARISON:  CT scan of the abdomen dated 11/15/2017 FINDINGS: Right Kidney: Renal measurements: 8.3 x 4.1 x 3.6 cm = volume: 64 mL. Slight increased echogenicity of the renal parenchyma with thinning of the renal cortex. No hydronephrosis. Left Kidney: Renal measurements: 7.5 x 4.4 x 3.5 cm = volume: 61 mL. Slight increased echogenicity of the renal parenchyma with thinning of the renal cortex. No mass or hydronephrosis visualized. Bladder: Appears normal for degree of bladder distention. Other: None. IMPRESSION: Thinning of the renal cortices with echogenic renal parenchyma consistent with renal medical disease. No obstruction. Electronically Signed   By: Lorriane Shire M.D.   On: 12/27/2018 18:05   DG Chest Port 1 View  Result Date: 01/04/2019 CLINICAL DATA:  Shortness of breath EXAM: PORTABLE CHEST 1 VIEW COMPARISON:  December 27, 2018 FINDINGS:  There is mild cardiomegaly. Increased interstitial markings are seen throughout both lungs. No pleural effusion or pneumothorax. No acute osseous abnormality. IMPRESSION: Increased interstitial markings throughout both lungs which could be due to interstitial edema  and/or infectious etiology. Electronically Signed   By: Prudencio Pair M.D.   On: 01/04/2019 00:48   DG Chest Portable 1 View  Result Date: 12/27/2018 CLINICAL DATA:  Shortness of breath.  COVID. EXAM: PORTABLE CHEST 1 VIEW COMPARISON:  06/18/2017 FINDINGS: Loop recorder projects over the left heart. Mild cardiomegaly. No confluent opacities, effusions or edema. No acute bony abnormality. IMPRESSION: Cardiomegaly.  No acute cardiopulmonary disease. Electronically Signed   By: Rolm Baptise M.D.   On: 12/27/2018 14:06   US Abdomen Limited RUQ  Result Date: 01/04/2019 CLINICAL DATA:  69 year old male with abnormal LFTs. Positive COVID-19. History of renal transplant. EXAM: ULTRASOUND ABDOMEN LIMITED RIGHT UPPER QUADRANT COMPARISON:  CT Abdomen and Pelvis 11/25/2017. Renal ultrasound 12/27/2018. FINDINGS: Gallbladder: No gallstones or wall thickening visualized. No sonographic Murphy sign noted by sonographer. Common bile duct: Diameter: 3 millimeters, normal. Liver: Dense liver echotexture, difficult to penetrate. Hepatic steatosis is possible. No discrete liver lesion. No intrahepatic biliary ductal dilatation. Portal vein is patent on color Doppler imaging with normal direction of blood flow towards the liver. Other: Poorly visible atrophic native right kidney. IMPRESSION: 1. Coarse hepatic echotexture suggesting steatosis and/or chronic hepatocellular disease. 2. No discrete liver lesion, evidence of bile duct obstruction, or gallbladder abnormality. Electronically Signed   By: Genevie Ann M.D.   On: 01/04/2019 08:53    Microbiology: Recent Results (from the past 240 hour(s))  Blood Culture (routine x 2)     Status: None   Collection Time:  01/03/19 11:55 PM   Specimen: BLOOD  Result Value Ref Range Status   Specimen Description BLOOD RIGHT UPPER ARM  Final   Special Requests   Final    BOTTLES DRAWN AEROBIC AND ANAEROBIC Blood Culture results may not be optimal due to an inadequate volume of blood received in culture bottles   Culture   Final    NO GROWTH 5 DAYS Performed at [facility], [place] [address], [place], [place] [address]    Report Status 01/09/2019 FINAL  Final     Labs: Basic Metabolic Panel: Recent Labs  Lab 01/06/19 0426 01/07/19 0500 01/08/19 0348 01/08/19 1417 01/09/19 0332 01/10/19 0514 01/11/19 0302  NA 144 145 142 141 141 140 140  K 5.4* 5.0 5.5* 5.5* 5.0 5.4* 4.9  CL 119* 113* 114* 112* 110 112* 109  CO2 15* 17* 17* 14* 21* 18* 23  GLUCOSE 255* 197* 251* 249* 201* 180* 140*  BUN 71* 83* 94* 96* 84* 81* 71*  CREATININE 2.35* 2.68* 2.90* 2.79* 2.45* 2.53* 2.06*  CALCIUM 8.9 8.8* 8.6* 8.7* 8.7* 8.4* 8.4*  MG 2.2 2.2 2.3  --  2.2 2.3  --   PHOS  --   --   --  4.3  --   --  3.2   Liver Function Tests: Recent Labs  Lab 01/06/19 0426 01/07/19 0500 01/08/19 0348 01/08/19 1417 01/09/19 0332 01/10/19 0514 01/11/19 0302  AST 25 21 28   --  18 19  --   ALT 74* 62* 49*  --  45* 37  --   ALKPHOS 64 65 61  --  69 61  --   BILITOT 0.1* 0.3 1.0  --  0.3 0.5  --   PROT 6.2* 6.1* 5.8*  --  5.6* 5.4*  --   ALBUMIN 2.5* 2.5* 2.5* 2.4* 2.4* 2.3* 2.3*   No results for input(s): LIPASE, AMYLASE in the last 168 hours. No results for input(s): AMMONIA in the last 168 hours. CBC:  Recent Labs  Lab 01/06/19 0426 01/07/19 0500 01/08/19 0348 01/08/19 0349 01/09/19 0332 01/10/19 0514 01/11/19 0302  WBC 7.2 8.0 7.0  --  7.1 6.9 8.3  NEUTROABS 6.5 7.2 6.2  --  6.3 6.0  --   HGB 11.9* 12.2* 12.4*  --  11.9* 11.2* 11.0*  HCT 37.1* 38.1* 38.5* 37.0* 36.3* 34.2* 33.8*  MCV 92.3 90.9 93.7  --  90.8 91.4 91.1  PLT 202 202 214  --  192 174 181   Cardiac Enzymes: No results for input(s): CKTOTAL,  CKMB, CKMBINDEX, TROPONINI in the last 168 hours. BNP: BNP (last 3 results) Recent Labs    01/04/19 0012  BNP 59.0    ProBNP (last 3 results) No results for input(s): PROBNP in the last 8760 hours.  CBG: Recent Labs  Lab 01/10/19 1209 01/10/19 1624 01/10/19 2131 01/11/19 0748 01/11/19 1134  GLUCAP 339* 225* 177* 147* 252*       Signed:  Irine Seal MD.  [facility] 01/11/2019, 12:47 PM

## 2019-01-11 NOTE — Progress Notes (Signed)
[facility] Progress Note    Assessment/ Plan:   1. CKD3T and some mild AKI, sp KT 2015 on Tac/MMF, followed by [place] at [facility]; baseline SCr 2 to 2.5--> at baseline now.  Continue Myfortic 180 BID, Prograf 2 BID, pred 5 mg daily.  OK for d/c-- would stop bicarb and lokelma, restart home dose lasix 40 mg daily, and have close followup with our office- will help arrange.   2. AKI, labile SCr but appears improved 3. COVID 19, per [facility], on decadron and remdesiver 4. HTN/Vol: BPs stable 5. Hyperkalemia: resolved 6. NAG Met acidosis resolved  Subjective:    K and Cr improved.     Objective:   BP 140/72 (BP Location: Right Arm)   Pulse 60   Temp (!) 97.4 F (36.3 C) (Axillary)   Resp 16   Wt 100.2 kg   SpO2 92%   BMI 30.81 kg/m   Intake/Output Summary (Last 24 hours) at 01/11/2019 1032 Last data filed at 01/11/2019 0856 Gross per 24 hour  Intake --  Output 600 ml  Net -600 ml   Weight change:   Physical Exam: Physical exam deferred today d/t COVID + status in effort to preserve PPE  Imaging: No results found.  Labs: BMET Recent Labs  Lab 01/06/19 0426 01/07/19 0500 01/08/19 0348 01/08/19 1417 01/09/19 0332 01/10/19 0514 01/11/19 0302  NA 144 145 142 141 141 140 140  K 5.4* 5.0 5.5* 5.5* 5.0 5.4* 4.9  CL 119* 113* 114* 112* 110 112* 109  CO2 15* 17* 17* 14* 21* 18* 23  GLUCOSE 255* 197* 251* 249* 201* 180* 140*  BUN 71* 83* 94* 96* 84* 81* 71*  CREATININE 2.35* 2.68* 2.90* 2.79* 2.45* 2.53* 2.06*  CALCIUM 8.9 8.8* 8.6* 8.7* 8.7* 8.4* 8.4*  PHOS  --   --   --  4.3  --   --  3.2   CBC Recent Labs  Lab 01/07/19 0500 01/08/19 0348 01/08/19 0349 01/09/19 0332 01/10/19 0514 01/11/19 0302  WBC 8.0 7.0  --  7.1 6.9 8.3  NEUTROABS 7.2 6.2  --  6.3 6.0  --   HGB 12.2* 12.4*  --  11.9* 11.2* 11.0*  HCT 38.1* 38.5* 37.0* 36.3* 34.2* 33.8*  MCV 90.9 93.7  --  90.8 91.4 91.1  PLT 202 214  --  192 174 181    Medications:    . aspirin EC  81  mg Oral Daily  . atorvastatin  40 mg Oral Daily  . calcitRIOL  0.25 mcg Oral See admin instructions  . dexamethasone (DECADRON) injection  6 mg Intravenous Q24H  . gabapentin  300 mg Oral BID  . heparin  5,000 Units Subcutaneous Q8H  . HYDROcodone-acetaminophen  1-2 tablet Oral Q6H  . insulin aspart  0-9 Units Subcutaneous TID WC  . insulin aspart  4 Units Subcutaneous TID WC  . insulin glargine  30 Units Subcutaneous QHS  . labetalol  100 mg Oral BID  . linagliptin  5 mg Oral Daily  . methimazole  2.5 mg Oral Daily  . multivitamin with minerals  1 tablet Oral Daily  . mycophenolate  180 mg Oral BID  . sodium bicarbonate  1,300 mg Oral BID  . sodium zirconium cyclosilicate  10 g Oral BID  . tacrolimus  2 mg Oral BID  . tamsulosin  0.4 mg Oral QHS      Madelon Lips, MD [facility] pgr [phone] 01/11/2019, 10:32 AM

## 2019-01-11 NOTE — Progress Notes (Signed)
[name] to be D/C'd Home per MD order.  Discussed with the patient and all questions fully answered.  VSS, Skin clean, dry and intact without evidence of skin break down, no evidence of skin tears noted. IV catheter discontinued intact. Site without signs and symptoms of complications. Dressing and pressure applied.  An After Visit Summary was printed and given to the patient. Patient received prescriptions.  D/c education completed with patient including follow up instructions, medication list, d/c activities limitations if indicated, with other d/c instructions as indicated by MD - patient able to verbalize understanding, all questions fully answered.   Patient instructed to return to ED, call 911, or call MD for any changes in condition.   Patient escorted via [place], and D/C home via private auto.  [name] 01/11/2019 3:49 PM

## 2019-01-11 NOTE — Progress Notes (Signed)
Occupational Therapy Treatment Patient Details Name: Jon [name] MRN: [id number] DOB: [birth year] Today's Date: 01/11/2019    History of present illness 69 y.o. male with history of renal transplant stage IV kidney disease now, diabetes mellitus type 2, hypertension and history of stroke who was just discharged 12/22 after being admitted for Covid 19 infection with diarrhea. Returned to ED 12/25 with SoB where Chest x-ray in the ER shows interstitial changes which could be either from Covid infection or fluid overload admitted 12/25 for treatment of acute respiratory failure with hypoxia secondary to COVID.   OT comments  Pt progressing well toward stated goals. Shows improvement with cognition this date from evaluation. More oriented to situation and needing only min problem solving cues and increased time. Session focus on ECS training with BADL And dressing for d/c. Pt able to dress UB with set up A and LB with min A. 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 Noted sats in mid 37s with poor pleth reading. Changed source of reading, pt in low to mid 90s with exertion. Cued pt to take slow pursed lip breaths during dressing tasks. Educated pt on purchasing a pulse ox for home to self monitor. Will continue to follow. D/c recs remain appropriate.    Follow Up Recommendations  Home health OT;Supervision/Assistance - 24 hour    Equipment Recommendations  3 in 1 bedside commode    Recommendations for Other Services      Precautions / Restrictions Precautions Precautions: Fall Precaution Comments: reports he has had near falls Restrictions Weight Bearing Restrictions: No       Mobility Bed Mobility Overal bed mobility: Modified Independent                Transfers Overall transfer level: Needs assistance Equipment used: None Transfers: Sit to/from Stand Sit to Stand: Min guard         General transfer comment: min guard for safety while standing to pull up pants    Balance  Overall balance assessment: Needs assistance Sitting-balance support: Feet supported;No upper extremity supported Sitting balance-Leahy Scale: Fair                                     ADL either performed or assessed with clinical judgement   ADL Overall ADL's : Needs assistance/impaired                 Upper Body Dressing : Set up;Sitting Upper Body Dressing Details (indicate cue type and reason): to don own shirt for d/c Lower Body Dressing: Minimal assistance;Sit to/from stand Lower Body Dressing Details (indicate cue type and reason): donned sweat pants, socks and shoes with min A; usually uses reacher               General ADL Comments: continued ECS training with BADLs in preparation for todays d/c     Vision Patient Visual Report: No change from baseline     Perception     Praxis      Cognition Arousal/Alertness: Awake/alert Behavior During Therapy: WFL for tasks assessed/performed Overall Cognitive Status: Impaired/Different from baseline Area of Impairment: Orientation;Problem solving                 Orientation Level: Disoriented to;Time           Problem Solving: Slow processing;Requires tactile cues General Comments: increased cognition from eval date, pt is able to recall more of situation-  still struggles with date and needs cues and min incrased processing time        Exercises     Shoulder Instructions       General Comments      Pertinent Vitals/ Pain       Pain Assessment: No/denies pain  Home Living                                          Prior Functioning/Environment              Frequency  Min 2X/week        Progress Toward Goals  OT Goals(current goals can now be found in the care plan section)  Progress towards OT goals: Progressing toward goals  Acute Rehab OT Goals Patient Stated Goal: go home OT Goal Formulation: With patient Time For Goal Achievement:  01/21/19 Potential to Achieve Goals: Good  Plan Discharge plan remains appropriate    Co-evaluation                 AM-PAC OT "6 Clicks" Daily Activity     Outcome Measure   Help from another person eating meals?: A Little Help from another person taking care of personal grooming?: A Little Help from another person toileting, which includes using toliet, bedpan, or urinal?: A Little Help from another person bathing (including washing, rinsing, drying)?: A Little Help from another person to put on and taking off regular upper body clothing?: A Little Help from another person to put on and taking off regular lower body clothing?: A Little 6 Click Score: 18    End of Session Equipment Utilized During Treatment: Gait belt  OT Visit Diagnosis: Unsteadiness on feet (R26.81);Other abnormalities of gait and mobility (R26.89);Muscle weakness (generalized) (M62.81);Other symptoms and signs involving cognitive function   Activity Tolerance Patient tolerated treatment well   Patient Left in bed;with call bell/phone within reach   Nurse Communication Mobility status        Time: 1601-0932 OT Time Calculation (min): 13 min  Charges: OT General Charges $OT Visit: 1 Visit OT Treatments $Self Care/Home Management : 8-22 mins  Zenovia Jarred, MSOT, OTR/L [facility] Office: [phone]  Zenovia Jarred 01/11/2019, 2:34 PM

## 2019-01-14 ENCOUNTER — Other Ambulatory Visit: Payer: Self-pay

## 2019-01-14 NOTE — Patient Outreach (Signed)
RED EMMI:  Date of discharage: 01/11/2019 Date of red emmi: 01/13/2019 Reason for red emmi:  Follow up appointments scheduled-no  Placed call to patient and explained reason for call.  Patient and his wife reports all appointments were made today. Inquired about other needs and patient reports that he is doing great and his breathing is good.  PLAN: close emmi case.  Tomasa Rand, RN, BSN, CEN [facility] [employer] [phone]

## 2019-01-15 DIAGNOSIS — J9601 Acute respiratory failure with hypoxia: Secondary | ICD-10-CM | POA: Diagnosis not present

## 2019-01-15 DIAGNOSIS — E1122 Type 2 diabetes mellitus with diabetic chronic kidney disease: Secondary | ICD-10-CM | POA: Diagnosis not present

## 2019-01-15 DIAGNOSIS — I1310 Hypertensive heart and chronic kidney disease without heart failure, with stage 1 through stage 4 chronic kidney disease, or unspecified chronic kidney disease: Secondary | ICD-10-CM | POA: Diagnosis not present

## 2019-01-16 DIAGNOSIS — J9601 Acute respiratory failure with hypoxia: Secondary | ICD-10-CM | POA: Diagnosis not present

## 2019-01-16 DIAGNOSIS — E1122 Type 2 diabetes mellitus with diabetic chronic kidney disease: Secondary | ICD-10-CM | POA: Diagnosis not present

## 2019-01-16 DIAGNOSIS — I1310 Hypertensive heart and chronic kidney disease without heart failure, with stage 1 through stage 4 chronic kidney disease, or unspecified chronic kidney disease: Secondary | ICD-10-CM | POA: Diagnosis not present

## 2019-01-17 DIAGNOSIS — J9601 Acute respiratory failure with hypoxia: Secondary | ICD-10-CM | POA: Diagnosis not present

## 2019-01-17 DIAGNOSIS — I1310 Hypertensive heart and chronic kidney disease without heart failure, with stage 1 through stage 4 chronic kidney disease, or unspecified chronic kidney disease: Secondary | ICD-10-CM | POA: Diagnosis not present

## 2019-01-17 DIAGNOSIS — E1122 Type 2 diabetes mellitus with diabetic chronic kidney disease: Secondary | ICD-10-CM | POA: Diagnosis not present

## 2019-01-18 ENCOUNTER — Other Ambulatory Visit: Payer: Self-pay

## 2019-01-18 ENCOUNTER — Telehealth: Payer: Self-pay | Admitting: Cardiology

## 2019-01-18 DIAGNOSIS — E1121 Type 2 diabetes mellitus with diabetic nephropathy: Secondary | ICD-10-CM | POA: Diagnosis not present

## 2019-01-18 DIAGNOSIS — U071 COVID-19: Secondary | ICD-10-CM | POA: Diagnosis not present

## 2019-01-18 DIAGNOSIS — I259 Chronic ischemic heart disease, unspecified: Secondary | ICD-10-CM | POA: Diagnosis not present

## 2019-01-18 NOTE — Telephone Encounter (Signed)
Please call patient regarding his BP meds and patient was in the hospital recently and his BP is running low and heartrate  Fluctuatting.[name] He needs guidance about the Labetolol.

## 2019-01-18 NOTE — Telephone Encounter (Signed)
Spoke with pt and his wife.  Advised of Dr. Wendy Poet recommendation to continue to hold labetolol, check BP regularly and if BP >160/100 resume medication BID.  Spouse states that pt saw PCP today, Dr. Lin Landsman advised them to restart Labetolol at 50mg  BID.  She is going to follow PCP order and monitor BP closely, if pt continues to have low BP results she will have him hold labetolol all together.

## 2019-01-18 NOTE — Patient Outreach (Signed)
Telephone assessment:  Received red emmi for patient that was cleared.  Then received referral post discharge.   Placed call to patient to review [facility] program and determine interest and needs. No answer. I left a message requesting a call back.  PLAN: will mail an unsuccessful outreach letter and attempt again in 3 business days.  Tomasa Rand, RN, BSN, CEN [facility] [employer] [phone]

## 2019-01-18 NOTE — Telephone Encounter (Signed)
Pt requested I speak with spouse.  She reports post hospital d/c pt was taking Labetolol 100mg  BID.  He stopped taking Labetolol a couple of days ago because it was making him feel lightheaded.  Spouse was not able to provide BP/ HR data from prior to this AM.  She did check his BP/ HR upon waking around 10am because he felt "fuzzy". BP standing was 95/50, HR 124.    A couple hours later when [facility] RN visited, pt was feeling better.  Spouse recalls that RN assessed BP with reported result of 130/80 she did not record HR.  Pt continues to feel well now and will be heading to PCP appt in next hour.  Spouse is requesting of MD has recommendation for ongoing management of labetolol.

## 2019-01-18 NOTE — Telephone Encounter (Signed)
Please call patient and not the [facility] RN.[name]

## 2019-01-18 NOTE — Telephone Encounter (Signed)
He need to start checking his blood pressure on the regular basis.  If he see blood pressure more than 160/100 he need to take labetalol 100 mg up to twice daily otherwise he can call this medication.  We will contact him at the beginning of next week to see how he does

## 2019-01-19 ENCOUNTER — Telehealth: Payer: Self-pay | Admitting: Cardiology

## 2019-01-19 NOTE — Telephone Encounter (Signed)
I received a call from the patient wife ( the patient does see Dr. Agustin Cree) reporting that he has been experiencing low standing blood pressure. She stated that he has been experiencing this for a week. She noted that because of this his pcp recommended that he decrease his labetalol to 50 mg BID. She states that even after this he is still hypotensive standing.  For example his blood pressure yesterday: sitting 134/89 mmHg standing 90/11mmHG, Sh notes that his heart rate also increases while standing. Today she is concern even more because his standing blood pressure his systolic was in the 95M.   Reviewing his current medication regimen: he takes Lasix 40 mg daily and Labetaolol recently decrease to 50mg  BID ( half pill of his 100 mg dose).  After speaking with the patient's wife and reviewing his medications, I am concern about orthostatic hypotension. I have asked her to hold the lasix, In addition she should take his blood pressure before getting his 50mg  Labetalol. If the systolic blood pressure is less the 148mmg to hold the dose.   She was advised to call the office on Monday for a sooner office visit. I also urge he that if he becomes dizzy or lightheaded to go to the nearest ED. The patient's wife expresses understanding.

## 2019-01-20 ENCOUNTER — Other Ambulatory Visit: Payer: Self-pay

## 2019-01-20 ENCOUNTER — Emergency Department (HOSPITAL_COMMUNITY): Payer: Medicare Other

## 2019-01-20 ENCOUNTER — Inpatient Hospital Stay (HOSPITAL_COMMUNITY)
Admission: EM | Admit: 2019-01-20 | Discharge: 2019-01-27 | DRG: 378 | Disposition: A | Discharge status: Home Health | Payer: Medicare Other | Attending: Internal Medicine | Admitting: Internal Medicine

## 2019-01-20 DIAGNOSIS — Z6831 Body mass index (BMI) 31.0-31.9, adult: Secondary | ICD-10-CM

## 2019-01-20 DIAGNOSIS — Z940 Kidney transplant status: Secondary | ICD-10-CM

## 2019-01-20 DIAGNOSIS — K922 Gastrointestinal hemorrhage, unspecified: Secondary | ICD-10-CM

## 2019-01-20 DIAGNOSIS — Z743 Need for continuous supervision: Secondary | ICD-10-CM | POA: Diagnosis not present

## 2019-01-20 DIAGNOSIS — E860 Dehydration: Secondary | ICD-10-CM | POA: Diagnosis not present

## 2019-01-20 DIAGNOSIS — D84821 Immunodeficiency due to drugs: Secondary | ICD-10-CM | POA: Diagnosis not present

## 2019-01-20 DIAGNOSIS — R918 Other nonspecific abnormal finding of lung field: Secondary | ICD-10-CM | POA: Diagnosis not present

## 2019-01-20 DIAGNOSIS — D62 Acute posthemorrhagic anemia: Secondary | ICD-10-CM | POA: Diagnosis not present

## 2019-01-20 DIAGNOSIS — R5381 Other malaise: Secondary | ICD-10-CM | POA: Diagnosis not present

## 2019-01-20 DIAGNOSIS — E119 Type 2 diabetes mellitus without complications: Secondary | ICD-10-CM | POA: Diagnosis not present

## 2019-01-20 DIAGNOSIS — K269 Duodenal ulcer, unspecified as acute or chronic, without hemorrhage or perforation: Secondary | ICD-10-CM | POA: Diagnosis not present

## 2019-01-20 DIAGNOSIS — I639 Cerebral infarction, unspecified: Secondary | ICD-10-CM | POA: Diagnosis present

## 2019-01-20 DIAGNOSIS — I509 Heart failure, unspecified: Secondary | ICD-10-CM | POA: Diagnosis not present

## 2019-01-20 DIAGNOSIS — K264 Chronic or unspecified duodenal ulcer with hemorrhage: Principal | ICD-10-CM | POA: Diagnosis present

## 2019-01-20 DIAGNOSIS — N179 Acute kidney failure, unspecified: Secondary | ICD-10-CM | POA: Diagnosis present

## 2019-01-20 DIAGNOSIS — I259 Chronic ischemic heart disease, unspecified: Secondary | ICD-10-CM | POA: Diagnosis present

## 2019-01-20 DIAGNOSIS — Z794 Long term (current) use of insulin: Secondary | ICD-10-CM | POA: Diagnosis not present

## 2019-01-20 DIAGNOSIS — Z8616 Personal history of COVID-19: Secondary | ICD-10-CM | POA: Diagnosis not present

## 2019-01-20 DIAGNOSIS — Z841 Family history of disorders of kidney and ureter: Secondary | ICD-10-CM

## 2019-01-20 DIAGNOSIS — Z8701 Personal history of pneumonia (recurrent): Secondary | ICD-10-CM

## 2019-01-20 DIAGNOSIS — D638 Anemia in other chronic diseases classified elsewhere: Secondary | ICD-10-CM | POA: Diagnosis present

## 2019-01-20 DIAGNOSIS — K2960 Other gastritis without bleeding: Secondary | ICD-10-CM

## 2019-01-20 DIAGNOSIS — G4733 Obstructive sleep apnea (adult) (pediatric): Secondary | ICD-10-CM | POA: Diagnosis not present

## 2019-01-20 DIAGNOSIS — E1165 Type 2 diabetes mellitus with hyperglycemia: Secondary | ICD-10-CM | POA: Diagnosis present

## 2019-01-20 DIAGNOSIS — Z87891 Personal history of nicotine dependence: Secondary | ICD-10-CM

## 2019-01-20 DIAGNOSIS — T8619 Other complication of kidney transplant: Secondary | ICD-10-CM | POA: Diagnosis not present

## 2019-01-20 DIAGNOSIS — Z833 Family history of diabetes mellitus: Secondary | ICD-10-CM

## 2019-01-20 DIAGNOSIS — R000 Tachycardia, unspecified: Secondary | ICD-10-CM | POA: Diagnosis not present

## 2019-01-20 DIAGNOSIS — I130 Hypertensive heart and chronic kidney disease with heart failure and stage 1 through stage 4 chronic kidney disease, or unspecified chronic kidney disease: Secondary | ICD-10-CM | POA: Diagnosis present

## 2019-01-20 DIAGNOSIS — K219 Gastro-esophageal reflux disease without esophagitis: Secondary | ICD-10-CM | POA: Diagnosis present

## 2019-01-20 DIAGNOSIS — Y830 Surgical operation with transplant of whole organ as the cause of abnormal reaction of the patient, or of later complication, without mention of misadventure at the time of the procedure: Secondary | ICD-10-CM | POA: Diagnosis present

## 2019-01-20 DIAGNOSIS — D696 Thrombocytopenia, unspecified: Secondary | ICD-10-CM | POA: Diagnosis not present

## 2019-01-20 DIAGNOSIS — E669 Obesity, unspecified: Secondary | ICD-10-CM | POA: Diagnosis present

## 2019-01-20 DIAGNOSIS — E785 Hyperlipidemia, unspecified: Secondary | ICD-10-CM | POA: Diagnosis not present

## 2019-01-20 DIAGNOSIS — I959 Hypotension, unspecified: Secondary | ICD-10-CM | POA: Diagnosis not present

## 2019-01-20 DIAGNOSIS — E875 Hyperkalemia: Secondary | ICD-10-CM | POA: Diagnosis not present

## 2019-01-20 DIAGNOSIS — N184 Chronic kidney disease, stage 4 (severe): Secondary | ICD-10-CM | POA: Diagnosis present

## 2019-01-20 DIAGNOSIS — Z7982 Long term (current) use of aspirin: Secondary | ICD-10-CM

## 2019-01-20 DIAGNOSIS — Z8673 Personal history of transient ischemic attack (TIA), and cerebral infarction without residual deficits: Secondary | ICD-10-CM

## 2019-01-20 DIAGNOSIS — U071 COVID-19: Secondary | ICD-10-CM | POA: Diagnosis not present

## 2019-01-20 DIAGNOSIS — Z8349 Family history of other endocrine, nutritional and metabolic diseases: Secondary | ICD-10-CM

## 2019-01-20 DIAGNOSIS — E1122 Type 2 diabetes mellitus with diabetic chronic kidney disease: Secondary | ICD-10-CM | POA: Diagnosis present

## 2019-01-20 DIAGNOSIS — K449 Diaphragmatic hernia without obstruction or gangrene: Secondary | ICD-10-CM | POA: Diagnosis not present

## 2019-01-20 DIAGNOSIS — E890 Postprocedural hypothyroidism: Secondary | ICD-10-CM | POA: Diagnosis not present

## 2019-01-20 DIAGNOSIS — K921 Melena: Secondary | ICD-10-CM | POA: Diagnosis not present

## 2019-01-20 DIAGNOSIS — R531 Weakness: Secondary | ICD-10-CM | POA: Diagnosis not present

## 2019-01-20 DIAGNOSIS — Z79899 Other long term (current) drug therapy: Secondary | ICD-10-CM

## 2019-01-20 DIAGNOSIS — Z8719 Personal history of other diseases of the digestive system: Secondary | ICD-10-CM

## 2019-01-20 DIAGNOSIS — D649 Anemia, unspecified: Secondary | ICD-10-CM | POA: Diagnosis not present

## 2019-01-20 DIAGNOSIS — I272 Pulmonary hypertension, unspecified: Secondary | ICD-10-CM | POA: Diagnosis present

## 2019-01-20 DIAGNOSIS — R0902 Hypoxemia: Secondary | ICD-10-CM | POA: Diagnosis not present

## 2019-01-20 DIAGNOSIS — T380X5A Adverse effect of glucocorticoids and synthetic analogues, initial encounter: Secondary | ICD-10-CM | POA: Diagnosis present

## 2019-01-20 DIAGNOSIS — K760 Fatty (change of) liver, not elsewhere classified: Secondary | ICD-10-CM | POA: Diagnosis present

## 2019-01-20 DIAGNOSIS — Z8249 Family history of ischemic heart disease and other diseases of the circulatory system: Secondary | ICD-10-CM

## 2019-01-20 HISTORY — DX: Gastrointestinal hemorrhage, unspecified: K92.2

## 2019-01-20 LAB — CBC
HCT: 27.2 % — ABNORMAL LOW (ref 39.0–52.0)
Hemoglobin: 8.5 g/dL — ABNORMAL LOW (ref 13.0–17.0)
MCH: 30.4 pg (ref 26.0–34.0)
MCHC: 31.3 g/dL (ref 30.0–36.0)
MCV: 97.1 fL (ref 80.0–100.0)
Platelets: 169 10*3/uL (ref 150–400)
RBC: 2.8 MIL/uL — ABNORMAL LOW (ref 4.22–5.81)
RDW: 15 % (ref 11.5–15.5)
WBC: 12.8 10*3/uL — ABNORMAL HIGH (ref 4.0–10.5)
nRBC: 0 % (ref 0.0–0.2)

## 2019-01-20 LAB — BASIC METABOLIC PANEL
Anion gap: 9 (ref 5–15)
BUN: 53 mg/dL — ABNORMAL HIGH (ref 8–23)
CO2: 24 mmol/L (ref 22–32)
Calcium: 8.6 mg/dL — ABNORMAL LOW (ref 8.9–10.3)
Chloride: 109 mmol/L (ref 98–111)
Creatinine, Ser: 2.04 mg/dL — ABNORMAL HIGH (ref 0.61–1.24)
GFR calc Af Amer: 38 mL/min — ABNORMAL LOW (ref >60)
GFR calc non Af Amer: 33 mL/min — ABNORMAL LOW (ref >60)
Glucose, Bld: 100 mg/dL — ABNORMAL HIGH (ref 70–99)
Potassium: 4.3 mmol/L (ref 3.5–5.1)
Sodium: 142 mmol/L (ref 135–145)

## 2019-01-20 LAB — HEPATIC FUNCTION PANEL
ALT: 35 U/L (ref 0–44)
AST: 21 U/L (ref 15–41)
Albumin: 2.5 g/dL — ABNORMAL LOW (ref 3.5–5.0)
Alkaline Phosphatase: 55 U/L (ref 38–126)
Bilirubin, Direct: <0.1 mg/dL (ref 0.0–0.2)
Total Bilirubin: 0.7 mg/dL (ref 0.3–1.2)
Total Protein: 5.4 g/dL — ABNORMAL LOW (ref 6.5–8.1)

## 2019-01-20 LAB — PREPARE RBC (CROSSMATCH)

## 2019-01-20 LAB — LACTIC ACID, PLASMA: Lactic Acid, Venous: 1.7 mmol/L (ref 0.5–1.9)

## 2019-01-20 LAB — CBG MONITORING, ED: Glucose-Capillary: 154 mg/dL — ABNORMAL HIGH (ref 70–99)

## 2019-01-20 LAB — TROPONIN I (HIGH SENSITIVITY): Troponin I (High Sensitivity): 19 ng/L — ABNORMAL HIGH (ref <18)

## 2019-01-20 LAB — POC OCCULT BLOOD, ED: Fecal Occult Bld: POSITIVE — AB

## 2019-01-20 MED ORDER — ATORVASTATIN CALCIUM 40 MG PO TABS
40.0000 mg | ORAL_TABLET | Freq: Every day | ORAL | Status: DC
  Administered 2019-01-20 – 2019-01-26 (×7): 40 mg via ORAL
  Filled 2019-01-20 (×7): qty 1

## 2019-01-20 MED ORDER — HYDROCODONE-ACETAMINOPHEN 10-325 MG PO TABS
1.0000 | ORAL_TABLET | ORAL | Status: DC | PRN
  Administered 2019-01-20: 1 via ORAL
  Administered 2019-01-22: 2 via ORAL
  Administered 2019-01-22: 1 via ORAL
  Administered 2019-01-22: 2 via ORAL
  Administered 2019-01-23 – 2019-01-24 (×3): 1 via ORAL
  Administered 2019-01-24 – 2019-01-26 (×4): 2 via ORAL
  Filled 2019-01-20: qty 2
  Filled 2019-01-20 (×2): qty 1
  Filled 2019-01-20: qty 2
  Filled 2019-01-20: qty 1
  Filled 2019-01-20: qty 2
  Filled 2019-01-20: qty 1
  Filled 2019-01-20 (×3): qty 2
  Filled 2019-01-20: qty 1

## 2019-01-20 MED ORDER — PREDNISONE 5 MG PO TABS
5.0000 mg | ORAL_TABLET | Freq: Every day | ORAL | Status: DC
  Administered 2019-01-22 – 2019-01-27 (×6): 5 mg via ORAL
  Filled 2019-01-20 (×7): qty 1

## 2019-01-20 MED ORDER — METHIMAZOLE 5 MG PO TABS
2.5000 mg | ORAL_TABLET | Freq: Every day | ORAL | Status: DC
  Administered 2019-01-20 – 2019-01-26 (×7): 2.5 mg via ORAL
  Filled 2019-01-20 (×9): qty 1

## 2019-01-20 MED ORDER — MYCOPHENOLATE SODIUM 180 MG PO TBEC
180.0000 mg | DELAYED_RELEASE_TABLET | Freq: Two times a day (BID) | ORAL | Status: DC
  Administered 2019-01-20 – 2019-01-27 (×14): 180 mg via ORAL
  Filled 2019-01-20 (×17): qty 1

## 2019-01-20 MED ORDER — ONDANSETRON HCL 4 MG/2ML IJ SOLN: 4.0000 mg | Freq: Four times a day (QID) | INTRAMUSCULAR | Status: DC | PRN

## 2019-01-20 MED ORDER — ONDANSETRON HCL 4 MG PO TABS: 4.0000 mg | ORAL_TABLET | Freq: Four times a day (QID) | ORAL | Status: DC | PRN

## 2019-01-20 MED ORDER — SODIUM CHLORIDE 0.9 % IV SOLN
8.0000 mg/h | INTRAVENOUS | Status: AC
  Administered 2019-01-20 – 2019-01-23 (×6): 8 mg/h via INTRAVENOUS
  Filled 2019-01-20 (×9): qty 80

## 2019-01-20 MED ORDER — SODIUM CHLORIDE 0.9 % IV SOLN
80.0000 mg | Freq: Once | INTRAVENOUS | Status: AC
  Administered 2019-01-20: 80 mg via INTRAVENOUS
  Filled 2019-01-20: qty 80

## 2019-01-20 MED ORDER — INSULIN GLARGINE 100 UNIT/ML ~~LOC~~ SOLN
12.0000 [IU] | Freq: Every day | SUBCUTANEOUS | Status: DC
  Administered 2019-01-20 – 2019-01-26 (×7): 12 [IU] via SUBCUTANEOUS
  Filled 2019-01-20 (×9): qty 0.12

## 2019-01-20 MED ORDER — ACETAMINOPHEN 325 MG PO TABS: 650.0000 mg | ORAL_TABLET | Freq: Four times a day (QID) | ORAL | Status: DC | PRN

## 2019-01-20 MED ORDER — TACROLIMUS 1 MG PO CAPS
2.0000 mg | ORAL_CAPSULE | Freq: Two times a day (BID) | ORAL | Status: DC
  Administered 2019-01-20 – 2019-01-27 (×14): 2 mg via ORAL
  Filled 2019-01-20 (×15): qty 2

## 2019-01-20 MED ORDER — ACETAMINOPHEN 650 MG RE SUPP
650.0000 mg | Freq: Four times a day (QID) | RECTAL | Status: DC | PRN
  Administered 2019-01-27: 650 mg via RECTAL
  Filled 2019-01-20: qty 1

## 2019-01-20 MED ORDER — TAMSULOSIN HCL 0.4 MG PO CAPS
0.4000 mg | ORAL_CAPSULE | Freq: Every day | ORAL | Status: DC
  Administered 2019-01-20 – 2019-01-26 (×7): 0.4 mg via ORAL
  Filled 2019-01-20 (×7): qty 1

## 2019-01-20 MED ORDER — LABETALOL HCL 100 MG PO TABS
50.0000 mg | ORAL_TABLET | Freq: Two times a day (BID) | ORAL | Status: DC
  Administered 2019-01-20 – 2019-01-22 (×3): 50 mg via ORAL
  Filled 2019-01-20 (×3): qty 0.5
  Filled 2019-01-20 (×2): qty 1

## 2019-01-20 MED ORDER — GABAPENTIN 300 MG PO CAPS
300.0000 mg | ORAL_CAPSULE | Freq: Two times a day (BID) | ORAL | Status: DC
  Administered 2019-01-20 – 2019-01-21 (×2): 300 mg via ORAL
  Filled 2019-01-20 (×2): qty 1

## 2019-01-20 MED ORDER — SODIUM CHLORIDE 0.9% IV SOLUTION: Freq: Once | INTRAVENOUS | Status: AC

## 2019-01-20 MED ORDER — SODIUM CHLORIDE 0.9% FLUSH: 3.0000 mL | Freq: Once | INTRAVENOUS | Status: DC

## 2019-01-20 MED ORDER — SODIUM CHLORIDE 0.9 % IV BOLUS
500.0000 mL | Freq: Once | INTRAVENOUS | Status: AC
  Administered 2019-01-20: 500 mL via INTRAVENOUS

## 2019-01-20 MED ORDER — INSULIN ASPART 100 UNIT/ML ~~LOC~~ SOLN
0.0000 [IU] | Freq: Three times a day (TID) | SUBCUTANEOUS | Status: DC
  Administered 2019-01-21: 2 [IU] via SUBCUTANEOUS
  Administered 2019-01-21: 3 [IU] via SUBCUTANEOUS
  Administered 2019-01-21: 2 [IU] via SUBCUTANEOUS
  Administered 2019-01-22 (×2): 3 [IU] via SUBCUTANEOUS
  Administered 2019-01-22 – 2019-01-23 (×2): 2 [IU] via SUBCUTANEOUS
  Administered 2019-01-23: 1 [IU] via SUBCUTANEOUS
  Administered 2019-01-23: 3 [IU] via SUBCUTANEOUS
  Administered 2019-01-24: 12:00:00 2 [IU] via SUBCUTANEOUS
  Administered 2019-01-24: 3 [IU] via SUBCUTANEOUS
  Administered 2019-01-24: 1 [IU] via SUBCUTANEOUS
  Administered 2019-01-25 (×2): 3 [IU] via SUBCUTANEOUS
  Administered 2019-01-25: 1 [IU] via SUBCUTANEOUS
  Administered 2019-01-26: 2 [IU] via SUBCUTANEOUS
  Administered 2019-01-26: 1 [IU] via SUBCUTANEOUS
  Administered 2019-01-26: 2 [IU] via SUBCUTANEOUS
  Administered 2019-01-27 (×2): 1 [IU] via SUBCUTANEOUS

## 2019-01-20 MED ORDER — ADULT MULTIVITAMIN W/MINERALS CH
1.0000 | ORAL_TABLET | Freq: Every day | ORAL | Status: DC
  Administered 2019-01-20 – 2019-01-27 (×8): 1 via ORAL
  Filled 2019-01-20 (×8): qty 1

## 2019-01-20 NOTE — ED Notes (Signed)
IV team at bedside 

## 2019-01-20 NOTE — H&P (Signed)
History and Physical    Jon [name] IEP:[id number] DOB: [birth year] DOA: 01/20/2019  Referring MD/NP/PA: EDP PCP:  Patient coming from: Home  Chief Complaint: Weakness, low blood pressure  HPI: Jon [name] is a 69 y.o. male with medical history significant of stage IV chronic kidney disease, status post renal transplant on chronic immunosuppression, type 2 diabetes mellitus, hypertension, history of CVA, recent discharge from the hospital after treatment for Covid pneumonia presents to the emergency room with weakness and low blood pressure for the last couple of days. -Patient was originally diagnosed with COVID-19 on 12/17, then he was admitted with GI symptoms and AKI, discharged home on 12/21, readmitted on 12/25 with hypoxia/Covid pneumonia, treated with IV steroids and remdesivir, discharged home on steroid taper back to his home regimen of 5 mg of prednisone. -Patient reports feeling well for the first few days after being home subsequently developed worsening weakness, dizziness, dyspnea on exertion. -He noticed black stools for a few days recently, denies any hematemesis or hematochezia.  He denies any abdominal pain, he denies worsening cough no nausea or vomiting ED Course: Blood pressure was 101 on arrival, this dropped to 70 on standing, labs noted 2.5-3 g drop in hemoglobin from a week ago, chest x-ray noted patchy interstitial opacities unchanged from prior, Hemoccult noted heme positive black stools.  Patient denies any NSAID or EtOH use  Review of Systems: As per HPI otherwise 14 point review of systems negative.   Past Medical History:  Diagnosis Date  . Abnormal gait   . Anemia of chronic disease   . Arthritis    back, hands   . Chronic back pain    radiculopathy and stenosis  . Chronic ischemic heart disease    Severe LVH  . Chronic kidney disease    [name], awaiting Transplant   . Congestive heart failure (CHF) ([place])   . CVA (cerebral infarction) 03/2013   . Diabetes mellitus     Lantus nightly ;type 2  . GERD (gastroesophageal reflux disease)   . H/O hiatal hernia   . History of colon polyps   . Hx of cardiovascular stress test    a.  Lexiscan Myoview (05/2013):  No ischemia, EF 53%; Normal Study  . Hyperlipidemia    takes Lovastatin daily  . Hypertension    takes Amlodipine and Catapres daily    dr Forrestine Him, Loop Recorder - [facility]  . Nocturia   . Obesity   . Peripheral edema    takes Lasix daily  . Sleep apnea    cpap , study in their home, 09/2102- [facility]           . Stroke ([facility])    speech, rt arm weakness  . Urinary frequency     Past Surgical History:  Procedure Laterality Date  . AV FISTULA PLACEMENT Left 04/03/2013   Procedure: ARTERIOVENOUS (AV) FISTULA CREATION- LEFT RADIOCEPHALIC VS BRACHIOCEPHALIC;  Surgeon: Conrad Leslie, MD;  Location: [place];  Service: Vascular;  Laterality: Left;  . AV FISTULA PLACEMENT Left 05/14/2013   Procedure: LEFT BRACHIOCEPHALIC ARTERIOVENOUS (AV) FISTULA CREATION;  Surgeon: Conrad Worthington, MD;  Location: [place];  Service: Vascular;  Laterality: Left;  . BACK SURGERY  12/2012   2003- 1st back surgery & then 2014- fusion  . COLONOSCOPY    . ESOPHAGOGASTRODUODENOSCOPY    . HEMORRHOID SURGERY    . KIDNEY TRANSPLANT  10/01/2013  . left knee surgery    . LOOP RECORDER IMPLANT  03/19/13  MDT LinQ implanted by Dr Rayann Heman for cryptogenic stroke  . LOOP RECORDER IMPLANT N/A 03/19/2013   Procedure: LOOP RECORDER IMPLANT;  Surgeon: Coralyn Mark, MD;  Location: [facility];  Service: Cardiovascular;  Laterality: N/A;  . NEPHRECTOMY TRANSPLANTED ORGAN    . NEUROPLASTY / TRANSPOSITION MEDIAN NERVE AT CARPAL TUNNEL BILATERAL    . right leg surgery     pin in place  . right wrist surgery    . TEE WITHOUT CARDIOVERSION N/A 03/19/2013   Procedure: TRANSESOPHAGEAL ECHOCARDIOGRAM (TEE);  Surgeon: [name], MD;  Location: [facility] ENDOSCOPY;  Service: Cardiovascular;  Laterality: N/A;  . THYROID SURGERY   10/13/2015   Performed at [facility] with surgical pathology revealed consistent with benign follicular nodule ([name] category II)     reports that he quit smoking about 6 years ago. His smoking use included cigarettes. He has a 20.00 pack-year smoking history. He has never used smokeless tobacco. He reports that he does not drink alcohol or use drugs.  Allergies  Allergen Reactions  . Lisinopril Other (See Comments)    Increased potassium level   . Meloxicam Nausea And Vomiting  . Victoza [Liraglutide] Diarrhea, Nausea And Vomiting and Swelling    Family History  Problem Relation Age of Onset  . Hypertension Mother   . Hyperlipidemia Father   . Hypertension Father   . Heart disease Father        before age 25  . Renal Disease Father   . Diabetes Brother   . Hyperlipidemia Son      Prior to Admission medications   Medication Sig Start Date End Date Taking? Authorizing Provider  aspirin EC 81 MG tablet Take 81 mg by mouth daily.    Yes [provider]  atorvastatin (LIPITOR) 20 MG tablet Take 40 mg by mouth at bedtime. 11/26/18  Yes [provider]  calcitRIOL (ROCALTROL) 0.25 MCG capsule Take 0.25 mcg by mouth See admin instructions. Take 1 capsule (0.25 mcg) by mouth five times weekly - Monday thru Friday   Yes [provider]  furosemide (LASIX) 40 MG tablet Take 1 tablet (40 mg total) by mouth daily. 01/11/19 01/11/20 Yes Eugenie Filler, MD  gabapentin (NEURONTIN) 300 MG capsule Take 300 mg by mouth 2 (two) times daily.   Yes [provider]  HYDROcodone-acetaminophen (NORCO) 10-325 MG tablet Take 1-2 tablets by mouth every 4 (four) hours as needed for moderate pain.  12/26/16  Yes [provider]  insulin glargine (LANTUS) 100 unit/mL SOPN Inject 24 Units into the skin at bedtime.    Yes [provider]  insulin lispro (HUMALOG KWIKPEN) 100 UNIT/ML KwikPen Inject 5-15 Units into the skin See admin instructions. Inject 5 units  subcutaneously three times daily before meals adjusted per carb count  ( add 2 units for every 25 points)   Yes [provider]  labetalol (NORMODYNE) 100 MG tablet Take 100 mg by mouth 2 (two) times daily.   Yes [provider]  methimazole (TAPAZOLE) 5 MG tablet Take 2.5 mg by mouth daily.  06/29/17  Yes [provider]  Multiple Vitamin (MULTIVITAMIN WITH MINERALS) TABS tablet Take 1 tablet by mouth daily.   Yes [provider]  mycophenolate (MYFORTIC) 180 MG EC tablet Take 180 mg by mouth 2 (two) times daily.    Yes [provider]  pantoprazole (PROTONIX) 40 MG tablet Take 1 tablet (40 mg total) by mouth daily. 01/11/19  Yes Eugenie Filler, MD  predniSONE (  DELTASONE) 5 MG tablet Take 1 tablet (5 mg total) by mouth daily with breakfast. 01/17/19  Yes Eugenie Filler, MD  [facility] Wort 300 MG TABS Take 300 mg by mouth 3 (three) times daily.   Yes [provider]  tacrolimus (PROGRAF) 1 MG capsule Take 2 mg by mouth 2 (two) times daily. 06/28/17  Yes [provider]  tamsulosin (FLOMAX) 0.4 MG CAPS capsule Take 0.4 mg by mouth at bedtime.   Yes [provider]  TRADJENTA 5 MG TABS tablet Take 5 mg by mouth daily. 04/17/18  Yes [provider]  vitamin B-12 (CYANOCOBALAMIN) 1000 MCG tablet Take 1,000 mcg by mouth 2 (two) times daily.    Yes [provider]  loperamide (IMODIUM) 2 MG capsule Take 1 capsule (2 mg total) by mouth every 12 (twelve) hours. 12/31/18   Nita Sells, MD    Physical Exam: Vitals:   01/20/19 1600 01/20/19 1615 01/20/19 1630 01/20/19 1645  BP: 112/62 113/61 121/66 125/60  Pulse: 91 94 89 85  Resp: 15 15 16 15   Temp:      TempSrc:      SpO2: 97% 92% 95% 94%      Constitutional: Obese elderly male, pleasant sitting up in bed, AAOx3, no distress Vitals:   01/20/19 1600 01/20/19 1615 01/20/19 1630 01/20/19 1645  BP: 112/62 113/61 121/66 125/60  Pulse: 91 94 89 85   Resp: 15 15 16 15   Temp:      TempSrc:      SpO2: 97% 92% 95% 94%   HEENT: Pupils equal reactive oral mucosa moist Respiratory: Diminished breath sounds bilaterally, Cardiovascular: S1-S2, regular rate rhythm Abdomen: soft, non tender, Bowel sounds positive.  Musculoskeletal: No joint deformity upper and lower extremities. Ext: No edema Skin: no rashes, lesions, ulcers.  Neurologic: Moves all extremities, no localizing signs Psychiatric: Flat affect  Labs on Admission: I have personally reviewed following labs and imaging studies  CBC: Recent Labs  Lab 01/20/19 1343  WBC 12.8*  HGB 8.5*  HCT 27.2*  MCV 97.1  PLT 269   Basic Metabolic Panel: Recent Labs  Lab 01/20/19 1343  NA 142  K 4.3  CL 109  CO2 24  GLUCOSE 100*  BUN 53*  CREATININE 2.04*  CALCIUM 8.6*   GFR: Estimated Creatinine Clearance: 42.4 mL/min (A) (by C-G formula based on SCr of 2.04 mg/dL (H)). Liver Function Tests: Recent Labs  Lab 01/20/19 1446  AST 21  ALT 35  ALKPHOS 55  BILITOT 0.7  PROT 5.4*  ALBUMIN 2.5*   No results for input(s): LIPASE, AMYLASE in the last 168 hours. No results for input(s): AMMONIA in the last 168 hours. Coagulation Profile: No results for input(s): INR, PROTIME in the last 168 hours. Cardiac Enzymes: No results for input(s): CKTOTAL, CKMB, CKMBINDEX, TROPONINI in the last 168 hours. BNP (last 3 results) No results for input(s): PROBNP in the last 8760 hours. HbA1C: No results for input(s): HGBA1C in the last 72 hours. CBG: No results for input(s): GLUCAP in the last 168 hours. Lipid Profile: No results for input(s): CHOL, HDL, LDLCALC, TRIG, CHOLHDL, LDLDIRECT in the last 72 hours. Thyroid Function Tests: No results for input(s): TSH, T4TOTAL, FREET4, T3FREE, THYROIDAB in the last 72 hours. Anemia Panel: No results for input(s): VITAMINB12, FOLATE, FERRITIN, TIBC, IRON, RETICCTPCT in the last 72 hours. Urine analysis:    Component Value Date/Time    COLORURINE YELLOW 12/28/2018 0300   APPEARANCEUR HAZY (A) 12/28/2018 0300  LABSPEC 1.019 12/28/2018 0300   PHURINE 5.0 12/28/2018 0300   GLUCOSEU NEGATIVE 12/28/2018 0300   HGBUR SMALL (A) 12/28/2018 0300   BILIRUBINUR NEGATIVE 12/28/2018 0300   KETONESUR NEGATIVE 12/28/2018 0300   PROTEINUR 30 (A) 12/28/2018 0300   UROBILINOGEN 0.2 01/23/2011 0551   NITRITE NEGATIVE 12/28/2018 0300   LEUKOCYTESUR NEGATIVE 12/28/2018 0300   Sepsis Labs: @LABRCNTIP (procalcitonin:4,lacticidven:4) )No results found for this or any previous visit (from the past 240 hour(s)).   Radiological Exams on Admission: DG Chest Port 1 View  Result Date: 01/20/2019 CLINICAL DATA:  Weakness.  Low O2 sats. EXAM: PORTABLE CHEST 1 VIEW COMPARISON:  01/04/2019 FINDINGS: Heart is borderline in size. Patchy interstitial and airspace opacities within the lungs. Findings concerning for pneumonia. No effusions or pneumothorax. No acute bony abnormality. IMPRESSION: Patchy bilateral interstitial and airspace opacities are similar to prior study and concerning for pneumonia. Electronically Signed   By: Rolm Baptise M.D.   On: 01/20/2019 16:07     Assessment/Plan  Upper GI bleed/melena Acute blood loss anemia -Suspect peptic ulcer disease or gastritis secondary to recent increase steroid use, stress etc- now back down to basal dose of prednisone 5 mg daily which will be continued -IV PPI -Will transfuse 1 unit of PRBC today due to significant symptomatic anemia with hypotension  -[facility] consulted, will allow clears now and keep pt, Npo after midnight  CKD stage IV/status post cadaveric renal transplant in 2015 -Baseline creatinine is 2-2.5 -Kidney function is stable today, monitor volume status closely -Continue home regimen of immunosuppressants, hold Lasix today  Type 2 diabetes mellitus with hyperglycemia -Recent hemoglobin A1c was 7.8 -Continue Lantus will decrease dose as he will be n.p.o. after midnight,  sliding scale insulin  Recent Covid pneumonia -originally diagnosed with COVID-19 on 12/17 -Recently treated with IV remdesivir and Decadron -Improved from that standpoint, given his chronically immunosuppressed state he would need a 4-week course of isolation which will be completed on 1/14 -Chest x-ray stable -Continue airborne/contact precautions until 1/14  Hyperthyroidism -Continue methimazole  History of CVA -Hold aspirin  DVT prophylaxis: SCDs Code Status: Full code per patient's wishes Family Communication: No family at bedside, discussed the patient Disposition Plan: Home pending resolution of GI bleed Consults called: [facility] Admission status: Inpatient  Domenic Polite MD [facility]   01/20/2019, 5:34 PM

## 2019-01-20 NOTE — ED Notes (Signed)
Pt calm and waiting for IV team to receive blood

## 2019-01-20 NOTE — ED Notes (Signed)
Still awaiting IV team arrival for second site for blood admin

## 2019-01-20 NOTE — ED Notes (Signed)
Attempted a 2nd line on pt. Difficult stick restricted left arm. IV team consult ordered STAT. Blood is ready in blood bank when IV is established.

## 2019-01-20 NOTE — ED Triage Notes (Signed)
To triage via EMS from home.  Pt checked BP at home 70/40.  Upon EMS arrival BP 100/50 and then 141 systolic.  Pt states weakness x 2 weeks.   Covid x 1 month, tested negative 1 week ago.  Oxygen sats 86-88%, EMS placed on oxygen 2L via [place].  No known exposure to covid.  No covid symptoms.

## 2019-01-20 NOTE — ED Provider Notes (Addendum)
[facility] EMERGENCY DEPARTMENT Provider Note   CSN: [id number] Arrival date & time: 01/20/19  1311     History Chief Complaint  Patient presents with  . Weakness    Jon [name] is a 69 y.o. male with PMHx HTN, HLD, GERD, diabetes, CHF with EF 60-65% (06/08/2018), CVA currently on 81 mg ASA, anemia of chronic disease, who presents to the ED today with complaint of generalized weakness/fatigue x 1 week.   Per chart review: Pt was seen in the ED on 12/17 with complaint of SOB, fever, GI symptoms and found to be COVID positive. He had an AKI and given hx of renal transplant in 2015 was admitted. Pt was discharged home on 12/21; advised to hold his Lasix and Bactrim for 1 week until he had a repeat BMP done.   Pt returned to the ED on 12/25 for worsening shortness of breath with oxygen saturation 83% on RA. Pt had an AKI at that time with elevation in potassium at 5.7. His chest xray showed concern for increased interstitial markings consistent with COVID 19. Pt was admitted due to his respiratory failure with hypoxia and new oxygen requirement. Pt was discharged home on 01/11/2019 after improvement of his hypoxia with Remdesivir and Decadron. Pt was discharged home on a steroid taper pack and then continuation of his usual prednisone 5 mg daily prescription afterwards.   He reports that he felt fine upon discharge but for the past weak has felt more weak and fatigued. Pt states he noticed that his blood pressures have been low for the past week as well. He saw his PCP 2 days ago and his Labetolol was decreased to 50 mg from 100 mg without resolution of his soft blood pressures. Pt states he felt so weak today when standing and had blurry vision that he decided to come to the ED. He states he has had a mild cough but does not feel short of breath any longer. Denies chest pain, abdominal pain, nausea, vomiting, diarrhea, constipation, or any other associated symptoms. Pt does  mention he noticed dark colored stools 2 days ago but it resolved today and he did not think much of it. No hx of GI bleed.   The history is provided by the patient, medical records and the spouse.       Past Medical History:  Diagnosis Date  . Abnormal gait   . Anemia of chronic disease   . Arthritis    back, hands   . Chronic back pain    radiculopathy and stenosis  . Chronic ischemic heart disease    Severe LVH  . Chronic kidney disease    [name], awaiting Transplant   . Congestive heart failure (CHF) ([place])   . CVA (cerebral infarction) 03/2013  . Diabetes mellitus     Lantus nightly ;type 2  . GERD (gastroesophageal reflux disease)   . H/O hiatal hernia   . History of colon polyps   . Hx of cardiovascular stress test    a.  Lexiscan Myoview (05/2013):  No ischemia, EF 53%; Normal Study  . Hyperlipidemia    takes Lovastatin daily  . Hypertension    takes Amlodipine and Catapres daily    dr Forrestine Him, Loop Recorder - [facility]  . Nocturia   . Obesity   . Peripheral edema    takes Lasix daily  . Sleep apnea    cpap , study in their home, 09/2102- [facility]           .  Stroke (HCC)    speech, rt arm weakness  . Urinary frequency     Patient Active Problem List   Diagnosis Date Noted  . Upper GI bleed 01/20/2019  . GI bleed 01/20/2019  . Elevated LFTs   . Pneumonia due to COVID-19 virus   . Cerebrovascular accident (CVA) ([place])   . Acute kidney injury superimposed on chronic kidney disease ([place]) 01/04/2019  . Acute respiratory failure with hypoxia ([place]) 01/04/2019  . AKI (acute kidney injury) ([place]) 12/27/2018  . COVID-19 virus infection   . Ascending aortic aneurysm ([place]) 10/23/2017  . Arteriovenous fistula for hemodialysis in place, primary ([place]) 08/08/2017  . Cerebral infarction ([place]) 08/08/2017  . Osteoarthritis (arthritis due to wear and tear of joints) 08/08/2017  . Acute renal failure superimposed on stage 4 chronic kidney disease ([place]) 06/04/2017  .  GERD (gastroesophageal reflux disease) 06/04/2017  . Pulmonary hypertension, unspecified ([place]) 01/23/2017  . Peripheral edema 01/02/2017  . Acute right ankle pain 12/08/2016  . Fungal intestinal infection following organ transplantation 11/29/2016  . Finger infection, fungal 11/22/2016  . Mass of soft tissue of left upper extremity 10/18/2016  . Scapholunate dissociation of left wrist 10/18/2016  . Digital mucinous cyst of finger of left hand 10/10/2016  . Dyspnea on exertion 08/24/2016  . Swelling of lower extremity 06/30/2016  . Hypertension associated with transplantation 12/03/2014  . URI, acute 03/07/2014  . BK viremia 02/07/2014  . Type 2 diabetes mellitus without complications ([place]) 42/70/6237  . Hypomagnesemia 10/25/2013  . Elevated serum creatinine 10/24/2013  . Immunosuppressive management encounter following kidney transplant 10/21/2013  . Immunosuppression ([place]) 10/14/2013  . Pain at surgical site 10/01/2013  . HLD (hyperlipidemia) 05/02/2013  . OSA (obstructive sleep apnea) 05/02/2013  . History of stroke 03/16/2013  . Movement disorder 03/16/2013  . Lumbar stenosis with neurogenic claudication 12/27/2012  . Chronic back pain 01/27/2011  . HTN (hypertension) 01/27/2011  . History of kidney transplant 01/24/2011  . Hyperkalemia 01/23/2011  . Diabetes mellitus ([place]) 01/23/2011    Past Surgical History:  Procedure Laterality Date  . AV FISTULA PLACEMENT Left 04/03/2013   Procedure: ARTERIOVENOUS (AV) FISTULA CREATION- LEFT RADIOCEPHALIC VS BRACHIOCEPHALIC;  Surgeon: Conrad Stone, MD;  Location: [facility];  Service: Vascular;  Laterality: Left;  . AV FISTULA PLACEMENT Left 05/14/2013   Procedure: LEFT BRACHIOCEPHALIC ARTERIOVENOUS (AV) FISTULA CREATION;  Surgeon: Conrad [place], MD;  Location: [facility];  Service: Vascular;  Laterality: Left;  . BACK SURGERY  12/2012   2003- 1st back surgery & then 2014- fusion  . COLONOSCOPY    . ESOPHAGOGASTRODUODENOSCOPY    . HEMORRHOID  SURGERY    . KIDNEY TRANSPLANT  10/01/2013  . left knee surgery    . LOOP RECORDER IMPLANT  03/19/13   MDT LinQ implanted by Dr Rayann Heman for cryptogenic stroke  . LOOP RECORDER IMPLANT N/A 03/19/2013   Procedure: LOOP RECORDER IMPLANT;  Surgeon: Coralyn Mark, MD;  Location: [facility] CATH LAB;  Service: Cardiovascular;  Laterality: N/A;  . NEPHRECTOMY TRANSPLANTED ORGAN    . NEUROPLASTY / TRANSPOSITION MEDIAN NERVE AT CARPAL TUNNEL BILATERAL    . right leg surgery     pin in place  . right wrist surgery    . TEE WITHOUT CARDIOVERSION N/A 03/19/2013   Procedure: TRANSESOPHAGEAL ECHOCARDIOGRAM (TEE);  Surgeon: [name], MD;  Location: [facility] ENDOSCOPY;  Service: Cardiovascular;  Laterality: N/A;  . THYROID SURGERY  10/13/2015   Performed at [facility] with surgical pathology revealed consistent with benign  follicular nodule ([name] category II)       Family History  Problem Relation Age of Onset  . Hypertension Mother   . Hyperlipidemia Father   . Hypertension Father   . Heart disease Father        before age 101  . Renal Disease Father   . Diabetes Brother   . Hyperlipidemia Son     Social History   Tobacco Use  . Smoking status: Former Smoker    Packs/day: 0.50    Years: 40.00    Pack years: 20.00    Types: Cigarettes    Quit date: 12/27/2012    Years since quitting: 6.0  . Smokeless tobacco: Never Used  Substance Use Topics  . Alcohol use: No    Alcohol/week: 0.0 standard drinks  . Drug use: No    Home Medications Prior to Admission medications   Medication Sig Start Date End Date Taking? Authorizing Provider  aspirin EC 81 MG tablet Take 81 mg by mouth daily.    Yes [provider]  atorvastatin (LIPITOR) 20 MG tablet Take 40 mg by mouth at bedtime. 11/26/18  Yes [provider]  calcitRIOL (ROCALTROL) 0.25 MCG capsule Take 0.25 mcg by mouth See admin instructions. Take 1 capsule (0.25 mcg) by mouth five times weekly - Monday thru Friday   Yes [provider]  furosemide (LASIX) 40 MG tablet Take 1 tablet (40 mg total) by mouth daily. 01/11/19 01/11/20 Yes Eugenie Filler, MD  gabapentin (NEURONTIN) 300 MG capsule Take 300 mg by mouth 2 (two) times daily.   Yes [provider]  HYDROcodone-acetaminophen (NORCO) 10-325 MG tablet Take 1-2 tablets by mouth every 4 (four) hours as needed for moderate pain.  12/26/16  Yes [provider]  insulin glargine (LANTUS) 100 unit/mL SOPN Inject 24 Units into the skin at bedtime.    Yes [provider]  insulin lispro (HUMALOG KWIKPEN) 100 UNIT/ML KwikPen Inject 5-15 Units into the skin See admin instructions. Inject 5 units subcutaneously three times daily before meals adjusted per carb count  ( add 2 units for every 25 points)   Yes [provider]  labetalol (NORMODYNE) 100 MG tablet Take 100 mg by mouth 2 (two) times daily.   Yes [provider]  methimazole (TAPAZOLE) 5 MG tablet Take 2.5 mg by mouth daily.  06/29/17  Yes [provider]  Multiple Vitamin (MULTIVITAMIN WITH MINERALS) TABS tablet Take 1 tablet by mouth daily.   Yes [provider]  mycophenolate (MYFORTIC) 180 MG EC tablet Take 180 mg by mouth 2 (two) times daily.    Yes [provider]  pantoprazole (PROTONIX) 40 MG tablet Take 1 tablet (40 mg total) by mouth daily. 01/11/19  Yes Eugenie Filler, MD  predniSONE (DELTASONE) 5 MG tablet Take 1 tablet (5 mg total) by mouth daily with breakfast. 01/17/19  Yes Eugenie Filler, MD  [facility] Wort 300 MG TABS Take 300 mg by mouth 3 (three) times daily.   Yes [provider]  tacrolimus (PROGRAF) 1 MG capsule Take 2 mg by mouth 2 (two) times daily. 06/28/17  Yes [provider]  tamsulosin (FLOMAX) 0.4 MG CAPS capsule Take 0.4 mg by mouth at bedtime.   Yes [provider]  TRADJENTA 5 MG TABS tablet Take 5 mg by mouth daily. 04/17/18  Yes [provider]  vitamin B-12 (CYANOCOBALAMIN)  1000 MCG tablet Take 1,000 mcg by mouth 2 (two) times daily.  Yes [provider]  loperamide (IMODIUM) 2 MG capsule Take 1 capsule (2 mg total) by mouth every 12 (twelve) hours. 12/31/18   Nita Sells, MD    Allergies    Lisinopril, Meloxicam, and Victoza [liraglutide]  Review of Systems   Review of Systems  Constitutional: Positive for fatigue. Negative for chills and fever.  Eyes: Negative for visual disturbance.  Respiratory: Negative for shortness of breath.   Cardiovascular: Negative for chest pain.  Gastrointestinal: Positive for blood in stool. Negative for abdominal pain, diarrhea, nausea and vomiting.  All other systems reviewed and are negative.   Physical Exam Updated Vital Signs BP 125/60   Pulse 85   Temp 98.2 F (36.8 C) (Oral)   Resp 15   SpO2 94%   Physical Exam Vitals and nursing note reviewed.  Constitutional:      Appearance: He is not ill-appearing.  HENT:     Head: Normocephalic and atraumatic.  Eyes:     Conjunctiva/sclera: Conjunctivae normal.  Cardiovascular:     Rate and Rhythm: Regular rhythm. Tachycardia present.  Pulmonary:     Effort: Pulmonary effort is normal.     Breath sounds: Normal breath sounds. No wheezing, rhonchi or rales.     Comments: Satting 94% on RA.  Chest:     Chest wall: No tenderness.  Abdominal:     Palpations: Abdomen is soft.     Tenderness: There is no abdominal tenderness. There is no guarding or rebound.  Genitourinary:    Rectum: Guaiac result positive.     Comments: Chaperone present Glendon Axe, RN. Good rectal tone. No thrombosed external hemorrhoids. Grossly melanotic stool on gloved finger.  Musculoskeletal:     Cervical back: Neck supple.  Skin:    General: Skin is warm and dry.  Neurological:     Mental Status: He is alert.     ED Results / Procedures / Treatments   Labs (all labs ordered are listed, but only abnormal results are displayed) Labs Reviewed  BASIC METABOLIC  PANEL - Abnormal; Notable for the following components:      Result Value   Glucose, Bld 100 (*)    BUN 53 (*)    Creatinine, Ser 2.04 (*)    Calcium 8.6 (*)    GFR calc non Af Amer 33 (*)    GFR calc Af Amer 38 (*)    All other components within normal limits  CBC - Abnormal; Notable for the following components:   WBC 12.8 (*)    RBC 2.80 (*)    Hemoglobin 8.5 (*)    HCT 27.2 (*)    All other components within normal limits  HEPATIC FUNCTION PANEL - Abnormal; Notable for the following components:   Total Protein 5.4 (*)    Albumin 2.5 (*)    All other components within normal limits  POC OCCULT BLOOD, ED - Abnormal; Notable for the following components:   Fecal Occult Bld POSITIVE (*)    All other components within normal limits  TROPONIN I (HIGH SENSITIVITY) - Abnormal; Notable for the following components:   Troponin I (High Sensitivity) 19 (*)    All other components within normal limits  LACTIC ACID, PLASMA  PROTIME-INR  CBC  COMPREHENSIVE METABOLIC PANEL  CBG MONITORING, ED  TYPE AND SCREEN  PREPARE RBC (CROSSMATCH)    EKG EKG Interpretation  Date/Time:  Sunday January 20 2019 13:24:04 EST Ventricular Rate:  119 PR Interval:  146 QRS Duration: 78 QT Interval:  316 QTC Calculation: 444 R Axis:   1 Text Interpretation: Sinus tachycardia Otherwise normal ECG Since last tracing rate faster Confirmed by Isla Pence [phone]) on 01/20/2019 3:16:39 PM   Radiology DG Chest Port 1 View  Result Date: 01/20/2019 CLINICAL DATA:  Weakness.  Low O2 sats. EXAM: PORTABLE CHEST 1 VIEW COMPARISON:  01/04/2019 FINDINGS: Heart is borderline in size. Patchy interstitial and airspace opacities within the lungs. Findings concerning for pneumonia. No effusions or pneumothorax. No acute bony abnormality. IMPRESSION: Patchy bilateral interstitial and airspace opacities are similar to prior study and concerning for pneumonia. Electronically Signed   By: Rolm Baptise M.D.   On:  01/20/2019 16:07    Procedures .Critical Care Performed by: Eustaquio Maize, PA-C Authorized by: Eustaquio Maize, PA-C   Critical care provider statement:    Critical care time (minutes):  45   Critical care was time spent personally by me on the following activities:  Discussions with consultants, evaluation of patient's response to treatment, examination of patient, ordering and performing treatments and interventions, ordering and review of laboratory studies, ordering and review of radiographic studies, pulse oximetry, re-evaluation of patient's condition, obtaining history from patient or surrogate and review of old charts   (including critical care time)  Medications Ordered in ED Medications  pantoprazole (PROTONIX) 80 mg in sodium chloride 0.9 % 250 mL (0.32 mg/mL) infusion (8 mg/hr Intravenous New Bag/Given 01/20/19 1605)  HYDROcodone-acetaminophen (NORCO) 10-325 MG per tablet 1-2 tablet (has no administration in time range)  atorvastatin (LIPITOR) tablet 40 mg (has no administration in time range)  labetalol (NORMODYNE) tablet 50 mg (has no administration in time range)  insulin glargine (LANTUS) Solostar Pen 12 Units (has no administration in time range)  methimazole (TAPAZOLE) tablet 2.5 mg (has no administration in time range)  predniSONE (DELTASONE) tablet 5 mg (has no administration in time range)  tamsulosin (FLOMAX) capsule 0.4 mg (has no administration in time range)  mycophenolate (MYFORTIC) EC tablet 180 mg (has no administration in time range)  tacrolimus (PROGRAF) capsule 2 mg (has no administration in time range)  gabapentin (NEURONTIN) capsule 300 mg (has no administration in time range)  multivitamin with minerals tablet 1 tablet (has no administration in time range)  acetaminophen (TYLENOL) tablet 650 mg (has no administration in time range)    Or  acetaminophen (TYLENOL) suppository 650 mg (has no administration in time range)  ondansetron (ZOFRAN) tablet 4 mg  (has no administration in time range)    Or  ondansetron (ZOFRAN) injection 4 mg (has no administration in time range)  insulin aspart (novoLOG) injection 0-9 Units (has no administration in time range)  0.9 %  sodium chloride infusion (Manually program via Guardrails IV Fluids) (has no administration in time range)  pantoprazole (PROTONIX) 80 mg in sodium chloride 0.9 % 100 mL IVPB (0 mg Intravenous Stopped 01/20/19 1630)  sodium chloride 0.9 % bolus 500 mL (0 mLs Intravenous Stopped 01/20/19 1649)    ED Course  I have reviewed the triage vital signs and the nursing notes.  Pertinent labs & imaging results that were available during my care of the patient were reviewed by me and considered in my medical decision making (see chart for details).  69 year old male presents the ED today complaining of generalized weakness/fatigue for the past week with low blood pressures.  His labetalol was decreased from 100 mg to 50 mg 2 days ago without improvement.  Took his blood pressure this morning and noticed that it was 70/40.  Upon  EMS arrival blood pressure 100/50.  He did not receive any fluids in route.  On arrival to the ED blood pressure 101/51.  Work was obtained while patient was in the waiting room.  Hemoglobin 8.5 compared to 11 9 days ago at time of discharge.  She was prescribed steroid dose taper pack at time of discharge on 1/1.  On rectal exam patient has grossly melanotic stool which is fecal occult positive.  Start IV Protonix drip.  Patient will be given fluids as orthostatics are positive.  BC mildly elevated at 12,000.  Chest x-ray does show signs of pneumonia although recently had Covid diagnosis on 12/17, suspect that this is residual changes.  Patient denies any increase in shortness of breath or cough after being discharged.  He is afebrile on arrival.  Will hold off on antibiotics at this time.  Lactic acid within normal limits as well.   BMP with stable creatinine compared to baseline.   No increase in LFTs today.  Troponin initially 19, will repeat.  Will consult GI.  And plan for admission with hospitalist.   Clinical Course as of Jan 20 1747  Sun Jan 20, 2019  1646 Discussed case with GI Dr. Hilarie Fredrickson who does not recommend transfusing unless Hgb < 8.0, will need to continue to monitor. Will be evaluated by GI likely in the morning.    [MV]  1721 Discussed case with Dr. Broadus John who agrees to accept patient for admission   [MV]    Clinical Course User Index [MV] Eustaquio Maize, PA-C   This note was prepared using Dragon voice recognition software and may include unintentional dictation errors due to the inherent limitations of voice recognition software.  MDM Rules/Calculators/A&P                       Final Clinical Impression(s) / ED Diagnoses Final diagnoses:  Upper GI bleed  Dehydration  Weakness    Rx / DC Orders ED Discharge Orders    None           Isla Pence, MD 01/20/19 1747    Eustaquio Maize, PA-C 01/20/19 1749    Isla Pence, MD 01/21/19 [phone]

## 2019-01-21 ENCOUNTER — Encounter (HOSPITAL_COMMUNITY): Payer: Self-pay | Admitting: Internal Medicine

## 2019-01-21 DIAGNOSIS — Z940 Kidney transplant status: Secondary | ICD-10-CM

## 2019-01-21 DIAGNOSIS — K921 Melena: Secondary | ICD-10-CM | POA: Insufficient documentation

## 2019-01-21 DIAGNOSIS — K922 Gastrointestinal hemorrhage, unspecified: Secondary | ICD-10-CM

## 2019-01-21 DIAGNOSIS — Z8616 Personal history of COVID-19: Secondary | ICD-10-CM | POA: Insufficient documentation

## 2019-01-21 LAB — COMPREHENSIVE METABOLIC PANEL
ALT: 27 U/L (ref 0–44)
AST: 16 U/L (ref 15–41)
Albumin: 2.1 g/dL — ABNORMAL LOW (ref 3.5–5.0)
Alkaline Phosphatase: 41 U/L (ref 38–126)
Anion gap: 9 (ref 5–15)
BUN: 55 mg/dL — ABNORMAL HIGH (ref 8–23)
CO2: 21 mmol/L — ABNORMAL LOW (ref 22–32)
Calcium: 7.9 mg/dL — ABNORMAL LOW (ref 8.9–10.3)
Chloride: 110 mmol/L (ref 98–111)
Creatinine, Ser: 1.93 mg/dL — ABNORMAL HIGH (ref 0.61–1.24)
GFR calc Af Amer: 41 mL/min — ABNORMAL LOW (ref >60)
GFR calc non Af Amer: 35 mL/min — ABNORMAL LOW (ref >60)
Glucose, Bld: 158 mg/dL — ABNORMAL HIGH (ref 70–99)
Potassium: 4.7 mmol/L (ref 3.5–5.1)
Sodium: 140 mmol/L (ref 135–145)
Total Bilirubin: 0.6 mg/dL (ref 0.3–1.2)
Total Protein: 4.4 g/dL — ABNORMAL LOW (ref 6.5–8.1)

## 2019-01-21 LAB — CBC
HCT: 25.2 % — ABNORMAL LOW (ref 39.0–52.0)
HCT: 24.6 % — ABNORMAL LOW (ref 39.0–52.0)
HCT: 20.7 % — ABNORMAL LOW (ref 39.0–52.0)
Hemoglobin: 8 g/dL — ABNORMAL LOW (ref 13.0–17.0)
Hemoglobin: 7.8 g/dL — ABNORMAL LOW (ref 13.0–17.0)
Hemoglobin: 6.6 g/dL — CL (ref 13.0–17.0)
MCH: 30.7 pg (ref 26.0–34.0)
MCH: 30.7 pg (ref 26.0–34.0)
MCH: 30.8 pg (ref 26.0–34.0)
MCHC: 31.7 g/dL (ref 30.0–36.0)
MCHC: 31.7 g/dL (ref 30.0–36.0)
MCHC: 31.9 g/dL (ref 30.0–36.0)
MCV: 96.6 fL (ref 80.0–100.0)
MCV: 96.9 fL (ref 80.0–100.0)
MCV: 96.7 fL (ref 80.0–100.0)
Platelets: 124 10*3/uL — ABNORMAL LOW (ref 150–400)
Platelets: 139 10*3/uL — ABNORMAL LOW (ref 150–400)
Platelets: 147 10*3/uL — ABNORMAL LOW (ref 150–400)
RBC: 2.61 MIL/uL — ABNORMAL LOW (ref 4.22–5.81)
RBC: 2.54 MIL/uL — ABNORMAL LOW (ref 4.22–5.81)
RBC: 2.14 MIL/uL — ABNORMAL LOW (ref 4.22–5.81)
RDW: 15.2 % (ref 11.5–15.5)
RDW: 15.2 % (ref 11.5–15.5)
RDW: 15.4 % (ref 11.5–15.5)
WBC: 8.4 10*3/uL (ref 4.0–10.5)
WBC: 10.6 10*3/uL — ABNORMAL HIGH (ref 4.0–10.5)
WBC: 12.5 10*3/uL — ABNORMAL HIGH (ref 4.0–10.5)
nRBC: 0 % (ref 0.0–0.2)
nRBC: 0 % (ref 0.0–0.2)
nRBC: 0 % (ref 0.0–0.2)

## 2019-01-21 LAB — BASIC METABOLIC PANEL
Anion gap: 9 (ref 5–15)
BUN: 63 mg/dL — ABNORMAL HIGH (ref 8–23)
CO2: 19 mmol/L — ABNORMAL LOW (ref 22–32)
Calcium: 7.6 mg/dL — ABNORMAL LOW (ref 8.9–10.3)
Chloride: 111 mmol/L (ref 98–111)
Creatinine, Ser: 2.1 mg/dL — ABNORMAL HIGH (ref 0.61–1.24)
GFR calc Af Amer: 37 mL/min — ABNORMAL LOW (ref >60)
GFR calc non Af Amer: 32 mL/min — ABNORMAL LOW (ref >60)
Glucose, Bld: 235 mg/dL — ABNORMAL HIGH (ref 70–99)
Potassium: 4.7 mmol/L (ref 3.5–5.1)
Sodium: 139 mmol/L (ref 135–145)

## 2019-01-21 LAB — LACTIC ACID, PLASMA: Lactic Acid, Venous: 2.7 mmol/L (ref 0.5–1.9)

## 2019-01-21 LAB — PROTIME-INR
INR: 1.1 (ref 0.8–1.2)
Prothrombin Time: 14.3 seconds (ref 11.4–15.2)

## 2019-01-21 LAB — CBG MONITORING, ED
Glucose-Capillary: 172 mg/dL — ABNORMAL HIGH (ref 70–99)
Glucose-Capillary: 176 mg/dL — ABNORMAL HIGH (ref 70–99)

## 2019-01-21 LAB — GLUCOSE, CAPILLARY
Glucose-Capillary: 158 mg/dL — ABNORMAL HIGH (ref 70–99)
Glucose-Capillary: 239 mg/dL — ABNORMAL HIGH (ref 70–99)
Glucose-Capillary: 214 mg/dL — ABNORMAL HIGH (ref 70–99)

## 2019-01-21 LAB — PREPARE RBC (CROSSMATCH)

## 2019-01-21 MED ORDER — SODIUM CHLORIDE 0.9 % IV BOLUS
250.0000 mL | Freq: Once | INTRAVENOUS | Status: AC
  Administered 2019-01-21: 250 mL via INTRAVENOUS

## 2019-01-21 MED ORDER — SODIUM CHLORIDE 0.9% IV SOLUTION: Freq: Once | INTRAVENOUS | Status: AC

## 2019-01-21 MED ORDER — GABAPENTIN 300 MG PO CAPS
300.0000 mg | ORAL_CAPSULE | Freq: Every day | ORAL | Status: DC
  Administered 2019-01-22 – 2019-01-26 (×5): 300 mg via ORAL
  Filled 2019-01-21 (×5): qty 1

## 2019-01-21 NOTE — Progress Notes (Addendum)
Pt up to bedside toilet, once pt sat down, pt loss consciousness for several seconds. Unable to arouse, very pale in color, agonal breathing. Once RN was able to get pt back in bed pt had a large  tarry black, clot filled bowel movement. Pt Mews is red. Np paged, RR notified. Stat labs ordered.

## 2019-01-21 NOTE — ED Notes (Signed)
Pt with moderate amount of dark stool mixed with blood

## 2019-01-21 NOTE — ED Notes (Signed)
Pt sitting up in bed, eating his clear liquid diet. Pt states he has no needs. Blood infusing. Call light within reach, brakes locked, bed in lowest position. No acute disress, vital signs stable at this time.

## 2019-01-21 NOTE — Progress Notes (Addendum)
CRITICAL VALUE ALERT  Critical Value:  Lactic Acid 2.7  Date & Time Notied:  10:34p 01/21/2019  Provider Notified: Sharlet Salina   Orders Received/Actions taken:

## 2019-01-21 NOTE — Consult Note (Signed)
[facility] Consult: 10:05 AM 01/21/2019  LOS: 1 day    Referring Provider: Dr Broadus John  Primary Care Physician:  [name], MD in [place] Primary Gastroenterologist:  Dr. Stacie Glaze in [place] did colonoscopy 2015. Dr. Lyndel Safe 2019.    Reason for Consultation:  Black stool.  Weakness,  Anemia.     HPI: Jon [name] is a 69 y.o. male s/p R renal transplant (2015 at [facility]).  On chronic immunosuppression w Myfortic, tacrolimus, prednisone.  Currently stage IV CKD.  Ischemic heart disease. S/p implanted loop recorder.   Obesity.  Pulmonary hypertension.  Obstructive sleep apnea.  DM 2, insulin requiring.  2015 CVA.  Nephrolithiasis. S/p thyroidectomy, on Tapazole.  Daily low dose ASA.   S/p hemorrhoidectomy.    04/2013 screening colonoscopy.  Dr. Alean Rinne.  Several small rectal polyps cold snare removed.  Pathology showed hyperplastic polyps 09/2017 EGD.  Dr Lyndel Safe, for dysphagia, wt loss.  Erosive gastritis, tortuous esophagus was dilated.  No esophageal stricture.   Evaluated at GI office 08/11/2017 for dysphagia and EGD, possible dilation was planned.  2 recent admits: 12/17 -12/31/2018 with COVID-19 without pneumonia.  Brief treatment with Decadron but rapidly switch back to his chronic home prednisone dose. Patient was felt to have coronavirus related diarrhea.  Stool pathogen, C. Difficile all negative.  Discharged on Lomotil. Readmitted 01/03/2019-01/11/2019 COVID-19 pneumonia vs fluid overload, hypoxia, AKI, elevated LFTs.  He received remdesivir x 5 days, Decadron.  Discharged home on prednisone taper down to his normal 5 mg dose.  Room air sats 94 to 95%.  The LFTs (T bili, alk phos normal.  AST/ALT max 85/106 and improved during course of admission) were worked up with ultrasound which showed  coarse liver texture c/w steatosis and/or chronic hepatocellular disease.  Readmitted from ED yesterday with hypotension, weakness.  Dizziness, DOE, weakness present for the past couple of days.  Has seen black stools in recent days.  No abdominal pain, nausea, vomiting, progressive cough.  Diarrhea which was felt COVID related has resolved.  He reports having stopped the Lomotil.  He has not seen red blood in his stools.  Denies hematemesis.  Hgb 8.5 >> 8. Hgb 11.2 on 01/11/19.   MCV 96.  Platelets 124.  No current INR but it was 1.1 on 12/27/2018 FOBT +/   AKI.  LFTs normal.  Presented with weakness, dizziness, dyspnea on exertion and a few days of black stools without hematemesis, hematochezia, abdominal pain.   Orthostatic in the ED. Hgb 8.5 >> 8.  MCV 96.  Hgb was 11 on 01/11/2019 Saved 1 of 1 PRBCs today. Platelets declining.  124 yesterday, 181 on 01/11/2019 No current INR.  It was normal on 12/27/2018 FOBT positive black stools on DRE in the emergency department.    Past Medical History:  Diagnosis Date  . Abnormal gait   . Anemia of chronic disease   . Arthritis    back, hands   . Chronic back pain    radiculopathy and stenosis  . Chronic ischemic heart disease  Severe LVH  . Chronic kidney disease    [name], awaiting Transplant   . Congestive heart failure (CHF) ([place])   . CVA (cerebral infarction) 03/2013  . Diabetes mellitus     Lantus nightly ;type 2  . GERD (gastroesophageal reflux disease)   . H/O hiatal hernia   . History of colon polyps   . Hx of cardiovascular stress test    a.  Lexiscan Myoview (05/2013):  No ischemia, EF 53%; Normal Study  . Hyperlipidemia    takes Lovastatin daily  . Hypertension    takes Amlodipine and Catapres daily    dr Forrestine Him, Loop Recorder - [facility]  . Nocturia   . Obesity   . Peripheral edema    takes Lasix daily  . Sleep apnea    cpap , study in their home, 09/2102- [employer]           . Stroke (HCC)    speech, rt arm  weakness  . Urinary frequency     Past Surgical History:  Procedure Laterality Date  . AV FISTULA PLACEMENT Left 04/03/2013   Procedure: ARTERIOVENOUS (AV) FISTULA CREATION- LEFT RADIOCEPHALIC VS BRACHIOCEPHALIC;  Surgeon: Conrad Eufaula, MD;  Location: [facility];  Service: Vascular;  Laterality: Left;  . AV FISTULA PLACEMENT Left 05/14/2013   Procedure: LEFT BRACHIOCEPHALIC ARTERIOVENOUS (AV) FISTULA CREATION;  Surgeon: Conrad [place]ita, MD;  Location: [facility];  Service: Vascular;  Laterality: Left;  . BACK SURGERY  12/2012   2003- 1st back surgery & then 2014- fusion  . COLONOSCOPY    . ESOPHAGOGASTRODUODENOSCOPY    . HEMORRHOID SURGERY    . KIDNEY TRANSPLANT  10/01/2013  . left knee surgery    . LOOP RECORDER IMPLANT  03/19/13   MDT LinQ implanted by Dr Rayann Heman for cryptogenic stroke  . LOOP RECORDER IMPLANT N/A 03/19/2013   Procedure: LOOP RECORDER IMPLANT;  Surgeon: Coralyn Mark, MD;  Location: [facility] CATH LAB;  Service: Cardiovascular;  Laterality: N/A;  . NEPHRECTOMY TRANSPLANTED ORGAN    . NEUROPLASTY / TRANSPOSITION MEDIAN NERVE AT CARPAL TUNNEL BILATERAL    . right leg surgery     pin in place  . right wrist surgery    . TEE WITHOUT CARDIOVERSION N/A 03/19/2013   Procedure: TRANSESOPHAGEAL ECHOCARDIOGRAM (TEE);  Surgeon: [name], MD;  Location: [facility] ENDOSCOPY;  Service: Cardiovascular;  Laterality: N/A;  . THYROID SURGERY  10/13/2015   Performed at [facility] with surgical pathology revealed consistent with benign follicular nodule ([name] category II)    Prior to Admission medications   Medication Sig Start Date End Date Taking? Authorizing Provider  aspirin EC 81 MG tablet Take 81 mg by mouth daily.    Yes [provider]  atorvastatin (LIPITOR) 20 MG tablet Take 40 mg by mouth at bedtime. 11/26/18  Yes [provider]  calcitRIOL (ROCALTROL) 0.25 MCG capsule Take 0.25 mcg by mouth See admin instructions. Take 1 capsule (0.25 mcg) by mouth five times weekly - Monday thru  Friday   Yes [provider]  furosemide (LASIX) 40 MG tablet Take 1 tablet (40 mg total) by mouth daily. 01/11/19 01/11/20 Yes Eugenie Filler, MD  gabapentin (NEURONTIN) 300 MG capsule Take 300 mg by mouth 2 (two) times daily.   Yes [provider]  HYDROcodone-acetaminophen (NORCO) 10-325 MG tablet Take 1-2 tablets by mouth every 4 (four) hours as needed for moderate pain.  12/26/16  Yes [provider]  insulin glargine (LANTUS) 100 unit/mL SOPN Inject  24 Units into the skin at bedtime.    Yes [provider]  insulin lispro (HUMALOG KWIKPEN) 100 UNIT/ML KwikPen Inject 5-15 Units into the skin See admin instructions. Inject 5 units subcutaneously three times daily before meals adjusted per carb count  ( add 2 units for every 25 points)   Yes [provider]  labetalol (NORMODYNE) 100 MG tablet Take 100 mg by mouth 2 (two) times daily.   Yes [provider]  methimazole (TAPAZOLE) 5 MG tablet Take 2.5 mg by mouth daily.  06/29/17  Yes [provider]  Multiple Vitamin (MULTIVITAMIN WITH MINERALS) TABS tablet Take 1 tablet by mouth daily.   Yes [provider]  mycophenolate (MYFORTIC) 180 MG EC tablet Take 180 mg by mouth 2 (two) times daily.    Yes [provider]  pantoprazole (PROTONIX) 40 MG tablet Take 1 tablet (40 mg total) by mouth daily. 01/11/19  Yes Eugenie Filler, MD  predniSONE (DELTASONE) 5 MG tablet Take 1 tablet (5 mg total) by mouth daily with breakfast. 01/17/19  Yes Eugenie Filler, MD  [facility] Wort 300 MG TABS Take 300 mg by mouth 3 (three) times daily.   Yes [provider]  tacrolimus (PROGRAF) 1 MG capsule Take 2 mg by mouth 2 (two) times daily. 06/28/17  Yes [provider]  tamsulosin (FLOMAX) 0.4 MG CAPS capsule Take 0.4 mg by mouth at bedtime.   Yes [provider]  TRADJENTA 5 MG TABS tablet Take 5 mg by mouth daily. 04/17/18  Yes [provider]  vitamin  B-12 (CYANOCOBALAMIN) 1000 MCG tablet Take 1,000 mcg by mouth 2 (two) times daily.    Yes [provider]  loperamide (IMODIUM) 2 MG capsule Take 1 capsule (2 mg total) by mouth every 12 (twelve) hours. 12/31/18   Nita Sells, MD    Scheduled Meds: . atorvastatin  40 mg Oral QHS  . gabapentin  300 mg Oral BID  . insulin aspart  0-9 Units Subcutaneous TID WC  . insulin glargine  12 Units Subcutaneous QHS  . labetalol  50 mg Oral BID  . methimazole  2.5 mg Oral Daily  . multivitamin with minerals  1 tablet Oral Daily  . mycophenolate  180 mg Oral BID  . predniSONE  5 mg Oral Q breakfast  . tacrolimus  2 mg Oral BID  . tamsulosin  0.4 mg Oral QHS   Infusions: . pantoprozole (PROTONIX) infusion 8 mg/hr (01/21/19 0149)   PRN Meds: acetaminophen **OR** acetaminophen, HYDROcodone-acetaminophen, ondansetron **OR** ondansetron (ZOFRAN) IV   Allergies as of 01/20/2019 - Review Complete 01/20/2019  Allergen Reaction Noted  . Lisinopril Other (See Comments) 11/14/2012  . Meloxicam Nausea And Vomiting 11/14/2012  . Victoza [liraglutide] Diarrhea, Nausea And Vomiting, and Swelling 12/18/2012    Family History  Problem Relation Age of Onset  . Hypertension Mother   . Hyperlipidemia Father   . Hypertension Father   . Heart disease Father        before age 70  . Renal Disease Father   . Diabetes Brother   . Hyperlipidemia Son     Social History   Socioeconomic History  . Marital status: Married    Spouse name: agnes  . Number of children: 5  . Years of education: 8th  . Highest education level: Not on file  Occupational History  . Occupation: disabled  Tobacco Use  . Smoking status: Former Smoker    Packs/day: 0.50    Years:  40.00    Pack years: 20.00    Types: Cigarettes    Quit date: 12/27/2012    Years since quitting: 6.0  . Smokeless tobacco: Never Used  Substance and Sexual Activity  . Alcohol use: No    Alcohol/week: 0.0 standard drinks  . Drug  use: No  . Sexual activity: Yes  Other Topics Concern  . Not on file  Social History Narrative   Patient lives at home with his wife Herbert Pun).  One story home with basement.   Patient is disabled.   Education 8th grade.   Right handed.   Caffeine One cup of coffee daily.   Retired Administrator.   5 children.      REVIEW OF SYSTEMS: As per HPI, otherwise negative   PHYSICAL EXAM: Vital signs in last 24 hours: Vitals:   01/21/19 0700 01/21/19 0715  BP:    Pulse: 85 86  Resp:    Temp:    SpO2: 95% 96%   Wt Readings from Last 3 Encounters:  01/07/19 100.2 kg  12/27/18 104.8 kg  10/24/18 113.9 kg    Gen: awake, alert, NAD, pale appearing HEENT: anicteric, op clear, PERRL CV: RRR, no mrg Pulm: CTA b/l Abd: soft, obese, NT/ND, +BS throughout Ext: no c/c, trace pretib edema Neuro: nonfocal, moving all 4 extremities   Intake/Output from previous day: 01/10 0701 - 01/11 0700 In: 1550 [Blood:1050; IV Piggyback:500] Out: -  Intake/Output this shift: No intake/output data recorded.  LAB RESULTS: Recent Labs    01/20/19 1343 01/21/19 0500  WBC 12.8* 8.4  HGB 8.5* 8.0*  HCT 27.2* 25.2*  PLT 169 124*   BMET Lab Results  Component Value Date   NA 140 01/21/2019   NA 142 01/20/2019   NA 140 01/11/2019   K 4.7 01/21/2019   K 4.3 01/20/2019   K 4.9 01/11/2019   CL 110 01/21/2019   CL 109 01/20/2019   CL 109 01/11/2019   CO2 21 (L) 01/21/2019   CO2 24 01/20/2019   CO2 23 01/11/2019   GLUCOSE 158 (H) 01/21/2019   GLUCOSE 100 (H) 01/20/2019   GLUCOSE 140 (H) 01/11/2019   BUN 55 (H) 01/21/2019   BUN 53 (H) 01/20/2019   BUN 71 (H) 01/11/2019   CREATININE 1.93 (H) 01/21/2019   CREATININE 2.04 (H) 01/20/2019   CREATININE 2.06 (H) 01/11/2019   CALCIUM 7.9 (L) 01/21/2019   CALCIUM 8.6 (L) 01/20/2019   CALCIUM 8.4 (L) 01/11/2019   LFT Recent Labs    01/20/19 1446 01/21/19 0500  PROT 5.4* 4.4*  ALBUMIN 2.5* 2.1*  AST 21 16  ALT 35 27  ALKPHOS 55 41   BILITOT 0.7 0.6  BILIDIR <0.1  --   IBILI NOT CALCULATED  --    PT/INR Lab Results  Component Value Date   INR 1.1 12/27/2018   Hepatitis Panel No results for input(s): HEPBSAG, HCVAB, HEPAIGM, HEPBIGM in the last 72 hours. C-Diff No components found for: CDIFF Lipase     Component Value Date/Time   LIPASE 34 07/26/2017 1800    Drugs of Abuse  No results found for: LABOPIA, COCAINSCRNUR, LABBENZ, AMPHETMU, THCU, LABBARB   RADIOLOGY STUDIES: DG Chest Port 1 View  Result Date: 01/20/2019 CLINICAL DATA:  Weakness.  Low O2 sats. EXAM: PORTABLE CHEST 1 VIEW COMPARISON:  01/04/2019 FINDINGS: Heart is borderline in size. Patchy interstitial and airspace opacities within the lungs. Findings concerning for pneumonia. No effusions or pneumothorax. No acute bony abnormality. IMPRESSION: Patchy  bilateral interstitial and airspace opacities are similar to prior study and concerning for pneumonia. Electronically Signed   By: Rolm Baptise M.D.   On: 01/20/2019 16:07     IMPRESSION:   *    Upper GI bleed w black stool R/o ulcer or gastritis or AVMs or neoplasia or portal htn or varices.   On PPI at home, now on Protonix drip.  *   Blood loss anemia.   Has received 1 unit PRBC today.  *    S/p renal transplant on anti rejection meds  *   Recent Covid 19 PNA    PLAN:     Plan EGD tomorrow.  He has had clear liquids this morning.  NPO after MN Continue PPI gtt for now Monitor Hgb which has responded to pRBCs  CKD - stable, per primary team  DM2 -- per primary team  Recent COVID-19 PNA and associated diarrhea and elevated LFTs -- treated with higher dose steroids and remdesivir.  Improved overall.  LFTs have normalized and diarrhea has resolved.  Airborne precautions thru 01/24/19  The nature of the procedure, as well as the risks, benefits, and alternatives were carefully and thoroughly reviewed with the patient. Ample time for discussion and questions allowed. The patient  understood, was satisfied, and agreed to proceed.      Azucena Freed  01/21/2019, 10:05 AM Phone [phone]

## 2019-01-21 NOTE — Progress Notes (Addendum)
PROGRESS NOTE    CAEDAN SUMLER  YHC:[id number] DOB: 18-Jul-1950 DOA: 01/20/2019 PCP: [name], MD  Brief Narrative: GREOGRY GOODWYN is a 69 y.o. male with medical history significant of stage IV chronic kidney disease, status post renal transplant on chronic immunosuppression, type 2 diabetes mellitus, hypertension, history of CVA, recent discharge from the hospital 1/1 after treatment for Covid pneumonia presented to the emergency room with weakness and low blood pressure for the last couple of days. -Patient was originally diagnosed with COVID-19 on 12/17, then he was admitted with GI symptoms and AKI, discharged home on 12/21, readmitted on 12/25 with hypoxia/Covid pneumonia, treated with IV steroids and remdesivir, discharged home on steroid taper back to his home regimen of 5 mg of prednisone. -Presented to the emergency room with worsening weakness, dizziness, low blood pressure.  Also history of melena few days ago  ED Course: Blood pressure was 101 on arrival, this dropped to 70 on standing, labs noted 2.5-3 g drop in hemoglobin from a week ago, chest x-ray noted patchy interstitial opacities unchanged from prior, Hemoccult noted heme positive black stools.   Assessment & Plan:   Upper GI bleed/melena Acute blood loss anemia -Suspect peptic ulcer disease or gastritis secondary to recent increase steroid use, stress etc- now back down to basal dose of prednisone 5 mg daily which will be continued -History of ongoing melena -Continue IV PPI, transfused 1 unit of PRBC last night on admission due to symptomatic anemia -Hemoglobin 8.0 this morning -Will transfuse 1 unit of PRBC today due to significant symptomatic anemia with hypotension  -[facility] consulting, plan for endoscopy tomorrow  CKD stage IV/status post cadaveric renal transplant in 2015 -Baseline creatinine is 2-2.5 -Kidney function is stable, monitor volume status closely, hold Lasix -Continue home regimen of  Prograf, CellCept, prednisone 5 mg daily  Type 2 diabetes mellitus with hyperglycemia -Recent hemoglobin A1c was 7.8 -Continue Lantus at lower dose, CBGs stable, SSI  Recent Covid pneumonia -originally diagnosed with COVID-19 on 12/17 -Recently treated with IV remdesivir and Decadron -Improved from that standpoint, given his chronically immunosuppressed state he would need a 4-week course of isolation which will be completed on 1/14 -Chest x-ray stable -Continue airborne/contact precautions until 1/14  Hyperthyroidism -Continue methimazole  History of CVA -Hold aspirin  DVT prophylaxis: SCDs Code Status: Full code per patient's wishes Family Communication: No family at bedside, discussed the patient Disposition Plan: Home pending resolution of GI bleed Consults called: [facility]  Consultants:   [facility]   Procedures:   Antimicrobials:    Subjective: -Feels weak, continues to have black stools  Objective: Vitals:   01/21/19 1130 01/21/19 1215 01/21/19 1300 01/21/19 1315  BP: 122/67 114/62 106/63 105/63  Pulse: 94 81 84 85  Resp: 17 18 (!) 21 18  Temp:      TempSrc:      SpO2: 91% 94% 99% 97%    Intake/Output Summary (Last 24 hours) at 01/21/2019 1343 Last data filed at 01/21/2019 0145 Gross per 24 hour  Intake 1550 ml  Output -  Net 1550 ml   There were no vitals filed for this visit.  Examination:  Gen: Chronically ill-appearing male, AAOx3 HEENT: PERRLA, Neck supple, no JVD Lungs: Few basilar rhonchi  CVS: RRR,No Gallops,Rubs or new Murmurs Abd: soft, Non tender, non distended, BS present Extremities: No edema Skin: no new rashes Psychiatry: Judgement and insight appear normal. Mood & affect appropriate.     Data Reviewed:  CBC: Recent Labs  Lab 01/20/19 1343 01/21/19 0500  WBC 12.8* 8.4  HGB 8.5* 8.0*  HCT 27.2* 25.2*  MCV 97.1 96.6  PLT 169 295*   Basic Metabolic Panel: Recent Labs  Lab 01/20/19 1343 01/21/19 0500  NA 142  140  K 4.3 4.7  CL 109 110  CO2 24 21*  GLUCOSE 100* 158*  BUN 53* 55*  CREATININE 2.04* 1.93*  CALCIUM 8.6* 7.9*   GFR: CrCl cannot be calculated (Unknown ideal weight.). Liver Function Tests: Recent Labs  Lab 01/20/19 1446 01/21/19 0500  AST 21 16  ALT 35 27  ALKPHOS 55 41  BILITOT 0.7 0.6  PROT 5.4* 4.4*  ALBUMIN 2.5* 2.1*   No results for input(s): LIPASE, AMYLASE in the last 168 hours. No results for input(s): AMMONIA in the last 168 hours. Coagulation Profile: No results for input(s): INR, PROTIME in the last 168 hours. Cardiac Enzymes: No results for input(s): CKTOTAL, CKMB, CKMBINDEX, TROPONINI in the last 168 hours. BNP (last 3 results) No results for input(s): PROBNP in the last 8760 hours. HbA1C: No results for input(s): HGBA1C in the last 72 hours. CBG: Recent Labs  Lab 01/20/19 2247 01/21/19 0819 01/21/19 1148  GLUCAP 154* 172* 176*   Lipid Profile: No results for input(s): CHOL, HDL, LDLCALC, TRIG, CHOLHDL, LDLDIRECT in the last 72 hours. Thyroid Function Tests: No results for input(s): TSH, T4TOTAL, FREET4, T3FREE, THYROIDAB in the last 72 hours. Anemia Panel: No results for input(s): VITAMINB12, FOLATE, FERRITIN, TIBC, IRON, RETICCTPCT in the last 72 hours. Urine analysis:    Component Value Date/Time   COLORURINE YELLOW 12/28/2018 0300   APPEARANCEUR HAZY (A) 12/28/2018 0300   LABSPEC 1.019 12/28/2018 0300   PHURINE 5.0 12/28/2018 0300   GLUCOSEU NEGATIVE 12/28/2018 0300   HGBUR SMALL (A) 12/28/2018 0300   BILIRUBINUR NEGATIVE 12/28/2018 0300   KETONESUR NEGATIVE 12/28/2018 0300   PROTEINUR 30 (A) 12/28/2018 0300   UROBILINOGEN 0.2 01/23/2011 0551   NITRITE NEGATIVE 12/28/2018 0300   LEUKOCYTESUR NEGATIVE 12/28/2018 0300   Sepsis Labs: @LABRCNTIP (procalcitonin:4,lacticidven:4)  )No results found for this or any previous visit (from the past 240 hour(s)).       Radiology Studies: DG Chest Port 1 View  Result Date: 01/20/2019  CLINICAL DATA:  Weakness.  Low O2 sats. EXAM: PORTABLE CHEST 1 VIEW COMPARISON:  01/04/2019 FINDINGS: Heart is borderline in size. Patchy interstitial and airspace opacities within the lungs. Findings concerning for pneumonia. No effusions or pneumothorax. No acute bony abnormality. IMPRESSION: Patchy bilateral interstitial and airspace opacities are similar to prior study and concerning for pneumonia. Electronically Signed   By: Rolm Baptise M.D.   On: 01/20/2019 16:07        Scheduled Meds: . atorvastatin  40 mg Oral QHS  . gabapentin  300 mg Oral BID  . insulin aspart  0-9 Units Subcutaneous TID WC  . insulin glargine  12 Units Subcutaneous QHS  . labetalol  50 mg Oral BID  . methimazole  2.5 mg Oral Daily  . multivitamin with minerals  1 tablet Oral Daily  . mycophenolate  180 mg Oral BID  . predniSONE  5 mg Oral Q breakfast  . tacrolimus  2 mg Oral BID  . tamsulosin  0.4 mg Oral QHS   Continuous Infusions: . pantoprozole (PROTONIX) infusion 8 mg/hr (01/21/19 0149)     LOS: 1 day    Time spent: 17min  Domenic Polite, MD [facility] 01/21/2019, 1:43 PM

## 2019-01-21 NOTE — Progress Notes (Addendum)
CRITICAL VALUE ALERT  Critical Value:  Hemoglobin 6.6  Date & Time Notied:  10:34pm 01/21/2019  Provider Notified: Sharlet Salina  Orders Received/Actions taken: 3 units RBC ordered

## 2019-01-21 NOTE — Plan of Care (Signed)

## 2019-01-21 NOTE — Consult Note (Signed)
   [facility] CM Inpatient Consult   01/21/2019  Jon [name] 1950-01-16 [id number]   Patient screened for high risk score of 27%  for unplanned readmission score and for less than 30 days readmission hospitalization in the [employer] in the [employer].  Patient has recently been assigned a Academic librarian.  Review of patient's medical record reveals patient is was admitted with complaints of weakness and low blood pressure and recently transitioned home from the hospital after treatment from COVID pneumonia.  Primary Care Provider is Jacqlyn Larsen, MD. [facility] in [place] which provides the transition of care call.   Pharmacy is: [facility]  Plan:  Will continue to follow.  Follow up with inpatient [facility] team and for progress and disposition to assess for post hospital care management needs.    Please place a [facility] Care Management consult as appropriate and for questions contact:   Natividad Brood, RN BSN [facility] Liaison  [phone] business mobile phone Toll free office [phone]  Fax number: [phone] [name]

## 2019-01-21 NOTE — ED Notes (Signed)
Bfast ordered  

## 2019-01-22 ENCOUNTER — Encounter (HOSPITAL_COMMUNITY): Payer: Self-pay | Admitting: Internal Medicine

## 2019-01-22 ENCOUNTER — Encounter (HOSPITAL_COMMUNITY)
Admission: EM | Disposition: A | Discharge status: Home Health | Payer: Self-pay | Source: Home / Self Care | Attending: Internal Medicine

## 2019-01-22 ENCOUNTER — Inpatient Hospital Stay (HOSPITAL_COMMUNITY): Payer: Medicare Other | Admitting: Anesthesiology

## 2019-01-22 DIAGNOSIS — K264 Chronic or unspecified duodenal ulcer with hemorrhage: Principal | ICD-10-CM

## 2019-01-22 HISTORY — PX: HEMOSTASIS CLIP PLACEMENT: SHX6857

## 2019-01-22 HISTORY — PX: ESOPHAGOGASTRODUODENOSCOPY (EGD) WITH PROPOFOL: SHX5813

## 2019-01-22 HISTORY — PX: SCLEROTHERAPY: SHX6841

## 2019-01-22 LAB — CBC
HCT: 28 % — ABNORMAL LOW (ref 39.0–52.0)
HCT: 28.3 % — ABNORMAL LOW (ref 39.0–52.0)
Hemoglobin: 9.3 g/dL — ABNORMAL LOW (ref 13.0–17.0)
Hemoglobin: 9.4 g/dL — ABNORMAL LOW (ref 13.0–17.0)
MCH: 30.1 pg (ref 26.0–34.0)
MCH: 30 pg (ref 26.0–34.0)
MCHC: 33.2 g/dL (ref 30.0–36.0)
MCHC: 33.2 g/dL (ref 30.0–36.0)
MCV: 90.6 fL (ref 80.0–100.0)
MCV: 90.4 fL (ref 80.0–100.0)
Platelets: 109 10*3/uL — ABNORMAL LOW (ref 150–400)
Platelets: 109 10*3/uL — ABNORMAL LOW (ref 150–400)
RBC: 3.09 MIL/uL — ABNORMAL LOW (ref 4.22–5.81)
RBC: 3.13 MIL/uL — ABNORMAL LOW (ref 4.22–5.81)
RDW: 15.9 % — ABNORMAL HIGH (ref 11.5–15.5)
RDW: 15.9 % — ABNORMAL HIGH (ref 11.5–15.5)
WBC: 14.8 10*3/uL — ABNORMAL HIGH (ref 4.0–10.5)
WBC: 14.6 10*3/uL — ABNORMAL HIGH (ref 4.0–10.5)
nRBC: 0.3 % — ABNORMAL HIGH (ref 0.0–0.2)
nRBC: 0.3 % — ABNORMAL HIGH (ref 0.0–0.2)

## 2019-01-22 LAB — BASIC METABOLIC PANEL
Anion gap: 8 (ref 5–15)
BUN: 77 mg/dL — ABNORMAL HIGH (ref 8–23)
CO2: 18 mmol/L — ABNORMAL LOW (ref 22–32)
Calcium: 7.3 mg/dL — ABNORMAL LOW (ref 8.9–10.3)
Chloride: 112 mmol/L — ABNORMAL HIGH (ref 98–111)
Creatinine, Ser: 2.65 mg/dL — ABNORMAL HIGH (ref 0.61–1.24)
GFR calc Af Amer: 28 mL/min — ABNORMAL LOW (ref >60)
GFR calc non Af Amer: 24 mL/min — ABNORMAL LOW (ref >60)
Glucose, Bld: 226 mg/dL — ABNORMAL HIGH (ref 70–99)
Potassium: 5.1 mmol/L (ref 3.5–5.1)
Sodium: 138 mmol/L (ref 135–145)

## 2019-01-22 LAB — GLUCOSE, CAPILLARY
Glucose-Capillary: 216 mg/dL — ABNORMAL HIGH (ref 70–99)
Glucose-Capillary: 191 mg/dL — ABNORMAL HIGH (ref 70–99)
Glucose-Capillary: 229 mg/dL — ABNORMAL HIGH (ref 70–99)
Glucose-Capillary: 192 mg/dL — ABNORMAL HIGH (ref 70–99)

## 2019-01-22 LAB — HEMOGLOBIN AND HEMATOCRIT, BLOOD
HCT: 29.8 % — ABNORMAL LOW (ref 39.0–52.0)
Hemoglobin: 9.8 g/dL — ABNORMAL LOW (ref 13.0–17.0)

## 2019-01-22 LAB — PREPARE RBC (CROSSMATCH)

## 2019-01-22 SURGERY — ESOPHAGOGASTRODUODENOSCOPY (EGD) WITH PROPOFOL
Anesthesia: General

## 2019-01-22 MED ORDER — PROPOFOL 10 MG/ML IV BOLUS
INTRAVENOUS | Status: DC | PRN
  Administered 2019-01-22: 110 mg via INTRAVENOUS

## 2019-01-22 MED ORDER — SUCCINYLCHOLINE CHLORIDE 200 MG/10ML IV SOSY
PREFILLED_SYRINGE | INTRAVENOUS | Status: DC | PRN
  Administered 2019-01-22: 120 mg via INTRAVENOUS

## 2019-01-22 MED ORDER — SODIUM CHLORIDE 0.9% IV SOLUTION: Freq: Once | INTRAVENOUS | Status: AC

## 2019-01-22 MED ORDER — SODIUM CHLORIDE (PF) 0.9 % IJ SOLN
PREFILLED_SYRINGE | INTRAMUSCULAR | Status: DC | PRN
  Administered 2019-01-22: 1 mL

## 2019-01-22 MED ORDER — PHENYLEPHRINE HCL-NACL 10-0.9 MG/250ML-% IV SOLN
INTRAVENOUS | Status: DC | PRN
  Administered 2019-01-22: 50 ug/min via INTRAVENOUS

## 2019-01-22 MED ORDER — ONDANSETRON HCL 4 MG/2ML IJ SOLN
INTRAMUSCULAR | Status: DC | PRN
  Administered 2019-01-22: 4 mg via INTRAVENOUS

## 2019-01-22 MED ORDER — PHENYLEPHRINE 40 MCG/ML (10ML) SYRINGE FOR IV PUSH (FOR BLOOD PRESSURE SUPPORT)
PREFILLED_SYRINGE | INTRAVENOUS | Status: DC | PRN
  Administered 2019-01-22 (×6): 80 ug via INTRAVENOUS

## 2019-01-22 MED ORDER — FENTANYL CITRATE (PF) 250 MCG/5ML IJ SOLN
INTRAMUSCULAR | Status: DC | PRN
  Administered 2019-01-22: 50 ug via INTRAVENOUS

## 2019-01-22 MED ORDER — EPINEPHRINE 1 MG/10ML IJ SOSY
PREFILLED_SYRINGE | INTRAMUSCULAR | Status: AC
  Filled 2019-01-22: qty 10

## 2019-01-22 MED ORDER — FENTANYL CITRATE (PF) 100 MCG/2ML IJ SOLN
INTRAMUSCULAR | Status: AC
  Filled 2019-01-22: qty 2

## 2019-01-22 MED ORDER — SODIUM CHLORIDE 0.9 % IV SOLN: INTRAVENOUS | Status: DC | PRN

## 2019-01-22 MED ORDER — LIDOCAINE 2% (20 MG/ML) 5 ML SYRINGE
INTRAMUSCULAR | Status: DC | PRN
  Administered 2019-01-22: 20 mg via INTRAVENOUS

## 2019-01-22 SURGICAL SUPPLY — 15 items

## 2019-01-22 NOTE — Transfer of Care (Signed)
Immediate Anesthesia Transfer of Care Note  Patient: Jon [name]  Procedure(s) Performed: ESOPHAGOGASTRODUODENOSCOPY (EGD) WITH PROPOFOL (N/A ) SCLEROTHERAPY HEMOSTASIS CLIP PLACEMENT  Patient Location: Endoscopy Unit  Anesthesia Type:General  Level of Consciousness: awake and patient cooperative  Airway & Oxygen Therapy: Patient Spontanous Breathing and Patient connected to face mask oxygen  Post-op Assessment: Report given to RN and Post -op Vital signs reviewed and stable  Post vital signs: Reviewed and stable  Last Vitals:  Vitals Value Taken Time  BP    Temp    Pulse    Resp    SpO2      Last Pain:  Vitals:   01/22/19 1251  TempSrc: Oral  PainSc: 0-No pain      Patients Stated Pain Goal: 3 (02/33/43 5686)  Complications: No apparent anesthesia complications

## 2019-01-22 NOTE — Anesthesia Postprocedure Evaluation (Signed)
Anesthesia Post Note  Patient: Jon [name]  Procedure(s) Performed: ESOPHAGOGASTRODUODENOSCOPY (EGD) WITH PROPOFOL (N/A ) [place]     Patient location during evaluation: Endoscopy Anesthesia Type: General Level of consciousness: awake and alert, patient cooperative and oriented Pain management: pain level controlled Vital Signs Assessment: post-procedure vital signs reviewed and stable Respiratory status: spontaneous breathing, nonlabored ventilation, respiratory function stable and patient connected to face mask oxygen Cardiovascular status: blood pressure returned to baseline and stable Postop Assessment: no apparent nausea or vomiting Anesthetic complications: no    Last Vitals:  Vitals:   01/22/19 1405 01/22/19 1409  BP: (!) 113/50 (!) 112/48  Pulse: 95 100  Resp: 20 20  Temp:    SpO2: (!) 84% 95%    Last Pain:  Vitals:   01/22/19 1409  TempSrc:   PainSc: 0-No pain                 Joeli Fenner,E. Gearline Spilman

## 2019-01-22 NOTE — Op Note (Signed)
[facility] Patient Name: Jon [name] Procedure Date : 01/22/2019 MRN: [id number] Attending MD: [name] , MD Date of Birth: 08-08-1950 CSN: [id number] Age: 69 Admit Type: Inpatient Procedure:                Upper GI endoscopy Indications:              Acute post hemorrhagic anemia, Melena Providers:                Lajuan Lines. Hilarie Fredrickson, MD, Glori Bickers, RN, William Dalton, Technician Referring MD:             [facility] Medicines:                General Anesthesia Complications:            No immediate complications. Estimated Blood Loss:     Estimated blood loss: none. Procedure:                Pre-Anesthesia Assessment:                           - Prior to the procedure, a History and Physical                            was performed, and patient medications and                            allergies were reviewed. The patient's tolerance of                            previous anesthesia was also reviewed. The risks                            and benefits of the procedure and the sedation                            options and risks were discussed with the patient.                            All questions were answered, and informed consent                            was obtained. Prior Anticoagulants: The patient has                            taken no previous anticoagulant or antiplatelet                            agents. ASA Grade Assessment: III - A patient with                            severe systemic disease. After reviewing the risks  and benefits, the patient was deemed in                            satisfactory condition to undergo the procedure.                           After obtaining informed consent, the endoscope was                            passed under direct vision. Throughout the                            procedure, the patient's blood pressure, pulse, and       oxygen saturations were monitored continuously. The                            GIF-H190 ([id number]) Olympus gastroscope was                            introduced through the mouth, and advanced to the                            second part of duodenum. The upper GI endoscopy was                            accomplished without difficulty. The patient                            tolerated the procedure well. Scope In: Scope Out: Findings:      The examined esophagus was normal.      A small hiatal hernia was present.      Scattered mild inflammation characterized by erosions and erythema was       found in the gastric fundus, in the gastric body and in the gastric       antrum.      Three cratered duodenal ulcers, one with a visible vessel were found in       the duodenal bulb. The largest lesion was 10 mm in largest dimension,       though the ulcer with visible vessel was 5-6 mm. 2 areas at ulcer edge       was successfully injected with 1 mL of a 1:10,000 solution of       epinephrine for hemostasis. There was good blanching effect. For       hemostasis, two hemostatic clips were successfully placed. There was no       bleeding at the end of the maneuver. Impression:               - Normal esophagus.                           - Small hiatal hernia.                           - Gastritis.                           -  Duodenal ulcers with a visible vessel. Injected.                            Clips were placed.                           - No specimens collected. Moderate Sedation:      N/A Recommendation:           - Return patient to hospital ward for ongoing care.                           - Full liquid diet, advance as tolerated.                           - Continue present medications including PPI gtt                            for 72 hours, then change to PO BID for 8-12 weeks.                           - No aspirin, ibuprofen, naproxen, or other                             non-steroidal anti-inflammatory drugs.                           - Check H. Pylori stool Ag and treat if positive                            (can be treated upon discharge).                           - Monitor Hgb after transfusion. Procedure Code(s):        --- Professional ---                           [phone], Esophagogastroduodenoscopy, flexible,                            transoral; with control of bleeding, any method Diagnosis Code(s):        --- Professional ---                           K44.9, Diaphragmatic hernia without obstruction or                            gangrene                           K29.70, Gastritis, unspecified, without bleeding                           K26.4, Chronic or unspecified duodenal ulcer with  hemorrhage                           D62, Acute posthemorrhagic anemia                           K92.1, Melena (includes Hematochezia) CPT copyright 2019 [employer]. All rights reserved. The codes documented in this report are preliminary and upon coder review may  be revised to meet current compliance requirements. [name], MD 01/22/2019 1:45:07 PM This report has been signed electronically. Number of Addenda: 0

## 2019-01-22 NOTE — Progress Notes (Addendum)
PROGRESS NOTE    Jon [name]  YKD:[id number] DOB: 05/15/1950 DOA: 01/20/2019 PCP: [name], MD  Brief Narrative: Jon [name] is a 69 y.o. male with medical history significant of stage IV chronic kidney disease, status post renal transplant on chronic immunosuppression, type 2 diabetes mellitus, hypertension, history of CVA, recent discharge from the hospital 1/1 after treatment for Covid pneumonia presented to the emergency room with weakness and low blood pressure for the last couple of days. -Patient was originally diagnosed with COVID-19 on 12/17, then he was admitted with GI symptoms and AKI, discharged home on 12/21, readmitted on 12/25 with hypoxia/Covid pneumonia, treated with IV steroids and remdesivir, discharged home on steroid taper back to his home regimen of 5 mg of prednisone. -Presented to the emergency room with worsening weakness, dizziness, low blood pressure.  Also history of melena few days ago  ED Course: Blood pressure was 101 on arrival, this dropped to 70 on standing, labs noted 2.5-3 g drop in hemoglobin from a week ago, chest x-ray noted patchy interstitial opacities unchanged from prior, Hemoccult noted heme positive black stools, GI consulted on admission -Overnight 1/12 with frequent melena, hemoglobin down to 6.6gm/dl,   6 episodes of melena reported, 3 units of PRBC transfused  Assessment & Plan:   Upper GI bleed/melena Acute blood loss anemia Transient hemorrhagic shock -Suspect peptic ulcer disease or AVM, likely secondary to recent increase steroid use, stress etc- now back down to basal dose of prednisone 5 mg daily which will be continued -GI consulted, EGD 1/12 planned -Patient with profuse melena overnight, night nurse reported 6 episodes-required 3 units of PRBC overnight, gastroenterology Dr. Hilarie Fredrickson notified this morning regarding overnight events -Patient did drop his blood pressure to 80s last night, now improved to 110 post  transfusion -Repeat hemoglobin is 9.3 this morning, recheck again this afternoon, transfuse as needed -Monitor in progressive care, low threshold to transfer to ICU if he becomes unstable -Continue Protonix drip -Total of 4 units PRBC transfused thus far  AKI on CKD stage IV/status post cadaveric renal transplant in 2015 -Baseline creatinine is 2-2.5 -Creatinine up to 2.6, likely hemodynamically mediated from hypotension last night, does not appear volume overloaded, continue to hold Lasix -Status post 3 units PRBC transfusion -Monitor kidney function, if creatinine worsens will need nephrology input -Continue home regimen of Prograf, CellCept, prednisone 5 mg daily  Type 2 diabetes mellitus with hyperglycemia -Recent hemoglobin A1c was 7.8 -Continue Lantus at lower dose, CBGs stable, SSI  Recent Covid pneumonia -originally diagnosed with COVID-19 on 12/17 and hospitalized from 12/24-1/1 -Completed treatment with IV remdesivir and Decadron -Improved from that standpoint, given his chronically immunosuppressed state he would need a 4-week course of isolation which will be completed on 1/14 -Chest x-ray stable -Continue airborne/contact precautions until 1/14  Hyperthyroidism -Continue methimazole  History of CVA -Hold aspirin  DVT prophylaxis: SCDs Code Status: Full code per patient's wishes Family Communication: No family at bedside, called and updated wife Jon [name] 1/12 Disposition Plan:  Keep in progressive care, may need ICU transfer if worsens, Home pending resolution of GI bleed Consults called: [facility]  Consultants:   [facility]   Procedures:   Antimicrobials:    Subjective: -Feels weak, continues to have black stools  Objective: Vitals:   01/22/19 0700 01/22/19 0800 01/22/19 1000 01/22/19 1138  BP: 117/66 128/65 121/73 101/62  Pulse:      Resp: 20 (!) 23 15 20   Temp:  98 F (36.7  C)  98.8 F (37.1 C)  TempSrc:  Oral  Oral  SpO2: 95% 95%  94%   Weight:      Height:        Intake/Output Summary (Last 24 hours) at 01/22/2019 1236 Last data filed at 01/22/2019 1138 Gross per 24 hour  Intake 3726.81 ml  Output --  Net 3726.81 ml   Filed Weights   01/21/19 1413  Weight: 100 kg    Examination:  Gen: Chronically ill-appearing male, laying in bed, AAOx3, no distress  HEENT: Pallor noted, no JVD Lungs: Few scattered rhonchi, no basilar rales heard CVS: RRR,No Gallops,Rubs or new Murmurs Abd: soft, Non tender, non distended, BS present Extremities: No edema Skin: no new rashes Psychiatry: Mood & affect appropriate.     Data Reviewed:   CBC: Recent Labs  Lab 01/20/19 1343 01/21/19 0500 01/21/19 1457 01/21/19 2151 01/22/19 0948  WBC 12.8* 8.4 10.6* 12.5* 14.8*  HGB 8.5* 8.0* 7.8* 6.6* 9.3*  HCT 27.2* 25.2* 24.6* 20.7* 28.0*  MCV 97.1 96.6 96.9 96.7 90.6  PLT 169 124* 139* 147* 703*   Basic Metabolic Panel: Recent Labs  Lab 01/20/19 1343 01/21/19 0500 01/21/19 2151 01/22/19 0948  NA 142 140 139 138  K 4.3 4.7 4.7 5.1  CL 109 110 111 112*  CO2 24 21* 19* 18*  GLUCOSE 100* 158* 235* 226*  BUN 53* 55* 63* 77*  CREATININE 2.04* 1.93* 2.10* 2.65*  CALCIUM 8.6* 7.9* 7.6* 7.3*   GFR: Estimated Creatinine Clearance: 32.6 mL/min (A) (by C-G formula based on SCr of 2.65 mg/dL (H)). Liver Function Tests: Recent Labs  Lab 01/20/19 1446 01/21/19 0500  AST 21 16  ALT 35 27  ALKPHOS 55 41  BILITOT 0.7 0.6  PROT 5.4* 4.4*  ALBUMIN 2.5* 2.1*   No results for input(s): LIPASE, AMYLASE in the last 168 hours. No results for input(s): AMMONIA in the last 168 hours. Coagulation Profile: Recent Labs  Lab 01/21/19 1457  INR 1.1   Cardiac Enzymes: No results for input(s): CKTOTAL, CKMB, CKMBINDEX, TROPONINI in the last 168 hours. BNP (last 3 results) No results for input(s): PROBNP in the last 8760 hours. HbA1C: No results for input(s): HGBA1C in the last 72 hours. CBG: Recent Labs  Lab 01/21/19 1404  01/21/19 1742 01/21/19 2218 01/22/19 0820 01/22/19 1137  GLUCAP 158* 239* 214* 216* 191*   Lipid Profile: No results for input(s): CHOL, HDL, LDLCALC, TRIG, CHOLHDL, LDLDIRECT in the last 72 hours. Thyroid Function Tests: No results for input(s): TSH, T4TOTAL, FREET4, T3FREE, THYROIDAB in the last 72 hours. Anemia Panel: No results for input(s): VITAMINB12, FOLATE, FERRITIN, TIBC, IRON, RETICCTPCT in the last 72 hours. Urine analysis:    Component Value Date/Time   COLORURINE YELLOW 12/28/2018 0300   APPEARANCEUR HAZY (A) 12/28/2018 0300   LABSPEC 1.019 12/28/2018 0300   PHURINE 5.0 12/28/2018 0300   GLUCOSEU NEGATIVE 12/28/2018 0300   HGBUR SMALL (A) 12/28/2018 0300   BILIRUBINUR NEGATIVE 12/28/2018 0300   KETONESUR NEGATIVE 12/28/2018 0300   PROTEINUR 30 (A) 12/28/2018 0300   UROBILINOGEN 0.2 01/23/2011 0551   NITRITE NEGATIVE 12/28/2018 0300   LEUKOCYTESUR NEGATIVE 12/28/2018 0300   Sepsis Labs: @LABRCNTIP (procalcitonin:4,lacticidven:4)  )No results found for this or any previous visit (from the past 240 hour(s)).       Radiology Studies: DG Chest Port 1 View  Result Date: 01/20/2019 CLINICAL DATA:  Weakness.  Low O2 sats. EXAM: PORTABLE CHEST 1 VIEW COMPARISON:  01/04/2019 FINDINGS: Heart is borderline in  size. Patchy interstitial and airspace opacities within the lungs. Findings concerning for pneumonia. No effusions or pneumothorax. No acute bony abnormality. IMPRESSION: Patchy bilateral interstitial and airspace opacities are similar to prior study and concerning for pneumonia. Electronically Signed   By: Rolm Baptise M.D.   On: 01/20/2019 16:07        Scheduled Meds: . atorvastatin  40 mg Oral QHS  . gabapentin  300 mg Oral QHS  . insulin aspart  0-9 Units Subcutaneous TID WC  . insulin glargine  12 Units Subcutaneous QHS  . labetalol  50 mg Oral BID  . methimazole  2.5 mg Oral Daily  . multivitamin with minerals  1 tablet Oral Daily  . mycophenolate   180 mg Oral BID  . predniSONE  5 mg Oral Q breakfast  . tacrolimus  2 mg Oral BID  . tamsulosin  0.4 mg Oral QHS   Continuous Infusions: . pantoprozole (PROTONIX) infusion 8 mg/hr (01/22/19 1135)     LOS: 2 days    Time spent: 92min  Domenic Polite, MD [facility] 01/22/2019, 12:36 PM

## 2019-01-22 NOTE — Plan of Care (Signed)

## 2019-01-22 NOTE — Anesthesia Preprocedure Evaluation (Addendum)
Anesthesia Evaluation  Patient identified by MRN, date of birth, ID band Patient awake    Reviewed: Allergy & Precautions, NPO status , Patient's Chart, lab work & pertinent test results  History of Anesthesia Complications Negative for: history of anesthetic complications  Airway Mallampati: II  TM Distance: >3 FB Neck ROM: Full    Dental  (+) Dental Advisory Given   Pulmonary sleep apnea , COPD, former smoker,  COVID 19 positive!: O2 sats in 80's, on steroids   breath sounds clear to auscultation       Cardiovascular hypertension, Pt. on medications  Rhythm:Regular Rate:Normal  '20 ECHO: EF 60-65%, valves OK Lexiscan Myoview (05/2013):  No ischemia, EF 53%; Normal Study   Neuro/Psych CVA, No Residual Symptoms    GI/Hepatic GERD  Medicated,GI bleed   Endo/Other  diabetes, Insulin Dependentobese  Renal/GU ESRFRenal disease (K+ 5.1)     Musculoskeletal  (+) Arthritis ,   Abdominal (+) + obese,   Peds  Hematology  (+) Blood dyscrasia (acute GI bleed), anemia ,   Anesthesia Other Findings   Reproductive/Obstetrics                            Anesthesia Physical Anesthesia Plan  ASA: III  Anesthesia Plan: General   Post-op Pain Management:    Induction: Intravenous and Rapid sequence  PONV Risk Score and Plan: 2 and Ondansetron and Treatment may vary due to age or medical condition  Airway Management Planned: Oral ETT and Video Laryngoscope Planned  Additional Equipment:   Intra-op Plan:   Post-operative Plan: Extubation in OR  Informed Consent: I have reviewed the patients History and Physical, chart, labs and discussed the procedure including the risks, benefits and alternatives for the proposed anesthesia with the patient or authorized representative who has indicated his/her understanding and acceptance.     Dental advisory given  Plan Discussed with: CRNA and  Surgeon  Anesthesia Plan Comments:         Anesthesia Quick Evaluation

## 2019-01-22 NOTE — Progress Notes (Signed)
6 large black liquid form stool since 7pm. FYI page sent to Floor coverage.

## 2019-01-22 NOTE — Progress Notes (Signed)
Notified MD of patients bladder scan >320ml. Patient states the urge to pee but can't lying down. Attempted to stand at bedside but patient too dizzy to stand. Will continue to monitor. Blood pressures still low.

## 2019-01-22 NOTE — Anesthesia Procedure Notes (Signed)
Procedure Name: Intubation Date/Time: 01/22/2019 1:18 PM Performed by: Renato Shin, CRNA Pre-anesthesia Checklist: Patient identified Patient Re-evaluated:Patient Re-evaluated prior to induction Oxygen Delivery Method: Circle system utilized Preoxygenation: Pre-oxygenation with 100% oxygen Induction Type: IV induction and Rapid sequence Laryngoscope Size: Glidescope and 4 Grade View: Grade I Tube type: Oral Tube size: 7.5 mm Number of attempts: 1 Airway Equipment and Method: Oral airway,  Rigid stylet and Video-laryngoscopy Placement Confirmation: ETT inserted through vocal cords under direct vision,  positive ETCO2 and breath sounds checked- equal and bilateral Secured at: 22 cm Tube secured with: Tape Dental Injury: Teeth and Oropharynx as per pre-operative assessment

## 2019-01-23 ENCOUNTER — Other Ambulatory Visit: Payer: Medicare Other

## 2019-01-23 ENCOUNTER — Encounter: Payer: Self-pay | Admitting: *Deleted

## 2019-01-23 LAB — BASIC METABOLIC PANEL
Anion gap: 8 (ref 5–15)
BUN: 79 mg/dL — ABNORMAL HIGH (ref 8–23)
CO2: 20 mmol/L — ABNORMAL LOW (ref 22–32)
Calcium: 7.5 mg/dL — ABNORMAL LOW (ref 8.9–10.3)
Chloride: 112 mmol/L — ABNORMAL HIGH (ref 98–111)
Creatinine, Ser: 2.77 mg/dL — ABNORMAL HIGH (ref 0.61–1.24)
GFR calc Af Amer: 26 mL/min — ABNORMAL LOW (ref >60)
GFR calc non Af Amer: 23 mL/min — ABNORMAL LOW (ref >60)
Glucose, Bld: 163 mg/dL — ABNORMAL HIGH (ref 70–99)
Potassium: 4.5 mmol/L (ref 3.5–5.1)
Sodium: 140 mmol/L (ref 135–145)

## 2019-01-23 LAB — CBC
HCT: 27.7 % — ABNORMAL LOW (ref 39.0–52.0)
Hemoglobin: 9.1 g/dL — ABNORMAL LOW (ref 13.0–17.0)
MCH: 28.8 pg (ref 26.0–34.0)
MCHC: 32.9 g/dL (ref 30.0–36.0)
MCV: 87.7 fL (ref 80.0–100.0)
Platelets: 96 10*3/uL — ABNORMAL LOW (ref 150–400)
RBC: 3.16 MIL/uL — ABNORMAL LOW (ref 4.22–5.81)
RDW: 19.9 % — ABNORMAL HIGH (ref 11.5–15.5)
WBC: 9.5 10*3/uL (ref 4.0–10.5)
nRBC: 0.2 % (ref 0.0–0.2)

## 2019-01-23 LAB — GLUCOSE, CAPILLARY
Glucose-Capillary: 145 mg/dL — ABNORMAL HIGH (ref 70–99)
Glucose-Capillary: 217 mg/dL — ABNORMAL HIGH (ref 70–99)
Glucose-Capillary: 177 mg/dL — ABNORMAL HIGH (ref 70–99)
Glucose-Capillary: 186 mg/dL — ABNORMAL HIGH (ref 70–99)

## 2019-01-23 MED ORDER — PANTOPRAZOLE SODIUM 40 MG IV SOLR
40.0000 mg | Freq: Two times a day (BID) | INTRAVENOUS | Status: DC
  Administered 2019-01-23 – 2019-01-24 (×3): 40 mg via INTRAVENOUS
  Filled 2019-01-23 (×3): qty 40

## 2019-01-23 NOTE — Progress Notes (Signed)
          Daily Rounding Note  01/23/2019, 10:20 AM  LOS: 3 days   SUBJECTIVE:   Chief complaint: bleeding duodenal ulcer w blood loss anemia     Dark brown stool this AM.  RN reports pt doing well.   Pt dizzy this AM.  BPs 90s/50-60s, HR to 80s, low 90s.   RA sats high 90s.    OBJECTIVE:         Vital signs in last 24 hours:    Temp:  [97.6 F (36.4 C)-98.8 F (37.1 C)] 98.2 F (36.8 C) (01/13 0500) Pulse Rate:  [86-108] 86 (01/13 0328) Resp:  [14-24] 16 (01/13 0800) BP: (81-126)/(48-86) 126/62 (01/13 0800) SpO2:  [84 %-98 %] 98 % (01/13 0800) Weight:  [103.9 kg] 103.9 kg (01/12 1251) Last BM Date: 01/23/19 Filed Weights   01/21/19 1413 01/22/19 1251  Weight: 100 kg 103.9 kg   Not examined.    Intake/Output from previous day: 01/12 0701 - 01/13 0700 In: 2252.1 [P.O.:150; I.V.:1217.1; Blood:885] Out: 475 [Urine:475]  Intake/Output this shift: No intake/output data recorded.  Lab Results: Recent Labs    01/21/19 2151 01/22/19 0948 01/22/19 1800 01/23/19 0724  WBC 12.5* 14.6*  14.8*  --  9.5  HGB 6.6* 9.4*  9.3* 9.8* 9.1*  HCT 20.7* 28.3*  28.0* 29.8* 27.7*  PLT 147* 109*  109*  --  96*   BMET Recent Labs    01/21/19 2151 01/22/19 0948 01/23/19 0724  NA 139 138 140  K 4.7 5.1 4.5  CL 111 112* 112*  CO2 19* 18* 20*  GLUCOSE 235* 226* 163*  BUN 63* 77* 79*  CREATININE 2.10* 2.65* 2.77*  CALCIUM 7.6* 7.3* 7.5*   LFT Recent Labs    01/20/19 1446 01/21/19 0500  PROT 5.4* 4.4*  ALBUMIN 2.5* 2.1*  AST 21 16  ALT 35 27  ALKPHOS 55 41  BILITOT 0.7 0.6  BILIDIR <0.1  --   IBILI NOT CALCULATED  --    PT/INR Recent Labs    01/21/19 1457  LABPROT 14.3  INR 1.1   Hepatitis Panel No results for input(s): HEPBSAG, HCVAB, HEPAIGM, HEPBIGM in the last 72 hours.  Studies/Results: No results found.  ASSESMENT:   *  Upper GI bleed.  On daily PPI at home for H pylori negative gastritis on EGD  09/2017.  01/22/19 EGD: small HH, gastritis, 3 DUs one w VV was treated w epi and 2 clips.   PPI drip in place.    *   Blood loss and anemia of chronic kidney dz in transplanted kidney.   Hgb 6,6 PRBCs >> 9.8 >> 9.1.    *   Thrombocytopenia.  INR normal.    *   Hepatic steatosis per CTAP w/o contrast in 06/2017. No stigmata of liver dz on EGD yesterday.      PLAN   *  Finishes 72 hours PPI drip today at 1600.   Protonix 40 mg po bid thereafter.    fup w GI in ~ 3 weeks, Dr Lyndel Safe or APP.    *   Stool H pylori Ag ordered, treat with abx if positive.       Azucena Freed  01/23/2019, 10:20 AM Phone [phone]

## 2019-01-23 NOTE — Consult Note (Signed)
[facility] Renal Consultation Note  Requesting MD: Broadus John Indication for Consultation: s/p renal transplant- ckd- many hosps of late  HPI:  Jon [name] is a 69 y.o. 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  Followed by Jon [name] at [facility]-he does have some allograft dysfunction and creatinine runs in the low twos.  He also has hyperthyroidism, diabetes, hyperlipidemia.  Seen at [facility] on November 9 creatinine was 2.4 and he was stable.  Patient developed Covid, was hospitalized from 12/17 to 12/21 for symptom management.  Regarding kidneys, he did have some acute on chronic renal failure with initial creatinine 3.4 that improved to around 2 at the time of discharge.  I guess he was felt to be dry so diuretics were held as well as Bactrim.  Unfortunately, he had return to the hospital with complaints of worsening shortness of breath.  Chest x-ray showing interstitial changes either infectious or fluid- this was felt to be symptomatic COVID.  Creatinine was 2.95 but improved to 2.0 at time of discharge on 1/1.  Unfortunately returned again on 1/10 with dizziness/DOE/black stools- orthostatics were positive and he was found to have a 3 gram hgb drop- underwent EGD found actively bleeding ulcer- treated-  Required 6 units of blood- has had some low BPs.  UOP not well recorded but looks to be reasonable the last 24 hours.  Crt was 1.93 on admission -  Has worsened slowly over the last 3 days, is 2.77 today so I am asked to consult.  He is on his home reg of myfortic 180 BID, prograf 2 mg BID and pred 5 ( had been on higher dosing previously due to COVID)    Creat  Date/Time Value Ref Range Status  12/22/2016 02:42 PM 2.21 (H) 0.70 - 1.25 mg/dL Final    Comment:    For patients >72 years of age, the reference limit for Creatinine is approximately 13% higher for people identified as African-American. .     Creatinine, Ser  Date/Time Value Ref Range Status  01/23/2019 07:24 AM 2.77 (H) 0.61 - 1.24 mg/dL Final  01/22/2019 09:48 AM 2.65 (H) 0.61 - 1.24 mg/dL Final  01/21/2019 09:51 PM 2.10 (H) 0.61 - 1.24 mg/dL Final  01/21/2019 05:00 AM 1.93 (H) 0.61 - 1.24 mg/dL Final  01/20/2019 01:43 PM 2.04 (H) 0.61 - 1.24 mg/dL Final  01/11/2019 03:02 AM 2.06 (H) 0.61 - 1.24 mg/dL Final  01/10/2019 05:14 AM 2.53 (H) 0.61 - 1.24 mg/dL Final  01/09/2019 03:32 AM 2.45 (H) 0.61 - 1.24 mg/dL Final  01/08/2019 02:17 PM 2.79 (H) 0.61 - 1.24 mg/dL Final  01/08/2019 03:48 AM 2.90 (H) 0.61 - 1.24 mg/dL Final  01/07/2019 05:00 AM 2.68 (H) 0.61 - 1.24 mg/dL Final  01/06/2019 04:26 AM 2.35 (H) 0.61 - 1.24 mg/dL Final  01/04/2019 08:00 AM 2.66 (H) 0.61 - 1.24 mg/dL Final  01/04/2019 12:11 AM 2.95 (H) 0.61 - 1.24 mg/dL Final  12/31/2018 04:03 AM 2.08 (H) 0.61 - 1.24 mg/dL Final  12/30/2018 07:31 AM 1.97 (H) 0.61 - 1.24 mg/dL Final  12/29/2018 05:42 AM 2.69 (H) 0.61 - 1.24 mg/dL Final  12/28/2018 03:25 AM 3.25 (H) 0.61 - 1.24 mg/dL Final  12/27/2018 02:11 PM 3.42 (H) 0.61 - 1.24 mg/dL Final  07/26/2017 02:51 PM 1.60 (H) 0.61 - 1.24 mg/dL Final  06/18/2017 12:46 PM 2.57 (H) 0.61 - 1.24 mg/dL Final  06/05/2017 03:39 AM 2.60 (H) 0.61 - 1.24 mg/dL Final  06/04/2017 08:38 AM 3.56 (H) 0.61 - 1.24 mg/dL Final  01/04/2017 04:44 AM 2.74 (H) 0.61 - 1.24 mg/dL Final  01/03/2017 03:51 AM 2.72 (H) 0.61 - 1.24 mg/dL Final  01/02/2017 12:30 AM 2.44 (H) 0.61 - 1.24 mg/dL Final  01/01/2017 07:00 PM 2.54 (H) 0.61 - 1.24 mg/dL Final  10/01/2015 11:15 AM 1.86 (H) 0.61 - 1.24 mg/dL Final  04/19/2015 09:10 AM 2.39 (H) 0.61 - 1.24 mg/dL Final  03/16/2013 03:02 AM 4.71 (H) 0.50 - 1.35 mg/dL Final  12/28/2012 05:00 AM 4.01 (H) 0.50 - 1.35 mg/dL Final  12/20/2012 10:32 AM 4.20 (H) 0.50 - 1.35 mg/dL Final  01/27/2011 05:45 AM 1.98 (H) 0.50 - 1.35 mg/dL Final  01/26/2011 05:49 AM 1.89 (H) 0.50 - 1.35 mg/dL Final  01/25/2011 02:18 PM  1.93 (H) 0.50 - 1.35 mg/dL Final  01/25/2011 06:50 AM 1.81 (H) 0.50 - 1.35 mg/dL Final  01/24/2011 12:42 AM 2.12 (H) 0.50 - 1.35 mg/dL Final  01/23/2011 01:00 PM 2.09 (H) 0.50 - 1.35 mg/dL Final  01/22/2011 09:29 PM 1.87 (H) 0.50 - 1.35 mg/dL Final     PMHx:   Past Medical History:  Diagnosis Date  . Abnormal gait   . Anemia of chronic disease   . Arthritis    back, hands   . Chronic back pain    radiculopathy and stenosis  . Chronic ischemic heart disease    Severe LVH  . Chronic kidney disease    [name], awaiting Transplant   . Congestive heart failure (CHF) ([place])   . CVA (cerebral infarction) 03/2013  . Diabetes mellitus     Lantus nightly ;type 2  . GERD (gastroesophageal reflux disease)   . H/O hiatal hernia   . History of colon polyps   . Hx of cardiovascular stress test    a.  Lexiscan Myoview (05/2013):  No ischemia, EF 53%; Normal Study  . Hyperlipidemia    takes Lovastatin daily  . Hypertension    takes Amlodipine and Catapres daily    dr Forrestine Him, Loop Recorder - [facility]  . Nocturia   . Obesity   . Peripheral edema    takes Lasix daily  . Sleep apnea    cpap , study in their home, 09/2102- [facility]           . Stroke (HCC)    speech, rt arm weakness  . Urinary frequency     Past Surgical History:  Procedure Laterality Date  . AV FISTULA PLACEMENT Left 04/03/2013   Procedure: ARTERIOVENOUS (AV) FISTULA CREATION- LEFT RADIOCEPHALIC VS BRACHIOCEPHALIC;  Surgeon: Conrad [place], MD;  Location: [facility];  Service: Vascular;  Laterality: Left;  . AV FISTULA PLACEMENT Left 05/14/2013   Procedure: LEFT BRACHIOCEPHALIC ARTERIOVENOUS (AV) FISTULA CREATION;  Surgeon: Conrad , MD;  Location: [facility];  Service: Vascular;  Laterality: Left;  . BACK SURGERY  12/2012   2003- 1st back surgery & then 2014- fusion  . COLONOSCOPY    . ESOPHAGOGASTRODUODENOSCOPY    . ESOPHAGOGASTRODUODENOSCOPY (EGD) WITH PROPOFOL N/A 01/22/2019   Procedure: ESOPHAGOGASTRODUODENOSCOPY  (EGD) WITH PROPOFOL;  Surgeon: [name], MD;  Location: [facility];  Service: Gastroenterology;  Laterality: N/A;  . HEMORRHOID SURGERY    . HEMOSTASIS CLIP PLACEMENT  01/22/2019   Procedure: HEMOSTASIS CLIP PLACEMENT;  Surgeon: [name], MD;  Location: [facility] ENDOSCOPY;  Service: Gastroenterology;;  . KIDNEY TRANSPLANT  10/01/2013  . left knee surgery    . LOOP RECORDER IMPLANT  03/19/13   MDT  LinQ implanted by Dr Rayann Heman for cryptogenic stroke  . LOOP RECORDER IMPLANT N/A 03/19/2013   Procedure: LOOP RECORDER IMPLANT;  Surgeon: Coralyn Mark, MD;  Location: [facility] CATH LAB;  Service: Cardiovascular;  Laterality: N/A;  . NEPHRECTOMY TRANSPLANTED ORGAN    . NEUROPLASTY / TRANSPOSITION MEDIAN NERVE AT CARPAL TUNNEL BILATERAL    . right leg surgery     pin in place  . right wrist surgery    . SCLEROTHERAPY  01/22/2019   Procedure: Clide Deutscher;  Surgeon: [name], MD;  Location: [facility] ENDOSCOPY;  Service: Gastroenterology;;  . TEE WITHOUT CARDIOVERSION N/A 03/19/2013   Procedure: TRANSESOPHAGEAL ECHOCARDIOGRAM (TEE);  Surgeon: [name], MD;  Location: [facility] ENDOSCOPY;  Service: Cardiovascular;  Laterality: N/A;  . THYROID SURGERY  10/13/2015   Performed at [facility] with surgical pathology revealed consistent with benign follicular nodule ([name] category II)    Family Hx:  Family History  Problem Relation Age of Onset  . Hypertension Mother   . Hyperlipidemia Father   . Hypertension Father   . Heart disease Father        before age 34  . Renal Disease Father   . Diabetes Brother   . Hyperlipidemia Son     Social History:  reports that he quit smoking about 6 years ago. His smoking use included cigarettes. He has a 20.00 pack-year smoking history. He has never used smokeless tobacco. He reports that he does not drink alcohol or use drugs.  Allergies:  Allergies  Allergen Reactions  . Lisinopril Other (See Comments)    Increased potassium level   . Meloxicam Nausea And  Vomiting  . Victoza [Liraglutide] Diarrhea, Nausea And Vomiting and Swelling    Medications: Prior to Admission medications   Medication Sig Start Date End Date Taking? Authorizing Provider  aspirin EC 81 MG tablet Take 81 mg by mouth daily.    Yes [provider]  atorvastatin (LIPITOR) 20 MG tablet Take 40 mg by mouth at bedtime. 11/26/18  Yes [provider]  calcitRIOL (ROCALTROL) 0.25 MCG capsule Take 0.25 mcg by mouth See admin instructions. Take 1 capsule (0.25 mcg) by mouth five times weekly - Monday thru Friday   Yes [provider]  furosemide (LASIX) 40 MG tablet Take 1 tablet (40 mg total) by mouth daily. 01/11/19 01/11/20 Yes Eugenie Filler, MD  gabapentin (NEURONTIN) 300 MG capsule Take 300 mg by mouth 2 (two) times daily.   Yes [provider]  HYDROcodone-acetaminophen (NORCO) 10-325 MG tablet Take 1-2 tablets by mouth every 4 (four) hours as needed for moderate pain.  12/26/16  Yes [provider]  insulin glargine (LANTUS) 100 unit/mL SOPN Inject 24 Units into the skin at bedtime.    Yes [provider]  insulin lispro (HUMALOG KWIKPEN) 100 UNIT/ML KwikPen Inject 5-15 Units into the skin See admin instructions. Inject 5 units subcutaneously three times daily before meals adjusted per carb count  ( add 2 units for every 25 points)   Yes [provider]  labetalol (NORMODYNE) 100 MG tablet Take 100 mg by mouth 2 (two) times daily.   Yes [provider]  methimazole (TAPAZOLE) 5 MG tablet Take 2.5 mg by mouth daily.  06/29/17  Yes [provider]  Multiple Vitamin (MULTIVITAMIN WITH MINERALS) TABS tablet Take 1 tablet by mouth daily.   Yes [provider]  mycophenolate (MYFORTIC) 180 MG EC tablet Take 180 mg by mouth 2 (two) times daily.    Yes  [provider]  pantoprazole (PROTONIX) 40 MG tablet Take 1 tablet (40 mg total) by mouth daily. 01/11/19  Yes Eugenie Filler, MD   predniSONE (DELTASONE) 5 MG tablet Take 1 tablet (5 mg total) by mouth daily with breakfast. 01/17/19  Yes Eugenie Filler, MD  [facility] Wort 300 MG TABS Take 300 mg by mouth 3 (three) times daily.   Yes [provider]  tacrolimus (PROGRAF) 1 MG capsule Take 2 mg by mouth 2 (two) times daily. 06/28/17  Yes [provider]  tamsulosin (FLOMAX) 0.4 MG CAPS capsule Take 0.4 mg by mouth at bedtime.   Yes [provider]  TRADJENTA 5 MG TABS tablet Take 5 mg by mouth daily. 04/17/18  Yes [provider]  vitamin B-12 (CYANOCOBALAMIN) 1000 MCG tablet Take 1,000 mcg by mouth 2 (two) times daily.    Yes [provider]  loperamide (IMODIUM) 2 MG capsule Take 1 capsule (2 mg total) by mouth every 12 (twelve) hours. 12/31/18   Nita Sells, MD    I have reviewed the patient's current medications.  Labs:  Results for orders placed or performed during the hospital encounter of 01/20/19 (from the past 48 hour(s))  Culture, blood (routine x 2)     Status: None (Preliminary result)   Collection Time: 01/21/19  9:32 PM   Specimen: BLOOD RIGHT HAND  Result Value Ref Range   Specimen Description BLOOD RIGHT HAND    Special Requests      BOTTLES DRAWN AEROBIC AND ANAEROBIC Blood Culture results may not be optimal due to an inadequate volume of blood received in culture bottles   Culture      NO GROWTH 2 DAYS Performed at [facility] [address], [place], [place] [address]    Report Status PENDING   Culture, blood (routine x 2)     Status: None (Preliminary result)   Collection Time: 01/21/19  9:43 PM   Specimen: BLOOD  Result Value Ref Range   Specimen Description BLOOD RIGHT THUMB    Special Requests      BOTTLES DRAWN AEROBIC AND ANAEROBIC Blood Culture results may not be optimal due to an inadequate volume of blood received in culture bottles   Culture      NO GROWTH 2 DAYS Performed at [facility], [place] [address]  [place], [place] [address]    Report Status PENDING   CBC     Status: Abnormal   Collection Time: 01/21/19  9:51 PM  Result Value Ref Range   WBC 12.5 (H) 4.0 - 10.5 K/uL   RBC 2.14 (L) 4.22 - 5.81 MIL/uL   Hemoglobin 6.6 (LL) 13.0 - 17.0 g/dL    Comment: REPEATED TO VERIFY THIS CRITICAL RESULT HAS VERIFIED AND BEEN CALLED TO RN [place] ON 01 11 2021 AT 2232, AND HAS BEEN READ BACK.     HCT 20.7 (L) 39.0 - 52.0 %   MCV 96.7 80.0 - 100.0 fL   MCH 30.8 26.0 - 34.0 pg   MCHC 31.9 30.0 - 36.0 g/dL   RDW 15.4 11.5 - 15.5 %   Platelets 147 (L) 150 - 400 K/uL   nRBC 0.0 0.0 - 0.2 %    Comment: Performed at [facility] [address] [place], [place] [address]  Lactic acid, plasma     Status: Abnormal   Collection Time: 01/21/19  9:51 PM  Result Value Ref Range  Lactic Acid, Venous 2.7 (HH) 0.5 - 1.9 mmol/L    Comment: CRITICAL RESULT CALLED TO, READ BACK BY AND VERIFIED WITH: MITCHELL C,RN 01/21/19 2231 WAYK Performed at [facility], [place] [address], [place], [place] [address]   Basic metabolic panel     Status: Abnormal   Collection Time: 01/21/19  9:51 PM  Result Value Ref Range   Sodium 139 135 - 145 mmol/L   Potassium 4.7 3.5 - 5.1 mmol/L   Chloride 111 98 - 111 mmol/L   CO2 19 (L) 22 - 32 mmol/L   Glucose, Bld 235 (H) 70 - 99 mg/dL   BUN 63 (H) 8 - 23 mg/dL   Creatinine, Ser 2.10 (H) 0.61 - 1.24 mg/dL   Calcium 7.6 (L) 8.9 - 10.3 mg/dL   GFR calc non Af Amer 32 (L) >60 mL/min   GFR calc Af Amer 37 (L) >60 mL/min   Anion gap 9 5 - 15    Comment: Performed at [facility] [address] [place], [place] [address]  Glucose, capillary     Status: Abnormal   Collection Time: 01/21/19 10:18 PM  Result Value Ref Range   Glucose-Capillary 214 (H) 70 - 99 mg/dL  Prepare RBC     Status: None   Collection Time: 01/21/19 10:36 PM  Result Value Ref Range   Order Confirmation      ORDER PROCESSED BY BLOOD BANK Performed at [facility], [place] [address], [place], [place] [address]   Glucose, capillary     Status: Abnormal   Collection Time: 01/22/19  8:20 AM  Result Value Ref Range   Glucose-Capillary 216 (H) 70 - 99 mg/dL  CBC     Status: Abnormal   Collection Time: 01/22/19  9:48 AM  Result Value Ref Range   WBC 14.8 (H) 4.0 - 10.5 K/uL   RBC 3.09 (L) 4.22 - 5.81 MIL/uL   Hemoglobin 9.3 (L) 13.0 - 17.0 g/dL    Comment: REPEATED TO VERIFY POST TRANSFUSION SPECIMEN    HCT 28.0 (L) 39.0 - 52.0 %   MCV 90.6 80.0 - 100.0 fL   MCH 30.1 26.0 - 34.0 pg   MCHC 33.2 30.0 - 36.0 g/dL   RDW 15.9 (H) 11.5 - 15.5 %   Platelets 109 (L) 150 - 400 K/uL    Comment: REPEATED TO VERIFY PLATELET COUNT CONFIRMED BY SMEAR Immature Platelet Fraction may be clinically indicated, consider ordering this additional test [id number]    nRBC 0.3 (H) 0.0 - 0.2 %    Comment: Performed at [facility], [place] [address], [place], [place] [address]  Basic metabolic panel     Status: Abnormal   Collection Time: 01/22/19  9:48 AM  Result Value Ref Range   Sodium 138 135 - 145 mmol/L   Potassium 5.1 3.5 - 5.1 mmol/L   Chloride 112 (H) 98 - 111 mmol/L   CO2 18 (L) 22 - 32 mmol/L   Glucose, Bld 226 (H) 70 - 99 mg/dL   BUN 77 (H) 8 - 23 mg/dL   Creatinine, Ser 2.65 (H) 0.61 - 1.24 mg/dL   Calcium 7.3 (L) 8.9 - 10.3 mg/dL   GFR calc non Af Amer 24 (L) >60 mL/min   GFR calc Af Amer 28 (L) >60 mL/min   Anion gap 8 5 - 15    Comment: Performed at [facility] [address] [place], [place] [address]  CBC  Status: Abnormal   Collection Time: 01/22/19  9:48 AM  Result Value Ref Range   WBC 14.6 (H) 4.0 - 10.5 K/uL   RBC 3.13 (L) 4.22 - 5.81 MIL/uL   Hemoglobin 9.4 (L) 13.0 - 17.0 g/dL    Comment: REPEATED TO VERIFY   HCT 28.3 (L) 39.0 - 52.0 %   MCV 90.4 80.0 - 100.0 fL   MCH 30.0 26.0 - 34.0 pg   MCHC 33.2 30.0 - 36.0 g/dL   RDW 15.9 (H) 11.5 - 15.5 %   Platelets 109 (L) 150 - 400 K/uL    Comment: REPEATED TO  VERIFY Immature Platelet Fraction may be clinically indicated, consider ordering this additional test [id number] CONSISTENT WITH PREVIOUS RESULT    nRBC 0.3 (H) 0.0 - 0.2 %    Comment: Performed at [facility], [place] [address], [place], [place] [address]  Glucose, capillary     Status: Abnormal   Collection Time: 01/22/19 11:37 AM  Result Value Ref Range   Glucose-Capillary 191 (H) 70 - 99 mg/dL  Glucose, capillary     Status: Abnormal   Collection Time: 01/22/19  4:47 PM  Result Value Ref Range   Glucose-Capillary 229 (H) 70 - 99 mg/dL  Hemoglobin and hematocrit, blood     Status: Abnormal   Collection Time: 01/22/19  6:00 PM  Result Value Ref Range   Hemoglobin 9.8 (L) 13.0 - 17.0 g/dL   HCT 29.8 (L) 39.0 - 52.0 %    Comment: Performed at [facility] [address] [place], [place] [address]  Prepare RBC     Status: None   Collection Time: 01/22/19  7:00 PM  Result Value Ref Range   Order Confirmation      ORDER PROCESSED BY BLOOD BANK Performed at [facility], [place] [address], [place], [place] [address]   Glucose, capillary     Status: Abnormal   Collection Time: 01/22/19  8:27 PM  Result Value Ref Range   Glucose-Capillary 192 (H) 70 - 99 mg/dL  Basic metabolic panel     Status: Abnormal   Collection Time: 01/23/19  7:24 AM  Result Value Ref Range   Sodium 140 135 - 145 mmol/L   Potassium 4.5 3.5 - 5.1 mmol/L   Chloride 112 (H) 98 - 111 mmol/L   CO2 20 (L) 22 - 32 mmol/L   Glucose, Bld 163 (H) 70 - 99 mg/dL   BUN 79 (H) 8 - 23 mg/dL   Creatinine, Ser 2.77 (H) 0.61 - 1.24 mg/dL   Calcium 7.5 (L) 8.9 - 10.3 mg/dL   GFR calc non Af Amer 23 (L) >60 mL/min   GFR calc Af Amer 26 (L) >60 mL/min   Anion gap 8 5 - 15    Comment: Performed at [facility] [address] [place], [place] [address]  CBC     Status: Abnormal   Collection Time: 01/23/19  7:24 AM  Result Value Ref Range   WBC 9.5 4.0 - 10.5 K/uL   RBC 3.16 (L) 4.22 - 5.81 MIL/uL    Hemoglobin 9.1 (L) 13.0 - 17.0 g/dL   HCT 27.7 (L) 39.0 - 52.0 %   MCV 87.7 80.0 - 100.0 fL   MCH 28.8 26.0 - 34.0 pg   MCHC 32.9 30.0 - 36.0 g/dL   RDW 19.9 (H) 11.5 - 15.5 %   Platelets 96 (L) 150 - 400 K/uL    Comment: REPEATED TO VERIFY  Immature Platelet Fraction may be clinically indicated, consider ordering this additional test [id number] CONSISTENT WITH PREVIOUS RESULT    nRBC 0.2 0.0 - 0.2 %    Comment: Performed at [facility], [place] [address], [place], [place] [address]  Glucose, capillary     Status: Abnormal   Collection Time: 01/23/19  7:34 AM  Result Value Ref Range   Glucose-Capillary 145 (H) 70 - 99 mg/dL  Glucose, capillary     Status: Abnormal   Collection Time: 01/23/19 11:45 AM  Result Value Ref Range   Glucose-Capillary 217 (H) 70 - 99 mg/dL   Comment 1 Document in Chart   Glucose, capillary     Status: Abnormal   Collection Time: 01/23/19  5:22 PM  Result Value Ref Range   Glucose-Capillary 177 (H) 70 - 99 mg/dL     ROS:  Review of systems not obtained due to patient factors.  Physical Exam: Vitals:   01/23/19 0800 01/23/19 1259  BP: 126/62 128/62  Pulse:    Resp: 16 15  Temp:  97.7 F (36.5 C)  SpO2: 98% 96%     PE deferred due to COVID positive status  Assessment/Plan: 69 year old white male status post renal transplant with some CKD and creatinine of 2.  Recent Covid infection treated now presents with GIB  1.Renal- patient is status post renal transplant in 2015.  His baseline creatinine is around 2 at best.  Therefore, his kidney function was around his baseline upon admission.  True that it appears to be trending worse last couple of days.  Likely hemodynamically mediated with the significant GIB and procedures as well as elevated lactate.   He is making urine.  Does not appear to be on review of the chart uremic.  Will check urine- doubt something like rejection.  continue to follow for support 2.  Status post renal  transplant-continued on his home medications of Prograf 2 mg twice daily and Myfortic 180 mg twice daily as well as prednisone 3.  GIB-  Significant-  Required transfusions and intervention on EGD-  Hopefully stabilizing and kidney function will follow  4.  Covid infection- Per hospitalist.  this seems better. Seems to be past his 21 days-  Does he still need to be in isolation ?? 5. Hypertension/volume  -blood pressure soft.  No BP meds and no diuretics, would continue same 6.  Hyperkalemia-has been an issue for him in the past..... follow      Louis Meckel 01/23/2019, 5:44 PM

## 2019-01-23 NOTE — Progress Notes (Signed)
PROGRESS NOTE    Jon [name]  CVE:[id number] DOB: 01-23-1950 DOA: 01/20/2019 PCP: [name], MD  Brief Narrative: Jon [name] is a 69 y.o. male with medical history significant of stage IV chronic kidney disease, status post renal transplant on chronic immunosuppression, type 2 diabetes mellitus, hypertension, history of CVA, recent discharge from the hospital 1/1 after treatment for Covid pneumonia presented to the emergency room with weakness and low blood pressure for the last couple of days. -Patient was originally diagnosed with COVID-19 on 12/17, then he was admitted with GI symptoms and AKI, discharged home on 12/21, readmitted on 12/25 with hypoxia/Covid pneumonia, treated with IV steroids and remdesivir, discharged home on steroid taper back to his home regimen of 5 mg of prednisone. -Presented to the emergency room with worsening weakness, dizziness, low blood pressure.  Also history of melena few days ago  ED Course: Blood pressure was 101 on arrival, this dropped to 70 on standing, labs noted 2.5-3 g drop in hemoglobin from a week ago, chest x-ray noted patchy interstitial opacities unchanged from prior, Hemoccult noted heme positive black stools, GI consulted on admission -Overnight 1/12 with frequent melena, hemoglobin down to 6.6gm/dl,   6 episodes of melena reported, 3 units of PRBC transfused, EGD noted 3 duodenal ulcers, one with visible active bleeding vessel which was treated with epi and 2 clips -complicated by mild worsening of kidney function  Assessment & Plan:   Upper GI bleed/melena Acute blood loss anemia/actively bleeding duodenal ulcer Transient Hemorrhagic shock -Secondary to bleeding duodenal ulcers, EGD 1/12 afternoon with gastritis, 3 duodenal ulcers, with 1 actively bleeding with visible vessel was treated with epi and 2 clips -Completed Protonix drip today, add IV PPI every 12 -Monitor for recurrent bleeding, watch hemoglobin closely -Patient  had profuse bleeding overnight, early a.m. hours of 1/12, with 6 episodes of melena and hypotension requiring 3 units of PRBC in few hours -Total of 5 units PRBCs transfused -Monitor hemoglobin every 12  AKI on CKD stage IV/status post cadaveric renal transplant in 2015 -Baseline creatinine is 2-2.5 -Creatinine was 2.0 on admission, trended up to 2.6 and now 2.7 , still close to baseline, likely hemodynamically mediated from profuse GI bleeding and transient hypotension early hours of 1/12  -Does not appear volume overloaded, strict I's and O's requested -Blood pressure improved now and hemoglobin finally stable today  -Nephrology input requested, hopefully kidney function does not worsen  -Continue Prograf, CellCept and prednisone, Lasix on hold   Type 2 diabetes mellitus with hyperglycemia -Recent hemoglobin A1c was 7.8 -Continue Lantus at lower dose, CBGs stable, SSI  Recent Covid pneumonia -originally diagnosed with COVID-19 on 12/17 and hospitalized from 12/24-1/1 -Completed treatment with IV remdesivir and Decadron -Improved from that standpoint, given his chronically immunosuppressed state he would need a 4-week course of isolation which will be completed on 1/14 -Chest x-ray stable -Continue airborne/contact precautions until 1/14  Hyperthyroidism -Continue methimazole  History of CVA -Hold aspirin  DVT prophylaxis: SCDs Code Status: Full code per patient's wishes Family Communication: No family at bedside, called and updated wife Nayshawn Mesta 1/12 Disposition Plan:  Keep in progressive care today  Home pending resolution of GI bleed, improvement in kidney function  Consults called: [facility], nephrology Dr. Moshe Cipro  Consultants:   [facility]   Procedures:   Antimicrobials:    Subjective: -Denies any melena overnight, reports that his stool this morning was dark brown -Denies any dyspnea  Objective: Vitals:   01/23/19  0400 01/23/19 0500 01/23/19  0800 01/23/19 1259  BP: (!) 94/57  126/62 128/62  Pulse:      Resp: 14  16 15   Temp:  98.2 F (36.8 C)  97.7 F (36.5 C)  TempSrc:  Axillary  Oral  SpO2: 98%  98% 96%  Weight:      Height:        Intake/Output Summary (Last 24 hours) at 01/23/2019 1457 Last data filed at 01/23/2019 1240 Gross per 24 hour  Intake 625.92 ml  Output 955 ml  Net -329.08 ml   Filed Weights   01/21/19 1413 01/22/19 1251  Weight: 100 kg 103.9 kg    Examination:  Gen: Chronically ill-appearing male, sitting up in bed, AAOx3, appears very weak HEENT: PERRLA, Neck supple, no JVD Lungs: Poor air movement, otherwise clear CVS: RRR,No Gallops,Rubs or new Murmurs Abd: soft, Non tender, non distended, BS present Extremities: No edema Skin: no new rashes Psychiatry: Mood & affect appropriate.     Data Reviewed:   CBC: Recent Labs  Lab 01/21/19 0500 01/21/19 1457 01/21/19 2151 01/22/19 0948 01/22/19 1800 01/23/19 0724  WBC 8.4 10.6* 12.5* 14.6*  14.8*  --  9.5  HGB 8.0* 7.8* 6.6* 9.4*  9.3* 9.8* 9.1*  HCT 25.2* 24.6* 20.7* 28.3*  28.0* 29.8* 27.7*  MCV 96.6 96.9 96.7 90.4  90.6  --  87.7  PLT 124* 139* 147* 109*  109*  --  96*   Basic Metabolic Panel: Recent Labs  Lab 01/20/19 1343 01/21/19 0500 01/21/19 2151 01/22/19 0948 01/23/19 0724  NA 142 140 139 138 140  K 4.3 4.7 4.7 5.1 4.5  CL 109 110 111 112* 112*  CO2 24 21* 19* 18* 20*  GLUCOSE 100* 158* 235* 226* 163*  BUN 53* 55* 63* 77* 79*  CREATININE 2.04* 1.93* 2.10* 2.65* 2.77*  CALCIUM 8.6* 7.9* 7.6* 7.3* 7.5*   GFR: Estimated Creatinine Clearance: 31.7 mL/min (A) (by C-G formula based on SCr of 2.77 mg/dL (H)). Liver Function Tests: Recent Labs  Lab 01/20/19 1446 01/21/19 0500  AST 21 16  ALT 35 27  ALKPHOS 55 41  BILITOT 0.7 0.6  PROT 5.4* 4.4*  ALBUMIN 2.5* 2.1*   No results for input(s): LIPASE, AMYLASE in the last 168 hours. No results for input(s): AMMONIA in the last 168 hours. Coagulation  Profile: Recent Labs  Lab 01/21/19 1457  INR 1.1   Cardiac Enzymes: No results for input(s): CKTOTAL, CKMB, CKMBINDEX, TROPONINI in the last 168 hours. BNP (last 3 results) No results for input(s): PROBNP in the last 8760 hours. HbA1C: No results for input(s): HGBA1C in the last 72 hours. CBG: Recent Labs  Lab 01/22/19 1137 01/22/19 1647 01/22/19 2027 01/23/19 0734 01/23/19 1145  GLUCAP 191* 229* 192* 145* 217*   Lipid Profile: No results for input(s): CHOL, HDL, LDLCALC, TRIG, CHOLHDL, LDLDIRECT in the last 72 hours. Thyroid Function Tests: No results for input(s): TSH, T4TOTAL, FREET4, T3FREE, THYROIDAB in the last 72 hours. Anemia Panel: No results for input(s): VITAMINB12, FOLATE, FERRITIN, TIBC, IRON, RETICCTPCT in the last 72 hours. Urine analysis:    Component Value Date/Time   COLORURINE YELLOW 12/28/2018 0300   APPEARANCEUR HAZY (A) 12/28/2018 0300   LABSPEC 1.019 12/28/2018 0300   PHURINE 5.0 12/28/2018 0300   GLUCOSEU NEGATIVE 12/28/2018 0300   HGBUR SMALL (A) 12/28/2018 0300   BILIRUBINUR NEGATIVE 12/28/2018 0300   KETONESUR NEGATIVE 12/28/2018 0300   PROTEINUR 30 (A) 12/28/2018 0300   UROBILINOGEN 0.2 01/23/2011 0551  NITRITE NEGATIVE 12/28/2018 0300   LEUKOCYTESUR NEGATIVE 12/28/2018 0300   Sepsis Labs: @LABRCNTIP (procalcitonin:4,lacticidven:4)  ) Recent Results (from the past 240 hour(s))  Culture, blood (routine x 2)     Status: None (Preliminary result)   Collection Time: 01/21/19  9:32 PM   Specimen: BLOOD RIGHT HAND  Result Value Ref Range Status   Specimen Description BLOOD RIGHT HAND  Final   Special Requests   Final    BOTTLES DRAWN AEROBIC AND ANAEROBIC Blood Culture results may not be optimal due to an inadequate volume of blood received in culture bottles   Culture   Final    NO GROWTH 2 DAYS Performed at [facility], [place] [address], [place], [place] [address]    Report Status PENDING  Incomplete  Culture, blood (routine x  2)     Status: None (Preliminary result)   Collection Time: 01/21/19  9:43 PM   Specimen: BLOOD  Result Value Ref Range Status   Specimen Description BLOOD RIGHT THUMB  Final   Special Requests   Final    BOTTLES DRAWN AEROBIC AND ANAEROBIC Blood Culture results may not be optimal due to an inadequate volume of blood received in culture bottles   Culture   Final    NO GROWTH 2 DAYS Performed at [facility], [place] [address], [place], [place] [address]    Report Status PENDING  Incomplete         Radiology Studies: No results found.      Scheduled Meds: . atorvastatin  40 mg Oral QHS  . gabapentin  300 mg Oral QHS  . insulin aspart  0-9 Units Subcutaneous TID WC  . insulin glargine  12 Units Subcutaneous QHS  . methimazole  2.5 mg Oral Daily  . multivitamin with minerals  1 tablet Oral Daily  . mycophenolate  180 mg Oral BID  . pantoprazole (PROTONIX) IV  40 mg Intravenous Q12H  . predniSONE  5 mg Oral Q breakfast  . tacrolimus  2 mg Oral BID  . tamsulosin  0.4 mg Oral QHS   Continuous Infusions: . pantoprozole (PROTONIX) infusion Stopped (01/23/19 1415)     LOS: 3 days    Time spent: 75min  Domenic Polite, MD [facility] 01/23/2019, 2:57 PM

## 2019-01-24 DIAGNOSIS — E860 Dehydration: Secondary | ICD-10-CM

## 2019-01-24 DIAGNOSIS — U071 COVID-19: Secondary | ICD-10-CM

## 2019-01-24 DIAGNOSIS — I639 Cerebral infarction, unspecified: Secondary | ICD-10-CM

## 2019-01-24 LAB — GLUCOSE, CAPILLARY
Glucose-Capillary: 134 mg/dL — ABNORMAL HIGH (ref 70–99)
Glucose-Capillary: 171 mg/dL — ABNORMAL HIGH (ref 70–99)
Glucose-Capillary: 216 mg/dL — ABNORMAL HIGH (ref 70–99)
Glucose-Capillary: 174 mg/dL — ABNORMAL HIGH (ref 70–99)

## 2019-01-24 LAB — BPAM RBC
Blood Product Expiration Date: 202101302359
Blood Product Expiration Date: 202102082359
Blood Product Expiration Date: 202102082359
Blood Product Expiration Date: 202102082359
Blood Product Expiration Date: 202102082359
Blood Product Expiration Date: 202102092359
ISSUE DATE / TIME: 202101102228
ISSUE DATE / TIME: 202101112305
ISSUE DATE / TIME: 202101120215
ISSUE DATE / TIME: 202101120525
ISSUE DATE / TIME: 202101121842
Unit Type and Rh: 5100
Unit Type and Rh: 5100
Unit Type and Rh: 5100
Unit Type and Rh: 5100
Unit Type and Rh: 5100
Unit Type and Rh: 5100

## 2019-01-24 LAB — URINALYSIS, ROUTINE W REFLEX MICROSCOPIC
Bilirubin Urine: NEGATIVE
Glucose, UA: NEGATIVE mg/dL
Hgb urine dipstick: NEGATIVE
Ketones, ur: NEGATIVE mg/dL
Leukocytes,Ua: NEGATIVE
Nitrite: NEGATIVE
Protein, ur: NEGATIVE mg/dL
Specific Gravity, Urine: 1.019 (ref 1.005–1.030)
pH: 5 (ref 5.0–8.0)

## 2019-01-24 LAB — TYPE AND SCREEN
ABO/RH(D): O POS
Antibody Screen: NEGATIVE
Unit division: 0
Unit division: 0
Unit division: 0
Unit division: 0
Unit division: 0
Unit division: 0

## 2019-01-24 LAB — RENAL FUNCTION PANEL
Albumin: 2.1 g/dL — ABNORMAL LOW (ref 3.5–5.0)
Anion gap: 7 (ref 5–15)
BUN: 60 mg/dL — ABNORMAL HIGH (ref 8–23)
CO2: 20 mmol/L — ABNORMAL LOW (ref 22–32)
Calcium: 7.7 mg/dL — ABNORMAL LOW (ref 8.9–10.3)
Chloride: 113 mmol/L — ABNORMAL HIGH (ref 98–111)
Creatinine, Ser: 2.16 mg/dL — ABNORMAL HIGH (ref 0.61–1.24)
GFR calc Af Amer: 35 mL/min — ABNORMAL LOW (ref >60)
GFR calc non Af Amer: 31 mL/min — ABNORMAL LOW (ref >60)
Glucose, Bld: 148 mg/dL — ABNORMAL HIGH (ref 70–99)
Phosphorus: 3.1 mg/dL (ref 2.5–4.6)
Potassium: 4.3 mmol/L (ref 3.5–5.1)
Sodium: 140 mmol/L (ref 135–145)

## 2019-01-24 LAB — CBC
HCT: 27.6 % — ABNORMAL LOW (ref 39.0–52.0)
Hemoglobin: 9.1 g/dL — ABNORMAL LOW (ref 13.0–17.0)
MCH: 29.2 pg (ref 26.0–34.0)
MCHC: 33 g/dL (ref 30.0–36.0)
MCV: 88.5 fL (ref 80.0–100.0)
Platelets: 99 10*3/uL — ABNORMAL LOW (ref 150–400)
RBC: 3.12 MIL/uL — ABNORMAL LOW (ref 4.22–5.81)
RDW: 20.1 % — ABNORMAL HIGH (ref 11.5–15.5)
WBC: 8.5 10*3/uL (ref 4.0–10.5)
nRBC: 0 % (ref 0.0–0.2)

## 2019-01-24 MED ORDER — PANTOPRAZOLE SODIUM 40 MG PO TBEC
40.0000 mg | DELAYED_RELEASE_TABLET | Freq: Two times a day (BID) | ORAL | Status: DC
  Administered 2019-01-24 – 2019-01-27 (×6): 40 mg via ORAL
  Filled 2019-01-24 (×6): qty 1

## 2019-01-24 NOTE — Progress Notes (Signed)
Subjective:  Looks great, feels better- crt down to his baseline-- hgb 9.4--9.3--9.9---9.1.  Objective Vital signs in last 24 hours: Vitals:   01/23/19 2347 01/24/19 0500 01/24/19 0800 01/24/19 1213  BP: 139/61 (!) 171/73 (!) 142/74 (!) 144/66  Pulse: 87 89    Resp:  15 15 17   Temp: 99.2 F (37.3 C) 98.1 F (36.7 C) 98.1 F (36.7 C) 98.5 F (36.9 C)  TempSrc: Oral Oral Oral Oral  SpO2: 95% 95% 95% 95%  Weight:      Height:       Weight change:   Intake/Output Summary (Last 24 hours) at 01/24/2019 1241 Last data filed at 01/24/2019 1225 Gross per 24 hour  Intake 325.81 ml  Output 1270 ml  Net -944.19 ml    Assessment/Plan:69 year old white male status post renal transplant with some CKD and creatinine of 2. Recent Covid infection treated now presents with GIB  1.Renal-patient is status post renal transplant in 2015. His baseline creatinine is around 2 at best. Therefore, his kidney function was around his baseline upon admission. True that it appears to be trending worse last couple of days.  Likely hemodynamically mediated with the significant GIB and procedures as well as elevated lactate.  He is making urine.crt back down.  Does not appear to be uremic.  doubt something like rejection.  continue to follow for support 2.Status post renal transplant-continued on his home medications of Prograf 2 mg twice daily and Myfortic 180 mg twice daily as well as prednisone 3.  GIB-  Significant-  Required transfusions and intervention on EGD-  Hopefully stabilizing and kidney function will follow - that seems to be the deal today 4.Covid infection-Per hospitalist. this seems better. Seems to be past his 21 days-  Does he still need to be in isolation ?? 5. Hypertension/volume-blood pressure increasing.  No BP meds and no diuretics, would continue same for now 6.Hyperkalemia-has been an issue for him in the past..... follow      Louis Meckel    Labs: Basic Metabolic Panel: Recent Labs  Lab 01/22/19 0948 01/23/19 0724 01/24/19 0252  NA 138 140 140  K 5.1 4.5 4.3  CL 112* 112* 113*  CO2 18* 20* 20*  GLUCOSE 226* 163* 148*  BUN 77* 79* 60*  CREATININE 2.65* 2.77* 2.16*  CALCIUM 7.3* 7.5* 7.7*  PHOS  --   --  3.1   Liver Function Tests: Recent Labs  Lab 01/20/19 1446 01/21/19 0500 01/24/19 0252  AST 21 16  --   ALT 35 27  --   ALKPHOS 55 41  --   BILITOT 0.7 0.6  --   PROT 5.4* 4.4*  --   ALBUMIN 2.5* 2.1* 2.1*   No results for input(s): LIPASE, AMYLASE in the last 168 hours. No results for input(s): AMMONIA in the last 168 hours. CBC: Recent Labs  Lab 01/21/19 0500 01/21/19 0500 01/21/19 1457 01/21/19 1457 01/21/19 2151 01/21/19 2151 01/22/19 0948 01/22/19 1800 01/23/19 0724  WBC 8.4   < > 10.6*   < > 12.5*  --  14.6*  14.8*  --  9.5  HGB 8.0*   < > 7.8*   < > 6.6*   < > 9.4*  9.3* 9.8* 9.1*  HCT 25.2*   < > 24.6*   < > 20.7*   < > 28.3*  28.0* 29.8* 27.7*  MCV 96.6  --  96.9  --  96.7  --  90.4  90.6  --  87.7  PLT 124*   < > 139*   < > 147*  --  109*  109*  --  96*   < > = values in this interval not displayed.   Cardiac Enzymes: No results for input(s): CKTOTAL, CKMB, CKMBINDEX, TROPONINI in the last 168 hours. CBG: Recent Labs  Lab 01/23/19 1145 01/23/19 1722 01/23/19 2212 01/24/19 0832 01/24/19 1212  GLUCAP 217* 177* 186* 134* 171*    Iron Studies: No results for input(s): IRON, TIBC, TRANSFERRIN, FERRITIN in the last 72 hours. Studies/Results: No results found. Medications: Infusions:   Scheduled Medications: . atorvastatin  40 mg Oral QHS  . gabapentin  300 mg Oral QHS  . insulin aspart  0-9 Units Subcutaneous TID WC  . insulin glargine  12 Units Subcutaneous QHS  . methimazole  2.5 mg Oral Daily  . multivitamin with minerals  1 tablet Oral Daily  . mycophenolate  180 mg Oral BID  . pantoprazole  40 mg Oral BID  . predniSONE  5 mg Oral Q breakfast   . tacrolimus  2 mg Oral BID  . tamsulosin  0.4 mg Oral QHS    have reviewed scheduled and prn medications.  Physical Exam: General: alert, recognizes me Heart: RRR Lungs: mostly clear Abdomen: soft, slightly tender Extremities: no edema    01/24/2019,12:41 PM  LOS: 4 days

## 2019-01-24 NOTE — Progress Notes (Signed)
Daily Rounding Note  01/24/2019, 10:22 AM  LOS: 4 days   SUBJECTIVE:   Chief complaint:   Bleeding duodenal ulcer. On full liquids. Patient has not had a bowel movement for a couple of days but the most recent bowel movement it was more brown in color.  Having a lot of flatus.  He is tolerating his full liquids just fine.  No abdominal or chest pain.  OBJECTIVE:         Vital signs in last 24 hours:    Temp:  [97.7 F (36.5 C)-99.2 F (37.3 C)] 98.1 F (36.7 C) (01/14 0800) Pulse Rate:  [87-89] 89 (01/14 0500) Resp:  [15-16] 15 (01/14 0800) BP: (128-171)/(61-106) 142/74 (01/14 0800) SpO2:  [94 %-96 %] 95 % (01/14 0800) Last BM Date: 01/23/19 Filed Weights   01/21/19 1413 01/22/19 1251  Weight: 100 kg 103.9 kg   General: Looks somewhat chronically unwell but not acutely ill. Heart: RRR. Chest: Clear bilaterally.  No labored breathing or cough. Abdomen: Soft.  Not tender, not distended.  Active bowel sounds.  Large scar consistent with his renal transplant in the right lower quadrant. Extremities: No CCE. Neuro/Psych: Cooperative, pleasant, fluid speech.  No gross weakness, tremors, deficits.  Intake/Output from previous day: 01/13 0701 - 01/14 0700 In: 325.8 [P.O.:50; I.V.:275.8] Out: 1080 [Urine:1080]  Intake/Output this shift: Total I/O In: -  Out: 350 [Urine:350]  Lab Results: Recent Labs    01/21/19 2151 01/21/19 2151 01/22/19 0948 01/22/19 1800 01/23/19 0724  WBC 12.5*  --  14.6*  14.8*  --  9.5  HGB 6.6*   < > 9.4*  9.3* 9.8* 9.1*  HCT 20.7*   < > 28.3*  28.0* 29.8* 27.7*  PLT 147*  --  109*  109*  --  96*   < > = values in this interval not displayed.   BMET Recent Labs    01/22/19 0948 01/23/19 0724 01/24/19 0252  NA 138 140 140  K 5.1 4.5 4.3  CL 112* 112* 113*  CO2 18* 20* 20*  GLUCOSE 226* 163* 148*  BUN 77* 79* 60*  CREATININE 2.65* 2.77* 2.16*  CALCIUM 7.3* 7.5* 7.7*    LFT Recent Labs    01/24/19 0252  ALBUMIN 2.1*   PT/INR Recent Labs    01/21/19 1457  LABPROT 14.3  INR 1.1   Hepatitis Panel No results for input(s): HEPBSAG, HCVAB, HEPAIGM, HEPBIGM in the last 72 hours.  Studies/Results: No results found.   Scheduled Meds: . atorvastatin  40 mg Oral QHS  . gabapentin  300 mg Oral QHS  . insulin aspart  0-9 Units Subcutaneous TID WC  . insulin glargine  12 Units Subcutaneous QHS  . methimazole  2.5 mg Oral Daily  . multivitamin with minerals  1 tablet Oral Daily  . mycophenolate  180 mg Oral BID  . pantoprazole (PROTONIX) IV  40 mg Intravenous Q12H  . predniSONE  5 mg Oral Q breakfast  . tacrolimus  2 mg Oral BID  . tamsulosin  0.4 mg Oral QHS   Continuous Infusions: PRN Meds:.acetaminophen **OR** acetaminophen, HYDROcodone-acetaminophen, ondansetron **OR** ondansetron (ZOFRAN) IV   ASSESMENT:   *  Upper GI bleed.  Black stools, dizziness. On daily PPI at home for H pylori negative gastritis on EGD 09/2017.  01/22/19 EGD: small HH, gastritis, 3 DUs one w VV was treated w epi and 2 clips.   PPI drip completed 1/13 at 1600 >> Protonix  40 IV bid.     Stool H Pylori AG ordered but not yet collected.  *   Blood loss and anemia of chronic kidney dz in transplanted kidney.   Hgb 6.6 >> 5 PRBCs >> 9.8 >> 9.1 yesterday.      *   Thrombocytopenia.  INR normal.    *   Hepatic steatosis per CTAP w/o contrast in 06/2017. No stigmata of liver dz on EGD yesterday.    *    AKI improved.  Previous transplant.  Mycophenolate, low-dose prednisone, tacrolimus  *   Covid 19 PNA.  Hospitalized 12/17 -12/21.   PLAN   *    CBC now and next 2 days.   Advance to carb mod diet Switch to twice daily oral Protonix. Hopefully he can produce a stool specimen for H. pylori testing.    Azucena Freed  01/24/2019, 10:22 AM Phone [phone]

## 2019-01-24 NOTE — Progress Notes (Signed)
Patient is for transfer out to [facility], report given to RN - Wife informed about the transfer - Transported pt via bed, alert & Ox4; - on room air not in distress

## 2019-01-24 NOTE — Progress Notes (Signed)
PROGRESS NOTE    Jon [name]  DXA:[id number] DOB: [birth year] DOA: 01/20/2019 PCP: [name], MD    Brief Narrative:  69 year old gentleman prior history of stage IV CKD s/p renal planned transplant on chronic Imuran no suppression, hypertension, type 2 diabetes, CVA with no residual deficits discharged from the hospital recently after being treated for Covid pneumonia presents to ED with generalized weakness and hypotension. Patient was originally diagnosed with COVID-19 on 12/27/2018 discharged on 12/31/2018 Readmitted on 01/04/2019 for worsening hypoxia discharged home on steroid taper. Readmitted for generalized weakness hypotension and GI bleed. GI consulted, underwent EGD showing 3 duodenal ulcer with active bleeding resolved which was treated with epi and 2 clips. Patient also received 3 units of PRBC transfusion. Hospital course complicated by mild worsening of the renal function for which nephrology consulted and recommended no further work-up at this time. Transfer the patient out of airborne/isolation today.    Assessment & Plan:   Principal Problem:   Upper GI bleed Active Problems:   History of kidney transplant   History of stroke   OSA (obstructive sleep apnea)   Pulmonary hypertension, unspecified (HCC)   Type 2 diabetes mellitus without complications ([place])   MCNOB-09 virus infection   Cerebrovascular accident (CVA) ([place])   GI bleed   Duodenal ulcer hemorrhagic    Upper GI bleed secondary to bleeding duodenal ulcers. GI consulted, s/p EGD on 01/21/2018 showing 3 duodenal ulcers with one active bleeding with visible vessels treated with epi and 2 clips. Continue with PPI twice daily Transfuse to keep hemoglobin greater than 7.  So far patient has received about 5 units of PRBCs.    Acute blood loss anemia from bleeding duodenal ulcers S/p 5 units of PRBC transfusions and hemoglobin around 9 yesterday. Transfuse to keep hemoglobin greater than  7.    Mild AKI on stage IV CKD/s/p renal transplant in 2015 Baseline creatinine between 2-2.5. Slight worsening of the renal parameters probably secondary to GI bleed and prerenal causes. Repeat creatinine is 2.16 today bicarb is 20, BUN 60.  Urine analysis is negative and urine output is adequate Appreciate nephrology recommendations.   Recent Covid 19 pneumonia Completed IV  remdesivir and Decadron. DC contact/airborne precaution   Type 2 diabetes mellitus Last hemoglobin A1c at 7.8 Continue with Lantus  CBG (last 3)  Recent Labs    01/24/19 0832 01/24/19 1212 01/24/19 1652  GLUCAP 134* 171* 216*   Resume sliding scale insulin.    History of CVA, no residual deficits Holding aspirin for acute GI bleed.    Hyperthyroidism Continue with methimazole   DVT prophylaxis: SCDs Code Status: Full code Family Communication: None at bedside Disposition Plan: Pending PT evaluation, clearance from gastroenterology   Consultants:   Nephrology gastroenterology  Procedures: EGD Antimicrobials: None  Subjective: No new complaints, no nausea vomiting, abdominal pain, chest pain, shortness of breath, headache, dizziness.  Objective: Vitals:   01/24/19 0500 01/24/19 0800 01/24/19 1213 01/24/19 1410  BP: (!) 171/73 (!) 142/74 (!) 144/66 (!) 147/65  Pulse: 89   (!) 101  Resp: 15 15 17 18   Temp: 98.1 F (36.7 C) 98.1 F (36.7 C) 98.5 F (36.9 C) 100 F (37.8 C)  TempSrc: Oral Oral Oral Oral  SpO2: 95% 95% 95% 97%  Weight:      Height:        Intake/Output Summary (Last 24 hours) at 01/24/2019 1631 Last data filed at 01/24/2019 1225 Gross per 24 hour  Intake 325.81 ml  Output 1270 ml  Net -944.19 ml   Filed Weights   01/21/19 1413 01/22/19 1251  Weight: 100 kg 103.9 kg    Examination:  General exam: Appears calm and comfortable  Respiratory system: Clear to auscultation. Respiratory effort normal. Cardiovascular system: S1 & S2 heard, RRR. No JVD No  pedal edema. Gastrointestinal system: Abdomen is nondistended, soft and nontender.Normal bowel sounds heard. Central nervous system: Alert and oriented. No focal neurological deficits. Extremities: Symmetric 5 x 5 power. Skin: No rashes, lesions or ulcers Psychiatry: Judgement and insight appear normal. Mood & affect appropriate.     Data Reviewed: I have personally reviewed following labs and imaging studies  CBC: Recent Labs  Lab 01/21/19 1457 01/21/19 1457 01/21/19 2151 01/22/19 0948 01/22/19 1800 01/23/19 0724 01/24/19 1526  WBC 10.6*  --  12.5* 14.6*  14.8*  --  9.5 8.5  HGB 7.8*   < > 6.6* 9.4*  9.3* 9.8* 9.1* 9.1*  HCT 24.6*   < > 20.7* 28.3*  28.0* 29.8* 27.7* 27.6*  MCV 96.9  --  96.7 90.4  90.6  --  87.7 88.5  PLT 139*  --  147* 109*  109*  --  96* 99*   < > = values in this interval not displayed.   Basic Metabolic Panel: Recent Labs  Lab 01/21/19 0500 01/21/19 2151 01/22/19 0948 01/23/19 0724 01/24/19 0252  NA 140 139 138 140 140  K 4.7 4.7 5.1 4.5 4.3  CL 110 111 112* 112* 113*  CO2 21* 19* 18* 20* 20*  GLUCOSE 158* 235* 226* 163* 148*  BUN 55* 63* 77* 79* 60*  CREATININE 1.93* 2.10* 2.65* 2.77* 2.16*  CALCIUM 7.9* 7.6* 7.3* 7.5* 7.7*  PHOS  --   --   --   --  3.1   GFR: Estimated Creatinine Clearance: 40.7 mL/min (A) (by C-G formula based on SCr of 2.16 mg/dL (H)). Liver Function Tests: Recent Labs  Lab 01/20/19 1446 01/21/19 0500 01/24/19 0252  AST 21 16  --   ALT 35 27  --   ALKPHOS 55 41  --   BILITOT 0.7 0.6  --   PROT 5.4* 4.4*  --   ALBUMIN 2.5* 2.1* 2.1*   No results for input(s): LIPASE, AMYLASE in the last 168 hours. No results for input(s): AMMONIA in the last 168 hours. Coagulation Profile: Recent Labs  Lab 01/21/19 1457  INR 1.1   Cardiac Enzymes: No results for input(s): CKTOTAL, CKMB, CKMBINDEX, TROPONINI in the last 168 hours. BNP (last 3 results) No results for input(s): PROBNP in the last 8760  hours. HbA1C: No results for input(s): HGBA1C in the last 72 hours. CBG: Recent Labs  Lab 01/23/19 1145 01/23/19 1722 01/23/19 2212 01/24/19 0832 01/24/19 1212  GLUCAP 217* 177* 186* 134* 171*   Lipid Profile: No results for input(s): CHOL, HDL, LDLCALC, TRIG, CHOLHDL, LDLDIRECT in the last 72 hours. Thyroid Function Tests: No results for input(s): TSH, T4TOTAL, FREET4, T3FREE, THYROIDAB in the last 72 hours. Anemia Panel: No results for input(s): VITAMINB12, FOLATE, FERRITIN, TIBC, IRON, RETICCTPCT in the last 72 hours. Sepsis Labs: Recent Labs  Lab 01/20/19 1446 01/21/19 2151  LATICACIDVEN 1.7 2.7*    Recent Results (from the past 240 hour(s))  Culture, blood (routine x 2)     Status: None (Preliminary result)   Collection Time: 01/21/19  9:32 PM   Specimen: BLOOD RIGHT HAND  Result Value Ref Range Status   Specimen Description BLOOD RIGHT HAND  Final  Special Requests   Final    BOTTLES DRAWN AEROBIC AND ANAEROBIC Blood Culture results may not be optimal due to an inadequate volume of blood received in culture bottles   Culture   Final    NO GROWTH 3 DAYS Performed at [facility], [place] [address], [place], [place] [address]    Report Status PENDING  Incomplete  Culture, blood (routine x 2)     Status: None (Preliminary result)   Collection Time: 01/21/19  9:43 PM   Specimen: BLOOD  Result Value Ref Range Status   Specimen Description BLOOD RIGHT THUMB  Final   Special Requests   Final    BOTTLES DRAWN AEROBIC AND ANAEROBIC Blood Culture results may not be optimal due to an inadequate volume of blood received in culture bottles   Culture   Final    NO GROWTH 3 DAYS Performed at [facility], [place] [address] [place], [place] [address]    Report Status PENDING  Incomplete         Radiology Studies: No results found.      Scheduled Meds: . atorvastatin  40 mg Oral QHS  . gabapentin  300 mg Oral QHS  . insulin aspart  0-9 Units  Subcutaneous TID WC  . insulin glargine  12 Units Subcutaneous QHS  . methimazole  2.5 mg Oral Daily  . multivitamin with minerals  1 tablet Oral Daily  . mycophenolate  180 mg Oral BID  . pantoprazole  40 mg Oral BID  . predniSONE  5 mg Oral Q breakfast  . tacrolimus  2 mg Oral BID  . tamsulosin  0.4 mg Oral QHS   Continuous Infusions:   LOS: 4 days        Hosie Poisson, MD [facility] 01/24/2019, 4:31 PM

## 2019-01-24 NOTE — Evaluation (Signed)
Physical Therapy Evaluation Patient Details Name: Jon [name] MRN: [id number] DOB: 1950/07/17 Today's Date: 01/24/2019   History of Present Illness  Jon [name] is a 69 y.o. male with medical history significant of stage IV chronic kidney disease, status post renal transplant on chronic immunosuppression, type 2 diabetes mellitus, hypertension, history of CVA, hospitalized x 2 for Covid, then Covid pna12/21-22; 12/25-1/1.  Admitted for weakness, melena and GIB now s/p units 5 PRBC's  Clinical Impression  Patient presents with decreased mobility due to weakness, imbalance, and decreased activity tolerance with HR up to 132 and dyspnea 3/4 with ambulation, SpO2 96% on RA.  Feel he will benefit from skilled PT in the acute setting to allow return home with family support and to resume HHPT.     Follow Up Recommendations Home health PT;Supervision/Assistance - 24 hour(resume HHPT)    Equipment Recommendations  None recommended by PT    Recommendations for Other Services       Precautions / Restrictions Precautions Precautions: Fall Restrictions Weight Bearing Restrictions: No      Mobility  Bed Mobility Overal bed mobility: Modified Independent Bed Mobility: Supine to Sit           General bed mobility comments: no physical assist needed  Transfers Overall transfer level: Needs assistance Equipment used: Rolling walker (2 wheeled) Transfers: Sit to/from Stand Sit to Stand: From elevated surface;Min assist         General transfer comment: for balance up to RW  Ambulation/Gait Ambulation/Gait assistance: Min assist Gait Distance (Feet): 40 Feet Assistive device: Rolling walker (2 wheeled) Gait Pattern/deviations: Step-to pattern;Step-through pattern     General Gait Details: LE weakness evident, reports history of polio as a child  Science writer    Modified Rankin (Stroke Patients Only)       Balance Overall balance  assessment: Needs assistance Sitting-balance support: Feet supported;No upper extremity supported Sitting balance-Leahy Scale: Fair     Standing balance support: Bilateral upper extremity supported Standing balance-Leahy Scale: Poor Standing balance comment: requires UE assist for balance on RW and therapist min A with more narrow BOS (pt self-selects wide BOS)                             Pertinent Vitals/Pain Pain Assessment: 0-10 Pain Score: 4  Pain Location: lower back Pain Descriptors / Indicators: Aching Pain Intervention(s): Monitored during session;Repositioned    Home Living Family/patient expects to be discharged to:: Private residence Living Arrangements: Spouse/significant other Available Help at Discharge: Family;Available 24 hours/day Type of Home: House Home Access: Ramped entrance     Home Layout: Two level;Able to live on main level with bedroom/bathroom Home Equipment: Shower seat - built in;Grab bars - tub/shower;Hand held Tourist information centre manager - 2 wheels;Walker - 4 wheels      Prior Function     Gait / Transfers Assistance Needed: always used RW  ADL's / Homemaking Assistance Needed: wife helped with showering        Hand Dominance        Extremity/Trunk Assessment   Upper Extremity Assessment Upper Extremity Assessment: Generalized weakness    Lower Extremity Assessment Lower Extremity Assessment: Generalized weakness    Cervical / Trunk Assessment Cervical / Trunk Assessment: Kyphotic  Communication   Communication: No difficulties  Cognition Arousal/Alertness: Awake/alert Behavior During Therapy: WFL for tasks assessed/performed Overall Cognitive Status: Within Functional Limits  for tasks assessed                                        General Comments General comments (skin integrity, edema, etc.): HR up to 132 with ambulation, SpO2 96% dyspnea 3/4 on exertion    Exercises     Assessment/Plan    PT  Assessment Patient needs continued PT services  PT Problem List Decreased strength;Decreased range of motion;Decreased activity tolerance;Decreased balance;Decreased mobility;Decreased cognition;Decreased safety awareness       PT Treatment Interventions DME instruction;Gait training;Functional mobility training;Therapeutic activities;Therapeutic exercise;Balance training;Cognitive remediation    PT Goals (Current goals can be found in the Care Plan section)  Acute Rehab PT Goals Patient Stated Goal: go home PT Goal Formulation: With patient Time For Goal Achievement: 02/07/19    Frequency Min 3X/week   Barriers to discharge        Co-evaluation               AM-PAC PT "6 Clicks" Mobility  Outcome Measure Help needed turning from your back to your side while in a flat bed without using bedrails?: None Help needed moving from lying on your back to sitting on the side of a flat bed without using bedrails?: A Little Help needed moving to and from a bed to a chair (including a wheelchair)?: A Little Help needed standing up from a chair using your arms (e.g., wheelchair or bedside chair)?: A Little Help needed to walk in hospital room?: A Little Help needed climbing 3-5 steps with a railing? : A Lot 6 Click Score: 18    End of Session Equipment Utilized During Treatment: Gait belt Activity Tolerance: Patient limited by fatigue Patient left: in bed;with call bell/phone within reach Nurse Communication: Mobility status PT Visit Diagnosis: Unsteadiness on feet (R26.81);Other abnormalities of gait and mobility (R26.89);Muscle weakness (generalized) (M62.81);Difficulty in walking, not elsewhere classified (R26.2)    Time: 1430-1500 PT Time Calculation (min) (ACUTE ONLY): 30 min   Charges:   PT Evaluation $PT Eval Low Complexity: 1 Low PT Treatments $Gait Training: 8-22 mins        Magda Kiel, [place] [facility] [phone] 01/24/2019   Reginia Naas 01/24/2019, 4:17 PM

## 2019-01-25 ENCOUNTER — Other Ambulatory Visit: Payer: Self-pay

## 2019-01-25 LAB — GLUCOSE, CAPILLARY
Glucose-Capillary: 145 mg/dL — ABNORMAL HIGH (ref 70–99)
Glucose-Capillary: 221 mg/dL — ABNORMAL HIGH (ref 70–99)
Glucose-Capillary: 234 mg/dL — ABNORMAL HIGH (ref 70–99)
Glucose-Capillary: 206 mg/dL — ABNORMAL HIGH (ref 70–99)

## 2019-01-25 LAB — BASIC METABOLIC PANEL
Anion gap: 8 (ref 5–15)
BUN: 38 mg/dL — ABNORMAL HIGH (ref 8–23)
CO2: 19 mmol/L — ABNORMAL LOW (ref 22–32)
Calcium: 8 mg/dL — ABNORMAL LOW (ref 8.9–10.3)
Chloride: 113 mmol/L — ABNORMAL HIGH (ref 98–111)
Creatinine, Ser: 1.95 mg/dL — ABNORMAL HIGH (ref 0.61–1.24)
GFR calc Af Amer: 40 mL/min — ABNORMAL LOW (ref >60)
GFR calc non Af Amer: 35 mL/min — ABNORMAL LOW (ref >60)
Glucose, Bld: 234 mg/dL — ABNORMAL HIGH (ref 70–99)
Potassium: 4.6 mmol/L (ref 3.5–5.1)
Sodium: 140 mmol/L (ref 135–145)

## 2019-01-25 LAB — CBC
HCT: 25.1 % — ABNORMAL LOW (ref 39.0–52.0)
Hemoglobin: 8.2 g/dL — ABNORMAL LOW (ref 13.0–17.0)
MCH: 29.1 pg (ref 26.0–34.0)
MCHC: 32.7 g/dL (ref 30.0–36.0)
MCV: 89 fL (ref 80.0–100.0)
Platelets: 97 10*3/uL — ABNORMAL LOW (ref 150–400)
RBC: 2.82 MIL/uL — ABNORMAL LOW (ref 4.22–5.81)
RDW: 19.8 % — ABNORMAL HIGH (ref 11.5–15.5)
WBC: 6.6 10*3/uL (ref 4.0–10.5)
nRBC: 0 % (ref 0.0–0.2)

## 2019-01-25 NOTE — TOC Initial Note (Signed)
Transition of Care [facility]) - Initial/Assessment Note    Patient Details  Name: Jon [name] MRN: [id number] Date of Birth: May 22, 1950  Transition of Care [facility]) CM/SW Contact:    Alexander Mt, [place] Phone Number:  01/25/2019, 11:39 AM  Clinical Narrative:                 CSW called and spoke with pt who gave permission for CSW to call and speak with pt wife Herbert Pun. CSW spoke with pt wife Herbert Pun via telephone. Pt from home with [facility] services. Pt has been active with them and would like to stay with them if possible. Pt has all needed DME and no further concerns at this time. Pt wife eager for pt to go home when ready, provided encouragement and support given multiple hospitalizations.   CSW called and f/u with [facility] who is able to accept pt back, requested renewed [place] orders and face to face.  Expected Discharge Plan: [place] Barriers to Discharge: Continued Medical Work up   Patient Goals and CMS Choice Patient states their goals for this hospitalization and ongoing recovery are:: for him to return home with previous services CMS Medicare.gov Compare Post Acute Care list provided to:: (n/a pt family prefers to remain with [facility]) Choice offered to / list presented to : Patient, Spouse  Expected Discharge Plan and Services Expected Discharge Plan: [place] In-house Referral: Clinical Social Work Discharge Planning Services: CM Consult Post Acute Care Choice: Durable Medical Equipment, Home Health, Resumption of Svcs/PTA Provider Living arrangements for the past 2 months: [place] Arranged: PT, OT HH Agency: [place] Date [facility] Agency Contacted: 01/25/19 Time HH Agency Contacted: [id number] Representative spoke with at [place]: Adela Lank  Prior Living Arrangements/Services Living arrangements for the past 2 months: [place] Lives with:: Spouse, Pets Patient language and  need for interpreter reviewed:: Yes(no needs) Do you feel safe going back to the place where you live?: Yes      Need for Family Participation in Patient Care: Yes (Comment)(supervision/assistance as needed) Care giver support system in place?: Yes (comment)(pt spouse; adult children; [place] agency) Current home services: DME, Home OT, Home PT, Homehealth aide Criminal Activity/Legal Involvement Pertinent to Current Situation/Hospitalization: No - Comment as needed  Activities of Daily Living Home Assistive Devices/Equipment: CPAP, Bedside commode/3-in-1, Blood pressure cuff, Eyeglasses, Dentures (specify type), Scales, Walker (specify type), Shower chair with back, Grab bars in shower, Grab bars around toilet, CBG Meter ADL Screening (condition at time of admission) Patient's cognitive ability adequate to safely complete daily activities?: Yes Is the patient deaf or have difficulty hearing?: No Does the patient have difficulty seeing, even when wearing glasses/contacts?: No Does the patient have difficulty concentrating, remembering, or making decisions?: No Patient able to express need for assistance with ADLs?: Yes Does the patient have difficulty dressing or bathing?: No Independently performs ADLs?: Yes (appropriate for developmental age) Does the patient have difficulty walking or climbing stairs?: No Weakness of Legs: Both Weakness of Arms/Hands: None  Permission Sought/Granted Permission sought to share information with : Family Supports Permission granted to share information with : Yes, Verbal Permission Granted  Share Information with NAME: Sheron Robin  Permission granted to share info w AGENCY: [place] granted to share info w  Relationship: wife  Permission granted to share info w Contact Information: [phone]  Emotional Assessment Appearance:: Other (Comment Required(telephonic assessment) Attitude/Demeanor/Rapport: (telephonic assessment) Affect (typically  observed): (telephonic assessment) Orientation: : Oriented to Self, Oriented to Place, Oriented to  Time, Oriented to Situation Alcohol / Substance Use: Not Applicable Psych Involvement: No (comment)  Admission diagnosis:  Dehydration [E86.0] Weakness [R53.1] GI bleed [K92.2] Upper GI bleed [K92.2] Patient Active Problem List   Diagnosis Date Noted   Duodenal ulcer hemorrhagic    Melena    Personal history of covid-19    Upper GI bleed 01/20/2019   GI bleed 01/20/2019   Elevated LFTs    Pneumonia due to COVID-19 virus    Cerebrovascular accident (CVA) ([place])    Acute kidney injury superimposed on chronic kidney disease ([place]) 01/04/2019   Acute respiratory failure with hypoxia ([place]) 01/04/2019   AKI (acute kidney injury) ([place]) 12/27/2018   COVID-19 virus infection    Ascending aortic aneurysm ([place]) 10/23/2017   Arteriovenous fistula for hemodialysis in place, primary ([place]) 08/08/2017   Cerebral infarction ([place]) 08/08/2017   Osteoarthritis (arthritis due to wear and tear of joints) 08/08/2017   Acute renal failure superimposed on stage 4 chronic kidney disease ([place]) 06/04/2017   GERD (gastroesophageal reflux disease) 06/04/2017   Pulmonary hypertension, unspecified ([place]) 01/23/2017   Peripheral edema 01/02/2017   Acute right ankle pain 12/08/2016   Fungal intestinal infection following organ transplantation 11/29/2016   Finger infection, fungal 11/22/2016   Mass of soft tissue of left upper extremity 10/18/2016   Scapholunate dissociation of left wrist 10/18/2016   Digital mucinous cyst of finger of left hand 10/10/2016   Dyspnea on exertion 08/24/2016   Swelling of lower extremity 06/30/2016   Hypertension associated with transplantation 12/03/2014   URI, acute 03/07/2014   BK viremia 02/07/2014   Type 2 diabetes mellitus without complications ([place]) 23/30/0762   Hypomagnesemia 10/25/2013   Elevated serum creatinine 10/24/2013   Immunosuppressive management encounter  following kidney transplant 10/21/2013   Immunosuppression ([place]) 10/14/2013   Pain at surgical site 10/01/2013   HLD (hyperlipidemia) 05/02/2013   OSA (obstructive sleep apnea) 05/02/2013   History of stroke 03/16/2013   Movement disorder 03/16/2013   Lumbar stenosis with neurogenic claudication 12/27/2012   Chronic back pain 01/27/2011   HTN (hypertension) 01/27/2011   History of kidney transplant 01/24/2011   Hyperkalemia 01/23/2011   Diabetes mellitus ([place]) 01/23/2011   PCP:  [name], MD Pharmacy:   [facility] [address], [place] [address] Phone: [phone] Fax: [phone]     Readmission Risk Interventions Readmission Risk Prevention Plan 01/25/2019 12/31/2018  Transportation Screening Complete Complete  PCP or Specialist Appt within 3-5 Days - Not Complete  Palliative Care Screening - Not Applicable  Medication Review (RN Care Manager) Complete Complete  PCP or Specialist appointment within 3-5 days of discharge Not Complete -  PCP/Specialist Appt Not Complete comments pt wife prefers to make one herself -  [place] or Home Care Consult Complete -  SW Recovery Care/Counseling Consult Complete -  Palliative Care Screening Not Applicable -  [place] Not Applicable -  Some recent data might be hidden

## 2019-01-25 NOTE — Progress Notes (Signed)
PROGRESS NOTE    Jon [name]  QBH:[id number] DOB: Feb 09, 1950 DOA: 01/20/2019 PCP: [name], MD    Brief Narrative:  69 year old gentleman prior history of stage IV CKD s/p renal planned transplant on chronic Imuran no suppression, hypertension, type 2 diabetes, CVA with no residual deficits discharged from the hospital recently after being treated for Covid pneumonia presents to ED with generalized weakness and hypotension. Patient was originally diagnosed with COVID-19 on 12/27/2018 discharged on 12/31/2018 Readmitted on 01/04/2019 for worsening hypoxia discharged home on steroid taper. Readmitted for generalized weakness hypotension and GI bleed. GI consulted, underwent EGD showing 3 duodenal ulcer with active bleeding resolved which was treated with epi and 2 clips. Patient also received 3 units of PRBC transfusion. Hospital course complicated by mild worsening of the renal function for which nephrology consulted and recommended no further work-up at this time. Transfered the patient out of airborne/isolation on 01/24/19. His creatinine has improved but his hemoglobin dropped from 9.1 to 8.2 today, with one melanotic stool. Will keep the patient overnight to watch for more bleeding and he is at risk of further deterioration from bleeding.      Assessment & Plan:   Principal Problem:   Upper GI bleed Active Problems:   History of kidney transplant   History of stroke   OSA (obstructive sleep apnea)   Pulmonary hypertension, unspecified (HCC)   Type 2 diabetes mellitus without complications ([place])   [id number] virus infection   Cerebrovascular accident (CVA) ([place])   GI bleed   Duodenal ulcer hemorrhagic    Upper GI bleed secondary to bleeding duodenal ulcers. GI consulted, s/p EGD on 01/21/2018 showing 3 duodenal ulcers with one active bleeding with visible vessels treated with epi and 2 clips. Continue with PPI twice daily for atleast 8 week and then daily  thereafter.  Transfuse to keep hemoglobin greater than 7.  So far patient has received about 5 units of PRBCs. Stool sent for H pylori pcr.     Acute blood loss anemia from bleeding duodenal ulcers S/p 5 units of PRBC transfusions and hemoglobin dropped to 9.1 to 8.2 today. One melanotic stool this am. Watch for further bleeding episodes and melanotic stools.  Transfuse to keep hemoglobin greater than 7.    Mild AKI on stage IV CKD/s/p renal transplant in 2015 Baseline creatinine between 2-2.5. Slight worsening of the renal parameters earlier in the admission probably secondary to GI bleed and prerenal causes. Repeat creatinine at 1.95 and stable.  Urine analysis is negative and urine output is adequate Appreciate nephrology recommendations.   Recent Covid 19 pneumonia Completed IV  remdesivir and Decadron. DC contact/airborne precaution No chest pain or sob. No new complaints.    Type 2 diabetes mellitus Last hemoglobin A1c at 7.8 Continue with Lantus  CBG (last 3)  Recent Labs    01/25/19 0745 01/25/19 1144 01/25/19 1636  GLUCAP 145* 221* 234*   Resume sliding scale insulin.add 2 units of novolog TIDAC.     History of CVA, no residual deficits Holding aspirin for acute GI bleed.    Hyperthyroidism Continue with methimazole   DVT prophylaxis: SCDs Code Status: Full code Family Communication: family at bedside.  Disposition Plan: PENDING STABLE HEMOGLOBIN.    Consultants:   Nephrology gastroenterology  Procedures: EGD Antimicrobials: None  Subjective: No chest pain or sob, no nausea or vomiting or abdominal pain.  One melanotic stool this am.   Objective: Vitals:   01/24/19 2057 01/25/19 0448 01/25/19 1005 01/25/19  1322  BP: 138/77 120/65 108/60 (!) 155/70  Pulse: 82 85 98 91  Resp: 20 20 19 19   Temp: 99.6 F (37.6 C) 99.1 F (37.3 C) 98.6 F (37 C) 98.7 F (37.1 C)  TempSrc: Oral Oral Oral Oral  SpO2: 97% 95% 96% 97%  Weight:       Height:        Intake/Output Summary (Last 24 hours) at 01/25/2019 1702 Last data filed at 01/25/2019 0900 Gross per 24 hour  Intake 520 ml  Output 625 ml  Net -105 ml   Filed Weights   01/21/19 1413 01/22/19 1251  Weight: 100 kg 103.9 kg    Examination:  General exam: alert and comfortable.  Respiratory system: clear to auscultation, no wheezing or rhonchi.  Cardiovascular system: S1-S2 heard, regular rate rhythm, no JVD, no pedal edema Gastrointestinal system: Abdomen is soft, nontender, nondistended bowel sounds normal Central nervous system: Alert and oriented, no focal deficits. Extremities: No pedal edema Skin: No rashes Psychiatry: Mood appropriate    Data Reviewed: I have personally reviewed following labs and imaging studies  CBC: Recent Labs  Lab 01/21/19 2151 01/21/19 2151 01/22/19 0948 01/22/19 1800 01/23/19 0724 01/24/19 1526 01/25/19 0342  WBC 12.5*  --  14.6*  14.8*  --  9.5 8.5 6.6  HGB 6.6*   < > 9.4*  9.3* 9.8* 9.1* 9.1* 8.2*  HCT 20.7*   < > 28.3*  28.0* 29.8* 27.7* 27.6* 25.1*  MCV 96.7  --  90.4  90.6  --  87.7 88.5 89.0  PLT 147*  --  109*  109*  --  96* 99* 97*   < > = values in this interval not displayed.   Basic Metabolic Panel: Recent Labs  Lab 01/21/19 2151 01/22/19 0948 01/23/19 0724 01/24/19 0252 01/25/19 1306  NA 139 138 140 140 140  K 4.7 5.1 4.5 4.3 4.6  CL 111 112* 112* 113* 113*  CO2 19* 18* 20* 20* 19*  GLUCOSE 235* 226* 163* 148* 234*  BUN 63* 77* 79* 60* 38*  CREATININE 2.10* 2.65* 2.77* 2.16* 1.95*  CALCIUM 7.6* 7.3* 7.5* 7.7* 8.0*  PHOS  --   --   --  3.1  --    GFR: Estimated Creatinine Clearance: 45.1 mL/min (A) (by C-G formula based on SCr of 1.95 mg/dL (H)). Liver Function Tests: Recent Labs  Lab 01/20/19 1446 01/21/19 0500 01/24/19 0252  AST 21 16  --   ALT 35 27  --   ALKPHOS 55 41  --   BILITOT 0.7 0.6  --   PROT 5.4* 4.4*  --   ALBUMIN 2.5* 2.1* 2.1*   No results for input(s): LIPASE,  AMYLASE in the last 168 hours. No results for input(s): AMMONIA in the last 168 hours. Coagulation Profile: Recent Labs  Lab 01/21/19 1457  INR 1.1   Cardiac Enzymes: No results for input(s): CKTOTAL, CKMB, CKMBINDEX, TROPONINI in the last 168 hours. BNP (last 3 results) No results for input(s): PROBNP in the last 8760 hours. HbA1C: No results for input(s): HGBA1C in the last 72 hours. CBG: Recent Labs  Lab 01/24/19 1652 01/24/19 2231 01/25/19 0745 01/25/19 1144 01/25/19 1636  GLUCAP 216* 174* 145* 221* 234*   Lipid Profile: No results for input(s): CHOL, HDL, LDLCALC, TRIG, CHOLHDL, LDLDIRECT in the last 72 hours. Thyroid Function Tests: No results for input(s): TSH, T4TOTAL, FREET4, T3FREE, THYROIDAB in the last 72 hours. Anemia Panel: No results for input(s): VITAMINB12, FOLATE, FERRITIN, TIBC, IRON,  RETICCTPCT in the last 72 hours. Sepsis Labs: Recent Labs  Lab 01/20/19 1446 01/21/19 2151  LATICACIDVEN 1.7 2.7*    Recent Results (from the past 240 hour(s))  Culture, blood (routine x 2)     Status: None (Preliminary result)   Collection Time: 01/21/19  9:32 PM   Specimen: BLOOD RIGHT HAND  Result Value Ref Range Status   Specimen Description BLOOD RIGHT HAND  Final   Special Requests   Final    BOTTLES DRAWN AEROBIC AND ANAEROBIC Blood Culture results may not be optimal due to an inadequate volume of blood received in culture bottles   Culture   Final    NO GROWTH 3 DAYS Performed at [facility], [place] [address] [place], [place] [address]    Report Status PENDING  Incomplete  Culture, blood (routine x 2)     Status: None (Preliminary result)   Collection Time: 01/21/19  9:43 PM   Specimen: BLOOD  Result Value Ref Range Status   Specimen Description BLOOD RIGHT THUMB  Final   Special Requests   Final    BOTTLES DRAWN AEROBIC AND ANAEROBIC Blood Culture results may not be optimal due to an inadequate volume of blood received in culture bottles    Culture   Final    NO GROWTH 3 DAYS Performed at [facility], [place] [address] [place], [place] [address]    Report Status PENDING  Incomplete         Radiology Studies: No results found.      Scheduled Meds: . atorvastatin  40 mg Oral QHS  . gabapentin  300 mg Oral QHS  . insulin aspart  0-9 Units Subcutaneous TID WC  . insulin glargine  12 Units Subcutaneous QHS  . methimazole  2.5 mg Oral Daily  . multivitamin with minerals  1 tablet Oral Daily  . mycophenolate  180 mg Oral BID  . pantoprazole  40 mg Oral BID  . predniSONE  5 mg Oral Q breakfast  . tacrolimus  2 mg Oral BID  . tamsulosin  0.4 mg Oral QHS   Continuous Infusions:   LOS: 5 days        Hosie Poisson, MD [facility] 01/25/2019, 5:02 PM

## 2019-01-25 NOTE — Progress Notes (Signed)
Subjective:  Looks great, feels better- crt down to his baseline yest -- hgb 9.1---9.1----8.2. BMs still have a little blood in them - he does not want to leave and need to come right back   Objective Vital signs in last 24 hours: Vitals:   01/24/19 1410 01/24/19 2057 01/25/19 0448 01/25/19 1005  BP: (!) 147/65 138/77 120/65 108/60  Pulse: (!) 101 82 85 98  Resp: 18 20 20 19   Temp: 100 F (37.8 C) 99.6 F (37.6 C) 99.1 F (37.3 C) 98.6 F (37 C)  TempSrc: Oral Oral Oral Oral  SpO2: 97% 97% 95% 96%  Weight:      Height:       Weight change:   Intake/Output Summary (Last 24 hours) at 01/25/2019 1230 Last data filed at 01/25/2019 0900 Gross per 24 hour  Intake 520 ml  Output 625 ml  Net -105 ml    Assessment/Plan:69 year old white male status post renal transplant with some CKD and creatinine of 2. Recent Covid infection treated now presents with GIB  1.Renal-patient is status post renal transplant in 2015. His baseline creatinine is around 2 at best. Therefore, his kidney function was around his baseline upon admission.  trending worse 1/12 and 1/13 but back to baseline 1/14.  Likely hemodynamically mediated with the significant GIB and procedures as well as elevated lactate.  He is making urine.crt back down.  Does not appear to be uremic.  doubt something like rejection. No labs this AM but I dont think necessarily needs  2.Status post renal transplant-continued on his home medications of Prograf 2 mg twice daily and Myfortic 180 mg twice daily as well as prednisone 3.  GIB-  Significant-  Required transfusions and intervention on EGD-  Hopefully stabilizing - did have drop today to hgb 8.1-  Per primary - suspect will need to stay for more obs 4.Covid infection-Per hospitalist. this seems better.   out of isolation 5. Hypertension/volume-blood pressure increasing.  No BP meds and no diuretics, would continue same for now- better today 6.Hyperkalemia-has been an  issue for him in the past..... follow   From a renal standpoint could be discharged. If needs to stay for further obs of GIB I will order chemistries in AM.  Renal will not see daily but will follow labs over the weekend.  If discharged I will arrange follow up at [place]: Basic Metabolic Panel: Recent Labs  Lab 01/22/19 0948 01/23/19 0724 01/24/19 0252  NA 138 140 140  K 5.1 4.5 4.3  CL 112* 112* 113*  CO2 18* 20* 20*  GLUCOSE 226* 163* 148*  BUN 77* 79* 60*  CREATININE 2.65* 2.77* 2.16*  CALCIUM 7.3* 7.5* 7.7*  PHOS  --   --  3.1   Liver Function Tests: Recent Labs  Lab 01/20/19 1446 01/21/19 0500 01/24/19 0252  AST 21 16  --   ALT 35 27  --   ALKPHOS 55 41  --   BILITOT 0.7 0.6  --   PROT 5.4* 4.4*  --   ALBUMIN 2.5* 2.1* 2.1*   No results for input(s): LIPASE, AMYLASE in the last 168 hours. No results for input(s): AMMONIA in the last 168 hours. CBC: Recent Labs  Lab 01/21/19 2151 01/21/19 2151 01/22/19 0948 01/22/19 1800 01/23/19 0724 01/24/19 1526 01/25/19 0342  WBC 12.5*   < > 14.6*  14.8*  --  9.5 8.5 6.6  HGB 6.6*   < >  9.4*  9.3*   < > 9.1* 9.1* 8.2*  HCT 20.7*   < > 28.3*  28.0*   < > 27.7* 27.6* 25.1*  MCV 96.7  --  90.4  90.6  --  87.7 88.5 89.0  PLT 147*   < > 109*  109*  --  96* 99* 97*   < > = values in this interval not displayed.   Cardiac Enzymes: No results for input(s): CKTOTAL, CKMB, CKMBINDEX, TROPONINI in the last 168 hours. CBG: Recent Labs  Lab 01/24/19 1212 01/24/19 1652 01/24/19 2231 01/25/19 0745 01/25/19 1144  GLUCAP 171* 216* 174* 145* 221*    Iron Studies: No results for input(s): IRON, TIBC, TRANSFERRIN, FERRITIN in the last 72 hours. Studies/Results: No results found. Medications: Infusions:   Scheduled Medications: . atorvastatin  40 mg Oral QHS  . gabapentin  300 mg Oral QHS  . insulin aspart  0-9 Units Subcutaneous TID WC  . insulin glargine  12 Units  Subcutaneous QHS  . methimazole  2.5 mg Oral Daily  . multivitamin with minerals  1 tablet Oral Daily  . mycophenolate  180 mg Oral BID  . pantoprazole  40 mg Oral BID  . predniSONE  5 mg Oral Q breakfast  . tacrolimus  2 mg Oral BID  . tamsulosin  0.4 mg Oral QHS    have reviewed scheduled and prn medications.  Physical Exam: General: alert, recognizes me Heart: RRR Lungs: mostly clear Abdomen: soft, slightly tender Extremities: no edema    01/25/2019,12:30 PM  LOS: 5 days

## 2019-01-26 DIAGNOSIS — D62 Acute posthemorrhagic anemia: Secondary | ICD-10-CM

## 2019-01-26 LAB — GLUCOSE, CAPILLARY
Glucose-Capillary: 136 mg/dL — ABNORMAL HIGH (ref 70–99)
Glucose-Capillary: 162 mg/dL — ABNORMAL HIGH (ref 70–99)
Glucose-Capillary: 192 mg/dL — ABNORMAL HIGH (ref 70–99)
Glucose-Capillary: 236 mg/dL — ABNORMAL HIGH (ref 70–99)

## 2019-01-26 LAB — RENAL FUNCTION PANEL
Albumin: 2 g/dL — ABNORMAL LOW (ref 3.5–5.0)
Anion gap: 8 (ref 5–15)
BUN: 35 mg/dL — ABNORMAL HIGH (ref 8–23)
CO2: 20 mmol/L — ABNORMAL LOW (ref 22–32)
Calcium: 7.8 mg/dL — ABNORMAL LOW (ref 8.9–10.3)
Chloride: 114 mmol/L — ABNORMAL HIGH (ref 98–111)
Creatinine, Ser: 1.96 mg/dL — ABNORMAL HIGH (ref 0.61–1.24)
GFR calc Af Amer: 40 mL/min — ABNORMAL LOW (ref >60)
GFR calc non Af Amer: 34 mL/min — ABNORMAL LOW (ref >60)
Glucose, Bld: 156 mg/dL — ABNORMAL HIGH (ref 70–99)
Phosphorus: 2.7 mg/dL (ref 2.5–4.6)
Potassium: 4.4 mmol/L (ref 3.5–5.1)
Sodium: 142 mmol/L (ref 135–145)

## 2019-01-26 LAB — CULTURE, BLOOD (ROUTINE X 2)
Culture: NO GROWTH
Culture: NO GROWTH

## 2019-01-26 LAB — HEMOGLOBIN AND HEMATOCRIT, BLOOD
HCT: 26.3 % — ABNORMAL LOW (ref 39.0–52.0)
Hemoglobin: 8.5 g/dL — ABNORMAL LOW (ref 13.0–17.0)

## 2019-01-26 LAB — CBC
HCT: 25 % — ABNORMAL LOW (ref 39.0–52.0)
Hemoglobin: 7.9 g/dL — ABNORMAL LOW (ref 13.0–17.0)
MCH: 28.7 pg (ref 26.0–34.0)
MCHC: 31.6 g/dL (ref 30.0–36.0)
MCV: 90.9 fL (ref 80.0–100.0)
Platelets: 108 10*3/uL — ABNORMAL LOW (ref 150–400)
RBC: 2.75 MIL/uL — ABNORMAL LOW (ref 4.22–5.81)
RDW: 19.6 % — ABNORMAL HIGH (ref 11.5–15.5)
WBC: 5.5 10*3/uL (ref 4.0–10.5)
nRBC: 0 % (ref 0.0–0.2)

## 2019-01-26 MED ORDER — DEXTROSE-NACL 5-0.9 % IV SOLN: INTRAVENOUS | Status: DC

## 2019-01-26 NOTE — Progress Notes (Signed)
Physical Therapy Treatment Patient Details Name: Jon [name] MRN: [id number] DOB: 11-Nov-1950 Today's Date: 01/26/2019    History of Present Illness Jon [name] is a 69 y.o. male with medical history significant of stage IV chronic kidney disease, status post renal transplant on chronic immunosuppression, type 2 diabetes mellitus, hypertension, history of CVA, hospitalized x 2 for Covid, then Covid pna12/21-22; 12/25-1/1.  Admitted for weakness, melena and GIB now s/p units 5 PRBC's    PT Comments    Pt eager to ambulate in the hall.  Worked on posture, breathing and improving heel/toe pattern.  2 trials of approx 80 feet with the RW.   Follow Up Recommendations  Home health PT;Supervision/Assistance - 24 hour     Equipment Recommendations  None recommended by PT    Recommendations for Other Services       Precautions / Restrictions Precautions Precautions: Fall Precaution Comments: reports he has had near falls    Mobility  Bed Mobility Overal bed mobility: Modified Independent                Transfers Overall transfer level: Needs assistance Equipment used: Rolling walker (2 wheeled) Transfers: Sit to/from Stand Sit to Stand: Min guard(from low surface with rocking for momentum)         General transfer comment: cues for hand placement and guard while pt taking up to 3 rocking trials to get up.  Ambulation/Gait Ambulation/Gait assistance: Min guard Gait Distance (Feet): 80 Feet(x2 with rest in between) Assistive device: Rolling walker (2 wheeled) Gait Pattern/deviations: Step-through pattern Gait velocity: slower Gait velocity interpretation: <1.8 ft/sec, indicate of risk for recurrent falls General Gait Details: generally steady, but notably weak LE's proximally.  Worked on improving heel/toe pattern.  Dyspnea 2/4  sats 95% or more on room air.   Stairs             Wheelchair Mobility    Modified Rankin (Stroke Patients Only)        Balance     Sitting balance-Leahy Scale: Good       Standing balance-Leahy Scale: Fair Standing balance comment: prefers stability of the RW                            Cognition Arousal/Alertness: Awake/alert Behavior During Therapy: WFL for tasks assessed/performed Overall Cognitive Status: Within Functional Limits for tasks assessed                                        Exercises      General Comments        Pertinent Vitals/Pain Pain Assessment: Faces Faces Pain Scale: No hurt Pain Intervention(s): Monitored during session    Home Living                      Prior Function            PT Goals (current goals can now be found in the care plan section) Acute Rehab PT Goals Patient Stated Goal: go home PT Goal Formulation: With patient Time For Goal Achievement: 02/07/19 Potential to Achieve Goals: Good Progress towards PT goals: Progressing toward goals    Frequency    Min 3X/week      PT Plan Current plan remains appropriate    Co-evaluation  AM-PAC PT "6 Clicks" Mobility   Outcome Measure  Help needed turning from your back to your side while in a flat bed without using bedrails?: None Help needed moving from lying on your back to sitting on the side of a flat bed without using bedrails?: A Little Help needed moving to and from a bed to a chair (including a wheelchair)?: A Little Help needed standing up from a chair using your arms (e.g., wheelchair or bedside chair)?: A Little Help needed to walk in hospital room?: A Little Help needed climbing 3-5 steps with a railing? : A Lot 6 Click Score: 18    End of Session   Activity Tolerance: Patient tolerated treatment well(notable fatigue) Patient left: in bed;with call bell/phone within reach;with family/visitor present Nurse Communication: Mobility status PT Visit Diagnosis: Unsteadiness on feet (R26.81);Other abnormalities of gait and  mobility (R26.89);Muscle weakness (generalized) (M62.81);Difficulty in walking, not elsewhere classified (R26.2)     Time: 9563-8756 PT Time Calculation (min) (ACUTE ONLY): 15 min  Charges:  $Gait Training: 8-22 mins                     01/26/2019  Jon Carne., PT Acute Rehabilitation Services [phone]  (pager) [phone]  (office)   Jon [name] 01/26/2019, 4:12 PM

## 2019-01-26 NOTE — Progress Notes (Signed)
PROGRESS NOTE    Jon [name]  LEX:[id number] DOB: 1950/04/12 DOA: 01/20/2019 PCP: [name], MD    Brief Narrative:  69 year old gentleman prior history of stage IV CKD s/p renal planned transplant on chronic Imuran no suppression, hypertension, type 2 diabetes, CVA with no residual deficits discharged from the hospital recently after being treated for Covid pneumonia presents to ED with generalized weakness and hypotension. Patient was originally diagnosed with COVID-19 on 12/27/2018 discharged on 12/31/2018 Readmitted on 01/04/2019 for worsening hypoxia discharged home on steroid taper. Readmitted for generalized weakness hypotension and GI bleed. GI consulted, underwent EGD showing 3 duodenal ulcer with active bleeding resolved which was treated with epi and 2 clips. Patient also received 3 units of PRBC transfusion. Hospital course complicated by mild worsening of the renal function for which nephrology consulted and recommended no further work-up at this time. Transfered the patient out of airborne/isolation on 01/24/19. His creatinine has improved but his hemoglobin dropped from 9.1 to 8.2 to 7.9 today.  Patient had 1 melanotic stool yesterday and another one this afternoon.  GI read consulted to see if he is having ongoing GI bleed.  Appreciate their recommendations and tentatively scheduled for an EGD tomorrow.   Assessment & Plan:   Principal Problem:   Upper GI bleed Active Problems:   History of kidney transplant   History of stroke   OSA (obstructive sleep apnea)   Pulmonary hypertension, unspecified (HCC)   Type 2 diabetes mellitus without complications ([name])   [id number] virus infection   Cerebrovascular accident (CVA) ([place])   GI bleed   Duodenal ulcer hemorrhagic   Acute blood loss anemia    Upper GI bleed secondary to bleeding duodenal ulcers. GI consulted, s/p EGD on 01/21/2018 showing 3 duodenal ulcers with one active bleeding with visible vessels  treated with epi and 2 clips. Continue with PPI twice daily for atleast 8 week and then daily thereafter.  Transfuse to keep hemoglobin greater than 7.  So far patient has received about 5 units of PRBCs. Stool sent for H pylori pcr.  Patient's hemoglobin slowly trending down from 9.1 to 8.2to  7.9. And had one melanotic stool yesterday and 1 black stool this afternoon.  Repeat H&H remains greater than 8.  GI reconsulted to see if he is having ongoing blood loss from the duodenal ulcers. Patient is tentatively scheduled for EGD tomorrow if he continues to drop of hemoglobin.    Acute blood loss anemia from bleeding duodenal ulcers S/p 5 units of PRBC transfusions and hemoglobin dropped to 9.1 to 8.2 to 7.9 today.  2 melanotic stools and the last 24 hours.. Watch for further bleeding episodes and melanotic stools.  Transfuse to keep hemoglobin greater than 7. Last hemoglobin is at 8.5.    Mild AKI on stage IV CKD/s/p renal transplant in 2015 Baseline creatinine between 2-2.5. Slight worsening of the renal parameters earlier in the admission probably secondary to GI bleed and prerenal causes. Repeat creatinine at 1.96 and stable.  Urine analysis is negative and urine output is adequate Appreciate nephrology recommendations.   Recent Covid 19 pneumonia Completed IV  remdesivir and Decadron. DC contact/airborne precaution Jon [name] denies any chest pain or shortness of breath   Type 2 diabetes mellitus Last hemoglobin A1c at 7.8 Continue with Lantus  CBG (last 3)  Recent Labs    01/26/19 0729 01/26/19 1148 01/26/19 1627  GLUCAP 136* 162* 192*   Resume sliding scale insulin, no changes in medications.  History of CVA, no residual deficits Holding aspirin for acute GI bleed.    Hyperthyroidism Continue with methimazole   DVT prophylaxis: SCDs Code Status: Full code Family Communication: family at bedside.  Disposition Plan: PENDING STABLE HEMOGLOBIN.     Consultants:   Nephrology gastroenterology  Procedures: EGD Antimicrobials: None  Subjective: 2 melanotic stools in the last 24 hours.  Patient denies any nausea, vomiting, abdominal pain, diarrhea.  No chest pain or shortness of breath.  Objective: Vitals:   01/25/19 1322 01/25/19 2042 01/26/19 0424 01/26/19 1415  BP: (!) 155/70 (!) 143/73 133/71 138/71  Pulse: 91 92 83 92  Resp: 19 18 16 16   Temp: 98.7 F (37.1 C) 99.4 F (37.4 C) 98.8 F (37.1 C) 99.3 F (37.4 C)  TempSrc: Oral Oral Oral Oral  SpO2: 97% 96% 96% 96%  Weight:      Height:        Intake/Output Summary (Last 24 hours) at 01/26/2019 1736 Last data filed at 01/26/2019 1300 Gross per 24 hour  Intake 1160 ml  Output 1150 ml  Net 10 ml   Filed Weights   01/21/19 1413 01/22/19 1251  Weight: 100 kg 103.9 kg    Examination:  General exam: Alert, comfortable, not in any kind of distress Respiratory system: Clear to auscultation bilaterally, no wheezing or rhonchi Cardiovascular system: S1-S2 heard, regular rate rhythm, no JVD, no pedal edema Gastrointestinal system: Abdomen is soft, nontender, nondistended, bowel sounds normal Central nervous system: Alert and oriented, grossly nonfocal Extremities: No pedal edema, cyanosis or Skin: No rashes seen Psychiatry: Mood is appropriate   Data Reviewed: I have personally reviewed following labs and imaging studies  CBC: Recent Labs  Lab 01/22/19 0948 01/22/19 1800 01/23/19 0724 01/24/19 1526 01/25/19 0342 01/26/19 0405 01/26/19 1537  WBC 14.6*  14.8*  --  9.5 8.5 6.6 5.5  --   HGB 9.4*  9.3*   < > 9.1* 9.1* 8.2* 7.9* 8.5*  HCT 28.3*  28.0*   < > 27.7* 27.6* 25.1* 25.0* 26.3*  MCV 90.4  90.6  --  87.7 88.5 89.0 90.9  --   PLT 109*  109*  --  96* 99* 97* 108*  --    < > = values in this interval not displayed.   Basic Metabolic Panel: Recent Labs  Lab 01/22/19 0948 01/23/19 0724 01/24/19 0252 01/25/19 1306 01/26/19 0405  NA 138 140  140 140 142  K 5.1 4.5 4.3 4.6 4.4  CL 112* 112* 113* 113* 114*  CO2 18* 20* 20* 19* 20*  GLUCOSE 226* 163* 148* 234* 156*  BUN 77* 79* 60* 38* 35*  CREATININE 2.65* 2.77* 2.16* 1.95* 1.96*  CALCIUM 7.3* 7.5* 7.7* 8.0* 7.8*  PHOS  --   --  3.1  --  2.7   GFR: Estimated Creatinine Clearance: 44.8 mL/min (A) (by C-G formula based on SCr of 1.96 mg/dL (H)). Liver Function Tests: Recent Labs  Lab 01/20/19 1446 01/21/19 0500 01/24/19 0252 01/26/19 0405  AST 21 16  --   --   ALT 35 27  --   --   ALKPHOS 55 41  --   --   BILITOT 0.7 0.6  --   --   PROT 5.4* 4.4*  --   --   ALBUMIN 2.5* 2.1* 2.1* 2.0*   No results for input(s): LIPASE, AMYLASE in the last 168 hours. No results for input(s): AMMONIA in the last 168 hours. Coagulation Profile: Recent Labs  Lab 01/21/19 1457  INR 1.1   Cardiac Enzymes: No results for input(s): CKTOTAL, CKMB, CKMBINDEX, TROPONINI in the last 168 hours. BNP (last 3 results) No results for input(s): PROBNP in the last 8760 hours. HbA1C: No results for input(s): HGBA1C in the last 72 hours. CBG: Recent Labs  Lab 01/25/19 1636 01/25/19 2039 01/26/19 0729 01/26/19 1148 01/26/19 1627  GLUCAP 234* 206* 136* 162* 192*   Lipid Profile: No results for input(s): CHOL, HDL, LDLCALC, TRIG, CHOLHDL, LDLDIRECT in the last 72 hours. Thyroid Function Tests: No results for input(s): TSH, T4TOTAL, FREET4, T3FREE, THYROIDAB in the last 72 hours. Anemia Panel: No results for input(s): VITAMINB12, FOLATE, FERRITIN, TIBC, IRON, RETICCTPCT in the last 72 hours. Sepsis Labs: Recent Labs  Lab 01/20/19 1446 01/21/19 2151  LATICACIDVEN 1.7 2.7*    Recent Results (from the past 240 hour(s))  Culture, blood (routine x 2)     Status: None   Collection Time: 01/21/19  9:32 PM   Specimen: BLOOD RIGHT HAND  Result Value Ref Range Status   Specimen Description BLOOD RIGHT HAND  Final   Special Requests   Final    BOTTLES DRAWN AEROBIC AND ANAEROBIC Blood  Culture results may not be optimal due to an inadequate volume of blood received in culture bottles   Culture   Final    NO GROWTH 5 DAYS Performed at [facility], [place] [address] [name], [place] [address]    Report Status 01/26/2019 FINAL  Final  Culture, blood (routine x 2)     Status: None   Collection Time: 01/21/19  9:43 PM   Specimen: BLOOD  Result Value Ref Range Status   Specimen Description BLOOD RIGHT THUMB  Final   Special Requests   Final    BOTTLES DRAWN AEROBIC AND ANAEROBIC Blood Culture results may not be optimal due to an inadequate volume of blood received in culture bottles   Culture   Final    NO GROWTH 5 DAYS Performed at [facility], [place] [address], [place], [place] [address]    Report Status 01/26/2019 FINAL  Final         Radiology Studies: No results found.      Scheduled Meds: . atorvastatin  40 mg Oral QHS  . gabapentin  300 mg Oral QHS  . insulin aspart  0-9 Units Subcutaneous TID WC  . insulin glargine  12 Units Subcutaneous QHS  . methimazole  2.5 mg Oral Daily  . multivitamin with minerals  1 tablet Oral Daily  . mycophenolate  180 mg Oral BID  . pantoprazole  40 mg Oral BID  . predniSONE  5 mg Oral Q breakfast  . tacrolimus  2 mg Oral BID  . tamsulosin  0.4 mg Oral QHS   Continuous Infusions: . dextrose 5 % and 0.9% NaCl       LOS: 6 days        Hosie Poisson, MD [facility] 01/26/2019, 5:36 PM

## 2019-01-26 NOTE — Progress Notes (Signed)
Patient ID: Jon [name], male   DOB: 12-26-1950, 69 y.o.   MRN: [id number]    Progress Note   Subjective  Day # 6  CC; GI bleed  Asked to reevaluate patient today, last seen by GI on 01/24/2019 after he had undergone endoscopic therapy for bleeding duodenal ulcer on 01/22/2019. He had required a total of 5 units of packed RBCs.  Patient noted to have persistent drift in his hemoglobin over the past 2 days. Hemoglobin 9.1 on 01/24/2019, down to 8.2 yesterday and 7.9 today.  He had a very dark brown bowel movement yesterday, no bowel movement since.  He has no complaints of abdominal discomfort or nausea.  Objective   Vital signs in last 24 hours: Temp:  [98.8 F (37.1 C)-99.4 F (37.4 C)] 99.3 F (37.4 C) (01/16 1415) Pulse Rate:  [83-92] 92 (01/16 1415) Resp:  [16-18] 16 (01/16 1415) BP: (133-143)/(71-73) 138/71 (01/16 1415) SpO2:  [96 %] 96 % (01/16 1415) Last BM Date: 01/25/19 General:    Older white male in NAD, wife at bedside Heart:  Regular rate and rhythm; no murmurs Lungs: Respirations even and unlabored, lungs CTA bilaterally Abdomen:  Soft, nontender and nondistended. Normal bowel sounds. Extremities:  Without edema. Neurologic:  Alert and oriented,  grossly normal neurologically. Psych:  Cooperative. Normal mood and affect.  Intake/Output from previous day: 01/15 0701 - 01/16 0700 In: 480 [P.O.:480] Out: 1250 [Urine:1250] Intake/Output this shift: Total I/O In: 920 [P.O.:920] Out: 200 [Urine:200]  Lab Results: Recent Labs    01/24/19 1526 01/25/19 0342 01/26/19 0405  WBC 8.5 6.6 5.5  HGB 9.1* 8.2* 7.9*  HCT 27.6* 25.1* 25.0*  PLT 99* 97* 108*   BMET Recent Labs    01/24/19 0252 01/25/19 1306 01/26/19 0405  NA 140 140 142  K 4.3 4.6 4.4  CL 113* 113* 114*  CO2 20* 19* 20*  GLUCOSE 148* 234* 156*  BUN 60* 38* 35*  CREATININE 2.16* 1.95* 1.96*  CALCIUM 7.7* 8.0* 7.8*   LFT Recent Labs    01/26/19 0405  ALBUMIN 2.0*   PT/INR No  results for input(s): LABPROT, INR in the last 72 hours.     Assessment / Plan:    #41 69 year old white male admitted with acute upper GI bleed on 01/19/2018. EGD on 01/21/2018 revealed an actively bleeding duodenal ulcer with visible vessel which was injected and Hemoclip x2.  Also had 2 other duodenal ulcers/nonbleeding H. pylori negative Ulcers likely aspirin and steroid induced  Patient had done well, however over the past 2 days has noted to have drift in hemoglobin of a little over 1 g. Dark stool yesterday.  He may be having some mild oozing intermittently.  #2 recent COVID-19 infection-admit on steroid taper #3 prior history of CVA-on chronic low-dose aspirin #4 adult onset diabetes mellitus #5 pulmonary hypertension #6 status post renal transplant  Plan; continue twice daily PPI Continue to trend hemoglobin.  Transfuse for hemoglobin 7 or less We will keep him n.p.o. after midnight for possible EGD in a.m. with Dr. Gwynneth Aliment he has any further drift in hemoglobin would plan to proceed.  Plan was discussed with patient and wife who are both in agreement.    Principal Problem:   Upper GI bleed Active Problems:   History of kidney transplant   History of stroke   OSA (obstructive sleep apnea)   Pulmonary hypertension, unspecified (HCC)   Type 2 diabetes mellitus without complications ([place])   [id number] virus infection  Cerebrovascular accident (CVA) ([place])   GI bleed   Duodenal ulcer hemorrhagic     LOS: 6 days   Brendy Ficek PA-C 01/26/2019, 3:02 PM

## 2019-01-27 DIAGNOSIS — K2960 Other gastritis without bleeding: Secondary | ICD-10-CM

## 2019-01-27 DIAGNOSIS — E119 Type 2 diabetes mellitus without complications: Secondary | ICD-10-CM

## 2019-01-27 DIAGNOSIS — G4733 Obstructive sleep apnea (adult) (pediatric): Secondary | ICD-10-CM

## 2019-01-27 DIAGNOSIS — Z794 Long term (current) use of insulin: Secondary | ICD-10-CM

## 2019-01-27 LAB — GLUCOSE, CAPILLARY
Glucose-Capillary: 147 mg/dL — ABNORMAL HIGH (ref 70–99)
Glucose-Capillary: 139 mg/dL — ABNORMAL HIGH (ref 70–99)

## 2019-01-27 LAB — RENAL FUNCTION PANEL
Albumin: 2 g/dL — ABNORMAL LOW (ref 3.5–5.0)
Anion gap: 7 (ref 5–15)
BUN: 27 mg/dL — ABNORMAL HIGH (ref 8–23)
CO2: 20 mmol/L — ABNORMAL LOW (ref 22–32)
Calcium: 7.8 mg/dL — ABNORMAL LOW (ref 8.9–10.3)
Chloride: 114 mmol/L — ABNORMAL HIGH (ref 98–111)
Creatinine, Ser: 1.83 mg/dL — ABNORMAL HIGH (ref 0.61–1.24)
GFR calc Af Amer: 43 mL/min — ABNORMAL LOW (ref >60)
GFR calc non Af Amer: 37 mL/min — ABNORMAL LOW (ref >60)
Glucose, Bld: 184 mg/dL — ABNORMAL HIGH (ref 70–99)
Phosphorus: 2.6 mg/dL (ref 2.5–4.6)
Potassium: 4.5 mmol/L (ref 3.5–5.1)
Sodium: 141 mmol/L (ref 135–145)

## 2019-01-27 LAB — H. PYLORI ANTIGEN, STOOL: H. Pylori Stool Ag, Eia: NEGATIVE

## 2019-01-27 LAB — HEMOGLOBIN AND HEMATOCRIT, BLOOD
HCT: 24.6 % — ABNORMAL LOW (ref 39.0–52.0)
HCT: 25.4 % — ABNORMAL LOW (ref 39.0–52.0)
Hemoglobin: 7.9 g/dL — ABNORMAL LOW (ref 13.0–17.0)
Hemoglobin: 7.9 g/dL — ABNORMAL LOW (ref 13.0–17.0)

## 2019-01-27 SURGERY — ESOPHAGOGASTRODUODENOSCOPY (EGD) WITH PROPOFOL
Anesthesia: Monitor Anesthesia Care

## 2019-01-27 MED ORDER — PANTOPRAZOLE SODIUM 40 MG PO TBEC: 40.0000 mg | DELAYED_RELEASE_TABLET | Freq: Two times a day (BID) | ORAL | 0 refills | Status: AC

## 2019-01-27 NOTE — Plan of Care (Signed)

## 2019-01-27 NOTE — Progress Notes (Signed)
Per GI note- plan is for patient to be discharged today, hgb low but stable. His kidney function is at his baseline.  I have no objections to d/c.  I will arrange follow up at [place]

## 2019-01-27 NOTE — Progress Notes (Signed)
Patient ID: Jon [name], male   DOB: 04/19/1950, 69 y.o.   MRN: [id number]    Progress Note   Subjective   Day # 6 CC; GI bleed-duodenal ulcer with visible vessel  Current patient says he feels fine, been able to ambulate in the hallway.  No further bowel movements.    HGb 7.9 this am- 7.9 yesterday - stable   Objective   Vital signs in last 24 hours: Temp:  [98.4 F (36.9 C)-99.3 F (37.4 C)] 98.7 F (37.1 C) (01/17 0457) Pulse Rate:  [76-92] 76 (01/17 0457) Resp:  [15-18] 15 (01/17 0457) BP: (120-148)/(66-71) 129/66 (01/17 0457) SpO2:  [95 %-98 %] 95 % (01/17 0457) Last BM Date: 01/26/19 General:    white male in NAD Heart:  Regular rate and rhythm; no murmurs Lungs: Respirations even and unlabored, lungs CTA bilaterally Abdomen:  Soft, nontender and nondistended. Normal bowel sounds. Extremities:  Without edema. Neurologic:  Alert and oriented,  grossly normal neurologically. Psych:  Cooperative. Normal mood and affect.  Intake/Output from previous day: 01/16 0701 - 01/17 0700 In: 1608.9 [P.O.:1280; I.V.:328.9] Out: 950 [Urine:950] Intake/Output this shift: Total I/O In: -  Out: 400 [Urine:400]  Lab Results: Recent Labs    01/24/19 1526 01/24/19 1526 01/25/19 0342 01/25/19 0342 01/26/19 0405 01/26/19 0405 01/26/19 1537 01/27/19 0305 01/27/19 0854  WBC 8.5  --  6.6  --  5.5  --   --   --   --   HGB 9.1*   < > 8.2*   < > 7.9*   < > 8.5* 7.9* 7.9*  HCT 27.6*   < > 25.1*   < > 25.0*   < > 26.3* 24.6* 25.4*  PLT 99*  --  97*  --  108*  --   --   --   --    < > = values in this interval not displayed.   BMET Recent Labs    01/25/19 1306 01/26/19 0405 01/27/19 0305  NA 140 142 141  K 4.6 4.4 4.5  CL 113* 114* 114*  CO2 19* 20* 20*  GLUCOSE 234* 156* 184*  BUN 38* 35* 27*  CREATININE 1.95* 1.96* 1.83*  CALCIUM 8.0* 7.8* 7.8*   LFT Recent Labs    01/27/19 0305  ALBUMIN 2.0*   PT/INR No results for input(s): LABPROT, INR in the last 72  hours.  Studies/Results: No results found.     Assessment / Plan:    #39 69 year old white male admitted with acute upper GI bleed in setting of aspirin and steroids. EGD 01/21/2018 revealed an actively bleeding duodenal ulcer with visible vessel that was injected and Hemoclip x2, also had 2 other nonbleeding duodenal ulcers. H. pylori negative  Patient had been noted to have a mild drift in his hemoglobin over the past couple of days and we were asked to reevaluate. Hemoglobin has been trended over the past 24 hours and he is stable without any further drift. He has not had any further bowel movements.  #2 history of prior CVA on chronic low-dose aspirin #3 status post renal transplant #4 pulmonary hypertension #5 recent COVID-19 infection-had been on a steroid taper on admission  Plan; Patient is stable for discharge today. Please discharge on Protonix 40 mg p.o. twice daily for 2 months then plan Protonix 40 mg once daily long-term.  He will follow-up with his PCP in [place] Dr. Lin Landsman later this week for follow-up hemoglobin. Stay off aspirin x5 days then okay to resume.  Principal Problem:   Upper GI bleed Active Problems:   History of kidney transplant   History of stroke   OSA (obstructive sleep apnea)   Pulmonary hypertension, unspecified (HCC)   Type 2 diabetes mellitus without complications ([place])   [id number] virus infection   Cerebrovascular accident (CVA) ([place])   GI bleed   Duodenal ulcer hemorrhagic   Acute blood loss anemia     LOS: 7 days   Gracyn Allor PA-C 01/27/2019, 11:17 AM

## 2019-01-27 NOTE — Discharge Instructions (Signed)
Resume Aspirin in 5 days. Take BP at home and resume Labetalol if BP running high.  See PCP in 1 wk and have the following blood work checked: CBC    You were cared for by a hospitalist during your hospital stay. If you have any questions about your discharge medications or the care you received while you were in the hospital after you are discharged, you can call the unit and asked to speak with the hospitalist on call if the hospitalist that took care of you is not available. Once you are discharged, your primary care physician will handle any further medical issues.   Please note that NO REFILLS for any discharge medications will be authorized once you are discharged, as it is imperative that you return to your primary care physician (or establish a relationship with a primary care physician if you do not have one) for your aftercare needs so that they can reassess your need for medications and monitor your lab values.  Please take all your medications with you for your next visit with your Primary MD. Please ask your Primary MD to get all Hospital records sent to his/her office. Please request your Primary MD to go over all hospital test results at the follow up.   If you experience worsening of your admission symptoms, develop shortness of breath, chest pain, suicidal or homicidal thoughts or a life threatening emergency, you must seek medical attention immediately by calling 911 or calling your MD.   Dennis Bast must read the complete instructions/literature along with all the possible adverse reactions/side effects for all the medicines you take including new medications that have been prescribed to you. Take new medicines after you have completely understood and accpet all the possible adverse reactions/side effects.    Do not drive when taking pain medications or sedatives.     Do not take more than prescribed Pain, Sleep and Anxiety Medications   If you have smoked or chewed Tobacco in the  last 2 yrs please stop. Stop any regular alcohol  and or recreational drug use.   Wear Seat belts while driving.

## 2019-01-27 NOTE — Discharge Summary (Signed)
Physician Discharge Summary  SHEFFIELD HAWKER UXN:[id number] DOB: Nov 23, 1950 DOA: 01/20/2019  PCP: [name], MD  Admit date: 01/20/2019 Discharge date: 01/27/2019  Admitted From: home Disposition:  home   Recommendations for Outpatient Follow-up:  Please check BP at next visit and resume Labetalol as needed Check CBC in 1-2 wks  Discharge Condition:  stable   CODE STATUS:  Full code   Diet recommendation:  Heart healthy, diabetic Consultations:  GI    Discharge Diagnoses:  Principal Problem:   Upper GI bleed Active Problems:   Duodenal ulcer hemorrhagic   Acute blood loss anemia   History of kidney transplant   History of stroke   OSA (obstructive sleep apnea)   Pulmonary hypertension, unspecified ([place])   Type 2 diabetes mellitus without complications ([place])   URKYH-06 virus infection   Cerebrovascular accident (CVA) ([place])    Brief Summary: 69 year old gentleman prior history of stage IV CKD s/p renal planned transplant on chronic Imuran no suppression, hypertension, type 2 diabetes, CVA with no residual deficits discharged from the hospital recently after being treated for Covid pneumonia presents to ED with generalized weakness and hypotension. Patient was originally diagnosed with COVID-19 on 12/27/2018 discharged on 12/31/2018 Readmitted on 01/04/2019 for worsening hypoxia discharged home on steroid taper. Readmitted for generalized weakness hypotension and GI bleed.    Hospital Course:  Upper GI bleed -  EGD no 1/12 > actively bleeding duodenal ulcer with visible vessel that was injected and Hemoclip x2, also had 2 other nonbleeding duodenal ulcers. - has had excessive bleeding and numerous blood transfusions - bleeding has finally abated. He is tolerating a regular diet and is stable to d/c home - d/c with BID PPI x 2 months H pylori will need to be followed by GI  Acute blood loss anemia - has received > 5 U PRBC - Hb now ~ 7-8 - needs to be followed  closely as outpt to ensure it is rising  Renal transplant with CKD 3 - Cr now ~ 2 is his baseline- his Cr was as high as 2.77 and has come down to 1.8 -cont Antirejection meds   Recent COVID 19 infection - in Dec - recuperated   H/o HTN - Labetalol remains on hold as he was hypotensive- BP still not elevated- 129/66 this AM- will need to follow BP as outpt and resume as needed  DM2 - last A1c 7.8 - can cont home meds  H/o CVA - ASA has been on hold due to acute bleeding-resume after 5 days   Discharge Exam: Vitals:   01/26/19 2158 01/27/19 0457  BP: (!) 148/68 129/66  Pulse: 81 76  Resp: 16 15  Temp: 98.8 F (37.1 C) 98.7 F (37.1 C)  SpO2: 98% 95%   Vitals:   01/26/19 1415 01/26/19 2034 01/26/19 2158 01/27/19 0457  BP: 138/71 120/67 (!) 148/68 129/66  Pulse: 92 89 81 76  Resp: 16 18 16 15   Temp: 99.3 F (37.4 C) 98.4 F (36.9 C) 98.8 F (37.1 C) 98.7 F (37.1 C)  TempSrc: Oral Oral Oral Oral  SpO2: 96% 95% 98% 95%  Weight:      Height:        General: Pt is alert, awake, not in acute distress Cardiovascular: RRR, S1/S2 +, no rubs, no gallops Respiratory: CTA bilaterally, no wheezing, no rhonchi Abdominal: Soft, NT, ND, bowel sounds + Extremities: no edema, no cyanosis   Discharge Instructions  Discharge Instructions    Diet - low  sodium heart healthy   Complete by: As directed    Diet Carb Modified   Complete by: As directed    Increase activity slowly   Complete by: As directed      Allergies as of 01/27/2019      Reactions   Lisinopril Other (See Comments)   Increased potassium level   Meloxicam Nausea And Vomiting   Victoza [liraglutide] Diarrhea, Nausea And Vomiting, Swelling      Medication List    STOP taking these medications   aspirin EC 81 MG tablet   labetalol 100 MG tablet Commonly known as: NORMODYNE     TAKE these medications   atorvastatin 20 MG tablet Commonly known as: LIPITOR Take 40 mg by mouth at bedtime.    calcitRIOL 0.25 MCG capsule Commonly known as: ROCALTROL Take 0.25 mcg by mouth See admin instructions. Take 1 capsule (0.25 mcg) by mouth five times weekly - Monday thru Friday   furosemide 40 MG tablet Commonly known as: Lasix Take 1 tablet (40 mg total) by mouth daily.   gabapentin 300 MG capsule Commonly known as: NEURONTIN Take 300 mg by mouth 2 (two) times daily.   HumaLOG KwikPen 100 UNIT/ML KwikPen Generic drug: insulin lispro Inject 5-15 Units into the skin See admin instructions. Inject 5 units subcutaneously three times daily before meals adjusted per carb count  ( add 2 units for every 25 points)   HYDROcodone-acetaminophen 10-325 MG tablet Commonly known as: NORCO Take 1-2 tablets by mouth every 4 (four) hours as needed for moderate pain.   insulin glargine 100 unit/mL Sopn Commonly known as: LANTUS Inject 24 Units into the skin at bedtime.   loperamide 2 MG capsule Commonly known as: IMODIUM Take 1 capsule (2 mg total) by mouth every 12 (twelve) hours.   methimazole 5 MG tablet Commonly known as: TAPAZOLE Take 2.5 mg by mouth daily.   multivitamin with minerals Tabs tablet Take 1 tablet by mouth daily.   mycophenolate 180 MG EC tablet Commonly known as: MYFORTIC Take 180 mg by mouth 2 (two) times daily.   pantoprazole 40 MG tablet Commonly known as: PROTONIX Take 1 tablet (40 mg total) by mouth 2 (two) times daily before a meal. What changed: when to take this   predniSONE 5 MG tablet Commonly known as: DELTASONE Take 1 tablet (5 mg total) by mouth daily with breakfast.   [place] Johns Wort 300 MG Tabs Take 300 mg by mouth 3 (three) times daily.   tacrolimus 1 MG capsule Commonly known as: PROGRAF Take 2 mg by mouth 2 (two) times daily.   tamsulosin 0.4 MG Caps capsule Commonly known as: FLOMAX Take 0.4 mg by mouth at bedtime.   Tradjenta 5 MG Tabs tablet Generic drug: linagliptin Take 5 mg by mouth daily.   vitamin B-12 1000 MCG  tablet Commonly known as: CYANOCOBALAMIN Take 1,000 mcg by mouth 2 (two) times daily.      Follow-up Information    [name], MD Follow up in 1 week(s).   Specialty: Family Medicine Contact information: [place] [address] [phone]        Park Liter, MD .   Specialty: Cardiology Contact information: [address] [place] [address] [phone]          Allergies  Allergen Reactions  . Lisinopril Other (See Comments)    Increased potassium level   . Meloxicam Nausea And Vomiting  . Victoza [Liraglutide] Diarrhea, Nausea And Vomiting and  Swelling     Procedures/Studies:    CT HEAD WO CONTRAST  Result Date: 01/07/2019 CLINICAL DATA:  Encephalopathy.  COVID-19 positive EXAM: CT HEAD WITHOUT CONTRAST TECHNIQUE: Contiguous axial images were obtained from the base of the skull through the vertex without intravenous contrast. COMPARISON:  CT head 07/26/2017 FINDINGS: Brain: Mild cerebral atrophy. Chronic infarct left parietal operculum is unchanged. Negative for acute infarct, hemorrhage, mass. Vascular: Negative for hyperdense vessel Skull: Negative Sinuses/Orbits: Mucosal edema sphenoid sinus. Remaining sinuses clear. Bilateral cataract surgery. Other: None IMPRESSION: No acute abnormality. Chronic infarct left parietal operculum is unchanged. Electronically Signed   By: Franchot Gallo M.D.   On: 01/07/2019 21:39   DG Chest Port 1 View  Result Date: 01/20/2019 CLINICAL DATA:  Weakness.  Low O2 sats. EXAM: PORTABLE CHEST 1 VIEW COMPARISON:  01/04/2019 FINDINGS: Heart is borderline in size. Patchy interstitial and airspace opacities within the lungs. Findings concerning for pneumonia. No effusions or pneumothorax. No acute bony abnormality. IMPRESSION: Patchy bilateral interstitial and airspace opacities are similar to prior study and concerning for pneumonia. Electronically Signed   By: Rolm Baptise M.D.   On: 01/20/2019 16:07    DG Chest Port 1 View  Result Date: 01/04/2019 CLINICAL DATA:  Shortness of breath EXAM: PORTABLE CHEST 1 VIEW COMPARISON:  December 27, 2018 FINDINGS: There is mild cardiomegaly. Increased interstitial markings are seen throughout both lungs. No pleural effusion or pneumothorax. No acute osseous abnormality. IMPRESSION: Increased interstitial markings throughout both lungs which could be due to interstitial edema and/or infectious etiology. Electronically Signed   By: Prudencio Pair M.D.   On: 01/04/2019 00:48   US Abdomen Limited RUQ  Result Date: 01/04/2019 CLINICAL DATA:  69 year old male with abnormal LFTs. Positive COVID-19. History of renal transplant. EXAM: ULTRASOUND ABDOMEN LIMITED RIGHT UPPER QUADRANT COMPARISON:  CT Abdomen and Pelvis 11/25/2017. Renal ultrasound 12/27/2018. FINDINGS: Gallbladder: No gallstones or wall thickening visualized. No sonographic Murphy sign noted by sonographer. Common bile duct: Diameter: 3 millimeters, normal. Liver: Dense liver echotexture, difficult to penetrate. Hepatic steatosis is possible. No discrete liver lesion. No intrahepatic biliary ductal dilatation. Portal vein is patent on color Doppler imaging with normal direction of blood flow towards the liver. Other: Poorly visible atrophic native right kidney. IMPRESSION: 1. Coarse hepatic echotexture suggesting steatosis and/or chronic hepatocellular disease. 2. No discrete liver lesion, evidence of bile duct obstruction, or gallbladder abnormality. Electronically Signed   By: Genevie Ann M.D.   On: 01/04/2019 08:53     The results of significant diagnostics from this hospitalization (including imaging, microbiology, ancillary and laboratory) are listed below for reference.     Microbiology: Recent Results (from the past 240 hour(s))  Culture, blood (routine x 2)     Status: None   Collection Time: 01/21/19  9:32 PM   Specimen: BLOOD RIGHT HAND  Result Value Ref Range Status   Specimen Description BLOOD  RIGHT HAND  Final   Special Requests   Final    BOTTLES DRAWN AEROBIC AND ANAEROBIC Blood Culture results may not be optimal due to an inadequate volume of blood received in culture bottles   Culture   Final    NO GROWTH 5 DAYS Performed at [facility], [place] [address], [place], [place] [address]    Report Status 01/26/2019 FINAL  Final  Culture, blood (routine x 2)     Status: None   Collection Time: 01/21/19  9:43 PM   Specimen: BLOOD  Result Value Ref Range Status  Specimen Description BLOOD RIGHT THUMB  Final   Special Requests   Final    BOTTLES DRAWN AEROBIC AND ANAEROBIC Blood Culture results may not be optimal due to an inadequate volume of blood received in culture bottles   Culture   Final    NO GROWTH 5 DAYS Performed at [facility], [place] [address], [place], [place] [address]    Report Status 01/26/2019 FINAL  Final     Labs: BNP (last 3 results) Recent Labs    01/04/19 0012  BNP 25.4   Basic Metabolic Panel: Recent Labs  Lab 01/23/19 0724 01/24/19 0252 01/25/19 1306 01/26/19 0405 01/27/19 0305  NA 140 140 140 142 141  K 4.5 4.3 4.6 4.4 4.5  CL 112* 113* 113* 114* 114*  CO2 20* 20* 19* 20* 20*  GLUCOSE 163* 148* 234* 156* 184*  BUN 79* 60* 38* 35* 27*  CREATININE 2.77* 2.16* 1.95* 1.96* 1.83*  CALCIUM 7.5* 7.7* 8.0* 7.8* 7.8*  PHOS  --  3.1  --  2.7 2.6   Liver Function Tests: Recent Labs  Lab 01/20/19 1446 01/21/19 0500 01/24/19 0252 01/26/19 0405 01/27/19 0305  AST 21 16  --   --   --   ALT 35 27  --   --   --   ALKPHOS 55 41  --   --   --   BILITOT 0.7 0.6  --   --   --   PROT 5.4* 4.4*  --   --   --   ALBUMIN 2.5* 2.1* 2.1* 2.0* 2.0*   No results for input(s): LIPASE, AMYLASE in the last 168 hours. No results for input(s): AMMONIA in the last 168 hours. CBC: Recent Labs  Lab 01/22/19 0948 01/22/19 1800 01/23/19 0724 01/23/19 0724 01/24/19 1526 01/24/19 1526 01/25/19 0342 01/26/19 0405 01/26/19 1537 01/27/19 0305  01/27/19 0854  WBC 14.6*  14.8*  --  9.5  --  8.5  --  6.6 5.5  --   --   --   HGB 9.4*  9.3*   < > 9.1*   < > 9.1*   < > 8.2* 7.9* 8.5* 7.9* 7.9*  HCT 28.3*  28.0*   < > 27.7*   < > 27.6*   < > 25.1* 25.0* 26.3* 24.6* 25.4*  MCV 90.4  90.6  --  87.7  --  88.5  --  89.0 90.9  --   --   --   PLT 109*  109*  --  96*  --  99*  --  97* 108*  --   --   --    < > = values in this interval not displayed.   Cardiac Enzymes: No results for input(s): CKTOTAL, CKMB, CKMBINDEX, TROPONINI in the last 168 hours. BNP: Invalid input(s): POCBNP CBG: Recent Labs  Lab 01/26/19 1148 01/26/19 1627 01/26/19 2028 01/27/19 0744 01/27/19 1127  GLUCAP 162* 192* 236* 147* 139*   D-Dimer No results for input(s): DDIMER in the last 72 hours. Hgb A1c No results for input(s): HGBA1C in the last 72 hours. Lipid Profile No results for input(s): CHOL, HDL, LDLCALC, TRIG, CHOLHDL, LDLDIRECT in the last 72 hours. Thyroid function studies No results for input(s): TSH, T4TOTAL, T3FREE, THYROIDAB in the last 72 hours.  Invalid input(s): FREET3 Anemia work up No results for input(s): VITAMINB12, FOLATE, FERRITIN, TIBC, IRON, RETICCTPCT in the last 72 hours. Urinalysis    Component Value Date/Time   COLORURINE YELLOW 01/24/2019 1247  APPEARANCEUR CLEAR 01/24/2019 1247   LABSPEC 1.019 01/24/2019 1247   PHURINE 5.0 01/24/2019 1247   GLUCOSEU NEGATIVE 01/24/2019 1247   HGBUR NEGATIVE 01/24/2019 [place] 01/24/2019 1247   KETONESUR NEGATIVE 01/24/2019 1247   PROTEINUR NEGATIVE 01/24/2019 1247   UROBILINOGEN 0.2 01/23/2011 0551   NITRITE NEGATIVE 01/24/2019 1247   LEUKOCYTESUR NEGATIVE 01/24/2019 1247   Sepsis Labs Invalid input(s): PROCALCITONIN,  WBC,  LACTICIDVEN Microbiology Recent Results (from the past 240 hour(s))  Culture, blood (routine x 2)     Status: None   Collection Time: 01/21/19  9:32 PM   Specimen: BLOOD RIGHT HAND  Result Value Ref Range Status   Specimen  Description BLOOD RIGHT HAND  Final   Special Requests   Final    BOTTLES DRAWN AEROBIC AND ANAEROBIC Blood Culture results may not be optimal due to an inadequate volume of blood received in culture bottles   Culture   Final    NO GROWTH 5 DAYS Performed at [facility], [place] [address] [place], [place] [address]    Report Status 01/26/2019 FINAL  Final  Culture, blood (routine x 2)     Status: None   Collection Time: 01/21/19  9:43 PM   Specimen: BLOOD  Result Value Ref Range Status   Specimen Description BLOOD RIGHT THUMB  Final   Special Requests   Final    BOTTLES DRAWN AEROBIC AND ANAEROBIC Blood Culture results may not be optimal due to an inadequate volume of blood received in culture bottles   Culture   Final    NO GROWTH 5 DAYS Performed at [facility], [place] [address] [place], [place] [address]    Report Status 01/26/2019 FINAL  Final     Time coordinating discharge in minutes: 65  SIGNED:   Debbe Odea, MD  [facility] 01/27/2019, 1:06 PM Pager   If 7PM-7AM, please contact night-coverage [email or web address] Password [email or web address]

## 2019-01-28 ENCOUNTER — Encounter: Payer: Self-pay | Admitting: *Deleted

## 2019-01-28 ENCOUNTER — Ambulatory Visit: Payer: Medicare Other | Admitting: Cardiology

## 2019-01-28 ENCOUNTER — Other Ambulatory Visit: Payer: Self-pay | Admitting: *Deleted

## 2019-01-28 NOTE — Patient Outreach (Signed)
[place] [facility]) [place] Telephone Outreach PCP office completes Transition of Care follow up post-hospital discharge Post-hospital discharge day # 1  01/28/2019  Jon [name] [birth year] [id number]  Successful telephone outreach to Jon [name], 69 y/o male referred to [place] by [facility] CMA after post-hospital discharge EMMI Red Alert notification on 01/14/2019; patient declined need for [facility] CM services; second referral subsequently received by [name] Liaison; patient noted to have experienced hospital re-admission.  Patient has had multiple recent hospitalizations, most recently January 10-17, 2021 for GI bleeding/ duodenal ulcer.  Patient was discharged from hospital to home/ self-care with home health services through [facility].  Patient has history including, but not limited to, recent positive corona virus infection, with hospitalization for corona virus associated- pneumonia; prior kideny trnsplant; previous CVA; DM- Type II; pulmonary HTN; and OSA.  HIPAA/ identity verified with patient during phone call today; [facility] CM services discussed with patient and patient provides verbal consent for [facility] CM involvement in his care.  Provides verbal consent to speak with his caregiver/ spouse Jon [name] "at any time" if necessary and Jon [name] participates in phone call today while phone on speaker mode.  Patient reports "doing much better" post recent hospital discharge yesterday.  Denies pain, new/ recent falls.  Patient sounds to be in no distress throughout phone call today, and he and his wife both deny current clinical concerns post- hospital discharge.  Patient/ spouse further report:  -- no concerns around medication; both verbalize accurate understanding of medication changes made at time of hospital discharge and confirm that patient has initiated changes to medications as instructed  -- home health team active; have not yet heard from [facility] team, but confirm  that patient was previously active and that he has contact info: encouraged his ongoing participation, and to to contact home health team to schedule visits, if he has not heard from home health team by tomorrow: spouse verbalizes agreement; patient reports he believes that he has home health PT/ OT, and is not sure if a RN will be visiting- encouraged him to inquire when outreach from home health team is initiated  -- has scheduled post-hospital discharge PCP and eye/ vision provider office visits on Thursday 01/31/2019- encouraged patient to attend as scheduled and coached patient around talking points for office visits  -- denies community resource needs:  States supportive family members that assist with transportation and care needs as indicated ---- SDOH completed for: depression/ transportation/ food insecurity: no needs identified  -- No current advanced Directives for Living Will/ HCPOA in place: patient would like information on same (will mail); basics of Advanced Directive planning discussed with patient and caregiver/ spouse  -- self-health management of chronic disease states of CHF/ HTN/ and DM ---- monitors/ records daily weights/ blood pressures at home; denies concerns- states has lost a lot of weight as a result of recent illness; verbalizes a very good baseline understanding of purpose of daily weight monitoring and recording; reports established action plan to "take extra fluid pill" for weight gain > 3 lbs overnight; weight reported today: 228 lbs (down from baseline pre-illness weight of 248 lbs ---- reports blood pressure this morning of 117/52: reports understands to resume labetolol for increase blood pressure readings at home ---- continues monitoring/ recording blood sugars at home 4 times per day: reports "all in good range," with fasting blood sugar this morning of "136" ---- endorses follows heart healthy/ low sodium/ diabetic diet: encouraged to continue  Patient denies  further issues, concerns, or problems today.  I explained that I was contacting patient for primary [facility] RN CM Jon [name], and that she would contact patient again within the next 2 weeks; provided my direct contact information and Jon [name]'s phone number should needs, questions, issues, or concerns arise prior to next outreach; patient/ spouse agreed to do so.  Plan:  Patient will take medications as prescribed and will attend all scheduled provider appointments  Patient will promptly notify care providers for any new concerns/ issues/ problems that arise  Patient will actively participate in home health services as ordered post-hospital discharge  Patient will continue monitoring/ recording daily weights, blood pressures, and blood sugars as per baseline   I will make patient's PCP aware of [place] RN CM involvement in patient's care-- will send barriers letter  I will mail patient educational information around Advanced Directive planning  Will update patient's primary [facility] RN CM of today's successful telephone outreach to patient  [place] Problem One     Most Recent Value  Care Plan Problem One  High risk for hospital readmission related to/ as evidenced by multiple recent hospitalizations for corona virus with complications and GI bleeding  Role Documenting the Problem One  Care Management Coordinator  Care Plan for Problem One  Active  [facility] Long Term Goal   Over the next 31 days, patient will not experience unplanned hospital readmission as evidenced by patient reporting and review of EMR during [facility] RN CM outreach  [facility] Long Term Goal Start Date  01/28/19  Interventions for Problem One Long Term Goal  Discussed with patient and caregiver his current clinical condition and confirmed that neither have current clinical concerns,  confirmed that patient and spouse are able to verbalize post-recent hospital discharge instructions around medications changes and that patient has no  current concerns around medications,  initiated [facility] CM program and initial assessment  [facility] CM Short Term Goal #1   Over the next 30 days, patient will actively participate in home health services as ordered post- hospital discharge, as evidenced by patient reporting and collaboration with home health team as indicated during [facility] RN CM outreach  [facility] CM Short Term Goal #1 Start Date  01/28/19  Interventions for Short Term Goal #1  Confirmed that patient has home health services in place post-hospital discharge and that he has contact information for home health team,  encouraged patient's active participation in home health services post- hospital discharge  [facility] CM Short Term Goal #2   Over the next 30 days, patient will attend all scheduled provider office appointments as evidenced by patient reporting and collaboration with care providers as indicated during [facility] RN CM outreach  [facility] CM Short Term Goal #2 Start Date  01/28/19  Interventions for Short Term Goal #2  Confirmed that patient has scheduled post-hospital discharge PCP office visit on Thursday 01/31/2019,  encouraged patient to take medications/ BP/ daily weight/ blood sugar records at home to appointment,  encouraged patient to discuss indications for corona virus vaccine with provider,  confirmed that patient has reliable transportation through family members     Oneta Rack, RN, BSN, [employer] Coordinator [facility] Care Management  [phone]

## 2019-01-30 ENCOUNTER — Telehealth: Payer: Self-pay | Admitting: Cardiology

## 2019-01-30 DIAGNOSIS — E1122 Type 2 diabetes mellitus with diabetic chronic kidney disease: Secondary | ICD-10-CM | POA: Diagnosis not present

## 2019-01-30 DIAGNOSIS — I1310 Hypertensive heart and chronic kidney disease without heart failure, with stage 1 through stage 4 chronic kidney disease, or unspecified chronic kidney disease: Secondary | ICD-10-CM | POA: Diagnosis not present

## 2019-01-30 DIAGNOSIS — J9601 Acute respiratory failure with hypoxia: Secondary | ICD-10-CM | POA: Diagnosis not present

## 2019-01-30 NOTE — Telephone Encounter (Signed)
Stay away from labetalol if blood pressures lower than 140/90

## 2019-01-30 NOTE — Telephone Encounter (Signed)
What meds is he on for BP now?

## 2019-01-30 NOTE — Telephone Encounter (Signed)
New Message  Dorothy, Nurse from [facility], is calling in to speak with Dr. Agustin Cree or his nurse about patient. Patient has been in the hospital from 01/20/19- 01/27/19, since patient has been home his blood pressure is 120/73 HR 97 and was told to hold blood pressure medication unless blood pressure unless blood pressure is elevated. Nurse would like to be advised on patient holding blood pressure medication. Please give a call back at [phone] ext [phone]. And call patient to advise as well.

## 2019-01-31 ENCOUNTER — Ambulatory Visit: Payer: Medicare Other | Admitting: Internal Medicine

## 2019-01-31 DIAGNOSIS — K922 Gastrointestinal hemorrhage, unspecified: Secondary | ICD-10-CM | POA: Diagnosis not present

## 2019-01-31 DIAGNOSIS — Z940 Kidney transplant status: Secondary | ICD-10-CM | POA: Diagnosis not present

## 2019-01-31 DIAGNOSIS — I259 Chronic ischemic heart disease, unspecified: Secondary | ICD-10-CM | POA: Diagnosis not present

## 2019-01-31 NOTE — Telephone Encounter (Signed)
Called patient back and spoke with his wife Jon [name], she is on [place]. I informed her of Dr. Marthann Schiller recommendations and she thanked me. She had no further questions at this time. I told her to call back with any other concerns. She thanked me.

## 2019-02-01 DIAGNOSIS — I1310 Hypertensive heart and chronic kidney disease without heart failure, with stage 1 through stage 4 chronic kidney disease, or unspecified chronic kidney disease: Secondary | ICD-10-CM | POA: Diagnosis not present

## 2019-02-01 DIAGNOSIS — J9601 Acute respiratory failure with hypoxia: Secondary | ICD-10-CM | POA: Diagnosis not present

## 2019-02-01 DIAGNOSIS — E1122 Type 2 diabetes mellitus with diabetic chronic kidney disease: Secondary | ICD-10-CM | POA: Diagnosis not present

## 2019-02-05 ENCOUNTER — Other Ambulatory Visit: Payer: Self-pay

## 2019-02-05 DIAGNOSIS — E1122 Type 2 diabetes mellitus with diabetic chronic kidney disease: Secondary | ICD-10-CM | POA: Diagnosis not present

## 2019-02-05 DIAGNOSIS — J9601 Acute respiratory failure with hypoxia: Secondary | ICD-10-CM | POA: Diagnosis not present

## 2019-02-05 DIAGNOSIS — I1310 Hypertensive heart and chronic kidney disease without heart failure, with stage 1 through stage 4 chronic kidney disease, or unspecified chronic kidney disease: Secondary | ICD-10-CM | POA: Diagnosis not present

## 2019-02-05 NOTE — Patient Outreach (Signed)
Telephone assessment:  Placed call to patient who answered and identified himself. Reports that he is feeling great. Denies any additional bleeding. Reports no breathing problems. Reports kidney transplant is doing well as far as he knows.  Reports that home health has started. Reports he is walking daily. Reports no new problems or concerns.   PLAN: will follow up in 1 week.  Tomasa Rand, RN, BSN, CEN [facility] [employer] [phone]

## 2019-02-06 DIAGNOSIS — G4733 Obstructive sleep apnea (adult) (pediatric): Secondary | ICD-10-CM | POA: Diagnosis not present

## 2019-02-07 ENCOUNTER — Telehealth: Payer: Self-pay | Admitting: Cardiology

## 2019-02-07 DIAGNOSIS — E1122 Type 2 diabetes mellitus with diabetic chronic kidney disease: Secondary | ICD-10-CM | POA: Diagnosis not present

## 2019-02-07 DIAGNOSIS — I1310 Hypertensive heart and chronic kidney disease without heart failure, with stage 1 through stage 4 chronic kidney disease, or unspecified chronic kidney disease: Secondary | ICD-10-CM | POA: Diagnosis not present

## 2019-02-07 DIAGNOSIS — J9601 Acute respiratory failure with hypoxia: Secondary | ICD-10-CM | POA: Diagnosis not present

## 2019-02-07 NOTE — Telephone Encounter (Signed)
I spoke to the patient who is requesting that his wife accompany him to the appt with Dr Agustin Cree, because of memory loss.

## 2019-02-07 NOTE — Telephone Encounter (Signed)
Patient wanted to know if his wife would be allowed to come with him to his appointment Tuesday 02-12-19. Please let the patient know what the office decides

## 2019-02-08 DIAGNOSIS — Z940 Kidney transplant status: Secondary | ICD-10-CM | POA: Diagnosis not present

## 2019-02-08 DIAGNOSIS — I10 Essential (primary) hypertension: Secondary | ICD-10-CM | POA: Diagnosis not present

## 2019-02-08 DIAGNOSIS — D631 Anemia in chronic kidney disease: Secondary | ICD-10-CM | POA: Diagnosis not present

## 2019-02-08 DIAGNOSIS — N2581 Secondary hyperparathyroidism of renal origin: Secondary | ICD-10-CM | POA: Diagnosis not present

## 2019-02-08 DIAGNOSIS — E1129 Type 2 diabetes mellitus with other diabetic kidney complication: Secondary | ICD-10-CM | POA: Diagnosis not present

## 2019-02-08 DIAGNOSIS — R809 Proteinuria, unspecified: Secondary | ICD-10-CM | POA: Diagnosis not present

## 2019-02-09 DIAGNOSIS — G4733 Obstructive sleep apnea (adult) (pediatric): Secondary | ICD-10-CM | POA: Diagnosis not present

## 2019-02-09 DIAGNOSIS — I10 Essential (primary) hypertension: Secondary | ICD-10-CM | POA: Diagnosis not present

## 2019-02-09 DIAGNOSIS — N189 Chronic kidney disease, unspecified: Secondary | ICD-10-CM | POA: Diagnosis not present

## 2019-02-09 DIAGNOSIS — E1165 Type 2 diabetes mellitus with hyperglycemia: Secondary | ICD-10-CM | POA: Diagnosis not present

## 2019-02-12 ENCOUNTER — Encounter: Payer: Self-pay | Admitting: Cardiology

## 2019-02-12 ENCOUNTER — Ambulatory Visit (INDEPENDENT_AMBULATORY_CARE_PROVIDER_SITE_OTHER): Payer: Medicare Other | Admitting: Cardiology

## 2019-02-12 ENCOUNTER — Other Ambulatory Visit: Payer: Self-pay

## 2019-02-12 VITALS — BP 160/62 | HR 109 | Ht 71.0 in | Wt 230.0 lb

## 2019-02-12 DIAGNOSIS — I272 Pulmonary hypertension, unspecified: Secondary | ICD-10-CM | POA: Diagnosis not present

## 2019-02-12 DIAGNOSIS — J9601 Acute respiratory failure with hypoxia: Secondary | ICD-10-CM | POA: Diagnosis not present

## 2019-02-12 DIAGNOSIS — I5033 Acute on chronic diastolic (congestive) heart failure: Secondary | ICD-10-CM | POA: Diagnosis not present

## 2019-02-12 DIAGNOSIS — Z949 Transplanted organ and tissue status, unspecified: Secondary | ICD-10-CM

## 2019-02-12 DIAGNOSIS — E1122 Type 2 diabetes mellitus with diabetic chronic kidney disease: Secondary | ICD-10-CM | POA: Diagnosis not present

## 2019-02-12 DIAGNOSIS — I1310 Hypertensive heart and chronic kidney disease without heart failure, with stage 1 through stage 4 chronic kidney disease, or unspecified chronic kidney disease: Secondary | ICD-10-CM | POA: Diagnosis not present

## 2019-02-12 DIAGNOSIS — I158 Other secondary hypertension: Secondary | ICD-10-CM

## 2019-02-12 DIAGNOSIS — K264 Chronic or unspecified duodenal ulcer with hemorrhage: Secondary | ICD-10-CM | POA: Diagnosis not present

## 2019-02-12 NOTE — Patient Outreach (Signed)
Telephone assessment:  Placed call to patient who reports that he is doing great. Reports no recent bleeding. Reports he is going to cardiology office visit today with wife.   Denies any new problems or concerns today.  Reviewed medication concerns. Patient reports that he is having problems with his insulin. Reports he has contacted Dr. Welford Roche office about refills. Currently patient is using samples.  CBG today of 141.  PLAN: will plan follow up call in 1 week.  Tomasa Rand, RN, BSN, CEN [facility] [employer] [phone]

## 2019-02-12 NOTE — Patient Instructions (Signed)

## 2019-02-12 NOTE — Progress Notes (Signed)
Cardiology Office Note:    Date:  02/12/2019   ID:  FOCH ROSENWALD, DOB [birth year], MRN [id number]  PCP:  [name], MD  Cardiologist:  [name], MD    Referring MD: [name], MD   Chief Complaint  Patient presents with  . Follow-up    Hospital FU    History of Present Illness:    Jon [name] is a 69 y.o. male with complex past medical history.  That include renal transplant, ascending aortic aneurysm measuring in May of last year of 39 mm, essential hypertension, pulmonary hypertension, diabetes.  He comes today to my office for follow-up.  He end up having COVID-19 infection and that being sick after that he have peptic ulcer disease that required blood transfusion.  Now he is feeling much better and stronger seems to recovered nicely after all the travel.  Denies having any shortness of breath, no chest pain, no tightness no pressure no burning in the chest.  Some of his medication has been modified while in the hospital including discontinuation of labetalol.  He brought blood pressure measurements from home to me and his blood pressure usually is 427 and below systolic.  I told him as well as to his wife to watch his blood pressure very carefully if his blood pressure maintained above 130 he need to get in touch with me and then will put him on small maintenance dose of labetalol.  Past Medical History:  Diagnosis Date  . Abnormal gait   . Anemia of chronic disease   . Arthritis    back, hands   . Chronic back pain    radiculopathy and stenosis  . Chronic ischemic heart disease    Severe LVH  . Chronic kidney disease    [name], awaiting Transplant   . Congestive heart failure (CHF) ([place])   . CVA (cerebral infarction) 03/2013  . Diabetes mellitus     Lantus nightly ;type 2  . GERD (gastroesophageal reflux disease)   . H/O hiatal hernia   . History of colon polyps   . Hx of cardiovascular stress test    a.  Lexiscan Myoview (05/2013):  No  ischemia, EF 53%; Normal Study  . Hyperlipidemia    takes Lovastatin daily  . Hypertension    takes Amlodipine and Catapres daily    dr Forrestine Him, Loop Recorder - [facility]  . Nocturia   . Obesity   . Peripheral edema    takes Lasix daily  . Sleep apnea    cpap , study in their home, 09/2102- [employer]           . Stroke (HCC)    speech, rt arm weakness  . Urinary frequency     Past Surgical History:  Procedure Laterality Date  . AV FISTULA PLACEMENT Left 04/03/2013   Procedure: ARTERIOVENOUS (AV) FISTULA CREATION- LEFT RADIOCEPHALIC VS BRACHIOCEPHALIC;  Surgeon: Conrad Webberville, MD;  Location: [place];  Service: Vascular;  Laterality: Left;  . AV FISTULA PLACEMENT Left 05/14/2013   Procedure: LEFT BRACHIOCEPHALIC ARTERIOVENOUS (AV) FISTULA CREATION;  Surgeon: Conrad Yuba, MD;  Location: [place];  Service: Vascular;  Laterality: Left;  . BACK SURGERY  12/2012   2003- 1st back surgery & then 2014- fusion  . COLONOSCOPY    . ESOPHAGOGASTRODUODENOSCOPY    . ESOPHAGOGASTRODUODENOSCOPY (EGD) WITH PROPOFOL N/A 01/22/2019   Procedure: ESOPHAGOGASTRODUODENOSCOPY (EGD) WITH PROPOFOL;  Surgeon: [name], MD;  Location: [place];  Service: Gastroenterology;  Laterality: N/A;  . HEMORRHOID SURGERY    . HEMOSTASIS CLIP PLACEMENT  01/22/2019   Procedure: HEMOSTASIS CLIP PLACEMENT;  Surgeon: [name], MD;  Location[facility] ENDOSCOPY;  Service: Gastroenterology;;  . KIDNEY TRANSPLANT  10/01/2013  . left knee surgery    . LOOP RECORDER IMPLANT  03/19/13   MDT LinQ implanted by Dr Rayann Heman for cryptogenic stroke  . LOOP RECORDER IMPLANT N/A 03/19/2013   Procedure: LOOP RECORDER IMPLANT;  Surgeon: Coralyn Mark, MD;  Location: [place] CATH LAB;  Service: Cardiovascular;  Laterality: N/A;  . NEPHRECTOMY TRANSPLANTED ORGAN    . NEUROPLASTY / TRANSPOSITION MEDIAN NERVE AT CARPAL TUNNEL BILATERAL    . right leg surgery     pin in place  . right wrist surgery    . SCLEROTHERAPY  01/22/2019   Procedure:  Clide Deutscher;  Surgeon: [name], MD;  Location: [facility] ENDOSCOPY;  Service: Gastroenterology;;  . TEE WITHOUT CARDIOVERSION N/A 03/19/2013   Procedure: TRANSESOPHAGEAL ECHOCARDIOGRAM (TEE);  Surgeon: [name], MD;  Location: [facility] ENDOSCOPY;  Service: Cardiovascular;  Laterality: N/A;  . THYROID SURGERY  10/13/2015   Performed at [facility] with surgical pathology revealed consistent with benign follicular nodule ([name] category II)    Current Medications: Current Meds  Medication Sig  . aspirin 81 MG EC tablet Take 81 mg by mouth daily. Swallow whole.  [name] atorvastatin (LIPITOR) 20 MG tablet Take 40 mg by mouth at bedtime.  . calcitRIOL (ROCALTROL) 0.25 MCG capsule Take 0.25 mcg by mouth See admin instructions. Take 1 capsule (0.25 mcg) by mouth five times weekly - Monday thru Friday  . furosemide (LASIX) 40 MG tablet Take 1 tablet (40 mg total) by mouth daily.  [name] gabapentin (NEURONTIN) 300 MG capsule Take 300 mg by mouth 2 (two) times daily.  [name] HYDROcodone-acetaminophen (NORCO) 10-325 MG tablet Take 1-2 tablets by mouth every 4 (four) hours as needed for moderate pain.   [name] insulin glargine (LANTUS) 100 unit/mL SOPN Inject 24 Units into the skin at bedtime.   . insulin lispro (HUMALOG KWIKPEN) 100 UNIT/ML KwikPen Inject 5-15 Units into the skin See admin instructions. Inject 5 units subcutaneously three times daily before meals adjusted per carb count  ( add 2 units for every 25 points)  . loperamide (IMODIUM) 2 MG capsule Take 1 capsule (2 mg total) by mouth every 12 (twelve) hours.  . methimazole (TAPAZOLE) 5 MG tablet Take 2.5 mg by mouth daily.   . Multiple Vitamin (MULTIVITAMIN WITH MINERALS) TABS tablet Take 1 tablet by mouth daily.  . mycophenolate (MYFORTIC) 180 MG EC tablet Take 180 mg by mouth 2 (two) times daily.   . pantoprazole (PROTONIX) 40 MG tablet Take 1 tablet (40 mg total) by mouth 2 (two) times daily before a meal.  . predniSONE (DELTASONE) 5 MG tablet Take 1 tablet (5 mg  total) by mouth daily with breakfast.  . St Johns Wort 300 MG TABS Take 300 mg by mouth 3 (three) times daily.  . tacrolimus (PROGRAF) 1 MG capsule Take 2 mg by mouth 2 (two) times daily.  . tamsulosin (FLOMAX) 0.4 MG CAPS capsule Take 0.4 mg by mouth at bedtime.  . TRADJENTA 5 MG TABS tablet Take 5 mg by mouth daily.  . vitamin B-12 (CYANOCOBALAMIN) 1000 MCG tablet Take 1,000 mcg by mouth 2 (two) times daily.      Allergies:   Lisinopril, Meloxicam, and Victoza [liraglutide]   Social History   Socioeconomic History  . Marital status: Married  Spouse name: agnes  . Number of children: 5  . Years of education: 8th  . Highest education level: Not on file  Occupational History  . Occupation: disabled  Tobacco Use  . Smoking status: Former Smoker    Packs/day: 0.50    Years: 40.00    Pack years: 20.00    Types: Cigarettes    Quit date: 12/27/2012    Years since quitting: 6.1  . Smokeless tobacco: Never Used  Substance and Sexual Activity  . Alcohol use: No    Alcohol/week: 0.0 standard drinks  . Drug use: No  . Sexual activity: Yes  Other Topics Concern  . Not on file  Social History Narrative   Patient lives at home with his wife Herbert Pun).  One story home with basement.   Patient is disabled.   Education 8th grade.   Right handed.   Caffeine One cup of coffee daily.   Retired Administrator.   5 children.   Social Determinants of Health   Financial Resource Strain:   . Difficulty of Paying Living Expenses: Not on file  Food Insecurity: No Food Insecurity  . Worried About Charity fundraiser in the Last Year: Never true  . Ran Out of Food in the Last Year: Never true  Transportation Needs: No Transportation Needs  . Lack of Transportation (Medical): No  . Lack of Transportation (Non-Medical): No  Physical Activity:   . Days of Exercise per Week: Not on file  . Minutes of Exercise per Session: Not on file  Stress:   . Feeling of Stress : Not on file  Social  Connections:   . Frequency of Communication with Friends and Family: Not on file  . Frequency of Social Gatherings with Friends and Family: Not on file  . Attends Religious Services: Not on file  . Active Member of Clubs or Organizations: Not on file  . Attends Archivist Meetings: Not on file  . Marital Status: Not on file     Family History: The patient's family history includes Diabetes in his brother; Heart disease in his father; Hyperlipidemia in his father and son; Hypertension in his father and mother; Renal Disease in his father. ROS:   Please see the history of present illness.    All 14 point review of systems negative except as described per history of present illness  EKGs/Labs/Other Studies Reviewed:      Recent Labs: 01/04/2019: B Natriuretic Peptide 59.0 01/10/2019: Magnesium 2.3 01/21/2019: ALT 27 01/26/2019: Platelets 108 01/27/2019: BUN 27; Creatinine, Ser 1.83; Hemoglobin 7.9; Potassium 4.5; Sodium 141  Recent Lipid Panel    Component Value Date/Time   CHOL 137 01/02/2017 0343   TRIG 113 01/04/2019 0011   HDL 38 (L) 01/02/2017 0343   CHOLHDL 3.6 01/02/2017 0343   VLDL 16 01/02/2017 0343   LDLCALC 83 01/02/2017 0343    Physical Exam:    VS:  BP (!) 160/62   Pulse (!) 109   Ht 5\' 11"  (1.803 m)   Wt 230 lb (104.3 kg)   SpO2 96%   BMI 32.08 kg/m     Wt Readings from Last 3 Encounters:  02/12/19 230 lb (104.3 kg)  01/22/19 229 lb (103.9 kg)  01/07/19 220 lb 14.4 oz (100.2 kg)     GEN:  Well nourished, well developed in no acute distress HEENT: Normal NECK: No JVD; No carotid bruits LYMPHATICS: No lymphadenopathy CARDIAC: RRR, no murmurs, no rubs, no gallops RESPIRATORY:  Clear to  auscultation without rales, wheezing or rhonchi  ABDOMEN: Soft, non-tender, non-distended MUSCULOSKELETAL:  No edema; No deformity  SKIN: Warm and dry LOWER EXTREMITIES: no swelling NEUROLOGIC:  Alert and oriented x 3 PSYCHIATRIC:  Normal affect    ASSESSMENT:    1. Hypertension associated with transplantation   2. Pulmonary hypertension, unspecified ([place])   3. Acute on chronic diastolic CHF (congestive heart failure) ([place])   4. Duodenal ulcer hemorrhagic    PLAN:    In order of problems listed above:  1. Hypertension, blood pressure appears to be well controlled right now continue present management. 2. Pulmonary hypertension.  We will repeat his echocardiogram in December of this year.  Denies having any new symptoms. 3. History of bleeding ulcer.  Doing well stable from that point of view. 4. Overall he is doing well at continue present management.  I see him back in about 5 months but his wife will call me within the next month and bring me his blood pressure measurements.   Medication Adjustments/Labs and Tests Ordered: Current medicines are reviewed at length with the patient today.  Concerns regarding medicines are outlined above.  No orders of the defined types were placed in this encounter.  Medication changes: No orders of the defined types were placed in this encounter.   Signed, Park Liter, MD, [facility] 02/12/2019 2:37 PM    [place]

## 2019-02-19 ENCOUNTER — Other Ambulatory Visit: Payer: Self-pay

## 2019-02-19 DIAGNOSIS — D631 Anemia in chronic kidney disease: Secondary | ICD-10-CM | POA: Diagnosis not present

## 2019-02-19 DIAGNOSIS — N189 Chronic kidney disease, unspecified: Secondary | ICD-10-CM | POA: Diagnosis not present

## 2019-02-19 NOTE — Patient Outreach (Signed)
Telephone assessment:  Placed call to patient who answered and reports he is doing well. Reports that cardiology appointment went well. Report DM is under control.  Reports has all his medications and is taking them as prescribed. Reports no recent bleeding, no fever and no shortness of breath.  Plan: Will follow up in 1 week.  Tomasa Rand, RN, BSN, CEN [place] [facility] [phone]

## 2019-02-20 DIAGNOSIS — J9601 Acute respiratory failure with hypoxia: Secondary | ICD-10-CM | POA: Diagnosis not present

## 2019-02-20 DIAGNOSIS — E1122 Type 2 diabetes mellitus with diabetic chronic kidney disease: Secondary | ICD-10-CM | POA: Diagnosis not present

## 2019-02-20 DIAGNOSIS — I1310 Hypertensive heart and chronic kidney disease without heart failure, with stage 1 through stage 4 chronic kidney disease, or unspecified chronic kidney disease: Secondary | ICD-10-CM | POA: Diagnosis not present

## 2019-02-21 DIAGNOSIS — N200 Calculus of kidney: Secondary | ICD-10-CM | POA: Diagnosis not present

## 2019-02-25 DIAGNOSIS — R030 Elevated blood-pressure reading, without diagnosis of hypertension: Secondary | ICD-10-CM | POA: Insufficient documentation

## 2019-02-25 DIAGNOSIS — M961 Postlaminectomy syndrome, not elsewhere classified: Secondary | ICD-10-CM | POA: Diagnosis not present

## 2019-02-25 DIAGNOSIS — M47812 Spondylosis without myelopathy or radiculopathy, cervical region: Secondary | ICD-10-CM | POA: Diagnosis not present

## 2019-02-25 HISTORY — DX: Elevated blood-pressure reading, without diagnosis of hypertension: R03.0

## 2019-02-26 ENCOUNTER — Other Ambulatory Visit: Payer: Self-pay

## 2019-02-26 DIAGNOSIS — N189 Chronic kidney disease, unspecified: Secondary | ICD-10-CM | POA: Diagnosis not present

## 2019-02-26 DIAGNOSIS — D631 Anemia in chronic kidney disease: Secondary | ICD-10-CM | POA: Diagnosis not present

## 2019-02-26 NOTE — Patient Outreach (Signed)
Telephone assessment:  Placed call to patient with no answer. Placed call to wife who reports patient is doing well. Denies any recent bleeding. Reports patient is having a spinal infusion today. Wife denies any new problems or concerns at this time.  Plan: Will call patient back in 1 week and then close case if doing well.  Tomasa Rand, RN, BSN, CEN [facility] [employer] [phone]

## 2019-02-27 DIAGNOSIS — N189 Chronic kidney disease, unspecified: Secondary | ICD-10-CM | POA: Diagnosis not present

## 2019-02-27 DIAGNOSIS — D631 Anemia in chronic kidney disease: Secondary | ICD-10-CM | POA: Diagnosis not present

## 2019-02-27 DIAGNOSIS — D509 Iron deficiency anemia, unspecified: Secondary | ICD-10-CM | POA: Diagnosis not present

## 2019-03-04 ENCOUNTER — Other Ambulatory Visit: Payer: Self-pay

## 2019-03-04 NOTE — Patient Outreach (Signed)
Case closure note:  Placed call to patient with no answer. Patient returned call and states that he is doing very well. Denies any recent GI bleeding and no new issues.    Reviewed case closure with patient and he is in agreement. Goals met  PLAN: will send case closure letter to patient and MD.  Tomasa Rand, RN, BSN, CEN [place] Coordinator [phone]

## 2019-03-06 ENCOUNTER — Ambulatory Visit: Payer: Medicare Other | Admitting: Internal Medicine

## 2019-03-06 ENCOUNTER — Encounter: Payer: Self-pay | Admitting: Internal Medicine

## 2019-03-06 ENCOUNTER — Other Ambulatory Visit: Payer: Self-pay

## 2019-03-06 DIAGNOSIS — I272 Pulmonary hypertension, unspecified: Secondary | ICD-10-CM | POA: Diagnosis not present

## 2019-03-06 DIAGNOSIS — J9601 Acute respiratory failure with hypoxia: Secondary | ICD-10-CM

## 2019-03-06 DIAGNOSIS — J1282 Pneumonia due to coronavirus disease 2019: Secondary | ICD-10-CM | POA: Diagnosis not present

## 2019-03-06 DIAGNOSIS — U071 COVID-19: Secondary | ICD-10-CM

## 2019-03-06 DIAGNOSIS — G4733 Obstructive sleep apnea (adult) (pediatric): Secondary | ICD-10-CM | POA: Diagnosis not present

## 2019-03-06 NOTE — Assessment & Plan Note (Signed)
Not dependent on O2 at this time

## 2019-03-06 NOTE — Patient Instructions (Signed)
We can continue CPAP auto 5-20, mask of choice, humidifier, supplies, AirView/ card  Please call if we can help 

## 2019-03-06 NOTE — Assessment & Plan Note (Signed)
Benefits from CPAP and download confrms compliance and control. Plan- continue auto 5-20

## 2019-03-06 NOTE — Assessment & Plan Note (Signed)
Clinically resolved.  

## 2019-03-06 NOTE — Assessment & Plan Note (Signed)
Without dyspnea and without R side failure signs at this exam. Consider recheck overnight oximetry in future

## 2019-03-06 NOTE — Progress Notes (Signed)
HPI  male former smoker followed for OSA, complicated by ESRD/Transplant, DM 2, GERD, CVA, chronic back pain Unattended Home Sleep Test 10/24/12-AHI 24/hour, desaturation to 81%. PFT 12/28/16-mild restriction, mild reduction of diffusion.  No response to bronchodilator.  FVC 3.28/70%, FEV1 2.64/75%, ratio 0.81, TLC 77%, DLCO 66%. -------------------------------------------------------------------------------   01/29/2018- 69 year old male former smoker followed for OSA, DOE complicated by ESRD/transplant, DM 2, GERD, CVA, chronic back pain, Pulmonary Hypertension ([place]) -----OSA: DME APS: Pt wears CPAP nightly and pressure works well; no supplies needed and DL attached.  Body weight today 235 pounds He and his wife report that he is doing very well,sleeping fine. CPAP auto 5-20/ APS Download-compliance 100%, AHI 5.9/hour Says his transplant kidney is functioning well.  03/06/19- 69 year old male former smoker followed for OSA, DOE complicated by ESRD/transplant, DM 2, GERD, CVA, chronic back pain, Pulmonary Hypertension ([place]), Covid pneumonia 2020,  f/u OSA CPAP auto 5-20/ APS Download compliance 100%, AHI 6.7/ hr Hosp this winter Covid Infection/ Pneumonia Dec, Hypoxic resp failure, AonC Kidney disease, GI bleed. Says breathing well now on room air without SOB, cough or wheeze. Says transplant doing well.  Pleased with CPAP. Had flu vax, no covid vax yet.  ROS-see HPI   + = positive Constitutional:    weight loss, night sweats, fevers, chills, fatigue, lassitude. HEENT:    headaches, difficulty swallowing, tooth/dental problems, sore throat,       sneezing, itching, ear ache, nasal congestion, post nasal drip, snoring CV:    chest pain, orthopnea, PND, swelling in lower extremities, anasarca,                                                dizziness, palpitations Resp:   shortness of breath with exertion or at rest.                productive cough,   non-productive cough, coughing  up of blood.              change in color of mucus.  wheezing.   Skin:    rash or lesions. GI:  No-   heartburn, indigestion, abdominal pain, nausea, vomiting, diarrhea,                 change in bowel habits, loss of appetite GU: dysuria, change in color of urine, no urgency or frequency.   flank pain. MS:   joint pain, stiffness, decreased range of motion, back pain. Neuro-     nothing unusual Psych:  change in mood or affect.  depression or anxiety.   memory loss.  OBJ- Physical Exam General- Alert, Oriented, Affect-appropriate, Distress- none acute, +overweight Skin- rash-none, lesions- none, excoriation- none Lymphadenopathy- none Head- atraumatic            Eyes- Gross vision intact, PERRLA, conjunctivae and secretions clear            Ears- Hearing, canals-normal            Nose- Clear, no-Septal dev, mucus, polyps, erosion, perforation             Throat- Mallampati IV , mucosa clear , drainage- none, tonsils- atrophic, + full dentures Neck- flexible , trachea midline, no stridor , thyroid nl, carotid no bruit Chest - symmetrical excursion , unlabored           Heart/CV- RRR , murmur+ 1-2/6  S Ao , no gallop  , no rub, nl s1 s2                           - JVD- none , edema- none, stasis changes- none, varices- none           Lung- clear to P&A, wheeze- none, cough- none , dullness-none, rub- none           Chest wall- + loop recorder Abd-  Br/ Gen/ Rectal- Not done, not indicated Extrem- cyanosis- none, clubbing, none, atrophy- none, strength- nl Neuro- grossly intact to observation

## 2019-03-07 DIAGNOSIS — Z79899 Other long term (current) drug therapy: Secondary | ICD-10-CM | POA: Diagnosis not present

## 2019-03-07 DIAGNOSIS — E0590 Thyrotoxicosis, unspecified without thyrotoxic crisis or storm: Secondary | ICD-10-CM | POA: Diagnosis not present

## 2019-03-10 DIAGNOSIS — G4733 Obstructive sleep apnea (adult) (pediatric): Secondary | ICD-10-CM | POA: Diagnosis not present

## 2019-03-10 DIAGNOSIS — E1165 Type 2 diabetes mellitus with hyperglycemia: Secondary | ICD-10-CM | POA: Diagnosis not present

## 2019-03-10 DIAGNOSIS — I10 Essential (primary) hypertension: Secondary | ICD-10-CM | POA: Diagnosis not present

## 2019-03-10 DIAGNOSIS — I259 Chronic ischemic heart disease, unspecified: Secondary | ICD-10-CM | POA: Diagnosis not present

## 2019-03-11 DIAGNOSIS — D500 Iron deficiency anemia secondary to blood loss (chronic): Secondary | ICD-10-CM | POA: Diagnosis not present

## 2019-03-12 DIAGNOSIS — D500 Iron deficiency anemia secondary to blood loss (chronic): Secondary | ICD-10-CM | POA: Diagnosis not present

## 2019-03-25 DIAGNOSIS — M961 Postlaminectomy syndrome, not elsewhere classified: Secondary | ICD-10-CM | POA: Diagnosis not present

## 2019-03-25 DIAGNOSIS — M5416 Radiculopathy, lumbar region: Secondary | ICD-10-CM | POA: Diagnosis not present

## 2019-04-09 DIAGNOSIS — G4733 Obstructive sleep apnea (adult) (pediatric): Secondary | ICD-10-CM | POA: Diagnosis not present

## 2019-04-15 DIAGNOSIS — D649 Anemia, unspecified: Secondary | ICD-10-CM | POA: Diagnosis not present

## 2019-04-23 DIAGNOSIS — Z7689 Persons encountering health services in other specified circumstances: Secondary | ICD-10-CM | POA: Diagnosis not present

## 2019-04-23 DIAGNOSIS — Z940 Kidney transplant status: Secondary | ICD-10-CM | POA: Diagnosis not present

## 2019-04-29 DIAGNOSIS — N189 Chronic kidney disease, unspecified: Secondary | ICD-10-CM | POA: Diagnosis not present

## 2019-04-29 DIAGNOSIS — D509 Iron deficiency anemia, unspecified: Secondary | ICD-10-CM | POA: Diagnosis not present

## 2019-04-29 DIAGNOSIS — D631 Anemia in chronic kidney disease: Secondary | ICD-10-CM | POA: Diagnosis not present

## 2019-05-06 DIAGNOSIS — E1129 Type 2 diabetes mellitus with other diabetic kidney complication: Secondary | ICD-10-CM | POA: Diagnosis not present

## 2019-05-06 DIAGNOSIS — N2581 Secondary hyperparathyroidism of renal origin: Secondary | ICD-10-CM | POA: Diagnosis not present

## 2019-05-06 DIAGNOSIS — R809 Proteinuria, unspecified: Secondary | ICD-10-CM | POA: Diagnosis not present

## 2019-05-06 DIAGNOSIS — Z940 Kidney transplant status: Secondary | ICD-10-CM | POA: Diagnosis not present

## 2019-05-06 DIAGNOSIS — I10 Essential (primary) hypertension: Secondary | ICD-10-CM | POA: Diagnosis not present

## 2019-05-07 DIAGNOSIS — G4733 Obstructive sleep apnea (adult) (pediatric): Secondary | ICD-10-CM | POA: Diagnosis not present

## 2019-05-10 DIAGNOSIS — E7800 Pure hypercholesterolemia, unspecified: Secondary | ICD-10-CM | POA: Diagnosis not present

## 2019-05-10 DIAGNOSIS — G4733 Obstructive sleep apnea (adult) (pediatric): Secondary | ICD-10-CM | POA: Diagnosis not present

## 2019-05-10 DIAGNOSIS — I10 Essential (primary) hypertension: Secondary | ICD-10-CM | POA: Diagnosis not present

## 2019-05-11 IMAGING — CR DG CHEST 2V
2 series · 2 of 2 positions shown · non-contrast
Comparison: 01/01/2017.

CLINICAL DATA: Chest pain, cough and shortness of breath.
Ex-smoker.

EXAM:
CHEST - 2 VIEW

[chest pa]
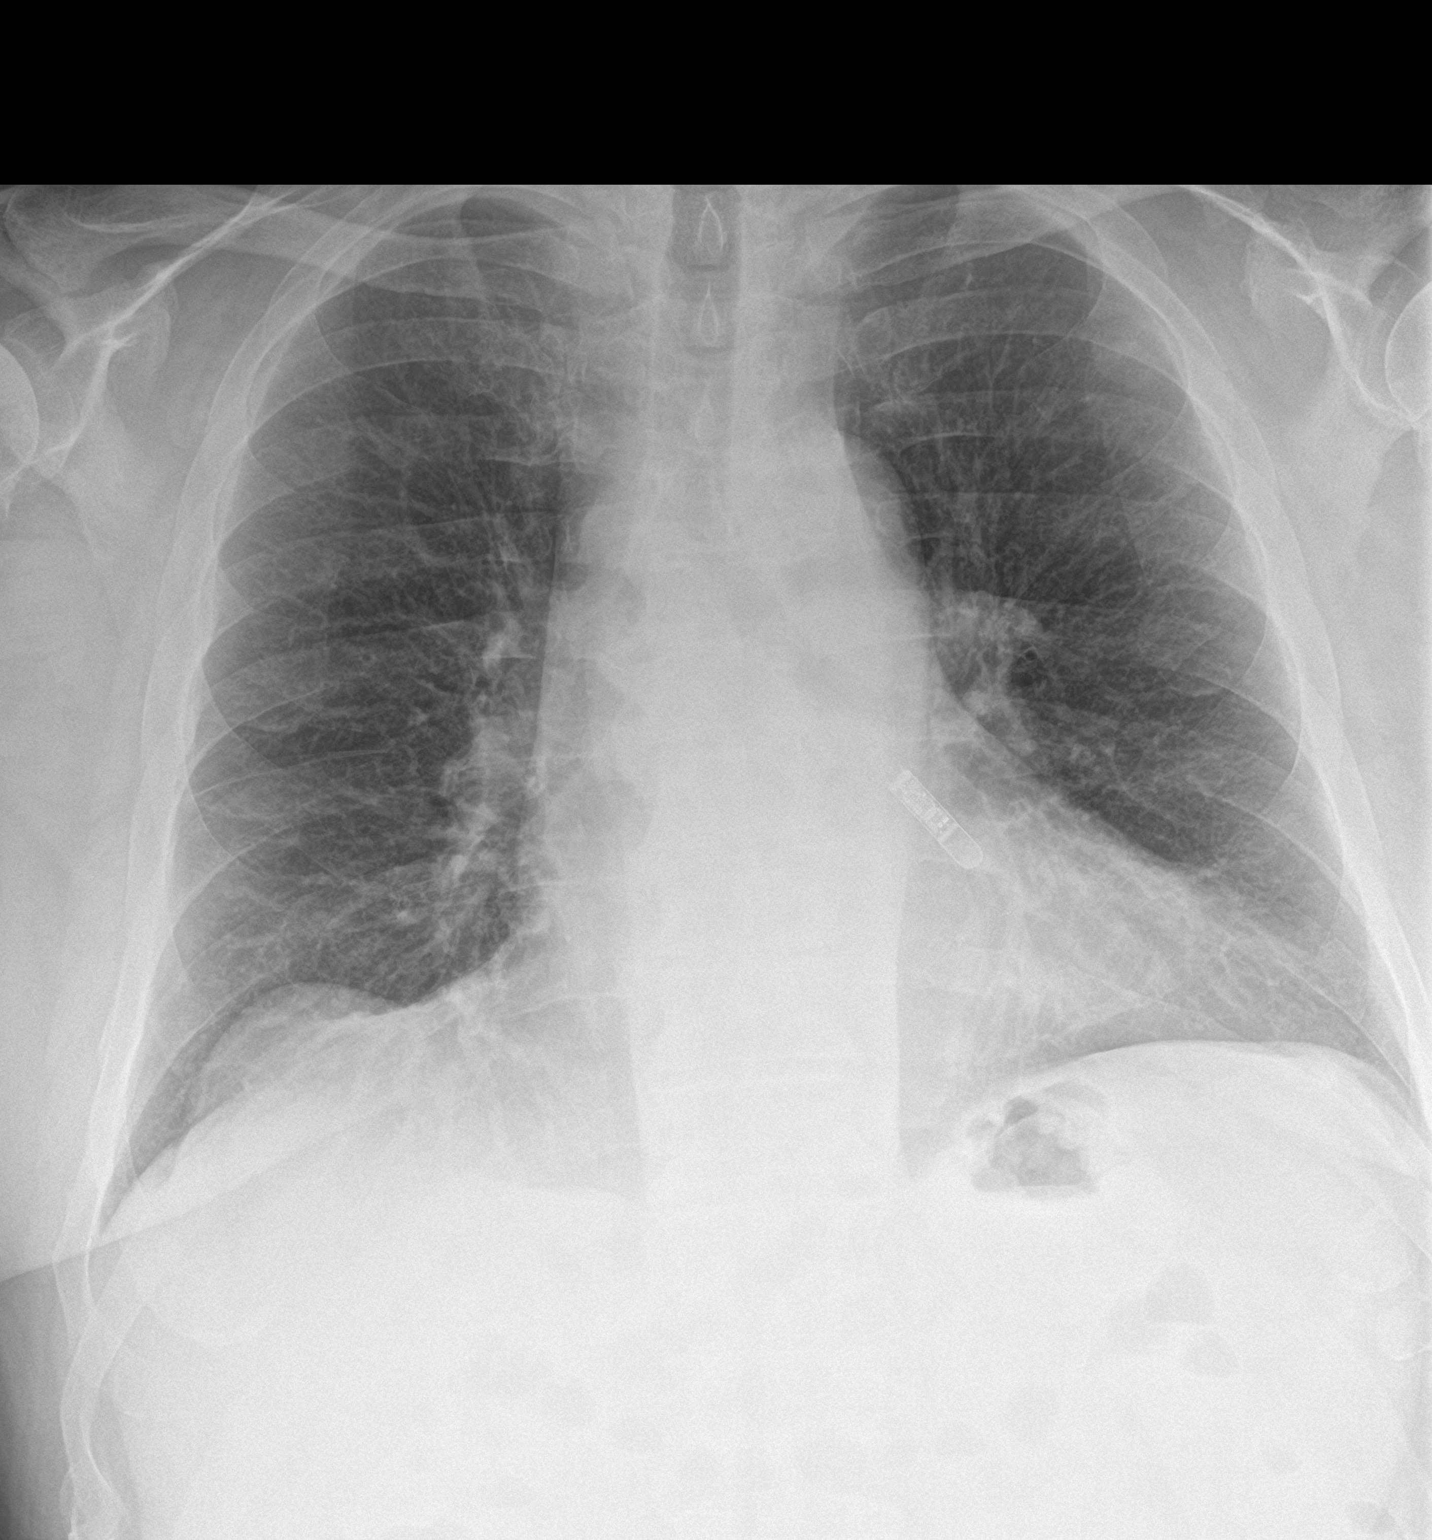

[chest lat]
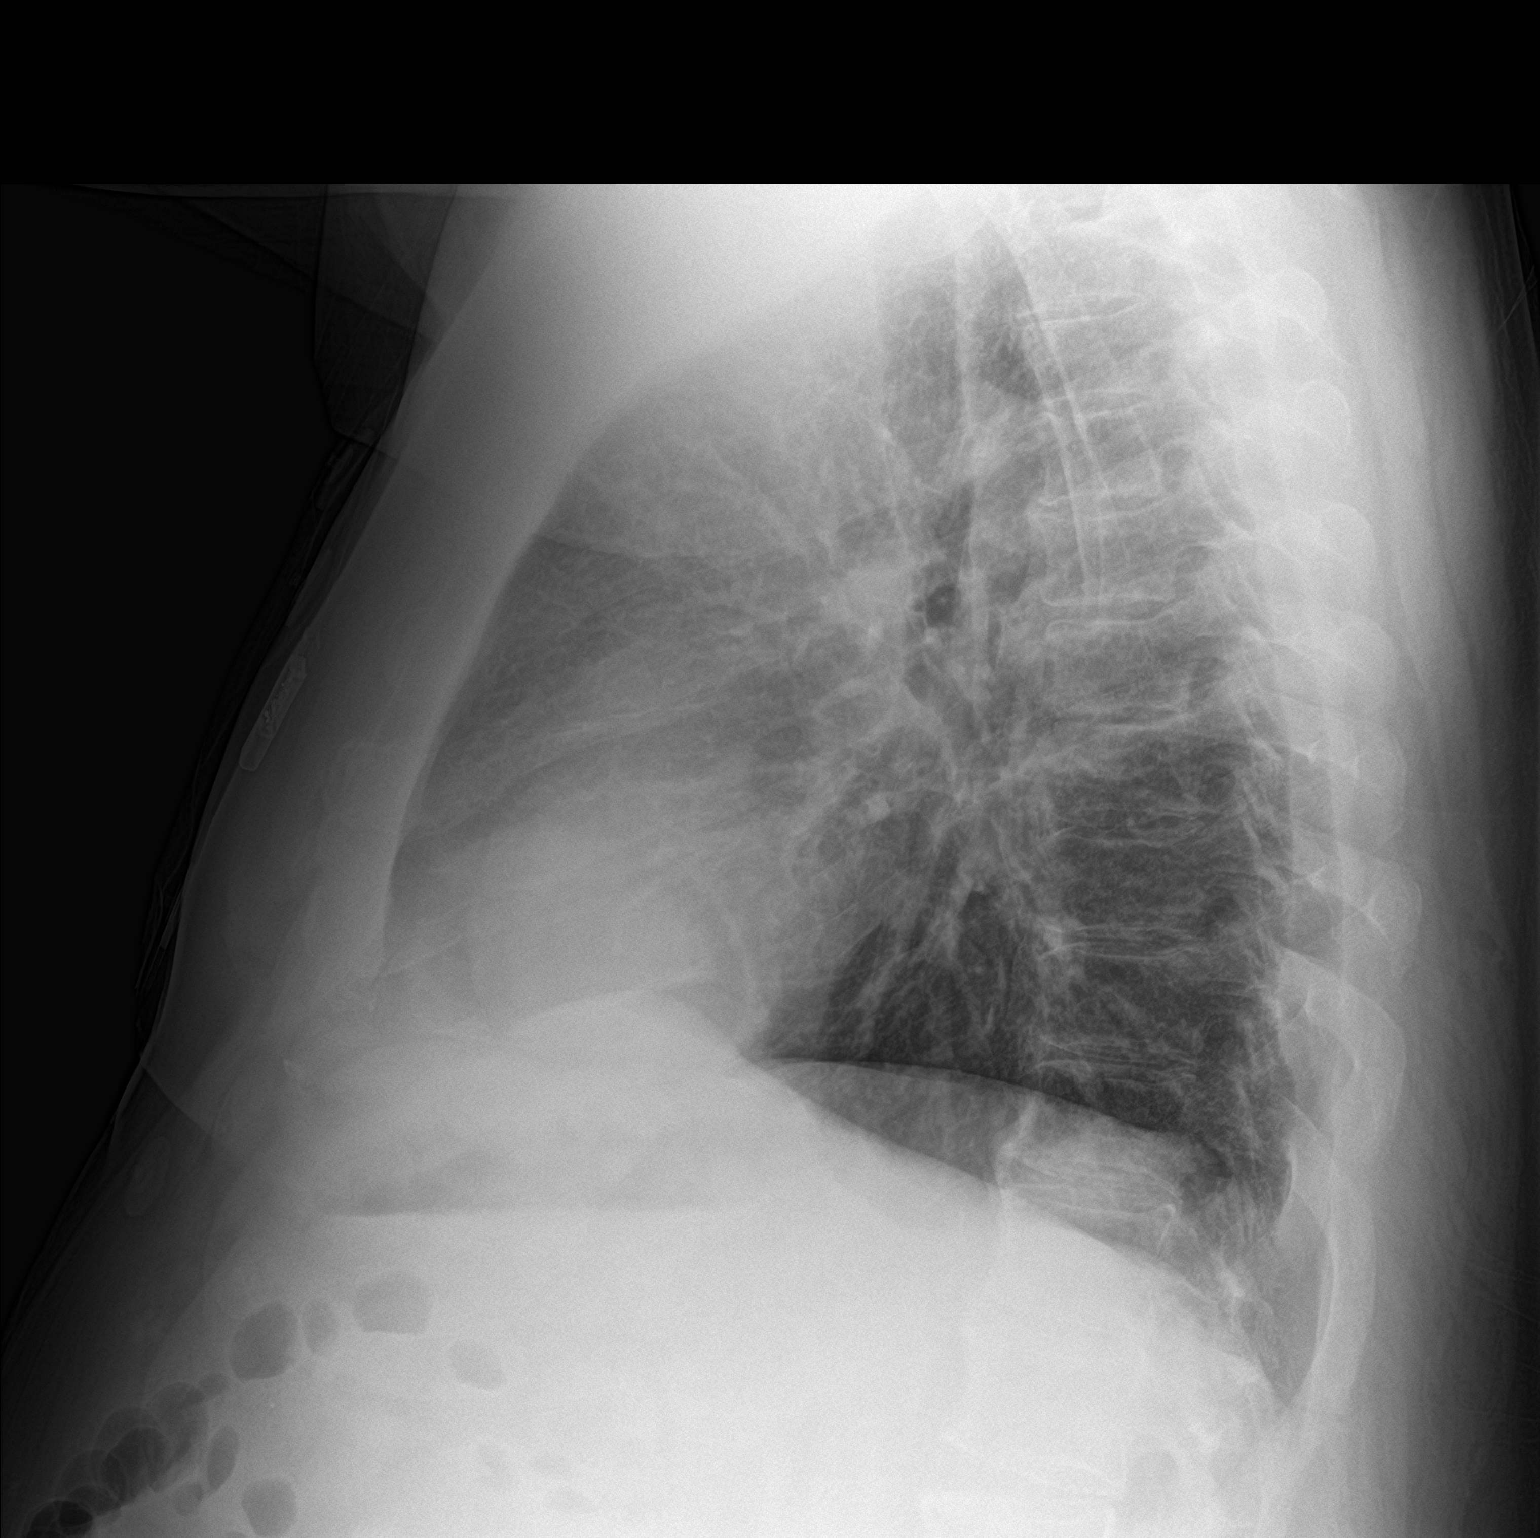

[2 of 2 positions shown; findings below may reference images not displayed]

FINDINGS: The cardiac silhouette remains borderline enlarged. A loop recorder
is again demonstrated overlying the upper heart on the left. Mild
diffuse peribronchial thickening and accentuation of the
interstitial markings is unchanged. The lungs remain mildly
hyperexpanded. Mild thoracic spine degenerative changes.
IMPRESSION: No acute abnormality. Stable mild changes of COPD and chronic
bronchitis.

## 2019-05-15 DIAGNOSIS — D500 Iron deficiency anemia secondary to blood loss (chronic): Secondary | ICD-10-CM | POA: Diagnosis not present

## 2019-05-24 DIAGNOSIS — E1165 Type 2 diabetes mellitus with hyperglycemia: Secondary | ICD-10-CM | POA: Diagnosis not present

## 2019-05-25 IMAGING — CR DG CHEST 2V
2 series · 2 of 2 positions shown · non-contrast
Comparison: 06/04/2017 chest radiograph.

CLINICAL DATA: Cough

EXAM:
CHEST - 2 VIEW

[chest lat]
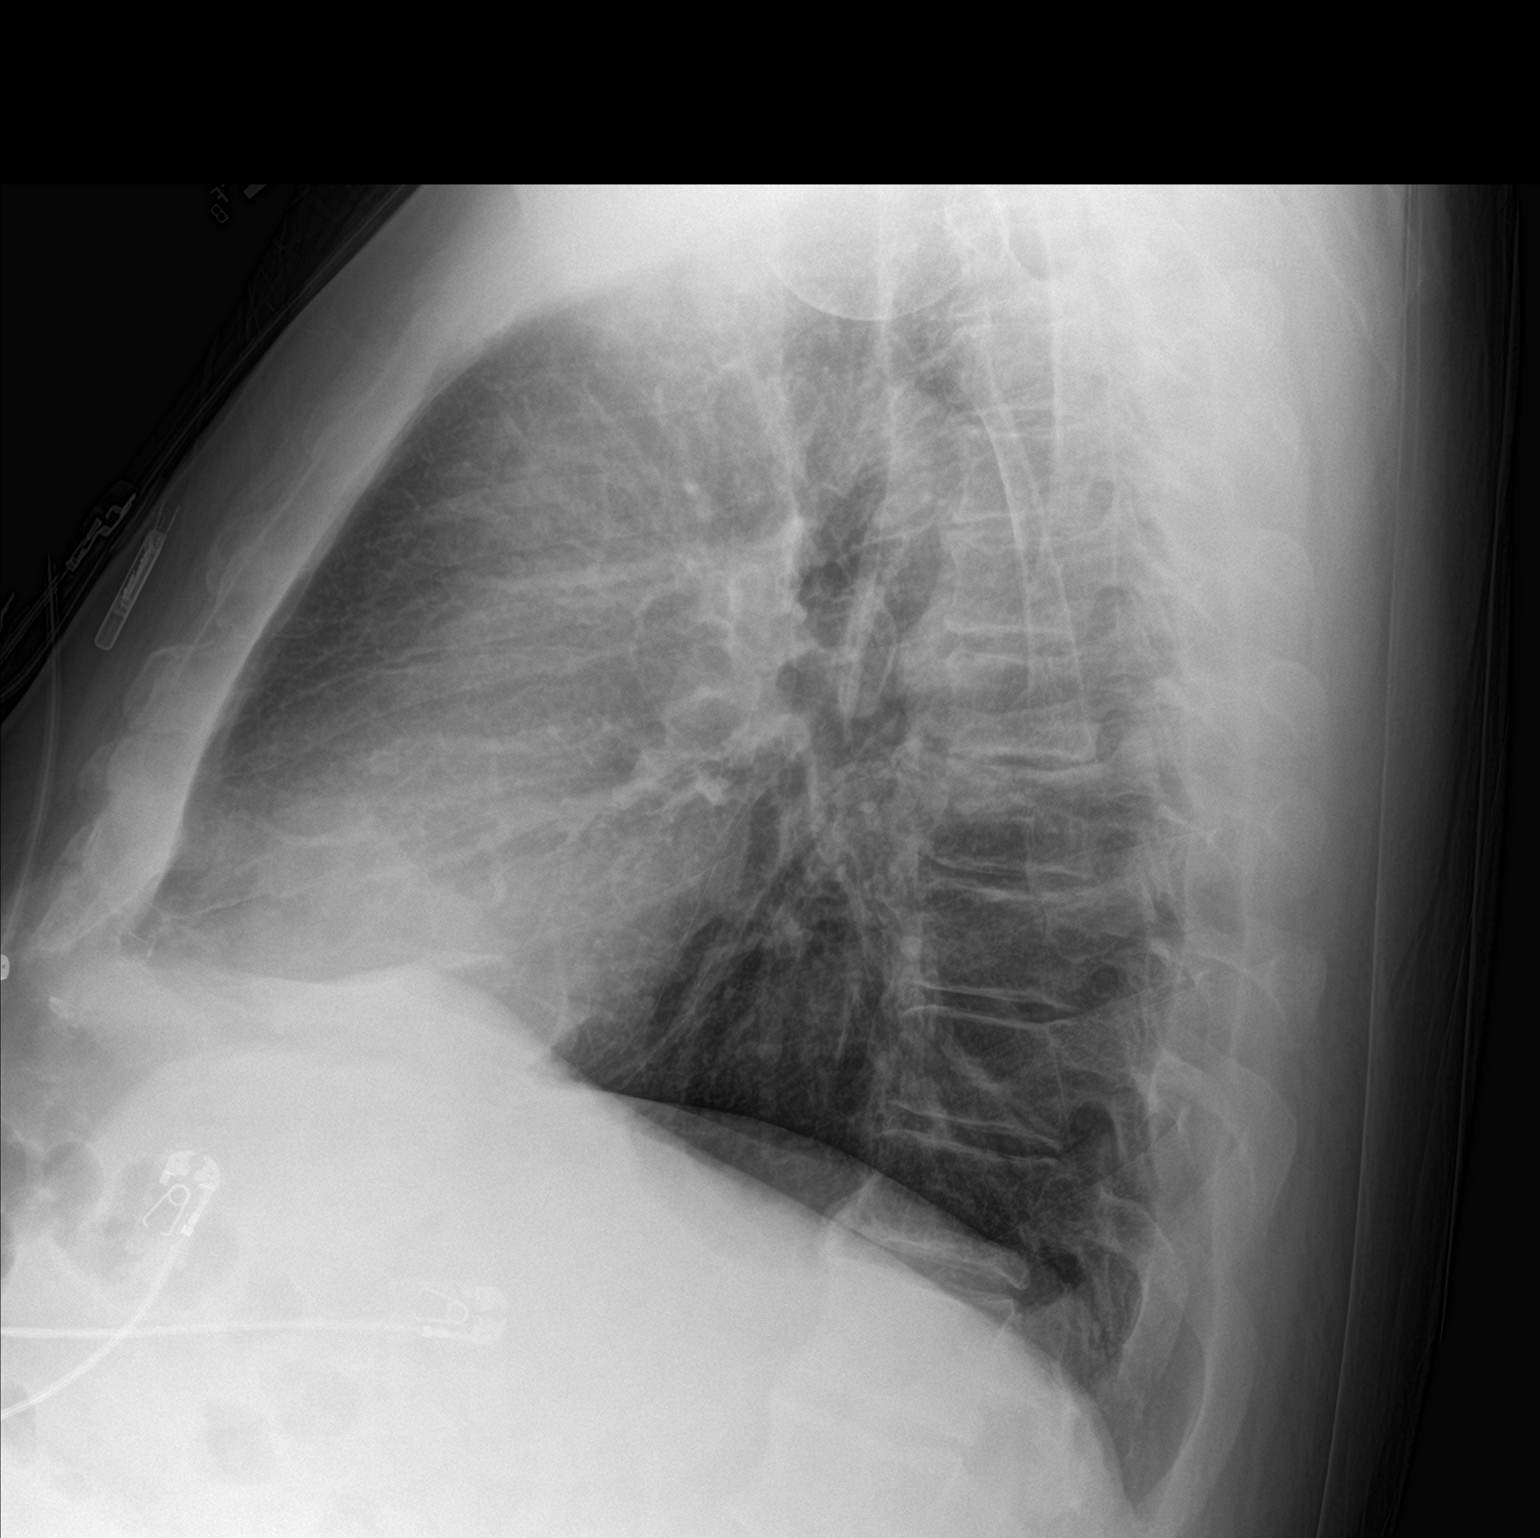

[chest ap]
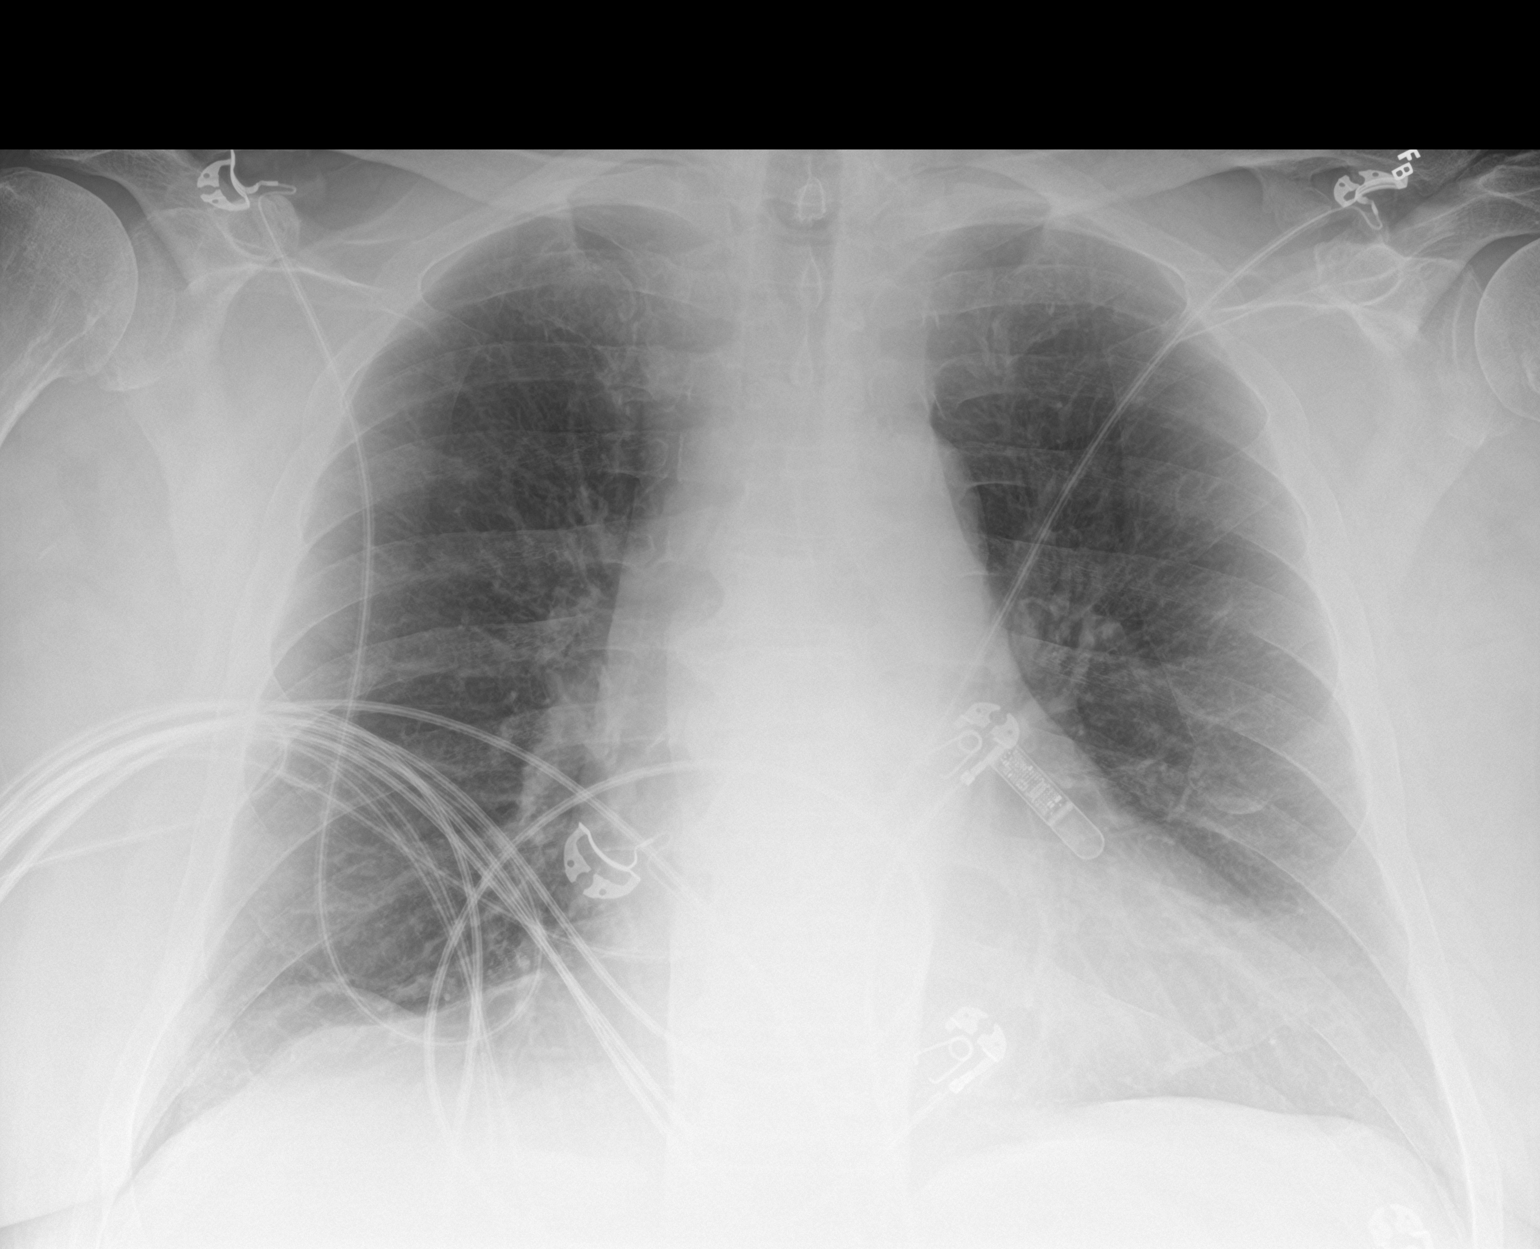

[2 of 2 positions shown; findings below may reference images not displayed]

FINDINGS: Loop recorder overlies the left heart. Stable cardiomediastinal
silhouette with top-normal heart size. No pneumothorax. No pleural
effusion. Mildly hyperinflated lungs. No pulmonary edema. No acute
consolidative airspace disease.
IMPRESSION: Mildly hyperinflated lungs, cannot exclude obstructive lung disease.
Otherwise no active cardiopulmonary disease.

## 2019-06-10 DIAGNOSIS — N189 Chronic kidney disease, unspecified: Secondary | ICD-10-CM | POA: Diagnosis not present

## 2019-06-10 DIAGNOSIS — E1122 Type 2 diabetes mellitus with diabetic chronic kidney disease: Secondary | ICD-10-CM | POA: Diagnosis not present

## 2019-06-10 DIAGNOSIS — I509 Heart failure, unspecified: Secondary | ICD-10-CM | POA: Diagnosis not present

## 2019-06-10 DIAGNOSIS — I130 Hypertensive heart and chronic kidney disease with heart failure and stage 1 through stage 4 chronic kidney disease, or unspecified chronic kidney disease: Secondary | ICD-10-CM | POA: Diagnosis not present

## 2019-06-20 DIAGNOSIS — M5416 Radiculopathy, lumbar region: Secondary | ICD-10-CM | POA: Diagnosis not present

## 2019-06-20 DIAGNOSIS — M961 Postlaminectomy syndrome, not elsewhere classified: Secondary | ICD-10-CM | POA: Diagnosis not present

## 2019-06-20 DIAGNOSIS — M47812 Spondylosis without myelopathy or radiculopathy, cervical region: Secondary | ICD-10-CM | POA: Diagnosis not present

## 2019-06-21 DIAGNOSIS — D510 Vitamin B12 deficiency anemia due to intrinsic factor deficiency: Secondary | ICD-10-CM | POA: Diagnosis not present

## 2019-06-21 DIAGNOSIS — E0590 Thyrotoxicosis, unspecified without thyrotoxic crisis or storm: Secondary | ICD-10-CM | POA: Diagnosis not present

## 2019-06-21 DIAGNOSIS — E1165 Type 2 diabetes mellitus with hyperglycemia: Secondary | ICD-10-CM | POA: Diagnosis not present

## 2019-07-02 IMAGING — CT CT NECK W/O CM
4 of 8 series · 10 of 33 positions shown, 11 images · non-contrast
Comparison: None.

CLINICAL DATA: Initial evaluation for difficulty swallowing,
unexplained dysphagia, throat pain. Evaluate for esophageal
obstruction, mass.

EXAM:
CT NECK WITHOUT CONTRAST
TECHNIQUE: Multidetector CT imaging of the neck was performed following the
standard protocol without intravenous contrast.

[Series 3: neck 2.0 st · axial · 0.51mm/px · z∈[-302,-216]mm · 2 of 130 slices shown, 3 images (1 of 4)]
[im 44/130  soft-tissue]
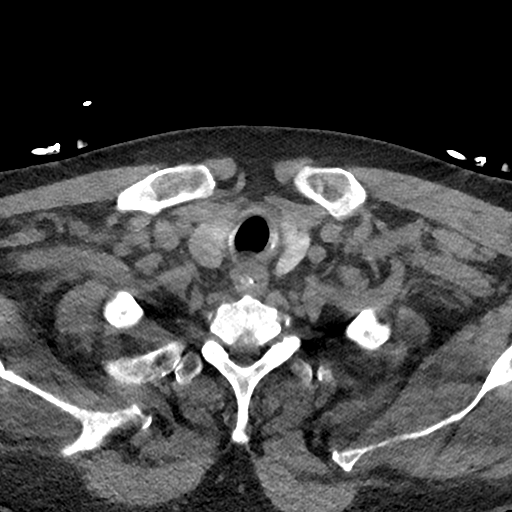
[im 44/130  bone]
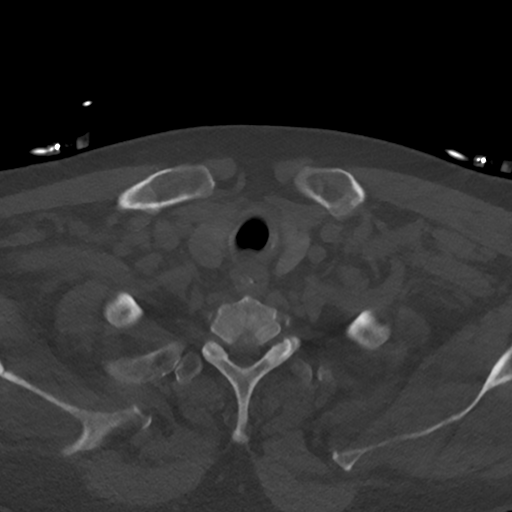
[im 87/130  bone]
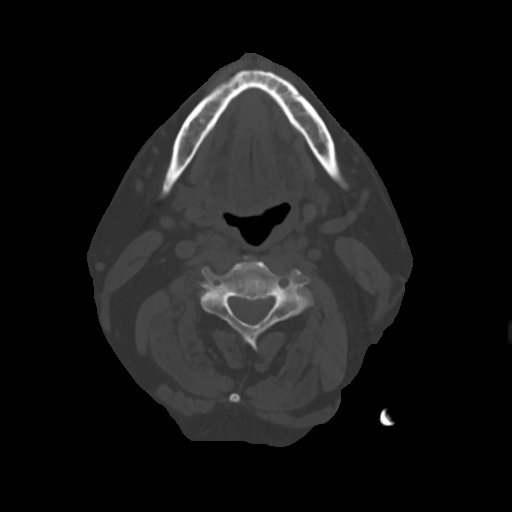

[Series 5: neck 2.0 st · sagittal · 0.51mm/px · 5 of 101 slices shown (2 of 4)]
[im 17/101  bone]
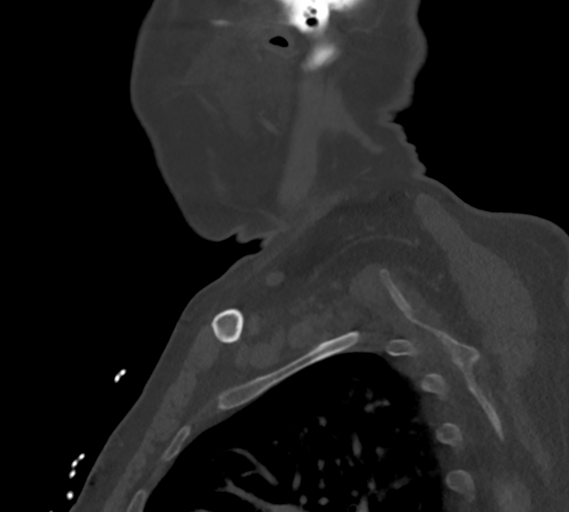
[im 34/101  bone]
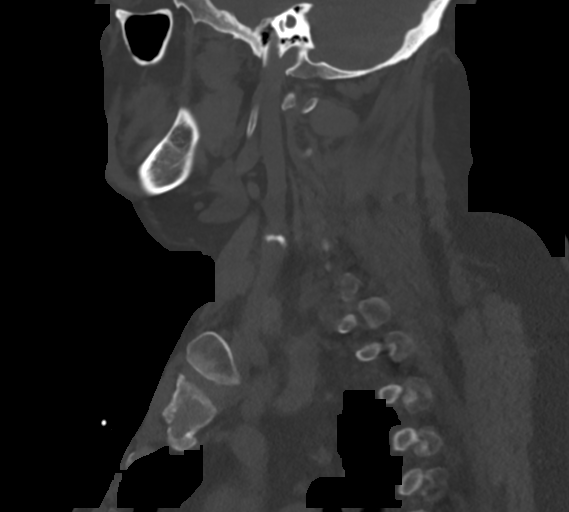
[im 51/101  bone]
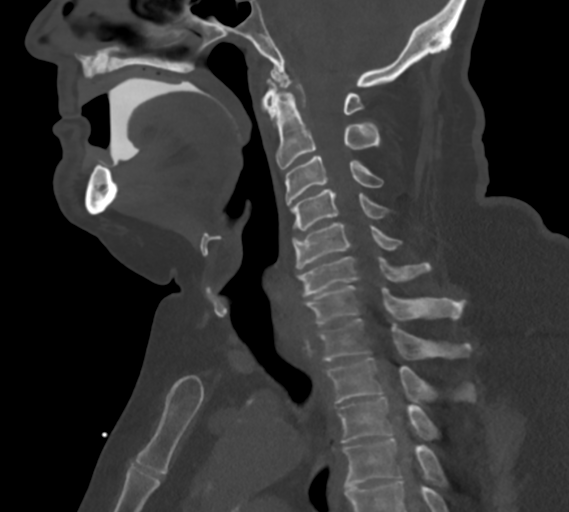
[im 67/101  bone]
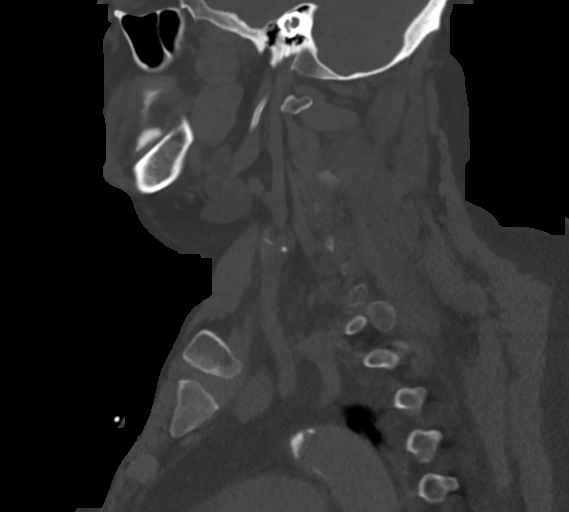
[im 84/101  bone]
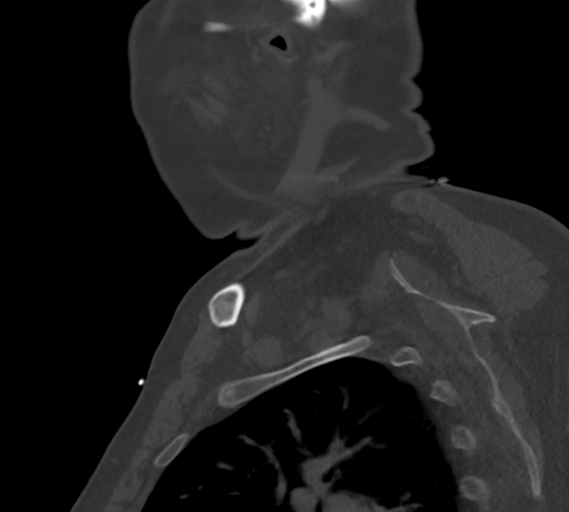

[Series 6: neck 2.0 st · coronal · 0.43mm/px · 1 of 115 slices shown (3 of 4)]
[im 58/115  bone]
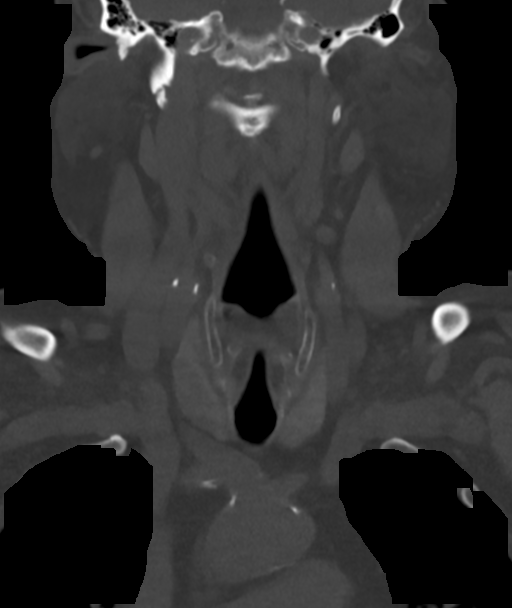

[Series 9: neck 2.0 st · axial · 0.51mm/px · z∈[-301,-215]mm · 2 of 130 slices shown (4 of 4)]
[im 44/130  bone]
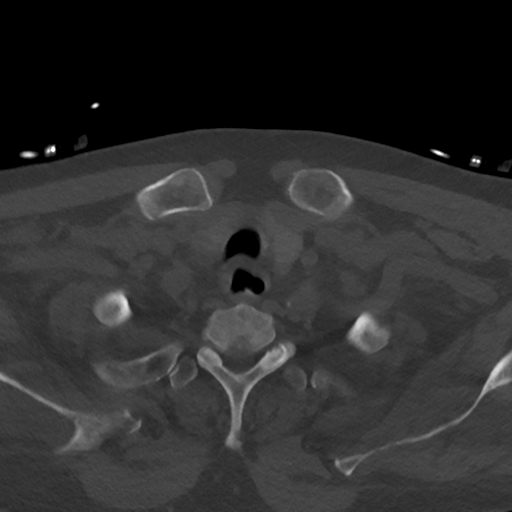
[im 87/130  bone]
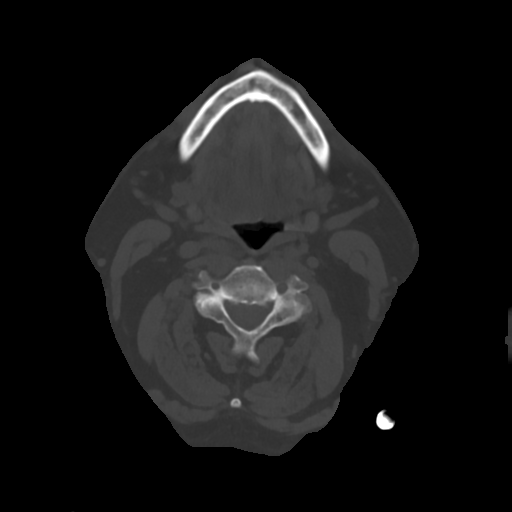

[10 of 33 positions shown; findings below may reference images not displayed]

FINDINGS: Pharynx and larynx: Oral contrast material present within the oral
cavity, limiting evaluation. No obvious mass lesion or other
abnormality. Patient is edentulous. No base of tongue lesion.
Oropharynx and nasopharynx within normal limits. Parapharyngeal fat
maintained. No retropharyngeal collection or mass. Epiglottis
normal. Vallecula clear. No abnormality at the piriform sinuses. Pre
epiglottic fat maintained. True cords symmetric and normal.
Subglottic airway clear.

Salivary glands: Salivary glands including the parotid and
submandibular glands are within normal limits.

Thyroid: 2.1 cm hypodense right thyroid nodule (series 3, image 85).
Subcentimeter nodule at the inferior left thyroid lobe noted, of
doubtful significance. Thyroid otherwise unremarkable.

Lymph nodes: No pathologically enlarged lymph nodes identified
within the neck.

Vascular: Prominent vascular calcifications about the carotid
bifurcations. ICAs partially medialized into the retropharyngeal
space bilaterally. Atheromatous change noted about the aortic arch
is well.

Limited intracranial: Scattered vascular calcifications noted within
the carotid siphons. Age-related cerebral and cerebellar atrophy
noted within the visualized brain.

Visualized orbits: Unremarkable.

Mastoids and visualized paranasal sinuses: Small retention cysts
noted within the left sphenoid sinus. Minimal layering opacity
within the left maxillary sinus. Visualized paranasal sinuses
otherwise clear. Mastoid air cells and middle ear cavities are
clear.

Skeleton: No acute osseus abnormality. No worrisome lytic or blastic
osseous lesions. Prominent bulky anterior osteophytic spurring seen
along the left anterolateral aspect of the cervical spine at the
level of C5-6 and C6-7.

Upper chest: Small amount of enteric contrast noted within the
visualized upper esophagus. Visualized upper mediastinum otherwise
unremarkable. Mild hazy atelectatic changes noted within the
visualized lungs. Visualized lungs are otherwise clear.

Other: None.
IMPRESSION: 1. Bulky anterior osteophytic spurring at C5-6 and C6-7, which can
sometimes be associated with dysphagia.
2. No other obstructive mass or other process identified within the
neck. Follow-up examination with fluoroscopic swallow study and
esophagram may be helpful for further evaluation as clinically
warranted.
3. Multinodular thyroid with dominant 2.1 cm right thyroid nodule,
better evaluated on prior thyroid ultrasound from 09/21/2015.
4. Moderate carotid bifurcation atherosclerosis.

## 2019-07-09 DIAGNOSIS — I129 Hypertensive chronic kidney disease with stage 1 through stage 4 chronic kidney disease, or unspecified chronic kidney disease: Secondary | ICD-10-CM | POA: Diagnosis not present

## 2019-07-09 DIAGNOSIS — Z940 Kidney transplant status: Secondary | ICD-10-CM | POA: Diagnosis not present

## 2019-07-10 DIAGNOSIS — I130 Hypertensive heart and chronic kidney disease with heart failure and stage 1 through stage 4 chronic kidney disease, or unspecified chronic kidney disease: Secondary | ICD-10-CM | POA: Diagnosis not present

## 2019-07-10 DIAGNOSIS — N189 Chronic kidney disease, unspecified: Secondary | ICD-10-CM | POA: Diagnosis not present

## 2019-07-10 DIAGNOSIS — E1122 Type 2 diabetes mellitus with diabetic chronic kidney disease: Secondary | ICD-10-CM | POA: Diagnosis not present

## 2019-07-10 DIAGNOSIS — I509 Heart failure, unspecified: Secondary | ICD-10-CM | POA: Diagnosis not present

## 2019-08-05 DIAGNOSIS — G4733 Obstructive sleep apnea (adult) (pediatric): Secondary | ICD-10-CM | POA: Diagnosis not present

## 2019-08-07 DIAGNOSIS — H353121 Nonexudative age-related macular degeneration, left eye, early dry stage: Secondary | ICD-10-CM | POA: Diagnosis not present

## 2019-08-07 DIAGNOSIS — H35443 Age-related reticular degeneration of retina, bilateral: Secondary | ICD-10-CM | POA: Diagnosis not present

## 2019-08-09 DIAGNOSIS — E1129 Type 2 diabetes mellitus with other diabetic kidney complication: Secondary | ICD-10-CM | POA: Diagnosis not present

## 2019-08-09 DIAGNOSIS — N2581 Secondary hyperparathyroidism of renal origin: Secondary | ICD-10-CM | POA: Diagnosis not present

## 2019-08-09 DIAGNOSIS — R809 Proteinuria, unspecified: Secondary | ICD-10-CM | POA: Diagnosis not present

## 2019-08-09 DIAGNOSIS — Z940 Kidney transplant status: Secondary | ICD-10-CM | POA: Diagnosis not present

## 2019-08-09 DIAGNOSIS — I10 Essential (primary) hypertension: Secondary | ICD-10-CM | POA: Diagnosis not present

## 2019-09-03 DIAGNOSIS — Z940 Kidney transplant status: Secondary | ICD-10-CM | POA: Diagnosis not present

## 2019-09-03 DIAGNOSIS — I129 Hypertensive chronic kidney disease with stage 1 through stage 4 chronic kidney disease, or unspecified chronic kidney disease: Secondary | ICD-10-CM | POA: Diagnosis not present

## 2019-09-04 ENCOUNTER — Telehealth: Payer: Self-pay | Admitting: Cardiology

## 2019-09-04 NOTE — Telephone Encounter (Signed)
Called spoke to patient's wife per dpr. She reports the patient has gained 4 lbs in three days. He is not short of breath and swelling in legs comes and goes. He is currently taking lasix 40 mg daily. Will consult with Dr. Agustin Cree to see if he wants to make any changes.

## 2019-09-04 NOTE — Telephone Encounter (Signed)
New Message   Pt c/o swelling: STAT is pt has developed SOB within 24 hours  1) How much weight have you gained and in what time span? 4 pounds three days  2) If swelling, where is the swelling located? Abdominal and feet   3) Are you currently taking a fluid pill? Yes, lasix also every other dayhe takes and extra dose of lasix  4) Are you currently SOB? No  5) Do you have a log of your daily weights (if so, list)? Monday 252, Wednesday 256  6) Have you gained 3 pounds in a day or 5 pounds in a week? 5lb in the past week  7) Have you traveled recently? No

## 2019-09-04 NOTE — Telephone Encounter (Signed)
I would not outcome for patency at.  Please let us know if his weight is still going up by  3 pounds

## 2019-09-04 NOTE — Telephone Encounter (Signed)
Left message for pateint to return call.

## 2019-09-04 NOTE — Telephone Encounter (Signed)
Patient is returning call.  °

## 2019-09-05 NOTE — Telephone Encounter (Signed)
Called patients wife per dpr. Advised her per Dr. Agustin Cree that we will not change anything unless he gains more weight, 3 lbs in one day. They will continue to monitor and let us know if things get worse.

## 2019-09-09 DIAGNOSIS — E1165 Type 2 diabetes mellitus with hyperglycemia: Secondary | ICD-10-CM | POA: Diagnosis not present

## 2019-09-10 DIAGNOSIS — E1122 Type 2 diabetes mellitus with diabetic chronic kidney disease: Secondary | ICD-10-CM | POA: Diagnosis not present

## 2019-09-10 DIAGNOSIS — N189 Chronic kidney disease, unspecified: Secondary | ICD-10-CM | POA: Diagnosis not present

## 2019-09-10 DIAGNOSIS — I130 Hypertensive heart and chronic kidney disease with heart failure and stage 1 through stage 4 chronic kidney disease, or unspecified chronic kidney disease: Secondary | ICD-10-CM | POA: Diagnosis not present

## 2019-09-10 DIAGNOSIS — I509 Heart failure, unspecified: Secondary | ICD-10-CM | POA: Diagnosis not present

## 2019-10-09 ENCOUNTER — Telehealth: Payer: Self-pay | Admitting: Cardiology

## 2019-10-09 NOTE — Telephone Encounter (Signed)
Jon [name] from [facility] called in stated she spoke to the pt and she had some concerns.  Pt Bp is [place] to178/67 today , his weight is up to 254 and he is drinking over 120 oz of fluid a day.    [phone] Ext [phone]

## 2019-10-09 NOTE — Telephone Encounter (Signed)
Left message for Jon [name] to return call.

## 2019-10-11 NOTE — Telephone Encounter (Signed)
Left message for Jon [name] to return call.

## 2019-10-15 NOTE — Telephone Encounter (Signed)
Left message for Jon [name] to return call.

## 2019-10-22 NOTE — Telephone Encounter (Signed)
Called patient since was never able to get in tough with [facility] nurse. He reports blood pressure was up but he is seeing pcp Dr. Lin Landsman and he is helping him with this now. He is on amlodipine from Dr. Lin Landsman. He reports blood pressure is better now. He will let us know if he needs anything further.

## 2019-10-23 DIAGNOSIS — N184 Chronic kidney disease, stage 4 (severe): Secondary | ICD-10-CM | POA: Diagnosis not present

## 2019-10-23 DIAGNOSIS — D631 Anemia in chronic kidney disease: Secondary | ICD-10-CM | POA: Diagnosis not present

## 2019-10-23 DIAGNOSIS — D509 Iron deficiency anemia, unspecified: Secondary | ICD-10-CM | POA: Diagnosis not present

## 2019-10-29 DIAGNOSIS — D509 Iron deficiency anemia, unspecified: Secondary | ICD-10-CM | POA: Diagnosis not present

## 2019-10-31 DIAGNOSIS — N2581 Secondary hyperparathyroidism of renal origin: Secondary | ICD-10-CM | POA: Diagnosis not present

## 2019-10-31 DIAGNOSIS — R809 Proteinuria, unspecified: Secondary | ICD-10-CM | POA: Diagnosis not present

## 2019-10-31 DIAGNOSIS — I10 Essential (primary) hypertension: Secondary | ICD-10-CM | POA: Diagnosis not present

## 2019-10-31 DIAGNOSIS — E1129 Type 2 diabetes mellitus with other diabetic kidney complication: Secondary | ICD-10-CM | POA: Diagnosis not present

## 2019-10-31 DIAGNOSIS — Z940 Kidney transplant status: Secondary | ICD-10-CM | POA: Diagnosis not present

## 2019-11-04 DIAGNOSIS — G4733 Obstructive sleep apnea (adult) (pediatric): Secondary | ICD-10-CM | POA: Diagnosis not present

## 2019-11-09 DIAGNOSIS — E1122 Type 2 diabetes mellitus with diabetic chronic kidney disease: Secondary | ICD-10-CM | POA: Diagnosis not present

## 2019-11-09 DIAGNOSIS — I130 Hypertensive heart and chronic kidney disease with heart failure and stage 1 through stage 4 chronic kidney disease, or unspecified chronic kidney disease: Secondary | ICD-10-CM | POA: Diagnosis not present

## 2019-11-09 DIAGNOSIS — I509 Heart failure, unspecified: Secondary | ICD-10-CM | POA: Diagnosis not present

## 2019-11-09 DIAGNOSIS — N189 Chronic kidney disease, unspecified: Secondary | ICD-10-CM | POA: Diagnosis not present

## 2019-12-10 DIAGNOSIS — E1122 Type 2 diabetes mellitus with diabetic chronic kidney disease: Secondary | ICD-10-CM | POA: Diagnosis not present

## 2019-12-10 DIAGNOSIS — N189 Chronic kidney disease, unspecified: Secondary | ICD-10-CM | POA: Diagnosis not present

## 2019-12-10 DIAGNOSIS — I509 Heart failure, unspecified: Secondary | ICD-10-CM | POA: Diagnosis not present

## 2019-12-10 DIAGNOSIS — I130 Hypertensive heart and chronic kidney disease with heart failure and stage 1 through stage 4 chronic kidney disease, or unspecified chronic kidney disease: Secondary | ICD-10-CM | POA: Diagnosis not present

## 2019-12-17 DIAGNOSIS — M5416 Radiculopathy, lumbar region: Secondary | ICD-10-CM | POA: Diagnosis not present

## 2019-12-17 DIAGNOSIS — M5441 Lumbago with sciatica, right side: Secondary | ICD-10-CM

## 2019-12-17 DIAGNOSIS — M5442 Lumbago with sciatica, left side: Secondary | ICD-10-CM | POA: Diagnosis not present

## 2019-12-17 DIAGNOSIS — G8929 Other chronic pain: Secondary | ICD-10-CM

## 2019-12-17 HISTORY — DX: Lumbago with sciatica, right side: M54.41

## 2019-12-17 HISTORY — DX: Other chronic pain: G89.29

## 2019-12-18 ENCOUNTER — Other Ambulatory Visit: Payer: Self-pay | Admitting: Student

## 2019-12-18 DIAGNOSIS — M5442 Lumbago with sciatica, left side: Secondary | ICD-10-CM | POA: Diagnosis not present

## 2019-12-18 DIAGNOSIS — M5416 Radiculopathy, lumbar region: Secondary | ICD-10-CM

## 2019-12-19 DIAGNOSIS — Z0000 Encounter for general adult medical examination without abnormal findings: Secondary | ICD-10-CM | POA: Diagnosis not present

## 2019-12-19 DIAGNOSIS — E1165 Type 2 diabetes mellitus with hyperglycemia: Secondary | ICD-10-CM | POA: Diagnosis not present

## 2020-01-08 ENCOUNTER — Ambulatory Visit
Admission: RE | Admit: 2020-01-08 | Discharge: 2020-01-08 | Disposition: A | Discharge status: Home / Self Care | Payer: Medicare Other | Source: Ambulatory Visit | Attending: Student | Admitting: Student

## 2020-01-08 ENCOUNTER — Other Ambulatory Visit: Payer: Self-pay

## 2020-01-08 DIAGNOSIS — M5416 Radiculopathy, lumbar region: Secondary | ICD-10-CM

## 2020-01-08 DIAGNOSIS — M48061 Spinal stenosis, lumbar region without neurogenic claudication: Secondary | ICD-10-CM | POA: Diagnosis not present

## 2020-01-08 DIAGNOSIS — M545 Low back pain, unspecified: Secondary | ICD-10-CM | POA: Diagnosis not present

## 2020-01-09 DIAGNOSIS — N189 Chronic kidney disease, unspecified: Secondary | ICD-10-CM | POA: Diagnosis not present

## 2020-01-09 DIAGNOSIS — I130 Hypertensive heart and chronic kidney disease with heart failure and stage 1 through stage 4 chronic kidney disease, or unspecified chronic kidney disease: Secondary | ICD-10-CM | POA: Diagnosis not present

## 2020-01-09 DIAGNOSIS — I509 Heart failure, unspecified: Secondary | ICD-10-CM | POA: Diagnosis not present

## 2020-01-09 DIAGNOSIS — E1122 Type 2 diabetes mellitus with diabetic chronic kidney disease: Secondary | ICD-10-CM | POA: Diagnosis not present

## 2020-01-16 DIAGNOSIS — M4316 Spondylolisthesis, lumbar region: Secondary | ICD-10-CM | POA: Diagnosis not present

## 2020-01-16 DIAGNOSIS — M4800 Spinal stenosis, site unspecified: Secondary | ICD-10-CM

## 2020-01-16 DIAGNOSIS — M5136 Other intervertebral disc degeneration, lumbar region: Secondary | ICD-10-CM | POA: Diagnosis not present

## 2020-01-16 DIAGNOSIS — Z6836 Body mass index (BMI) 36.0-36.9, adult: Secondary | ICD-10-CM

## 2020-01-16 DIAGNOSIS — I10 Essential (primary) hypertension: Secondary | ICD-10-CM | POA: Diagnosis not present

## 2020-01-16 DIAGNOSIS — M48062 Spinal stenosis, lumbar region with neurogenic claudication: Secondary | ICD-10-CM

## 2020-01-16 DIAGNOSIS — Z6834 Body mass index (BMI) 34.0-34.9, adult: Secondary | ICD-10-CM | POA: Insufficient documentation

## 2020-01-16 HISTORY — DX: Body mass index (BMI) 36.0-36.9, adult: Z68.36

## 2020-01-16 HISTORY — DX: Spinal stenosis, site unspecified: M48.00

## 2020-01-22 ENCOUNTER — Encounter: Payer: Self-pay | Admitting: Cardiology

## 2020-01-22 ENCOUNTER — Other Ambulatory Visit: Payer: Self-pay

## 2020-01-22 ENCOUNTER — Ambulatory Visit: Payer: Medicare Other | Admitting: Cardiology

## 2020-01-22 VITALS — BP 144/60 | HR 74 | Ht 71.0 in | Wt 257.0 lb

## 2020-01-22 DIAGNOSIS — I158 Other secondary hypertension: Secondary | ICD-10-CM | POA: Diagnosis not present

## 2020-01-22 DIAGNOSIS — Z949 Transplanted organ and tissue status, unspecified: Secondary | ICD-10-CM | POA: Diagnosis not present

## 2020-01-22 DIAGNOSIS — Z01818 Encounter for other preprocedural examination: Secondary | ICD-10-CM

## 2020-01-22 DIAGNOSIS — I272 Pulmonary hypertension, unspecified: Secondary | ICD-10-CM

## 2020-01-22 DIAGNOSIS — Z940 Kidney transplant status: Secondary | ICD-10-CM | POA: Diagnosis not present

## 2020-01-22 NOTE — Progress Notes (Signed)
Cardiology Office Note:    Date:  01/22/2020   ID:  Jon [name], DOB [birth year], MRN [id number]  PCP:  [name], MD  Cardiologist:  [name], MD    Referring MD: [name], MD   Chief Complaint  Patient presents with  . Medical Clearance  I need to have a laminectomy  History of Present Illness:    KIETH [name] is a 70 y.o. male with past medical history significant for renal transplant, ascending arctic aneurysm measuring 39 mm, essential hypertension, pulmonary hypertension, diabetes.  He was referred to [place] because it looks like he required laminectomy which probably will be done with general esthesia he would like to have evaluation before the surgery from cardiac standpoint reviewed.  Couple months ago he suffered from COVID-19 infection still having some shortness of breath some swelling of lower extremities.  Denies have any chest pain tightness squeezing pressure burning chest no palpitations no dizziness.  He tells me that he is can do things he can walk around he does have difficulty climbing stairs when he gets short of breath as well as pain in his now lower back.  He tells me that he have to hit his house with wood and he have to carry wood and have no difficulty doing it.  Past Medical History:  Diagnosis Date  . Abnormal gait   . Anemia of chronic disease   . Arthritis    back, hands   . Chronic back pain    radiculopathy and stenosis  . Chronic ischemic heart disease    Severe LVH  . Chronic kidney disease    [name], awaiting Transplant   . Congestive heart failure (CHF) ([place])   . CVA (cerebral infarction) 03/2013  . Diabetes mellitus     Lantus nightly ;type 2  . GERD (gastroesophageal reflux disease)   . H/O hiatal hernia   . History of colon polyps   . Hx of cardiovascular stress test    a.  Lexiscan Myoview (05/2013):  No ischemia, EF 53%; Normal Study  . Hyperlipidemia    takes Lovastatin daily  . Hypertension     takes Amlodipine and Catapres daily    dr Forrestine Him, Loop Recorder - [facility]  . Nocturia   . Obesity   . Peripheral edema    takes Lasix daily  . Sleep apnea    cpap , study in their home, 09/2102- [facility]           . Stroke (HCC)    speech, rt arm weakness  . Urinary frequency     Past Surgical History:  Procedure Laterality Date  . AV FISTULA PLACEMENT Left 04/03/2013   Procedure: ARTERIOVENOUS (AV) FISTULA CREATION- LEFT RADIOCEPHALIC VS BRACHIOCEPHALIC;  Surgeon: Conrad Bellamy, MD;  Location: [place];  Service: Vascular;  Laterality: Left;  . AV FISTULA PLACEMENT Left 05/14/2013   Procedure: LEFT BRACHIOCEPHALIC ARTERIOVENOUS (AV) FISTULA CREATION;  Surgeon: Conrad Bogue Chitto, MD;  Location: [place];  Service: Vascular;  Laterality: Left;  . BACK SURGERY  12/2012   2003- 1st back surgery & then 2014- fusion  . COLONOSCOPY    . ESOPHAGOGASTRODUODENOSCOPY    . ESOPHAGOGASTRODUODENOSCOPY (EGD) WITH PROPOFOL N/A 01/22/2019   Procedure: ESOPHAGOGASTRODUODENOSCOPY (EGD) WITH PROPOFOL;  Surgeon: [name], MD;  Location: [place];  Service: Gastroenterology;  Laterality: N/A;  . HEMORRHOID SURGERY    . HEMOSTASIS CLIP PLACEMENT  01/22/2019   Procedure: HEMOSTASIS CLIP PLACEMENT;  Surgeon:  Pyrtle, Lajuan Lines, MD;  Location: [facility] ENDOSCOPY;  Service: Gastroenterology;;  . KIDNEY TRANSPLANT  10/01/2013  . left knee surgery    . LOOP RECORDER IMPLANT  03/19/13   MDT LinQ implanted by Dr Rayann Heman for cryptogenic stroke  . LOOP RECORDER IMPLANT N/A 03/19/2013   Procedure: LOOP RECORDER IMPLANT;  Surgeon: Coralyn Mark, MD;  Location: [place] CATH LAB;  Service: Cardiovascular;  Laterality: N/A;  . NEPHRECTOMY TRANSPLANTED ORGAN    . NEUROPLASTY / TRANSPOSITION MEDIAN NERVE AT CARPAL TUNNEL BILATERAL    . right leg surgery     pin in place  . right wrist surgery    . SCLEROTHERAPY  01/22/2019   Procedure: Clide Deutscher;  Surgeon: [name], MD;  Location: [facility] ENDOSCOPY;  Service: Gastroenterology;;  .  TEE WITHOUT CARDIOVERSION N/A 03/19/2013   Procedure: TRANSESOPHAGEAL ECHOCARDIOGRAM (TEE);  Surgeon: [name], MD;  Location: [facility] ENDOSCOPY;  Service: Cardiovascular;  Laterality: N/A;  . THYROID SURGERY  10/13/2015   Performed at [facility] with surgical pathology revealed consistent with benign follicular nodule ([name] category II)    Current Medications: Current Meds  Medication Sig  . amLODipine (NORVASC) 10 MG tablet Take 10 mg by mouth daily.  [name] aspirin 81 MG EC tablet Take 81 mg by mouth daily. Swallow whole.  [name] atorvastatin (LIPITOR) 20 MG tablet Take 40 mg by mouth at bedtime.  [name] atorvastatin (LIPITOR) 40 MG tablet Take 40 mg by mouth at bedtime.  . calcitRIOL (ROCALTROL) 0.25 MCG capsule Take 0.25 mcg by mouth See admin instructions. Take 1 capsule (0.25 mcg) by mouth five times weekly - Monday thru Friday  . furosemide (LASIX) 40 MG tablet Take 1 tablet (40 mg total) by mouth daily.  [name] gabapentin (NEURONTIN) 300 MG capsule Take 300 mg by mouth 2 (two) times daily.  [name] HYDROcodone-acetaminophen (NORCO) 10-325 MG tablet Take 1 tablet by mouth every 6 (six) hours as needed.  . Insulin Glargine (BASAGLAR KWIKPEN) 100 UNIT/ML Inject 40 Units into the skin at bedtime.  . insulin lispro (HUMALOG KWIKPEN) 100 UNIT/ML KwikPen Inject 5-15 Units into the skin See admin instructions. Inject 5 units subcutaneously three times daily before meals adjusted per carb count  ( add 2 units for every 25 points)  . methimazole (TAPAZOLE) 5 MG tablet Take 2.5 mg by mouth daily.   . Multiple Vitamin (MULTIVITAMIN WITH MINERALS) TABS tablet Take 1 tablet by mouth daily.  . mycophenolate (MYFORTIC) 180 MG EC tablet Take 180 mg by mouth 2 (two) times daily.   . pantoprazole (PROTONIX) 40 MG tablet Take 1 tablet (40 mg total) by mouth 2 (two) times daily before a meal.  . predniSONE (DELTASONE) 5 MG tablet Take 1 tablet (5 mg total) by mouth daily with breakfast.  . sulfamethoxazole-trimethoprim (BACTRIM)  400-80 MG tablet Take on Monday, Wed, Friday's  . tacrolimus (PROGRAF) 1 MG capsule Take 2 mg by mouth 2 (two) times daily.  . tamsulosin (FLOMAX) 0.4 MG CAPS capsule Take 0.4 mg by mouth at bedtime.  . TRADJENTA 5 MG TABS tablet Take 5 mg by mouth daily.  . vitamin B-12 (CYANOCOBALAMIN) 1000 MCG tablet Take 1,000 mcg by mouth 2 (two) times daily.      Allergies:   Lisinopril, Meloxicam, and Victoza [liraglutide]   Social History   Socioeconomic History  . Marital status: Married    Spouse name: agnes  . Number of children: 5  . Years of education: 8th  . Highest education level: Not on file  Occupational History  . Occupation: disabled  Tobacco Use  . Smoking status: Former Smoker    Packs/day: 0.50    Years: 40.00    Pack years: 20.00    Types: Cigarettes    Quit date: 12/27/2012    Years since quitting: 7.0  . Smokeless tobacco: Never Used  Vaping Use  . Vaping Use: Never used  Substance and Sexual Activity  . Alcohol use: No    Alcohol/week: 0.0 standard drinks  . Drug use: No  . Sexual activity: Yes  Other Topics Concern  . Not on file  Social History Narrative   Patient lives at home with his wife Herbert Pun).  One story home with basement.   Patient is disabled.   Education 8th grade.   Right handed.   Caffeine One cup of coffee daily.   Retired Administrator.   5 children.   Social Determinants of Health   Financial Resource Strain: Not on file  Food Insecurity: No Food Insecurity  . Worried About Charity fundraiser in the Last Year: Never true  . Ran Out of Food in the Last Year: Never true  Transportation Needs: No Transportation Needs  . Lack of Transportation (Medical): No  . Lack of Transportation (Non-Medical): No  Physical Activity: Not on file  Stress: Not on file  Social Connections: Not on file     Family History: The patient's family history includes Diabetes in his brother; Heart disease in his father; Hyperlipidemia in his father and son;  Hypertension in his father and mother; Renal Disease in his father. ROS:   Please see the history of present illness.    All 14 point review of systems negative except as described per history of present illness  EKGs/Labs/Other Studies Reviewed:      Recent Labs: 01/26/2019: Platelets 108 01/27/2019: BUN 27; Creatinine, Ser 1.83; Hemoglobin 7.9; Potassium 4.5; Sodium 141  Recent Lipid Panel    Component Value Date/Time   CHOL 137 01/02/2017 0343   TRIG 113 01/04/2019 0011   HDL 38 (L) 01/02/2017 0343   CHOLHDL 3.6 01/02/2017 0343   VLDL 16 01/02/2017 0343   LDLCALC 83 01/02/2017 0343    Physical Exam:    VS:  BP (!) 144/60 (BP Location: Right Arm, Patient Position: Sitting)   Pulse 74   Ht 5\' 11"  (1.803 m)   Wt 257 lb (116.6 kg)   SpO2 92%   BMI 35.84 kg/m     Wt Readings from Last 3 Encounters:  01/22/20 257 lb (116.6 kg)  03/06/19 236 lb (107 kg)  02/12/19 230 lb (104.3 kg)     GEN:  Well nourished, well developed in no acute distress HEENT: Normal NECK: No JVD; No carotid bruits LYMPHATICS: No lymphadenopathy CARDIAC: RRR, systolic murmur grade 2/6 best heard right upper portion of the sternum, there is also holosystolic murmur best heard left border of the sternum 1-2 without radiation, no rubs, no gallops RESPIRATORY:  Clear to auscultation without rales, wheezing or rhonchi  ABDOMEN: Soft, non-tender, non-distended MUSCULOSKELETAL:  No edema; No deformity  SKIN: Warm and dry LOWER EXTREMITIES: no swelling NEUROLOGIC:  Alert and oriented x 3 PSYCHIATRIC:  Normal affect   ASSESSMENT:    1. Pulmonary hypertension, unspecified ([place])   2. Pre-operative clearance   3. Hypertension associated with transplantation   4. History of kidney transplant    PLAN:    In order of problems listed above:  1. Pulmonary hypertension.  Last echocardiogram did not  show that.  Echocardiogram will be repeated to recheck on that. 2. Preop cardiovascular evaluation.  I will  ask him to have an echocardiogram done I will check pulmonary pressure as well as left ventricle ejection fraction especially in view of the fact that he does have some dyspnea on exertion.  He may require stress testing however it looks like it is from physical ability point of view he is doing well. 3. Essential hypertension seems to be well controlled. 4. Dyslipidemia: On atorvastatin which I will continue.   Medication Adjustments/Labs and Tests Ordered: Current medicines are reviewed at length with the patient today.  Concerns regarding medicines are outlined above.  Orders Placed This Encounter  Procedures  . EKG 12-Lead  . ECHOCARDIOGRAM COMPLETE   Medication changes: No orders of the defined types were placed in this encounter.   Signed, Park Liter, MD, [facility] 01/22/2020 1:07 PM    [place]

## 2020-01-22 NOTE — Patient Instructions (Signed)
Medication Instructions:  Your physician recommends that you continue on your current medications as directed. Please refer to the Current Medication list given to you today.  *If you need a refill on your cardiac medications before your next appointment, please call your pharmacy*  Lab Work: None ordered today  Testing/Procedures: Your physician has requested that you have an echocardiogram. Echocardiography is a painless test that uses sound waves to create images of your heart. It provides your doctor with information about the size and shape of your heart and how well your heart's chambers and valves are working. This procedure takes approximately one hour. There are no restrictions for this procedure.  Follow-Up: At [facility], you and your health needs are our priority.  As part of our continuing mission to provide you with exceptional heart care, we have created designated Provider Care Teams.  These Care Teams include your primary Cardiologist (physician) and Advanced Practice Providers (APPs -  Physician Assistants and Nurse Practitioners) who all work together to provide you with the care you need, when you need it.  Your next appointment:   3 month(s)  The format for your next appointment:   In Person  Provider:   [name], MD

## 2020-01-23 NOTE — Progress Notes (Unsigned)
HPI  male former smoker followed for OSA, complicated by ESRD/Transplant, DM 2, GERD, CVA, chronic back pain Unattended Home Sleep Test 10/24/12-AHI 24/hour, desaturation to 81%. PFT 12/28/16-mild restriction, mild reduction of diffusion.  No response to bronchodilator.  FVC 3.28/70%, FEV1 2.64/75%, ratio 0.81, TLC 77%, DLCO 66%. ------------------------------------------------------------------------------- 03/06/19- 70 year old male former smoker followed for OSA, DOE complicated by ESRD/transplant, DM 2, GERD, CVA, chronic back pain, Pulmonary Hypertension ([place]), Covid pneumonia 2020,  f/u OSA CPAP auto 5-20/ APS Download compliance 100%, AHI 6.7/ hr Hosp this winter Covid Infection/ Pneumonia Dec, Hypoxic resp failure, AonC Kidney disease, GI bleed. Says breathing well now on room air without SOB, cough or wheeze. Says transplant doing well.  Pleased with CPAP. Had flu vax, no covid vax yet.  01/24/20-  70 year old male former smoker followed for OSA, DOE complicated by ESRD/transplant, DM 2, GERD, CVA, chronic back pain,HTN, Aortic Aneurysm,  Pulmonary Hypertension , Covid pneumonia 2020,  f/u OSA CPAP auto 5-20/ APS Download- compliance 97%, AHI 1.4/ hr Body weight today- 258 lbs Covid vax- none Flu vax-had Pending lumbar laminectomy Cardiology updating Echo to check on PHTN not noted on last Echo. ------Osa,denies problems Headgear is worn and stretching, but he has replacement ready. Full face mask and pressures are good. Machine about 69 years old. Pending back surgery- no date set. Heats with wood. Wife canned "1100 jars" this year- "we don't go hungry". I offered to answer any vaccine questions- he had none.  ROS-see HPI   + = positive Constitutional:    weight loss, night sweats, fevers, chills, fatigue, lassitude. HEENT:    headaches, difficulty swallowing, tooth/dental problems, sore throat,       sneezing, itching, ear ache, nasal congestion, post nasal drip,  snoring CV:    chest pain, orthopnea, PND, swelling in lower extremities, anasarca,                                                dizziness, palpitations Resp:   shortness of breath with exertion or at rest.                productive cough,   non-productive cough, coughing up of blood.              change in color of mucus.  wheezing.   Skin:    rash or lesions. GI:  No-   heartburn, indigestion, abdominal pain, nausea, vomiting, diarrhea,                 change in bowel habits, loss of appetite GU: dysuria, change in color of urine, no urgency or frequency.   flank pain. MS:   joint pain, stiffness, decreased range of motion, back pain. Neuro-     nothing unusual Psych:  change in mood or affect.  depression or anxiety.   memory loss.  OBJ- Physical Exam General- Alert, Oriented, Affect-appropriate, Distress- none acute, +overweight Skin-  Lymphadenopathy- none Head- atraumatic            Eyes- Gross vision intact, PERRLA, conjunctivae and secretions clear            Ears- Hearing, canals-normal            Nose- Clear, no-Septal dev, mucus, polyps, erosion, perforation             Throat- Mallampati IV , mucosa clear ,  drainage- none, tonsils- atrophic, + full dentures Neck- flexible , trachea midline, no stridor , thyroid nl, carotid no bruit Chest - symmetrical excursion , unlabored           Heart/CV- RRR , murmur+ 2/6 S Ao , no gallop  , no rub, nl s1 s2                           - JVD- none , edema- none, stasis changes- none, varices- none           Lung- clear to P&A, wheeze- none, cough- none , dullness-none, rub- none           Chest wall- + loop recorder Abd-  Br/ Gen/ Rectal- Not done, not indicated Extrem- cyanosis- none, clubbing, none, atrophy- none, strength- nl Neuro- grossly intact to observation

## 2020-01-24 ENCOUNTER — Ambulatory Visit (INDEPENDENT_AMBULATORY_CARE_PROVIDER_SITE_OTHER): Payer: Medicare Other | Admitting: Internal Medicine

## 2020-01-24 ENCOUNTER — Other Ambulatory Visit: Payer: Self-pay

## 2020-01-24 ENCOUNTER — Other Ambulatory Visit: Payer: Self-pay | Admitting: Neurosurgery

## 2020-01-24 ENCOUNTER — Encounter: Payer: Self-pay | Admitting: Internal Medicine

## 2020-01-24 ENCOUNTER — Telehealth: Payer: Self-pay | Admitting: *Deleted

## 2020-01-24 DIAGNOSIS — I5033 Acute on chronic diastolic (congestive) heart failure: Secondary | ICD-10-CM

## 2020-01-24 DIAGNOSIS — G4733 Obstructive sleep apnea (adult) (pediatric): Secondary | ICD-10-CM | POA: Diagnosis not present

## 2020-01-24 DIAGNOSIS — Z01818 Encounter for other preprocedural examination: Secondary | ICD-10-CM

## 2020-01-24 NOTE — Assessment & Plan Note (Signed)
Cardiology continues to follow with repeat echo pending.

## 2020-01-24 NOTE — Assessment & Plan Note (Signed)
Benefits from CPAP with good compliance and control Plan- continue CPAP auto 5-20 

## 2020-01-24 NOTE — Patient Instructions (Signed)
We can continue CPAP auto 5-20  Good luck with your back surgery- hope it goes well !  Please call if we can help

## 2020-01-24 NOTE — Telephone Encounter (Signed)
    [facility] Pre-operative Risk Assessment    Request for surgical clearance:  1. What type of surgery is being performed? Lumbar Fusion  2. When is this surgery scheduled? 02/26/2020   3. What type of clearance is required (medical clearance vs. Pharmacy clearance to hold med vs. Both)? Both  4. Are there any medications that need to be held prior to surgery and how long? NONE  5. Practice name and name of physician performing surgery? [facility], Dr. Newman Pies  6. What is your office phone number [phone], ext [phone]    7.   What is your office fax number [phone]  8.   Anesthesia type (None, local, MAC, general) ? General   Jon [name] 01/24/2020, 3:04 PM  _________________________________________________________________   (provider comments below)

## 2020-01-27 NOTE — Telephone Encounter (Signed)
   Primary Cardiologist: [name], MD  Chart reviewed as part of pre-operative protocol coverage. Mr.  Jon [name] has a hx of pulmonary hypertension, kidney transplant, HTN, HLD.   He was seen by Dr. Agustin Cree 01/22/2020 as part of preoperative evaluation. He was recommended for echocardiogram which is scheduled 02/18/2020. This will need to be completed prior to clearance.  Will route a note to Dr. Agustin Cree regarding holding Aspirin.   Will route to requesting party via [facility] fax function so they are aware.   Loel Dubonnet, NP  01/27/2020, 8:25 AM

## 2020-01-28 DIAGNOSIS — N184 Chronic kidney disease, stage 4 (severe): Secondary | ICD-10-CM | POA: Diagnosis not present

## 2020-01-31 NOTE — Telephone Encounter (Signed)
It looks like echocardiogram is scheduled the beginning of February, in terms of aspirin ES will be able to hold aspirin for 5 days

## 2020-02-07 DIAGNOSIS — B348 Other viral infections of unspecified site: Secondary | ICD-10-CM | POA: Diagnosis not present

## 2020-02-07 DIAGNOSIS — Z940 Kidney transplant status: Secondary | ICD-10-CM | POA: Diagnosis not present

## 2020-02-07 DIAGNOSIS — N2581 Secondary hyperparathyroidism of renal origin: Secondary | ICD-10-CM | POA: Diagnosis not present

## 2020-02-07 DIAGNOSIS — I10 Essential (primary) hypertension: Secondary | ICD-10-CM | POA: Diagnosis not present

## 2020-02-07 DIAGNOSIS — K922 Gastrointestinal hemorrhage, unspecified: Secondary | ICD-10-CM | POA: Diagnosis not present

## 2020-02-07 DIAGNOSIS — E1129 Type 2 diabetes mellitus with other diabetic kidney complication: Secondary | ICD-10-CM | POA: Diagnosis not present

## 2020-02-07 DIAGNOSIS — R634 Abnormal weight loss: Secondary | ICD-10-CM | POA: Diagnosis not present

## 2020-02-07 DIAGNOSIS — R809 Proteinuria, unspecified: Secondary | ICD-10-CM | POA: Diagnosis not present

## 2020-02-09 DIAGNOSIS — E1165 Type 2 diabetes mellitus with hyperglycemia: Secondary | ICD-10-CM | POA: Diagnosis not present

## 2020-02-09 DIAGNOSIS — I10 Essential (primary) hypertension: Secondary | ICD-10-CM | POA: Diagnosis not present

## 2020-02-09 DIAGNOSIS — D519 Vitamin B12 deficiency anemia, unspecified: Secondary | ICD-10-CM | POA: Diagnosis not present

## 2020-02-17 ENCOUNTER — Other Ambulatory Visit: Payer: Self-pay | Admitting: Neurosurgery

## 2020-02-17 DIAGNOSIS — G4733 Obstructive sleep apnea (adult) (pediatric): Secondary | ICD-10-CM | POA: Diagnosis not present

## 2020-02-18 ENCOUNTER — Ambulatory Visit (INDEPENDENT_AMBULATORY_CARE_PROVIDER_SITE_OTHER): Payer: Medicare Other

## 2020-02-18 ENCOUNTER — Other Ambulatory Visit: Payer: Self-pay

## 2020-02-18 DIAGNOSIS — I272 Pulmonary hypertension, unspecified: Secondary | ICD-10-CM

## 2020-02-18 LAB — ECHOCARDIOGRAM COMPLETE
Area-P 1/2: 2.26 cm2
MV VTI: 1.88 cm2
S' Lateral: 3 cm

## 2020-02-18 NOTE — Progress Notes (Signed)
Complete echocardiogram performed.  Jimmy Amos Micheals RDCS, RVT  

## 2020-02-19 NOTE — Telephone Encounter (Signed)
Dr Agustin Cree the echo report is in the chart- OK for surgery ?  Please respond to CV DIV PRE OP  Nikeria Kalman PA-C 02/19/2020 2:00 PM

## 2020-02-20 NOTE — Telephone Encounter (Signed)
   Primary Cardiologist: [name], MD  Chart reviewed as part of pre-operative protocol coverage today. Patient was recently seen by Dr. Agustin Cree 01/22/20 for pre-operative evaluation and further testing was planned.  Per protocol, since patient was evaluated in the office specifically for pre-op clearance, no further separate input from our APP team is needed. Will route message to Dr. Agustin Cree to review his testing and provide finalized recommendations to requesting party below regarding aspirin and clearance. Will remove from preop box.  Charlie Pitter, PA-C 02/20/2020, 8:46 AM

## 2020-02-24 ENCOUNTER — Other Ambulatory Visit (HOSPITAL_COMMUNITY): Payer: Medicare Other

## 2020-02-24 ENCOUNTER — Inpatient Hospital Stay (HOSPITAL_COMMUNITY)
Admission: RE | Admit: 2020-02-24 | Discharge: 2020-02-24 | Disposition: A | Discharge status: Home / Self Care | Payer: Medicare Other | Source: Ambulatory Visit

## 2020-02-24 NOTE — Telephone Encounter (Signed)
He needs to have Lexiscan done to rule out significant ischemia

## 2020-02-24 NOTE — Telephone Encounter (Signed)
Patient wife informed of [place] appointment.

## 2020-02-24 NOTE — Progress Notes (Signed)
Surgical Instructions    Your procedure is scheduled on Wednesday, February 16th .  Report to [facility] Main Entrance "A" at 6:30 A.M., then check in with the Admitting office.  Call this number if you have problems the morning of surgery:  [phone]   If you have any questions prior to your surgery date call [phone]: Open Monday-Friday 8am-4pm    Remember:  Do not eat or drink after midnight the night before your surgery     Take these medicines the morning of surgery with A SIP OF WATER   Amlodipine (Norvasc)  Gabapentin (Neurontin)  Hydrocodone-Acetaminophen - if needed  Methimazole (Tapazole)  Mycophenolate (Myfortic)  Pantoprazole (Protonix)  Prednisone  Tacrolimus (Prograf)  Bactrim   Follow your surgeon's instructions on when to stop Aspirin.  If no instructions were given by your surgeon then you will need to call the office to get those instructions.    As of today, STOP taking Aleve, Naproxen, Ibuprofen, Motrin, Advil, Goody's, BC's, all herbal medications, fish oil, and all vitamins.   WHAT DO I DO ABOUT MY DIABETES MEDICATION?   [name] Do not take oral diabetes medicines (pills) the morning of surgery. - Tradjenta  . THE NIGHT BEFORE SURGERY, take half of your normal dose (20 units) of Insulin Glargine (Basaglar).  Do not take bedtime dose of Insulin Lispro (Humalog).         . THE MORNING OF SURGERY, if your CBG is greater than 220 mg/dL, you may take  of your sliding scale (correction) dose of insulin - Insulin Lispro   HOW TO MANAGE YOUR DIABETES BEFORE AND AFTER SURGERY  Why is it important to control my blood sugar before and after surgery? . Improving blood sugar levels before and after surgery helps healing and can limit problems. . A way of improving blood sugar control is eating a healthy diet by: o  Eating less sugar and carbohydrates o  Increasing activity/exercise o  Talking with your doctor about reaching your blood sugar goals . High  blood sugars (greater than 180 mg/dL) can raise your risk of infections and slow your recovery, so you will need to focus on controlling your diabetes during the weeks before surgery. . Make sure that the doctor who takes care of your diabetes knows about your planned surgery including the date and location.  How do I manage my blood sugar before surgery? . Check your blood sugar at least 4 times a day, starting 2 days before surgery, to make sure that the level is not too high or low. . Check your blood sugar the morning of your surgery when you wake up and every 2 hours until you get to the Short Stay unit. o If your blood sugar is less than 70 mg/dL, you will need to treat for low blood sugar: - Do not take insulin. - Treat a low blood sugar (less than 70 mg/dL) with  cup of clear juice (cranberry or apple), 4 glucose tablets, OR glucose gel. - Recheck blood sugar in 15 minutes after treatment (to make sure it is greater than 70 mg/dL). If your blood sugar is not greater than 70 mg/dL on recheck, call [phone] for further instructions. . Report your blood sugar to the short stay nurse when you get to Short Stay.  . If you are admitted to the hospital after surgery: o Your blood sugar will be checked by the staff and you will probably be given insulin after surgery (instead of oral diabetes  medicines) to make sure you have good blood sugar levels. o The goal for blood sugar control after surgery is 80-180 mg/dL.                      DAY OF SURGERY:            Do not wear jewelry            Do not wear lotions, powders, colognes, or deodorant.            Men may shave face and neck.            Do not bring valuables to the hospital.            [facility] is not responsible for any belongings or valuables.  Do NOT Smoke (Tobacco/Vaping) or drink Alcohol 24 hours prior to your procedure If you use a CPAP at night, you may bring all equipment for your overnight stay.   Contacts,  glasses, dentures or bridgework may not be worn into surgery, please bring cases for these belongings   For patients admitted to the hospital, discharge time will be determined by your treatment team.   Patients discharged the day of surgery will not be allowed to drive home, and someone needs to stay with them for 24 hours.    Special instructions:   [place] [facility]- Preparing For Surgery  Before surgery, you can play an important role. Because skin is not sterile, your skin needs to be as free of germs as possible. You can reduce the number of germs on your skin by washing with CHG (chlorahexidine gluconate) Soap before surgery.  CHG is an antiseptic cleaner which kills germs and bonds with the skin to continue killing germs even after washing.    Oral Hygiene is also important to reduce your risk of infection.  Remember - BRUSH YOUR TEETH THE MORNING OF SURGERY WITH YOUR REGULAR TOOTHPASTE  Please do not use if you have an allergy to CHG or antibacterial soaps. If your skin becomes reddened/irritated stop using the CHG.  Do not shave (including legs and underarms) for at least 48 hours prior to first CHG shower. It is OK to shave your face.  Please follow these instructions carefully.   1. Shower the NIGHT BEFORE SURGERY and the MORNING OF SURGERY  2. If you chose to wash your hair, wash your hair first as usual with your normal shampoo.  3. After you shampoo, rinse your hair and body thoroughly to remove the shampoo.  4. Wash Face and genitals (private parts) with your normal soap.   5.  Shower the NIGHT BEFORE SURGERY and the MORNING OF SURGERY with CHG Soap.   6. Use CHG Soap as you would any other liquid soap. You can apply CHG directly to the skin and wash gently with a scrungie or a clean washcloth.   7. Apply the CHG Soap to your body ONLY FROM THE NECK DOWN.  Do not use on open wounds or open sores. Avoid contact with your eyes, ears, mouth and genitals (private parts). Wash  Face and genitals (private parts)  with your normal soap.   8. Wash thoroughly, paying special attention to the area where your surgery will be performed.  9. Thoroughly rinse your body with warm water from the neck down.  10. DO NOT shower/wash with your normal soap after using and rinsing off the CHG Soap.  11. Pat yourself dry with a CLEAN TOWEL.  12. Wear CLEAN PAJAMAS to bed the night before surgery  13. Place CLEAN SHEETS on your bed the night before your surgery  14. DO NOT SLEEP WITH PETS.   Day of Surgery: Wear Clean/Comfortable clothing the morning of surgery Do not apply any deodorants/lotions.   Remember to brush your teeth WITH YOUR REGULAR TOOTHPASTE.   Please read over the following fact sheets that you were given.

## 2020-02-24 NOTE — Telephone Encounter (Signed)
Wife aware patients needs lexiscan for surgery. Scheduler messaged to schedule.

## 2020-02-24 NOTE — Addendum Note (Signed)
Addended by: Senaida Ores on: 02/24/2020 10:40 AM   Modules accepted: Orders

## 2020-02-26 ENCOUNTER — Inpatient Hospital Stay (HOSPITAL_COMMUNITY): Admission: RE | Admit: 2020-02-26 | Payer: Medicare Other | Source: Home / Self Care | Admitting: Neurosurgery

## 2020-02-26 ENCOUNTER — Encounter (HOSPITAL_COMMUNITY): Admission: RE | Payer: Self-pay | Source: Home / Self Care

## 2020-02-26 DIAGNOSIS — M48062 Spinal stenosis, lumbar region with neurogenic claudication: Secondary | ICD-10-CM | POA: Diagnosis not present

## 2020-02-26 SURGERY — POSTERIOR LUMBAR FUSION 1 LEVEL
Anesthesia: General

## 2020-02-27 ENCOUNTER — Telehealth (HOSPITAL_COMMUNITY): Payer: Self-pay

## 2020-02-27 NOTE — Telephone Encounter (Signed)
Spoke with the patient, detailed instructions given. He stated that he understood and would be there for his test. Asked to call back with any questions. S.Jon [name] EMTP

## 2020-02-27 NOTE — Addendum Note (Signed)
Addended by: Senaida Ores on: 02/27/2020 11:24 AM   Modules accepted: Orders

## 2020-03-02 NOTE — Addendum Note (Signed)
Addended by: [name] on: 03/02/2020 11:49 AM   Modules accepted: Orders

## 2020-03-03 ENCOUNTER — Ambulatory Visit (INDEPENDENT_AMBULATORY_CARE_PROVIDER_SITE_OTHER): Payer: Medicare Other

## 2020-03-03 ENCOUNTER — Other Ambulatory Visit: Payer: Self-pay

## 2020-03-03 DIAGNOSIS — Z01810 Encounter for preprocedural cardiovascular examination: Secondary | ICD-10-CM

## 2020-03-03 LAB — MYOCARDIAL PERFUSION IMAGING
LV dias vol: 84 mL (ref 62–150)
LV sys vol: 33 mL
Peak HR: 90 {beats}/min
Rest HR: 66 {beats}/min
SDS: 1
SRS: 0
SSS: 1
TID: 1.08

## 2020-03-03 MED ORDER — TECHNETIUM TC 99M TETROFOSMIN IV KIT
31.9000 | PACK | Freq: Once | INTRAVENOUS | Status: AC | PRN
  Administered 2020-03-03: 31.9 via INTRAVENOUS

## 2020-03-03 MED ORDER — REGADENOSON 0.4 MG/5ML IV SOLN
0.4000 mg | Freq: Once | INTRAVENOUS | Status: AC
  Administered 2020-03-03: 0.4 mg via INTRAVENOUS

## 2020-03-03 MED ORDER — TECHNETIUM TC 99M TETROFOSMIN IV KIT
11.0000 | PACK | Freq: Once | INTRAVENOUS | Status: AC | PRN
Start: 2020-03-03 — End: 2020-03-03
  Administered 2020-03-03: 11 via INTRAVENOUS

## 2020-03-05 ENCOUNTER — Ambulatory Visit: Payer: Medicare Other | Admitting: Internal Medicine

## 2020-03-09 DIAGNOSIS — N189 Chronic kidney disease, unspecified: Secondary | ICD-10-CM | POA: Diagnosis not present

## 2020-03-09 DIAGNOSIS — E1122 Type 2 diabetes mellitus with diabetic chronic kidney disease: Secondary | ICD-10-CM | POA: Diagnosis not present

## 2020-03-09 DIAGNOSIS — I130 Hypertensive heart and chronic kidney disease with heart failure and stage 1 through stage 4 chronic kidney disease, or unspecified chronic kidney disease: Secondary | ICD-10-CM | POA: Diagnosis not present

## 2020-03-09 DIAGNOSIS — I509 Heart failure, unspecified: Secondary | ICD-10-CM | POA: Diagnosis not present

## 2020-03-11 ENCOUNTER — Other Ambulatory Visit (HOSPITAL_COMMUNITY): Payer: Self-pay | Admitting: Urology

## 2020-03-11 ENCOUNTER — Other Ambulatory Visit: Payer: Self-pay | Admitting: Urology

## 2020-03-11 DIAGNOSIS — N200 Calculus of kidney: Secondary | ICD-10-CM

## 2020-03-12 ENCOUNTER — Telehealth: Payer: Self-pay

## 2020-03-12 NOTE — Telephone Encounter (Signed)
   Primary Cardiologist: [name], MD  Chart reviewed as part of pre-operative protocol coverage. Recent echo with preserved LVEF. Stress test was low risk.   Given past medical history and time since last visit, based on ACC/AHA guidelines, GALE HULSE would be at acceptable risk for the planned procedure without further cardiovascular testing.   Okay to hold ASA for 5 day based on prior conversation,   I will route this recommendation to the requesting party via [facility] fax function and remove from pre-op pool.  Please call with questions.  [place], [place] 03/12/2020, 3:50 PM

## 2020-03-12 NOTE — Telephone Encounter (Signed)
   [facility] Pre-operative Risk Assessment    HEARTCARE STAFF: - Please ensure there is not already an duplicate clearance open for this procedure. - Under Visit Info/Reason for Call, type in Other and utilize the format Clearance MM/DD/YY or Clearance TBD. Do not use dashes or single digits. - If request is for dental extraction, please clarify the # of teeth to be extracted.  Request for surgical clearance:  1. What type of surgery is being performed? L2-3 Posterior Lumbar Interbody Fusion   2. When is this surgery scheduled? 04-08-2020   3. What type of clearance is required (medical clearance vs. Pharmacy clearance to hold med vs. Both)? Pharmacy  4. Are there any medications that need to be held prior to surgery and how long? Aspirin  5. Practice name and name of physician performing surgery? [facility] / Dr Newman Pies   6. What is the office phone number? [phone]   7.   What is the office fax number? [phone]  8.   Anesthesia type (None, local, MAC, general) ? General   Toni Arthurs 03/12/2020, 1:22 PM  _________________________________________________________________   (provider comments below)

## 2020-04-06 ENCOUNTER — Other Ambulatory Visit (HOSPITAL_COMMUNITY)
Admission: RE | Admit: 2020-04-06 | Discharge: 2020-04-06 | Disposition: A | Discharge status: Home / Self Care | Payer: Medicare Other | Source: Ambulatory Visit | Attending: Neurosurgery | Admitting: Neurosurgery

## 2020-04-06 DIAGNOSIS — I130 Hypertensive heart and chronic kidney disease with heart failure and stage 1 through stage 4 chronic kidney disease, or unspecified chronic kidney disease: Secondary | ICD-10-CM | POA: Diagnosis not present

## 2020-04-06 DIAGNOSIS — G4730 Sleep apnea, unspecified: Secondary | ICD-10-CM | POA: Diagnosis not present

## 2020-04-06 DIAGNOSIS — I509 Heart failure, unspecified: Secondary | ICD-10-CM | POA: Diagnosis not present

## 2020-04-06 DIAGNOSIS — N189 Chronic kidney disease, unspecified: Secondary | ICD-10-CM | POA: Diagnosis not present

## 2020-04-06 DIAGNOSIS — M4326 Fusion of spine, lumbar region: Secondary | ICD-10-CM | POA: Diagnosis not present

## 2020-04-06 DIAGNOSIS — E785 Hyperlipidemia, unspecified: Secondary | ICD-10-CM | POA: Diagnosis not present

## 2020-04-06 DIAGNOSIS — M5416 Radiculopathy, lumbar region: Secondary | ICD-10-CM | POA: Diagnosis not present

## 2020-04-06 DIAGNOSIS — Z9989 Dependence on other enabling machines and devices: Secondary | ICD-10-CM | POA: Diagnosis not present

## 2020-04-06 DIAGNOSIS — Z20822 Contact with and (suspected) exposure to covid-19: Secondary | ICD-10-CM | POA: Insufficient documentation

## 2020-04-06 DIAGNOSIS — E1122 Type 2 diabetes mellitus with diabetic chronic kidney disease: Secondary | ICD-10-CM | POA: Diagnosis not present

## 2020-04-06 DIAGNOSIS — Z01812 Encounter for preprocedural laboratory examination: Secondary | ICD-10-CM | POA: Insufficient documentation

## 2020-04-06 DIAGNOSIS — Z8249 Family history of ischemic heart disease and other diseases of the circulatory system: Secondary | ICD-10-CM | POA: Diagnosis not present

## 2020-04-06 DIAGNOSIS — Z888 Allergy status to other drugs, medicaments and biological substances status: Secondary | ICD-10-CM | POA: Diagnosis not present

## 2020-04-06 DIAGNOSIS — Z6833 Body mass index (BMI) 33.0-33.9, adult: Secondary | ICD-10-CM | POA: Diagnosis not present

## 2020-04-06 DIAGNOSIS — K219 Gastro-esophageal reflux disease without esophagitis: Secondary | ICD-10-CM | POA: Diagnosis not present

## 2020-04-06 DIAGNOSIS — Z940 Kidney transplant status: Secondary | ICD-10-CM | POA: Diagnosis not present

## 2020-04-06 DIAGNOSIS — Z8673 Personal history of transient ischemic attack (TIA), and cerebral infarction without residual deficits: Secondary | ICD-10-CM | POA: Diagnosis not present

## 2020-04-06 DIAGNOSIS — K449 Diaphragmatic hernia without obstruction or gangrene: Secondary | ICD-10-CM | POA: Diagnosis not present

## 2020-04-06 DIAGNOSIS — M4316 Spondylolisthesis, lumbar region: Secondary | ICD-10-CM | POA: Diagnosis not present

## 2020-04-06 DIAGNOSIS — E669 Obesity, unspecified: Secondary | ICD-10-CM | POA: Diagnosis present

## 2020-04-06 DIAGNOSIS — Z7982 Long term (current) use of aspirin: Secondary | ICD-10-CM | POA: Diagnosis not present

## 2020-04-06 DIAGNOSIS — M5116 Intervertebral disc disorders with radiculopathy, lumbar region: Secondary | ICD-10-CM | POA: Diagnosis not present

## 2020-04-06 DIAGNOSIS — Z833 Family history of diabetes mellitus: Secondary | ICD-10-CM | POA: Diagnosis not present

## 2020-04-06 DIAGNOSIS — Z981 Arthrodesis status: Secondary | ICD-10-CM | POA: Diagnosis not present

## 2020-04-06 DIAGNOSIS — Z841 Family history of disorders of kidney and ureter: Secondary | ICD-10-CM | POA: Diagnosis not present

## 2020-04-06 DIAGNOSIS — M48062 Spinal stenosis, lumbar region with neurogenic claudication: Secondary | ICD-10-CM | POA: Diagnosis not present

## 2020-04-06 DIAGNOSIS — Z881 Allergy status to other antibiotic agents status: Secondary | ICD-10-CM | POA: Diagnosis not present

## 2020-04-06 DIAGNOSIS — Z79899 Other long term (current) drug therapy: Secondary | ICD-10-CM | POA: Diagnosis not present

## 2020-04-06 DIAGNOSIS — Z83438 Family history of other disorder of lipoprotein metabolism and other lipidemia: Secondary | ICD-10-CM | POA: Diagnosis not present

## 2020-04-06 DIAGNOSIS — Z87891 Personal history of nicotine dependence: Secondary | ICD-10-CM | POA: Diagnosis not present

## 2020-04-06 DIAGNOSIS — G4733 Obstructive sleep apnea (adult) (pediatric): Secondary | ICD-10-CM | POA: Diagnosis not present

## 2020-04-06 LAB — SARS CORONAVIRUS 2 (TAT 6-24 HRS): SARS Coronavirus 2: NEGATIVE

## 2020-04-07 ENCOUNTER — Encounter (HOSPITAL_COMMUNITY): Payer: Self-pay | Admitting: Neurosurgery

## 2020-04-07 ENCOUNTER — Other Ambulatory Visit: Payer: Self-pay

## 2020-04-07 NOTE — Progress Notes (Addendum)
Mr. Hensen denies chest pain or shortness of breath. Patient was tested for Covid and has been in quarantine since that time.  Mr. Arreguin has type II diabetes- patient reports that CBG's run around 140 , last A1C was 7 something approximately 3 months ago.  I instructed Mr. Streett to take 1/2, 20 units of Basaglar tonight at hs. DO not take Tragenta in am, if CBG is greater than 220 take 1/2 of sliding scale dose. I instructed patient to check CBG after awaking and every 2 hours until arrival  to the hospital.  I Instructed patient if CBG is less than 70 to drink   1/2 cup of a clear juice. Recheck CBG in 15 minutes if CBG is not over 70 call, pre- op desk at [phone] for further instructions.   Mr. Pizzuto has a Loop recorder- it is no longer transmitting data.

## 2020-04-08 ENCOUNTER — Inpatient Hospital Stay (HOSPITAL_COMMUNITY)
Admission: RE | Admit: 2020-04-08 | Discharge: 2020-04-09 | DRG: 454 | Disposition: A | Discharge status: Home / Self Care | Payer: Medicare Other | Attending: Neurosurgery | Admitting: Neurosurgery

## 2020-04-08 ENCOUNTER — Inpatient Hospital Stay (HOSPITAL_COMMUNITY): Payer: Medicare Other | Admitting: Anesthesiology

## 2020-04-08 ENCOUNTER — Inpatient Hospital Stay (HOSPITAL_COMMUNITY): Payer: Medicare Other

## 2020-04-08 ENCOUNTER — Encounter (HOSPITAL_COMMUNITY): Payer: Self-pay | Admitting: Neurosurgery

## 2020-04-08 ENCOUNTER — Other Ambulatory Visit: Payer: Self-pay

## 2020-04-08 ENCOUNTER — Inpatient Hospital Stay (HOSPITAL_COMMUNITY)
Admission: RE | Disposition: A | Discharge status: Home / Self Care | Payer: Self-pay | Source: Home / Self Care | Attending: Neurosurgery

## 2020-04-08 DIAGNOSIS — M4800 Spinal stenosis, site unspecified: Secondary | ICD-10-CM

## 2020-04-08 DIAGNOSIS — Z6833 Body mass index (BMI) 33.0-33.9, adult: Secondary | ICD-10-CM | POA: Diagnosis not present

## 2020-04-08 DIAGNOSIS — Z833 Family history of diabetes mellitus: Secondary | ICD-10-CM | POA: Diagnosis not present

## 2020-04-08 DIAGNOSIS — E785 Hyperlipidemia, unspecified: Secondary | ICD-10-CM | POA: Diagnosis present

## 2020-04-08 DIAGNOSIS — M5416 Radiculopathy, lumbar region: Secondary | ICD-10-CM | POA: Diagnosis present

## 2020-04-08 DIAGNOSIS — Z8249 Family history of ischemic heart disease and other diseases of the circulatory system: Secondary | ICD-10-CM | POA: Diagnosis not present

## 2020-04-08 DIAGNOSIS — Z87891 Personal history of nicotine dependence: Secondary | ICD-10-CM | POA: Diagnosis not present

## 2020-04-08 DIAGNOSIS — K219 Gastro-esophageal reflux disease without esophagitis: Secondary | ICD-10-CM | POA: Diagnosis present

## 2020-04-08 DIAGNOSIS — M4316 Spondylolisthesis, lumbar region: Secondary | ICD-10-CM | POA: Diagnosis not present

## 2020-04-08 DIAGNOSIS — Z83438 Family history of other disorder of lipoprotein metabolism and other lipidemia: Secondary | ICD-10-CM | POA: Diagnosis not present

## 2020-04-08 DIAGNOSIS — Z940 Kidney transplant status: Secondary | ICD-10-CM

## 2020-04-08 DIAGNOSIS — Z794 Long term (current) use of insulin: Secondary | ICD-10-CM

## 2020-04-08 DIAGNOSIS — I509 Heart failure, unspecified: Secondary | ICD-10-CM | POA: Diagnosis not present

## 2020-04-08 DIAGNOSIS — Z79899 Other long term (current) drug therapy: Secondary | ICD-10-CM | POA: Diagnosis not present

## 2020-04-08 DIAGNOSIS — Z881 Allergy status to other antibiotic agents status: Secondary | ICD-10-CM

## 2020-04-08 DIAGNOSIS — E1122 Type 2 diabetes mellitus with diabetic chronic kidney disease: Secondary | ICD-10-CM | POA: Diagnosis present

## 2020-04-08 DIAGNOSIS — Z7952 Long term (current) use of systemic steroids: Secondary | ICD-10-CM

## 2020-04-08 DIAGNOSIS — G4730 Sleep apnea, unspecified: Secondary | ICD-10-CM | POA: Diagnosis present

## 2020-04-08 DIAGNOSIS — Z20822 Contact with and (suspected) exposure to covid-19: Secondary | ICD-10-CM | POA: Diagnosis present

## 2020-04-08 DIAGNOSIS — K449 Diaphragmatic hernia without obstruction or gangrene: Secondary | ICD-10-CM | POA: Diagnosis present

## 2020-04-08 DIAGNOSIS — Z8673 Personal history of transient ischemic attack (TIA), and cerebral infarction without residual deficits: Secondary | ICD-10-CM | POA: Diagnosis not present

## 2020-04-08 DIAGNOSIS — Z905 Acquired absence of kidney: Secondary | ICD-10-CM

## 2020-04-08 DIAGNOSIS — Z981 Arthrodesis status: Secondary | ICD-10-CM | POA: Diagnosis not present

## 2020-04-08 DIAGNOSIS — N1832 Chronic kidney disease, stage 3b: Secondary | ICD-10-CM | POA: Diagnosis present

## 2020-04-08 DIAGNOSIS — Z7982 Long term (current) use of aspirin: Secondary | ICD-10-CM

## 2020-04-08 DIAGNOSIS — Z888 Allergy status to other drugs, medicaments and biological substances status: Secondary | ICD-10-CM

## 2020-04-08 DIAGNOSIS — M4326 Fusion of spine, lumbar region: Secondary | ICD-10-CM | POA: Diagnosis not present

## 2020-04-08 DIAGNOSIS — M48062 Spinal stenosis, lumbar region with neurogenic claudication: Secondary | ICD-10-CM | POA: Diagnosis not present

## 2020-04-08 DIAGNOSIS — I130 Hypertensive heart and chronic kidney disease with heart failure and stage 1 through stage 4 chronic kidney disease, or unspecified chronic kidney disease: Secondary | ICD-10-CM | POA: Diagnosis not present

## 2020-04-08 DIAGNOSIS — E669 Obesity, unspecified: Secondary | ICD-10-CM | POA: Diagnosis not present

## 2020-04-08 DIAGNOSIS — Z841 Family history of disorders of kidney and ureter: Secondary | ICD-10-CM

## 2020-04-08 DIAGNOSIS — N189 Chronic kidney disease, unspecified: Secondary | ICD-10-CM | POA: Diagnosis not present

## 2020-04-08 DIAGNOSIS — Z419 Encounter for procedure for purposes other than remedying health state, unspecified: Secondary | ICD-10-CM

## 2020-04-08 HISTORY — DX: Personal history of other medical treatment: Z92.89

## 2020-04-08 HISTORY — DX: Dyspnea, unspecified: R06.00

## 2020-04-08 HISTORY — PX: LUMBAR FUSION: SHX111

## 2020-04-08 HISTORY — DX: Cardiac murmur, unspecified: R01.1

## 2020-04-08 HISTORY — DX: Gastrointestinal hemorrhage, unspecified: K92.2

## 2020-04-08 HISTORY — DX: Personal history of urinary calculi: Z87.442

## 2020-04-08 LAB — BASIC METABOLIC PANEL
Anion gap: 10 (ref 5–15)
BUN: 28 mg/dL — ABNORMAL HIGH (ref 8–23)
CO2: 24 mmol/L (ref 22–32)
Calcium: 8.9 mg/dL (ref 8.9–10.3)
Chloride: 106 mmol/L (ref 98–111)
Creatinine, Ser: 1.98 mg/dL — ABNORMAL HIGH (ref 0.61–1.24)
GFR, Estimated: 36 mL/min — ABNORMAL LOW (ref >60)
Glucose, Bld: 137 mg/dL — ABNORMAL HIGH (ref 70–99)
Potassium: 4 mmol/L (ref 3.5–5.1)
Sodium: 140 mmol/L (ref 135–145)

## 2020-04-08 LAB — CBC
HCT: 37.4 % — ABNORMAL LOW (ref 39.0–52.0)
Hemoglobin: 11.5 g/dL — ABNORMAL LOW (ref 13.0–17.0)
MCH: 27.9 pg (ref 26.0–34.0)
MCHC: 30.7 g/dL (ref 30.0–36.0)
MCV: 90.8 fL (ref 80.0–100.0)
Platelets: 120 10*3/uL — ABNORMAL LOW (ref 150–400)
RBC: 4.12 MIL/uL — ABNORMAL LOW (ref 4.22–5.81)
RDW: 14.6 % (ref 11.5–15.5)
WBC: 8 10*3/uL (ref 4.0–10.5)
nRBC: 0 % (ref 0.0–0.2)

## 2020-04-08 LAB — HEMOGLOBIN A1C
Hgb A1c MFr Bld: 7.2 % — ABNORMAL HIGH (ref 4.8–5.6)
Mean Plasma Glucose: 159.94 mg/dL

## 2020-04-08 LAB — TYPE AND SCREEN
ABO/RH(D): O POS
Antibody Screen: NEGATIVE

## 2020-04-08 LAB — GLUCOSE, CAPILLARY
Glucose-Capillary: 121 mg/dL — ABNORMAL HIGH (ref 70–99)
Glucose-Capillary: 137 mg/dL — ABNORMAL HIGH (ref 70–99)
Glucose-Capillary: 237 mg/dL — ABNORMAL HIGH (ref 70–99)
Glucose-Capillary: 209 mg/dL — ABNORMAL HIGH (ref 70–99)

## 2020-04-08 LAB — SURGICAL PCR SCREEN
MRSA, PCR: NEGATIVE
Staphylococcus aureus: NEGATIVE

## 2020-04-08 SURGERY — POSTERIOR LUMBAR FUSION 1 LEVEL
Anesthesia: General

## 2020-04-08 MED ORDER — CHLORHEXIDINE GLUCONATE CLOTH 2 % EX PADS: 6.0000 | MEDICATED_PAD | Freq: Once | CUTANEOUS | Status: DC

## 2020-04-08 MED ORDER — INSULIN ASPART 100 UNIT/ML ~~LOC~~ SOLN
0.0000 [IU] | Freq: Every day | SUBCUTANEOUS | Status: DC
  Administered 2020-04-08: 2 [IU] via SUBCUTANEOUS

## 2020-04-08 MED ORDER — OXYCODONE HCL 5 MG PO TABS: 10.0000 mg | ORAL_TABLET | ORAL | Status: DC | PRN

## 2020-04-08 MED ORDER — MYCOPHENOLATE SODIUM 180 MG PO TBEC
180.0000 mg | DELAYED_RELEASE_TABLET | Freq: Two times a day (BID) | ORAL | Status: DC
  Administered 2020-04-08 – 2020-04-09 (×2): 180 mg via ORAL
  Filled 2020-04-08 (×4): qty 1

## 2020-04-08 MED ORDER — OXYCODONE HCL 5 MG PO TABS: 5.0000 mg | ORAL_TABLET | ORAL | Status: DC | PRN

## 2020-04-08 MED ORDER — ONDANSETRON HCL 4 MG/2ML IJ SOLN
INTRAMUSCULAR | Status: DC | PRN
  Administered 2020-04-08: 4 mg via INTRAVENOUS

## 2020-04-08 MED ORDER — 0.9 % SODIUM CHLORIDE (POUR BTL) OPTIME
TOPICAL | Status: DC | PRN
  Administered 2020-04-08: 1000 mL

## 2020-04-08 MED ORDER — INSULIN ASPART 100 UNIT/ML ~~LOC~~ SOLN
SUBCUTANEOUS | Status: AC
  Filled 2020-04-08: qty 1

## 2020-04-08 MED ORDER — ORAL CARE MOUTH RINSE: 15.0000 mL | Freq: Once | OROMUCOSAL | Status: AC

## 2020-04-08 MED ORDER — DEXAMETHASONE SODIUM PHOSPHATE 10 MG/ML IJ SOLN
INTRAMUSCULAR | Status: DC | PRN
  Administered 2020-04-08: 4 mg via INTRAVENOUS

## 2020-04-08 MED ORDER — CHLORHEXIDINE GLUCONATE 0.12 % MT SOLN
OROMUCOSAL | Status: AC
  Administered 2020-04-08: 15 mL via OROMUCOSAL
  Filled 2020-04-08: qty 15

## 2020-04-08 MED ORDER — ONDANSETRON HCL 4 MG/2ML IJ SOLN
INTRAMUSCULAR | Status: AC
  Filled 2020-04-08: qty 2

## 2020-04-08 MED ORDER — ALBUMIN HUMAN 5 % IV SOLN: INTRAVENOUS | Status: DC | PRN

## 2020-04-08 MED ORDER — ONDANSETRON HCL 4 MG/2ML IJ SOLN
4.0000 mg | Freq: Four times a day (QID) | INTRAMUSCULAR | Status: DC | PRN
  Administered 2020-04-08: 4 mg via INTRAVENOUS
  Filled 2020-04-08 (×2): qty 2

## 2020-04-08 MED ORDER — FENTANYL CITRATE (PF) 250 MCG/5ML IJ SOLN
INTRAMUSCULAR | Status: DC | PRN
  Administered 2020-04-08 (×3): 50 ug via INTRAVENOUS
  Administered 2020-04-08: 100 ug via INTRAVENOUS

## 2020-04-08 MED ORDER — PANTOPRAZOLE SODIUM 40 MG PO TBEC
40.0000 mg | DELAYED_RELEASE_TABLET | Freq: Two times a day (BID) | ORAL | Status: DC
  Administered 2020-04-08 – 2020-04-09 (×2): 40 mg via ORAL
  Filled 2020-04-08 (×2): qty 1

## 2020-04-08 MED ORDER — MORPHINE SULFATE (PF) 4 MG/ML IV SOLN: 4.0000 mg | INTRAVENOUS | Status: DC | PRN

## 2020-04-08 MED ORDER — FENTANYL CITRATE (PF) 100 MCG/2ML IJ SOLN
INTRAMUSCULAR | Status: AC
  Filled 2020-04-08: qty 2

## 2020-04-08 MED ORDER — INSULIN ASPART 100 UNIT/ML ~~LOC~~ SOLN: 0.0000 [IU] | Freq: Three times a day (TID) | SUBCUTANEOUS | Status: DC

## 2020-04-08 MED ORDER — TACROLIMUS 1 MG PO CAPS
2.0000 mg | ORAL_CAPSULE | Freq: Two times a day (BID) | ORAL | Status: DC
  Administered 2020-04-08 – 2020-04-09 (×2): 2 mg via ORAL
  Filled 2020-04-08 (×4): qty 2

## 2020-04-08 MED ORDER — SODIUM CHLORIDE 0.9 % IV SOLN: INTRAVENOUS | Status: DC

## 2020-04-08 MED ORDER — BUPIVACAINE LIPOSOME 1.3 % IJ SUSP
INTRAMUSCULAR | Status: DC | PRN
  Administered 2020-04-08: 20 mL

## 2020-04-08 MED ORDER — ROCURONIUM BROMIDE 10 MG/ML (PF) SYRINGE
PREFILLED_SYRINGE | INTRAVENOUS | Status: AC
  Filled 2020-04-08: qty 10

## 2020-04-08 MED ORDER — PHENOL 1.4 % MT LIQD: 1.0000 | OROMUCOSAL | Status: DC | PRN

## 2020-04-08 MED ORDER — PROPOFOL 10 MG/ML IV BOLUS
INTRAVENOUS | Status: AC
  Filled 2020-04-08: qty 20

## 2020-04-08 MED ORDER — THROMBIN 5000 UNITS EX SOLR
CUTANEOUS | Status: AC
  Filled 2020-04-08: qty 5000

## 2020-04-08 MED ORDER — GABAPENTIN 300 MG PO CAPS
300.0000 mg | ORAL_CAPSULE | Freq: Three times a day (TID) | ORAL | Status: DC
Start: 2020-04-08 — End: 2020-04-09
  Administered 2020-04-08 – 2020-04-09 (×2): 300 mg via ORAL
  Filled 2020-04-08 (×2): qty 1

## 2020-04-08 MED ORDER — PREDNISONE 5 MG PO TABS
5.0000 mg | ORAL_TABLET | Freq: Every day | ORAL | Status: DC
  Administered 2020-04-09: 5 mg via ORAL
  Filled 2020-04-08: qty 1

## 2020-04-08 MED ORDER — CYCLOBENZAPRINE HCL 10 MG PO TABS
10.0000 mg | ORAL_TABLET | Freq: Three times a day (TID) | ORAL | Status: DC | PRN
  Administered 2020-04-09: 10 mg via ORAL
  Filled 2020-04-08: qty 1

## 2020-04-08 MED ORDER — SODIUM CHLORIDE 0.9% FLUSH
3.0000 mL | Freq: Two times a day (BID) | INTRAVENOUS | Status: DC
  Administered 2020-04-08: 3 mL via INTRAVENOUS

## 2020-04-08 MED ORDER — SODIUM CHLORIDE 0.9% FLUSH: 3.0000 mL | INTRAVENOUS | Status: DC | PRN

## 2020-04-08 MED ORDER — FUROSEMIDE 40 MG PO TABS
40.0000 mg | ORAL_TABLET | Freq: Three times a day (TID) | ORAL | Status: DC
  Administered 2020-04-08 – 2020-04-09 (×2): 40 mg via ORAL
  Filled 2020-04-08 (×2): qty 1

## 2020-04-08 MED ORDER — ROCURONIUM BROMIDE 10 MG/ML (PF) SYRINGE
PREFILLED_SYRINGE | INTRAVENOUS | Status: DC | PRN
  Administered 2020-04-08 (×2): 20 mg via INTRAVENOUS
  Administered 2020-04-08: 25 mg via INTRAVENOUS
  Administered 2020-04-08: 75 mg via INTRAVENOUS

## 2020-04-08 MED ORDER — LACTATED RINGERS IV SOLN: INTRAVENOUS | Status: DC | PRN

## 2020-04-08 MED ORDER — BACITRACIN ZINC 500 UNIT/GM EX OINT
TOPICAL_OINTMENT | CUTANEOUS | Status: AC
  Filled 2020-04-08: qty 28.35

## 2020-04-08 MED ORDER — AMLODIPINE BESYLATE 5 MG PO TABS
10.0000 mg | ORAL_TABLET | Freq: Every day | ORAL | Status: DC
  Administered 2020-04-08 – 2020-04-09 (×2): 10 mg via ORAL
  Filled 2020-04-08 (×2): qty 2

## 2020-04-08 MED ORDER — BISACODYL 10 MG RE SUPP: 10.0000 mg | Freq: Every day | RECTAL | Status: DC | PRN

## 2020-04-08 MED ORDER — FENTANYL CITRATE (PF) 100 MCG/2ML IJ SOLN
25.0000 ug | INTRAMUSCULAR | Status: DC | PRN
  Administered 2020-04-08 (×2): 50 ug via INTRAVENOUS

## 2020-04-08 MED ORDER — LINAGLIPTIN 5 MG PO TABS
5.0000 mg | ORAL_TABLET | Freq: Every day | ORAL | Status: DC
  Administered 2020-04-08 – 2020-04-09 (×2): 5 mg via ORAL
  Filled 2020-04-08 (×3): qty 1

## 2020-04-08 MED ORDER — INSULIN ASPART 100 UNIT/ML ~~LOC~~ SOLN
0.0000 [IU] | Freq: Three times a day (TID) | SUBCUTANEOUS | Status: DC
  Administered 2020-04-09: 7 [IU] via SUBCUTANEOUS

## 2020-04-08 MED ORDER — FENTANYL CITRATE (PF) 250 MCG/5ML IJ SOLN
INTRAMUSCULAR | Status: AC
  Filled 2020-04-08: qty 5

## 2020-04-08 MED ORDER — DOCUSATE SODIUM 100 MG PO CAPS
100.0000 mg | ORAL_CAPSULE | Freq: Two times a day (BID) | ORAL | Status: DC
  Administered 2020-04-08 – 2020-04-09 (×2): 100 mg via ORAL
  Filled 2020-04-08 (×2): qty 1

## 2020-04-08 MED ORDER — LIDOCAINE 2% (20 MG/ML) 5 ML SYRINGE
INTRAMUSCULAR | Status: DC | PRN
  Administered 2020-04-08: 100 mg via INTRAVENOUS

## 2020-04-08 MED ORDER — LIDOCAINE 2% (20 MG/ML) 5 ML SYRINGE
INTRAMUSCULAR | Status: AC
  Filled 2020-04-08: qty 5

## 2020-04-08 MED ORDER — BUPIVACAINE-EPINEPHRINE (PF) 0.5% -1:200000 IJ SOLN
INTRAMUSCULAR | Status: DC | PRN
  Administered 2020-04-08: 10 mL

## 2020-04-08 MED ORDER — SUGAMMADEX SODIUM 200 MG/2ML IV SOLN
INTRAVENOUS | Status: DC | PRN
  Administered 2020-04-08: 300 mg via INTRAVENOUS

## 2020-04-08 MED ORDER — TAMSULOSIN HCL 0.4 MG PO CAPS
0.4000 mg | ORAL_CAPSULE | Freq: Every day | ORAL | Status: DC
  Administered 2020-04-08: 0.4 mg via ORAL
  Filled 2020-04-08: qty 1

## 2020-04-08 MED ORDER — CEFAZOLIN SODIUM-DEXTROSE 2-4 GM/100ML-% IV SOLN
2.0000 g | Freq: Three times a day (TID) | INTRAVENOUS | Status: AC
  Administered 2020-04-08 – 2020-04-09 (×2): 2 g via INTRAVENOUS
  Filled 2020-04-08 (×2): qty 100

## 2020-04-08 MED ORDER — MIDAZOLAM HCL 5 MG/5ML IJ SOLN
INTRAMUSCULAR | Status: DC | PRN
  Administered 2020-04-08: 2 mg via INTRAVENOUS

## 2020-04-08 MED ORDER — MIDAZOLAM HCL 2 MG/2ML IJ SOLN
INTRAMUSCULAR | Status: AC
  Filled 2020-04-08: qty 2

## 2020-04-08 MED ORDER — PHENYLEPHRINE HCL-NACL 10-0.9 MG/250ML-% IV SOLN
INTRAVENOUS | Status: DC | PRN
  Administered 2020-04-08: 40 ug/min via INTRAVENOUS

## 2020-04-08 MED ORDER — ONDANSETRON HCL 4 MG PO TABS: 4.0000 mg | ORAL_TABLET | Freq: Four times a day (QID) | ORAL | Status: DC | PRN

## 2020-04-08 MED ORDER — MENTHOL 3 MG MT LOZG: 1.0000 | LOZENGE | OROMUCOSAL | Status: DC | PRN

## 2020-04-08 MED ORDER — SODIUM CHLORIDE (PF) 0.9 % IJ SOLN
INTRAMUSCULAR | Status: AC
  Filled 2020-04-08: qty 10

## 2020-04-08 MED ORDER — ATORVASTATIN CALCIUM 40 MG PO TABS
40.0000 mg | ORAL_TABLET | Freq: Every day | ORAL | Status: DC
  Administered 2020-04-08: 40 mg via ORAL
  Filled 2020-04-08: qty 1

## 2020-04-08 MED ORDER — METHIMAZOLE 5 MG PO TABS
2.5000 mg | ORAL_TABLET | Freq: Every day | ORAL | Status: DC
  Administered 2020-04-08 – 2020-04-09 (×2): 2.5 mg via ORAL
  Filled 2020-04-08 (×3): qty 1

## 2020-04-08 MED ORDER — PROPOFOL 10 MG/ML IV BOLUS
INTRAVENOUS | Status: DC | PRN
  Administered 2020-04-08: 200 mg via INTRAVENOUS

## 2020-04-08 MED ORDER — HYDROCODONE-ACETAMINOPHEN 10-325 MG PO TABS
1.0000 | ORAL_TABLET | ORAL | Status: DC | PRN
  Administered 2020-04-08 – 2020-04-09 (×4): 2 via ORAL
  Filled 2020-04-08 (×4): qty 2

## 2020-04-08 MED ORDER — ACETAMINOPHEN 10 MG/ML IV SOLN: 1000.0000 mg | Freq: Once | INTRAVENOUS | Status: DC | PRN

## 2020-04-08 MED ORDER — SODIUM CHLORIDE 0.9 % IV SOLN: 250.0000 mL | INTRAVENOUS | Status: DC

## 2020-04-08 MED ORDER — BUPIVACAINE LIPOSOME 1.3 % IJ SUSP
INTRAMUSCULAR | Status: AC
  Filled 2020-04-08: qty 20

## 2020-04-08 MED ORDER — ACETAMINOPHEN 325 MG PO TABS: 650.0000 mg | ORAL_TABLET | ORAL | Status: DC | PRN

## 2020-04-08 MED ORDER — CHLORHEXIDINE GLUCONATE 0.12 % MT SOLN: 15.0000 mL | Freq: Once | OROMUCOSAL | Status: AC

## 2020-04-08 MED ORDER — ACETAMINOPHEN 500 MG PO TABS
1000.0000 mg | ORAL_TABLET | Freq: Four times a day (QID) | ORAL | Status: DC
  Filled 2020-04-08: qty 2

## 2020-04-08 MED ORDER — INSULIN ASPART 100 UNIT/ML ~~LOC~~ SOLN
0.0000 [IU] | SUBCUTANEOUS | Status: DC
  Administered 2020-04-08: 7 [IU] via SUBCUTANEOUS

## 2020-04-08 MED ORDER — BUPIVACAINE-EPINEPHRINE 0.5% -1:200000 IJ SOLN
INTRAMUSCULAR | Status: AC
  Filled 2020-04-08: qty 1

## 2020-04-08 MED ORDER — PROMETHAZINE HCL 25 MG/ML IJ SOLN: 6.2500 mg | INTRAMUSCULAR | Status: DC | PRN

## 2020-04-08 MED ORDER — THROMBIN 5000 UNITS EX SOLR
OROMUCOSAL | Status: DC | PRN
  Administered 2020-04-08 (×2): 5 mL via TOPICAL

## 2020-04-08 MED ORDER — ACETAMINOPHEN 650 MG RE SUPP: 650.0000 mg | RECTAL | Status: DC | PRN

## 2020-04-08 MED ORDER — CEFAZOLIN SODIUM-DEXTROSE 2-4 GM/100ML-% IV SOLN
2.0000 g | INTRAVENOUS | Status: AC
  Administered 2020-04-08: 2 g via INTRAVENOUS
  Filled 2020-04-08: qty 100

## 2020-04-08 MED ORDER — BACITRACIN ZINC 500 UNIT/GM EX OINT
TOPICAL_OINTMENT | CUTANEOUS | Status: DC | PRN
  Administered 2020-04-08: 1 via TOPICAL

## 2020-04-08 SURGICAL SUPPLY — 64 items
APL SKNCLS STERI-STRIP NONHPOA (GAUZE/BANDAGES/DRESSINGS) ×1
BENZOIN TINCTURE PRP APPL 2/3 (GAUZE/BANDAGES/DRESSINGS) ×2 IMPLANT
BLADE CLIPPER SURG (BLADE) IMPLANT
BUR MATCHSTICK NEURO 3.0 LAGG (BURR) ×2 IMPLANT
BUR PRECISION FLUTE 6.0 (BURR) ×2 IMPLANT
CAGE ALTERA 10X31X9-13 15D (Cage) ×1 IMPLANT
CANISTER SUCT 3000ML PPV (MISCELLANEOUS) ×2 IMPLANT
CAP LOCK DLX THRD (Cap) ×4 IMPLANT
CARTRIDGE OIL MAESTRO DRILL (MISCELLANEOUS) ×1 IMPLANT
CNTNR URN SCR LID CUP LEK RST (MISCELLANEOUS) ×1 IMPLANT
CONT SPEC 4OZ STRL OR WHT (MISCELLANEOUS) ×2
COVER BACK TABLE 60X90IN (DRAPES) ×2 IMPLANT
COVER WAND RF STERILE (DRAPES) ×2 IMPLANT
DECANTER SPIKE VIAL GLASS SM (MISCELLANEOUS) ×2 IMPLANT
DIFFUSER DRILL AIR PNEUMATIC (MISCELLANEOUS) ×2 IMPLANT
DRAPE C-ARM 42X72 X-RAY (DRAPES) ×5 IMPLANT
DRAPE HALF SHEET 40X57 (DRAPES) ×2 IMPLANT
DRAPE HALF SHEET 70X43 (DRAPES) ×2 IMPLANT
DRAPE LAPAROTOMY 100X72X124 (DRAPES) ×2 IMPLANT
DRAPE SURG 17X23 STRL (DRAPES) ×8 IMPLANT
DRSG OPSITE POSTOP 4X6 (GAUZE/BANDAGES/DRESSINGS) ×2 IMPLANT
DRSG OPSITE POSTOP 4X8 (GAUZE/BANDAGES/DRESSINGS) ×1 IMPLANT
ELECT BLADE 4.0 EZ CLEAN MEGAD (MISCELLANEOUS) ×2
ELECT REM PT RETURN 9FT ADLT (ELECTROSURGICAL) ×2
ELECTRODE BLDE 4.0 EZ CLN MEGD (MISCELLANEOUS) ×1 IMPLANT
ELECTRODE REM PT RTRN 9FT ADLT (ELECTROSURGICAL) ×1 IMPLANT
EVACUATOR 1/8 PVC DRAIN (DRAIN) IMPLANT
GAUZE 4X4 16PLY RFD (DISPOSABLE) ×2 IMPLANT
GLOVE BIO SURGEON STRL SZ8 (GLOVE) ×4 IMPLANT
GLOVE BIO SURGEON STRL SZ8.5 (GLOVE) ×4 IMPLANT
GLOVE EXAM NITRILE XL STR (GLOVE) IMPLANT
GOWN STRL REUS W/ TWL LRG LVL3 (GOWN DISPOSABLE) IMPLANT
GOWN STRL REUS W/ TWL XL LVL3 (GOWN DISPOSABLE) ×2 IMPLANT
GOWN STRL REUS W/TWL 2XL LVL3 (GOWN DISPOSABLE) IMPLANT
GOWN STRL REUS W/TWL LRG LVL3 (GOWN DISPOSABLE)
GOWN STRL REUS W/TWL XL LVL3 (GOWN DISPOSABLE) ×4
HEMOSTAT POWDER KIT SURGIFOAM (HEMOSTASIS) ×3 IMPLANT
KIT BASIN OR (CUSTOM PROCEDURE TRAY) ×2 IMPLANT
KIT TURNOVER KIT B (KITS) ×2 IMPLANT
MILL MEDIUM DISP (BLADE) ×2 IMPLANT
NDL HYPO 21X1.5 SAFETY (NEEDLE) IMPLANT
NEEDLE HYPO 21X1.5 SAFETY (NEEDLE) IMPLANT
NEEDLE HYPO 22GX1.5 SAFETY (NEEDLE) ×2 IMPLANT
NS IRRIG 1000ML POUR BTL (IV SOLUTION) ×2 IMPLANT
OIL CARTRIDGE MAESTRO DRILL (MISCELLANEOUS) ×2
PACK LAMINECTOMY NEURO (CUSTOM PROCEDURE TRAY) ×2 IMPLANT
PAD ARMBOARD 7.5X6 YLW CONV (MISCELLANEOUS) ×6 IMPLANT
PATTIES SURGICAL .5 X1 (DISPOSABLE) IMPLANT
PATTIES SURGICAL 1X1 (DISPOSABLE) ×1 IMPLANT
PUTTY DBM 5CC CALC GRAN ×2 IMPLANT
ROD CURVED TI 6.35X55 (Rod) ×2 IMPLANT
SCREW PA DLX CREO 7.5X55 (Screw) ×4 IMPLANT
SPONGE LAP 4X18 RFD (DISPOSABLE) IMPLANT
SPONGE NEURO XRAY DETECT 1X3 (DISPOSABLE) IMPLANT
SPONGE SURGIFOAM ABS GEL 100 (HEMOSTASIS) IMPLANT
STRIP CLOSURE SKIN 1/2X4 (GAUZE/BANDAGES/DRESSINGS) ×2 IMPLANT
SUT VIC AB 1 CT1 18XBRD ANBCTR (SUTURE) ×2 IMPLANT
SUT VIC AB 1 CT1 8-18 (SUTURE) ×4
SUT VIC AB 2-0 CP2 18 (SUTURE) ×4 IMPLANT
SYR 20ML LL LF (SYRINGE) IMPLANT
TOWEL GREEN STERILE (TOWEL DISPOSABLE) ×2 IMPLANT
TOWEL GREEN STERILE FF (TOWEL DISPOSABLE) ×2 IMPLANT
TRAY FOLEY MTR SLVR 16FR STAT (SET/KITS/TRAYS/PACK) ×2 IMPLANT
WATER STERILE IRR 1000ML POUR (IV SOLUTION) ×2 IMPLANT

## 2020-04-08 NOTE — H&P (Signed)
Subjective: The patient is a 70 year old white male on whom I previously performed lumbar fusions.  He did well until recently when he developed recurrent back and leg pain consistent with neurogenic claudication.  He failed medical management.  He was worked up with lumbar x-rays and lumbar MRI which demonstrated an L2-3 spinal listhesis with severe spinal stenosis.  I discussed the situation with the patient.  He has decided to proceed with surgery.  Past Medical History:  Diagnosis Date  . Abnormal gait   . Anemia of chronic disease   . Arthritis    back, hands   . Chronic back pain    radiculopathy and stenosis  . Chronic ischemic heart disease    Severe LVH  . Chronic kidney disease 2015   transplant  . Congestive heart failure (CHF) ([place])   . CVA (cerebral infarction) 03/2013  . Diabetes mellitus     Lantus nightly ;type 2  . Dyspnea   . GERD (gastroesophageal reflux disease)   . GI bleed    5 units oblood   . H/O hiatal hernia   . Heart murmur   . History of blood transfusion   . History of colon polyps   . History of kidney stones    passed  . Hx of cardiovascular stress test    a.  Lexiscan Myoview (05/2013):  No ischemia, EF 53%; Normal Study  . Hyperlipidemia    takes Lovastatin daily  . Hypertension    takes Amlodipine and Catapres daily    dr Forrestine Him, Loop Recorder - [facility]  . Nocturia   . Obesity   . Peripheral edema    takes Lasix daily  . Pneumonia 2021  . Sleep apnea    cpap , study in their home, 09/2102- [employer]           . Stroke [facility]) 2015   speech, rt arm weakness- 04/07/20 - no residual   . Urinary frequency     Past Surgical History:  Procedure Laterality Date  . AV FISTULA PLACEMENT Left 04/03/2013   Procedure: ARTERIOVENOUS (AV) FISTULA CREATION- LEFT RADIOCEPHALIC VS BRACHIOCEPHALIC;  Surgeon: Conrad Fishers, MD;  Location: [place];  Service: Vascular;  Laterality: Left;  . AV FISTULA PLACEMENT Left 05/14/2013   Procedure: LEFT  BRACHIOCEPHALIC ARTERIOVENOUS (AV) FISTULA CREATION;  Surgeon: Conrad [place], MD;  Location: [place];  Service: Vascular;  Laterality: Left;  . BACK SURGERY  12/2012   2003- 1st back surgery & then 2014- fusion  . COLONOSCOPY    . ESOPHAGOGASTRODUODENOSCOPY    . ESOPHAGOGASTRODUODENOSCOPY (EGD) WITH PROPOFOL N/A 01/22/2019   Procedure: ESOPHAGOGASTRODUODENOSCOPY (EGD) WITH PROPOFOL;  Surgeon: [name], MD;  Location: [place];  Service: Gastroenterology;  Laterality: N/A;  . HEMORRHOID SURGERY    . HEMOSTASIS CLIP PLACEMENT  01/22/2019   Procedure: HEMOSTASIS CLIP PLACEMENT;  Surgeon: [name], MD;  Location: [facility] ENDOSCOPY;  Service: Gastroenterology;;  . KIDNEY TRANSPLANT  10/01/2013  . left knee surgery    . LOOP RECORDER IMPLANT  03/19/13   MDT LinQ implanted by Dr Rayann Heman for cryptogenic stroke  . LOOP RECORDER IMPLANT N/A 03/19/2013   Procedure: LOOP RECORDER IMPLANT;  Surgeon: Coralyn Mark, MD;  Location: [place] CATH LAB;  Service: Cardiovascular;  Laterality: N/A;  . NEPHRECTOMY TRANSPLANTED ORGAN    . NEUROPLASTY / TRANSPOSITION MEDIAN NERVE AT CARPAL TUNNEL BILATERAL    . right leg surgery     pin in place  . right wrist surgery    .  SCLEROTHERAPY  01/22/2019   Procedure: Clide Deutscher;  Surgeon: [name], MD;  Location: [facility] ENDOSCOPY;  Service: Gastroenterology;;  . TEE WITHOUT CARDIOVERSION N/A 03/19/2013   Procedure: TRANSESOPHAGEAL ECHOCARDIOGRAM (TEE);  Surgeon: [name], MD;  Location: [facility] ENDOSCOPY;  Service: Cardiovascular;  Laterality: N/A;  . THYROID SURGERY  10/13/2015   Performed at [facility] with surgical pathology revealed consistent with benign follicular nodule ([name] category II)    Allergies  Allergen Reactions  . Lisinopril Other (See Comments)    Increased potassium level   . Meloxicam Nausea And Vomiting  . Victoza [Liraglutide] Diarrhea, Nausea And Vomiting and Swelling    Social History   Tobacco Use  . Smoking status: Former Smoker     Packs/day: 0.50    Years: 40.00    Pack years: 20.00    Types: Cigarettes    Quit date: 12/27/2012    Years since quitting: 7.2  . Smokeless tobacco: Never Used  Substance Use Topics  . Alcohol use: No    Alcohol/week: 0.0 standard drinks    Family History  Problem Relation Age of Onset  . Hypertension Mother   . Hyperlipidemia Father   . Hypertension Father   . Heart disease Father        before age 86  . Renal Disease Father   . Diabetes Brother   . Hyperlipidemia Son    Prior to Admission medications   Medication Sig Start Date End Date Taking? Authorizing Provider  aspirin 81 MG EC tablet Take 81 mg by mouth daily. Swallow whole.   Yes [provider]  furosemide (LASIX) 40 MG tablet Take 1 tablet (40 mg total) by mouth daily. Patient taking differently: Take 40 mg by mouth 3 (three) times daily. 01/11/19 01/11/20 Yes Eugenie Filler, MD  amLODipine (NORVASC) 10 MG tablet Take 10 mg by mouth daily. 12/16/19   [provider]  atorvastatin (LIPITOR) 40 MG tablet Take 40 mg by mouth at bedtime. 12/18/19   [provider]  calcitRIOL (ROCALTROL) 0.25 MCG capsule Take 0.25 mcg by mouth every Monday, Tuesday, Wednesday, Thursday, and Friday.    [provider]  cholecalciferol (VITAMIN D3) 25 MCG (1000 UNIT) tablet Take 1,000 Units by mouth daily.    [provider]  gabapentin (NEURONTIN) 300 MG capsule Take 300 mg by mouth 3 (three) times daily.    [provider]  HYDROcodone-acetaminophen (NORCO) 10-325 MG tablet Take 1 tablet by mouth every 6 (six) hours as needed for moderate pain. 01/22/20   [provider]  Insulin Glargine (BASAGLAR KWIKPEN) 100 UNIT/ML Inject 40 Units into the skin at bedtime.    [provider]  insulin lispro (HUMALOG KWIKPEN) 100 UNIT/ML KwikPen Inject 5 Units into the skin See admin instructions. Sliding scale over 150 add 2 units Three times a day    [provider]   methimazole (TAPAZOLE) 5 MG tablet Take 2.5 mg by mouth daily.  06/29/17   [provider]  Multiple Vitamin (MULTIVITAMIN WITH MINERALS) TABS tablet Take 1 tablet by mouth daily.    [provider]  mycophenolate (MYFORTIC) 180 MG EC tablet Take 180 mg by mouth 2 (two) times daily.     [provider]  pantoprazole (PROTONIX) 40 MG tablet Take 1 tablet (40 mg total) by mouth 2 (two) times daily before a meal. 01/27/19   Debbe Odea, MD  predniSONE (DELTASONE) 5 MG tablet Take 1 tablet (5 mg total) by mouth daily with breakfast. 01/17/19  Eugenie Filler, MD  [facility] Wort 300 MG CAPS Take 300 mg by mouth 3 (three) times daily.    [provider]  sulfamethoxazole-trimethoprim (BACTRIM) 400-80 MG tablet Take 1 tablet by mouth every Monday, Wednesday, and Friday. 02/18/19   [provider]  tacrolimus (PROGRAF) 1 MG capsule Take 2 mg by mouth 2 (two) times daily. 06/28/17   [provider]  tamsulosin (FLOMAX) 0.4 MG CAPS capsule Take 0.4 mg by mouth at bedtime.    [provider]  TRADJENTA 5 MG TABS tablet Take 5 mg by mouth daily. 04/17/18   [provider]  vitamin B-12 (CYANOCOBALAMIN) 1000 MCG tablet Take 1,000 mcg by mouth 2 (two) times daily.     [provider]  vitamin C (ASCORBIC ACID) 500 MG tablet Take 500 mg by mouth daily.    [provider]  Zinc 30 MG CAPS Take 30 mg by mouth 2 (two) times daily.    [provider]     Review of Systems  Positive ROS: As above  All other systems have been reviewed and were otherwise negative with the exception of those mentioned in the HPI and as above.  Objective: Vital signs in last 24 hours: Temp:  [98.1 F (36.7 C)] 98.1 F (36.7 C) (03/30 1041) Pulse Rate:  [68] 68 (03/30 1041) Resp:  [17] 17 (03/30 1041) BP: (173)/(81) 173/81 (03/30 1041) SpO2:  [96 %] 96 % (03/30 1041) Weight:  [110.2 kg] 110.2 kg (03/30 1041) Estimated body mass  index is 33.89 kg/m as calculated from the following:   Height as of this encounter: 5\' 11"  (1.803 m).   Weight as of this encounter: 110.2 kg.   General Appearance: Alert Head: Normocephalic, without obvious abnormality, atraumatic Eyes: PERRL, conjunctiva/corneas clear, EOM's intact,    Ears: Normal  Throat: Normal  Neck: Supple, Back: The patient's lumbar incision is well-healed. Lungs: Clear to auscultation bilaterally, respirations unlabored Heart: Regular rate and rhythm, no murmur, rub or gallop Abdomen: Soft, non-tender Extremities: Extremities normal, atraumatic, no cyanosis or edema Skin: unremarkable  NEUROLOGIC:   Mental status: alert and oriented,Motor Exam - grossly normal Sensory Exam - grossly normal Reflexes:  Coordination - grossly normal Gait - grossly normal Balance - grossly normal Cranial Nerves: I: smell Not tested  II: visual acuity  OS: Normal  OD: Normal   II: visual fields Full to confrontation  II: pupils Equal, round, reactive to light  III,VII: ptosis None  III,IV,VI: extraocular muscles  Full ROM  V: mastication Normal  V: facial light touch sensation  Normal  V,VII: corneal reflex  Present  VII: facial muscle function - upper  Normal  VII: facial muscle function - lower Normal  VIII: hearing Not tested  IX: soft palate elevation  Normal  IX,X: gag reflex Present  XI: trapezius strength  5/5  XI: sternocleidomastoid strength 5/5  XI: neck flexion strength  5/5  XII: tongue strength  Normal    Data Review Lab Results  Component Value Date   WBC 8.0 04/08/2020   HGB 11.5 (L) 04/08/2020   HCT 37.4 (L) 04/08/2020   MCV 90.8 04/08/2020   PLT 120 (L) 04/08/2020   Lab Results  Component Value Date   NA 141 01/27/2019   K 4.5 01/27/2019   CL 114 (H) 01/27/2019   CO2 20 (L) 01/27/2019   BUN 27 (H) 01/27/2019   CREATININE 1.83 (H) 01/27/2019   GLUCOSE 184 (H) 01/27/2019   Lab Results  Component Value Date   INR 1.1 01/21/2019     Assessment/Plan: L2-3 spondylolisthesis, spinal stenosis, lumbago, lumbar radiculopathy, neurogenic claudication: I have discussed the situation with the patient and his wife.  I have reviewed his imaging studies with him and pointed out the abnormalities.  We have discussed the various treatment options including surgery.  I have described the surgical treatment option of an L2-3 decompression, instrumentation and fusion.  I have shown him surgical models.  I have given him a surgical pamphlet.  We have discussed the risk, benefits, alternatives, expected postoperative course, and likelihood of achieving our goals with surgery.  I have answered all their questions.  He has decided to proceed with surgery.   Ophelia Charter 04/08/2020 12:11 PM

## 2020-04-08 NOTE — Progress Notes (Signed)
Pt is nauseous.  Spoke with pt and agreed to hold off on CPAP tonight due to the risk of aspiration.

## 2020-04-08 NOTE — Anesthesia Procedure Notes (Signed)
Procedure Name: Intubation Date/Time: 04/08/2020 1:02 PM Performed by: Kyung Rudd, CRNA Pre-anesthesia Checklist: Patient identified, Emergency Drugs available, Suction available and Patient being monitored Patient Re-evaluated:Patient Re-evaluated prior to induction Oxygen Delivery Method: Circle system utilized Preoxygenation: Pre-oxygenation with 100% oxygen Induction Type: IV induction Ventilation: Mask ventilation without difficulty, Oral airway inserted - appropriate to patient size and Two handed mask ventilation required Laryngoscope Size: Mac and 3 Grade View: Grade II Tube type: Oral Tube size: 7.5 mm Number of attempts: 1 Airway Equipment and Method: Stylet Placement Confirmation: ETT inserted through vocal cords under direct vision,  positive ETCO2 and breath sounds checked- equal and bilateral Secured at: 21 cm Tube secured with: Tape Dental Injury: Teeth and Oropharynx as per pre-operative assessment

## 2020-04-08 NOTE — Anesthesia Preprocedure Evaluation (Addendum)
Anesthesia Evaluation  Patient identified by MRN, date of birth, ID band Patient awake    Reviewed: Allergy & Precautions, NPO status , Patient's Chart, lab work & pertinent test results  Airway Mallampati: II  TM Distance: >3 FB Neck ROM: Full    Dental  (+) Edentulous Upper, Edentulous Lower   Pulmonary sleep apnea and Continuous Positive Airway Pressure Ventilation , former smoker,    Pulmonary exam normal        Cardiovascular hypertension, Pt. on medications +CHF   Rhythm:Regular Rate:Normal     Neuro/Psych CVA (2015) negative psych ROS   GI/Hepatic Neg liver ROS, hiatal hernia, PUD, GERD  Medicated,  Endo/Other  diabetes, Type 2, Insulin Dependent  Renal/GU CRFRenal disease (s/p tx 2015)  negative genitourinary   Musculoskeletal  (+) Arthritis , Osteoarthritis,  Spondylolisthesis    Abdominal (+)  Abdomen: soft. Bowel sounds: normal.  Peds  Hematology  (+) anemia ,   Anesthesia Other Findings   Reproductive/Obstetrics                          Anesthesia Physical Anesthesia Plan  ASA: III  Anesthesia Plan: General   Post-op Pain Management:    Induction: Intravenous  PONV Risk Score and Plan: 2 and Ondansetron, Dexamethasone and Midazolam  Airway Management Planned: Mask and Oral ETT  Additional Equipment: None  Intra-op Plan:   Post-operative Plan: Extubation in OR  Informed Consent: I have reviewed the patients History and Physical, chart, labs and discussed the procedure including the risks, benefits and alternatives for the proposed anesthesia with the patient or authorized representative who has indicated his/her understanding and acceptance.     Dental advisory given  Plan Discussed with: CRNA  Anesthesia Plan Comments: (ECHO 02/22: IMPRESSIONS  1. Left ventricular ejection fraction, by estimation, is 60 to 65%. The  left ventricle has normal function. The  left ventricle has no regional  wall motion abnormalities. There is moderate concentric left ventricular  hypertrophy. Left ventricular  diastolic parameters are consistent with Grade I diastolic dysfunction  (impaired relaxation).  2. Right ventricular systolic function is normal. The right ventricular  size is normal. There is normal pulmonary artery systolic pressure.  3. The mitral valve is normal in structure. No evidence of mitral valve  regurgitation. No evidence of mitral stenosis.  4. The aortic valve is normal in structure. Aortic valve regurgitation is  not visualized. Mild aortic valve sclerosis is present, with no evidence  of aortic valve stenosis.  5. Aneurysm of the ascending aorta, measuring 40 mm.  6. The inferior vena cava is normal in size with greater than 50%  respiratory variability, suggesting right atrial pressure of 3 mmHg.   Stress test 02/22:  The left ventricular ejection fraction is normal (55-65%).  Nuclear stress EF: 60%.  There was no ST segment deviation noted during stress.  Defect 1: There is a small defect of mild severity present in the basal inferior and apical lateral location.  This is a low risk study.  No evidence of ischemia. Fixed defects represents attenuation.   )       Anesthesia Quick Evaluation

## 2020-04-08 NOTE — Progress Notes (Signed)
Orthopedic Tech Progress Note Patient Details:  Jon [name] 10/25/1950 [id number] Fitted patient with LSO in PACU Patient ID: [name], male   DOB: 02/20/1950, 70 y.o.   MRN: [id number]   Janit Pagan 04/08/2020, 6:58 PM

## 2020-04-08 NOTE — Op Note (Signed)
Brief history: The patient is a 70 year old white male on whom I previously performed lumbar fusions.  He has done well but recently has developed back and leg pain consistent with neurogenic claudication.  He has failed medical management and was worked up with a lumbar MRI and lumbar x-rays which demonstrated an L2-3 spondylolisthesis with spinal stenosis.  I discussed the various treatment options with him.  He has decided proceed with surgery.  Preoperative diagnosis: L2-3 lumbar adjacent disease with spondylolisthesis, degenerative disc disease, spinal stenosis compressing both the L2 and the L3 nerve roots; lumbago; lumbar radiculopathy; neurogenic claudication  Postoperative diagnosis: The same  Procedure: Bilateral L2-3 laminotomy/foraminotomies/medial facetectomy to decompress the bilateral L2 and L3 nerve roots(the work required to do this was in addition to the work required to do the posterior lumbar interbody fusion because of the patient's spinal stenosis, facet arthropathy. Etc. requiring a wide decompression of the nerve roots.);  L2-3 transforaminal lumbar interbody fusion with local morselized autograft bone and Zimmer DBM; insertion of interbody prosthesis at L2-3 (globus peek expandable interbody prosthesis); posterior nonsegmental instrumentation from L2 to L3 with globus titanium pedicle screws and rods; posterior lateral arthrodesis at L2-3 with local morselized autograft bone and Zimmer DBM; exploration of lumbar fusion/removal of old lumbar hardware at L3-4.  Surgeon: Dr. Earle Gell  Asst.: Arnetha Massy, NP  Anesthesia: Gen. endotracheal  Estimated blood loss: 250 cc  Drains: None  Complications: None  Description of procedure: The patient was brought to the operating room by the anesthesia team. General endotracheal anesthesia was induced. The patient was turned to the prone position on the Wilson frame. The patient's lumbosacral region was then prepared with Betadine  scrub and Betadine solution. Sterile drapes were applied.  I then injected the area to be incised with Marcaine with epinephrine solution. I then used the scalpel to make a linear midline incision over the L2-3 interspace, incising through the old surgical scar. I then used electrocautery to perform a bilateral subperiosteal dissection exposing the spinous process and lamina of L2 and L3 and exposing the old hardware at L3-4. We then obtained intraoperative radiograph to confirm our location. We then inserted the Verstrac retractor to provide exposure.  I explored the fusion by removing the caps from the screws at L3 and L4 and then removing the rods.  I inspected the arthrodesis at L3-4.  It appeared solid.  I removed the L3 and L4 pedicle screws.  I began the decompression by using the high speed drill to perform laminotomies at L2-3 bilaterally. We then used the Kerrison punches to widen the laminotomy and removed the ligamentum flavum at L2-3 bilaterally. We used the Kerrison punches to remove the medial facets at L2-3 bilaterally. We performed wide foraminotomies about the bilateral L2 and L3 nerve roots completing the decompression.  We now turned our attention to the posterior lumbar interbody fusion. I used a scalpel to incise the intervertebral disc at L2-3 bilaterally. I then performed a partial intervertebral discectomy at L2-3 bilaterally using the pituitary forceps. We prepared the vertebral endplates at G3-8 bilaterally for the fusion by removing the soft tissues with the curettes. We then used the trial spacers to pick the appropriate sized interbody prosthesis. We prefilled his prosthesis with a combination of local morselized autograft bone that we obtained during the decompression as well as Zimmer DBM. We inserted the prefilled prosthesis into the interspace at L2-3, we then turned and expanded the prosthesis. There was a good snug fit of the prosthesis  in the interspace. We then filled  and the remainder of the intervertebral disc space with local morselized autograft bone and Zimmer DBM. This completed the posterior lumbar interbody arthrodesis.  During the decompression and insertion of the prosthesis the assistant protected the thecal sac and nerve roots with the D'Errico retractor.  We now turned attention to the instrumentation. Under fluoroscopic guidance we cannulated the bilateral L2 pedicles with the bone probe. We then removed the bone probe. We then tapped the pedicle with a 6.5 millimeter tap. We then removed the tap. We probed inside the tapped pedicle with a ball probe to rule out cortical breaches. We then inserted a 7.5 x 55 millimeter pedicle screw into the L2 pedicles bilaterally under fluoroscopic guidance.  We used the old screw holes at L3 and placed bilateral 7.5 x 55 mm pedicle screws into the L3 pedicles.  We then palpated along the medial aspect of the pedicles to rule out cortical breaches. There were none. The nerve roots were not injured. We then connected the unilateral pedicle screws with a lordotic rod. We compressed the construct and secured the rod in place with the caps. We then tightened the caps appropriately. This completed the instrumentation from L2 to L3 bilaterally.  We now turned our attention to the posterior lateral arthrodesis at L2-3. We used the high-speed drill to decorticate the remainder of the facets, pars, transverse process at L2-3. We then applied a combination of local morselized autograft bone and Zimmer DBM over these decorticated posterior lateral structures. This completed the posterior lateral arthrodesis.  We then obtained hemostasis using bipolar electrocautery. We irrigated the wound out with bacitracin solution. We inspected the thecal sac and nerve roots and noted they were well decompressed. We then removed the retractor.  We injected Exparel . We reapproximated patient's thoracolumbar fascia with interrupted #1 Vicryl suture.  We reapproximated patient's subcutaneous tissue with interrupted 2-0 Vicryl suture. The reapproximated patient's skin with Steri-Strips and benzoin. The wound was then coated with bacitracin ointment. A sterile dressing was applied. The drapes were removed. The patient was subsequently returned to the supine position where they were extubated by the anesthesia team. He was then transported to the post anesthesia care unit in stable condition. All sponge instrument and needle counts were reportedly correct at the end of this case.

## 2020-04-08 NOTE — Progress Notes (Signed)
Pt. Nor wife knows what medications he took day of surgery.

## 2020-04-08 NOTE — Transfer of Care (Signed)
Immediate Anesthesia Transfer of Care Note  Patient: Jon [name]  Procedure(s) Performed: POSTERIOR LUMBAR INTERBODY FUSION, INTERBODY PROSTHESIS, POSTERIOR INSTRUMENTATION LUMBAR TWO-LUMBAR THREE; EXPLORE FUSION (N/A )  Patient Location: PACU  Anesthesia Type:General  Level of Consciousness: awake and drowsy  Airway & Oxygen Therapy: Patient Spontanous Breathing and Patient connected to nasal cannula oxygen  Post-op Assessment: Report given to RN and Post -op Vital signs reviewed and stable  Post vital signs: Reviewed and stable  Last Vitals:  Vitals Value Taken Time  BP 139/69 04/08/20 1707  Temp    Pulse 61 04/08/20 1710  Resp 23 04/08/20 1710  SpO2 98 % 04/08/20 1710  Vitals shown include unvalidated device data.  Last Pain:  Vitals:   04/08/20 1137  TempSrc:   PainSc: 6       Patients Stated Pain Goal: 3 (14/10/30 1314)  Complications: No complications documented.

## 2020-04-09 LAB — GLUCOSE, CAPILLARY: Glucose-Capillary: 204 mg/dL — ABNORMAL HIGH (ref 70–99)

## 2020-04-09 LAB — BASIC METABOLIC PANEL
Anion gap: 8 (ref 5–15)
BUN: 29 mg/dL — ABNORMAL HIGH (ref 8–23)
CO2: 23 mmol/L (ref 22–32)
Calcium: 8.4 mg/dL — ABNORMAL LOW (ref 8.9–10.3)
Chloride: 108 mmol/L (ref 98–111)
Creatinine, Ser: 2.06 mg/dL — ABNORMAL HIGH (ref 0.61–1.24)
GFR, Estimated: 34 mL/min — ABNORMAL LOW (ref >60)
Glucose, Bld: 208 mg/dL — ABNORMAL HIGH (ref 70–99)
Potassium: 4.9 mmol/L (ref 3.5–5.1)
Sodium: 139 mmol/L (ref 135–145)

## 2020-04-09 LAB — CBC
HCT: 34 % — ABNORMAL LOW (ref 39.0–52.0)
Hemoglobin: 10.7 g/dL — ABNORMAL LOW (ref 13.0–17.0)
MCH: 28.6 pg (ref 26.0–34.0)
MCHC: 31.5 g/dL (ref 30.0–36.0)
MCV: 90.9 fL (ref 80.0–100.0)
Platelets: 112 10*3/uL — ABNORMAL LOW (ref 150–400)
RBC: 3.74 MIL/uL — ABNORMAL LOW (ref 4.22–5.81)
RDW: 14.6 % (ref 11.5–15.5)
WBC: 10.4 10*3/uL (ref 4.0–10.5)
nRBC: 0 % (ref 0.0–0.2)

## 2020-04-09 MED ORDER — CYCLOBENZAPRINE HCL 10 MG PO TABS: 10.0000 mg | ORAL_TABLET | Freq: Three times a day (TID) | ORAL | 0 refills | Status: DC | PRN

## 2020-04-09 MED ORDER — DOCUSATE SODIUM 100 MG PO CAPS: 100.0000 mg | ORAL_CAPSULE | Freq: Two times a day (BID) | ORAL | 0 refills | Status: DC

## 2020-04-09 MED ORDER — ONDANSETRON HCL 4 MG PO TABS
4.0000 mg | ORAL_TABLET | ORAL | Status: DC | PRN
  Administered 2020-04-09: 4 mg via ORAL
  Filled 2020-04-09: qty 1

## 2020-04-09 MED ORDER — ONDANSETRON HCL 4 MG/2ML IJ SOLN
4.0000 mg | INTRAMUSCULAR | Status: DC | PRN
  Administered 2020-04-09: 4 mg via INTRAVENOUS

## 2020-04-09 MED ORDER — HYDROCODONE-ACETAMINOPHEN 10-325 MG PO TABS: 1.0000 | ORAL_TABLET | ORAL | 0 refills | Status: DC | PRN

## 2020-04-09 MED ORDER — HYDROXYZINE HCL 50 MG/ML IM SOLN
50.0000 mg | Freq: Four times a day (QID) | INTRAMUSCULAR | Status: DC | PRN
  Administered 2020-04-09: 50 mg via INTRAMUSCULAR
  Filled 2020-04-09: qty 1

## 2020-04-09 MED FILL — Thrombin For Soln 5000 Unit: CUTANEOUS | Qty: 5000 | Status: AC

## 2020-04-09 NOTE — Anesthesia Postprocedure Evaluation (Signed)
Anesthesia Post Note  Patient: BASSEM BERNASCONI  Procedure(s) Performed: POSTERIOR LUMBAR INTERBODY FUSION, INTERBODY PROSTHESIS, POSTERIOR INSTRUMENTATION LUMBAR TWO-LUMBAR THREE; EXPLORE FUSION (N/A )     Patient location during evaluation: PACU Anesthesia Type: General Level of consciousness: awake and alert Pain management: pain level controlled Vital Signs Assessment: post-procedure vital signs reviewed and stable Respiratory status: spontaneous breathing, nonlabored ventilation, respiratory function stable and patient connected to nasal cannula oxygen Cardiovascular status: blood pressure returned to baseline and stable Postop Assessment: no apparent nausea or vomiting Anesthetic complications: no   No complications documented.  Last Vitals:  Vitals:   04/09/20 0340 04/09/20 0743  BP: (!) 164/74 137/66  Pulse: 82 93  Resp: 20 18  Temp: 37.1 C 36.8 C  SpO2: 93% 93%    Last Pain:  Vitals:   04/09/20 0743  TempSrc: Oral  PainSc:                  March Rummage Bonna Steury

## 2020-04-09 NOTE — Evaluation (Signed)
Physical Therapy Evaluation Patient Details Name: Jon [name] MRN: [id number] DOB: [birth year] Today's Date: 04/09/2020   History of Present Illness  The pt is a 70 yo male presenting 3/30 for laminotomy/foraminotomy of L2-3 in addition to TLIF due to ongoing back and leg pain. PMH includes: CVA, CKD s/p transplant, DM II, GERD, HLD, HTN, obesity, and multiple spinal fusions.    Clinical Impression  Pt in bed upon arrival of PT, agreeable to evaluation at this time. Prior to admission the pt was mobilizing with use of SPC for short distances and power WC for longer distances due to pain in low back and bilateral hips/thighs. The pt now presents with limitations in functional mobility, endurance, strength, and dynamic stability due to above dx, but is safe to return home with family support/assist as needed when medically cleared for d/c. The pt's mobility is significantly improved from prior to surgery, as he was able to ambulate 11ft with use of RW and minG at time of eval. He was able to verbalize spinal precautions, and adhere to precautions with mobility. The pt is highly motivated to return to prior level of mobility and decreased need for AD, and was educated on progressive walking program. The pt has good support, access to DME, and understanding of precautions from prior surgical intervention. Discussed possible need for OPPT if pt does not feel able to adequately progress walking distance or reduce reliance on AD as anticipated. Pt will continue to benefit from skilled PT acutely to progress functional strength and power in transfers, but is safe to d/c home with family when cleared.      Follow Up Recommendations No PT follow up;Supervision for mobility/OOB    Equipment Recommendations  None recommended by PT (pt well equipped)    Recommendations for Other Services       Precautions / Restrictions Precautions Precautions: Back Precaution Booklet Issued: Yes (comment) Precaution  Comments: pt able to verbalize 3/3, discussed application of precautions to movement Required Braces or Orthoses: Spinal Brace Spinal Brace: Lumbar corset;Applied in sitting position Restrictions Weight Bearing Restrictions: No      Mobility  Bed Mobility Overal bed mobility: Modified Independent             General bed mobility comments: pt sitting EOB upon arrival of PT, discussed log roll technique    Transfers Overall transfer level: Needs assistance Equipment used: Rolling walker (2 wheeled) Transfers: Sit to/from Stand Sit to Stand: Min guard         General transfer comment: increased time and effort, but able to power up without assist, BUE support on RW in standing  Ambulation/Gait Ambulation/Gait assistance: Min guard Gait Distance (Feet): 150 Feet Assistive device: Rolling walker (2 wheeled) Gait Pattern/deviations: Step-through pattern;Decreased stride length;Trunk flexed Gait velocity: 0.36 m/s Gait velocity interpretation: <1.31 ft/sec, indicative of household ambulator General Gait Details: pt with slow but steady gait with use of RW for BUE support. No LOB, foot drop, or buckling with gait. pt reports fatigue> pain      Balance Overall balance assessment: Needs assistance Sitting-balance support: No upper extremity supported;Feet supported Sitting balance-Leahy Scale: Good     Standing balance support: Bilateral upper extremity supported;During functional activity Standing balance-Leahy Scale: Fair Standing balance comment: static stance without UE support, BUE support during gait                             Pertinent Vitals/Pain Pain Assessment: 0-10  Pain Score: 3  Pain Location: incision site Pain Descriptors / Indicators: Discomfort;Sore Pain Intervention(s): Monitored during session;Repositioned    Home Living Family/patient expects to be discharged to:: Private residence Living Arrangements: Spouse/significant  other Available Help at Discharge: Family;Available 24 hours/day Type of Home: House Home Access: Ramped entrance     Home Layout: Two level;Laundry or work area in basement;Able to live on main level with bedroom/bathroom Home Equipment: Environmental consultant - 2 wheels;Walker - 4 wheels;Cane - single point;Shower seat;Grab bars - toilet;Grab bars - tub/shower;Wheelchair - power      Prior Function Level of Independence: Independent with assistive device(s)         Comments: independent with use of SPC for short distances, power WC for longer or community distances. Limited by pain. independent with ADLs other than socks     Hand Dominance   Dominant Hand: Right    Extremity/Trunk Assessment   Upper Extremity Assessment Upper Extremity Assessment: Defer to OT evaluation    Lower Extremity Assessment Lower Extremity Assessment: LLE deficits/detail;Overall [facility] for tasks assessed LLE Deficits / Details: grossly 4-/5, but able to use functionally with gait and transfers, no foot drop or buckling noted with gait. pt denies difference in sensation LLE Sensation: WNL    Cervical / Trunk Assessment Cervical / Trunk Assessment: Normal  Communication   Communication: No difficulties  Cognition Arousal/Alertness: Awake/alert Behavior During Therapy: WFL for tasks assessed/performed Overall Cognitive Status: Within Functional Limits for tasks assessed                                        General Comments General comments (skin integrity, edema, etc.): VSS    Exercises     Assessment/Plan    PT Assessment Patient needs continued PT services  PT Problem List Decreased strength;Decreased balance;Decreased mobility;Pain       PT Treatment Interventions DME instruction;Gait training;Stair training;Functional mobility training;Therapeutic activities;Therapeutic exercise;Balance training;Patient/family education    PT Goals (Current goals can be found in the Care Plan  section)  Acute Rehab PT Goals Patient Stated Goal: walk 200 ft on gravel path with cane to his workshop PT Goal Formulation: With patient Time For Goal Achievement: 04/23/20 Potential to Achieve Goals: Good    Frequency Min 5X/week    AM-PAC PT "6 Clicks" Mobility  Outcome Measure Help needed turning from your back to your side while in a flat bed without using bedrails?: None Help needed moving from lying on your back to sitting on the side of a flat bed without using bedrails?: None Help needed moving to and from a bed to a chair (including a wheelchair)?: A Little Help needed standing up from a chair using your arms (e.g., wheelchair or bedside chair)?: A Little Help needed to walk in hospital room?: A Little Help needed climbing 3-5 steps with a railing? : A Little 6 Click Score: 20    End of Session Equipment Utilized During Treatment: Back brace Activity Tolerance: Patient tolerated treatment well Patient left: in bed;with call bell/phone within reach (sitting EOB) Nurse Communication: Mobility status PT Visit Diagnosis: Other abnormalities of gait and mobility (R26.89)    Time: 1761-6073 PT Time Calculation (min) (ACUTE ONLY): 25 min   Charges:   PT Evaluation $PT Eval Low Complexity: 1 Low PT Treatments $Gait Training: 8-22 mins        Karma Ganja, PT, DPT   Acute Rehabilitation Department Pager #: [phone]  Otho Bellows 04/09/2020, 9:21 AM

## 2020-04-09 NOTE — Discharge Instructions (Addendum)
Wound Care Keep incision covered and dry for 3 days.    Do not put any creams, lotions, or ointments on incision. Leave steri-strips on back.  They will fall off by themselves.  Activity Walk each and every day, increasing distance each day. No lifting greater than 5 lbs. No driving for 2 weeks; may ride as a passenger locally.  Diet Resume your normal diet.   Return to Work Will be discussed at your follow up appointment.  Call Your Doctor If Any of These Occur Redness, drainage, or swelling at the wound.  Temperature greater than 101 degrees. Severe pain not relieved by pain medication. Incision starts to come apart.  Follow Up Appt Call today for appointment in 2-3 weeks ([phone]) or for problems.  If you have any hardware placed in your spine, you will need an x-ray before your appointment.  

## 2020-04-09 NOTE — Discharge Summary (Signed)
Physician Discharge Summary     Providing Compassionate, Quality Care - Together   Patient ID: Jon [name] MRN: [id number] DOB/AGE: 09-11-1950 70 y.o.  Admit date: 04/08/2020 Discharge date: 04/09/2020  Admission Diagnoses: Lumbar adjacent segment disease with spondylolisthesis  Discharge Diagnoses:  Active Problems:   Lumbar adjacent segment disease with spondylolisthesis   Discharged Condition: good  Hospital Course: Patient underwent an L2-3 posterior lumbar fusion  by Dr. Arnoldo Morale on 04/08/2020. He was admitted to 3C02 following recovery from anesthesia in the PACU. His postoperative course has been uncomplicated. He has worked with both physical and occupational therapies, who feel the patient is ready for discharge home. He is ambulating independently and without difficulty. He is tolerating a normal diet. He is not having any bowel or bladder dysfunction. His pain is well-controlled with oral pain medication. He is ready for discharge home.   Consults: None  Significant Diagnostic Studies: radiology: DG Lumbar Spine 2-3 Views  Result Date: 04/08/2020 CLINICAL DATA:  Provided history: L2-3 PLIF extension. Provided fluoroscopy time 31 seconds (29.13 mGy). EXAM: LUMBAR SPINE - 2-3 VIEW; DG C-ARM 1-60 MIN COMPARISON:  MRI of the lumbar spine 01/08/2020. Lumbar spine radiographs 12/17/2019. FINDINGS: AP and lateral view intraoperative fluoroscopic images of the lumbar spine are submitted, 2 images total. The images demonstrate bilateral pedicle screws at L2 and L3. Vertical interconnecting rods were not present at the time the images were taken. An interbody device is also now present at L2-L3. Overlying retractors. Redemonstrated pre-existing interbody device at L3-L4. Partially imaged pre-existing posterior spinal fusion construct more caudally within the lumbar spine. IMPRESSION: Two intraoperative fluoroscopic images of the lumbar spine from L2-L3 fusion, as described.  Electronically Signed   By: Kellie Simmering DO   On: 04/08/2020 17:22   DG Lumbar Spine 1 View  Result Date: 04/08/2020 CLINICAL DATA:  Elective surgery. Additional history provided: Localization film for L2-3 PLIF. EXAM: LUMBAR SPINE - 1 VIEW COMPARISON:  Lumbar spine MRI 01/08/2020. Lumbar spine radiographs 12/17/2019. FINDINGS: A single lateral view intraoperative localizer radiograph of the lumbosacral spine is submitted. On the provided image, a surgical instrument and retractors project posterior to the lumbar spine at the L2-L3 level. Redemonstrated posterior spinal fusion constructs at L3-L4 and L5-S1. L2-L3 grade 1 retrolisthesis, unchanged. IMPRESSION: Single lateral view intraoperative localizer radiograph of the lumbosacral spine, as described. Electronically Signed   By: Kellie Simmering DO   On: 04/08/2020 17:26   DG C-Arm 1-60 Min  Result Date: 04/08/2020 CLINICAL DATA:  Provided history: L2-3 PLIF extension. Provided fluoroscopy time 31 seconds (29.13 mGy). EXAM: LUMBAR SPINE - 2-3 VIEW; DG C-ARM 1-60 MIN COMPARISON:  MRI of the lumbar spine 01/08/2020. Lumbar spine radiographs 12/17/2019. FINDINGS: AP and lateral view intraoperative fluoroscopic images of the lumbar spine are submitted, 2 images total. The images demonstrate bilateral pedicle screws at L2 and L3. Vertical interconnecting rods were not present at the time the images were taken. An interbody device is also now present at L2-L3. Overlying retractors. Redemonstrated pre-existing interbody device at L3-L4. Partially imaged pre-existing posterior spinal fusion construct more caudally within the lumbar spine. IMPRESSION: Two intraoperative fluoroscopic images of the lumbar spine from L2-L3 fusion, as described. Electronically Signed   By: Kellie Simmering DO   On: 04/08/2020 17:22     Treatments: surgery: Bilateral L2-3 laminotomy/foraminotomies/medial facetectomy to decompress the bilateral L2 and L3 nerve roots(the work required to do  this was in addition to the work required to do the posterior lumbar interbody  fusion because of the patient's spinal stenosis, facet arthropathy. Etc. requiring a wide decompression of the nerve roots.);  L2-3 transforaminal lumbar interbody fusion with local morselized autograft bone and Zimmer DBM; insertion of interbody prosthesis at L2-3 (globus peek expandable interbody prosthesis); posterior nonsegmental instrumentation from L2 to L3 with globus titanium pedicle screws and rods; posterior lateral arthrodesis at L2-3 with local morselized autograft bone and Zimmer DBM; exploration of lumbar fusion/removal of old lumbar hardware at L3-4.  Discharge Exam: Blood pressure 137/66, pulse 93, temperature 98.2 F (36.8 C), temperature source Oral, resp. rate 18, height 5\' 11"  (1.803 m), weight 110.2 kg, SpO2 93 %.  Alert and oriented x 4 PERRLA CN II-XII grossly intact MAE, Strength and sensation intact Incision is covered with Honeycomb dressing and Steri Strips; Dressing is dry and intact, with a small amount of dried sanguinous drainage   Disposition: Discharge disposition: 01-Home or Self Care        Allergies as of 04/09/2020      Reactions   Lisinopril Other (See Comments)   Increased potassium level   Meloxicam Nausea And Vomiting   Victoza [liraglutide] Diarrhea, Nausea And Vomiting, Swelling      Medication List    TAKE these medications   amLODipine 10 MG tablet Commonly known as: NORVASC Take 10 mg by mouth daily.   aspirin 81 MG EC tablet Take 81 mg by mouth daily. Swallow whole.   atorvastatin 40 MG tablet Commonly known as: LIPITOR Take 40 mg by mouth at bedtime.   Basaglar KwikPen 100 UNIT/ML Inject 40 Units into the skin at bedtime.   calcitRIOL 0.25 MCG capsule Commonly known as: ROCALTROL Take 0.25 mcg by mouth every Monday, Tuesday, Wednesday, Thursday, and Friday.   cholecalciferol 25 MCG (1000 UNIT) tablet Commonly known as: VITAMIN D3 Take 1,000  Units by mouth daily.   cyclobenzaprine 10 MG tablet Commonly known as: FLEXERIL Take 1 tablet (10 mg total) by mouth 3 (three) times daily as needed for muscle spasms.   docusate sodium 100 MG capsule Commonly known as: COLACE Take 1 capsule (100 mg total) by mouth 2 (two) times daily.   furosemide 40 MG tablet Commonly known as: Lasix Take 1 tablet (40 mg total) by mouth daily. What changed: when to take this   gabapentin 300 MG capsule Commonly known as: NEURONTIN Take 300 mg by mouth 3 (three) times daily.   HumaLOG KwikPen 100 UNIT/ML KwikPen Generic drug: insulin lispro Inject 5 Units into the skin See admin instructions. Sliding scale over 150 add 2 units Three times a day   HYDROcodone-acetaminophen 10-325 MG tablet Commonly known as: NORCO Take 1-2 tablets by mouth every 4 (four) hours as needed for moderate pain. What changed:   how much to take  when to take this   methimazole 5 MG tablet Commonly known as: TAPAZOLE Take 2.5 mg by mouth daily.   multivitamin with minerals Tabs tablet Take 1 tablet by mouth daily.   mycophenolate 180 MG EC tablet Commonly known as: MYFORTIC Take 180 mg by mouth 2 (two) times daily.   pantoprazole 40 MG tablet Commonly known as: PROTONIX Take 1 tablet (40 mg total) by mouth 2 (two) times daily before a meal.   predniSONE 5 MG tablet Commonly known as: DELTASONE Take 1 tablet (5 mg total) by mouth daily with breakfast.   St Johns Wort 300 MG Caps Take 300 mg by mouth 3 (three) times daily.   sulfamethoxazole-trimethoprim 400-80 MG tablet Commonly known as:  BACTRIM Take 1 tablet by mouth every Monday, Wednesday, and Friday.   tacrolimus 1 MG capsule Commonly known as: PROGRAF Take 2 mg by mouth 2 (two) times daily.   tamsulosin 0.4 MG Caps capsule Commonly known as: FLOMAX Take 0.4 mg by mouth at bedtime.   Tradjenta 5 MG Tabs tablet Generic drug: linagliptin Take 5 mg by mouth daily.   vitamin B-12 1000  MCG tablet Commonly known as: CYANOCOBALAMIN Take 1,000 mcg by mouth 2 (two) times daily.   vitamin C 500 MG tablet Commonly known as: ASCORBIC ACID Take 500 mg by mouth daily.   Zinc 30 MG Caps Take 30 mg by mouth 2 (two) times daily.       Follow-up Information    Newman Pies, MD. Go in 3 week(s).   Specialty: Neurosurgery Contact information: [address] [place] [address] [phone]               Signed: Viona Gilmore, DNP, AGNP-C Nurse Practitioner  [facility] [place] [address], [place], [place] [address] P: [phone]    F: [phone]  04/09/2020, 11:34 AM

## 2020-04-09 NOTE — Evaluation (Signed)
Occupational Therapy Evaluation Patient Details Name: Jon [name] MRN: [id number] DOB: 05-11-1950 Today's Date: 04/09/2020    History of Present Illness The pt is a 70 yo male presenting 3/30 for laminotomy/foraminotomy of L2-3 in addition to TLIF due to ongoing back and leg pain. PMH includes: CVA, CKD s/p transplant, DM II, GERD, HLD, HTN, obesity, and multiple spinal fusions.   Clinical Impression   Patient admitted for the above diagnosis and procedure above.  Spouse will be able to assist as needed.  Has pieces of the hip kit, and was able to demonstrate good use to follow back precautions.  No further acute or post acute needs identified.  All questions answered.      Follow Up Recommendations  No OT follow up    Equipment Recommendations  None recommended by OT    Recommendations for Other Services       Precautions / Restrictions Precautions Precautions: Back Precaution Booklet Issued: Yes (comment) Precaution Comments: verbal cues as needed. Required Braces or Orthoses: Spinal Brace Spinal Brace: Lumbar corset;Applied in sitting position Restrictions Weight Bearing Restrictions: No Other Position/Activity Restrictions: brace is not fitting well, but he has another one he can use.      Mobility Bed Mobility Overal bed mobility: Modified Independent             General bed mobility comments: pt sitting EOB upon arrival of PT, discussed log roll technique    Transfers Overall transfer level: Modified independent Equipment used: Rolling walker (2 wheeled) Transfers: Sit to/from Stand Sit to Stand: Min guard         General transfer comment: increased time and effort, but able to power up without assist, BUE support on RW in standing    Balance Overall balance assessment: Mild deficits observed, not formally tested Sitting-balance support: No upper extremity supported;Feet supported Sitting balance-Leahy Scale: Good     Standing balance support:  Bilateral upper extremity supported;During functional activity Standing balance-Leahy Scale: Fair Standing balance comment: static stance without UE support, BUE support during gait                           ADL either performed or assessed with clinical judgement   ADL Overall ADL's : Modified independent                                       General ADL Comments: hip kit at sit/stand level     Vision Baseline Vision/History: Wears glasses Patient Visual Report: No change from baseline       Perception     Praxis      Pertinent Vitals/Pain Pain Assessment: Faces Pain Score: 3  Faces Pain Scale: Hurts a little bit Pain Location: incision site Pain Descriptors / Indicators: Tender Pain Intervention(s): Monitored during session     Hand Dominance Right   Extremity/Trunk Assessment Upper Extremity Assessment Upper Extremity Assessment: Overall WFL for tasks assessed   Lower Extremity Assessment Lower Extremity Assessment: Defer to PT evaluation    Cervical / Trunk Assessment Cervical / Trunk Assessment: Normal   Communication Communication Communication: No difficulties   Cognition Arousal/Alertness: Awake/alert Behavior During Therapy: WFL for tasks assessed/performed Overall Cognitive Status: Within Functional Limits for tasks assessed  General Comments  VSS    Exercises     Shoulder Instructions      Home Living Family/patient expects to be discharged to:: Private residence Living Arrangements: Spouse/significant other Available Help at Discharge: Family;Available 24 hours/day Type of Home: House Home Access: Ramped entrance     Home Layout: Two level;Laundry or work area in basement;Able to live on main level with bedroom/bathroom Alternate Therapist, sports of Steps: flight (pt typically rides motorized WC outside to door where it is level entry to basement)    Bathroom Shower/Tub: Tub/shower unit   Bathroom Toilet: Handicapped height     Home Equipment: Environmental consultant - 2 wheels;Walker - 4 wheels;Cane - single point;Shower seat;Grab bars - toilet;Grab bars - tub/shower;Wheelchair - power          Prior Functioning/Environment Level of Independence: Independent with assistive device(s)        Comments: independent with use of SPC for short distances, power WC for longer or community distances. Limited by pain. independent with ADLs other than socks        OT Problem List: Pain;Impaired balance (sitting and/or standing)      OT Treatment/Interventions:      OT Goals(Current goals can be found in the care plan section) Acute Rehab OT Goals Patient Stated Goal: start making my furniture again OT Goal Formulation: With patient Time For Goal Achievement: 04/09/20 Potential to Achieve Goals: Good  OT Frequency:     Barriers to D/C:  none noted          Co-evaluation              AM-PAC OT "6 Clicks" Daily Activity     Outcome Measure Help from another person eating meals?: None Help from another person taking care of personal grooming?: None Help from another person toileting, which includes using toliet, bedpan, or urinal?: None Help from another person bathing (including washing, rinsing, drying)?: None Help from another person to put on and taking off regular upper body clothing?: None Help from another person to put on and taking off regular lower body clothing?: None 6 Click Score: 24   End of Session Equipment Utilized During Treatment: Back brace Nurse Communication: Mobility status  Activity Tolerance: Patient tolerated treatment well Patient left: in bed;with call bell/phone within reach  OT Visit Diagnosis: Unsteadiness on feet (R26.81);Pain                Time: [id number] OT Time Calculation (min): 25 min Charges:  OT General Charges $OT Visit: 1 Visit OT Evaluation $OT Eval Moderate Complexity: 1 Mod OT  Treatments $Self Care/Home Management : 8-22 mins  04/09/2020  Rich, OTR/L  Acute Rehabilitation Services  Office:  [place] 04/09/2020, 9:31 AM

## 2020-04-10 ENCOUNTER — Telehealth: Payer: Self-pay | Admitting: Oncology

## 2020-04-10 NOTE — Telephone Encounter (Signed)
04/10/20 All appts rescheduled per patient request

## 2020-04-12 MED FILL — Sodium Chloride IV Soln 0.9%: INTRAVENOUS | Qty: 1000 | Status: AC

## 2020-04-12 MED FILL — Heparin Sodium (Porcine) Inj 1000 Unit/ML: INTRAMUSCULAR | Qty: 30 | Status: AC

## 2020-04-22 ENCOUNTER — Other Ambulatory Visit: Payer: Self-pay

## 2020-04-22 DIAGNOSIS — D631 Anemia in chronic kidney disease: Secondary | ICD-10-CM | POA: Insufficient documentation

## 2020-04-22 DIAGNOSIS — Z9289 Personal history of other medical treatment: Secondary | ICD-10-CM | POA: Insufficient documentation

## 2020-04-22 DIAGNOSIS — Z86010 Personal history of colonic polyps: Secondary | ICD-10-CM | POA: Insufficient documentation

## 2020-04-22 DIAGNOSIS — E785 Hyperlipidemia, unspecified: Secondary | ICD-10-CM | POA: Insufficient documentation

## 2020-04-22 DIAGNOSIS — N189 Chronic kidney disease, unspecified: Secondary | ICD-10-CM | POA: Insufficient documentation

## 2020-04-22 DIAGNOSIS — G8929 Other chronic pain: Secondary | ICD-10-CM | POA: Insufficient documentation

## 2020-04-22 DIAGNOSIS — D638 Anemia in other chronic diseases classified elsewhere: Secondary | ICD-10-CM | POA: Insufficient documentation

## 2020-04-22 DIAGNOSIS — I10 Essential (primary) hypertension: Secondary | ICD-10-CM | POA: Insufficient documentation

## 2020-04-22 DIAGNOSIS — R351 Nocturia: Secondary | ICD-10-CM | POA: Insufficient documentation

## 2020-04-22 DIAGNOSIS — E669 Obesity, unspecified: Secondary | ICD-10-CM | POA: Insufficient documentation

## 2020-04-22 DIAGNOSIS — R0600 Dyspnea, unspecified: Secondary | ICD-10-CM | POA: Insufficient documentation

## 2020-04-22 DIAGNOSIS — R011 Cardiac murmur, unspecified: Secondary | ICD-10-CM | POA: Insufficient documentation

## 2020-04-22 DIAGNOSIS — G4730 Sleep apnea, unspecified: Secondary | ICD-10-CM | POA: Insufficient documentation

## 2020-04-22 DIAGNOSIS — R269 Unspecified abnormalities of gait and mobility: Secondary | ICD-10-CM | POA: Insufficient documentation

## 2020-04-22 DIAGNOSIS — I259 Chronic ischemic heart disease, unspecified: Secondary | ICD-10-CM | POA: Insufficient documentation

## 2020-04-22 DIAGNOSIS — Z87442 Personal history of urinary calculi: Secondary | ICD-10-CM | POA: Insufficient documentation

## 2020-04-22 DIAGNOSIS — M1990 Unspecified osteoarthritis, unspecified site: Secondary | ICD-10-CM | POA: Insufficient documentation

## 2020-04-22 DIAGNOSIS — R350 Frequency of micturition: Secondary | ICD-10-CM | POA: Insufficient documentation

## 2020-04-22 DIAGNOSIS — Z8719 Personal history of other diseases of the digestive system: Secondary | ICD-10-CM | POA: Insufficient documentation

## 2020-04-22 DIAGNOSIS — K922 Gastrointestinal hemorrhage, unspecified: Secondary | ICD-10-CM | POA: Insufficient documentation

## 2020-04-22 DIAGNOSIS — I509 Heart failure, unspecified: Secondary | ICD-10-CM | POA: Insufficient documentation

## 2020-04-23 ENCOUNTER — Other Ambulatory Visit: Payer: Self-pay

## 2020-04-23 ENCOUNTER — Encounter: Payer: Self-pay | Admitting: Cardiology

## 2020-04-23 ENCOUNTER — Ambulatory Visit: Payer: Medicare Other | Admitting: Cardiology

## 2020-04-23 VITALS — BP 140/62 | HR 91 | Ht 71.0 in | Wt 237.0 lb

## 2020-04-23 DIAGNOSIS — G4733 Obstructive sleep apnea (adult) (pediatric): Secondary | ICD-10-CM

## 2020-04-23 DIAGNOSIS — Z794 Long term (current) use of insulin: Secondary | ICD-10-CM | POA: Diagnosis not present

## 2020-04-23 DIAGNOSIS — I10 Essential (primary) hypertension: Secondary | ICD-10-CM

## 2020-04-23 DIAGNOSIS — I712 Thoracic aortic aneurysm, without rupture: Secondary | ICD-10-CM

## 2020-04-23 DIAGNOSIS — Z79899 Other long term (current) drug therapy: Secondary | ICD-10-CM | POA: Diagnosis not present

## 2020-04-23 DIAGNOSIS — E1165 Type 2 diabetes mellitus with hyperglycemia: Secondary | ICD-10-CM | POA: Diagnosis not present

## 2020-04-23 DIAGNOSIS — I7121 Aneurysm of the ascending aorta, without rupture: Secondary | ICD-10-CM

## 2020-04-23 DIAGNOSIS — E7800 Pure hypercholesterolemia, unspecified: Secondary | ICD-10-CM | POA: Diagnosis not present

## 2020-04-23 DIAGNOSIS — Z940 Kidney transplant status: Secondary | ICD-10-CM | POA: Diagnosis not present

## 2020-04-23 DIAGNOSIS — R634 Abnormal weight loss: Secondary | ICD-10-CM | POA: Diagnosis not present

## 2020-04-23 DIAGNOSIS — I272 Pulmonary hypertension, unspecified: Secondary | ICD-10-CM | POA: Diagnosis not present

## 2020-04-23 NOTE — Patient Instructions (Signed)
Medication Instructions:  No medication changes. *If you need a refill on your cardiac medications before your next appointment, please call your pharmacy*   Lab Work: None ordered If you have labs (blood work) drawn today and your tests are completely normal, you will receive your results only by: . MyChart Message (if you have MyChart) OR . A paper copy in the mail If you have any lab test that is abnormal or we need to change your treatment, we will call you to review the results.   Testing/Procedures: None ordered   Follow-Up: At [facility], you and your health needs are our priority.  As part of our continuing mission to provide you with exceptional heart care, we have created designated Provider Care Teams.  These Care Teams include your primary Cardiologist (physician) and Advanced Practice Providers (APPs -  Physician Assistants and Nurse Practitioners) who all work together to provide you with the care you need, when you need it.  We recommend signing up for the patient portal called "MyChart".  Sign up information is provided on this After Visit Summary.  MyChart is used to connect with patients for Virtual Visits (Telemedicine).  Patients are able to view lab/test results, encounter notes, upcoming appointments, etc.  Non-urgent messages can be sent to your provider as well.   To learn more about what you can do with MyChart, go to [email or web address].    Your next appointment:   6 month(s)  The format for your next appointment:   In Person  Provider:   Robert Krasowski, MD   Other Instructions NA  

## 2020-04-23 NOTE — Progress Notes (Signed)
Cardiology Office Note:    Date:  04/23/2020   ID:  Jon [name], DOB 09-04-1950, MRN [id number]  PCP:  [name], MD  Cardiologist:  [name], MD    Referring MD: [name], MD   Chief Complaint  Patient presents with  . Follow-up    History of Present Illness:    Jon [name] is a 70 y.o. male with past medical history significant for renal transplant, ascending aortic aneurysm measuring 40 mm, essential hypertension, diabetes, pulmonary hypertension, however latest echocardiogram did not show an elevation of pulmonary artery pressure.  He was referred to [place] for evaluation before elective back surgery.  He did have surgery done few weeks ago doing very well he is feeling about 75% better from pain point review in terms of his back surgery.  Course was uncomplicated.  As a part of evaluation before surgery he had a stress test which showed no evidence of ischemia, he also had echocardiogram done which showed preserved ejection fraction.  Past Medical History:  Diagnosis Date  . Abnormal gait   . Acute blood loss anemia   . Acute renal failure superimposed on stage 4 chronic kidney disease ([place]) 06/04/2017  . Acute respiratory failure with hypoxia ([place]) 01/04/2019   Covid pneumonia Dec, 2020  . Acute right ankle pain 12/08/2016  . AKI (acute kidney injury) ([place]) 12/27/2018  . Anemia of chronic disease   . Arteriovenous fistula for hemodialysis in place, primary [facility]) 08/08/2017   Overview:  Left upper extremity  Formatting of this note might be different from the original. Left upper extremity  . Arthritis    back, hands   . Ascending aortic aneurysm ([place]) 10/23/2017   3.9 cm by echocardiogram  . Backache 01/27/2011  . BK viremia 02/07/2014  . Body mass index (BMI) 36.0-36.9, adult 01/16/2020  . Cerebral infarction ([place]) 08/08/2017  . Cerebrovascular accident (CVA) ([place])   . Cervical spondylosis 12/27/2012  . Chronic back pain    radiculopathy and  stenosis  . Chronic ischemic heart disease    Severe LVH  . Chronic kidney disease 2015   transplant  . Congestive heart failure (CHF) ([place])   . COVID-19 virus infection   . CVA (cerebral infarction) 03/2013  . Diabetes mellitus     Lantus nightly ;type 2  . Diabetes mellitus ([place]) 01/23/2011   Formatting of this note might be different from the original. Currently on Insulin  . Digital mucinous cyst of finger of left hand 10/10/2016  . Duodenal ulcer hemorrhagic   . Dysphagia 08/22/2017  . Dyspnea   . Dyspnea on exertion 08/24/2016   PFT 12/28/16-mild restriction, mild reduction of diffusion.  No response to bronchodilator.  FVC 3.28/70%, FEV1 2.64/75%, ratio 0.81, TLC 77%, DLCO 66%.  . Elevated blood-pressure reading, without diagnosis of hypertension 02/25/2019  . Elevated LFTs   . Elevated serum creatinine 10/24/2013  . Essential (primary) hypertension 11/27/2018   takes Amlodipine and Catapres daily    dr Forrestine Him, Loop Recorder - [facility]  . Finger infection, fungal 11/22/2016  . Fungal intestinal infection following organ transplantation 11/29/2016  . GERD (gastroesophageal reflux disease)   . GI bleed    5 units oblood   . H/O hiatal hernia   . Heart murmur   . History of blood transfusion   . History of colon polyps   . History of kidney stones    passed  . History of kidney transplant 01/24/2011   S/P  renal transplant in 2015 at [facility]- followed by Dr Moshe Cipro at [facility]  . History of stroke 03/16/2013  . HLD (hyperlipidemia) 05/02/2013  . HTN (hypertension) 01/27/2011  . Hx of cardiovascular stress test    a.  Lexiscan Myoview (05/2013):  No ischemia, EF 53%; Normal Study  . Hyperkalemia 01/23/2011  . Hyperlipidemia    takes Lovastatin daily  . Hypertension    takes Amlodipine and Catapres daily    dr Forrestine Him, Loop Recorder - [facility]  . Hypertension associated with transplantation 12/03/2014  . Hypomagnesemia 10/25/2013  . Immunosuppression ([place])  10/14/2013  . Immunosuppressive management encounter following kidney transplant 10/21/2013  . Insulin dependent type 2 diabetes mellitus ([place]) 01/28/2014   Overview:  Currently on Insulin  . Lumbago with sciatica, right side 12/17/2019  . Lumbar post-laminectomy syndrome 05/13/2015  . Mass of soft tissue of left upper extremity 10/18/2016  . Melena   . Movement disorder 03/16/2013  . Nocturia   . Obesity   . OSA (obstructive sleep apnea) 05/02/2013   Unattended Home Sleep Test 10/24/12-AHI 24/hour, desaturation to 81%.  . Osteoarthritis (arthritis due to wear and tear of joints) 08/08/2017  . Other chronic pain 12/17/2019  . Pain at surgical site 10/01/2013  . Peripheral edema    takes Lasix daily  . Personal history of COVID-19   . Pneumonia 2021  . Pneumonia due to COVID-19 virus   . Preop cardiovascular exam 03/22/2013  . Pulmonary hypertension, unspecified ([place]) 01/23/2017  . Scapholunate dissociation of left wrist 10/18/2016  . Sleep apnea    cpap , study in their home, 09/2102- [employer]           . Spinal stenosis 01/16/2020   Formatting of this note might be different from the original. Operated twice with sciatica with disectomy of lumbars  . Stroke [facility]) 2015   speech, rt arm weakness- 04/07/20 - no residual   . Swelling of lower extremity 06/30/2016  . Upper GI bleed 01/20/2019  . URI, acute 03/07/2014  . Urinary frequency     Past Surgical History:  Procedure Laterality Date  . AV FISTULA PLACEMENT Left 04/03/2013   Procedure: ARTERIOVENOUS (AV) FISTULA CREATION- LEFT RADIOCEPHALIC VS BRACHIOCEPHALIC;  Surgeon: Conrad [place], MD;  Location: [facility];  Service: Vascular;  Laterality: Left;  . AV FISTULA PLACEMENT Left 05/14/2013   Procedure: LEFT BRACHIOCEPHALIC ARTERIOVENOUS (AV) FISTULA CREATION;  Surgeon: Conrad [place], MD;  Location: [facility];  Service: Vascular;  Laterality: Left;  . BACK SURGERY  12/2012   2003- 1st back surgery & then 2014- fusion  . COLONOSCOPY    .  ESOPHAGOGASTRODUODENOSCOPY    . ESOPHAGOGASTRODUODENOSCOPY (EGD) WITH PROPOFOL N/A 01/22/2019   Procedure: ESOPHAGOGASTRODUODENOSCOPY (EGD) WITH PROPOFOL;  Surgeon: [name], MD;  Location: [facility];  Service: Gastroenterology;  Laterality: N/A;  . HEMORRHOID SURGERY    . HEMOSTASIS CLIP PLACEMENT  01/22/2019   Procedure: HEMOSTASIS CLIP PLACEMENT;  Surgeon: [name], MD;  Location: [facility] ENDOSCOPY;  Service: Gastroenterology;;  . KIDNEY TRANSPLANT  10/01/2013  . left knee surgery    . LOOP RECORDER IMPLANT  03/19/13   MDT LinQ implanted by Dr Rayann Heman for cryptogenic stroke  . LOOP RECORDER IMPLANT N/A 03/19/2013   Procedure: LOOP RECORDER IMPLANT;  Surgeon: Coralyn Mark, MD;  Location: [facility] CATH LAB;  Service: Cardiovascular;  Laterality: N/A;  . LUMBAR FUSION  04/08/2020   Dr. Newman Pies  . NEPHRECTOMY TRANSPLANTED ORGAN    . NEUROPLASTY /  TRANSPOSITION MEDIAN NERVE AT CARPAL TUNNEL BILATERAL    . right leg surgery     pin in place  . right wrist surgery    . SCLEROTHERAPY  01/22/2019   Procedure: Clide Deutscher;  Surgeon: [name], MD;  Location: [facility] ENDOSCOPY;  Service: Gastroenterology;;  . TEE WITHOUT CARDIOVERSION N/A 03/19/2013   Procedure: TRANSESOPHAGEAL ECHOCARDIOGRAM (TEE);  Surgeon: [name], MD;  Location: [facility] ENDOSCOPY;  Service: Cardiovascular;  Laterality: N/A;  . THYROID SURGERY  10/13/2015   Performed at [facility] with surgical pathology revealed consistent with benign follicular nodule ([name] category II)    Current Medications: Current Meds  Medication Sig  . amLODipine (NORVASC) 10 MG tablet Take 10 mg by mouth daily.  [name] aspirin 81 MG EC tablet Take 81 mg by mouth daily. Swallow whole.  [name] atorvastatin (LIPITOR) 40 MG tablet Take 40 mg by mouth at bedtime.  . calcitRIOL (ROCALTROL) 0.25 MCG capsule Take 0.25 mcg by mouth every Monday, Tuesday, Wednesday, Thursday, and Friday.  . cholecalciferol (VITAMIN D3) 25 MCG (1000 UNIT) tablet Take 1,000  Units by mouth daily.  . furosemide (LASIX) 40 MG tablet Take 1 tablet (40 mg total) by mouth daily.  [name] gabapentin (NEURONTIN) 300 MG capsule Take 300 mg by mouth 3 (three) times daily.  [name] HYDROcodone-acetaminophen (NORCO) 10-325 MG tablet Take 1-2 tablets by mouth every 4 (four) hours as needed for moderate pain.  . Insulin Glargine (BASAGLAR KWIKPEN) 100 UNIT/ML Inject 40 Units into the skin at bedtime.  . insulin lispro (HUMALOG KWIKPEN) 100 UNIT/ML KwikPen Inject 5 Units into the skin See admin instructions. Sliding scale over 150 add 2 units Three times a day  . methimazole (TAPAZOLE) 5 MG tablet Take 2.5 mg by mouth daily.   . Multiple Vitamin (MULTIVITAMIN WITH MINERALS) TABS tablet Take 1 tablet by mouth daily. Unknown strength  . mycophenolate (MYFORTIC) 180 MG EC tablet Take 180 mg by mouth 2 (two) times daily.   . pantoprazole (PROTONIX) 40 MG tablet Take 1 tablet (40 mg total) by mouth 2 (two) times daily before a meal.  . predniSONE (DELTASONE) 5 MG tablet Take 1 tablet (5 mg total) by mouth daily with breakfast.  . St Johns Wort 300 MG CAPS Take 300 mg by mouth 3 (three) times daily.  [name] sulfamethoxazole-trimethoprim (BACTRIM) 400-80 MG tablet Take 1 tablet by mouth every Monday, Wednesday, and Friday.  . tacrolimus (PROGRAF) 1 MG capsule Take 2 mg by mouth 2 (two) times daily.  . tamsulosin (FLOMAX) 0.4 MG CAPS capsule Take 0.4 mg by mouth at bedtime.  . TRADJENTA 5 MG TABS tablet Take 5 mg by mouth daily.  . vitamin B-12 (CYANOCOBALAMIN) 1000 MCG tablet Take 1,000 mcg by mouth 2 (two) times daily.   . vitamin C (ASCORBIC ACID) 500 MG tablet Take 500 mg by mouth daily.  . Zinc 30 MG CAPS Take 30 mg by mouth 2 (two) times daily.     Allergies:   Lisinopril, Meloxicam, and Victoza [liraglutide]   Social History   Socioeconomic History  . Marital status: Married    Spouse name: agnes  . Number of children: 5  . Years of education: 8th  . Highest education level: Not on file   Occupational History  . Occupation: disabled  Tobacco Use  . Smoking status: Former Smoker    Packs/day: 0.50    Years: 40.00    Pack years: 20.00    Types: Cigarettes    Quit date: 12/27/2012  Years since quitting: 7.3  . Smokeless tobacco: Never Used  Vaping Use  . Vaping Use: Never used  Substance and Sexual Activity  . Alcohol use: No    Alcohol/week: 0.0 standard drinks  . Drug use: No  . Sexual activity: Yes  Other Topics Concern  . Not on file  Social History Narrative   Patient lives at home with his wife Herbert Pun).  One story home with basement.   Patient is disabled.   Education 8th grade.   Right handed.   Caffeine One cup of coffee daily.   Retired Administrator.   5 children.   Social Determinants of Health   Financial Resource Strain: Not on file  Food Insecurity: Not on file  Transportation Needs: Not on file  Physical Activity: Not on file  Stress: Not on file  Social Connections: Not on file     Family History: The patient's family history includes Diabetes in his brother; Heart disease in his father; Hyperlipidemia in his father and son; Hypertension in his father and mother; Renal Disease in his father. ROS:   Please see the history of present illness.    All 14 point review of systems negative except as described per history of present illness  EKGs/Labs/Other Studies Reviewed:      Recent Labs: 04/09/2020: BUN 29; Creatinine, Ser 2.06; Hemoglobin 10.7; Platelets 112; Potassium 4.9; Sodium 139  Recent Lipid Panel    Component Value Date/Time   CHOL 137 01/02/2017 0343   TRIG 113 01/04/2019 0011   HDL 38 (L) 01/02/2017 0343   CHOLHDL 3.6 01/02/2017 0343   VLDL 16 01/02/2017 0343   LDLCALC 83 01/02/2017 0343    Physical Exam:    VS:  BP 140/62 (BP Location: Left Arm, Patient Position: Sitting)   Pulse 91   Ht 5\' 11"  (1.803 m)   Wt 237 lb (107.5 kg)   SpO2 96%   BMI 33.05 kg/m     Wt Readings from Last 3 Encounters:  04/23/20  237 lb (107.5 kg)  04/08/20 243 lb (110.2 kg)  03/03/20 258 lb (117 kg)     GEN:  Well nourished, well developed in no acute distress HEENT: Normal NECK: No JVD; No carotid bruits LYMPHATICS: No lymphadenopathy CARDIAC: RRR, systolic ejection murmur grade 1/6 to 2/6 best heard right upper portion of the sternum, no rubs, no gallops RESPIRATORY:  Clear to auscultation without rales, wheezing or rhonchi  ABDOMEN: Soft, non-tender, non-distended MUSCULOSKELETAL:  No edema; No deformity  SKIN: Warm and dry LOWER EXTREMITIES: no swelling NEUROLOGIC:  Alert and oriented x 3 PSYCHIATRIC:  Normal affect   ASSESSMENT:    1. Pulmonary hypertension, unspecified ([place])   2. Essential (primary) hypertension   3. Ascending aortic aneurysm ([place])   4. OSA (obstructive sleep apnea)    PLAN:    In order of problems listed above:  1. Pulmonary hypertension, echocardiogram did not show any enlargement of the right ventricle right atrium, there was no tricuspid regurgitation therefore we could not assess pulmonary pressure based on echocardiogram but also suggests that there is no significant pulmonary hypertension. 2. Essential hypertension overall seems to be controlled continue present management. 3. Ascending aortic aneurysm no increase in size 40 mm still medical management. 4. Obstructive sleep apnea to be followed by entire medicine team. 5. Dyslipidemia, I did review his K PN which show me his LDL of 70 HDL 39 good cholesterol profile we will continue present management.   Medication Adjustments/Labs and Tests  Ordered: Current medicines are reviewed at length with the patient today.  Concerns regarding medicines are outlined above.  No orders of the defined types were placed in this encounter.  Medication changes: No orders of the defined types were placed in this encounter.   Signed, Park Liter, MD, [facility] 04/23/2020 1:28 PM    [facility]

## 2020-04-28 ENCOUNTER — Ambulatory Visit: Payer: Medicare Other | Admitting: Oncology

## 2020-04-28 ENCOUNTER — Other Ambulatory Visit: Payer: Medicare Other

## 2020-04-28 DIAGNOSIS — M48062 Spinal stenosis, lumbar region with neurogenic claudication: Secondary | ICD-10-CM | POA: Diagnosis not present

## 2020-05-01 DIAGNOSIS — R634 Abnormal weight loss: Secondary | ICD-10-CM | POA: Diagnosis not present

## 2020-05-01 DIAGNOSIS — Z940 Kidney transplant status: Secondary | ICD-10-CM | POA: Diagnosis not present

## 2020-05-01 DIAGNOSIS — K922 Gastrointestinal hemorrhage, unspecified: Secondary | ICD-10-CM | POA: Diagnosis not present

## 2020-05-01 DIAGNOSIS — R809 Proteinuria, unspecified: Secondary | ICD-10-CM | POA: Diagnosis not present

## 2020-05-01 DIAGNOSIS — E1129 Type 2 diabetes mellitus with other diabetic kidney complication: Secondary | ICD-10-CM | POA: Diagnosis not present

## 2020-05-01 DIAGNOSIS — I10 Essential (primary) hypertension: Secondary | ICD-10-CM | POA: Diagnosis not present

## 2020-05-01 DIAGNOSIS — B348 Other viral infections of unspecified site: Secondary | ICD-10-CM | POA: Diagnosis not present

## 2020-05-01 DIAGNOSIS — N2581 Secondary hyperparathyroidism of renal origin: Secondary | ICD-10-CM | POA: Diagnosis not present

## 2020-05-03 ENCOUNTER — Other Ambulatory Visit: Payer: Self-pay | Admitting: Oncology

## 2020-05-03 DIAGNOSIS — I272 Pulmonary hypertension, unspecified: Secondary | ICD-10-CM

## 2020-05-03 DIAGNOSIS — D638 Anemia in other chronic diseases classified elsewhere: Secondary | ICD-10-CM

## 2020-05-03 NOTE — Progress Notes (Signed)
[facility]  [address] [place],  [place]  [address] [phone]  Clinic Day:  05/04/2020  Referring physician: [name], MD   HISTORY OF PRESENT ILLNESS:  The patient is a 70 y.o. male with anemia secondary to iron deficiency and chronic renal insufficiency.  He comes in today to reassess his hemoglobin.  Since his last visit, the patient has been doing much better.  He denies having increased fatigue or any overt forms of blood loss which concern him for progressive anemia.   PHYSICAL EXAM:  Blood pressure (!) 172/75, pulse 86, temperature 98.5 F (36.9 C), resp. rate 16, height 5\' 11"  (1.803 m), weight 243 lb 9.6 oz (110.5 kg), SpO2 99 %. Wt Readings from Last 3 Encounters:  05/04/20 243 lb 9.6 oz (110.5 kg)  04/23/20 237 lb (107.5 kg)  04/08/20 243 lb (110.2 kg)   Body mass index is 33.98 kg/m. Performance status (ECOG): 1 - Symptomatic but completely ambulatory Physical Exam Constitutional:      Appearance: Normal appearance. He is not ill-appearing.  HENT:     Mouth/Throat:     Mouth: Mucous membranes are moist.     Pharynx: Oropharynx is clear. No oropharyngeal exudate or posterior oropharyngeal erythema.  Cardiovascular:     Rate and Rhythm: Normal rate and regular rhythm.     Heart sounds: No murmur heard. No friction rub. No gallop.   Pulmonary:     Effort: Pulmonary effort is normal. No respiratory distress.     Breath sounds: Normal breath sounds. No wheezing, rhonchi or rales.  Chest:  Breasts:     Right: No axillary adenopathy or supraclavicular adenopathy.     Left: No axillary adenopathy or supraclavicular adenopathy.    Abdominal:     General: Bowel sounds are normal. There is no distension.     Palpations: Abdomen is soft. There is no mass.     Tenderness: There is no abdominal tenderness.  Musculoskeletal:        General: No swelling.     Right lower leg: No edema.     Left lower leg: No edema.   Lymphadenopathy:     Cervical: No cervical adenopathy.     Upper Body:     Right upper body: No supraclavicular or axillary adenopathy.     Left upper body: No supraclavicular or axillary adenopathy.     Lower Body: No right inguinal adenopathy. No left inguinal adenopathy.  Skin:    General: Skin is warm.     Coloration: Skin is not jaundiced.     Findings: No lesion or rash.  Neurological:     General: No focal deficit present.     Mental Status: He is alert and oriented to person, place, and time. Mental status is at baseline.     Cranial Nerves: Cranial nerves are intact.  Psychiatric:        Mood and Affect: Mood normal.        Behavior: Behavior normal.        Thought Content: Thought content normal.     LABS:   CBC Latest Ref Rng & Units 05/04/2020 04/09/2020 04/08/2020  WBC - 7.6 10.4 8.0  Hemoglobin 13.5 - 17.5 11.1(A) 10.7(L) 11.5(L)  Hematocrit 41 - 53 35(A) 34.0(L) 37.4(L)  Platelets 150 - 399 147(A) 112(L) 120(L)   CMP Latest Ref Rng & Units 05/04/2020 04/09/2020 04/08/2020  Glucose 70 - 99 mg/dL - 208(H) 137(H)  BUN 4 - 21  30(A) 29(H) 28(H)  Creatinine 0.6 - 1.3 2.0(A) 2.06(H) 1.98(H)  Sodium 137 - 147 140 139 140  Potassium 3.4 - 5.3 4.0 4.9 4.0  Chloride 99 - 108 106 108 106  CO2 13 - 22 26(A) 23 24  Calcium 8.7 - 10.7 8.7 8.4(L) 8.9  Total Protein 6.5 - 8.1 g/dL - - -  Total Bilirubin 0.3 - 1.2 mg/dL - - -  Alkaline Phos 25 - 125 155(A) - -  AST 14 - 40 25 - -  ALT 10 - 40 23 - -    ASSESSMENT & PLAN:   Assessment/Plan:  A 70 y.o. male with anemia secondary to iron deficiency and chronic renal insufficiency.  His labs today show that his hemoglobin remains well above 10.  As that is the case, red cell shot therapy is not necessary at this time.  He knows it remains imperative for him to keep his hypertension and diabetes under tight control in order to prevent his kidney function from declining over time.  I will see him back in 4 months for repeat clinical  assessment.  The patient understands all the plans discussed today and is in agreement with them.      Kaycie Pegues Macarthur Critchley, MD

## 2020-05-04 ENCOUNTER — Other Ambulatory Visit: Payer: Self-pay | Admitting: Oncology

## 2020-05-04 ENCOUNTER — Inpatient Hospital Stay: Payer: Medicare Other | Attending: Oncology

## 2020-05-04 ENCOUNTER — Other Ambulatory Visit: Payer: Self-pay | Admitting: Hematology and Oncology

## 2020-05-04 ENCOUNTER — Telehealth: Payer: Self-pay

## 2020-05-04 ENCOUNTER — Telehealth: Payer: Self-pay | Admitting: Oncology

## 2020-05-04 ENCOUNTER — Inpatient Hospital Stay (INDEPENDENT_AMBULATORY_CARE_PROVIDER_SITE_OTHER): Payer: Medicare Other | Admitting: Oncology

## 2020-05-04 ENCOUNTER — Other Ambulatory Visit: Payer: Self-pay

## 2020-05-04 VITALS — BP 172/75 | HR 86 | Temp 98.5°F | Resp 16 | Ht 71.0 in | Wt 243.6 lb

## 2020-05-04 DIAGNOSIS — N189 Chronic kidney disease, unspecified: Secondary | ICD-10-CM

## 2020-05-04 DIAGNOSIS — D638 Anemia in other chronic diseases classified elsewhere: Secondary | ICD-10-CM

## 2020-05-04 DIAGNOSIS — D631 Anemia in chronic kidney disease: Secondary | ICD-10-CM | POA: Insufficient documentation

## 2020-05-04 DIAGNOSIS — D509 Iron deficiency anemia, unspecified: Secondary | ICD-10-CM | POA: Insufficient documentation

## 2020-05-04 DIAGNOSIS — D649 Anemia, unspecified: Secondary | ICD-10-CM | POA: Diagnosis not present

## 2020-05-04 LAB — BASIC METABOLIC PANEL
BUN: 30 — AB (ref 4–21)
CO2: 26 — AB (ref 13–22)
Chloride: 106 (ref 99–108)
Creatinine: 2 — AB (ref 0.6–1.3)
Glucose: 150
Potassium: 4 (ref 3.4–5.3)
Sodium: 140 (ref 137–147)

## 2020-05-04 LAB — HEPATIC FUNCTION PANEL
ALT: 23 (ref 10–40)
AST: 25 (ref 14–40)
Alkaline Phosphatase: 155 — AB (ref 25–125)
Bilirubin, Total: 0.4

## 2020-05-04 LAB — IRON AND TIBC
Iron: 33 ug/dL — ABNORMAL LOW (ref 45–182)
Iron: 37 ug/dL — ABNORMAL LOW (ref 45–182)
Saturation Ratios: 11 % — ABNORMAL LOW (ref 17.9–39.5)
Saturation Ratios: 12 % — ABNORMAL LOW (ref 17.9–39.5)
TIBC: 295 ug/dL (ref 250–450)
TIBC: 299 ug/dL (ref 250–450)
UIBC: 262 ug/dL
UIBC: 262 ug/dL

## 2020-05-04 LAB — CBC AND DIFFERENTIAL
HCT: 35 — AB (ref 41–53)
Hemoglobin: 11.1 — AB (ref 13.5–17.5)
Neutrophils Absolute: 6.31
Platelets: 147 — AB (ref 150–399)
WBC: 7.6

## 2020-05-04 LAB — FERRITIN
Ferritin: 84 ng/mL (ref 24–336)
Ferritin: 88 ng/mL (ref 24–336)

## 2020-05-04 LAB — CBC
MCV: 85 (ref 80–94)
RBC: 4.05 (ref 3.87–5.11)

## 2020-05-04 LAB — COMPREHENSIVE METABOLIC PANEL
Albumin: 3.8 (ref 3.5–5.0)
Calcium: 8.7 (ref 8.7–10.7)

## 2020-05-04 NOTE — Telephone Encounter (Signed)
Pt's wife notified that pt does not need RBC injection, & that Dr Bobby Rumpf requests 4 month f/u. She verbalized understanding.

## 2020-05-04 NOTE — Telephone Encounter (Signed)
Per 4/25 LOS, patient scheduled for Aug Appt's.  Gave Appt Summary

## 2020-05-14 ENCOUNTER — Other Ambulatory Visit: Payer: Self-pay

## 2020-05-14 ENCOUNTER — Ambulatory Visit (HOSPITAL_COMMUNITY)
Admission: RE | Admit: 2020-05-14 | Discharge: 2020-05-14 | Disposition: A | Discharge status: Home / Self Care | Payer: Medicare Other | Source: Ambulatory Visit | Attending: Urology | Admitting: Urology

## 2020-05-14 DIAGNOSIS — Z0389 Encounter for observation for other suspected diseases and conditions ruled out: Secondary | ICD-10-CM | POA: Diagnosis not present

## 2020-05-14 DIAGNOSIS — N200 Calculus of kidney: Secondary | ICD-10-CM | POA: Diagnosis not present

## 2020-05-18 DIAGNOSIS — G4733 Obstructive sleep apnea (adult) (pediatric): Secondary | ICD-10-CM | POA: Diagnosis not present

## 2020-05-22 DIAGNOSIS — N200 Calculus of kidney: Secondary | ICD-10-CM | POA: Diagnosis not present

## 2020-06-01 DIAGNOSIS — M48062 Spinal stenosis, lumbar region with neurogenic claudication: Secondary | ICD-10-CM | POA: Diagnosis not present

## 2020-06-01 DIAGNOSIS — M4316 Spondylolisthesis, lumbar region: Secondary | ICD-10-CM | POA: Diagnosis not present

## 2020-06-04 DIAGNOSIS — E0590 Thyrotoxicosis, unspecified without thyrotoxic crisis or storm: Secondary | ICD-10-CM | POA: Diagnosis not present

## 2020-06-26 DIAGNOSIS — N189 Chronic kidney disease, unspecified: Secondary | ICD-10-CM | POA: Diagnosis not present

## 2020-06-26 DIAGNOSIS — I10 Essential (primary) hypertension: Secondary | ICD-10-CM | POA: Diagnosis not present

## 2020-06-26 DIAGNOSIS — I509 Heart failure, unspecified: Secondary | ICD-10-CM | POA: Diagnosis not present

## 2020-06-30 DIAGNOSIS — M4316 Spondylolisthesis, lumbar region: Secondary | ICD-10-CM | POA: Diagnosis not present

## 2020-07-09 DIAGNOSIS — I130 Hypertensive heart and chronic kidney disease with heart failure and stage 1 through stage 4 chronic kidney disease, or unspecified chronic kidney disease: Secondary | ICD-10-CM | POA: Diagnosis not present

## 2020-07-09 DIAGNOSIS — N189 Chronic kidney disease, unspecified: Secondary | ICD-10-CM | POA: Diagnosis not present

## 2020-07-09 DIAGNOSIS — E1122 Type 2 diabetes mellitus with diabetic chronic kidney disease: Secondary | ICD-10-CM | POA: Diagnosis not present

## 2020-07-09 DIAGNOSIS — I509 Heart failure, unspecified: Secondary | ICD-10-CM | POA: Diagnosis not present

## 2020-07-20 DIAGNOSIS — E119 Type 2 diabetes mellitus without complications: Secondary | ICD-10-CM | POA: Diagnosis not present

## 2020-07-21 DIAGNOSIS — Z940 Kidney transplant status: Secondary | ICD-10-CM | POA: Diagnosis not present

## 2020-08-03 DIAGNOSIS — E119 Type 2 diabetes mellitus without complications: Secondary | ICD-10-CM | POA: Diagnosis not present

## 2020-08-09 DIAGNOSIS — N189 Chronic kidney disease, unspecified: Secondary | ICD-10-CM | POA: Diagnosis not present

## 2020-08-09 DIAGNOSIS — E1122 Type 2 diabetes mellitus with diabetic chronic kidney disease: Secondary | ICD-10-CM | POA: Diagnosis not present

## 2020-08-09 DIAGNOSIS — I130 Hypertensive heart and chronic kidney disease with heart failure and stage 1 through stage 4 chronic kidney disease, or unspecified chronic kidney disease: Secondary | ICD-10-CM | POA: Diagnosis not present

## 2020-08-09 DIAGNOSIS — I509 Heart failure, unspecified: Secondary | ICD-10-CM | POA: Diagnosis not present

## 2020-08-17 DIAGNOSIS — E1165 Type 2 diabetes mellitus with hyperglycemia: Secondary | ICD-10-CM | POA: Diagnosis not present

## 2020-08-17 DIAGNOSIS — I10 Essential (primary) hypertension: Secondary | ICD-10-CM | POA: Diagnosis not present

## 2020-08-17 DIAGNOSIS — G4733 Obstructive sleep apnea (adult) (pediatric): Secondary | ICD-10-CM | POA: Diagnosis not present

## 2020-08-17 DIAGNOSIS — E7800 Pure hypercholesterolemia, unspecified: Secondary | ICD-10-CM | POA: Diagnosis not present

## 2020-08-17 DIAGNOSIS — Z794 Long term (current) use of insulin: Secondary | ICD-10-CM | POA: Diagnosis not present

## 2020-08-26 NOTE — Progress Notes (Signed)
[facility]  [address] [place],  [place]  [address] [phone]  Clinic Day:  09/03/2020  Referring physician: [name], MD  This document serves as a record of services personally performed by Sekou Zuckerman Macarthur Critchley, MD. It was created on their behalf by [facility], a trained medical scribe. The creation of this record is based on the scribe's personal observations and the provider's statements to them.  HISTORY OF PRESENT ILLNESS:  The patient is a 70 y.o. male with anemia secondary to iron deficiency and chronic renal insufficiency.  He comes in today to reassess his hemoglobin.  Since his last visit, the patient has been doing fine.  He denies having increased fatigue or any overt forms of blood loss which concern him for progressive anemia.  PHYSICAL EXAM:  Blood pressure (!) 183/83, pulse 66, temperature 97.9 F (36.6 C), resp. rate 16, height 5\' 11"  (1.803 m), weight 248 lb 12.8 oz (112.9 kg), SpO2 98 %. Wt Readings from Last 3 Encounters:  09/03/20 248 lb 12.8 oz (112.9 kg)  05/04/20 243 lb 9.6 oz (110.5 kg)  04/23/20 237 lb (107.5 kg)   Body mass index is 34.7 kg/m. Performance status (ECOG): 1 - Symptomatic but completely ambulatory Physical Exam Constitutional:      General: He is not in acute distress.    Appearance: Normal appearance. He is normal weight.  HENT:     Head: Normocephalic and atraumatic.  Eyes:     General: No scleral icterus.    Extraocular Movements: Extraocular movements intact.     Conjunctiva/sclera: Conjunctivae normal.     Pupils: Pupils are equal, round, and reactive to light.  Cardiovascular:     Rate and Rhythm: Normal rate and regular rhythm.     Pulses: Normal pulses.     Heart sounds: Normal heart sounds. No murmur heard.   No friction rub. No gallop.  Pulmonary:     Effort: Pulmonary effort is normal. No respiratory distress.     Breath sounds: Normal breath sounds.  Abdominal:      General: Bowel sounds are normal. There is no distension.     Palpations: Abdomen is soft. There is no hepatomegaly, splenomegaly or mass.     Tenderness: There is no abdominal tenderness.  Musculoskeletal:        General: Normal range of motion.     Cervical back: Normal range of motion and neck supple.     Right lower leg: No edema.     Left lower leg: No edema.  Lymphadenopathy:     Cervical: No cervical adenopathy.  Skin:    General: Skin is warm and dry.  Neurological:     General: No focal deficit present.     Mental Status: He is alert and oriented to person, place, and time. Mental status is at baseline.  Psychiatric:        Mood and Affect: Mood normal.        Behavior: Behavior normal.        Thought Content: Thought content normal.        Judgment: Judgment normal.    LABS:   CBC Latest Ref Rng & Units 09/03/2020 05/04/2020 04/09/2020  WBC - 7.0 7.6 10.4  Hemoglobin 13.5 - 17.5 10.5(A) 11.1(A) 10.7(L)  Hematocrit 41 - 53 32(A) 35(A) 34.0(L)  Platelets 150 - 399 136(A) 147(A) 112(L)   CMP Latest Ref Rng & Units 09/03/2020 05/04/2020 04/09/2020  Glucose 70 - 99 mg/dL - -  208(H)  BUN 4 - 21 34(A) 30(A) 29(H)  Creatinine 0.6 - 1.3 2.3(A) 2.0(A) 2.06(H)  Sodium 137 - 147 140 140 139  Potassium 3.4 - 5.3 4.5 4.0 4.9  Chloride 99 - 108 105 106 108  CO2 13 - 22 24(A) 26(A) 23  Calcium 8.7 - 10.7 9.2 8.7 8.4(L)  Total Protein 6.5 - 8.1 g/dL - - -  Total Bilirubin 0.3 - 1.2 mg/dL - - -  Alkaline Phos 25 - 125 112 155(A) -  AST 14 - 40 28 25 -  ALT 10 - 40 26 23 -    Ref. Range 09/03/2020 10:08  Iron Latest Ref Range: 45 - 182 ug/dL 42 (L)  UIBC Latest Units: ug/dL 342  TIBC Latest Ref Range: 250 - 450 ug/dL 384  Saturation Ratios Latest Ref Range: 17.9 - 39.5 % 11 (L)  Ferritin Latest Ref Range: 24 - 336 ng/mL 22 (L)    ASSESSMENT & PLAN:  Assessment/Plan:  A 70 y.o. male with anemia secondary to iron deficiency and chronic renal insufficiency.  His labs today show  that his hemoglobin and MCV have dropped since his last visit.  His iron parameters today are consistent with him having recurrent iron deficiency anemia.  Based upon this, I will arrange for him to receive IV iron over these next few weeks to replenish his iron stores.  He knows it remains imperative for him to keep his hypertension and diabetes under tight control in order to prevent his kidney function from declining over time, which would also exacerbate him anemia.  I will see him back in 3 months to reassess his anemia after receiving his upcoming course of IV iron.  The patient understands all the plans discussed today and is in agreement with them.     I, Rita Ohara, am acting as scribe for Marice Potter, MD    I have reviewed this report as typed by the medical scribe, and it is complete and accurate.  Drey Shaff Macarthur Critchley, MD

## 2020-08-31 DIAGNOSIS — M5136 Other intervertebral disc degeneration, lumbar region: Secondary | ICD-10-CM | POA: Diagnosis not present

## 2020-09-03 ENCOUNTER — Encounter: Payer: Self-pay | Admitting: Oncology

## 2020-09-03 ENCOUNTER — Other Ambulatory Visit: Payer: Self-pay

## 2020-09-03 ENCOUNTER — Inpatient Hospital Stay: Payer: Medicare Other | Attending: Oncology

## 2020-09-03 ENCOUNTER — Inpatient Hospital Stay (INDEPENDENT_AMBULATORY_CARE_PROVIDER_SITE_OTHER): Payer: Medicare Other | Admitting: Oncology

## 2020-09-03 ENCOUNTER — Other Ambulatory Visit: Payer: Self-pay | Admitting: Oncology

## 2020-09-03 VITALS — BP 183/83 | HR 66 | Temp 97.9°F | Resp 16 | Ht 71.0 in | Wt 248.8 lb

## 2020-09-03 DIAGNOSIS — N184 Chronic kidney disease, stage 4 (severe): Secondary | ICD-10-CM | POA: Diagnosis not present

## 2020-09-03 DIAGNOSIS — D631 Anemia in chronic kidney disease: Secondary | ICD-10-CM

## 2020-09-03 DIAGNOSIS — E611 Iron deficiency: Secondary | ICD-10-CM | POA: Diagnosis not present

## 2020-09-03 DIAGNOSIS — N189 Chronic kidney disease, unspecified: Secondary | ICD-10-CM | POA: Diagnosis not present

## 2020-09-03 DIAGNOSIS — D638 Anemia in other chronic diseases classified elsewhere: Secondary | ICD-10-CM

## 2020-09-03 DIAGNOSIS — D508 Other iron deficiency anemias: Secondary | ICD-10-CM

## 2020-09-03 DIAGNOSIS — D509 Iron deficiency anemia, unspecified: Secondary | ICD-10-CM | POA: Insufficient documentation

## 2020-09-03 DIAGNOSIS — D649 Anemia, unspecified: Secondary | ICD-10-CM | POA: Diagnosis not present

## 2020-09-03 LAB — HEPATIC FUNCTION PANEL
ALT: 26 (ref 10–40)
AST: 28 (ref 14–40)
Alkaline Phosphatase: 112 (ref 25–125)
Bilirubin, Total: 0.3

## 2020-09-03 LAB — BASIC METABOLIC PANEL
BUN: 34 — AB (ref 4–21)
CO2: 24 — AB (ref 13–22)
Chloride: 105 (ref 99–108)
Creatinine: 2.3 — AB (ref 0.6–1.3)
Glucose: 154
Potassium: 4.5 (ref 3.4–5.3)
Sodium: 140 (ref 137–147)

## 2020-09-03 LAB — IRON AND TIBC
Iron: 42 ug/dL — ABNORMAL LOW (ref 45–182)
Saturation Ratios: 11 % — ABNORMAL LOW (ref 17.9–39.5)
TIBC: 384 ug/dL (ref 250–450)
UIBC: 342 ug/dL

## 2020-09-03 LAB — COMPREHENSIVE METABOLIC PANEL
Albumin: 4.2 (ref 3.5–5.0)
Calcium: 9.2 (ref 8.7–10.7)

## 2020-09-03 LAB — FERRITIN: Ferritin: 22 ng/mL — ABNORMAL LOW (ref 24–336)

## 2020-09-03 LAB — CBC AND DIFFERENTIAL
HCT: 32 — AB (ref 41–53)
Hemoglobin: 10.5 — AB (ref 13.5–17.5)
Neutrophils Absolute: 5.67
Platelets: 136 — AB (ref 150–399)
WBC: 7

## 2020-09-03 LAB — CBC: RBC: 3.95 (ref 3.87–5.11)

## 2020-09-07 DIAGNOSIS — K922 Gastrointestinal hemorrhage, unspecified: Secondary | ICD-10-CM | POA: Diagnosis not present

## 2020-09-07 DIAGNOSIS — R809 Proteinuria, unspecified: Secondary | ICD-10-CM | POA: Diagnosis not present

## 2020-09-07 DIAGNOSIS — E1129 Type 2 diabetes mellitus with other diabetic kidney complication: Secondary | ICD-10-CM | POA: Diagnosis not present

## 2020-09-07 DIAGNOSIS — B348 Other viral infections of unspecified site: Secondary | ICD-10-CM | POA: Diagnosis not present

## 2020-09-07 DIAGNOSIS — I10 Essential (primary) hypertension: Secondary | ICD-10-CM | POA: Diagnosis not present

## 2020-09-07 DIAGNOSIS — R634 Abnormal weight loss: Secondary | ICD-10-CM | POA: Diagnosis not present

## 2020-09-07 DIAGNOSIS — Z940 Kidney transplant status: Secondary | ICD-10-CM | POA: Diagnosis not present

## 2020-09-07 DIAGNOSIS — D631 Anemia in chronic kidney disease: Secondary | ICD-10-CM | POA: Diagnosis not present

## 2020-09-07 DIAGNOSIS — N2581 Secondary hyperparathyroidism of renal origin: Secondary | ICD-10-CM | POA: Diagnosis not present

## 2020-09-09 DIAGNOSIS — I130 Hypertensive heart and chronic kidney disease with heart failure and stage 1 through stage 4 chronic kidney disease, or unspecified chronic kidney disease: Secondary | ICD-10-CM | POA: Diagnosis not present

## 2020-09-09 DIAGNOSIS — I509 Heart failure, unspecified: Secondary | ICD-10-CM | POA: Diagnosis not present

## 2020-09-09 DIAGNOSIS — E1122 Type 2 diabetes mellitus with diabetic chronic kidney disease: Secondary | ICD-10-CM | POA: Diagnosis not present

## 2020-09-09 DIAGNOSIS — N189 Chronic kidney disease, unspecified: Secondary | ICD-10-CM | POA: Diagnosis not present

## 2020-09-10 ENCOUNTER — Encounter: Payer: Self-pay | Admitting: Oncology

## 2020-09-14 ENCOUNTER — Encounter: Payer: Self-pay | Admitting: Oncology

## 2020-09-15 ENCOUNTER — Telehealth: Payer: Self-pay | Admitting: Oncology

## 2020-09-15 NOTE — Telephone Encounter (Signed)
Called patient to verify 9/9, 9/16 [facility]

## 2020-09-17 MED FILL — Ferumoxytol Inj 510 MG/17ML (30 MG/ML) (Elemental Fe): INTRAVENOUS | Qty: 17 | Status: AC

## 2020-09-18 ENCOUNTER — Other Ambulatory Visit: Payer: Self-pay

## 2020-09-18 ENCOUNTER — Inpatient Hospital Stay: Payer: Medicare Other | Attending: Oncology

## 2020-09-18 VITALS — BP 135/62 | HR 60 | Temp 97.7°F | Resp 18 | Ht 71.0 in | Wt 251.5 lb

## 2020-09-18 DIAGNOSIS — D509 Iron deficiency anemia, unspecified: Secondary | ICD-10-CM | POA: Diagnosis not present

## 2020-09-18 MED ORDER — SODIUM CHLORIDE 0.9 % IV SOLN: Freq: Once | INTRAVENOUS | Status: AC

## 2020-09-18 MED ORDER — SODIUM CHLORIDE 0.9 % IV SOLN
510.0000 mg | Freq: Once | INTRAVENOUS | Status: AC
  Administered 2020-09-18: 510 mg via INTRAVENOUS
  Filled 2020-09-18: qty 17

## 2020-09-18 NOTE — Patient Instructions (Signed)
Ferumoxytol Injection What is this medication? FERUMOXYTOL (FER ue MOX i tol) treats low levels of iron in your body (iron deficiency anemia). Iron is a mineral that plays an important role in making red blood cells, which carry oxygen from your lungs to the rest of your body. This medicine may be used for other purposes; ask your health care provider or pharmacist if you have questions. COMMON BRAND NAME(S): Feraheme What should I tell my care team before I take this medication? They need to know if you have any of these conditions: Anemia not caused by low iron levels High levels of iron in the blood Magnetic resonance imaging (MRI) test scheduled An unusual or allergic reaction to iron, other medications, foods, dyes, or preservatives Pregnant or trying to get pregnant Breast-feeding How should I use this medication? This medication is for injection into a vein. It is given in a hospital or clinic setting. Talk to your care team the use of this medication in children. Special care may be needed. Overdosage: If you think you have taken too much of this medicine contact a poison control center or emergency room at once. NOTE: This medicine is only for you. Do not share this medicine with others. What if I miss a dose? It is important not to miss your dose. Call your care team if you are unable to keep an appointment. What may interact with this medication? Other iron products This list may not describe all possible interactions. Give your health care provider a list of all the medicines, herbs, non-prescription drugs, or dietary supplements you use. Also tell them if you smoke, drink alcohol, or use illegal drugs. Some items may interact with your medicine. What should I watch for while using this medication? Visit your care team regularly. Tell your care team if your symptoms do not start to get better or if they get worse. You may need blood work done while you are taking this  medication. You may need to follow a special diet. Talk to your care team. Foods that contain iron include: whole grains/cereals, dried fruits, beans, or peas, leafy green vegetables, and organ meats (liver, kidney). What side effects may I notice from receiving this medication? Side effects that you should report to your care team as soon as possible: Allergic reactions-skin rash, itching, hives, swelling of the face, lips, tongue, or throat Low blood pressure-dizziness, feeling faint or lightheaded, blurry vision Shortness of breath Side effects that usually do not require medical attention (report to your care team if they continue or are bothersome): Flushing Headache Joint pain Muscle pain Nausea Pain, redness, or irritation at injection site This list may not describe all possible side effects. Call your doctor for medical advice about side effects. You may report side effects to FDA at 1-800-FDA-1088. Where should I keep my medication? This medication is given in a hospital or clinic and will not be stored at home. NOTE: This sheet is a summary. It may not cover all possible information. If you have questions about this medicine, talk to your doctor, pharmacist, or health care provider.  2022 Elsevier/Gold Standard (2020-05-15 15:35:12)  

## 2020-09-18 NOTE — Progress Notes (Signed)
1049:PT STABLE AT TIME OF DISCHARGE °

## 2020-09-22 ENCOUNTER — Other Ambulatory Visit: Payer: Self-pay | Admitting: Pharmacist

## 2020-09-24 MED FILL — Ferumoxytol Inj 510 MG/17ML (30 MG/ML) (Elemental Fe): INTRAVENOUS | Qty: 17 | Status: AC

## 2020-09-25 ENCOUNTER — Inpatient Hospital Stay: Payer: Medicare Other

## 2020-09-25 ENCOUNTER — Other Ambulatory Visit: Payer: Self-pay

## 2020-09-25 VITALS — BP 134/72 | HR 62 | Temp 98.4°F | Resp 18 | Ht 71.0 in | Wt 244.2 lb

## 2020-09-25 DIAGNOSIS — D509 Iron deficiency anemia, unspecified: Secondary | ICD-10-CM | POA: Diagnosis not present

## 2020-09-25 MED ORDER — SODIUM CHLORIDE 0.9 % IV SOLN
510.0000 mg | Freq: Once | INTRAVENOUS | Status: AC
  Administered 2020-09-25: 510 mg via INTRAVENOUS
  Filled 2020-09-25: qty 17

## 2020-09-25 MED ORDER — SODIUM CHLORIDE 0.9 % IV SOLN: Freq: Once | INTRAVENOUS | Status: AC

## 2020-09-25 NOTE — Progress Notes (Signed)
1412: PT STABLE AT TIME OF DISCHARGE

## 2020-10-07 DIAGNOSIS — D849 Immunodeficiency, unspecified: Secondary | ICD-10-CM | POA: Diagnosis not present

## 2020-10-07 DIAGNOSIS — I10 Essential (primary) hypertension: Secondary | ICD-10-CM | POA: Diagnosis not present

## 2020-10-07 DIAGNOSIS — Z794 Long term (current) use of insulin: Secondary | ICD-10-CM | POA: Diagnosis not present

## 2020-10-07 DIAGNOSIS — D84821 Immunodeficiency due to drugs: Secondary | ICD-10-CM | POA: Diagnosis not present

## 2020-10-07 DIAGNOSIS — Z7982 Long term (current) use of aspirin: Secondary | ICD-10-CM | POA: Diagnosis not present

## 2020-10-07 DIAGNOSIS — Z940 Kidney transplant status: Secondary | ICD-10-CM | POA: Diagnosis not present

## 2020-10-07 DIAGNOSIS — I272 Pulmonary hypertension, unspecified: Secondary | ICD-10-CM | POA: Diagnosis not present

## 2020-10-07 DIAGNOSIS — Z949 Transplanted organ and tissue status, unspecified: Secondary | ICD-10-CM | POA: Diagnosis not present

## 2020-10-07 DIAGNOSIS — E119 Type 2 diabetes mellitus without complications: Secondary | ICD-10-CM | POA: Diagnosis not present

## 2020-10-07 DIAGNOSIS — G4733 Obstructive sleep apnea (adult) (pediatric): Secondary | ICD-10-CM | POA: Diagnosis not present

## 2020-10-07 DIAGNOSIS — I158 Other secondary hypertension: Secondary | ICD-10-CM | POA: Diagnosis not present

## 2020-10-07 DIAGNOSIS — Z79899 Other long term (current) drug therapy: Secondary | ICD-10-CM | POA: Diagnosis not present

## 2020-10-07 DIAGNOSIS — Z4822 Encounter for aftercare following kidney transplant: Secondary | ICD-10-CM | POA: Diagnosis not present

## 2020-10-07 DIAGNOSIS — Z5181 Encounter for therapeutic drug level monitoring: Secondary | ICD-10-CM | POA: Diagnosis not present

## 2020-10-07 DIAGNOSIS — Z7952 Long term (current) use of systemic steroids: Secondary | ICD-10-CM | POA: Diagnosis not present

## 2020-10-08 DIAGNOSIS — L821 Other seborrheic keratosis: Secondary | ICD-10-CM | POA: Diagnosis not present

## 2020-10-08 DIAGNOSIS — C44612 Basal cell carcinoma of skin of right upper limb, including shoulder: Secondary | ICD-10-CM | POA: Diagnosis not present

## 2020-10-08 DIAGNOSIS — D485 Neoplasm of uncertain behavior of skin: Secondary | ICD-10-CM | POA: Diagnosis not present

## 2020-10-08 DIAGNOSIS — L820 Inflamed seborrheic keratosis: Secondary | ICD-10-CM | POA: Diagnosis not present

## 2020-10-09 DIAGNOSIS — I10 Essential (primary) hypertension: Secondary | ICD-10-CM | POA: Diagnosis not present

## 2020-10-09 DIAGNOSIS — E1165 Type 2 diabetes mellitus with hyperglycemia: Secondary | ICD-10-CM | POA: Diagnosis not present

## 2020-10-09 DIAGNOSIS — E7800 Pure hypercholesterolemia, unspecified: Secondary | ICD-10-CM | POA: Diagnosis not present

## 2020-10-27 DIAGNOSIS — Z940 Kidney transplant status: Secondary | ICD-10-CM | POA: Diagnosis not present

## 2020-11-16 DIAGNOSIS — G4733 Obstructive sleep apnea (adult) (pediatric): Secondary | ICD-10-CM | POA: Diagnosis not present

## 2020-11-24 ENCOUNTER — Other Ambulatory Visit: Payer: Self-pay

## 2020-11-24 DIAGNOSIS — R634 Abnormal weight loss: Secondary | ICD-10-CM | POA: Diagnosis not present

## 2020-11-24 DIAGNOSIS — Z940 Kidney transplant status: Secondary | ICD-10-CM | POA: Diagnosis not present

## 2020-11-24 DIAGNOSIS — E1129 Type 2 diabetes mellitus with other diabetic kidney complication: Secondary | ICD-10-CM | POA: Diagnosis not present

## 2020-11-24 DIAGNOSIS — B348 Other viral infections of unspecified site: Secondary | ICD-10-CM | POA: Diagnosis not present

## 2020-11-24 DIAGNOSIS — N189 Chronic kidney disease, unspecified: Secondary | ICD-10-CM | POA: Diagnosis not present

## 2020-11-24 DIAGNOSIS — R809 Proteinuria, unspecified: Secondary | ICD-10-CM | POA: Diagnosis not present

## 2020-11-24 DIAGNOSIS — K922 Gastrointestinal hemorrhage, unspecified: Secondary | ICD-10-CM | POA: Diagnosis not present

## 2020-11-24 DIAGNOSIS — Z23 Encounter for immunization: Secondary | ICD-10-CM | POA: Diagnosis not present

## 2020-11-24 DIAGNOSIS — D631 Anemia in chronic kidney disease: Secondary | ICD-10-CM | POA: Diagnosis not present

## 2020-11-24 DIAGNOSIS — N2581 Secondary hyperparathyroidism of renal origin: Secondary | ICD-10-CM | POA: Diagnosis not present

## 2020-11-24 DIAGNOSIS — I10 Essential (primary) hypertension: Secondary | ICD-10-CM | POA: Diagnosis not present

## 2020-11-25 ENCOUNTER — Other Ambulatory Visit: Payer: Self-pay

## 2020-11-25 ENCOUNTER — Encounter: Payer: Self-pay | Admitting: Cardiology

## 2020-11-25 ENCOUNTER — Ambulatory Visit: Payer: Medicare Other | Admitting: Cardiology

## 2020-11-25 VITALS — BP 152/64 | HR 72 | Ht 71.0 in | Wt 254.0 lb

## 2020-11-25 DIAGNOSIS — I158 Other secondary hypertension: Secondary | ICD-10-CM | POA: Diagnosis not present

## 2020-11-25 DIAGNOSIS — I259 Chronic ischemic heart disease, unspecified: Secondary | ICD-10-CM | POA: Diagnosis not present

## 2020-11-25 DIAGNOSIS — D849 Immunodeficiency, unspecified: Secondary | ICD-10-CM | POA: Diagnosis not present

## 2020-11-25 DIAGNOSIS — I272 Pulmonary hypertension, unspecified: Secondary | ICD-10-CM

## 2020-11-25 DIAGNOSIS — Z949 Transplanted organ and tissue status, unspecified: Secondary | ICD-10-CM

## 2020-11-25 DIAGNOSIS — Z794 Long term (current) use of insulin: Secondary | ICD-10-CM | POA: Diagnosis not present

## 2020-11-25 DIAGNOSIS — E119 Type 2 diabetes mellitus without complications: Secondary | ICD-10-CM

## 2020-11-25 NOTE — Patient Instructions (Signed)

## 2020-11-25 NOTE — Progress Notes (Signed)
Cardiology Office Note:    Date:  11/25/2020   ID:  Jon [name], DOB 1950-02-01, MRN [id number]  PCP:  [name], MD  Cardiologist:  [name], MD    Referring MD: [name], MD   Chief Complaint  Patient presents with   Follow-up  I am doing well  History of Present Illness:    Jon [name] is a 70 y.o. male  with past medical history significant for renal transplant, ascending aortic aneurysm measuring 40 mm, essential hypertension, diabetes, pulmonary hypertension, however latest echocardiogram did not show an elevation of pulmonary artery pressure.  He was referred to [place] for evaluation before elective back surgery.  He did have surgery done few weeks ago doing very well he is feeling about 75% better from pain point review in terms of his back surgery.  Course was uncomplicated.  As a part of evaluation before surgery he had a stress test which showed no evidence of ischemia, he also had echocardiogram done which showed preserved ejection fraction. He comes today to my office for follow-up.  Overall he is doing very well.  He denies have any chest pain tightness squeezing pressure burning chest.  He is able to walk climb stairs as usual with some shortness of breath but otherwise doing well  Past Medical History:  Diagnosis Date   Abnormal gait    Acute blood loss anemia    Acute renal failure superimposed on stage 4 chronic kidney disease ([place]) 06/04/2017   Acute respiratory failure with hypoxia ([place]) 01/04/2019   Covid pneumonia Dec, 2020   Acute right ankle pain 12/08/2016   AKI (acute kidney injury) ([place]) 12/27/2018   Anemia of chronic disease    Arteriovenous fistula for hemodialysis in place, primary [facility]) 08/08/2017   Overview:  Left upper extremity  Formatting of this note might be different from the original. Left upper extremity   Arthritis    back, hands    Ascending aortic aneurysm 10/23/2017   3.9 cm by echocardiogram   Backache  01/27/2011   BK viremia 02/07/2014   Body mass index (BMI) 36.0-36.9, adult 01/16/2020   Cerebral infarction ([place]) 08/08/2017   Cerebrovascular accident (CVA) ([place])    Cervical spondylosis 12/27/2012   Chronic back pain    radiculopathy and stenosis   Chronic ischemic heart disease    Severe LVH   Chronic kidney disease 2015   transplant   Congestive heart failure (CHF) ([place])    COVID-19 virus infection    CVA (cerebral infarction) 03/2013   Diabetes mellitus     Lantus nightly ;type 2   Diabetes mellitus ([place]) 01/23/2011   Formatting of this note might be different from the original. Currently on Insulin   Digital mucinous cyst of finger of left hand 10/10/2016   Duodenal ulcer hemorrhagic    Dysphagia 08/22/2017   Dyspnea    Dyspnea on exertion 08/24/2016   PFT 12/28/16-mild restriction, mild reduction of diffusion.  No response to bronchodilator.  FVC 3.28/70%, FEV1 2.64/75%, ratio 0.81, TLC 77%, DLCO 66%.   Elevated blood-pressure reading, without diagnosis of hypertension 02/25/2019   Elevated LFTs    Elevated serum creatinine 10/24/2013   Essential (primary) hypertension 11/27/2018   takes Amlodipine and Catapres daily    dr Forrestine Him, Loop Recorder - [facility]   Finger infection, fungal 11/22/2016   Fungal intestinal infection following organ transplantation 11/29/2016   GERD (gastroesophageal reflux disease)    GI bleed  5 units oblood    H/O hiatal hernia    Heart murmur    History of blood transfusion    History of colon polyps    History of kidney stones    passed   History of kidney transplant 01/24/2011   S/P renal transplant in 2015 at [facility]- followed by Dr Moshe Cipro at [facility]   History of stroke 03/16/2013   HLD (hyperlipidemia) 05/02/2013   HTN (hypertension) 01/27/2011   Hx of cardiovascular stress test    a.  Lexiscan Myoview (05/2013):  No ischemia, EF 53%; Normal Study   Hyperkalemia 01/23/2011   Hyperlipidemia    takes Lovastatin daily    Hypertension    takes Amlodipine and Catapres daily    dr Forrestine Him, Loop Recorder - [facility]   Hypertension associated with transplantation 12/03/2014   Hypomagnesemia 10/25/2013   Immunosuppression ([place]) 10/14/2013   Immunosuppressive management encounter following kidney transplant 10/21/2013   Insulin dependent type 2 diabetes mellitus ([place]) 01/28/2014   Overview:  Currently on Insulin   Lumbago with sciatica, right side 12/17/2019   Lumbar post-laminectomy syndrome 05/13/2015   Mass of soft tissue of left upper extremity 10/18/2016   Melena    Movement disorder 03/16/2013   Nocturia    Obesity    OSA (obstructive sleep apnea) 05/02/2013   Unattended Home Sleep Test 10/24/12-AHI 24/hour, desaturation to 81%.   Osteoarthritis (arthritis due to wear and tear of joints) 08/08/2017   Other chronic pain 12/17/2019   Pain at surgical site 10/01/2013   Peripheral edema    takes Lasix daily   Personal history of COVID-19    Pneumonia 2021   Pneumonia due to COVID-19 virus    Preop cardiovascular exam 03/22/2013   Pulmonary hypertension, unspecified ([place]) 01/23/2017   Scapholunate dissociation of left wrist 10/18/2016   Sleep apnea    cpap , study in their home, 09/2102- [employer]            Spinal stenosis 01/16/2020   Formatting of this note might be different from the original. Operated twice with sciatica with disectomy of lumbars   Stroke ([place]) 2015   speech, rt arm weakness- 04/07/20 - no residual    Swelling of lower extremity 06/30/2016   Upper GI bleed 01/20/2019   URI, acute 03/07/2014   Urinary frequency     Past Surgical History:  Procedure Laterality Date   AV FISTULA PLACEMENT Left 04/03/2013   Procedure: ARTERIOVENOUS (AV) FISTULA CREATION- LEFT RADIOCEPHALIC VS BRACHIOCEPHALIC;  Surgeon: Conrad Fayette, MD;  Location: [place];  Service: Vascular;  Laterality: Left;   AV FISTULA PLACEMENT Left 05/14/2013   Procedure: LEFT BRACHIOCEPHALIC ARTERIOVENOUS (AV) FISTULA CREATION;  Surgeon:  Conrad Buena, MD;  Location: [place];  Service: Vascular;  Laterality: Left;   BACK SURGERY  12/2012   2003- 1st back surgery & then 2014- fusion   COLONOSCOPY     ESOPHAGOGASTRODUODENOSCOPY     ESOPHAGOGASTRODUODENOSCOPY (EGD) WITH PROPOFOL N/A 01/22/2019   Procedure: ESOPHAGOGASTRODUODENOSCOPY (EGD) WITH PROPOFOL;  Surgeon: [name], MD;  Location: [place];  Service: Gastroenterology;  Laterality: N/A;   HEMORRHOID SURGERY     HEMOSTASIS CLIP PLACEMENT  01/22/2019   Procedure: HEMOSTASIS CLIP PLACEMENT;  Surgeon: [name], MD;  Location: [facility] ENDOSCOPY;  Service: Gastroenterology;;   KIDNEY TRANSPLANT  10/01/2013   left knee surgery     LOOP RECORDER IMPLANT  03/19/13   MDT LinQ implanted by Dr Rayann Heman for cryptogenic stroke   LOOP  RECORDER IMPLANT N/A 03/19/2013   Procedure: LOOP RECORDER IMPLANT;  Surgeon: Coralyn Mark, MD;  Location: [place] CATH LAB;  Service: Cardiovascular;  Laterality: N/A;   LUMBAR FUSION  04/08/2020   Dr. Newman Pies   NEPHRECTOMY TRANSPLANTED ORGAN     NEUROPLASTY / TRANSPOSITION MEDIAN NERVE AT CARPAL TUNNEL BILATERAL     right leg surgery     pin in place   right wrist surgery     SCLEROTHERAPY  01/22/2019   Procedure: SCLEROTHERAPY;  Surgeon: [name], MD;  Location: [place];  Service: Gastroenterology;;   TEE WITHOUT CARDIOVERSION N/A 03/19/2013   Procedure: TRANSESOPHAGEAL ECHOCARDIOGRAM (TEE);  Surgeon: [name], MD;  Location[facility] ENDOSCOPY;  Service: Cardiovascular;  Laterality: N/A;   THYROID SURGERY  10/13/2015   Performed at [facility] with surgical pathology revealed consistent with benign follicular nodule ([name] category II)    Current Medications: Current Meds  Medication Sig   amLODipine (NORVASC) 10 MG tablet Take 10 mg by mouth daily.   aspirin 81 MG EC tablet Take 81 mg by mouth daily. Swallow whole.   atorvastatin (LIPITOR) 40 MG tablet Take 40 mg by mouth at bedtime.   calcitRIOL (ROCALTROL) 0.25 MCG capsule Take  0.25 mcg by mouth every Monday, Tuesday, Wednesday, Thursday, and Friday.   cholecalciferol (VITAMIN D3) 25 MCG (1000 UNIT) tablet Take 1,000 Units by mouth daily.   furosemide (LASIX) 40 MG tablet Take 1 tablet (40 mg total) by mouth daily.   gabapentin (NEURONTIN) 300 MG capsule Take 300 mg by mouth 3 (three) times daily.   gabapentin (NEURONTIN) 300 MG capsule Take 1 capsule by mouth 3 (three) times daily.   gabapentin (NEURONTIN) 300 MG capsule Take 300 mg by mouth 3 (three) times daily.   HYDROcodone-acetaminophen (NORCO) 10-325 MG tablet Take 1-2 tablets by mouth every 4 (four) hours as needed for moderate pain.   Insulin Glargine (BASAGLAR KWIKPEN) 100 UNIT/ML Inject 40 Units into the skin at bedtime.   insulin lispro (HUMALOG KWIKPEN) 100 UNIT/ML KwikPen Inject 5 Units into the skin See admin instructions. Sliding scale over 150 add 2 units Three times a day   labetalol (NORMODYNE) 100 MG tablet Take 100 mg by mouth 2 (two) times daily.   methimazole (TAPAZOLE) 5 MG tablet Take 2.5 mg by mouth daily.    Multiple Vitamin (MULTIVITAMIN WITH MINERALS) TABS tablet Take 1 tablet by mouth daily. Unknown strength   mycophenolate (MYFORTIC) 180 MG EC tablet Take 180 mg by mouth 2 (two) times daily.    ONETOUCH ULTRA test strip 1 each by Other route as needed for other (Glucose check).   pantoprazole (PROTONIX) 40 MG tablet Take 1 tablet (40 mg total) by mouth 2 (two) times daily before a meal.   predniSONE (DELTASONE) 5 MG tablet Take 1 tablet (5 mg total) by mouth daily with breakfast.   St Johns Wort 300 MG CAPS Take 300 mg by mouth 3 (three) times daily.   sulfamethoxazole-trimethoprim (BACTRIM) 400-80 MG tablet Take 1 tablet by mouth every Monday, Wednesday, and Friday.   tacrolimus (PROGRAF) 1 MG capsule Take 2 mg by mouth 2 (two) times daily.   tamsulosin (FLOMAX) 0.4 MG CAPS capsule Take 0.4 mg by mouth at bedtime.   TRADJENTA 5 MG TABS tablet Take 5 mg by mouth daily.   vitamin B-12  (CYANOCOBALAMIN) 1000 MCG tablet Take 1,000 mcg by mouth 2 (two) times daily.    vitamin C (ASCORBIC ACID) 500 MG tablet Take 500 mg  by mouth daily.   Zinc 30 MG CAPS Take 30 mg by mouth 2 (two) times daily.     Allergies:   Lisinopril, Meloxicam, Victoza [liraglutide], and Cyclobenzaprine   Social History   Socioeconomic History   Marital status: Married    Spouse name: agnes   Number of children: 5   Years of education: 8th   Highest education level: Not on file  Occupational History   Occupation: disabled  Tobacco Use   Smoking status: Former    Packs/day: 0.50    Years: 40.00    Pack years: 20.00    Types: Cigarettes    Quit date: 12/27/2012    Years since quitting: 7.9   Smokeless tobacco: Never  Vaping Use   Vaping Use: Never used  Substance and Sexual Activity   Alcohol use: No    Alcohol/week: 0.0 standard drinks   Drug use: No   Sexual activity: Yes  Other Topics Concern   Not on file  Social History Narrative   Patient lives at home with his wife Herbert Pun).  One story home with basement.   Patient is disabled.   Education 8th grade.   Right handed.   Caffeine One cup of coffee daily.   Retired Administrator.   5 children.   Social Determinants of Health   Financial Resource Strain: Not on file  Food Insecurity: Not on file  Transportation Needs: Not on file  Physical Activity: Not on file  Stress: Not on file  Social Connections: Not on file     Family History: The patient's family history includes Diabetes in his brother; Heart disease in his father; Hyperlipidemia in his father and son; Hypertension in his father and mother; Renal Disease in his father. ROS:   Please see the history of present illness.    All 14 point review of systems negative except as described per history of present illness  EKGs/Labs/Other Studies Reviewed:      Recent Labs: 09/03/2020: ALT 26; BUN 34; Creatinine 2.3; Hemoglobin 10.5; Platelets 136; Potassium 4.5; Sodium  140  Recent Lipid Panel    Component Value Date/Time   CHOL 137 01/02/2017 0343   TRIG 113 01/04/2019 0011   HDL 38 (L) 01/02/2017 0343   CHOLHDL 3.6 01/02/2017 0343   VLDL 16 01/02/2017 0343   LDLCALC 83 01/02/2017 0343    Physical Exam:    VS:  BP (!) 152/64 (BP Location: Right Arm, Patient Position: Sitting)   Pulse 72   Ht 5\' 11"  (1.803 m)   Wt 254 lb (115.2 kg)   SpO2 93%   BMI 35.43 kg/m     Wt Readings from Last 3 Encounters:  11/25/20 254 lb (115.2 kg)  09/25/20 244 lb 4 oz (110.8 kg)  09/18/20 251 lb 8 oz (114.1 kg)     GEN:  Well nourished, well developed in no acute distress HEENT: Normal NECK: No JVD; No carotid bruits LYMPHATICS: No lymphadenopathy CARDIAC: RRR, no murmurs, no rubs, no gallops RESPIRATORY:  Clear to auscultation without rales, wheezing or rhonchi  ABDOMEN: Soft, non-tender, non-distended MUSCULOSKELETAL:  No edema; No deformity  SKIN: Warm and dry LOWER EXTREMITIES: no swelling NEUROLOGIC:  Alert and oriented x 3 PSYCHIATRIC:  Normal affect   ASSESSMENT:    1. Hypertension associated with transplantation   2. Insulin dependent type 2 diabetes mellitus ([place])   3. Immunosuppression ([place])   4. Pulmonary hypertension, unspecified ([place])   5. Chronic ischemic heart disease    PLAN:  In order of problems listed above:  Essential hypertension associated with acute transplant.  He is blood pressure slightly elevated today but he checked his blood pressure religiously at home it is always good.  We will continue monitoring Type 2 diabetes, followed by antimedicine team.  His hemoglobin A1c from August is 6.9%.  Slightly higher but he is working on getting Better control of this problem Dyslipidemia he is on Lipitor 40 which I will continue his LDL is 70 HDL 35 this is from April of this year we will continue present management. History of pulmonary hypertension however last echocardiogram did not show that.   Medication Adjustments/Labs  and Tests Ordered: Current medicines are reviewed at length with the patient today.  Concerns regarding medicines are outlined above.  No orders of the defined types were placed in this encounter.  Medication changes: No orders of the defined types were placed in this encounter.   Signed, Park Liter, MD, [facility] 11/25/2020 1:57 PM    [place]

## 2020-11-26 DIAGNOSIS — D0439 Carcinoma in situ of skin of other parts of face: Secondary | ICD-10-CM | POA: Diagnosis not present

## 2020-12-01 DIAGNOSIS — M48062 Spinal stenosis, lumbar region with neurogenic claudication: Secondary | ICD-10-CM | POA: Diagnosis not present

## 2020-12-01 DIAGNOSIS — M4316 Spondylolisthesis, lumbar region: Secondary | ICD-10-CM | POA: Diagnosis not present

## 2020-12-01 NOTE — Progress Notes (Signed)
[place]  [address] [place],  [place]  [address] [phone]  Clinic Day:  12/10/2020  Referring physician: [name], MD  This document serves as a record of services personally performed by Leslieanne Cobarrubias Macarthur Critchley, MD. It was created on their behalf by [facility] E, a trained medical scribe. The creation of this record is based on the scribe's personal observations and the provider's statements to them.  HISTORY OF PRESENT ILLNESS:  The patient is a 70 y.o. male with anemia secondary to iron deficiency and chronic renal insufficiency.  He comes in today to reassess his hemoglobin after recently receiving a course of IV iron.  Since his last visit, the patient has been doing fine.  He denies having increased fatigue or any overt forms of blood loss which concern him for progressive anemia.  PHYSICAL EXAM:  Blood pressure (!) 167/73, pulse 71, temperature 97.8 F (36.6 C), resp. rate 16, height 5\' 11"  (1.803 m), weight 252 lb 4.8 oz (114.4 kg), SpO2 94 %. Wt Readings from Last 3 Encounters:  12/10/20 252 lb 4.8 oz (114.4 kg)  11/25/20 254 lb (115.2 kg)  09/25/20 244 lb 4 oz (110.8 kg)   Body mass index is 35.19 kg/m. Performance status (ECOG): 1 - Symptomatic but completely ambulatory Physical Exam Constitutional:      General: He is not in acute distress.    Appearance: Normal appearance. He is normal weight.  HENT:     Head: Normocephalic and atraumatic.  Eyes:     General: No scleral icterus.    Extraocular Movements: Extraocular movements intact.     Conjunctiva/sclera: Conjunctivae normal.     Pupils: Pupils are equal, round, and reactive to light.  Cardiovascular:     Rate and Rhythm: Normal rate and regular rhythm.     Pulses: Normal pulses.     Heart sounds: Normal heart sounds. No murmur heard.   No friction rub. No gallop.  Pulmonary:     Effort: Pulmonary effort is normal. No respiratory distress.     Breath sounds:  Normal breath sounds.  Abdominal:     General: Bowel sounds are normal. There is no distension.     Palpations: Abdomen is soft. There is no hepatomegaly, splenomegaly or mass.     Tenderness: There is no abdominal tenderness.  Musculoskeletal:        General: Normal range of motion.     Cervical back: Normal range of motion and neck supple.     Right lower leg: No edema.     Left lower leg: No edema.  Lymphadenopathy:     Cervical: No cervical adenopathy.  Skin:    General: Skin is warm and dry.  Neurological:     General: No focal deficit present.     Mental Status: He is alert and oriented to person, place, and time. Mental status is at baseline.  Psychiatric:        Mood and Affect: Mood normal.        Behavior: Behavior normal.        Thought Content: Thought content normal.        Judgment: Judgment normal.    LABS:   CBC Latest Ref Rng & Units 12/10/2020 09/03/2020 05/04/2020  WBC - 11.6 7.0 7.6  Hemoglobin 13.5 - 17.5 12.4(A) 10.5(A) 11.1(A)  Hematocrit 41 - 53 38(A) 32(A) 35(A)  Platelets 150 - 399 133(A) 136(A) 147(A)   CMP Latest Ref Rng & Units 12/10/2020  09/03/2020 05/04/2020  Glucose 70 - 99 mg/dL - - -  BUN 4 - 21 44(A) 34(A) 30(A)  Creatinine 0.6 - 1.3 2.5(A) 2.3(A) 2.0(A)  Sodium 137 - 147 142 140 140  Potassium 3.4 - 5.3 4.5 4.5 4.0  Chloride 99 - 108 107 105 106  CO2 13 - 22 20 24(A) 26(A)  Calcium 8.7 - 10.7 8.7 9.2 8.7  Total Protein 6.5 - 8.1 g/dL - - -  Total Bilirubin 0.3 - 1.2 mg/dL - - -  Alkaline Phos 25 - 125 109 112 155(A)  AST 14 - 40 35 28 25  ALT 10 - 40 35 26 23    Latest Reference Range & Units 09/03/20 10:08 12/10/20 09:53  Iron 45 - 182 ug/dL 42 (L) 62  UIBC ug/dL 342 216  TIBC 250 - 450 ug/dL 384 278  Saturation Ratios 17.9 - 39.5 % 11 (L) 22  Ferritin 24 - 336 ng/mL 22 (L) 246   ASSESSMENT & PLAN:  Assessment/Plan:  A 70 y.o. male with anemia secondary to iron deficiency and chronic renal insufficiency.  His labs today clearly  show an improvement in his hemoglobin and iron levels since recently receiving his IV iron, for which he was pleased.   As he has not undergone a colonoscopy in over 5 years, he will be referred to gastroenterology to undergo a workup for his recent iron deficiency anemia.  As it pertains to his transplanted kidney, he knows it remains imperative to keep his hypertension and diabetes under tight control in order to prevent its function from declining over time, which would also exacerbate his anemia.  Clinically, he appears to be doing well.  I will see him back in 4 months for repeat clinical assessment.  The patient understands all the plans discussed today and is in agreement with them.     I, Rita Ohara, am acting as scribe for Marice Potter, MD    I have reviewed this report as typed by the medical scribe, and it is complete and accurate.  Tamma Brigandi Macarthur Critchley, MD

## 2020-12-09 DIAGNOSIS — I509 Heart failure, unspecified: Secondary | ICD-10-CM | POA: Diagnosis not present

## 2020-12-09 DIAGNOSIS — E7800 Pure hypercholesterolemia, unspecified: Secondary | ICD-10-CM | POA: Diagnosis not present

## 2020-12-09 DIAGNOSIS — C44612 Basal cell carcinoma of skin of right upper limb, including shoulder: Secondary | ICD-10-CM | POA: Diagnosis not present

## 2020-12-10 ENCOUNTER — Inpatient Hospital Stay: Payer: Medicare Other | Attending: Oncology | Admitting: Oncology

## 2020-12-10 ENCOUNTER — Inpatient Hospital Stay: Payer: Medicare Other

## 2020-12-10 ENCOUNTER — Telehealth: Payer: Self-pay | Admitting: Oncology

## 2020-12-10 ENCOUNTER — Other Ambulatory Visit: Payer: Self-pay

## 2020-12-10 ENCOUNTER — Encounter: Payer: Self-pay | Admitting: Oncology

## 2020-12-10 VITALS — BP 167/73 | HR 71 | Temp 97.8°F | Resp 16 | Ht 71.0 in | Wt 252.3 lb

## 2020-12-10 DIAGNOSIS — N189 Chronic kidney disease, unspecified: Secondary | ICD-10-CM | POA: Diagnosis not present

## 2020-12-10 DIAGNOSIS — D631 Anemia in chronic kidney disease: Secondary | ICD-10-CM

## 2020-12-10 DIAGNOSIS — D509 Iron deficiency anemia, unspecified: Secondary | ICD-10-CM | POA: Insufficient documentation

## 2020-12-10 LAB — BASIC METABOLIC PANEL
BUN: 44 — AB (ref 4–21)
CO2: 20 (ref 13–22)
Chloride: 107 (ref 99–108)
Creatinine: 2.5 — AB (ref 0.6–1.3)
Glucose: 88
Potassium: 4.5 (ref 3.4–5.3)
Sodium: 142 (ref 137–147)

## 2020-12-10 LAB — CBC: RBC: 4.22 (ref 3.87–5.11)

## 2020-12-10 LAB — COMPREHENSIVE METABOLIC PANEL
Albumin: 4.2 (ref 3.5–5.0)
Calcium: 8.7 (ref 8.7–10.7)

## 2020-12-10 LAB — CBC AND DIFFERENTIAL
HCT: 38 — AB (ref 41–53)
Hemoglobin: 12.4 — AB (ref 13.5–17.5)
Neutrophils Absolute: 9.98
Platelets: 133 — AB (ref 150–399)
WBC: 11.6

## 2020-12-10 LAB — HEPATIC FUNCTION PANEL
ALT: 35 (ref 10–40)
AST: 35 (ref 14–40)
Alkaline Phosphatase: 109 (ref 25–125)
Bilirubin, Total: 0.6

## 2020-12-10 LAB — IRON AND TIBC
Iron: 62 ug/dL (ref 45–182)
Saturation Ratios: 22 % (ref 17.9–39.5)
TIBC: 278 ug/dL (ref 250–450)
UIBC: 216 ug/dL

## 2020-12-10 LAB — FERRITIN: Ferritin: 246 ng/mL (ref 24–336)

## 2020-12-10 NOTE — Telephone Encounter (Signed)
Per 12/1 LOS, patient scheduled for April 2023 Appt's.  Gave patient Appt Summary

## 2020-12-13 ENCOUNTER — Encounter: Payer: Self-pay | Admitting: Oncology

## 2020-12-25 DIAGNOSIS — Z981 Arthrodesis status: Secondary | ICD-10-CM | POA: Diagnosis not present

## 2021-01-08 DIAGNOSIS — I10 Essential (primary) hypertension: Secondary | ICD-10-CM | POA: Diagnosis not present

## 2021-01-08 DIAGNOSIS — E1165 Type 2 diabetes mellitus with hyperglycemia: Secondary | ICD-10-CM | POA: Diagnosis not present

## 2021-01-08 DIAGNOSIS — E7800 Pure hypercholesterolemia, unspecified: Secondary | ICD-10-CM | POA: Diagnosis not present

## 2021-01-18 DIAGNOSIS — E1129 Type 2 diabetes mellitus with other diabetic kidney complication: Secondary | ICD-10-CM | POA: Diagnosis not present

## 2021-01-18 DIAGNOSIS — E7800 Pure hypercholesterolemia, unspecified: Secondary | ICD-10-CM | POA: Diagnosis not present

## 2021-01-18 DIAGNOSIS — Z0000 Encounter for general adult medical examination without abnormal findings: Secondary | ICD-10-CM | POA: Diagnosis not present

## 2021-01-18 DIAGNOSIS — I259 Chronic ischemic heart disease, unspecified: Secondary | ICD-10-CM | POA: Diagnosis not present

## 2021-01-22 NOTE — Progress Notes (Signed)
HPI  male former smoker followed for OSA, complicated by ESRD/Transplant, DM 2, GERD, CVA, chronic back pain Unattended Home Sleep Test 10/24/12-AHI 24/hour, desaturation to 81%. PFT 12/28/16-mild restriction, mild reduction of diffusion.  No response to bronchodilator.  FVC 3.28/70%, FEV1 2.64/75%, ratio 0.81, TLC 77%, DLCO 66%. -------------------------------------------------------------------------------   01/24/20-  71 year old male former smoker followed for OSA, DOE complicated by ESRD/transplant, DM 2, GERD, CVA, chronic back pain,HTN, Aortic Aneurysm,  Pulmonary Hypertension , Covid pneumonia 2020,  f/u OSA CPAP auto 5-20/ APS Download- compliance 97%, AHI 1.4/ hr Body weight today- 258 lbs Covid vax- none Flu vax-had Pending lumbar laminectomy Cardiology updating Echo to check on PHTN not noted on last Echo. ------Osa,denies problems Headgear is worn and stretching, but he has replacement ready. Full face mask and pressures are good. Machine about 70 years old. Pending back surgery- no date set. Heats with wood. Wife canned "1100 jars" this year- "we don't go hungry". I offered to answer any vaccine questions- he had none.  01/25/21- 71 year old male former smoker(20 pkyrs) followed for OSA, DOE complicated by ESRD/transplant, DM 2, GERD, CVA, chronic back pain, HTN, Aortic Aneurysm,  Pulmonary Hypertension, Covid pneumonia 2020, Anemia,  f/u OSA CPAP auto 5-20/ APS      AirSense 10 Auto Download- compliance 100%, AHI 1.3/ hr Body weight today- 253 lbs Covid vax- none Flu vax-had He and wife both state he is doing very well with CPAP, with no concerns expressed.  Download reviewed.  Good compliance and control.  Machine now 70 years old. Transplanted kidney still doing well. (Wife canned 900 jars this year)  ROS-see HPI   + = positive Constitutional:    weight loss, night sweats, fevers, chills, fatigue, lassitude. HEENT:    headaches, difficulty swallowing, tooth/dental  problems, sore throat,       sneezing, itching, ear ache, nasal congestion, post nasal drip, snoring CV:    chest pain, orthopnea, PND, swelling in lower extremities, anasarca,                                                dizziness, palpitations Resp:   shortness of breath with exertion or at rest.                productive cough,   non-productive cough, coughing up of blood.              change in color of mucus.  wheezing.   Skin:    rash or lesions. GI:  No-   heartburn, indigestion, abdominal pain, nausea, vomiting, diarrhea,                 change in bowel habits, loss of appetite GU: dysuria, change in color of urine, no urgency or frequency.   flank pain. MS:   joint pain, stiffness, decreased range of motion, back pain. Neuro-     nothing unusual Psych:  change in mood or affect.  depression or anxiety.   memory loss.  OBJ- Physical Exam General- Alert, Oriented, Affect-appropriate, Distress- none acute, +overweight Skin-  Lymphadenopathy- none Head- atraumatic            Eyes- Gross vision intact, PERRLA, conjunctivae and secretions clear            Ears- Hearing, canals-normal            Nose- Clear,  no-Septal dev, mucus, polyps, erosion, perforation             Throat- Mallampati IV , mucosa clear , drainage- none, tonsils- atrophic, + full dentures Neck- flexible , trachea midline, no stridor , thyroid nl, carotid no bruit Chest - symmetrical excursion , unlabored           Heart/CV- RRR , murmur+ 1/6 S Ao , no gallop  , no rub, nl s1 s2                           - JVD- none , edema- none, stasis changes- none, varices- none           Lung- clear to P&A, wheeze- none, cough- none , dullness-none, rub- none           Chest wall- + loop recorder Abd-  Br/ Gen/ Rectal- Not done, not indicated Extrem- cyanosis- none, clubbing, none, atrophy- none, strength- nl Neuro- grossly intact to observation

## 2021-01-25 ENCOUNTER — Ambulatory Visit: Payer: Medicare Other | Admitting: Internal Medicine

## 2021-01-25 ENCOUNTER — Encounter: Payer: Self-pay | Admitting: Internal Medicine

## 2021-01-25 ENCOUNTER — Other Ambulatory Visit: Payer: Self-pay

## 2021-01-25 DIAGNOSIS — G4733 Obstructive sleep apnea (adult) (pediatric): Secondary | ICD-10-CM

## 2021-01-25 DIAGNOSIS — Z6835 Body mass index (BMI) 35.0-35.9, adult: Secondary | ICD-10-CM

## 2021-01-25 DIAGNOSIS — N184 Chronic kidney disease, stage 4 (severe): Secondary | ICD-10-CM | POA: Diagnosis not present

## 2021-01-25 DIAGNOSIS — N179 Acute kidney failure, unspecified: Secondary | ICD-10-CM

## 2021-01-25 NOTE — Assessment & Plan Note (Signed)
Encouraged to try to keep weight down

## 2021-01-25 NOTE — Assessment & Plan Note (Signed)
Benefits from CPAP with continued good compliance and control Plan-continue auto 5-20

## 2021-01-25 NOTE — Patient Instructions (Signed)
.  We can continue CPAP auto 5-20  Glad you are doing well. Please call if we can help

## 2021-01-25 NOTE — Assessment & Plan Note (Signed)
He reports transplant continues to function well.

## 2021-01-27 DIAGNOSIS — Z940 Kidney transplant status: Secondary | ICD-10-CM | POA: Diagnosis not present

## 2021-02-09 DIAGNOSIS — I10 Essential (primary) hypertension: Secondary | ICD-10-CM | POA: Diagnosis not present

## 2021-02-09 DIAGNOSIS — E1165 Type 2 diabetes mellitus with hyperglycemia: Secondary | ICD-10-CM | POA: Diagnosis not present

## 2021-02-09 DIAGNOSIS — E7800 Pure hypercholesterolemia, unspecified: Secondary | ICD-10-CM | POA: Diagnosis not present

## 2021-02-15 DIAGNOSIS — G4733 Obstructive sleep apnea (adult) (pediatric): Secondary | ICD-10-CM | POA: Diagnosis not present

## 2021-02-19 DIAGNOSIS — R809 Proteinuria, unspecified: Secondary | ICD-10-CM | POA: Diagnosis not present

## 2021-02-19 DIAGNOSIS — D631 Anemia in chronic kidney disease: Secondary | ICD-10-CM | POA: Diagnosis not present

## 2021-02-19 DIAGNOSIS — R634 Abnormal weight loss: Secondary | ICD-10-CM | POA: Diagnosis not present

## 2021-02-19 DIAGNOSIS — K922 Gastrointestinal hemorrhage, unspecified: Secondary | ICD-10-CM | POA: Diagnosis not present

## 2021-02-19 DIAGNOSIS — B348 Other viral infections of unspecified site: Secondary | ICD-10-CM | POA: Diagnosis not present

## 2021-02-19 DIAGNOSIS — Z940 Kidney transplant status: Secondary | ICD-10-CM | POA: Diagnosis not present

## 2021-02-19 DIAGNOSIS — E1129 Type 2 diabetes mellitus with other diabetic kidney complication: Secondary | ICD-10-CM | POA: Diagnosis not present

## 2021-02-19 DIAGNOSIS — N2581 Secondary hyperparathyroidism of renal origin: Secondary | ICD-10-CM | POA: Diagnosis not present

## 2021-02-19 DIAGNOSIS — I10 Essential (primary) hypertension: Secondary | ICD-10-CM | POA: Diagnosis not present

## 2021-02-19 DIAGNOSIS — N189 Chronic kidney disease, unspecified: Secondary | ICD-10-CM | POA: Diagnosis not present

## 2021-02-23 DIAGNOSIS — Z981 Arthrodesis status: Secondary | ICD-10-CM | POA: Diagnosis not present

## 2021-03-30 DIAGNOSIS — L570 Actinic keratosis: Secondary | ICD-10-CM | POA: Diagnosis not present

## 2021-03-30 DIAGNOSIS — L578 Other skin changes due to chronic exposure to nonionizing radiation: Secondary | ICD-10-CM | POA: Diagnosis not present

## 2021-03-30 DIAGNOSIS — L821 Other seborrheic keratosis: Secondary | ICD-10-CM | POA: Diagnosis not present

## 2021-04-11 NOTE — Progress Notes (Signed)
?Jon [name]  ?[address] ?[place],  [place]  [address] ?[phone] ? ?Clinic Day:  04/12/2021 ? ?Referring physician: [name], MD ? ?HISTORY OF PRESENT ILLNESS:  ?The patient is a 71 y.o. male with anemia secondary to iron deficiency and chronic renal insufficiency.  In the past, IV iron was effective in replenishing his iron stores and improving his hemoglobin.  He comes in today to reassess his hemoglobin.  Since his last visit, the patient has been doing fine.  He denies having increased fatigue or any overt forms of blood loss which concern him for progressive anemia. ? ?PHYSICAL EXAM:  ?Blood pressure (!) 167/72, pulse 73, temperature 98.3 ?F (36.8 ?C), resp. rate 16, height '5\' 11"'$  (1.803 m), weight 263 lb 4.8 oz (119.4 kg), SpO2 97 %. ?Wt Readings from Last 3 Encounters:  ?04/12/21 263 lb 4.8 oz (119.4 kg)  ?01/25/21 253 lb (114.8 kg)  ?12/10/20 252 lb 4.8 oz (114.4 kg)  ? ?Body mass index is 36.72 kg/m?[name] ?Performance status (ECOG): 1 - Symptomatic but completely ambulatory ?Physical Exam ?Constitutional:   ?   General: He is not in acute distress. ?   Appearance: Normal appearance. He is normal weight.  ?HENT:  ?   Head: Normocephalic and atraumatic.  ?Eyes:  ?   General: No scleral icterus. ?   Extraocular Movements: Extraocular movements intact.  ?   Conjunctiva/sclera: Conjunctivae normal.  ?   Pupils: Pupils are equal, round, and reactive to light.  ?Cardiovascular:  ?   Rate and Rhythm: Normal rate and regular rhythm.  ?   Pulses: Normal pulses.  ?   Heart sounds: Normal heart sounds. No murmur heard. ?  No friction rub. No gallop.  ?Pulmonary:  ?   Effort: Pulmonary effort is normal. No respiratory distress.  ?   Breath sounds: Normal breath sounds.  ?Abdominal:  ?   General: Bowel sounds are normal. There is no distension.  ?   Palpations: Abdomen is soft. There is no hepatomegaly, splenomegaly or mass.  ?   Tenderness: There is no abdominal tenderness.   ?Musculoskeletal:     ?   General: Normal range of motion.  ?   Cervical back: Normal range of motion and neck supple.  ?   Right lower leg: No edema.  ?   Left lower leg: No edema.  ?Lymphadenopathy:  ?   Cervical: No cervical adenopathy.  ?Skin: ?   General: Skin is warm and dry.  ?Neurological:  ?   General: No focal deficit present.  ?   Mental Status: He is alert and oriented to person, place, and time. Mental status is at baseline.  ?Psychiatric:     ?   Mood and Affect: Mood normal.     ?   Behavior: Behavior normal.     ?   Thought Content: Thought content normal.     ?   Judgment: Judgment normal.  ? ? ?LABS:  ? ? ?  Latest Ref Rng & Units 04/12/2021  ? 12:00 AM 12/10/2020  ? 12:00 AM 09/03/2020  ? 12:00 AM  ?CBC  ?WBC  9.3      11.6   7.0    ?Hemoglobin 13.5 - 17.5 12.2      12.4   10.5    ?Hematocrit 41 - 53 37      38   32    ?Platelets 150 - 400 K/uL 148  133   136    ?  ? This result is from an external source.  ? ? ?  Latest Ref Rng & Units 04/12/2021  ? 12:00 AM 12/10/2020  ? 12:00 AM 09/03/2020  ? 12:00 AM  ?CMP  ?BUN 4 - 21 36      44   34    ?Creatinine 0.6 - 1.3 2.1      2.5   2.3    ?Sodium 137 - 147 141      142   140    ?Potassium 3.5 - 5.1 mEq/L 4.8      4.5   4.5    ?Chloride 99 - 108 107      107   105    ?CO2 13 - '22 26      20   24    '$ ?Calcium 8.7 - 10.7 8.7      8.7   9.2    ?Alkaline Phos 25 - 125 118      109   112    ?AST 14 - 40 32      35   28    ?ALT 10 - 40 U/L 43      35   26    ?  ? This result is from an external source.  ? ? Latest Reference Range & Units 04/12/21 09:30  ?Iron 45 - 182 ug/dL 95  ?UIBC ug/dL 224  ?TIBC 250 - 450 ug/dL 319  ?Saturation Ratios 17.9 - 39.5 % 30  ?Ferritin 24 - 336 ng/mL 130  ? ?ASSESSMENT & PLAN:  ?Assessment/Plan:  A 71 y.o. male with anemia secondary to iron deficiency and chronic renal insufficiency.  I am pleased that his hemoglobin remains above 12.  Furthermore, his iron parameters remain normal.  As it pertains to his transplanted kidney, he  knows it remains imperative to keep his hypertension and diabetes under tight control in order to prevent its function from declining over time, which would also exacerbate his anemia.  Clinically, he appears to be doing well.  I will see him back in 6 months for repeat clinical assessment.  The patient understands all the plans discussed today and is in agreement with them.   ? ? ?I, Rita Ohara, am acting as scribe for Marice Potter, MD   ? ?I have reviewed this report as typed by the medical scribe, and it is complete and accurate. ? ?Mandy Peeks Macarthur Critchley, MD ? ? ? ?  ?

## 2021-04-12 ENCOUNTER — Inpatient Hospital Stay: Payer: Medicare Other | Attending: Oncology

## 2021-04-12 ENCOUNTER — Inpatient Hospital Stay: Payer: Medicare Other | Admitting: Oncology

## 2021-04-12 ENCOUNTER — Other Ambulatory Visit: Payer: Self-pay | Admitting: Oncology

## 2021-04-12 VITALS — BP 167/72 | HR 73 | Temp 98.3°F | Resp 16 | Ht 71.0 in | Wt 263.3 lb

## 2021-04-12 DIAGNOSIS — D631 Anemia in chronic kidney disease: Secondary | ICD-10-CM | POA: Insufficient documentation

## 2021-04-12 DIAGNOSIS — E119 Type 2 diabetes mellitus without complications: Secondary | ICD-10-CM | POA: Diagnosis not present

## 2021-04-12 DIAGNOSIS — I10 Essential (primary) hypertension: Secondary | ICD-10-CM | POA: Diagnosis not present

## 2021-04-12 DIAGNOSIS — D509 Iron deficiency anemia, unspecified: Secondary | ICD-10-CM | POA: Diagnosis not present

## 2021-04-12 DIAGNOSIS — D649 Anemia, unspecified: Secondary | ICD-10-CM | POA: Diagnosis not present

## 2021-04-12 DIAGNOSIS — N189 Chronic kidney disease, unspecified: Secondary | ICD-10-CM

## 2021-04-12 DIAGNOSIS — Z940 Kidney transplant status: Secondary | ICD-10-CM | POA: Diagnosis not present

## 2021-04-12 DIAGNOSIS — E611 Iron deficiency: Secondary | ICD-10-CM | POA: Insufficient documentation

## 2021-04-12 DIAGNOSIS — D508 Other iron deficiency anemias: Secondary | ICD-10-CM | POA: Diagnosis not present

## 2021-04-12 LAB — CBC AND DIFFERENTIAL
HCT: 37 — AB (ref 41–53)
Hemoglobin: 12.2 — AB (ref 13.5–17.5)
Neutrophils Absolute: 7.53
Platelets: 148 10*3/uL — AB (ref 150–400)
WBC: 9.3

## 2021-04-12 LAB — BASIC METABOLIC PANEL
BUN: 36 — AB (ref 4–21)
CO2: 26 — AB (ref 13–22)
Chloride: 107 (ref 99–108)
Creatinine: 2.1 — AB (ref 0.6–1.3)
Glucose: 168
Potassium: 4.8 mEq/L (ref 3.5–5.1)
Sodium: 141 (ref 137–147)

## 2021-04-12 LAB — IRON AND TIBC
Iron: 95 ug/dL (ref 45–182)
Saturation Ratios: 30 % (ref 17.9–39.5)
TIBC: 319 ug/dL (ref 250–450)
UIBC: 224 ug/dL

## 2021-04-12 LAB — COMPREHENSIVE METABOLIC PANEL
Albumin: 4.1 (ref 3.5–5.0)
Calcium: 8.7 (ref 8.7–10.7)

## 2021-04-12 LAB — HEPATIC FUNCTION PANEL
ALT: 43 U/L — AB (ref 10–40)
AST: 32 (ref 14–40)
Alkaline Phosphatase: 118 (ref 25–125)
Bilirubin, Total: 7

## 2021-04-12 LAB — CBC: RBC: 4.07 (ref 3.87–5.11)

## 2021-04-12 LAB — FERRITIN: Ferritin: 130 ng/mL (ref 24–336)

## 2021-04-27 DIAGNOSIS — Z940 Kidney transplant status: Secondary | ICD-10-CM | POA: Diagnosis not present

## 2021-05-05 DIAGNOSIS — M4316 Spondylolisthesis, lumbar region: Secondary | ICD-10-CM | POA: Diagnosis not present

## 2021-05-05 DIAGNOSIS — M48062 Spinal stenosis, lumbar region with neurogenic claudication: Secondary | ICD-10-CM | POA: Diagnosis not present

## 2021-05-05 DIAGNOSIS — M25551 Pain in right hip: Secondary | ICD-10-CM | POA: Diagnosis not present

## 2021-05-09 DIAGNOSIS — E1165 Type 2 diabetes mellitus with hyperglycemia: Secondary | ICD-10-CM | POA: Diagnosis not present

## 2021-05-09 DIAGNOSIS — E7800 Pure hypercholesterolemia, unspecified: Secondary | ICD-10-CM | POA: Diagnosis not present

## 2021-05-09 DIAGNOSIS — I10 Essential (primary) hypertension: Secondary | ICD-10-CM | POA: Diagnosis not present

## 2021-05-10 DIAGNOSIS — Z9181 History of falling: Secondary | ICD-10-CM | POA: Diagnosis not present

## 2021-05-10 DIAGNOSIS — Z0000 Encounter for general adult medical examination without abnormal findings: Secondary | ICD-10-CM | POA: Diagnosis not present

## 2021-05-10 DIAGNOSIS — E7800 Pure hypercholesterolemia, unspecified: Secondary | ICD-10-CM | POA: Diagnosis not present

## 2021-05-10 DIAGNOSIS — E1165 Type 2 diabetes mellitus with hyperglycemia: Secondary | ICD-10-CM | POA: Diagnosis not present

## 2021-05-10 DIAGNOSIS — Z79899 Other long term (current) drug therapy: Secondary | ICD-10-CM | POA: Diagnosis not present

## 2021-05-10 DIAGNOSIS — G4733 Obstructive sleep apnea (adult) (pediatric): Secondary | ICD-10-CM | POA: Diagnosis not present

## 2021-05-24 DIAGNOSIS — M25561 Pain in right knee: Secondary | ICD-10-CM | POA: Diagnosis not present

## 2021-05-24 DIAGNOSIS — M79604 Pain in right leg: Secondary | ICD-10-CM | POA: Diagnosis not present

## 2021-05-24 DIAGNOSIS — S80911A Unspecified superficial injury of right knee, initial encounter: Secondary | ICD-10-CM | POA: Diagnosis not present

## 2021-05-24 DIAGNOSIS — M1711 Unilateral primary osteoarthritis, right knee: Secondary | ICD-10-CM | POA: Diagnosis not present

## 2021-05-24 DIAGNOSIS — M25461 Effusion, right knee: Secondary | ICD-10-CM | POA: Diagnosis not present

## 2021-05-26 ENCOUNTER — Ambulatory Visit: Payer: Medicare Other | Admitting: Cardiology

## 2021-06-03 DIAGNOSIS — E0590 Thyrotoxicosis, unspecified without thyrotoxic crisis or storm: Secondary | ICD-10-CM | POA: Diagnosis not present

## 2021-06-03 DIAGNOSIS — Z79899 Other long term (current) drug therapy: Secondary | ICD-10-CM | POA: Diagnosis not present

## 2021-06-18 DIAGNOSIS — E119 Type 2 diabetes mellitus without complications: Secondary | ICD-10-CM | POA: Diagnosis not present

## 2021-06-18 DIAGNOSIS — Z794 Long term (current) use of insulin: Secondary | ICD-10-CM | POA: Diagnosis not present

## 2021-06-24 DIAGNOSIS — R634 Abnormal weight loss: Secondary | ICD-10-CM | POA: Diagnosis not present

## 2021-06-24 DIAGNOSIS — Z940 Kidney transplant status: Secondary | ICD-10-CM | POA: Diagnosis not present

## 2021-06-24 DIAGNOSIS — B348 Other viral infections of unspecified site: Secondary | ICD-10-CM | POA: Diagnosis not present

## 2021-06-24 DIAGNOSIS — K922 Gastrointestinal hemorrhage, unspecified: Secondary | ICD-10-CM | POA: Diagnosis not present

## 2021-06-24 DIAGNOSIS — I10 Essential (primary) hypertension: Secondary | ICD-10-CM | POA: Diagnosis not present

## 2021-06-24 DIAGNOSIS — N189 Chronic kidney disease, unspecified: Secondary | ICD-10-CM | POA: Diagnosis not present

## 2021-06-24 DIAGNOSIS — E1129 Type 2 diabetes mellitus with other diabetic kidney complication: Secondary | ICD-10-CM | POA: Diagnosis not present

## 2021-06-24 DIAGNOSIS — M109 Gout, unspecified: Secondary | ICD-10-CM | POA: Diagnosis not present

## 2021-06-24 DIAGNOSIS — N2581 Secondary hyperparathyroidism of renal origin: Secondary | ICD-10-CM | POA: Diagnosis not present

## 2021-06-24 DIAGNOSIS — R809 Proteinuria, unspecified: Secondary | ICD-10-CM | POA: Diagnosis not present

## 2021-06-24 DIAGNOSIS — D631 Anemia in chronic kidney disease: Secondary | ICD-10-CM | POA: Diagnosis not present

## 2021-07-15 DIAGNOSIS — M4316 Spondylolisthesis, lumbar region: Secondary | ICD-10-CM | POA: Diagnosis not present

## 2021-07-15 DIAGNOSIS — M25551 Pain in right hip: Secondary | ICD-10-CM | POA: Diagnosis not present

## 2021-07-23 DIAGNOSIS — I10 Essential (primary) hypertension: Secondary | ICD-10-CM | POA: Diagnosis not present

## 2021-07-23 DIAGNOSIS — M79671 Pain in right foot: Secondary | ICD-10-CM | POA: Diagnosis not present

## 2021-07-29 DIAGNOSIS — Z940 Kidney transplant status: Secondary | ICD-10-CM | POA: Diagnosis not present

## 2021-07-29 DIAGNOSIS — M109 Gout, unspecified: Secondary | ICD-10-CM | POA: Diagnosis not present

## 2021-08-03 DIAGNOSIS — M25572 Pain in left ankle and joints of left foot: Secondary | ICD-10-CM | POA: Diagnosis not present

## 2021-08-03 DIAGNOSIS — M109 Gout, unspecified: Secondary | ICD-10-CM | POA: Diagnosis not present

## 2021-08-03 DIAGNOSIS — G4733 Obstructive sleep apnea (adult) (pediatric): Secondary | ICD-10-CM | POA: Diagnosis not present

## 2021-08-03 DIAGNOSIS — M79672 Pain in left foot: Secondary | ICD-10-CM | POA: Diagnosis not present

## 2021-08-03 DIAGNOSIS — M25562 Pain in left knee: Secondary | ICD-10-CM | POA: Diagnosis not present

## 2021-08-09 DIAGNOSIS — I10 Essential (primary) hypertension: Secondary | ICD-10-CM | POA: Diagnosis not present

## 2021-08-09 DIAGNOSIS — E1165 Type 2 diabetes mellitus with hyperglycemia: Secondary | ICD-10-CM | POA: Diagnosis not present

## 2021-08-09 DIAGNOSIS — E7800 Pure hypercholesterolemia, unspecified: Secondary | ICD-10-CM | POA: Diagnosis not present

## 2021-08-25 ENCOUNTER — Ambulatory Visit: Payer: Medicare Other | Admitting: Cardiology

## 2021-08-25 ENCOUNTER — Other Ambulatory Visit (HOSPITAL_COMMUNITY): Payer: Self-pay

## 2021-08-25 ENCOUNTER — Encounter: Payer: Self-pay | Admitting: Cardiology

## 2021-08-25 ENCOUNTER — Other Ambulatory Visit: Payer: Self-pay

## 2021-08-25 VITALS — BP 120/62 | HR 66 | Ht 71.0 in | Wt 257.0 lb

## 2021-08-25 DIAGNOSIS — I158 Other secondary hypertension: Secondary | ICD-10-CM

## 2021-08-25 DIAGNOSIS — I5032 Chronic diastolic (congestive) heart failure: Secondary | ICD-10-CM | POA: Diagnosis not present

## 2021-08-25 DIAGNOSIS — I272 Pulmonary hypertension, unspecified: Secondary | ICD-10-CM | POA: Diagnosis not present

## 2021-08-25 DIAGNOSIS — Z949 Transplanted organ and tissue status, unspecified: Secondary | ICD-10-CM | POA: Diagnosis not present

## 2021-08-25 DIAGNOSIS — I7121 Aneurysm of the ascending aorta, without rupture: Secondary | ICD-10-CM

## 2021-08-25 NOTE — Addendum Note (Signed)
Addended by: Edwyna Shell I on: 08/25/2021 10:49 AM   Modules accepted: Orders

## 2021-08-25 NOTE — Patient Instructions (Signed)
Medication Instructions:  Your physician recommends that you continue on your current medications as directed. Please refer to the Current Medication list given to you today.  *If you need a refill on your cardiac medications before your next appointment, please call your pharmacy*   Lab Work: None If you have labs (blood work) drawn today and your tests are completely normal, you will receive your results only by: [place] (if you have MyChart) OR A paper copy in the mail If you have any lab test that is abnormal or we need to change your treatment, we will call you to review the results.   Testing/Procedures: Your physician has requested that you have an echocardiogram. Echocardiography is a painless test that uses sound waves to create images of your heart. It provides your doctor with information about the size and shape of your heart and how well your heart's chambers and valves are working. This procedure takes approximately one hour. There are no restrictions for this procedure.    Follow-Up: At [facility], you and your health needs are our priority.  As part of our continuing mission to provide you with exceptional heart care, we have created designated Provider Care Teams.  These Care Teams include your primary Cardiologist (physician) and Advanced Practice Providers (APPs -  Physician Assistants and Nurse Practitioners) who all work together to provide you with the care you need, when you need it.  We recommend signing up for the patient portal called "MyChart".  Sign up information is provided on this After Visit Summary.  MyChart is used to connect with patients for Virtual Visits (Telemedicine).  Patients are able to view lab/test results, encounter notes, upcoming appointments, etc.  Non-urgent messages can be sent to your provider as well.   To learn more about what you can do with MyChart, go to [email or web address].    Your next appointment:   6  month(s)  The format for your next appointment:   In Person  Provider:   [name], MD    Other Instructions None  Important Information About Sugar

## 2021-08-25 NOTE — Progress Notes (Signed)
Cardiology Office Note:    Date:  08/25/2021   ID:  Jon [name], DOB [birth year], MRN [id number]  PCP:  Jon Sheriff, MD  Cardiologist:  Jon Campus, MD    Referring MD: Jon Sheriff, MD   Chief Complaint  Patient presents with   Follow-up  Doing fine  History of Present Illness:    Jon [name] is a 71 y.o. male with past medical history significant for renal transplant which was done 8 years ago, ascending aortic aneurysm measuring 40 mm, essential hypertension, diabetes, pulmonary hypertension however latest echocardiogram did not show pulmonary artery hypertension.  Last time I seen him it was after back surgery he was very satisfied and happy with the surgery still doing very well.  Denies have any chest pain tightness squeezing pressure burning chest, does have some swelling of lower extremities some but otherwise doing well  Past Medical History:  Diagnosis Date   Abnormal gait    Acute blood loss anemia    Acute renal failure superimposed on stage 4 chronic kidney disease ([place]) 06/04/2017   Acute respiratory failure with hypoxia ([place]) 01/04/2019   Covid pneumonia Dec, 2020   Acute right ankle pain 12/08/2016   AKI (acute kidney injury) ([place]) 12/27/2018   Anemia of chronic disease    Arteriovenous fistula for hemodialysis in place, primary [facility]) 08/08/2017   Overview:  Left upper extremity  Formatting of this note might be different from the original. Left upper extremity   Arthritis    back, hands    Ascending aortic aneurysm ([place]) 10/23/2017   3.9 cm by echocardiogram   Backache 01/27/2011   BK viremia 02/07/2014   Body mass index (BMI) 36.0-36.9, adult 01/16/2020   Cerebral infarction ([place]) 08/08/2017   Cerebrovascular accident (CVA) ([place])    Cervical spondylosis 12/27/2012   Chronic back pain    radiculopathy and stenosis   Chronic ischemic heart disease    Severe LVH   Chronic kidney disease 2015   transplant   Congestive heart failure  (CHF) ([place])    COVID-19 virus infection    CVA (cerebral infarction) 03/2013   Diabetes mellitus     Lantus nightly ;type 2   Diabetes mellitus ([place]) 01/23/2011   Formatting of this note might be different from the original. Currently on Insulin   Digital mucinous cyst of finger of left hand 10/10/2016   Duodenal ulcer hemorrhagic    Dysphagia 08/22/2017   Dyspnea    Dyspnea on exertion 08/24/2016   PFT 12/28/16-mild restriction, mild reduction of diffusion.  No response to bronchodilator.  FVC 3.28/70%, FEV1 2.64/75%, ratio 0.81, TLC 77%, DLCO 66%.   Elevated blood-pressure reading, without diagnosis of hypertension 02/25/2019   Elevated LFTs    Elevated serum creatinine 10/24/2013   Essential (primary) hypertension 11/27/2018   takes Amlodipine and Catapres daily    dr Forrestine Him, Loop Recorder - [facility]er   Finger infection, fungal 11/22/2016   Fungal intestinal infection following organ transplantation 11/29/2016   GERD (gastroesophageal reflux disease)    GI bleed    5 units oblood    H/O hiatal hernia    Heart murmur    History of blood transfusion    History of colon polyps    History of kidney stones    passed   History of kidney transplant 01/24/2011   S/P renal transplant in 2015 at [facility]- followed by Dr Moshe Cipro at [facility]   History of stroke 03/16/2013   HLD (  hyperlipidemia) 05/02/2013   HTN (hypertension) 01/27/2011   Hx of cardiovascular stress test    a.  Lexiscan Myoview (05/2013):  No ischemia, EF 53%; Normal Study   Hyperkalemia 01/23/2011   Hyperlipidemia    takes Lovastatin daily   Hypertension    takes Amlodipine and Catapres daily    dr Forrestine Him, Loop Recorder - James Allred   Hypertension associated with transplantation 12/03/2014   Hypomagnesemia 10/25/2013   Immunosuppression ([place]) 10/14/2013   Immunosuppressive management encounter following kidney transplant 10/21/2013   Insulin dependent type 2 diabetes mellitus ([place]) 01/28/2014   Overview:   Currently on Insulin   Lumbago with sciatica, right side 12/17/2019   Lumbar post-laminectomy syndrome 05/13/2015   Mass of soft tissue of left upper extremity 10/18/2016   Melena    Movement disorder 03/16/2013   Nocturia    Obesity    OSA (obstructive sleep apnea) 05/02/2013   Unattended Home Sleep Test 10/24/12-AHI 24/hour, desaturation to 81%.   Osteoarthritis (arthritis due to wear and tear of joints) 08/08/2017   Other chronic pain 12/17/2019   Pain at surgical site 10/01/2013   Peripheral edema    takes Lasix daily   Personal history of COVID-19    Pneumonia 2021   Pneumonia due to COVID-19 virus    Preop cardiovascular exam 03/22/2013   Pulmonary hypertension, unspecified ([place]) 01/23/2017   Scapholunate dissociation of left wrist 10/18/2016   Sleep apnea    cpap , study in their home, 09/2102- [employer]            Spinal stenosis 01/16/2020   Formatting of this note might be different from the original. Operated twice with sciatica with disectomy of lumbars   Stroke ([place]) 2015   speech, rt arm weakness- 04/07/20 - no residual    Swelling of lower extremity 06/30/2016   Upper GI bleed 01/20/2019   URI, acute 03/07/2014   Urinary frequency     Past Surgical History:  Procedure Laterality Date   AV FISTULA PLACEMENT Left 04/03/2013   Procedure: ARTERIOVENOUS (AV) FISTULA CREATION- LEFT RADIOCEPHALIC VS BRACHIOCEPHALIC;  Surgeon: Conrad Mobeetie, MD;  Location: [place];  Service: Vascular;  Laterality: Left;   AV FISTULA PLACEMENT Left 05/14/2013   Procedure: LEFT BRACHIOCEPHALIC ARTERIOVENOUS (AV) FISTULA CREATION;  Surgeon: Conrad Espy, MD;  Location: [place];  Service: Vascular;  Laterality: Left;   BACK SURGERY  12/2012   2003- 1st back surgery & then 2014- fusion   COLONOSCOPY     ESOPHAGOGASTRODUODENOSCOPY     ESOPHAGOGASTRODUODENOSCOPY (EGD) WITH PROPOFOL N/A 01/22/2019   Procedure: ESOPHAGOGASTRODUODENOSCOPY (EGD) WITH PROPOFOL;  Surgeon: [name], MD;  Location: [place];  Service:  Gastroenterology;  Laterality: N/A;   HEMORRHOID SURGERY     HEMOSTASIS CLIP PLACEMENT  01/22/2019   Procedure: HEMOSTASIS CLIP PLACEMENT;  Surgeon: [name], MD;  Location: [facility] ENDOSCOPY;  Service: Gastroenterology;;   KIDNEY TRANSPLANT  10/01/2013   left knee surgery     LOOP RECORDER IMPLANT  03/19/13   MDT LinQ implanted by Dr Rayann Heman for cryptogenic stroke   LOOP RECORDER IMPLANT N/A 03/19/2013   Procedure: LOOP RECORDER IMPLANT;  Surgeon: Coralyn Mark, MD;  Location: [place] CATH LAB;  Service: Cardiovascular;  Laterality: N/A;   LUMBAR FUSION  04/08/2020   Dr. Newman Pies   NEPHRECTOMY TRANSPLANTED ORGAN     NEUROPLASTY / TRANSPOSITION MEDIAN NERVE AT CARPAL TUNNEL BILATERAL     right leg surgery     pin in place  right wrist surgery     SCLEROTHERAPY  01/22/2019   Procedure: SCLEROTHERAPY;  Surgeon: [name], MD;  Location[facility] ENDOSCOPY;  Service: Gastroenterology;;   TEE WITHOUT CARDIOVERSION N/A 03/19/2013   Procedure: TRANSESOPHAGEAL ECHOCARDIOGRAM (TEE);  Surgeon: [name], MD;  Location: [facility] ENDOSCOPY;  Service: Cardiovascular;  Laterality: N/A;   THYROID SURGERY  10/13/2015   Performed at [facility] with surgical pathology revealed consistent with benign follicular nodule ([name] category II)    Current Medications: Current Meds  Medication Sig   amLODipine (NORVASC) 10 MG tablet Take 10 mg by mouth daily.   aspirin 81 MG EC tablet Take 81 mg by mouth daily. Swallow whole.   atorvastatin (LIPITOR) 40 MG tablet Take 40 mg by mouth at bedtime.   calcitRIOL (ROCALTROL) 0.25 MCG capsule Take 0.25 mcg by mouth every Monday, Tuesday, Wednesday, Thursday, and Friday.   cholecalciferol (VITAMIN D3) 25 MCG (1000 UNIT) tablet Take 1,000 Units by mouth daily.   febuxostat (ULORIC) 40 MG tablet Take 1 tablet by mouth daily.   Ferrous Sulfate (IRON) 325 (65 Fe) MG TABS Take 1 tablet by mouth daily.   furosemide (LASIX) 40 MG tablet Take 80 mg by mouth in the morning. 1 every  afternoon   gabapentin (NEURONTIN) 300 MG capsule Take 300 mg by mouth 3 (three) times daily.   gabapentin (NEURONTIN) 300 MG capsule Take 1 capsule by mouth 3 (three) times daily.   gabapentin (NEURONTIN) 300 MG capsule Take 300 mg by mouth 3 (three) times daily.   HYDROcodone-acetaminophen (NORCO) 10-325 MG tablet Take 1-2 tablets by mouth every 4 (four) hours as needed for moderate pain.   Insulin Glargine (BASAGLAR KWIKPEN) 100 UNIT/ML Inject 40 Units into the skin at bedtime.   insulin lispro (HUMALOG KWIKPEN) 100 UNIT/ML KwikPen Inject 5 Units into the skin See admin instructions. Sliding scale over 150 add 2 units Three times a day   labetalol (NORMODYNE) 100 MG tablet Take 100 mg by mouth 2 (two) times daily.   methimazole (TAPAZOLE) 5 MG tablet Take 2.5 mg by mouth daily.    Multiple Vitamin (MULTIVITAMIN WITH MINERALS) TABS tablet Take 1 tablet by mouth daily. Unknown strength   mycophenolate (MYFORTIC) 180 MG EC tablet Take 180 mg by mouth 2 (two) times daily.    ONETOUCH ULTRA test strip 1 each by Other route as needed for other (Glucose check).   pantoprazole (PROTONIX) 40 MG tablet Take 1 tablet (40 mg total) by mouth 2 (two) times daily before a meal.   predniSONE (DELTASONE) 5 MG tablet Take 1 tablet (5 mg total) by mouth daily with breakfast.   St Johns Wort 300 MG CAPS Take 300 mg by mouth 3 (three) times daily.   sulfamethoxazole-trimethoprim (BACTRIM) 400-80 MG tablet Take 1 tablet by mouth every Monday, Wednesday, and Friday.   tacrolimus (PROGRAF) 1 MG capsule Take 2 mg by mouth 2 (two) times daily.   tamsulosin (FLOMAX) 0.4 MG CAPS capsule Take 0.4 mg by mouth at bedtime.   TRADJENTA 5 MG TABS tablet Take 5 mg by mouth daily.   vitamin B-12 (CYANOCOBALAMIN) 1000 MCG tablet Take 1,000 mcg by mouth 2 (two) times daily.    vitamin C (ASCORBIC ACID) 500 MG tablet Take 500 mg by mouth daily.   Zinc 30 MG CAPS Take 30 mg by mouth 2 (two) times daily.     Allergies:    Lisinopril, Meloxicam, Victoza [liraglutide], and Cyclobenzaprine   Social History   Socioeconomic History  Marital status: Married    Spouse name: agnes   Number of children: 5   Years of education: 8th   Highest education level: Not on file  Occupational History   Occupation: disabled  Tobacco Use   Smoking status: Former    Packs/day: 0.50    Years: 40.00    Total pack years: 20.00    Types: Cigarettes    Quit date: 12/27/2012    Years since quitting: 8.6   Smokeless tobacco: Never  Vaping Use   Vaping Use: Never used  Substance and Sexual Activity   Alcohol use: No    Alcohol/week: 0.0 standard drinks of alcohol   Drug use: No   Sexual activity: Yes  Other Topics Concern   Not on file  Social History Narrative   Patient lives at home with his wife Herbert Pun).  One story home with basement.   Patient is disabled.   Education 8th grade.   Right handed.   Caffeine One cup of coffee daily.   Retired Administrator.   5 children.   Social Determinants of Health   Financial Resource Strain: Not on file  Food Insecurity: No Food Insecurity (01/28/2019)   Hunger Vital Sign    Worried About Running Out of Food in the Last Year: Never true    Ran Out of Food in the Last Year: Never true  Transportation Needs: No Transportation Needs (01/28/2019)   PRAPARE - Hydrologist (Medical): No    Lack of Transportation (Non-Medical): No  Physical Activity: Not on file  Stress: Not on file  Social Connections: Not on file     Family History: The patient's family history includes Diabetes in his brother; Heart disease in his father; Hyperlipidemia in his father and son; Hypertension in his father and mother; Renal Disease in his father. ROS:   Please see the history of present illness.    All 14 point review of systems negative except as described per history of present illness  EKGs/Labs/Other Studies Reviewed:      Recent Labs: 04/12/2021: ALT  43; BUN 36; Creatinine 2.1; Hemoglobin 12.2; Platelets 148; Potassium 4.8; Sodium 141  Recent Lipid Panel    Component Value Date/Time   CHOL 137 01/02/2017 0343   TRIG 113 01/04/2019 0011   HDL 38 (L) 01/02/2017 0343   CHOLHDL 3.6 01/02/2017 0343   VLDL 16 01/02/2017 0343   LDLCALC 83 01/02/2017 0343    Physical Exam:    VS:  BP 120/62 (BP Location: Right Arm, Patient Position: Sitting, Cuff Size: Normal)   Pulse 66   Ht '5\' 11"'$  (1.803 m)   Wt 257 lb (116.6 kg)   SpO2 91%   BMI 35.84 kg/m     Wt Readings from Last 3 Encounters:  08/25/21 257 lb (116.6 kg)  04/12/21 263 lb 4.8 oz (119.4 kg)  01/25/21 253 lb (114.8 kg)     GEN:  Well nourished, well developed in no acute distress HEENT: Normal NECK: No JVD; No carotid bruits LYMPHATICS: No lymphadenopathy CARDIAC: RRR, systolic ejection murmur grade 2/6 best heard right upper portion of sternum, no rubs, no gallops RESPIRATORY:  Clear to auscultation without rales, wheezing or rhonchi  ABDOMEN: Soft, non-tender, non-distended MUSCULOSKELETAL:  No edema; No deformity  SKIN: Warm and dry LOWER EXTREMITIES: no swelling NEUROLOGIC:  Alert and oriented x 3 PSYCHIATRIC:  Normal affect   ASSESSMENT:    1. Aneurysm of ascending aorta without rupture (HCC)  2. Hypertension associated with transplantation   3. Pulmonary hypertension, unspecified ([name])   4. Chronic diastolic congestive heart failure ([place])    PLAN:    In order of problems listed above:  Aneurysm of the ascending artery, echocardiogram will be done to check the size of the aneurysm. Essential hypertension blood pressure well controlled continue present management. Pulmonary hypertension, however, last echocardiogram did not confirm that.  Echocardiogram will be repeated Chronic diastolic congestive heart failure compensated Dyslipidemia I did review K PN which show me LDL of 70 HDL 39.  Good cholesterol control we will continue present management which  include Lipitor 40 Systolic ejection murmur, echocardiogram will be done   Medication Adjustments/Labs and Tests Ordered: Current medicines are reviewed at length with the patient today.  Concerns regarding medicines are outlined above.  No orders of the defined types were placed in this encounter.  Medication changes: No orders of the defined types were placed in this encounter.   Signed, Park Liter, MD, [facility] 08/25/2021 10:41 AM    [place]

## 2021-09-02 ENCOUNTER — Other Ambulatory Visit: Payer: Medicare Other

## 2021-09-06 DIAGNOSIS — R2689 Other abnormalities of gait and mobility: Secondary | ICD-10-CM | POA: Diagnosis not present

## 2021-09-06 DIAGNOSIS — M6281 Muscle weakness (generalized): Secondary | ICD-10-CM | POA: Diagnosis not present

## 2021-09-09 DIAGNOSIS — E1165 Type 2 diabetes mellitus with hyperglycemia: Secondary | ICD-10-CM | POA: Diagnosis not present

## 2021-09-09 DIAGNOSIS — I10 Essential (primary) hypertension: Secondary | ICD-10-CM | POA: Diagnosis not present

## 2021-09-09 DIAGNOSIS — E7800 Pure hypercholesterolemia, unspecified: Secondary | ICD-10-CM | POA: Diagnosis not present

## 2021-09-15 ENCOUNTER — Other Ambulatory Visit: Payer: Medicare Other

## 2021-09-20 ENCOUNTER — Ambulatory Visit: Payer: Medicare Other | Attending: Cardiology

## 2021-09-20 DIAGNOSIS — I5032 Chronic diastolic (congestive) heart failure: Secondary | ICD-10-CM

## 2021-09-20 DIAGNOSIS — I7121 Aneurysm of the ascending aorta, without rupture: Secondary | ICD-10-CM

## 2021-09-20 DIAGNOSIS — I272 Pulmonary hypertension, unspecified: Secondary | ICD-10-CM | POA: Diagnosis not present

## 2021-09-20 DIAGNOSIS — I158 Other secondary hypertension: Secondary | ICD-10-CM | POA: Diagnosis not present

## 2021-09-20 DIAGNOSIS — Z949 Transplanted organ and tissue status, unspecified: Secondary | ICD-10-CM | POA: Diagnosis not present

## 2021-09-20 LAB — ECHOCARDIOGRAM COMPLETE
AR max vel: 2.23 cm2
AV Area VTI: 2.09 cm2
AV Area mean vel: 2.18 cm2
AV Mean grad: 17.5 mmHg
AV Peak grad: 27.1 mmHg
Ao pk vel: 2.61 m/s
Area-P 1/2: 2.77 cm2
S' Lateral: 3.2 cm

## 2021-09-21 DIAGNOSIS — Z794 Long term (current) use of insulin: Secondary | ICD-10-CM | POA: Diagnosis not present

## 2021-09-21 DIAGNOSIS — E1129 Type 2 diabetes mellitus with other diabetic kidney complication: Secondary | ICD-10-CM | POA: Diagnosis not present

## 2021-09-23 ENCOUNTER — Telehealth: Payer: Self-pay

## 2021-09-23 NOTE — Telephone Encounter (Signed)
Results reviewed with pt as per Dr. Krasowski's note.  Pt verbalized understanding and had no additional questions. Routed to PCP  

## 2021-10-05 DIAGNOSIS — M25551 Pain in right hip: Secondary | ICD-10-CM | POA: Diagnosis not present

## 2021-10-05 DIAGNOSIS — M48062 Spinal stenosis, lumbar region with neurogenic claudication: Secondary | ICD-10-CM | POA: Diagnosis not present

## 2021-10-07 DIAGNOSIS — Z9989 Dependence on other enabling machines and devices: Secondary | ICD-10-CM | POA: Diagnosis not present

## 2021-10-07 DIAGNOSIS — Z792 Long term (current) use of antibiotics: Secondary | ICD-10-CM | POA: Diagnosis not present

## 2021-10-07 DIAGNOSIS — E119 Type 2 diabetes mellitus without complications: Secondary | ICD-10-CM | POA: Diagnosis not present

## 2021-10-07 DIAGNOSIS — Z7984 Long term (current) use of oral hypoglycemic drugs: Secondary | ICD-10-CM | POA: Diagnosis not present

## 2021-10-07 DIAGNOSIS — Z8616 Personal history of COVID-19: Secondary | ICD-10-CM | POA: Diagnosis not present

## 2021-10-07 DIAGNOSIS — Z7952 Long term (current) use of systemic steroids: Secondary | ICD-10-CM | POA: Diagnosis not present

## 2021-10-07 DIAGNOSIS — B9789 Other viral agents as the cause of diseases classified elsewhere: Secondary | ICD-10-CM | POA: Diagnosis not present

## 2021-10-07 DIAGNOSIS — Z79621 Long term (current) use of calcineurin inhibitor: Secondary | ICD-10-CM | POA: Diagnosis not present

## 2021-10-07 DIAGNOSIS — Z4822 Encounter for aftercare following kidney transplant: Secondary | ICD-10-CM | POA: Diagnosis not present

## 2021-10-07 DIAGNOSIS — G4733 Obstructive sleep apnea (adult) (pediatric): Secondary | ICD-10-CM | POA: Diagnosis not present

## 2021-10-07 DIAGNOSIS — D8989 Other specified disorders involving the immune mechanism, not elsewhere classified: Secondary | ICD-10-CM | POA: Diagnosis not present

## 2021-10-07 DIAGNOSIS — Z8619 Personal history of other infectious and parasitic diseases: Secondary | ICD-10-CM | POA: Diagnosis not present

## 2021-10-07 DIAGNOSIS — I10 Essential (primary) hypertension: Secondary | ICD-10-CM | POA: Diagnosis not present

## 2021-10-07 DIAGNOSIS — Z79899 Other long term (current) drug therapy: Secondary | ICD-10-CM | POA: Diagnosis not present

## 2021-10-07 DIAGNOSIS — Z79624 Long term (current) use of inhibitors of nucleotide synthesis: Secondary | ICD-10-CM | POA: Diagnosis not present

## 2021-10-07 DIAGNOSIS — Z940 Kidney transplant status: Secondary | ICD-10-CM | POA: Diagnosis not present

## 2021-10-07 DIAGNOSIS — Z794 Long term (current) use of insulin: Secondary | ICD-10-CM | POA: Diagnosis not present

## 2021-10-11 NOTE — Progress Notes (Signed)
[facility]  [address] [place],  [place]  [address] [phone]  Clinic Day:  10/12/2021  Referring physician: [name], MD  HISTORY OF PRESENT ILLNESS:  The patient is a 71 y.o. male with anemia secondary to iron deficiency and chronic renal insufficiency.  In the past, IV iron was effective in replenishing his iron stores and improving his hemoglobin.  He comes in today to reassess his hemoglobin.  Since his last visit, the patient has been doing fine.  He denies having increased fatigue or any overt forms of blood loss which concern him for progressive anemia.  PHYSICAL EXAM:  Blood pressure 126/60, pulse 69, temperature 98.3 F (36.8 C), resp. rate 16, height '5\' 11"'$  (1.803 m), weight 250 lb (113.4 kg), SpO2 93 %. Wt Readings from Last 3 Encounters:  10/12/21 250 lb (113.4 kg)  08/25/21 257 lb (116.6 kg)  04/12/21 263 lb 4.8 oz (119.4 kg)   Body mass index is 34.87 kg/m. Performance status (ECOG): 1 - Symptomatic but completely ambulatory Physical Exam Constitutional:      General: He is not in acute distress.    Appearance: Normal appearance. He is normal weight.  HENT:     Head: Normocephalic and atraumatic.  Eyes:     General: No scleral icterus.    Extraocular Movements: Extraocular movements intact.     Conjunctiva/sclera: Conjunctivae normal.     Pupils: Pupils are equal, round, and reactive to light.  Cardiovascular:     Rate and Rhythm: Normal rate and regular rhythm.     Pulses: Normal pulses.     Heart sounds: Normal heart sounds. No murmur heard.    No friction rub. No gallop.  Pulmonary:     Effort: Pulmonary effort is normal. No respiratory distress.     Breath sounds: Normal breath sounds.  Abdominal:     General: Bowel sounds are normal. There is no distension.     Palpations: Abdomen is soft. There is no hepatomegaly, splenomegaly or mass.     Tenderness: There is no abdominal tenderness.   Musculoskeletal:        General: Normal range of motion.     Cervical back: Normal range of motion and neck supple.     Right lower leg: No edema.     Left lower leg: No edema.  Lymphadenopathy:     Cervical: No cervical adenopathy.  Skin:    General: Skin is warm and dry.  Neurological:     General: No focal deficit present.     Mental Status: He is alert and oriented to person, place, and time. Mental status is at baseline.  Psychiatric:        Mood and Affect: Mood normal.        Behavior: Behavior normal.        Thought Content: Thought content normal.        Judgment: Judgment normal.     LABS:      Latest Ref Rng & Units 10/12/2021   12:00 AM 04/12/2021   12:00 AM 12/10/2020   12:00 AM  CBC  WBC  8.6     9.3     11.6   Hemoglobin 13.5 - 17.5 12.6     12.2     12.4   Hematocrit 41 - 53 37     37     38   Platelets 150 - 400 K/uL 135     148  133      This result is from an external source.      Latest Ref Rng & Units 10/12/2021   12:00 AM 04/12/2021   12:00 AM 12/10/2020   12:00 AM  CMP  BUN 4 - 21 53     36     44   Creatinine 0.6 - 1.3 2.5     2.1     2.5   Sodium 137 - 147 143     141     142   Potassium 3.5 - 5.1 mEq/L 4.5     4.8     4.5   Chloride 99 - 108 110     107     107   CO2 13 - '22 22     26     20   '$ Calcium 8.7 - 10.7 9.1     8.7     8.7   Alkaline Phos 25 - 125 112     118     109   AST 14 - 40 32     32     35   ALT 10 - 40 U/L 45     43     35      This result is from an external source.    Latest Reference Range & Units 10/12/21 09:14  Iron 45 - 182 ug/dL 77  UIBC ug/dL 236  TIBC 250 - 450 ug/dL 313  Saturation Ratios 17.9 - 39.5 % 25  Ferritin 24 - 336 ng/mL 129   ASSESSMENT & PLAN:  Assessment/Plan:  A 71 y.o. male with anemia secondary to iron deficiency and chronic renal insufficiency.  I am pleased that his hemoglobin remains well above 12.  Furthermore, his iron parameters remain normal.  As it pertains to his transplanted  kidney, he knows it remains imperative to keep his hypertension and diabetes under tight control in order to prevent its function from declining over time, which could also exacerbate his anemia.  Clinically, he appears to be doing well.  As it is the case, I do feel comfortable seeing him back in 1 year for repeat clinical assessment.  The patient understands all the plans discussed today and is in agreement with them.    Colleen Donahoe Macarthur Critchley, MD

## 2021-10-12 ENCOUNTER — Other Ambulatory Visit: Payer: Self-pay | Admitting: Oncology

## 2021-10-12 ENCOUNTER — Inpatient Hospital Stay: Payer: Medicare Other | Attending: Oncology | Admitting: Oncology

## 2021-10-12 ENCOUNTER — Inpatient Hospital Stay: Payer: Medicare Other

## 2021-10-12 ENCOUNTER — Telehealth: Payer: Self-pay | Admitting: Oncology

## 2021-10-12 VITALS — BP 126/60 | HR 69 | Temp 98.3°F | Resp 16 | Ht 71.0 in | Wt 250.0 lb

## 2021-10-12 DIAGNOSIS — D649 Anemia, unspecified: Secondary | ICD-10-CM | POA: Diagnosis not present

## 2021-10-12 DIAGNOSIS — D509 Iron deficiency anemia, unspecified: Secondary | ICD-10-CM | POA: Diagnosis not present

## 2021-10-12 DIAGNOSIS — D631 Anemia in chronic kidney disease: Secondary | ICD-10-CM

## 2021-10-12 DIAGNOSIS — N189 Chronic kidney disease, unspecified: Secondary | ICD-10-CM | POA: Diagnosis not present

## 2021-10-12 LAB — BASIC METABOLIC PANEL
BUN: 53 — AB (ref 4–21)
CO2: 22 (ref 13–22)
Chloride: 110 — AB (ref 99–108)
Creatinine: 2.5 — AB (ref 0.6–1.3)
Glucose: 204
Potassium: 4.5 mEq/L (ref 3.5–5.1)
Sodium: 143 (ref 137–147)

## 2021-10-12 LAB — CBC AND DIFFERENTIAL
HCT: 37 — AB (ref 41–53)
Hemoglobin: 12.6 — AB (ref 13.5–17.5)
Neutrophils Absolute: 6.97
Platelets: 135 10*3/uL — AB (ref 150–400)
WBC: 8.6

## 2021-10-12 LAB — COMPREHENSIVE METABOLIC PANEL
Albumin: 4.1 (ref 3.5–5.0)
Calcium: 9.1 (ref 8.7–10.7)

## 2021-10-12 LAB — IRON AND TIBC
Iron: 77 ug/dL (ref 45–182)
Saturation Ratios: 25 % (ref 17.9–39.5)
TIBC: 313 ug/dL (ref 250–450)
UIBC: 236 ug/dL

## 2021-10-12 LAB — CBC: RBC: 4.07 (ref 3.87–5.11)

## 2021-10-12 LAB — HEPATIC FUNCTION PANEL
ALT: 45 U/L — AB (ref 10–40)
AST: 32 (ref 14–40)
Alkaline Phosphatase: 112 (ref 25–125)
Bilirubin, Total: 0.5

## 2021-10-12 LAB — FERRITIN: Ferritin: 129 ng/mL (ref 24–336)

## 2021-10-12 NOTE — Telephone Encounter (Signed)
10/12/21 Next appt scheduled and confirmed with patient

## 2021-11-01 DIAGNOSIS — G4733 Obstructive sleep apnea (adult) (pediatric): Secondary | ICD-10-CM | POA: Diagnosis not present

## 2021-11-04 DIAGNOSIS — L578 Other skin changes due to chronic exposure to nonionizing radiation: Secondary | ICD-10-CM | POA: Diagnosis not present

## 2021-11-04 DIAGNOSIS — I10 Essential (primary) hypertension: Secondary | ICD-10-CM | POA: Diagnosis not present

## 2021-11-04 DIAGNOSIS — R809 Proteinuria, unspecified: Secondary | ICD-10-CM | POA: Diagnosis not present

## 2021-11-04 DIAGNOSIS — L821 Other seborrheic keratosis: Secondary | ICD-10-CM | POA: Diagnosis not present

## 2021-11-04 DIAGNOSIS — Z940 Kidney transplant status: Secondary | ICD-10-CM | POA: Diagnosis not present

## 2021-11-04 DIAGNOSIS — N2581 Secondary hyperparathyroidism of renal origin: Secondary | ICD-10-CM | POA: Diagnosis not present

## 2021-11-04 DIAGNOSIS — R634 Abnormal weight loss: Secondary | ICD-10-CM | POA: Diagnosis not present

## 2021-11-04 DIAGNOSIS — N189 Chronic kidney disease, unspecified: Secondary | ICD-10-CM | POA: Diagnosis not present

## 2021-11-04 DIAGNOSIS — L570 Actinic keratosis: Secondary | ICD-10-CM | POA: Diagnosis not present

## 2021-11-04 DIAGNOSIS — C44612 Basal cell carcinoma of skin of right upper limb, including shoulder: Secondary | ICD-10-CM | POA: Diagnosis not present

## 2021-11-04 DIAGNOSIS — B348 Other viral infections of unspecified site: Secondary | ICD-10-CM | POA: Diagnosis not present

## 2021-11-04 DIAGNOSIS — K922 Gastrointestinal hemorrhage, unspecified: Secondary | ICD-10-CM | POA: Diagnosis not present

## 2021-11-04 DIAGNOSIS — M109 Gout, unspecified: Secondary | ICD-10-CM | POA: Diagnosis not present

## 2021-11-04 DIAGNOSIS — E1129 Type 2 diabetes mellitus with other diabetic kidney complication: Secondary | ICD-10-CM | POA: Diagnosis not present

## 2021-11-04 DIAGNOSIS — D631 Anemia in chronic kidney disease: Secondary | ICD-10-CM | POA: Diagnosis not present

## 2021-11-21 DIAGNOSIS — R059 Cough, unspecified: Secondary | ICD-10-CM | POA: Diagnosis not present

## 2021-11-21 DIAGNOSIS — R339 Retention of urine, unspecified: Secondary | ICD-10-CM | POA: Diagnosis not present

## 2021-11-21 DIAGNOSIS — J209 Acute bronchitis, unspecified: Secondary | ICD-10-CM | POA: Diagnosis not present

## 2021-11-27 DIAGNOSIS — R5383 Other fatigue: Secondary | ICD-10-CM | POA: Diagnosis not present

## 2021-11-27 DIAGNOSIS — R5381 Other malaise: Secondary | ICD-10-CM | POA: Diagnosis not present

## 2021-11-27 DIAGNOSIS — R058 Other specified cough: Secondary | ICD-10-CM | POA: Diagnosis not present

## 2021-11-27 DIAGNOSIS — R531 Weakness: Secondary | ICD-10-CM | POA: Diagnosis not present

## 2021-11-30 DIAGNOSIS — J329 Chronic sinusitis, unspecified: Secondary | ICD-10-CM | POA: Diagnosis not present

## 2021-11-30 DIAGNOSIS — R269 Unspecified abnormalities of gait and mobility: Secondary | ICD-10-CM | POA: Diagnosis not present

## 2021-11-30 DIAGNOSIS — J40 Bronchitis, not specified as acute or chronic: Secondary | ICD-10-CM | POA: Diagnosis not present

## 2021-11-30 DIAGNOSIS — Z87891 Personal history of nicotine dependence: Secondary | ICD-10-CM | POA: Diagnosis not present

## 2021-12-06 DIAGNOSIS — N184 Chronic kidney disease, stage 4 (severe): Secondary | ICD-10-CM | POA: Diagnosis not present

## 2021-12-23 DIAGNOSIS — Z794 Long term (current) use of insulin: Secondary | ICD-10-CM | POA: Diagnosis not present

## 2021-12-23 DIAGNOSIS — E1129 Type 2 diabetes mellitus with other diabetic kidney complication: Secondary | ICD-10-CM | POA: Diagnosis not present

## 2021-12-24 DIAGNOSIS — M48062 Spinal stenosis, lumbar region with neurogenic claudication: Secondary | ICD-10-CM | POA: Diagnosis not present

## 2021-12-24 DIAGNOSIS — M25551 Pain in right hip: Secondary | ICD-10-CM | POA: Diagnosis not present

## 2021-12-28 DIAGNOSIS — E1165 Type 2 diabetes mellitus with hyperglycemia: Secondary | ICD-10-CM | POA: Diagnosis not present

## 2021-12-28 DIAGNOSIS — Z794 Long term (current) use of insulin: Secondary | ICD-10-CM | POA: Diagnosis not present

## 2021-12-31 DIAGNOSIS — M5136 Other intervertebral disc degeneration, lumbar region: Secondary | ICD-10-CM | POA: Diagnosis not present

## 2022-01-09 DIAGNOSIS — E1165 Type 2 diabetes mellitus with hyperglycemia: Secondary | ICD-10-CM | POA: Diagnosis not present

## 2022-01-09 DIAGNOSIS — E7800 Pure hypercholesterolemia, unspecified: Secondary | ICD-10-CM | POA: Diagnosis not present

## 2022-01-09 DIAGNOSIS — I10 Essential (primary) hypertension: Secondary | ICD-10-CM | POA: Diagnosis not present

## 2022-01-22 NOTE — Progress Notes (Unsigned)
HPI  male former smoker followed for OSA, complicated by ESRD/Transplant, DM 2, GERD, CVA, chronic back pain Unattended Home Sleep Test 10/24/12-AHI 24/hour, desaturation to 81%. PFT 12/28/16-mild restriction, mild reduction of diffusion.  No response to bronchodilator.  FVC 3.28/70%, FEV1 2.64/75%, ratio 0.81, TLC 77%, DLCO 66%. -------------------------------------------------------------------------------   01/25/21- 72 year old male former smoker(20 pkyrs) followed for OSA, DOE complicated by ESRD/transplant, DM 2, GERD, CVA, chronic back pain, HTN, Aortic Aneurysm,  Pulmonary Hypertension, Covid pneumonia 2020, Anemia,  f/u OSA CPAP auto 5-20/ [facility]PS      AirSense 10 Auto Download- compliance 100%, AHI 1.3/ hr Body weight today- 253 lbs Covid vax- none Flu vax-had He and wife both state he is doing very well with CPAP, with no concerns expressed.  Download reviewed.  Good compliance and control.  Machine now 71 years old. Transplanted kidney still doing well. (Wife canned 900 jars this year)  01/25/22- 72 year old male former smoker(20 pkyrs) followed for OSA, DOE complicated by ESRD/transplant, HTN, DM 2, GERD, CVA, chronic back pain, HTN, Aortic Aneurysm,  Pulmonary Hypertension, Covid pneumonia 2020, Anemia, CHF,  f/u OSA CPAP auto 5-20/ [facility]      AirSense 10 Auto Download- compliance 100%, AHI 3/ hr Body weight today- 251 lbs Covid vax- none Flu vax-had -----Pt doing well. No issues with cpap machine                            Wife here He is comfortable with CPAP. Download reviewed. Mask occasionally loses seal but not a problem and he still has full beard. Says transplanted kidney still doing well. Had bronchitis at Thanksgiving, resolved.  ROS-see HPI   + = positive Constitutional:    weight loss, night sweats, fevers, chills, fatigue, lassitude. HEENT:    headaches, difficulty swallowing, tooth/dental problems, sore throat,       sneezing, itching, ear ache, nasal congestion,  post nasal drip, snoring CV:    chest pain, orthopnea, PND, swelling in lower extremities, anasarca,                                                dizziness, palpitations Resp:   shortness of breath with exertion or at rest.                productive cough,   non-productive cough, coughing up of blood.              change in color of mucus.  wheezing.   Skin:    rash or lesions. GI:  No-   heartburn, indigestion, abdominal pain, nausea, vomiting, diarrhea,                 change in bowel habits, loss of appetite GU: dysuria, change in color of urine, no urgency or frequency.   flank pain. MS:   joint pain, stiffness, decreased range of motion, back pain. Neuro-     nothing unusual Psych:  change in mood or affect.  depression or anxiety.   memory loss.  OBJ- Physical Exam General- Alert, Oriented, Affect-appropriate, Distress- none acute, +overweight, +full beard Skin-  Lymphadenopathy- none Head- atraumatic            Eyes- Gross vision intact, PERRLA, conjunctivae and secretions clear            Ears- Hearing,  canals-normal            Nose- Clear, no-Septal dev, mucus, polyps, erosion, perforation             Throat- Mallampati IV , mucosa clear , drainage- none, tonsils- atrophic, + full dentures Neck- flexible , trachea midline, no stridor , thyroid nl, carotid no bruit Chest - symmetrical excursion , unlabored           Heart/CV- RRR , murmur+ 1/6 S Ao , no gallop  , no rub, nl s1 s2                           - JVD- none , edema- none, stasis changes- none, varices- none           Lung- clear to P&A, wheeze- none, cough- none , dullness-none, rub- none           Chest wall- + loop recorder Abd-  Br/ Gen/ Rectal- Not done, not indicated Extrem- cyanosis- none, clubbing, none, atrophy- none, strength- nl Neuro- grossly intact to observation

## 2022-01-25 ENCOUNTER — Encounter: Payer: Self-pay | Admitting: Internal Medicine

## 2022-01-25 ENCOUNTER — Ambulatory Visit (INDEPENDENT_AMBULATORY_CARE_PROVIDER_SITE_OTHER): Payer: Medicare Other | Admitting: Internal Medicine

## 2022-01-25 VITALS — BP 146/70 | HR 92 | Ht 71.0 in | Wt 251.2 lb

## 2022-01-25 DIAGNOSIS — I158 Other secondary hypertension: Secondary | ICD-10-CM

## 2022-01-25 DIAGNOSIS — Z949 Transplanted organ and tissue status, unspecified: Secondary | ICD-10-CM | POA: Diagnosis not present

## 2022-01-25 DIAGNOSIS — G4733 Obstructive sleep apnea (adult) (pediatric): Secondary | ICD-10-CM

## 2022-01-25 NOTE — Assessment & Plan Note (Signed)
Benefits from CPAP with good compliance and control. Plan- continue auto 5-20

## 2022-01-25 NOTE — Assessment & Plan Note (Signed)
Continues appropriate follow-up.

## 2022-01-25 NOTE — Patient Instructions (Signed)
We can continue CPAP auto 5-20  Please call if we can help 

## 2022-01-26 DIAGNOSIS — Z940 Kidney transplant status: Secondary | ICD-10-CM | POA: Diagnosis not present

## 2022-01-31 DIAGNOSIS — N189 Chronic kidney disease, unspecified: Secondary | ICD-10-CM | POA: Diagnosis not present

## 2022-01-31 DIAGNOSIS — I10 Essential (primary) hypertension: Secondary | ICD-10-CM | POA: Diagnosis not present

## 2022-01-31 DIAGNOSIS — Z940 Kidney transplant status: Secondary | ICD-10-CM | POA: Diagnosis not present

## 2022-01-31 DIAGNOSIS — R634 Abnormal weight loss: Secondary | ICD-10-CM | POA: Diagnosis not present

## 2022-01-31 DIAGNOSIS — M109 Gout, unspecified: Secondary | ICD-10-CM | POA: Diagnosis not present

## 2022-01-31 DIAGNOSIS — K922 Gastrointestinal hemorrhage, unspecified: Secondary | ICD-10-CM | POA: Diagnosis not present

## 2022-01-31 DIAGNOSIS — B348 Other viral infections of unspecified site: Secondary | ICD-10-CM | POA: Diagnosis not present

## 2022-01-31 DIAGNOSIS — R809 Proteinuria, unspecified: Secondary | ICD-10-CM | POA: Diagnosis not present

## 2022-01-31 DIAGNOSIS — E1129 Type 2 diabetes mellitus with other diabetic kidney complication: Secondary | ICD-10-CM | POA: Diagnosis not present

## 2022-01-31 DIAGNOSIS — N2581 Secondary hyperparathyroidism of renal origin: Secondary | ICD-10-CM | POA: Diagnosis not present

## 2022-01-31 DIAGNOSIS — D631 Anemia in chronic kidney disease: Secondary | ICD-10-CM | POA: Diagnosis not present

## 2022-02-02 ENCOUNTER — Encounter: Payer: Self-pay | Admitting: Internal Medicine

## 2022-02-18 DIAGNOSIS — E7800 Pure hypercholesterolemia, unspecified: Secondary | ICD-10-CM | POA: Diagnosis not present

## 2022-02-18 DIAGNOSIS — E1129 Type 2 diabetes mellitus with other diabetic kidney complication: Secondary | ICD-10-CM | POA: Diagnosis not present

## 2022-02-18 DIAGNOSIS — Z794 Long term (current) use of insulin: Secondary | ICD-10-CM | POA: Diagnosis not present

## 2022-02-18 DIAGNOSIS — I10 Essential (primary) hypertension: Secondary | ICD-10-CM | POA: Diagnosis not present

## 2022-02-18 DIAGNOSIS — E1165 Type 2 diabetes mellitus with hyperglycemia: Secondary | ICD-10-CM | POA: Diagnosis not present

## 2022-02-25 ENCOUNTER — Encounter: Payer: Self-pay | Admitting: Cardiology

## 2022-02-25 ENCOUNTER — Ambulatory Visit: Payer: Medicare Other | Attending: Cardiology | Admitting: Cardiology

## 2022-02-25 VITALS — BP 142/66 | HR 72 | Ht 71.0 in | Wt 255.0 lb

## 2022-02-25 DIAGNOSIS — I272 Pulmonary hypertension, unspecified: Secondary | ICD-10-CM | POA: Diagnosis not present

## 2022-02-25 DIAGNOSIS — I7121 Aneurysm of the ascending aorta, without rupture: Secondary | ICD-10-CM | POA: Diagnosis not present

## 2022-02-25 DIAGNOSIS — I259 Chronic ischemic heart disease, unspecified: Secondary | ICD-10-CM | POA: Diagnosis not present

## 2022-02-25 DIAGNOSIS — I10 Essential (primary) hypertension: Secondary | ICD-10-CM

## 2022-02-25 DIAGNOSIS — G4733 Obstructive sleep apnea (adult) (pediatric): Secondary | ICD-10-CM

## 2022-02-25 NOTE — Progress Notes (Signed)
Cardiology Office Note:    Date:  02/25/2022   ID:  Jon [name], DOB 1950-03-02, MRN [id number]  PCP:  [name], MD  Cardiologist:  [name], MD    Referring MD: [name], MD   No chief complaint on file.   History of Present Illness:    Jon [name] is a 72 y.o. male with past medical history significant for kidney dysfunction, 7 years ago he received a kidney transplant donor was his sister, ascending aortic aneurysm measuring 40 mm, essential hypertension, diabetes, pulmonary hypertension which is only mildly elevated.  Comes today to months for follow-up overall doing very well.  He denies of any chest pain tightness squeezing pressure burning chest.  He is still very happy with the surgery on the back he had done few months ago.  Past Medical History:  Diagnosis Date   Abnormal gait    Acute blood loss anemia    Acute renal failure superimposed on stage 4 chronic kidney disease ([place]) 06/04/2017   Acute respiratory failure with hypoxia ([place]) 01/04/2019   Covid pneumonia Dec, 2020   Acute right ankle pain 12/08/2016   AKI (acute kidney injury) ([place]) 12/27/2018   Anemia of chronic disease    Arteriovenous fistula for hemodialysis in place, primary [facility]) 08/08/2017   Overview:  Left upper extremity  Formatting of this note might be different from the original. Left upper extremity   Arthritis    back, hands    Ascending aortic aneurysm ([place]) 10/23/2017   3.9 cm by echocardiogram   Backache 01/27/2011   BK viremia 02/07/2014   Body mass index (BMI) 36.0-36.9, adult 01/16/2020   Cerebral infarction ([place]) 08/08/2017   Cerebrovascular accident (CVA) ([place])    Cervical spondylosis 12/27/2012   Chronic back pain    radiculopathy and stenosis   Chronic ischemic heart disease    Severe LVH   Chronic kidney disease 2015   transplant   Congestive heart failure (CHF) ([place])    COVID-19 virus infection    CVA (cerebral infarction) 03/2013    Diabetes mellitus     Lantus nightly ;type 2   Diabetes mellitus ([place]) 01/23/2011   Formatting of this note might be different from the original. Currently on Insulin   Digital mucinous cyst of finger of left hand 10/10/2016   Duodenal ulcer hemorrhagic    Dysphagia 08/22/2017   Dyspnea    Dyspnea on exertion 08/24/2016   PFT 12/28/16-mild restriction, mild reduction of diffusion.  No response to bronchodilator.  FVC 3.28/70%, FEV1 2.64/75%, ratio 0.81, TLC 77%, DLCO 66%.   Elevated blood-pressure reading, without diagnosis of hypertension 02/25/2019   Elevated LFTs    Elevated serum creatinine 10/24/2013   Essential (primary) hypertension 11/27/2018   takes Amlodipine and Catapres daily    dr Forrestine Him, Loop Recorder - [place]   Finger infection, fungal 11/22/2016   Fungal intestinal infection following organ transplantation 11/29/2016   GERD (gastroesophageal reflux disease)    GI bleed    5 units oblood    H/O hiatal hernia    Heart murmur    History of blood transfusion    History of colon polyps    History of kidney stones    passed   History of kidney transplant 01/24/2011   S/P renal transplant in 2015 at [facility]- followed by Dr Moshe Cipro at [facility]   History of stroke 03/16/2013   HLD (hyperlipidemia) 05/02/2013   HTN (hypertension) 01/27/2011   Hx  of cardiovascular stress test    a.  Lexiscan Myoview (05/2013):  No ischemia, EF 53%; Normal Study   Hyperkalemia 01/23/2011   Hyperlipidemia    takes Lovastatin daily   Hypertension    takes Amlodipine and Catapres daily    dr Forrestine Him, Loop Recorder - [facility]   Hypertension associated with transplantation 12/03/2014   Hypomagnesemia 10/25/2013   Immunosuppression ([place]) 10/14/2013   Immunosuppressive management encounter following kidney transplant 10/21/2013   Insulin dependent type 2 diabetes mellitus ([place]) 01/28/2014   Overview:  Currently on Insulin   Lumbago with sciatica, right side 12/17/2019   Lumbar  post-laminectomy syndrome 05/13/2015   Mass of soft tissue of left upper extremity 10/18/2016   Melena    Movement disorder 03/16/2013   Nocturia    Obesity    OSA (obstructive sleep apnea) 05/02/2013   Unattended Home Sleep Test 10/24/12-AHI 24/hour, desaturation to 81%.   Osteoarthritis (arthritis due to wear and tear of joints) 08/08/2017   Other chronic pain 12/17/2019   Pain at surgical site 10/01/2013   Peripheral edema    takes Lasix daily   Personal history of COVID-19    Pneumonia 2021   Pneumonia due to COVID-19 virus    Preop cardiovascular exam 03/22/2013   Pulmonary hypertension, unspecified ([place]) 01/23/2017   Scapholunate dissociation of left wrist 10/18/2016   Sleep apnea    cpap , study in their home, 09/2102- [employer]            Spinal stenosis 01/16/2020   Formatting of this note might be different from the original. Operated twice with sciatica with disectomy of lumbars   Stroke ([place]) 2015   speech, rt arm weakness- 04/07/20 - no residual    Swelling of lower extremity 06/30/2016   Upper GI bleed 01/20/2019   URI, acute 03/07/2014   Urinary frequency     Past Surgical History:  Procedure Laterality Date   AV FISTULA PLACEMENT Left 04/03/2013   Procedure: ARTERIOVENOUS (AV) FISTULA CREATION- LEFT RADIOCEPHALIC VS BRACHIOCEPHALIC;  Surgeon: Conrad Muscoy, MD;  Location: [place];  Service: Vascular;  Laterality: Left;   AV FISTULA PLACEMENT Left 05/14/2013   Procedure: LEFT BRACHIOCEPHALIC ARTERIOVENOUS (AV) FISTULA CREATION;  Surgeon: Conrad [place] Farms, MD;  Location: [place];  Service: Vascular;  Laterality: Left;   BACK SURGERY  12/2012   2003- 1st back surgery & then 2014- fusion   COLONOSCOPY     ESOPHAGOGASTRODUODENOSCOPY     ESOPHAGOGASTRODUODENOSCOPY (EGD) WITH PROPOFOL N/A 01/22/2019   Procedure: ESOPHAGOGASTRODUODENOSCOPY (EGD) WITH PROPOFOL;  Surgeon: [name], MD;  Location: [place];  Service: Gastroenterology;  Laterality: N/A;   HEMORRHOID SURGERY     HEMOSTASIS  CLIP PLACEMENT  01/22/2019   Procedure: HEMOSTASIS CLIP PLACEMENT;  Surgeon: [name], MD;  Location: [facility] ENDOSCOPY;  Service: Gastroenterology;;   KIDNEY TRANSPLANT  10/01/2013   left knee surgery     LOOP RECORDER IMPLANT  03/19/13   MDT LinQ implanted by Dr Rayann Heman for cryptogenic stroke   LOOP RECORDER IMPLANT N/A 03/19/2013   Procedure: LOOP RECORDER IMPLANT;  Surgeon: Coralyn Mark, MD;  Location: [facility] CATH LAB;  Service: Cardiovascular;  Laterality: N/A;   LUMBAR FUSION  04/08/2020   Dr. Newman Pies   NEPHRECTOMY TRANSPLANTED ORGAN     NEUROPLASTY / TRANSPOSITION MEDIAN NERVE AT CARPAL TUNNEL BILATERAL     right leg surgery     pin in place   right wrist surgery     SCLEROTHERAPY  01/22/2019   Procedure: Clide Deutscher;  Surgeon: [name], MD;  Location[facility] ENDOSCOPY;  Service: Gastroenterology;;   TEE WITHOUT CARDIOVERSION N/A 03/19/2013   Procedure: TRANSESOPHAGEAL ECHOCARDIOGRAM (TEE);  Surgeon: [name], MD;  Location: [facility] ENDOSCOPY;  Service: Cardiovascular;  Laterality: N/A;   THYROID SURGERY  10/13/2015   Performed at [facility] with surgical pathology revealed consistent with benign follicular nodule ([name] category II)    Current Medications: Current Meds  Medication Sig   amLODipine (NORVASC) 10 MG tablet Take 10 mg by mouth daily.   aspirin 81 MG EC tablet Take 81 mg by mouth daily. Swallow whole.   atorvastatin (LIPITOR) 40 MG tablet Take 40 mg by mouth at bedtime.   calcitRIOL (ROCALTROL) 0.25 MCG capsule Take 0.25 mcg by mouth every Monday, Tuesday, Wednesday, Thursday, and Friday.   cholecalciferol (VITAMIN D3) 25 MCG (1000 UNIT) tablet Take 1,000 Units by mouth daily.   colchicine 0.6 MG tablet Take 0.6 mg by mouth 2 (two) times daily.   febuxostat (ULORIC) 40 MG tablet Take 1 tablet by mouth daily.   furosemide (LASIX) 40 MG tablet Take 80 mg by mouth in the morning. 1 every afternoon   gabapentin (NEURONTIN) 300 MG capsule Take 300 mg by mouth 3  (three) times daily.   HYDROcodone-acetaminophen (NORCO) 10-325 MG tablet Take 1-2 tablets by mouth every 4 (four) hours as needed for moderate pain.   Insulin Glargine (BASAGLAR KWIKPEN) 100 UNIT/ML Inject 49 Units into the skin at bedtime.   insulin lispro (HUMALOG KWIKPEN) 100 UNIT/ML KwikPen Inject 5 Units into the skin See admin instructions. Sliding scale over 150 add 2 units Three times a day   labetalol (NORMODYNE) 100 MG tablet Take 100 mg by mouth 2 (two) times daily.   methimazole (TAPAZOLE) 5 MG tablet Take 2.5 mg by mouth daily.    Multiple Vitamin (MULTIVITAMIN WITH MINERALS) TABS tablet Take 1 tablet by mouth daily. Unknown strength   mycophenolate (MYFORTIC) 180 MG EC tablet Take 180 mg by mouth 2 (two) times daily.    ONETOUCH ULTRA test strip 1 each by Other route as needed for other (Glucose check).   pantoprazole (PROTONIX) 40 MG tablet Take 1 tablet (40 mg total) by mouth 2 (two) times daily before a meal.   predniSONE (DELTASONE) 5 MG tablet Take 1 tablet (5 mg total) by mouth daily with breakfast.   St Johns Wort 300 MG CAPS Take 300 mg by mouth 3 (three) times daily.   sulfamethoxazole-trimethoprim (BACTRIM) 400-80 MG tablet Take 1 tablet by mouth every Monday, Wednesday, and Friday.   tacrolimus (PROGRAF) 1 MG capsule Take 2 mg by mouth 2 (two) times daily.   tamsulosin (FLOMAX) 0.4 MG CAPS capsule Take 0.4 mg by mouth at bedtime.   TRADJENTA 5 MG TABS tablet Take 5 mg by mouth daily.   vitamin B-12 (CYANOCOBALAMIN) 1000 MCG tablet Take 1,000 mcg by mouth 2 (two) times daily.    vitamin C (ASCORBIC ACID) 500 MG tablet Take 500 mg by mouth daily.   Zinc 30 MG CAPS Take 30 mg by mouth 2 (two) times daily.     Allergies:   Lisinopril, Meloxicam, Victoza [liraglutide], and Cyclobenzaprine   Social History   Socioeconomic History   Marital status: Married    Spouse name: agnes   Number of children: 5   Years of education: 8th   Highest education level: Not on file   Occupational History   Occupation: disabled  Tobacco Use   Smoking  status: Former    Packs/day: 0.50    Years: 40.00    Total pack years: 20.00    Types: Cigarettes    Quit date: 12/27/2012    Years since quitting: 9.1   Smokeless tobacco: Never  Vaping Use   Vaping Use: Never used  Substance and Sexual Activity   Alcohol use: No    Alcohol/week: 0.0 standard drinks of alcohol   Drug use: No   Sexual activity: Yes  Other Topics Concern   Not on file  Social History Narrative   Patient lives at home with his wife Herbert Pun).  One story home with basement.   Patient is disabled.   Education 8th grade.   Right handed.   Caffeine One cup of coffee daily.   Retired Administrator.   5 children.   Social Determinants of Health   Financial Resource Strain: Not on file  Food Insecurity: No Food Insecurity (01/28/2019)   Hunger Vital Sign    Worried About Running Out of Food in the Last Year: Never true    Ran Out of Food in the Last Year: Never true  Transportation Needs: No Transportation Needs (01/28/2019)   PRAPARE - Hydrologist (Medical): No    Lack of Transportation (Non-Medical): No  Physical Activity: Not on file  Stress: Not on file  Social Connections: Not on file     Family History: The patient's family history includes Diabetes in his brother; Heart disease in his father; Hyperlipidemia in his father and son; Hypertension in his father and mother; Renal Disease in his father. ROS:   Please see the history of present illness.    All 14 point review of systems negative except as described per history of present illness  EKGs/Labs/Other Studies Reviewed:      Recent Labs: 10/12/2021: ALT 45; BUN 53; Creatinine 2.5; Hemoglobin 12.6; Platelets 135; Potassium 4.5; Sodium 143  Recent Lipid Panel    Component Value Date/Time   CHOL 137 01/02/2017 0343   TRIG 113 01/04/2019 0011   HDL 38 (L) 01/02/2017 0343   CHOLHDL 3.6 01/02/2017 0343    VLDL 16 01/02/2017 0343   LDLCALC 83 01/02/2017 0343    Physical Exam:    VS:  BP (!) 142/66 (BP Location: Right Arm, Patient Position: Sitting)   Pulse 72   Ht 5' 11"$  (1.803 m)   Wt 255 lb (115.7 kg)   SpO2 96%   BMI 35.57 kg/m     Wt Readings from Last 3 Encounters:  02/25/22 255 lb (115.7 kg)  01/25/22 251 lb 3.2 oz (113.9 kg)  10/12/21 250 lb (113.4 kg)     GEN:  Well nourished, well developed in no acute distress HEENT: Normal NECK: No JVD; No carotid bruits LYMPHATICS: No lymphadenopathy CARDIAC: RRR, no murmurs, no rubs, no gallops RESPIRATORY:  Clear to auscultation without rales, wheezing or rhonchi  ABDOMEN: Soft, non-tender, non-distended MUSCULOSKELETAL:  No edema; No deformity  SKIN: Warm and dry LOWER EXTREMITIES: no swelling NEUROLOGIC:  Alert and oriented x 3 PSYCHIATRIC:  Normal affect   ASSESSMENT:    1. Aneurysm of ascending aorta without rupture ([place])   2. Chronic ischemic heart disease   3. Essential (primary) hypertension   4. Pulmonary hypertension, unspecified ([place])   5. OSA (obstructive sleep apnea)    PLAN:    In order of problems listed above:  Aneurysm of the ascending aorta still the same size 40 mm continue monitoring. Chronic ischemic  heart disease.  Stable, stress test done last time in February last year which showed normal left ventricle ejection fraction without ischemia. Essential hypertension blood presumed to well-controlled OSA to be elevated while in the office. Pulmonary hypertension only mild.  Most likely related to chronic kidney failure as well as obstructive sleep apnea. Obstructive sleep apnea: That managed by primary care physicians.  Dyslipidemia I did review his K PN which show me data from May 2023 with total cholesterol 142 HDL 32 continue present management   Medication Adjustments/Labs and Tests Ordered: Current medicines are reviewed at length with the patient today.  Concerns regarding medicines are  outlined above.  No orders of the defined types were placed in this encounter.  Medication changes: No orders of the defined types were placed in this encounter.   Signed, Park Liter, MD, [facility] 02/25/2022 2:31 PM    [place]

## 2022-02-25 NOTE — Patient Instructions (Signed)

## 2022-03-10 DIAGNOSIS — I10 Essential (primary) hypertension: Secondary | ICD-10-CM | POA: Diagnosis not present

## 2022-03-10 DIAGNOSIS — N189 Chronic kidney disease, unspecified: Secondary | ICD-10-CM | POA: Diagnosis not present

## 2022-03-10 DIAGNOSIS — E1165 Type 2 diabetes mellitus with hyperglycemia: Secondary | ICD-10-CM | POA: Diagnosis not present

## 2022-03-10 DIAGNOSIS — I509 Heart failure, unspecified: Secondary | ICD-10-CM | POA: Diagnosis not present

## 2022-03-23 DIAGNOSIS — M5136 Other intervertebral disc degeneration, lumbar region: Secondary | ICD-10-CM | POA: Diagnosis not present

## 2022-04-04 DIAGNOSIS — Z794 Long term (current) use of insulin: Secondary | ICD-10-CM | POA: Diagnosis not present

## 2022-04-04 DIAGNOSIS — E1129 Type 2 diabetes mellitus with other diabetic kidney complication: Secondary | ICD-10-CM | POA: Diagnosis not present

## 2022-04-12 DIAGNOSIS — E1129 Type 2 diabetes mellitus with other diabetic kidney complication: Secondary | ICD-10-CM | POA: Diagnosis not present

## 2022-04-12 DIAGNOSIS — N184 Chronic kidney disease, stage 4 (severe): Secondary | ICD-10-CM | POA: Diagnosis not present

## 2022-04-20 DIAGNOSIS — K922 Gastrointestinal hemorrhage, unspecified: Secondary | ICD-10-CM | POA: Diagnosis not present

## 2022-04-20 DIAGNOSIS — G4733 Obstructive sleep apnea (adult) (pediatric): Secondary | ICD-10-CM | POA: Diagnosis not present

## 2022-04-20 DIAGNOSIS — Z940 Kidney transplant status: Secondary | ICD-10-CM | POA: Diagnosis not present

## 2022-04-20 DIAGNOSIS — E1129 Type 2 diabetes mellitus with other diabetic kidney complication: Secondary | ICD-10-CM | POA: Diagnosis not present

## 2022-04-20 DIAGNOSIS — B348 Other viral infections of unspecified site: Secondary | ICD-10-CM | POA: Diagnosis not present

## 2022-04-20 DIAGNOSIS — I10 Essential (primary) hypertension: Secondary | ICD-10-CM | POA: Diagnosis not present

## 2022-04-20 DIAGNOSIS — R634 Abnormal weight loss: Secondary | ICD-10-CM | POA: Diagnosis not present

## 2022-04-20 DIAGNOSIS — N189 Chronic kidney disease, unspecified: Secondary | ICD-10-CM | POA: Diagnosis not present

## 2022-04-20 DIAGNOSIS — N2581 Secondary hyperparathyroidism of renal origin: Secondary | ICD-10-CM | POA: Diagnosis not present

## 2022-04-20 DIAGNOSIS — R809 Proteinuria, unspecified: Secondary | ICD-10-CM | POA: Diagnosis not present

## 2022-04-20 DIAGNOSIS — D631 Anemia in chronic kidney disease: Secondary | ICD-10-CM | POA: Diagnosis not present

## 2022-04-20 DIAGNOSIS — M109 Gout, unspecified: Secondary | ICD-10-CM | POA: Diagnosis not present

## 2022-04-21 DIAGNOSIS — G8929 Other chronic pain: Secondary | ICD-10-CM | POA: Diagnosis not present

## 2022-04-21 DIAGNOSIS — R269 Unspecified abnormalities of gait and mobility: Secondary | ICD-10-CM | POA: Diagnosis not present

## 2022-04-21 DIAGNOSIS — I10 Essential (primary) hypertension: Secondary | ICD-10-CM | POA: Diagnosis not present

## 2022-04-21 DIAGNOSIS — M1990 Unspecified osteoarthritis, unspecified site: Secondary | ICD-10-CM | POA: Diagnosis not present

## 2022-04-21 DIAGNOSIS — M549 Dorsalgia, unspecified: Secondary | ICD-10-CM | POA: Diagnosis not present

## 2022-05-30 DIAGNOSIS — E1165 Type 2 diabetes mellitus with hyperglycemia: Secondary | ICD-10-CM | POA: Diagnosis not present

## 2022-05-30 DIAGNOSIS — R2689 Other abnormalities of gait and mobility: Secondary | ICD-10-CM | POA: Diagnosis not present

## 2022-05-30 DIAGNOSIS — I2510 Atherosclerotic heart disease of native coronary artery without angina pectoris: Secondary | ICD-10-CM | POA: Diagnosis not present

## 2022-05-30 DIAGNOSIS — M1990 Unspecified osteoarthritis, unspecified site: Secondary | ICD-10-CM | POA: Diagnosis not present

## 2022-05-30 DIAGNOSIS — E1142 Type 2 diabetes mellitus with diabetic polyneuropathy: Secondary | ICD-10-CM | POA: Diagnosis not present

## 2022-05-30 DIAGNOSIS — I10 Essential (primary) hypertension: Secondary | ICD-10-CM | POA: Diagnosis not present

## 2022-06-02 DIAGNOSIS — E0590 Thyrotoxicosis, unspecified without thyrotoxic crisis or storm: Secondary | ICD-10-CM | POA: Diagnosis not present

## 2022-06-10 DIAGNOSIS — E1165 Type 2 diabetes mellitus with hyperglycemia: Secondary | ICD-10-CM | POA: Diagnosis not present

## 2022-06-10 DIAGNOSIS — I509 Heart failure, unspecified: Secondary | ICD-10-CM | POA: Diagnosis not present

## 2022-06-20 DIAGNOSIS — M5136 Other intervertebral disc degeneration, lumbar region: Secondary | ICD-10-CM | POA: Diagnosis not present

## 2022-06-21 DIAGNOSIS — L578 Other skin changes due to chronic exposure to nonionizing radiation: Secondary | ICD-10-CM | POA: Diagnosis not present

## 2022-06-21 DIAGNOSIS — L820 Inflamed seborrheic keratosis: Secondary | ICD-10-CM | POA: Diagnosis not present

## 2022-06-21 DIAGNOSIS — L821 Other seborrheic keratosis: Secondary | ICD-10-CM | POA: Diagnosis not present

## 2022-07-04 DIAGNOSIS — Z794 Long term (current) use of insulin: Secondary | ICD-10-CM | POA: Diagnosis not present

## 2022-07-04 DIAGNOSIS — E1129 Type 2 diabetes mellitus with other diabetic kidney complication: Secondary | ICD-10-CM | POA: Diagnosis not present

## 2022-07-09 DIAGNOSIS — G4733 Obstructive sleep apnea (adult) (pediatric): Secondary | ICD-10-CM | POA: Diagnosis not present

## 2022-08-24 ENCOUNTER — Telehealth: Payer: Self-pay | Admitting: Internal Medicine

## 2022-08-24 DIAGNOSIS — G4733 Obstructive sleep apnea (adult) (pediatric): Secondary | ICD-10-CM

## 2022-08-24 NOTE — Telephone Encounter (Signed)
PT's wife calling because they got notice from [facility] that they no longer take [employer] and she would need to call [facility].  PT needs all necessary docs and a new RX sent to [facility] in order to continue CPAP therapy.  Wife is @ [phone]

## 2022-08-31 NOTE — Telephone Encounter (Signed)
New order placed

## 2022-09-08 ENCOUNTER — Telehealth: Payer: Self-pay | Admitting: Internal Medicine

## 2022-09-08 ENCOUNTER — Other Ambulatory Visit: Payer: Self-pay

## 2022-09-08 DIAGNOSIS — G4733 Obstructive sleep apnea (adult) (pediatric): Secondary | ICD-10-CM

## 2022-09-08 NOTE — Telephone Encounter (Signed)
Note says 5-20 but ordered in 2019 5-29. Please let me know correct settings.

## 2022-09-08 NOTE — Telephone Encounter (Signed)
Sorry for typo- CPAP auto 5-20

## 2022-09-08 NOTE — Telephone Encounter (Signed)
Correct order has been placed. Closing encounter.NFN

## 2022-09-14 DIAGNOSIS — Z940 Kidney transplant status: Secondary | ICD-10-CM | POA: Diagnosis not present

## 2022-09-14 DIAGNOSIS — R634 Abnormal weight loss: Secondary | ICD-10-CM | POA: Diagnosis not present

## 2022-09-14 DIAGNOSIS — K922 Gastrointestinal hemorrhage, unspecified: Secondary | ICD-10-CM | POA: Diagnosis not present

## 2022-09-14 DIAGNOSIS — M109 Gout, unspecified: Secondary | ICD-10-CM | POA: Diagnosis not present

## 2022-09-14 DIAGNOSIS — I10 Essential (primary) hypertension: Secondary | ICD-10-CM | POA: Diagnosis not present

## 2022-09-14 DIAGNOSIS — E1129 Type 2 diabetes mellitus with other diabetic kidney complication: Secondary | ICD-10-CM | POA: Diagnosis not present

## 2022-09-14 DIAGNOSIS — B348 Other viral infections of unspecified site: Secondary | ICD-10-CM | POA: Diagnosis not present

## 2022-09-14 DIAGNOSIS — N189 Chronic kidney disease, unspecified: Secondary | ICD-10-CM | POA: Diagnosis not present

## 2022-09-14 DIAGNOSIS — R809 Proteinuria, unspecified: Secondary | ICD-10-CM | POA: Diagnosis not present

## 2022-09-14 DIAGNOSIS — N2581 Secondary hyperparathyroidism of renal origin: Secondary | ICD-10-CM | POA: Diagnosis not present

## 2022-09-14 DIAGNOSIS — D631 Anemia in chronic kidney disease: Secondary | ICD-10-CM | POA: Diagnosis not present

## 2022-09-16 DIAGNOSIS — Z9181 History of falling: Secondary | ICD-10-CM | POA: Diagnosis not present

## 2022-09-16 DIAGNOSIS — M542 Cervicalgia: Secondary | ICD-10-CM | POA: Diagnosis not present

## 2022-09-16 DIAGNOSIS — M170 Bilateral primary osteoarthritis of knee: Secondary | ICD-10-CM | POA: Diagnosis not present

## 2022-09-16 DIAGNOSIS — M5116 Intervertebral disc disorders with radiculopathy, lumbar region: Secondary | ICD-10-CM | POA: Diagnosis not present

## 2022-09-16 DIAGNOSIS — G629 Polyneuropathy, unspecified: Secondary | ICD-10-CM | POA: Diagnosis not present

## 2022-09-28 ENCOUNTER — Telehealth: Payer: Self-pay | Admitting: Internal Medicine

## 2022-09-28 DIAGNOSIS — Z981 Arthrodesis status: Secondary | ICD-10-CM | POA: Insufficient documentation

## 2022-09-28 NOTE — Telephone Encounter (Signed)
Inetta Fermo from [facility] is calling in regards to this patient. She will need a copy of office visit notes, prescription for cpap and diagnostic sleep study. Fax number: [phone]

## 2022-09-29 ENCOUNTER — Encounter: Payer: Self-pay | Admitting: Cardiology

## 2022-09-29 ENCOUNTER — Ambulatory Visit: Payer: Medicare Other | Attending: Cardiology | Admitting: Cardiology

## 2022-09-29 VITALS — BP 136/68 | HR 98 | Ht 71.0 in | Wt 264.0 lb

## 2022-09-29 DIAGNOSIS — I272 Pulmonary hypertension, unspecified: Secondary | ICD-10-CM

## 2022-09-29 DIAGNOSIS — I7121 Aneurysm of the ascending aorta, without rupture: Secondary | ICD-10-CM | POA: Diagnosis not present

## 2022-09-29 DIAGNOSIS — I10 Essential (primary) hypertension: Secondary | ICD-10-CM

## 2022-09-29 DIAGNOSIS — D849 Immunodeficiency, unspecified: Secondary | ICD-10-CM | POA: Diagnosis not present

## 2022-09-29 NOTE — Progress Notes (Signed)
Cardiology Office Note:    Date:  09/29/2022   ID:  Jon [name], DOB 1950-02-16, MRN [id number]  PCP:  Noni Saupe, MD  Cardiologist:  Gypsy Balsam, MD    Referring MD: Noni Saupe, MD   Chief Complaint  Patient presents with   Follow-up    History of Present Illness:    Arbor Bolosan is a 72 y.o. male with past medical history significant for kidney dysfunction, 7 years ago he did receive kidney transplant from his sister, additional problem include ascending aortic aneurysm measuring 40 mm, essential hypertension, diabetes, mild pulmonary hypertension, and obstructive sleep apnea.  Comes today to months for follow-up.  Overall doing well.  He denies have any chest pain tightness squeezing pressure burning chest no palpitations dizziness swelling of lower extremities.  Overall doing well  Past Medical History:  Diagnosis Date   Abnormal gait    Acute blood loss anemia    Acute renal failure superimposed on stage 4 chronic kidney disease (HCC) 06/04/2017   Acute respiratory failure with hypoxia (HCC) 01/04/2019   Covid pneumonia Dec, 2020   Acute right ankle pain 12/08/2016   AKI (acute kidney injury) (HCC) 12/27/2018   Anemia of chronic disease    Arteriovenous fistula for hemodialysis in place, primary [facility]) 08/08/2017   Overview:  Left upper extremity  Formatting of this note might be different from the original. Left upper extremity   Arthritis    back, hands    Ascending aortic aneurysm (HCC) 10/23/2017   3.9 cm by echocardiogram   Backache 01/27/2011   BK viremia 02/07/2014   Body mass index (BMI) 36.0-36.9, adult 01/16/2020   Cerebral infarction (HCC) 08/08/2017   Cerebrovascular accident (CVA) (HCC)    Cervical spondylosis 12/27/2012   Chronic back pain    radiculopathy and stenosis   Chronic ischemic heart disease    Severe LVH   Chronic kidney disease 2015   transplant   Congestive heart failure (CHF) (HCC)    COVID-19 virus  infection    CVA (cerebral infarction) 03/2013   Diabetes mellitus     Lantus nightly ;type 2   Diabetes mellitus (HCC) 01/23/2011   Formatting of this note might be different from the original. Currently on Insulin   Digital mucinous cyst of finger of left hand 10/10/2016   Duodenal ulcer hemorrhagic    Dysphagia 08/22/2017   Dyspnea    Dyspnea on exertion 08/24/2016   PFT 12/28/16-mild restriction, mild reduction of diffusion.  No response to bronchodilator.  FVC 3.28/70%, FEV1 2.64/75%, ratio 0.81, TLC 77%, DLCO 66%.   Elevated blood-pressure reading, without diagnosis of hypertension 02/25/2019   Elevated LFTs    Elevated serum creatinine 10/24/2013   Essential (primary) hypertension 11/27/2018   takes Amlodipine and Catapres daily    dr Lance Morin, Loop Recorder - [place]   Finger infection, fungal 11/22/2016   Fungal intestinal infection following organ transplantation 11/29/2016   GERD (gastroesophageal reflux disease)    GI bleed    5 units oblood    H/O hiatal hernia    Heart murmur    History of blood transfusion    History of colon polyps    History of kidney stones    passed   History of kidney transplant 01/24/2011   S/P renal transplant in 2015 at [facility]- followed by Dr Kathrene Bongo at [facility]   History of stroke 03/16/2013   HLD (hyperlipidemia) 05/02/2013   HTN (hypertension) 01/27/2011   Hx  of cardiovascular stress test    a.  Lexiscan Myoview (05/2013):  No ischemia, EF 53%; Normal Study   Hyperkalemia 01/23/2011   Hyperlipidemia    takes Lovastatin daily   Hypertension    takes Amlodipine and Catapres daily    dr Lance Morin, Loop Recorder - [facility]   Hypertension associated with transplantation 12/03/2014   Hypomagnesemia 10/25/2013   Immunosuppression (HCC) 10/14/2013   Immunosuppressive management encounter following kidney transplant 10/21/2013   Insulin dependent type 2 diabetes mellitus (HCC) 01/28/2014   Overview:  Currently on Insulin   Lumbago  with sciatica, right side 12/17/2019   Lumbar post-laminectomy syndrome 05/13/2015   Mass of soft tissue of left upper extremity 10/18/2016   Melena    Movement disorder 03/16/2013   Nocturia    Obesity    OSA (obstructive sleep apnea) 05/02/2013   Unattended Home Sleep Test 10/24/12-AHI 24/hour, desaturation to 81%.   Osteoarthritis (arthritis due to wear and tear of joints) 08/08/2017   Other chronic pain 12/17/2019   Pain at surgical site 10/01/2013   Peripheral edema    takes Lasix daily   Personal history of COVID-19    Pneumonia 2021   Pneumonia due to COVID-19 virus    Preop cardiovascular exam 03/22/2013   Pulmonary hypertension, unspecified (HCC) 01/23/2017   Scapholunate dissociation of left wrist 10/18/2016   Sleep apnea    cpap , study in their home, 09/2102- [employer]            Spinal stenosis 01/16/2020   Formatting of this note might be different from the original. Operated twice with sciatica with disectomy of lumbars   Stroke (HCC) 2015   speech, rt arm weakness- 04/07/20 - no residual    Swelling of lower extremity 06/30/2016   Upper GI bleed 01/20/2019   URI, acute 03/07/2014   Urinary frequency     Past Surgical History:  Procedure Laterality Date   AV FISTULA PLACEMENT Left 04/03/2013   Procedure: ARTERIOVENOUS (AV) FISTULA CREATION- LEFT RADIOCEPHALIC VS BRACHIOCEPHALIC;  Surgeon: Fransisco Hertz, MD;  Location: [facility] OR;  Service: Vascular;  Laterality: Left;   AV FISTULA PLACEMENT Left 05/14/2013   Procedure: LEFT BRACHIOCEPHALIC ARTERIOVENOUS (AV) FISTULA CREATION;  Surgeon: Fransisco Hertz, MD;  Location[facility] OR;  Service: Vascular;  Laterality: Left;   BACK SURGERY  12/2012   2003- 1st back surgery & then 2014- fusion   COLONOSCOPY     ESOPHAGOGASTRODUODENOSCOPY     ESOPHAGOGASTRODUODENOSCOPY (EGD) WITH PROPOFOL N/A 01/22/2019   Procedure: ESOPHAGOGASTRODUODENOSCOPY (EGD) WITH PROPOFOL;  Surgeon: Beverley Fiedler, MD;  Location: [facility] ENDOSCOPY;  Service: Gastroenterology;  Laterality:  N/A;   HEMORRHOID SURGERY     HEMOSTASIS CLIP PLACEMENT  01/22/2019   Procedure: HEMOSTASIS CLIP PLACEMENT;  Surgeon: Beverley Fiedler, MD;  Location: [facility] ENDOSCOPY;  Service: Gastroenterology;;   KIDNEY TRANSPLANT  10/01/2013   left knee surgery     LOOP RECORDER IMPLANT  03/19/13   MDT LinQ implanted by Dr Johney Frame for cryptogenic stroke   LOOP RECORDER IMPLANT N/A 03/19/2013   Procedure: LOOP RECORDER IMPLANT;  Surgeon: Gardiner Rhyme, MD;  Location: [facility] CATH LAB;  Service: Cardiovascular;  Laterality: N/A;   LUMBAR FUSION  04/08/2020   Dr. Tressie Stalker   NEPHRECTOMY TRANSPLANTED ORGAN     NEUROPLASTY / TRANSPOSITION MEDIAN NERVE AT CARPAL TUNNEL BILATERAL     right leg surgery     pin in place   right wrist surgery     SCLEROTHERAPY  01/22/2019   Procedure: Susa Day;  Surgeon: Beverley Fiedler, MD;  Location: [facility] ENDOSCOPY;  Service: Gastroenterology;;   TEE WITHOUT CARDIOVERSION N/A 03/19/2013   Procedure: TRANSESOPHAGEAL ECHOCARDIOGRAM (TEE);  Surgeon: Lewayne Bunting, MD;  Location: [facility] ENDOSCOPY;  Service: Cardiovascular;  Laterality: N/A;   THYROID SURGERY  10/13/2015   Performed at [facility] with surgical pathology revealed consistent with benign follicular nodule ([name] category II)    Current Medications: Current Meds  Medication Sig   amLODipine (NORVASC) 10 MG tablet Take 10 mg by mouth daily.   aspirin 81 MG EC tablet Take 81 mg by mouth daily. Swallow whole.   atorvastatin (LIPITOR) 40 MG tablet Take 40 mg by mouth at bedtime.   calcitRIOL (ROCALTROL) 0.25 MCG capsule Take 0.25 mcg by mouth every Monday, Tuesday, Wednesday, Thursday, and Friday.   cholecalciferol (VITAMIN D3) 25 MCG (1000 UNIT) tablet Take 1,000 Units by mouth daily.   colchicine 0.6 MG tablet Take 0.6 mg by mouth 2 (two) times daily.   febuxostat (ULORIC) 40 MG tablet Take 1 tablet by mouth daily.   furosemide (LASIX) 40 MG tablet Take 80 mg by mouth in the morning. 1 every afternoon   gabapentin (NEURONTIN)  300 MG capsule Take 300 mg by mouth 3 (three) times daily.   HYDROcodone-acetaminophen (NORCO) 10-325 MG tablet Take 1-2 tablets by mouth every 4 (four) hours as needed for moderate pain.   Insulin Glargine (BASAGLAR KWIKPEN) 100 UNIT/ML Inject 49 Units into the skin at bedtime.   insulin lispro (HUMALOG KWIKPEN) 100 UNIT/ML KwikPen Inject 5 Units into the skin See admin instructions. Sliding scale over 150 add 2 units Three times a day   labetalol (NORMODYNE) 100 MG tablet Take 100 mg by mouth 2 (two) times daily.   methimazole (TAPAZOLE) 5 MG tablet Take 2.5 mg by mouth daily.    Multiple Vitamin (MULTIVITAMIN WITH MINERALS) TABS tablet Take 1 tablet by mouth daily. Unknown strength   mycophenolate (MYFORTIC) 180 MG EC tablet Take 180 mg by mouth 2 (two) times daily.    ONETOUCH ULTRA test strip 1 each by Other route as needed for other (Glucose check).   pantoprazole (PROTONIX) 40 MG tablet Take 1 tablet (40 mg total) by mouth 2 (two) times daily before a meal.   predniSONE (DELTASONE) 5 MG tablet Take 1 tablet (5 mg total) by mouth daily with breakfast.   St Johns Wort 300 MG CAPS Take 300 mg by mouth 3 (three) times daily.   sulfamethoxazole-trimethoprim (BACTRIM) 400-80 MG tablet Take 1 tablet by mouth every Monday, Wednesday, and Friday.   tacrolimus (PROGRAF) 1 MG capsule Take 2 mg by mouth 2 (two) times daily.   tamsulosin (FLOMAX) 0.4 MG CAPS capsule Take 0.4 mg by mouth at bedtime.   TRADJENTA 5 MG TABS tablet Take 5 mg by mouth daily.   vitamin B-12 (CYANOCOBALAMIN) 1000 MCG tablet Take 1,000 mcg by mouth 2 (two) times daily.    vitamin C (ASCORBIC ACID) 500 MG tablet Take 500 mg by mouth daily.   Zinc 30 MG CAPS Take 30 mg by mouth 2 (two) times daily.     Allergies:   Lisinopril, Meloxicam, Victoza [liraglutide], and Cyclobenzaprine   Social History   Socioeconomic History   Marital status: Married    Spouse name: agnes   Number of children: 5   Years of education: 8th    Highest education level: Not on file  Occupational History   Occupation: disabled  Tobacco Use   Smoking  status: Former    Current packs/day: 0.00    Average packs/day: 0.5 packs/day for 40.0 years (20.0 ttl pk-yrs)    Types: Cigarettes    Start date: 12/27/1972    Quit date: 12/27/2012    Years since quitting: 9.7   Smokeless tobacco: Never  Vaping Use   Vaping status: Never Used  Substance and Sexual Activity   Alcohol use: No    Alcohol/week: 0.0 standard drinks of alcohol   Drug use: No   Sexual activity: Yes  Other Topics Concern   Not on file  Social History Narrative   Patient lives at home with his wife Nicole Kindred).  One story home with basement.   Patient is disabled.   Education 8th grade.   Right handed.   Caffeine One cup of coffee daily.   Retired Naval architect.   5 children.   Social Determinants of Health   Financial Resource Strain: Not on file  Food Insecurity: No Food Insecurity (01/28/2019)   Hunger Vital Sign    Worried About Running Out of Food in the Last Year: Never true    Ran Out of Food in the Last Year: Never true  Transportation Needs: No Transportation Needs (01/28/2019)   PRAPARE - Administrator, Civil Service (Medical): No    Lack of Transportation (Non-Medical): No  Physical Activity: Not on file  Stress: Not on file  Social Connections: Not on file     Family History: The patient's family history includes Diabetes in his brother; Heart disease in his father; Hyperlipidemia in his father and son; Hypertension in his father and mother; Renal Disease in his father. ROS:   Please see the history of present illness.    All 14 point review of systems negative except as described per history of present illness  EKGs/Labs/Other Studies Reviewed:    EKG Interpretation Date/Time:  Thursday September 29 2022 10:58:06 EDT Ventricular Rate:  98 PR Interval:  180 QRS Duration:  122 QT Interval:  380 QTC Calculation: 485 R  Axis:   253  Text Interpretation: Normal sinus rhythm Right bundle branch block Inferior infarct , age undetermined Abnormal ECG When compared with ECG of 25-Aug-2021 10:36, Vent. rate has increased BY  32 BPM Inferior infarct is now Present Confirmed by Gypsy Balsam [phone]) on 09/29/2022 11:19:49 AM    Recent Labs: 10/12/2021: ALT 45; BUN 53; Creatinine 2.5; Hemoglobin 12.6; Platelets 135; Potassium 4.5; Sodium 143  Recent Lipid Panel    Component Value Date/Time   CHOL 137 01/02/2017 0343   TRIG 113 01/04/2019 0011   HDL 38 (L) 01/02/2017 0343   CHOLHDL 3.6 01/02/2017 0343   VLDL 16 01/02/2017 0343   LDLCALC 83 01/02/2017 0343    Physical Exam:    VS:  BP 136/68 (BP Location: Right Arm, Patient Position: Sitting)   Pulse 98   Ht 5\' 11"  (1.803 m)   Wt 264 lb (119.7 kg)   SpO2 96%   BMI 36.82 kg/m     Wt Readings from Last 3 Encounters:  09/29/22 264 lb (119.7 kg)  02/25/22 255 lb (115.7 kg)  01/25/22 251 lb 3.2 oz (113.9 kg)     GEN:  Well nourished, well developed in no acute distress HEENT: Normal NECK: No JVD; No carotid bruits LYMPHATICS: No lymphadenopathy CARDIAC: RRR, systolic murmur holosystolic grade 2 over 1-2/6 best heard left border of the sternum, no rubs, no gallops RESPIRATORY:  Clear to auscultation without rales, wheezing or rhonchi  ABDOMEN: Soft, non-tender, non-distended MUSCULOSKELETAL:  No edema; No deformity  SKIN: Warm and dry LOWER EXTREMITIES: no swelling NEUROLOGIC:  Alert and oriented x 3 PSYCHIATRIC:  Normal affect   ASSESSMENT:    1. Essential (primary) hypertension   2. Aneurysm of ascending aorta without rupture (HCC)   3. Pulmonary hypertension, unspecified (HCC)   4. Immunosuppression (HCC)    PLAN:    In order of problems listed above:  Essential hypertension blood pressure well-controlled continue present management. Enlargement of the ascending aorta only 40 mm for his size is probably upper limits of normal.   Continue monitoring x-ray will do another echocardiogram. Pulmonary hypertension only mild.  Doing well from that point review. Status post kidney transplant on immunosuppression stable doing well   Medication Adjustments/Labs and Tests Ordered: Current medicines are reviewed at length with the patient today.  Concerns regarding medicines are outlined above.  Orders Placed This Encounter  Procedures   EKG 12-Lead   Medication changes: No orders of the defined types were placed in this encounter.   Signed, Georgeanna Lea, MD, [facility] 09/29/2022 11:31 AM    [facility]

## 2022-09-29 NOTE — Patient Instructions (Signed)

## 2022-09-30 NOTE — Telephone Encounter (Signed)
Records faxed to [phone]

## 2022-10-03 DIAGNOSIS — M5136 Other intervertebral disc degeneration, lumbar region: Secondary | ICD-10-CM | POA: Diagnosis not present

## 2022-10-03 DIAGNOSIS — E1129 Type 2 diabetes mellitus with other diabetic kidney complication: Secondary | ICD-10-CM | POA: Diagnosis not present

## 2022-10-03 DIAGNOSIS — Z794 Long term (current) use of insulin: Secondary | ICD-10-CM | POA: Diagnosis not present

## 2022-10-04 DIAGNOSIS — G4733 Obstructive sleep apnea (adult) (pediatric): Secondary | ICD-10-CM | POA: Diagnosis not present

## 2022-10-10 DIAGNOSIS — Z79621 Long term (current) use of calcineurin inhibitor: Secondary | ICD-10-CM | POA: Diagnosis not present

## 2022-10-10 DIAGNOSIS — Z5181 Encounter for therapeutic drug level monitoring: Secondary | ICD-10-CM | POA: Diagnosis not present

## 2022-10-10 DIAGNOSIS — Z79899 Other long term (current) drug therapy: Secondary | ICD-10-CM | POA: Diagnosis not present

## 2022-10-10 DIAGNOSIS — Z48298 Encounter for aftercare following other organ transplant: Secondary | ICD-10-CM | POA: Diagnosis not present

## 2022-10-10 DIAGNOSIS — Z23 Encounter for immunization: Secondary | ICD-10-CM | POA: Diagnosis not present

## 2022-10-10 DIAGNOSIS — D849 Immunodeficiency, unspecified: Secondary | ICD-10-CM | POA: Diagnosis not present

## 2022-10-10 DIAGNOSIS — Z940 Kidney transplant status: Secondary | ICD-10-CM | POA: Diagnosis not present

## 2022-10-12 NOTE — Progress Notes (Signed)
[facility]  [address] [place],  [place]  [address] [phone]  Clinic Day:  10/13/2022  Referring physician: Noni Saupe, MD  HISTORY OF PRESENT ILLNESS:  The patient is a 72 y.o. male with anemia secondary to iron deficiency and chronic renal insufficiency.  In the past, IV iron was effective in replenishing his iron stores and improving his hemoglobin.  He comes in today to reassess his hemoglobin.  Since his last visit, the patient has been doing fine.  He denies having increased fatigue or any overt forms of blood loss which concern him for progressive anemia.  PHYSICAL EXAM:  Blood pressure (!) 181/79, pulse 78, temperature 98.1 F (36.7 C), resp. rate 18, height 5\' 11"  (1.803 m), weight 260 lb 4.8 oz (118.1 kg), SpO2 95%. Wt Readings from Last 3 Encounters:  10/13/22 260 lb 4.8 oz (118.1 kg)  09/29/22 264 lb (119.7 kg)  02/25/22 255 lb (115.7 kg)   Body mass index is 36.3 kg/m. Performance status (ECOG): 1 - Symptomatic but completely ambulatory Physical Exam Constitutional:      General: He is not in acute distress.    Appearance: Normal appearance. He is normal weight.  HENT:     Head: Normocephalic and atraumatic.  Eyes:     General: No scleral icterus.    Extraocular Movements: Extraocular movements intact.     Conjunctiva/sclera: Conjunctivae normal.     Pupils: Pupils are equal, round, and reactive to light.  Cardiovascular:     Rate and Rhythm: Normal rate and regular rhythm.     Pulses: Normal pulses.     Heart sounds: Normal heart sounds. No murmur heard.    No friction rub. No gallop.  Pulmonary:     Effort: Pulmonary effort is normal. No respiratory distress.     Breath sounds: Normal breath sounds.  Abdominal:     General: Bowel sounds are normal. There is no distension.     Palpations: Abdomen is soft. There is no hepatomegaly, splenomegaly or mass.     Tenderness: There is no abdominal tenderness.   Musculoskeletal:        General: Normal range of motion.     Cervical back: Normal range of motion and neck supple.     Right lower leg: No edema.     Left lower leg: No edema.  Lymphadenopathy:     Cervical: No cervical adenopathy.  Skin:    General: Skin is warm and dry.  Neurological:     General: No focal deficit present.     Mental Status: He is alert and oriented to person, place, and time. Mental status is at baseline.  Psychiatric:        Mood and Affect: Mood normal.        Behavior: Behavior normal.        Thought Content: Thought content normal.        Judgment: Judgment normal.    LABS:      Latest Ref Rng & Units 10/13/2022    8:49 AM 10/12/2021   12:00 AM 04/12/2021   12:00 AM  CBC  WBC  7.2     8.6     9.3      Hemoglobin 13.5 - 17.5 12.6     12.6     12.2      Hematocrit 41 - 53 38     37     37      Platelets 150 -  400 K/uL 108     135     148         This result is from an external source.      Latest Ref Rng & Units 10/13/2022    8:50 AM 10/12/2021   12:00 AM 04/12/2021   12:00 AM  CMP  Glucose 70 - 99 mg/dL 161     BUN 8 - 23 mg/dL 40  53     36      Creatinine 0.61 - 1.24 mg/dL 0.96  2.5     2.1      Sodium 135 - 145 mmol/L 142  143     141      Potassium 3.5 - 5.1 mmol/L 4.5  4.5     4.8      Chloride 98 - 111 mmol/L 112  110     107      CO2 22 - 32 mmol/L 21  22     26       Calcium 8.9 - 10.3 mg/dL 8.7  9.1     8.7      Total Protein 6.5 - 8.1 g/dL 6.9     Total Bilirubin 0.3 - 1.2 mg/dL 0.6     Alkaline Phos 38 - 126 U/L 132  112     118      AST 15 - 41 U/L 67  32     32      ALT 0 - 44 U/L 71  45     43         This result is from an external source.    Latest Reference Range & Units 10/12/21 09:14  Iron 45 - 182 ug/dL 77  UIBC ug/dL 045  TIBC 409 - 811 ug/dL 914  Saturation Ratios 17.9 - 39.5 % 25  Ferritin 24 - 336 ng/mL 129   ASSESSMENT & PLAN:  Assessment/Plan:  A 72 y.o. male with anemia secondary to iron deficiency and chronic  renal insufficiency.  I am pleased that his hemoglobin remains well above 12.  Furthermore, his iron parameters remain normal.  As it pertains to his transplanted kidney, he knows it remains imperative to keep his hypertension and diabetes under tight control in order to prevent its function from declining over time, which could also exacerbate his anemia.  Clinically, he appears to be doing well.  As it is the case, I do feel comfortable seeing him back in 1 year for repeat clinical assessment.  The patient understands all the plans discussed today and is in agreement with them.    Natalija Mavis Kirby Funk, MD

## 2022-10-13 ENCOUNTER — Other Ambulatory Visit: Payer: Self-pay | Admitting: Oncology

## 2022-10-13 ENCOUNTER — Inpatient Hospital Stay: Payer: Medicare Other

## 2022-10-13 ENCOUNTER — Inpatient Hospital Stay: Payer: Medicare Other | Attending: Oncology | Admitting: Oncology

## 2022-10-13 VITALS — BP 181/79 | HR 78 | Temp 98.1°F | Resp 18 | Ht 71.0 in | Wt 260.3 lb

## 2022-10-13 DIAGNOSIS — N189 Chronic kidney disease, unspecified: Secondary | ICD-10-CM | POA: Diagnosis not present

## 2022-10-13 DIAGNOSIS — D509 Iron deficiency anemia, unspecified: Secondary | ICD-10-CM | POA: Insufficient documentation

## 2022-10-13 DIAGNOSIS — Z940 Kidney transplant status: Secondary | ICD-10-CM | POA: Insufficient documentation

## 2022-10-13 DIAGNOSIS — D631 Anemia in chronic kidney disease: Secondary | ICD-10-CM | POA: Insufficient documentation

## 2022-10-13 DIAGNOSIS — E1122 Type 2 diabetes mellitus with diabetic chronic kidney disease: Secondary | ICD-10-CM | POA: Diagnosis not present

## 2022-10-13 DIAGNOSIS — D649 Anemia, unspecified: Secondary | ICD-10-CM | POA: Diagnosis not present

## 2022-10-13 DIAGNOSIS — I129 Hypertensive chronic kidney disease with stage 1 through stage 4 chronic kidney disease, or unspecified chronic kidney disease: Secondary | ICD-10-CM | POA: Insufficient documentation

## 2022-10-13 LAB — IRON AND TIBC
Iron: 104 ug/dL (ref 45–182)
Saturation Ratios: 29 % (ref 17.9–39.5)
TIBC: 356 ug/dL (ref 250–450)
UIBC: 252 ug/dL

## 2022-10-13 LAB — VITAMIN B12: Vitamin B-12: 989 pg/mL — ABNORMAL HIGH (ref 180–914)

## 2022-10-13 LAB — CMP (CANCER CENTER ONLY)
ALT: 71 U/L — ABNORMAL HIGH (ref 0–44)
AST: 67 U/L — ABNORMAL HIGH (ref 15–41)
Albumin: 3.7 g/dL (ref 3.5–5.0)
Alkaline Phosphatase: 132 U/L — ABNORMAL HIGH (ref 38–126)
Anion gap: 9 (ref 5–15)
BUN: 40 mg/dL — ABNORMAL HIGH (ref 8–23)
CO2: 21 mmol/L — ABNORMAL LOW (ref 22–32)
Calcium: 8.7 mg/dL — ABNORMAL LOW (ref 8.9–10.3)
Chloride: 112 mmol/L — ABNORMAL HIGH (ref 98–111)
Creatinine: 2.36 mg/dL — ABNORMAL HIGH (ref 0.61–1.24)
GFR, Estimated: 29 mL/min — ABNORMAL LOW (ref >60)
Glucose, Bld: 138 mg/dL — ABNORMAL HIGH (ref 70–99)
Potassium: 4.5 mmol/L (ref 3.5–5.1)
Sodium: 142 mmol/L (ref 135–145)
Total Bilirubin: 0.6 mg/dL (ref 0.3–1.2)
Total Protein: 6.9 g/dL (ref 6.5–8.1)

## 2022-10-13 LAB — FERRITIN: Ferritin: 114 ng/mL (ref 24–336)

## 2022-10-13 LAB — CBC AND DIFFERENTIAL
HCT: 38 — AB (ref 41–53)
Hemoglobin: 12.6 — AB (ref 13.5–17.5)
Neutrophils Absolute: 5.18
Platelets: 108 10*3/uL — AB (ref 150–400)
WBC: 7.2

## 2022-10-13 LAB — FOLATE: Folate: 20.1 ng/mL (ref >5.9)

## 2022-10-13 LAB — CBC: RBC: 4 (ref 3.87–5.11)

## 2022-10-14 ENCOUNTER — Telehealth: Payer: Self-pay | Admitting: Oncology

## 2022-10-14 ENCOUNTER — Encounter: Payer: Self-pay | Admitting: Oncology

## 2022-10-14 NOTE — Telephone Encounter (Signed)
Patient has been scheduled. Aware of appt date and time.    Scheduling Message Entered by Rennis Harding A on 10/13/2022 at  4:53 PM Priority: Routine <No visit type provided>  Department: [facility]  Provider:  Scheduling Notes:  Labs/appt 10-13-23

## 2022-10-16 DIAGNOSIS — M5116 Intervertebral disc disorders with radiculopathy, lumbar region: Secondary | ICD-10-CM | POA: Diagnosis not present

## 2022-10-16 DIAGNOSIS — M170 Bilateral primary osteoarthritis of knee: Secondary | ICD-10-CM | POA: Diagnosis not present

## 2022-10-16 DIAGNOSIS — M542 Cervicalgia: Secondary | ICD-10-CM | POA: Diagnosis not present

## 2022-10-16 DIAGNOSIS — G629 Polyneuropathy, unspecified: Secondary | ICD-10-CM | POA: Diagnosis not present

## 2022-10-16 DIAGNOSIS — Z9181 History of falling: Secondary | ICD-10-CM | POA: Diagnosis not present

## 2022-10-24 DIAGNOSIS — Z940 Kidney transplant status: Secondary | ICD-10-CM | POA: Diagnosis not present

## 2022-10-24 DIAGNOSIS — N2581 Secondary hyperparathyroidism of renal origin: Secondary | ICD-10-CM | POA: Diagnosis not present

## 2022-11-08 DIAGNOSIS — L570 Actinic keratosis: Secondary | ICD-10-CM | POA: Diagnosis not present

## 2022-11-08 DIAGNOSIS — L578 Other skin changes due to chronic exposure to nonionizing radiation: Secondary | ICD-10-CM | POA: Diagnosis not present

## 2022-11-08 DIAGNOSIS — L821 Other seborrheic keratosis: Secondary | ICD-10-CM | POA: Diagnosis not present

## 2022-11-16 DIAGNOSIS — Z9181 History of falling: Secondary | ICD-10-CM | POA: Diagnosis not present

## 2022-11-16 DIAGNOSIS — M170 Bilateral primary osteoarthritis of knee: Secondary | ICD-10-CM | POA: Diagnosis not present

## 2022-11-16 DIAGNOSIS — M5116 Intervertebral disc disorders with radiculopathy, lumbar region: Secondary | ICD-10-CM | POA: Diagnosis not present

## 2022-11-16 DIAGNOSIS — M542 Cervicalgia: Secondary | ICD-10-CM | POA: Diagnosis not present

## 2022-11-16 DIAGNOSIS — G629 Polyneuropathy, unspecified: Secondary | ICD-10-CM | POA: Diagnosis not present

## 2022-11-21 DIAGNOSIS — N184 Chronic kidney disease, stage 4 (severe): Secondary | ICD-10-CM | POA: Diagnosis not present

## 2022-11-21 DIAGNOSIS — I130 Hypertensive heart and chronic kidney disease with heart failure and stage 1 through stage 4 chronic kidney disease, or unspecified chronic kidney disease: Secondary | ICD-10-CM | POA: Diagnosis not present

## 2022-11-21 DIAGNOSIS — E1169 Type 2 diabetes mellitus with other specified complication: Secondary | ICD-10-CM | POA: Diagnosis not present

## 2022-11-21 DIAGNOSIS — E785 Hyperlipidemia, unspecified: Secondary | ICD-10-CM | POA: Diagnosis not present

## 2022-11-21 DIAGNOSIS — I259 Chronic ischemic heart disease, unspecified: Secondary | ICD-10-CM | POA: Diagnosis not present

## 2022-11-21 DIAGNOSIS — R011 Cardiac murmur, unspecified: Secondary | ICD-10-CM | POA: Diagnosis not present

## 2022-11-21 DIAGNOSIS — K219 Gastro-esophageal reflux disease without esophagitis: Secondary | ICD-10-CM | POA: Diagnosis not present

## 2022-11-21 DIAGNOSIS — I7121 Aneurysm of the ascending aorta, without rupture: Secondary | ICD-10-CM | POA: Diagnosis not present

## 2022-11-21 DIAGNOSIS — I10 Essential (primary) hypertension: Secondary | ICD-10-CM | POA: Diagnosis not present

## 2022-11-21 DIAGNOSIS — G4730 Sleep apnea, unspecified: Secondary | ICD-10-CM | POA: Diagnosis not present

## 2022-11-21 DIAGNOSIS — E1122 Type 2 diabetes mellitus with diabetic chronic kidney disease: Secondary | ICD-10-CM | POA: Diagnosis not present

## 2022-11-21 DIAGNOSIS — N189 Chronic kidney disease, unspecified: Secondary | ICD-10-CM | POA: Diagnosis not present

## 2022-11-21 DIAGNOSIS — I509 Heart failure, unspecified: Secondary | ICD-10-CM | POA: Diagnosis not present

## 2022-12-06 DIAGNOSIS — N2581 Secondary hyperparathyroidism of renal origin: Secondary | ICD-10-CM | POA: Diagnosis not present

## 2022-12-06 DIAGNOSIS — E1129 Type 2 diabetes mellitus with other diabetic kidney complication: Secondary | ICD-10-CM | POA: Diagnosis not present

## 2022-12-06 DIAGNOSIS — N189 Chronic kidney disease, unspecified: Secondary | ICD-10-CM | POA: Diagnosis not present

## 2022-12-06 DIAGNOSIS — E790 Hyperuricemia without signs of inflammatory arthritis and tophaceous disease: Secondary | ICD-10-CM | POA: Diagnosis not present

## 2022-12-06 DIAGNOSIS — K922 Gastrointestinal hemorrhage, unspecified: Secondary | ICD-10-CM | POA: Diagnosis not present

## 2022-12-06 DIAGNOSIS — Z940 Kidney transplant status: Secondary | ICD-10-CM | POA: Diagnosis not present

## 2022-12-15 DIAGNOSIS — K922 Gastrointestinal hemorrhage, unspecified: Secondary | ICD-10-CM | POA: Diagnosis not present

## 2022-12-15 DIAGNOSIS — N189 Chronic kidney disease, unspecified: Secondary | ICD-10-CM | POA: Diagnosis not present

## 2022-12-15 DIAGNOSIS — I10 Essential (primary) hypertension: Secondary | ICD-10-CM | POA: Diagnosis not present

## 2022-12-15 DIAGNOSIS — R634 Abnormal weight loss: Secondary | ICD-10-CM | POA: Diagnosis not present

## 2022-12-15 DIAGNOSIS — B348 Other viral infections of unspecified site: Secondary | ICD-10-CM | POA: Diagnosis not present

## 2022-12-15 DIAGNOSIS — M109 Gout, unspecified: Secondary | ICD-10-CM | POA: Diagnosis not present

## 2022-12-15 DIAGNOSIS — N2581 Secondary hyperparathyroidism of renal origin: Secondary | ICD-10-CM | POA: Diagnosis not present

## 2022-12-15 DIAGNOSIS — Z940 Kidney transplant status: Secondary | ICD-10-CM | POA: Diagnosis not present

## 2022-12-15 DIAGNOSIS — D631 Anemia in chronic kidney disease: Secondary | ICD-10-CM | POA: Diagnosis not present

## 2022-12-15 DIAGNOSIS — E1129 Type 2 diabetes mellitus with other diabetic kidney complication: Secondary | ICD-10-CM | POA: Diagnosis not present

## 2022-12-15 DIAGNOSIS — R809 Proteinuria, unspecified: Secondary | ICD-10-CM | POA: Diagnosis not present

## 2022-12-16 DIAGNOSIS — M170 Bilateral primary osteoarthritis of knee: Secondary | ICD-10-CM | POA: Diagnosis not present

## 2022-12-16 DIAGNOSIS — G629 Polyneuropathy, unspecified: Secondary | ICD-10-CM | POA: Diagnosis not present

## 2022-12-16 DIAGNOSIS — Z9181 History of falling: Secondary | ICD-10-CM | POA: Diagnosis not present

## 2022-12-16 DIAGNOSIS — M5116 Intervertebral disc disorders with radiculopathy, lumbar region: Secondary | ICD-10-CM | POA: Diagnosis not present

## 2022-12-16 DIAGNOSIS — M542 Cervicalgia: Secondary | ICD-10-CM | POA: Diagnosis not present

## 2022-12-27 DIAGNOSIS — M25551 Pain in right hip: Secondary | ICD-10-CM | POA: Diagnosis not present

## 2022-12-27 DIAGNOSIS — M51362 Other intervertebral disc degeneration, lumbar region with discogenic back pain and lower extremity pain: Secondary | ICD-10-CM | POA: Diagnosis not present

## 2022-12-27 DIAGNOSIS — M51369 Other intervertebral disc degeneration, lumbar region without mention of lumbar back pain or lower extremity pain: Secondary | ICD-10-CM | POA: Diagnosis not present

## 2022-12-27 DIAGNOSIS — M48062 Spinal stenosis, lumbar region with neurogenic claudication: Secondary | ICD-10-CM | POA: Diagnosis not present

## 2023-01-02 DIAGNOSIS — E1129 Type 2 diabetes mellitus with other diabetic kidney complication: Secondary | ICD-10-CM | POA: Diagnosis not present

## 2023-01-02 DIAGNOSIS — Z794 Long term (current) use of insulin: Secondary | ICD-10-CM | POA: Diagnosis not present

## 2023-01-11 DIAGNOSIS — K635 Polyp of colon: Secondary | ICD-10-CM

## 2023-01-11 HISTORY — DX: Polyp of colon: K63.5

## 2023-01-25 NOTE — Progress Notes (Signed)
HPI  male former smoker followed for OSA, complicated by ESRD/Transplant, DM 2, GERD, CVA, chronic back pain Unattended Home Sleep Test 10/24/12-AHI 24/hour, desaturation to 81%. PFT 12/28/16-mild restriction, mild reduction of diffusion.  No response to bronchodilator.  FVC 3.28/70%, FEV1 2.64/75%, ratio 0.81, TLC 77%, DLCO 66%. -------------------------------------------------------------------------------   01/25/22- 73 year old male former smoker(20 pkyrs) followed for OSA, DOE complicated by ESRD/transplant, HTN, DM 2, GERD, CVA, chronic back pain, HTN, Aortic Aneurysm,  Pulmonary Hypertension, Covid pneumonia 2020, Anemia, CHF,  f/u OSA CPAP auto 5-20/ APS      AirSense 10 Auto Download- compliance 100%, AHI 3/ hr Body weight today- 251 lbs Covid vax- none Flu vax-had -----Pt doing well. No issues with cpap machine                            Wife here He is comfortable with CPAP. Download reviewed. Mask occasionally loses seal but not a problem and he still has full beard. Says transplanted kidney still doing well. Had bronchitis at Thanksgiving, resolved.  01/26/23- 73 year old male former smoker(20 pkyrs) followed for OSA, DOE complicated by ESRD/transplant, HTN, DM 2, GERD, CVA, chronic back pain, HTN, Aortic Aneurysm,  Pulmonary Hypertension, Covid pneumonia 2020, Anemia, CHF,  f/u OSA    (Wife does a lot of canning) CPAP auto 5-20/ APS      AirSense 11 Auto   new 2024 Download- compliance 100%, AHI 2.1/hr Body weight today- 259 lbs Reservoir runs dry by [employer]  Discussed the use of AI scribe software for clinical note transcription with the patient, who gave verbal consent to proceed.  History of Present Illness   The patient, with a history of sleep apnea, presents for a follow-up visit. He has been self-adjusting his CPAP machine, using a large face mask from his old machine due to discomfort with the medium mask provided with the new machine. He reports that the humidifier  setting on the new machine is too high, causing it to run dry by morning. He plans to adjust the humidifier setting and order a large mask for the new machine. Despite these issues, he feels rested during the day and has not noticed any other changes or concerns with his health. He also mentions a recent fall, but does not elaborate on any injuries or complications from the fall.     ROS-see HPI   + = positive Constitutional:    weight loss, night sweats, fevers, chills, fatigue, lassitude. HEENT:    headaches, difficulty swallowing, tooth/dental problems, sore throat,       sneezing, itching, ear ache, nasal congestion, post nasal drip, snoring CV:    chest pain, orthopnea, PND, swelling in lower extremities, anasarca,                                                dizziness, palpitations Resp:   shortness of breath with exertion or at rest.                productive cough,   non-productive cough, coughing up of blood.              change in color of mucus.  wheezing.   Skin:    rash or lesions. GI:  No-   heartburn, indigestion, abdominal pain, nausea, vomiting, diarrhea,  change in bowel habits, loss of appetite GU: dysuria, change in color of urine, no urgency or frequency.   flank pain. MS:   joint pain, stiffness, decreased range of motion, back pain. Neuro-     nothing unusual Psych:  change in mood or affect.  depression or anxiety.   memory loss.  OBJ- Physical Exam General- Alert, Oriented, Affect-appropriate, Distress- none acute, +overweight, +full beard Skin-  Lymphadenopathy- none Head- atraumatic            Eyes- Gross vision intact, PERRLA, conjunctivae and secretions clear            Ears- Hearing, canals-normal            Nose- Clear, no-Septal dev, mucus, polyps, erosion, perforation             Throat- Mallampati IV , mucosa clear , drainage- none, tonsils- atrophic, + full dentures Neck- flexible , trachea midline, no stridor , thyroid nl, carotid no  bruit Chest - symmetrical excursion , unlabored           Heart/CV- RRR , murmur+ 1/6 S Ao , no gallop  , no rub, nl s1 s2                           - JVD- none , edema- none, stasis changes- none, varices- none           Lung- clear to P&A, wheeze- none, cough- none , dullness-none, rub- none           Chest wall- + loop recorder Abd-  Br/ Gen/ Rectal- Not done, not indicated Extrem- cyanosis- none, clubbing, none, atrophy- none, strength- nl Neuro- grossly intact to observation  Assessment and Plan    Obstructive Sleep Apnea Patient reports issues with CPAP mask fit and humidifier settings. Patient has been using an older large mask from a previous machine and plans to order a new large mask. Humidifier settings may be too high, causing discomfort. -Encourage patient to order a large mask for better fit. -Advise patient to adjust humidifier settings as per the machine's manual for comfort. -If issues persist, patient should contact the home care company for further assistance.  Fall Risk Patient reported a recent fall. No injuries reported. -Encourage patient to maintain safety measures to prevent future falls.  Heart Murmur No changes reported. -Continue current management plan.

## 2023-01-26 ENCOUNTER — Encounter: Payer: Self-pay | Admitting: Internal Medicine

## 2023-01-26 ENCOUNTER — Ambulatory Visit: Payer: Medicare Other | Admitting: Internal Medicine

## 2023-01-26 VITALS — BP 146/68 | HR 95 | Temp 97.5°F | Ht 71.0 in | Wt 259.2 lb

## 2023-01-26 DIAGNOSIS — G4733 Obstructive sleep apnea (adult) (pediatric): Secondary | ICD-10-CM | POA: Diagnosis not present

## 2023-01-26 NOTE — Patient Instructions (Signed)
You can adjust your humidifier down a little so it doesn't run dry.  We can leave it set on autopap range 5-20.

## 2023-03-14 DIAGNOSIS — M109 Gout, unspecified: Secondary | ICD-10-CM | POA: Diagnosis not present

## 2023-03-14 DIAGNOSIS — E1129 Type 2 diabetes mellitus with other diabetic kidney complication: Secondary | ICD-10-CM | POA: Diagnosis not present

## 2023-03-14 DIAGNOSIS — B348 Other viral infections of unspecified site: Secondary | ICD-10-CM | POA: Diagnosis not present

## 2023-03-14 DIAGNOSIS — R809 Proteinuria, unspecified: Secondary | ICD-10-CM | POA: Diagnosis not present

## 2023-03-14 DIAGNOSIS — I10 Essential (primary) hypertension: Secondary | ICD-10-CM | POA: Diagnosis not present

## 2023-03-14 DIAGNOSIS — R7989 Other specified abnormal findings of blood chemistry: Secondary | ICD-10-CM | POA: Diagnosis not present

## 2023-03-14 DIAGNOSIS — N2581 Secondary hyperparathyroidism of renal origin: Secondary | ICD-10-CM | POA: Diagnosis not present

## 2023-03-14 DIAGNOSIS — Z940 Kidney transplant status: Secondary | ICD-10-CM | POA: Diagnosis not present

## 2023-03-14 DIAGNOSIS — D631 Anemia in chronic kidney disease: Secondary | ICD-10-CM | POA: Diagnosis not present

## 2023-03-14 DIAGNOSIS — N189 Chronic kidney disease, unspecified: Secondary | ICD-10-CM | POA: Diagnosis not present

## 2023-03-14 DIAGNOSIS — K922 Gastrointestinal hemorrhage, unspecified: Secondary | ICD-10-CM | POA: Diagnosis not present

## 2023-03-16 DIAGNOSIS — Z9181 History of falling: Secondary | ICD-10-CM | POA: Diagnosis not present

## 2023-03-16 DIAGNOSIS — M5116 Intervertebral disc disorders with radiculopathy, lumbar region: Secondary | ICD-10-CM | POA: Diagnosis not present

## 2023-03-16 DIAGNOSIS — M170 Bilateral primary osteoarthritis of knee: Secondary | ICD-10-CM | POA: Diagnosis not present

## 2023-03-16 DIAGNOSIS — M542 Cervicalgia: Secondary | ICD-10-CM | POA: Diagnosis not present

## 2023-03-16 DIAGNOSIS — G629 Polyneuropathy, unspecified: Secondary | ICD-10-CM | POA: Diagnosis not present

## 2023-03-28 DIAGNOSIS — M25551 Pain in right hip: Secondary | ICD-10-CM | POA: Diagnosis not present

## 2023-03-28 DIAGNOSIS — M48062 Spinal stenosis, lumbar region with neurogenic claudication: Secondary | ICD-10-CM | POA: Diagnosis not present

## 2023-03-31 ENCOUNTER — Encounter (HOSPITAL_COMMUNITY): Payer: Self-pay

## 2023-03-31 ENCOUNTER — Emergency Department (HOSPITAL_COMMUNITY)

## 2023-03-31 ENCOUNTER — Emergency Department (HOSPITAL_COMMUNITY)
Admission: EM | Admit: 2023-03-31 | Discharge: 2023-03-31 | Disposition: A | Discharge status: Home / Self Care | Attending: Emergency Medicine | Admitting: Emergency Medicine

## 2023-03-31 ENCOUNTER — Other Ambulatory Visit: Payer: Self-pay

## 2023-03-31 DIAGNOSIS — E119 Type 2 diabetes mellitus without complications: Secondary | ICD-10-CM | POA: Diagnosis not present

## 2023-03-31 DIAGNOSIS — R112 Nausea with vomiting, unspecified: Secondary | ICD-10-CM | POA: Diagnosis not present

## 2023-03-31 DIAGNOSIS — Z7982 Long term (current) use of aspirin: Secondary | ICD-10-CM | POA: Insufficient documentation

## 2023-03-31 DIAGNOSIS — Z79899 Other long term (current) drug therapy: Secondary | ICD-10-CM | POA: Insufficient documentation

## 2023-03-31 DIAGNOSIS — I509 Heart failure, unspecified: Secondary | ICD-10-CM | POA: Diagnosis not present

## 2023-03-31 DIAGNOSIS — Z794 Long term (current) use of insulin: Secondary | ICD-10-CM | POA: Insufficient documentation

## 2023-03-31 DIAGNOSIS — R197 Diarrhea, unspecified: Secondary | ICD-10-CM | POA: Insufficient documentation

## 2023-03-31 DIAGNOSIS — R1110 Vomiting, unspecified: Secondary | ICD-10-CM | POA: Insufficient documentation

## 2023-03-31 DIAGNOSIS — R110 Nausea: Secondary | ICD-10-CM | POA: Diagnosis not present

## 2023-03-31 DIAGNOSIS — I10 Essential (primary) hypertension: Secondary | ICD-10-CM | POA: Diagnosis not present

## 2023-03-31 DIAGNOSIS — R109 Unspecified abdominal pain: Secondary | ICD-10-CM | POA: Diagnosis not present

## 2023-03-31 DIAGNOSIS — N189 Chronic kidney disease, unspecified: Secondary | ICD-10-CM | POA: Insufficient documentation

## 2023-03-31 DIAGNOSIS — K591 Functional diarrhea: Secondary | ICD-10-CM | POA: Diagnosis not present

## 2023-03-31 DIAGNOSIS — I130 Hypertensive heart and chronic kidney disease with heart failure and stage 1 through stage 4 chronic kidney disease, or unspecified chronic kidney disease: Secondary | ICD-10-CM | POA: Diagnosis not present

## 2023-03-31 DIAGNOSIS — R188 Other ascites: Secondary | ICD-10-CM | POA: Diagnosis not present

## 2023-03-31 LAB — COMPREHENSIVE METABOLIC PANEL
ALT: 60 U/L — ABNORMAL HIGH (ref 0–44)
AST: 73 U/L — ABNORMAL HIGH (ref 15–41)
Albumin: 3.4 g/dL — ABNORMAL LOW (ref 3.5–5.0)
Alkaline Phosphatase: 109 U/L (ref 38–126)
Anion gap: 6 (ref 5–15)
BUN: 26 mg/dL — ABNORMAL HIGH (ref 8–23)
CO2: 23 mmol/L (ref 22–32)
Calcium: 9.1 mg/dL (ref 8.9–10.3)
Chloride: 112 mmol/L — ABNORMAL HIGH (ref 98–111)
Creatinine, Ser: 1.89 mg/dL — ABNORMAL HIGH (ref 0.61–1.24)
GFR, Estimated: 37 mL/min — ABNORMAL LOW (ref >60)
Glucose, Bld: 157 mg/dL — ABNORMAL HIGH (ref 70–99)
Potassium: 5.5 mmol/L — ABNORMAL HIGH (ref 3.5–5.1)
Sodium: 141 mmol/L (ref 135–145)
Total Bilirubin: 0.8 mg/dL (ref 0.0–1.2)
Total Protein: 6.5 g/dL (ref 6.5–8.1)

## 2023-03-31 LAB — CBC
HCT: 41.6 % (ref 39.0–52.0)
Hemoglobin: 13.3 g/dL (ref 13.0–17.0)
MCH: 31.1 pg (ref 26.0–34.0)
MCHC: 32 g/dL (ref 30.0–36.0)
MCV: 97.2 fL (ref 80.0–100.0)
Platelets: 110 10*3/uL — ABNORMAL LOW (ref 150–400)
RBC: 4.28 MIL/uL (ref 4.22–5.81)
RDW: 13.7 % (ref 11.5–15.5)
WBC: 7.2 10*3/uL (ref 4.0–10.5)
nRBC: 0 % (ref 0.0–0.2)

## 2023-03-31 LAB — URINALYSIS, ROUTINE W REFLEX MICROSCOPIC
Bilirubin Urine: NEGATIVE
Glucose, UA: NEGATIVE mg/dL
Ketones, ur: NEGATIVE mg/dL
Leukocytes,Ua: NEGATIVE
Nitrite: NEGATIVE
Protein, ur: 100 mg/dL — AB
Specific Gravity, Urine: 1.02 (ref 1.005–1.030)
pH: 7 (ref 5.0–8.0)

## 2023-03-31 LAB — URINALYSIS, MICROSCOPIC (REFLEX): WBC, UA: NONE SEEN WBC/hpf (ref 0–5)

## 2023-03-31 LAB — LIPASE, BLOOD: Lipase: 22 U/L (ref 11–51)

## 2023-03-31 MED ORDER — AMLODIPINE BESYLATE 5 MG PO TABS
5.0000 mg | ORAL_TABLET | Freq: Once | ORAL | Status: AC
  Administered 2023-03-31: 5 mg via ORAL
  Filled 2023-03-31: qty 1

## 2023-03-31 MED ORDER — MORPHINE SULFATE (PF) 4 MG/ML IV SOLN
4.0000 mg | Freq: Once | INTRAVENOUS | Status: AC
  Administered 2023-03-31: 4 mg via INTRAVENOUS
  Filled 2023-03-31: qty 1

## 2023-03-31 MED ORDER — ONDANSETRON HCL 4 MG/2ML IJ SOLN
4.0000 mg | Freq: Once | INTRAMUSCULAR | Status: AC
  Administered 2023-03-31: 4 mg via INTRAVENOUS
  Filled 2023-03-31: qty 2

## 2023-03-31 MED ORDER — LACTATED RINGERS IV BOLUS
1000.0000 mL | Freq: Once | INTRAVENOUS | Status: AC
  Administered 2023-03-31: 1000 mL via INTRAVENOUS

## 2023-03-31 MED ORDER — SODIUM CHLORIDE 0.9 % IV SOLN
12.5000 mg | Freq: Once | INTRAVENOUS | Status: AC
  Administered 2023-03-31: 12.5 mg via INTRAVENOUS
  Filled 2023-03-31: qty 12.5

## 2023-03-31 MED ORDER — ONDANSETRON HCL 8 MG PO TABS: 8.0000 mg | ORAL_TABLET | Freq: Three times a day (TID) | ORAL | 0 refills | Status: AC | PRN

## 2023-03-31 NOTE — ED Triage Notes (Signed)
 Pt c/o epigastric pain, N/V/D started at 0530 today.

## 2023-03-31 NOTE — Discharge Instructions (Addendum)
 Take the medications as needed for nausea and vomiting.  Follow-up with your primary care or kidney doctor to have your electrolyte panel rechecked.  Return to the ER for worsening symptoms, recurrent nausea and vomiting.

## 2023-03-31 NOTE — ED Provider Triage Note (Signed)
 Emergency Medicine Provider Triage Evaluation Note  Jon [name] , a 73 y.o. male  was evaluated in triage.  Pt complains of abd cramping and nvd since last evening/early AM. No known bad food ingestion or ill contacts. No dysuria. No fevers.  Review of Systems  Positive: nvd Negative: Fever. No chest pain. No sob.   Physical Exam  BP (!) 173/74   Pulse 69   Temp 97.6 F (36.4 C) (Oral)   Resp 16   Ht 1.803 m (5\' 11" )   Wt 117.6 kg   SpO2 100%   BMI 36.16 kg/m  Gen:   Awake, no distress   Resp:  Normal effort  MSK:   Moves extremities without difficulty  Abd: soft, mildly distended. Tender diffusely/mild. No peritonitis.   Medical Decision Making  Medically screening exam initiated at 4:02 PM.  Appropriate orders placed.  Jon [name] was informed that the remainder of the evaluation will be completed by another provider, this initial triage assessment does not replace that evaluation, and the importance of remaining in the ED until their evaluation is complete.  Labs ordered. Ivf. Zofran.    Cathren Laine, MD 03/31/23 [phone]

## 2023-03-31 NOTE — ED Provider Notes (Signed)
 [place] EMERGENCY DEPARTMENT AT [facility] Provider Note   CSN: [id number] Arrival date & time: 03/31/23  1537     History  Chief Complaint  Patient presents with   Abdominal Pain   Emesis    Jon [name] is a 73 y.o. male.   Abdominal Pain Associated symptoms: vomiting   Emesis Associated symptoms: abdominal pain      Patient has a history of hyperlipidemia ,chronic back pain, hiatal hernia, peripheral edema, diabetes, cerebral infarction, acid reflux ,congestive heart failure ,chronic kidney disease s/p transplant.  Patient presents to the ED with complaints of vomiting and diarrhea.  Patient states he started having these symptoms last evening early this morning.  He has had several episodes of vomiting as well as diarrhea.  Patient states the vomiting was yellow and then even turned green.  Has been having some pain in his upper abdomen.  His only prior abdominal surgery was a kidney transplant.  No recent ill contacts.  No recent antibiotics  Home Medications Prior to Admission medications   Medication Sig Start Date End Date Taking? Authorizing Provider  amLODipine (NORVASC) 10 MG tablet Take 10 mg by mouth daily. 12/16/19   [provider]  aspirin 81 MG EC tablet Take 81 mg by mouth daily. Swallow whole.    [provider]  atorvastatin (LIPITOR) 40 MG tablet Take 40 mg by mouth at bedtime. 12/18/19   [provider]  calcitRIOL (ROCALTROL) 0.25 MCG capsule Take 0.25 mcg by mouth every Monday, Tuesday, Wednesday, Thursday, and Friday.    [provider]  cholecalciferol (VITAMIN D3) 25 MCG (1000 UNIT) tablet Take 1,000 Units by mouth daily.    [provider]  colchicine 0.6 MG tablet Take 0.6 mg by mouth 2 (two) times daily. 09/29/21   [provider]  febuxostat (ULORIC) 40 MG tablet Take 1 tablet by mouth daily. 08/03/21   [provider]  furosemide (LASIX) 40 MG tablet Take 80 mg by  mouth in the morning. 1 every afternoon 06/09/21   [provider]  gabapentin (NEURONTIN) 300 MG capsule Take 300 mg by mouth 3 (three) times daily.    [provider]  HYDROcodone-acetaminophen (NORCO) 10-325 MG tablet Take 1-2 tablets by mouth every 4 (four) hours as needed for moderate pain. 04/09/20   Val Eagle D, NP  Insulin Glargine (BASAGLAR KWIKPEN) 100 UNIT/ML Inject 49 Units into the skin at bedtime.    [provider]  insulin lispro (HUMALOG KWIKPEN) 100 UNIT/ML KwikPen Inject 5 Units into the skin See admin instructions. Sliding scale over 150 add 2 units Three times a day    [provider]  labetalol (NORMODYNE) 100 MG tablet Take 100 mg by mouth 2 (two) times daily. 07/28/20   [provider]  methimazole (TAPAZOLE) 5 MG tablet Take 2.5 mg by mouth daily.  06/29/17   [provider]  Multiple Vitamin (MULTIVITAMIN WITH MINERALS) TABS tablet Take 1 tablet by mouth daily. Unknown strength    [provider]  mycophenolate (MYFORTIC) 180 MG EC tablet Take 180 mg by mouth 2 (two) times daily.     [provider]  [facility] ULTRA test strip 1 each by Other route as needed for other (Glucose check). 07/09/20   [provider]  pantoprazole (PROTONIX) 40 MG tablet Take 1 tablet (40 mg total) by mouth 2 (two) times daily before a meal. 01/27/19   Calvert Cantor, MD  predniSONE (DELTASONE) 5 MG tablet Take  1 tablet (5 mg total) by mouth daily with breakfast. 01/17/19   Rodolph Bong, MD  [facility] Wort 300 MG CAPS Take 300 mg by mouth 3 (three) times daily.    [provider]  sulfamethoxazole-trimethoprim (BACTRIM) 400-80 MG tablet Take 1 tablet by mouth every Monday, Wednesday, and Friday. 02/18/19   [provider]  tacrolimus (PROGRAF) 1 MG capsule Take 2 mg by mouth 2 (two) times daily. 06/28/17   [provider]  tamsulosin (FLOMAX) 0.4 MG CAPS capsule Take 0.4 mg by mouth at bedtime.     [provider]  TRADJENTA 5 MG TABS tablet Take 5 mg by mouth daily. 04/17/18   [provider]  vitamin B-12 (CYANOCOBALAMIN) 1000 MCG tablet Take 1,000 mcg by mouth 2 (two) times daily.     [provider]  vitamin C (ASCORBIC ACID) 500 MG tablet Take 500 mg by mouth daily.    [provider]  Zinc 30 MG CAPS Take 30 mg by mouth 2 (two) times daily.    [provider]      Allergies    Lisinopril, Meloxicam, Victoza [liraglutide], and Cyclobenzaprine    Review of Systems   Review of Systems  Gastrointestinal:  Positive for abdominal pain and vomiting.    Physical Exam Updated Vital Signs BP (!) 194/79   Pulse 65   Temp 97.6 F (36.4 C)   Resp 18   Ht 1.803 m (5\' 11" )   Wt 117.6 kg   SpO2 96%   BMI 36.16 kg/m  Physical Exam Vitals and nursing note reviewed.  Constitutional:      Appearance: He is well-developed. He is ill-appearing.  HENT:     Head: Normocephalic and atraumatic.     Right Ear: External ear normal.     Left Ear: External ear normal.  Eyes:     General: No scleral icterus.       Right eye: No discharge.        Left eye: No discharge.     Conjunctiva/sclera: Conjunctivae normal.  Neck:     Trachea: No tracheal deviation.  Cardiovascular:     Rate and Rhythm: Normal rate and regular rhythm.  Pulmonary:     Effort: Pulmonary effort is normal. No respiratory distress.     Breath sounds: Normal breath sounds. No stridor. No wheezing or rales.  Abdominal:     General: Bowel sounds are normal. There is no distension.     Palpations: Abdomen is soft.     Tenderness: There is abdominal tenderness in the epigastric area. There is no guarding or rebound.  Genitourinary:    Testes:        Right: Swelling not present.        Left: Swelling not present.  Musculoskeletal:        General: No tenderness or deformity.     Cervical back: Neck supple.     Right lower leg: Edema present.     Left lower leg: Edema  present.  Skin:    General: Skin is warm and dry.     Findings: No rash.  Neurological:     General: No focal deficit present.     Mental Status: He is alert.     Cranial Nerves: No cranial nerve deficit, dysarthria or facial asymmetry.     Sensory: No sensory deficit.     Motor: No abnormal muscle tone or seizure activity.     Coordination: Coordination normal.  Psychiatric:  Mood and Affect: Mood normal.     ED Results / Procedures / Treatments   Labs (all labs ordered are listed, but only abnormal results are displayed) Labs Reviewed  CBC - Abnormal; Notable for the following components:      Result Value   Platelets 110 (*)    All other components within normal limits  COMPREHENSIVE METABOLIC PANEL - Abnormal; Notable for the following components:   Potassium 5.5 (*)    Chloride 112 (*)    Glucose, Bld 157 (*)    BUN 26 (*)    Creatinine, Ser 1.89 (*)    Albumin 3.4 (*)    AST 73 (*)    ALT 60 (*)    GFR, Estimated 37 (*)    All other components within normal limits  URINALYSIS, ROUTINE W REFLEX MICROSCOPIC - Abnormal; Notable for the following components:   Hgb urine dipstick TRACE (*)    Protein, ur 100 (*)    All other components within normal limits  URINALYSIS, MICROSCOPIC (REFLEX) - Abnormal; Notable for the following components:   Bacteria, UA RARE (*)    All other components within normal limits  LIPASE, BLOOD    EKG None  Radiology CT ABDOMEN PELVIS WO CONTRAST Result Date: 03/31/2023 CLINICAL DATA:  Abdominal pain and vomiting, initial encounter EXAM: CT ABDOMEN AND PELVIS WITHOUT CONTRAST TECHNIQUE: Multidetector CT imaging of the abdomen and pelvis was performed following the standard protocol without IV contrast. RADIATION DOSE REDUCTION: This exam was performed according to the departmental dose-optimization program which includes automated exposure control, adjustment of the mA and/or kV according to patient size and/or use of iterative  reconstruction technique. COMPARISON:  06/18/2017 FINDINGS: Lower chest: No acute abnormality. Hepatobiliary: No focal liver abnormality is seen. No gallstones, gallbladder wall thickening, or biliary dilatation. Pancreas: Unremarkable. No pancreatic ductal dilatation or surrounding inflammatory changes. Spleen: Normal in size without focal abnormality. Adrenals/Urinary Tract: Adrenal glands are within normal limits. Kidneys are shrunken consistent with end-stage renal disease. A right pelvic transplant kidney is seen without complication. It measures approximately 10.2 cm. No hydronephrosis is noted. The native bladder is decompressed. Stomach/Bowel: Colon is predominately decompressed. The appendix is within normal limits. Small bowel is unremarkable. Stomach is within limits. Vascular/Lymphatic: Aortic atherosclerosis. No enlarged abdominal or pelvic lymph nodes. Reproductive: Prostate is unremarkable. Other: Minimal free fluid is noted within the abdomen. A fluid collection is noted along the right anterior abdominal wall measuring 11.3 x 4.0 cm in greatest dimension. This is stable from the prior study. Musculoskeletal: Postsurgical changes are noted in the proximal right femur as well as in the lower lumbar spine. IMPRESSION: No acute abnormality noted. Stable right renal transplant. Stable postoperative fluid collection in the right anterolateral abdominal wall. Electronically Signed   By: Alcide Clever M.D.   On: 03/31/2023 19:58    Procedures Procedures    Medications Ordered in ED Medications  amLODipine (NORVASC) tablet 5 mg (has no administration in time range)  lactated ringers bolus 1,000 mL (1,000 mLs Intravenous New Bag/Given 03/31/23 1847)  ondansetron (ZOFRAN) injection 4 mg (4 mg Intravenous Given 03/31/23 1838)  promethazine (PHENERGAN) 12.5 mg in sodium chloride 0.9 % 50 mL IVPB (0 mg Intravenous Stopped 03/31/23 2035)  morphine (PF) 4 MG/ML injection 4 mg (4 mg Intravenous Given  03/31/23 1844)  morphine (PF) 4 MG/ML injection 4 mg (4 mg Intravenous Given 03/31/23 2047)    ED Course/ Medical Decision Making/ A&P Clinical Course as of 03/31/23 2253  Fri  Mar 31, 2023  1819 CBC normal.  Metabolic panel shows increased BUN and creatinine as well as potassium.  Creatinine is decreased compared to previous values.  Lipase is normal. [JK]  2045 CT scan does not show any acute abnormality.  Patient is stable postoperative fluid collection of the right anterior lateral abdominal wall [JK]  2230 Urinalysis not suggestive of infection [JK]    Clinical Course User Index [JK] Linwood Dibbles, MD                                 Medical Decision Making Problems Addressed: Hypertension, unspecified type: chronic illness or injury Vomiting and diarrhea: acute illness or injury that poses a threat to life or bodily functions  Risk Prescription drug management.   Patient presented to the ED with complaints of vomiting diarrhea discomfort.  While the patient was in the ED his wife remembered that a grandchild who recently had norovirus did visit in the past week.  Patient's ED workup showed slight elevation in his potassium however I do not think that is clinically significant.  His creatinine is decreased compared to his baseline.  No signs of acute renal failure or complications associated with his renal transplant.  No signs of urinary tract infection.  His CBC is unremarkable.  CT scan was performed and it does not show any evidence of acute abnormality.  Patient improved in the ED with IV nausea medications.  He was also given doses of pain medications.  Patient is on chronic opiates for his back.  Patient has tolerated p.o. fluids.  No recurrent vomiting and diarrhea.  Will give him a dose of his oral blood pressure medication that he was not able to take today.  Recommend follow-up with his nephrologist to have his renal function rechecked        Final Clinical  Impression(s) / ED Diagnoses Final diagnoses:  Vomiting and diarrhea  Hypertension, unspecified type    Rx / DC Orders ED Discharge Orders     None         Linwood Dibbles, MD 03/31/23 2255

## 2023-04-05 ENCOUNTER — Ambulatory Visit: Payer: Medicare Other | Attending: Cardiology | Admitting: Cardiology

## 2023-04-05 ENCOUNTER — Encounter: Payer: Self-pay | Admitting: Cardiology

## 2023-04-05 VITALS — BP 150/88 | HR 91 | Ht 71.0 in | Wt 254.2 lb

## 2023-04-05 DIAGNOSIS — I272 Pulmonary hypertension, unspecified: Secondary | ICD-10-CM | POA: Diagnosis not present

## 2023-04-05 DIAGNOSIS — D849 Immunodeficiency, unspecified: Secondary | ICD-10-CM | POA: Diagnosis not present

## 2023-04-05 DIAGNOSIS — I10 Essential (primary) hypertension: Secondary | ICD-10-CM

## 2023-04-05 DIAGNOSIS — I7121 Aneurysm of the ascending aorta, without rupture: Secondary | ICD-10-CM

## 2023-04-05 MED ORDER — LABETALOL HCL 100 MG PO TABS: 50.0000 mg | ORAL_TABLET | Freq: Two times a day (BID) | ORAL | 3 refills | Status: AC

## 2023-04-05 NOTE — Progress Notes (Signed)
 Cardiology Office Note:    Date:  04/05/2023   ID:  Jon [name], Jon [name] 17-Oct-1950, MRN [id number]  PCP:  Soundra Pilon, FNP  Cardiologist:  Gypsy Balsam, MD    Referring MD: Noni Saupe, MD   Chief Complaint  Patient presents with   Follow-up    History of Present Illness:    Jon [name] is a 73 y.o. male past medical history significant for kidney transplant that he received from his sister 7 years ago, ascending aortic aneurysm measuring 40 mm, essential hypertension, diabetes, pulmonary hypertension, obstructive sleep apnea.  Comes today to months for follow-up.  Cardiac wise doing well he described the fact that he have some GI issues with diarrhea couple days ago but that is stable now.  Denies have any chest pain tightness squeezing pressure burning chest some fatigue and shortness of breath is there  Past Medical History:  Diagnosis Date   Abnormal gait    Acute blood loss anemia    Acute renal failure superimposed on stage 4 chronic kidney disease (HCC) 06/04/2017   Acute respiratory failure with hypoxia (HCC) 01/04/2019   Covid pneumonia Dec, 2020   Acute right ankle pain 12/08/2016   AKI (acute kidney injury) (HCC) 12/27/2018   Anemia of chronic disease    Arteriovenous fistula for hemodialysis in place, primary [facility]) 08/08/2017   Overview:  Left upper extremity  Formatting of this note might be different from the original. Left upper extremity   Arthritis    back, hands    Ascending aortic aneurysm (HCC) 10/23/2017   3.9 cm by echocardiogram   Backache 01/27/2011   BK viremia 02/07/2014   Body mass index (BMI) 36.0-36.9, adult 01/16/2020   Cerebral infarction (HCC) 08/08/2017   Cerebrovascular accident (CVA) (HCC)    Cervical spondylosis 12/27/2012   Chronic back pain    radiculopathy and stenosis   Chronic ischemic heart disease    Severe LVH   Chronic kidney disease 2015   transplant   Congestive heart failure (CHF) (HCC)    COVID-19  virus infection    CVA (cerebral infarction) 03/2013   Diabetes mellitus     Lantus nightly ;type 2   Diabetes mellitus (HCC) 01/23/2011   Formatting of this note might be different from the original. Currently on Insulin   Digital mucinous cyst of finger of left hand 10/10/2016   Duodenal ulcer hemorrhagic    Dysphagia 08/22/2017   Dyspnea    Dyspnea on exertion 08/24/2016   PFT 12/28/16-mild restriction, mild reduction of diffusion.  No response to bronchodilator.  FVC 3.28/70%, FEV1 2.64/75%, ratio 0.81, TLC 77%, DLCO 66%.   Elevated blood-pressure reading, without diagnosis of hypertension 02/25/2019   Elevated LFTs    Elevated serum creatinine 10/24/2013   Essential (primary) hypertension 11/27/2018   takes Amlodipine and Catapres daily    dr Lance Morin, Loop [facility] - [place]   Finger infection, fungal 11/22/2016   Fungal intestinal infection following organ transplantation 11/29/2016   GERD (gastroesophageal reflux disease)    GI bleed    5 units oblood    H/O hiatal hernia    Heart murmur    History of blood transfusion    History of colon polyps    History of kidney stones    passed   History of kidney transplant 01/24/2011   S/P renal transplant in 2015 at [facility]- followed by Dr Kathrene Bongo at [facility]   History of stroke 03/16/2013   HLD (hyperlipidemia) 05/02/2013  HTN (hypertension) 01/27/2011   Hx of cardiovascular stress test    a.  Lexiscan Myoview (05/2013):  No ischemia, EF 53%; Normal Study   Hyperkalemia 01/23/2011   Hyperlipidemia    takes Lovastatin daily   Hypertension    takes Amlodipine and Catapres daily    dr Lance Morin, Loop Recorder - [facility]   Hypertension associated with transplantation 12/03/2014   Hypomagnesemia 10/25/2013   Immunosuppression (HCC) 10/14/2013   Immunosuppressive management encounter following kidney transplant 10/21/2013   Insulin dependent type 2 diabetes mellitus (HCC) 01/28/2014   Overview:  Currently on Insulin    Lumbago with sciatica, right side 12/17/2019   Lumbar post-laminectomy syndrome 05/13/2015   Mass of soft tissue of left upper extremity 10/18/2016   Melena    Movement disorder 03/16/2013   Nocturia    Obesity    OSA (obstructive sleep apnea) 05/02/2013   Unattended Home Sleep Test 10/24/12-AHI 24/hour, desaturation to 81%.   Osteoarthritis (arthritis due to wear and tear of joints) 08/08/2017   Other chronic pain 12/17/2019   Pain at surgical site 10/01/2013   Peripheral edema    takes Lasix daily   Personal history of COVID-19    Pneumonia 2021   Pneumonia due to COVID-19 virus    Preop cardiovascular exam 03/22/2013   Pulmonary hypertension, unspecified (HCC) 01/23/2017   Scapholunate dissociation of left wrist 10/18/2016   Sleep apnea    cpap , study in their home, 09/2102- [employer]            Spinal stenosis 01/16/2020   Formatting of this note might be different from the original. Operated twice with sciatica with disectomy of lumbars   Stroke (HCC) 2015   speech, rt arm weakness- 04/07/20 - no residual    Swelling of lower extremity 06/30/2016   Upper GI bleed 01/20/2019   URI, acute 03/07/2014   Urinary frequency     Past Surgical History:  Procedure Laterality Date   AV FISTULA PLACEMENT Left 04/03/2013   Procedure: ARTERIOVENOUS (AV) FISTULA CREATION- LEFT RADIOCEPHALIC VS BRACHIOCEPHALIC;  Surgeon: Fransisco Hertz, MD;  Location: [facility] OR;  Service: Vascular;  Laterality: Left;   AV FISTULA PLACEMENT Left 05/14/2013   Procedure: LEFT BRACHIOCEPHALIC ARTERIOVENOUS (AV) FISTULA CREATION;  Surgeon: Fransisco Hertz, MD;  Location: [facility] OR;  Service: Vascular;  Laterality: Left;   BACK SURGERY  12/2012   2003- 1st back surgery & then 2014- fusion   COLONOSCOPY     ESOPHAGOGASTRODUODENOSCOPY     ESOPHAGOGASTRODUODENOSCOPY (EGD) WITH PROPOFOL N/A 01/22/2019   Procedure: ESOPHAGOGASTRODUODENOSCOPY (EGD) WITH PROPOFOL;  Surgeon: Beverley Fiedler, MD;  Location: [facility] ENDOSCOPY;  Service: Gastroenterology;   Laterality: N/A;   HEMORRHOID SURGERY     HEMOSTASIS CLIP PLACEMENT  01/22/2019   Procedure: HEMOSTASIS CLIP PLACEMENT;  Surgeon: Beverley Fiedler, MD;  Location: [facility] ENDOSCOPY;  Service: Gastroenterology;;   KIDNEY TRANSPLANT  10/01/2013   left knee surgery     LOOP RECORDER IMPLANT  03/19/13   MDT LinQ implanted by Dr Johney Frame for cryptogenic stroke   LOOP RECORDER IMPLANT N/A 03/19/2013   Procedure: LOOP RECORDER IMPLANT;  Surgeon: Gardiner Rhyme, MD;  Location: [facility] CATH LAB;  Service: Cardiovascular;  Laterality: N/A;   LUMBAR FUSION  04/08/2020   Dr. Tressie Stalker   NEPHRECTOMY TRANSPLANTED ORGAN     NEUROPLASTY / TRANSPOSITION MEDIAN NERVE AT CARPAL TUNNEL BILATERAL     right leg surgery     pin in place   right wrist surgery  SCLEROTHERAPY  01/22/2019   Procedure: Susa Day;  Surgeon: Beverley Fiedler, MD;  Location: [facility] ENDOSCOPY;  Service: Gastroenterology;;   TEE WITHOUT CARDIOVERSION N/A 03/19/2013   Procedure: TRANSESOPHAGEAL ECHOCARDIOGRAM (TEE);  Surgeon: Lewayne Bunting, MD;  Location: [facility] ENDOSCOPY;  Service: Cardiovascular;  Laterality: N/A;   THYROID SURGERY  10/13/2015   Performed at [facility] with surgical pathology revealed consistent with benign follicular nodule ([name] category II)    Current Medications: Current Meds  Medication Sig   amLODipine (NORVASC) 10 MG tablet Take 10 mg by mouth daily.   aspirin 81 MG EC tablet Take 81 mg by mouth daily. Swallow whole.   calcitRIOL (ROCALTROL) 0.25 MCG capsule Take 0.25 mcg by mouth every Monday, Tuesday, Wednesday, Thursday, and Friday.   cholecalciferol (VITAMIN D3) 25 MCG (1000 UNIT) tablet Take 1,000 Units by mouth daily.   colchicine 0.6 MG tablet Take 0.6 mg by mouth 2 (two) times daily.   febuxostat (ULORIC) 40 MG tablet Take 1 tablet by mouth daily.   ferrous sulfate 325 (65 FE) MG EC tablet Take 325 mg by mouth daily with breakfast.   furosemide (LASIX) 40 MG tablet Take 80 mg by mouth in the morning. 1 every  afternoon   gabapentin (NEURONTIN) 300 MG capsule Take 300 mg by mouth 3 (three) times daily.   HYDROcodone-acetaminophen (NORCO) 10-325 MG tablet Take 1-2 tablets by mouth every 4 (four) hours as needed for moderate pain.   Insulin Glargine (BASAGLAR KWIKPEN) 100 UNIT/ML Inject 49 Units into the skin at bedtime.   insulin lispro (HUMALOG KWIKPEN) 100 UNIT/ML KwikPen Inject 5 Units into the skin See admin instructions. Sliding scale over 150 add 2 units Three times a day   labetalol (NORMODYNE) 100 MG tablet Take 100 mg by mouth 2 (two) times daily.   methimazole (TAPAZOLE) 5 MG tablet Take 2.5 mg by mouth daily.    Multiple Vitamin (MULTIVITAMIN WITH MINERALS) TABS tablet Take 1 tablet by mouth daily. Unknown strength   mycophenolate (MYFORTIC) 180 MG EC tablet Take 180 mg by mouth 2 (two) times daily.    ondansetron (ZOFRAN) 8 MG tablet Take 1 tablet (8 mg total) by mouth every 8 (eight) hours as needed for nausea or vomiting.   ONETOUCH ULTRA test strip 1 each by Other route as needed for other (Glucose check).   pantoprazole (PROTONIX) 40 MG tablet Take 1 tablet (40 mg total) by mouth 2 (two) times daily before a meal.   predniSONE (DELTASONE) 5 MG tablet Take 1 tablet (5 mg total) by mouth daily with breakfast.   St Johns Wort 300 MG CAPS Take 300 mg by mouth 3 (three) times daily.   sulfamethoxazole-trimethoprim (BACTRIM) 400-80 MG tablet Take 1 tablet by mouth every Monday, Wednesday, and Friday.   tacrolimus (PROGRAF) 1 MG capsule Take 2 mg by mouth 2 (two) times daily.   tamsulosin (FLOMAX) 0.4 MG CAPS capsule Take 0.4 mg by mouth at bedtime.   TRADJENTA 5 MG TABS tablet Take 5 mg by mouth daily.   vitamin B-12 (CYANOCOBALAMIN) 1000 MCG tablet Take 1,000 mcg by mouth 2 (two) times daily.    vitamin C (ASCORBIC ACID) 500 MG tablet Take 500 mg by mouth daily.   Zinc 30 MG CAPS Take 30 mg by mouth 2 (two) times daily.     Allergies:   Lisinopril, Meloxicam, Victoza [liraglutide], and  Cyclobenzaprine   Social History   Socioeconomic History   Marital status: Married    Spouse name: agnes  Number of children: 5   Years of education: 8th   Highest education level: Not on file  Occupational History   Occupation: disabled  Tobacco Use   Smoking status: Former    Current packs/day: 0.00    Average packs/day: 0.5 packs/day for 40.0 years (20.0 ttl pk-yrs)    Types: Cigarettes    Start date: 12/27/1972    Quit date: 12/27/2012    Years since quitting: 10.2   Smokeless tobacco: Never  Vaping Use   Vaping status: Never Used  Substance and Sexual Activity   Alcohol use: No    Alcohol/week: 0.0 standard drinks of alcohol   Drug use: No   Sexual activity: Yes  Other Topics Concern   Not on file  Social History Narrative   Patient lives at home with his wife Nicole Kindred).  One story home with basement.   Patient is disabled.   Education 8th grade.   Right handed.   Caffeine One cup of coffee daily.   Retired Naval architect.   5 children.   Social Drivers of Corporate investment banker Strain: Not on file  Food Insecurity: No Food Insecurity (01/28/2019)   Hunger Vital Sign    Worried About Running Out of Food in the Last Year: Never true    Ran Out of Food in the Last Year: Never true  Transportation Needs: No Transportation Needs (01/28/2019)   PRAPARE - Administrator, Civil Service (Medical): No    Lack of Transportation (Non-Medical): No  Physical Activity: Not on file  Stress: Not on file  Social Connections: Not on file     Family History: The patient's family history includes Diabetes in his brother; Heart disease in his father; Hyperlipidemia in his father and son; Hypertension in his father and mother; Renal Disease in his father. ROS:   Please see the history of present illness.    All 14 point review of systems negative except as described per history of present illness  EKGs/Labs/Other Studies Reviewed:         Recent  Labs: 03/31/2023: ALT 60; BUN 26; Creatinine, Ser 1.89; Hemoglobin 13.3; Platelets 110; Potassium 5.5; Sodium 141  Recent Lipid Panel    Component Value Date/Time   CHOL 137 01/02/2017 0343   TRIG 113 01/04/2019 0011   HDL 38 (L) 01/02/2017 0343   CHOLHDL 3.6 01/02/2017 0343   VLDL 16 01/02/2017 0343   LDLCALC 83 01/02/2017 0343    Physical Exam:    VS:  BP (!) 150/88 (BP Location: Right Arm, Patient Position: Sitting)   Pulse 91   Ht 5\' 11"  (1.803 m)   Wt 254 lb 3.2 oz (115.3 kg)   SpO2 93%   BMI 35.45 kg/m     Wt Readings from Last 3 Encounters:  04/05/23 254 lb 3.2 oz (115.3 kg)  03/31/23 259 lb 4.2 oz (117.6 kg)  01/26/23 259 lb 3.2 oz (117.6 kg)     GEN:  Well nourished, well developed in no acute distress HEENT: Normal NECK: No JVD; No carotid bruits LYMPHATICS: No lymphadenopathy CARDIAC: RRR, systolic murmur ejection type right upper portion of the sternal 1-2/6 no rubs, no gallops RESPIRATORY:  Clear to auscultation without rales, wheezing or rhonchi  ABDOMEN: Soft, non-tender, non-distended MUSCULOSKELETAL:  No edema; No deformity  SKIN: Warm and dry LOWER EXTREMITIES: no swelling NEUROLOGIC:  Alert and oriented x 3 PSYCHIATRIC:  Normal affect   ASSESSMENT:    1. Essential (primary) hypertension  2. Pulmonary hypertension, unspecified (HCC)   3. Aneurysm of ascending aorta without rupture (HCC)   4. Immunosuppression (HCC)    PLAN:    In order of problems listed above:  Heart murmur: Schedule him to have echocardiogram. Essential hypertension seems to be uncontrolled, will recheck his blood pressure before he leaves the room today if still elevated we readings initiate his labetalol only 50 mg twice daily. Pulmonary hypertension echocardiogram will be done. Aneurysm of the ascending aorta 40 mm but with the size is probably normal we will follow this with echocardiogram. Kidney transplant with immunosuppression followed by team from  [facility]. Dyslipidemia I did review K PN which show me total cholesterol 139 HDL 37 LDL 70 continue present management   Medication Adjustments/Labs and Tests Ordered: Current medicines are reviewed at length with the patient today.  Concerns regarding medicines are outlined above.  No orders of the defined types were placed in this encounter.  Medication changes: No orders of the defined types were placed in this encounter.   Signed, Georgeanna Lea, MD, [facility] 04/05/2023 9:48 AM    [facility]

## 2023-04-05 NOTE — Addendum Note (Signed)
 Addended by: Roxanne Mins I on: 04/05/2023 10:08 AM   Modules accepted: Orders

## 2023-04-05 NOTE — Patient Instructions (Signed)
 Medication Instructions:  Your physician has recommended you make the following change in your medication:   START: Labetalol 50 mg two times daily   *If you need a refill on your cardiac medications before your next appointment, please call your pharmacy*   Lab Work: None If you have labs (blood work) drawn today and your tests are completely normal, you will receive your results only by: MyChart Message (if you have MyChart) OR A paper copy in the mail If you have any lab test that is abnormal or we need to change your treatment, we will call you to review the results.   Testing/Procedures: Your physician has requested that you have an echocardiogram. Echocardiography is a painless test that uses sound waves to create images of your heart. It provides your doctor with information about the size and shape of your heart and how well your heart's chambers and valves are working. This procedure takes approximately one hour. There are no restrictions for this procedure. Please do NOT wear cologne, perfume, aftershave, or lotions (deodorant is allowed). Please arrive 15 minutes prior to your appointment time.  Please note: We ask at that you not bring children with you during ultrasound (echo/ vascular) testing. Due to room size and safety concerns, children are not allowed in the ultrasound rooms during exams. Our front office staff cannot provide observation of children in our lobby area while testing is being conducted. An adult accompanying a patient to their appointment will only be allowed in the ultrasound room at the discretion of the ultrasound technician under special circumstances. We apologize for any inconvenience.    Follow-Up: At [facility], you and your health needs are our priority.  As part of our continuing mission to provide you with exceptional heart care, we have created designated Provider Care Teams.  These Care Teams include your primary Cardiologist  (physician) and Advanced Practice Providers (APPs -  Physician Assistants and Nurse Practitioners) who all work together to provide you with the care you need, when you need it.  We recommend signing up for the patient portal called "MyChart".  Sign up information is provided on this After Visit Summary.  MyChart is used to connect with patients for Virtual Visits (Telemedicine).  Patients are able to view lab/test results, encounter notes, upcoming appointments, etc.  Non-urgent messages can be sent to your provider as well.   To learn more about what you can do with MyChart, go to [email or web address].    Your next appointment:   6 month(s)  Provider:   Gypsy Balsam, MD    Other Instructions None

## 2023-04-10 DIAGNOSIS — G4733 Obstructive sleep apnea (adult) (pediatric): Secondary | ICD-10-CM | POA: Diagnosis not present

## 2023-04-10 DIAGNOSIS — Z794 Long term (current) use of insulin: Secondary | ICD-10-CM | POA: Diagnosis not present

## 2023-04-10 DIAGNOSIS — E1169 Type 2 diabetes mellitus with other specified complication: Secondary | ICD-10-CM | POA: Diagnosis not present

## 2023-04-16 DIAGNOSIS — G629 Polyneuropathy, unspecified: Secondary | ICD-10-CM | POA: Diagnosis not present

## 2023-04-16 DIAGNOSIS — M542 Cervicalgia: Secondary | ICD-10-CM | POA: Diagnosis not present

## 2023-04-16 DIAGNOSIS — Z9181 History of falling: Secondary | ICD-10-CM | POA: Diagnosis not present

## 2023-04-16 DIAGNOSIS — M170 Bilateral primary osteoarthritis of knee: Secondary | ICD-10-CM | POA: Diagnosis not present

## 2023-04-16 DIAGNOSIS — M5116 Intervertebral disc disorders with radiculopathy, lumbar region: Secondary | ICD-10-CM | POA: Diagnosis not present

## 2023-04-25 ENCOUNTER — Ambulatory Visit

## 2023-05-04 ENCOUNTER — Ambulatory Visit: Attending: Cardiology

## 2023-05-04 DIAGNOSIS — I10 Essential (primary) hypertension: Secondary | ICD-10-CM

## 2023-05-04 DIAGNOSIS — D849 Immunodeficiency, unspecified: Secondary | ICD-10-CM

## 2023-05-04 DIAGNOSIS — I7121 Aneurysm of the ascending aorta, without rupture: Secondary | ICD-10-CM

## 2023-05-04 DIAGNOSIS — I272 Pulmonary hypertension, unspecified: Secondary | ICD-10-CM | POA: Diagnosis not present

## 2023-05-04 LAB — ECHOCARDIOGRAM COMPLETE
AR max vel: 1.77 cm2
AV Area VTI: 2 cm2
AV Area mean vel: 1.81 cm2
AV Mean grad: 16.7 mmHg
AV Peak grad: 31.1 mmHg
Ao pk vel: 2.79 m/s
P 1/2 time: 412 ms
S' Lateral: 3.1 cm

## 2023-05-10 DIAGNOSIS — R7989 Other specified abnormal findings of blood chemistry: Secondary | ICD-10-CM | POA: Diagnosis not present

## 2023-05-10 DIAGNOSIS — Z940 Kidney transplant status: Secondary | ICD-10-CM | POA: Diagnosis not present

## 2023-05-16 DIAGNOSIS — G629 Polyneuropathy, unspecified: Secondary | ICD-10-CM | POA: Diagnosis not present

## 2023-05-16 DIAGNOSIS — M170 Bilateral primary osteoarthritis of knee: Secondary | ICD-10-CM | POA: Diagnosis not present

## 2023-05-16 DIAGNOSIS — Z9181 History of falling: Secondary | ICD-10-CM | POA: Diagnosis not present

## 2023-05-16 DIAGNOSIS — M542 Cervicalgia: Secondary | ICD-10-CM | POA: Diagnosis not present

## 2023-05-16 DIAGNOSIS — M5116 Intervertebral disc disorders with radiculopathy, lumbar region: Secondary | ICD-10-CM | POA: Diagnosis not present

## 2023-05-23 DIAGNOSIS — I10 Essential (primary) hypertension: Secondary | ICD-10-CM | POA: Diagnosis not present

## 2023-05-23 DIAGNOSIS — N184 Chronic kidney disease, stage 4 (severe): Secondary | ICD-10-CM | POA: Diagnosis not present

## 2023-05-23 DIAGNOSIS — I272 Pulmonary hypertension, unspecified: Secondary | ICD-10-CM | POA: Diagnosis not present

## 2023-05-23 DIAGNOSIS — E1169 Type 2 diabetes mellitus with other specified complication: Secondary | ICD-10-CM | POA: Diagnosis not present

## 2023-05-23 DIAGNOSIS — N189 Chronic kidney disease, unspecified: Secondary | ICD-10-CM | POA: Diagnosis not present

## 2023-05-23 DIAGNOSIS — R809 Proteinuria, unspecified: Secondary | ICD-10-CM | POA: Diagnosis not present

## 2023-05-23 DIAGNOSIS — E0590 Thyrotoxicosis, unspecified without thyrotoxic crisis or storm: Secondary | ICD-10-CM | POA: Diagnosis not present

## 2023-05-23 DIAGNOSIS — E785 Hyperlipidemia, unspecified: Secondary | ICD-10-CM | POA: Diagnosis not present

## 2023-05-23 DIAGNOSIS — N2581 Secondary hyperparathyroidism of renal origin: Secondary | ICD-10-CM | POA: Diagnosis not present

## 2023-05-23 DIAGNOSIS — Z0000 Encounter for general adult medical examination without abnormal findings: Secondary | ICD-10-CM | POA: Diagnosis not present

## 2023-05-23 DIAGNOSIS — I509 Heart failure, unspecified: Secondary | ICD-10-CM | POA: Diagnosis not present

## 2023-05-25 ENCOUNTER — Ambulatory Visit: Payer: Self-pay

## 2023-05-25 DIAGNOSIS — D649 Anemia, unspecified: Secondary | ICD-10-CM | POA: Diagnosis not present

## 2023-05-25 DIAGNOSIS — N289 Disorder of kidney and ureter, unspecified: Secondary | ICD-10-CM | POA: Diagnosis not present

## 2023-06-01 DIAGNOSIS — E0590 Thyrotoxicosis, unspecified without thyrotoxic crisis or storm: Secondary | ICD-10-CM | POA: Diagnosis not present

## 2023-06-08 DIAGNOSIS — M48062 Spinal stenosis, lumbar region with neurogenic claudication: Secondary | ICD-10-CM | POA: Diagnosis not present

## 2023-06-08 DIAGNOSIS — M25551 Pain in right hip: Secondary | ICD-10-CM | POA: Diagnosis not present

## 2023-06-10 DIAGNOSIS — E1169 Type 2 diabetes mellitus with other specified complication: Secondary | ICD-10-CM | POA: Diagnosis not present

## 2023-06-10 DIAGNOSIS — E785 Hyperlipidemia, unspecified: Secondary | ICD-10-CM | POA: Diagnosis not present

## 2023-06-10 DIAGNOSIS — I2510 Atherosclerotic heart disease of native coronary artery without angina pectoris: Secondary | ICD-10-CM | POA: Diagnosis not present

## 2023-06-10 DIAGNOSIS — E1165 Type 2 diabetes mellitus with hyperglycemia: Secondary | ICD-10-CM | POA: Diagnosis not present

## 2023-06-20 DIAGNOSIS — D631 Anemia in chronic kidney disease: Secondary | ICD-10-CM | POA: Diagnosis not present

## 2023-06-20 DIAGNOSIS — R7989 Other specified abnormal findings of blood chemistry: Secondary | ICD-10-CM | POA: Diagnosis not present

## 2023-06-20 DIAGNOSIS — K922 Gastrointestinal hemorrhage, unspecified: Secondary | ICD-10-CM | POA: Diagnosis not present

## 2023-06-20 DIAGNOSIS — N2581 Secondary hyperparathyroidism of renal origin: Secondary | ICD-10-CM | POA: Diagnosis not present

## 2023-06-20 DIAGNOSIS — E1129 Type 2 diabetes mellitus with other diabetic kidney complication: Secondary | ICD-10-CM | POA: Diagnosis not present

## 2023-06-20 DIAGNOSIS — M109 Gout, unspecified: Secondary | ICD-10-CM | POA: Diagnosis not present

## 2023-06-20 DIAGNOSIS — B348 Other viral infections of unspecified site: Secondary | ICD-10-CM | POA: Diagnosis not present

## 2023-06-20 DIAGNOSIS — N189 Chronic kidney disease, unspecified: Secondary | ICD-10-CM | POA: Diagnosis not present

## 2023-06-20 DIAGNOSIS — R809 Proteinuria, unspecified: Secondary | ICD-10-CM | POA: Diagnosis not present

## 2023-06-20 DIAGNOSIS — Z940 Kidney transplant status: Secondary | ICD-10-CM | POA: Diagnosis not present

## 2023-06-20 DIAGNOSIS — I10 Essential (primary) hypertension: Secondary | ICD-10-CM | POA: Diagnosis not present

## 2023-06-27 ENCOUNTER — Encounter: Payer: Self-pay | Admitting: Gastroenterology

## 2023-07-10 DIAGNOSIS — E785 Hyperlipidemia, unspecified: Secondary | ICD-10-CM | POA: Diagnosis not present

## 2023-07-10 DIAGNOSIS — E1165 Type 2 diabetes mellitus with hyperglycemia: Secondary | ICD-10-CM | POA: Diagnosis not present

## 2023-07-10 DIAGNOSIS — I2510 Atherosclerotic heart disease of native coronary artery without angina pectoris: Secondary | ICD-10-CM | POA: Diagnosis not present

## 2023-07-10 DIAGNOSIS — E1169 Type 2 diabetes mellitus with other specified complication: Secondary | ICD-10-CM | POA: Diagnosis not present

## 2023-07-11 DIAGNOSIS — E1169 Type 2 diabetes mellitus with other specified complication: Secondary | ICD-10-CM | POA: Diagnosis not present

## 2023-07-11 DIAGNOSIS — Z794 Long term (current) use of insulin: Secondary | ICD-10-CM | POA: Diagnosis not present

## 2023-07-12 DIAGNOSIS — D122 Benign neoplasm of ascending colon: Secondary | ICD-10-CM | POA: Diagnosis not present

## 2023-07-12 DIAGNOSIS — Z794 Long term (current) use of insulin: Secondary | ICD-10-CM | POA: Diagnosis not present

## 2023-07-12 DIAGNOSIS — Z7984 Long term (current) use of oral hypoglycemic drugs: Secondary | ICD-10-CM | POA: Diagnosis not present

## 2023-07-12 DIAGNOSIS — Z940 Kidney transplant status: Secondary | ICD-10-CM | POA: Diagnosis not present

## 2023-07-12 DIAGNOSIS — G4733 Obstructive sleep apnea (adult) (pediatric): Secondary | ICD-10-CM | POA: Diagnosis not present

## 2023-07-12 DIAGNOSIS — K635 Polyp of colon: Secondary | ICD-10-CM | POA: Diagnosis not present

## 2023-07-12 DIAGNOSIS — K644 Residual hemorrhoidal skin tags: Secondary | ICD-10-CM | POA: Diagnosis not present

## 2023-07-12 DIAGNOSIS — Z8673 Personal history of transient ischemic attack (TIA), and cerebral infarction without residual deficits: Secondary | ICD-10-CM | POA: Diagnosis not present

## 2023-07-12 DIAGNOSIS — I10 Essential (primary) hypertension: Secondary | ICD-10-CM | POA: Diagnosis not present

## 2023-07-12 DIAGNOSIS — Z79899 Other long term (current) drug therapy: Secondary | ICD-10-CM | POA: Diagnosis not present

## 2023-07-12 DIAGNOSIS — K621 Rectal polyp: Secondary | ICD-10-CM | POA: Diagnosis not present

## 2023-07-12 DIAGNOSIS — Z888 Allergy status to other drugs, medicaments and biological substances status: Secondary | ICD-10-CM | POA: Diagnosis not present

## 2023-07-12 DIAGNOSIS — Z87891 Personal history of nicotine dependence: Secondary | ICD-10-CM | POA: Diagnosis not present

## 2023-07-12 DIAGNOSIS — K648 Other hemorrhoids: Secondary | ICD-10-CM | POA: Diagnosis not present

## 2023-07-12 DIAGNOSIS — Z1211 Encounter for screening for malignant neoplasm of colon: Secondary | ICD-10-CM | POA: Diagnosis not present

## 2023-07-12 DIAGNOSIS — E119 Type 2 diabetes mellitus without complications: Secondary | ICD-10-CM | POA: Diagnosis not present

## 2023-07-12 DIAGNOSIS — D126 Benign neoplasm of colon, unspecified: Secondary | ICD-10-CM | POA: Diagnosis not present

## 2023-07-28 ENCOUNTER — Other Ambulatory Visit: Payer: Self-pay

## 2023-07-28 ENCOUNTER — Emergency Department (HOSPITAL_COMMUNITY)
Admission: EM | Admit: 2023-07-28 | Discharge: 2023-07-28 | Disposition: A | Discharge status: Home / Self Care | Attending: Emergency Medicine | Admitting: Emergency Medicine

## 2023-07-28 ENCOUNTER — Encounter (HOSPITAL_COMMUNITY): Payer: Self-pay | Admitting: *Deleted

## 2023-07-28 ENCOUNTER — Emergency Department (HOSPITAL_COMMUNITY)

## 2023-07-28 DIAGNOSIS — R109 Unspecified abdominal pain: Secondary | ICD-10-CM | POA: Insufficient documentation

## 2023-07-28 DIAGNOSIS — K8689 Other specified diseases of pancreas: Secondary | ICD-10-CM | POA: Diagnosis not present

## 2023-07-28 DIAGNOSIS — S60453A Superficial foreign body of left middle finger, initial encounter: Secondary | ICD-10-CM | POA: Diagnosis not present

## 2023-07-28 DIAGNOSIS — E875 Hyperkalemia: Secondary | ICD-10-CM | POA: Insufficient documentation

## 2023-07-28 DIAGNOSIS — S60451A Superficial foreign body of left index finger, initial encounter: Secondary | ICD-10-CM | POA: Diagnosis not present

## 2023-07-28 DIAGNOSIS — R188 Other ascites: Secondary | ICD-10-CM | POA: Diagnosis not present

## 2023-07-28 DIAGNOSIS — W458XXA Other foreign body or object entering through skin, initial encounter: Secondary | ICD-10-CM | POA: Insufficient documentation

## 2023-07-28 DIAGNOSIS — R1084 Generalized abdominal pain: Secondary | ICD-10-CM | POA: Diagnosis not present

## 2023-07-28 DIAGNOSIS — K4020 Bilateral inguinal hernia, without obstruction or gangrene, not specified as recurrent: Secondary | ICD-10-CM | POA: Diagnosis not present

## 2023-07-28 DIAGNOSIS — Z794 Long term (current) use of insulin: Secondary | ICD-10-CM | POA: Diagnosis not present

## 2023-07-28 DIAGNOSIS — S6992XA Unspecified injury of left wrist, hand and finger(s), initial encounter: Secondary | ICD-10-CM | POA: Diagnosis present

## 2023-07-28 DIAGNOSIS — E860 Dehydration: Secondary | ICD-10-CM | POA: Insufficient documentation

## 2023-07-28 DIAGNOSIS — R197 Diarrhea, unspecified: Secondary | ICD-10-CM | POA: Insufficient documentation

## 2023-07-28 DIAGNOSIS — Z7982 Long term (current) use of aspirin: Secondary | ICD-10-CM | POA: Diagnosis not present

## 2023-07-28 DIAGNOSIS — K591 Functional diarrhea: Secondary | ICD-10-CM | POA: Diagnosis not present

## 2023-07-28 LAB — LIPASE, BLOOD: Lipase: 24 U/L (ref 11–51)

## 2023-07-28 LAB — COMPREHENSIVE METABOLIC PANEL WITH GFR
ALT: 47 U/L — ABNORMAL HIGH (ref 0–44)
AST: 66 U/L — ABNORMAL HIGH (ref 15–41)
Albumin: 3.6 g/dL (ref 3.5–5.0)
Alkaline Phosphatase: 89 U/L (ref 38–126)
Anion gap: 10 (ref 5–15)
BUN: 32 mg/dL — ABNORMAL HIGH (ref 8–23)
CO2: 20 mmol/L — ABNORMAL LOW (ref 22–32)
Calcium: 9.2 mg/dL (ref 8.9–10.3)
Chloride: 110 mmol/L (ref 98–111)
Creatinine, Ser: 2.43 mg/dL — ABNORMAL HIGH (ref 0.61–1.24)
GFR, Estimated: 28 mL/min — ABNORMAL LOW (ref >60)
Glucose, Bld: 169 mg/dL — ABNORMAL HIGH (ref 70–99)
Potassium: 5.7 mmol/L — ABNORMAL HIGH (ref 3.5–5.1)
Sodium: 140 mmol/L (ref 135–145)
Total Bilirubin: 0.9 mg/dL (ref 0.0–1.2)
Total Protein: 6.9 g/dL (ref 6.5–8.1)

## 2023-07-28 LAB — CBC WITH DIFFERENTIAL/PLATELET
Abs Immature Granulocytes: 0.03 K/uL (ref 0.00–0.07)
Basophils Absolute: 0 K/uL (ref 0.0–0.1)
Basophils Relative: 0 %
Eosinophils Absolute: 0.1 K/uL (ref 0.0–0.5)
Eosinophils Relative: 1 %
HCT: 40.5 % (ref 39.0–52.0)
Hemoglobin: 13.2 g/dL (ref 13.0–17.0)
Immature Granulocytes: 0 %
Lymphocytes Relative: 10 %
Lymphs Abs: 0.8 K/uL (ref 0.7–4.0)
MCH: 31.9 pg (ref 26.0–34.0)
MCHC: 32.6 g/dL (ref 30.0–36.0)
MCV: 97.8 fL (ref 80.0–100.0)
Monocytes Absolute: 0.4 K/uL (ref 0.1–1.0)
Monocytes Relative: 5 %
Neutro Abs: 7.2 K/uL (ref 1.7–7.7)
Neutrophils Relative %: 84 %
Platelets: 128 K/uL — ABNORMAL LOW (ref 150–400)
RBC: 4.14 MIL/uL — ABNORMAL LOW (ref 4.22–5.81)
RDW: 13.5 % (ref 11.5–15.5)
WBC: 8.6 K/uL (ref 4.0–10.5)
nRBC: 0 % (ref 0.0–0.2)

## 2023-07-28 LAB — POTASSIUM: Potassium: 5 mmol/L (ref 3.5–5.1)

## 2023-07-28 LAB — URINALYSIS, W/ REFLEX TO CULTURE (INFECTION SUSPECTED)
Bacteria, UA: NONE SEEN
Bilirubin Urine: NEGATIVE
Glucose, UA: 50 mg/dL — AB
Hgb urine dipstick: NEGATIVE
Ketones, ur: NEGATIVE mg/dL
Leukocytes,Ua: NEGATIVE
Nitrite: NEGATIVE
Protein, ur: NEGATIVE mg/dL
Specific Gravity, Urine: 1.016 (ref 1.005–1.030)
pH: 5 (ref 5.0–8.0)

## 2023-07-28 LAB — I-STAT CHEM 8, ED
BUN: 40 mg/dL — ABNORMAL HIGH (ref 8–23)
Calcium, Ion: 1.14 mmol/L — ABNORMAL LOW (ref 1.15–1.40)
Chloride: 112 mmol/L — ABNORMAL HIGH (ref 98–111)
Creatinine, Ser: 2.5 mg/dL — ABNORMAL HIGH (ref 0.61–1.24)
Glucose, Bld: 167 mg/dL — ABNORMAL HIGH (ref 70–99)
HCT: 40 % (ref 39.0–52.0)
Hemoglobin: 13.6 g/dL (ref 13.0–17.0)
Potassium: 5.7 mmol/L — ABNORMAL HIGH (ref 3.5–5.1)
Sodium: 141 mmol/L (ref 135–145)
TCO2: 22 mmol/L (ref 22–32)

## 2023-07-28 MED ORDER — SODIUM ZIRCONIUM CYCLOSILICATE 10 G PO PACK
10.0000 g | PACK | Freq: Once | ORAL | Status: AC
  Administered 2023-07-28: 10 g via ORAL
  Filled 2023-07-28: qty 1

## 2023-07-28 MED ORDER — LIDOCAINE-EPINEPHRINE (PF) 2 %-1:200000 IJ SOLN
10.0000 mL | Freq: Once | INTRAMUSCULAR | Status: AC
  Administered 2023-07-28: 10 mL via INTRADERMAL
  Filled 2023-07-28: qty 20

## 2023-07-28 MED ORDER — MORPHINE SULFATE (PF) 2 MG/ML IV SOLN
2.0000 mg | Freq: Once | INTRAVENOUS | Status: AC
  Administered 2023-07-28: 2 mg via INTRAVENOUS
  Filled 2023-07-28: qty 1

## 2023-07-28 MED ORDER — SODIUM CHLORIDE 0.9 % IV BOLUS
1000.0000 mL | Freq: Once | INTRAVENOUS | Status: AC
  Administered 2023-07-28: 1000 mL via INTRAVENOUS

## 2023-07-28 NOTE — ED Provider Triage Note (Signed)
 Emergency Medicine Provider Triage Evaluation Note  Jon [name] , a 73 y.o. male  was evaluated in triage.  Pt complains of abdominal pain.  Review of Systems  Positive:  Negative:   Physical Exam  BP (!) 154/66   Pulse 78   Temp 98 [name] (36.7 C)   Resp 15   Ht 5' 11 (1.803 m)   Wt 115.3 kg   SpO2 94%   BMI 35.45 kg/m  Gen:   Awake, no distress   Resp:  Normal effort  MSK:   Moves extremities without difficulty  Other:    Medical Decision Making  Medically screening exam initiated at 4:22 PM.  Appropriate orders placed.  Jon [name] was informed that the remainder of the evaluation will be completed by another provider, this initial triage assessment does not replace that evaluation, and the importance of remaining in the ED until their evaluation is complete.  Colonoscopy 2 weeks ago with polyps. Nausea, vomiting, and diarrhea x3 days. Lower abdominal pain started today. Endorses dark stools but has also been taking Pepto-bismol for the past 3 days as well. Denies hematemesis or hematochezia.    Jon [name], [place] 07/28/23 1624

## 2023-07-28 NOTE — ED Provider Notes (Signed)
 [place] [facility] AT [facility] Provider Note   CSN: [id number] Arrival date & time: 07/28/23  1556     Patient presents with: Diarrhea and Foreign Body   Jon [name] is a 73 y.o. male.   73 yo M with a chief complaints of diarrhea.  This been going on for a few days now.  No known sick contacts has had some emesis off and on with it.  Has been able to eat and drink with every time he does he is normal bowel movements and has been doing less of it.  Cramping that improves with bowel movements.  Denies bloody stool.  Has been dark and looks kind of green to him.  He denies suspicious food or water intake.  Denies recent travel.   Diarrhea Foreign Body      Prior to Admission medications   Medication Sig Start Date End Date Taking? Authorizing Provider  amLODipine  (NORVASC ) 10 MG tablet Take 10 mg by mouth daily. 12/16/19   [provider]  aspirin  81 MG EC tablet Take 81 mg by mouth daily. Swallow whole.    [provider]  atorvastatin  (LIPITOR) 40 MG tablet Take 40 mg by mouth at bedtime. Patient not taking: Reported on 04/05/2023 12/18/19   [provider]  calcitRIOL  (ROCALTROL ) 0.25 MCG capsule Take 0.25 mcg by mouth every Monday, Tuesday, Wednesday, Thursday, and Friday.    [provider]  cholecalciferol (VITAMIN D3) 25 MCG (1000 UNIT) tablet Take 1,000 Units by mouth daily.    [provider]  colchicine 0.6 MG tablet Take 0.6 mg by mouth 2 (two) times daily. 09/29/21   [provider]  febuxostat (ULORIC) 40 MG tablet Take 1 tablet by mouth daily. 08/03/21   [provider]  ferrous sulfate 325 (65 FE) MG EC tablet Take 325 mg by mouth daily with breakfast.    [provider]  furosemide  (LASIX ) 40 MG tablet Take 80 mg by mouth in the morning. 1 every afternoon 06/09/21   [provider]  gabapentin  (NEURONTIN ) 300 MG capsule Take 300 mg by mouth 3 (three) times  daily.    [provider]  HYDROcodone -acetaminophen  (NORCO) 10-325 MG tablet Take 1-2 tablets by mouth every 4 (four) hours as needed for moderate pain. 04/09/20   Bergman, Meghan D, NP  Insulin  Glargine (BASAGLAR  KWIKPEN) 100 UNIT/ML Inject 49 Units into the skin at bedtime.    [provider]  insulin  lispro (HUMALOG KWIKPEN) 100 UNIT/ML KwikPen Inject 5 Units into the skin See admin instructions. Sliding scale over 150 add 2 units Three times a day    [provider]  labetalol  (NORMODYNE ) 100 MG tablet Take 0.5 tablets (50 mg total) by mouth 2 (two) times daily. 04/05/23   Krasowski, Robert J, MD  methimazole  (TAPAZOLE ) 5 MG tablet Take 2.5 mg by mouth daily.  06/29/17   [provider]  Multiple Vitamin (MULTIVITAMIN WITH MINERALS) TABS tablet Take 1 tablet by mouth daily. Unknown strength    [provider]  mycophenolate  (MYFORTIC ) 180 MG EC tablet Take 180 mg by mouth 2 (two) times daily.     [provider]  ondansetron  (ZOFRAN ) 8 MG tablet Take 1 tablet (8 mg total) by mouth every 8 (eight) hours as needed for nausea or vomiting. 03/31/23   Randol Simmonds, MD  [facility] ULTRA test strip 1 each by Other route as needed for other (Glucose check). 07/09/20   [provider]  pantoprazole  (  PROTONIX ) 40 MG tablet Take 1 tablet (40 mg total) by mouth 2 (two) times daily before a meal. 01/27/19   Earley Saucer, MD  predniSONE  (DELTASONE ) 5 MG tablet Take 1 tablet (5 mg total) by mouth daily with breakfast. 01/17/19   Sebastian Toribio GAILS, MD  [facility] Wort 300 MG CAPS Take 300 mg by mouth 3 (three) times daily.    [provider]  sulfamethoxazole -trimethoprim  (BACTRIM ) 400-80 MG tablet Take 1 tablet by mouth every Monday, Wednesday, and Friday. 02/18/19   [provider]  tacrolimus  (PROGRAF ) 1 MG capsule Take 2 mg by mouth 2 (two) times daily. 06/28/17   [provider]  tamsulosin  (FLOMAX ) 0.4 MG CAPS capsule Take 0.4 mg by  mouth at bedtime.    [provider]  TRADJENTA  5 MG TABS tablet Take 5 mg by mouth daily. 04/17/18   [provider]  vitamin B-12 (CYANOCOBALAMIN ) 1000 MCG tablet Take 1,000 mcg by mouth 2 (two) times daily.     [provider]  vitamin C (ASCORBIC ACID) 500 MG tablet Take 500 mg by mouth daily.    [provider]  Zinc  30 MG CAPS Take 30 mg by mouth 2 (two) times daily.    [provider]    Allergies: Lisinopril, Meloxicam, Victoza [liraglutide], and Cyclobenzaprine     Review of Systems  Gastrointestinal:  Positive for diarrhea.    Updated Vital Signs BP (!) 144/56   Pulse 87   Temp 98.1 F (36.7 C) (Oral)   Resp 18   Ht 5' 11 (1.803 m)   Wt 115.3 kg   SpO2 95%   BMI 35.45 kg/m   Physical Exam Vitals and nursing note reviewed.  Constitutional:      Appearance: He is well-developed.  HENT:     Head: Normocephalic and atraumatic.  Eyes:     Pupils: Pupils are equal, round, and reactive to light.  Neck:     Vascular: No JVD.  Cardiovascular:     Rate and Rhythm: Normal rate and regular rhythm.     Heart sounds: No murmur heard.    No friction rub. No gallop.  Pulmonary:     Effort: No respiratory distress.     Breath sounds: No wheezing.  Abdominal:     General: There is no distension.     Tenderness: There is no abdominal tenderness. There is no guarding or rebound.  Musculoskeletal:        General: Normal range of motion.     Cervical back: Normal range of motion and neck supple.     Comments: Patient's left third digit has a wooden splinter under the nail.  The distal aspect of the nail has been torn off.    Skin:    Coloration: Skin is not pale.     Findings: No rash.  Neurological:     Mental Status: He is alert and oriented to person, place, and time.  Psychiatric:        Behavior: Behavior normal.     (all labs ordered are listed, but only abnormal results are displayed) Labs Reviewed  COMPREHENSIVE  METABOLIC PANEL WITH GFR - Abnormal; Notable for the following components:      Result Value   Potassium 5.7 (*)    CO2 20 (*)    Glucose, Bld 169 (*)    BUN 32 (*)    Creatinine, Ser 2.43 (*)    AST 66 (*)    ALT 47 (*)  GFR, Estimated 28 (*)    All other components within normal limits  CBC WITH DIFFERENTIAL/PLATELET - Abnormal; Notable for the following components:   RBC 4.14 (*)    Platelets 128 (*)    All other components within normal limits  URINALYSIS, W/ REFLEX TO CULTURE (INFECTION SUSPECTED) - Abnormal; Notable for the following components:   Glucose, UA 50 (*)    All other components within normal limits  I-STAT CHEM 8, ED - Abnormal; Notable for the following components:   Potassium 5.7 (*)    Chloride 112 (*)    BUN 40 (*)    Creatinine, Ser 2.50 (*)    Glucose, Bld 167 (*)    Calcium , Ion 1.14 (*)    All other components within normal limits  LIPASE, BLOOD  POTASSIUM    EKG: None  Radiology: CT ABDOMEN PELVIS WO CONTRAST Result Date: 07/28/2023 CLINICAL DATA:  Abdominal pain EXAM: CT ABDOMEN AND PELVIS WITHOUT CONTRAST TECHNIQUE: Multidetector CT imaging of the abdomen and pelvis was performed following the standard protocol without IV contrast. RADIATION DOSE REDUCTION: This exam was performed according to the departmental dose-optimization program which includes automated exposure control, adjustment of the mA and/or kV according to patient size and/or use of iterative reconstruction technique. COMPARISON:  CT abdomen pelvis March 31 2023, CT abdomen pelvis October 01, 2015 FINDINGS: Lower chest: No acute abnormality. Hepatobiliary: No focal liver abnormality is seen. No gallstones,gallbladder wall thickening, or biliary dilatation. Pancreas: Fatty atrophic changes of the pancreatic head and neck punctate calcifications. No pancreatic ductal dilatation or surrounding inflammatory changes. Spleen: Normal in size without focal abnormality. Adrenals/Urinary Tract:  Adrenal glands are within normal limits. Atrophic changes of the native kidneys with multiple small cysts and punctate calcifications suggestive of end-stage kidneys. Right lower quadrant transplant kidney identified. No hydronephrosis. Bladder is thick-walled and under distended. Stomach/Bowel: Small and large bowel loops appear unremarkable normal appendix. Prominent gastric folds Atrophic changes of the gastric folds. Vascular/Lymphatic: Aortic atherosclerosis. No enlarged abdominal or pelvic lymph nodes. Reproductive: Prostatomegaly. Other: Right lower quadrant anterolateral abdominal wall rim enhancing fluid collection measuring 12 x 5 cm, measured in the same manner 12 x 4 cm on prior exam . The collection is in continuation with lateral cortex of the transplant kidney along the right lower lateral abdominal wall. On prior exam from 2017 this collection measured 8.8 x 1.1 cm. Mild fat stranding is identified around this lesion . Trace pelvic free fluid. Some partially loculated small fluid is identified along the bilateral pericolic gutters and anterior abdominal wall, stable to prior. (6/86: 76). Bilateral fat containing small inguinal hernias. Musculoskeletal: Right femoral rod in place. Posterior spinal fusion device in lower lumbar/sacral spine. IMPRESSION: Rim enhancing fluid collection in the right anterolateral abdominal wall, slightly enlarged to prior and significantly enlarged to 2017. Considering the interval growth and while enhancement recommend further assessment with tissue sampling (rule out infection). Small amount of fluid within the peritoneal cavity with suggestion possible adhesions. Atrophic native end-stage kidneys, similar to prior. Other findings are grossly stable to prior. Electronically Signed   By: Megan  Zare M.D.   On: 07/28/2023 19:11     .Foreign Body Removal  Date/Time: 07/28/2023 8:27 PM  Performed by: Emil Share, DO Authorized by: Emil Share, DO  Consent: Verbal  consent obtained Risks and benefits: risks, benefits and alternatives were discussed Consent given by: patient and spouse Patient understanding: patient states understanding of the procedure being performed Required items: required blood products, implants, devices,  and special equipment available Patient identity confirmed: verbally with patient Time out: Immediately prior to procedure a time out was called to verify the correct patient, procedure, equipment, support staff and site/side marked as required. Body area: skin General location: upper extremity Location details: left long finger Anesthesia: digital block  Anesthesia: Local Anesthetic: lidocaine  2% with epinephrine  Anesthetic total: 2 mL  Sedation: Patient sedated: no  Patient restrained: no Patient cooperative: yes Localization method: visualized Removal mechanism: forceps and hemostat Dressing: dressing applied Depth: subungal. Complexity: simple 1 objects recovered. Objects recovered: splinter Post-procedure assessment: foreign body removed Patient tolerance: patient tolerated the procedure well with no immediate complications     Medications Ordered in the ED  sodium chloride  0.9 % bolus 1,000 mL (1,000 mLs Intravenous New Bag/Given 07/28/23 1856)  morphine  (PF) 2 MG/ML injection 2 mg (2 mg Intravenous Given 07/28/23 1857)  sodium zirconium cyclosilicate  (LOKELMA ) packet 10 g (10 g Oral Given 07/28/23 1856)  lidocaine -EPINEPHrine  (XYLOCAINE  W/EPI) 2 %-1:200000 (PF) injection 10 mL (10 mLs Intradermal Given 07/28/23 2020)                                    Medical Decision Making Amount and/or Complexity of Data Reviewed Labs: ordered. ECG/medicine tests: ordered.  Risk Prescription drug management.   73 yo M with a chief complaints of diarrhea.  Going on for a few days.  He has decreased his oral intake due to the same.  Says he feels that he is very hungry and would like to eat something.  Abdominal  cramping at times.  No fevers no suspicious food intake no other sick contacts.  He also has a splinter underneath the nail plate of his fingers of his left hand.  I did discuss the typical procedure for removal.  He is currently declining.  Will give a bolus of IV fluids.  Awaiting CT imaging.  Patient's blood work with mild dehydration.  His potassium is mildly elevated at 5.7.  I discussed results with the patient.  I reoffered to remove the foreign body and now he is willing to have it performed.  Removed without issue.  Repeat K normal.  D/c home.  PCP follow up.   8:53 PM:  I have discussed the diagnosis/risks/treatment options with the patient.  Evaluation and diagnostic testing in the emergency department does not suggest an emergent condition requiring admission or immediate intervention beyond what has been performed at this time.  They will follow up with PCP. We also discussed returning to the ED immediately if new or worsening sx occur. We discussed the sx which are most concerning (e.g., sudden worsening pain, fever, inability to tolerate by mouth) that necessitate immediate return. Medications administered to the patient during their visit and any new prescriptions provided to the patient are listed below.  Medications given during this visit Medications  sodium chloride  0.9 % bolus 1,000 mL (1,000 mLs Intravenous New Bag/Given 07/28/23 1856)  morphine  (PF) 2 MG/ML injection 2 mg (2 mg Intravenous Given 07/28/23 1857)  sodium zirconium cyclosilicate  (LOKELMA ) packet 10 g (10 g Oral Given 07/28/23 1856)  lidocaine -EPINEPHrine  (XYLOCAINE  W/EPI) 2 %-1:200000 (PF) injection 10 mL (10 mLs Intradermal Given 07/28/23 2020)     The patient appears reasonably screen and/or stabilized for discharge and I doubt any other medical condition or other [facility] requiring further screening, evaluation, or treatment in the ED at this time prior to discharge.  Final diagnoses:  Diarrhea,  unspecified type    ED Discharge Orders     None          Emil Share, DO 07/28/23 2053

## 2023-07-28 NOTE — ED Triage Notes (Signed)
 Pt via POV c/o 3 days of non-bloody diarrhea with diffuse abd pain. Pt also has a large splinter under left middle finger nail. Last tetanus shot was within the past year.

## 2023-07-28 NOTE — Discharge Instructions (Signed)
 Take imodium  for diarrhea. Your kidney function was worse than normal today.  Try and increase your fluid intake at home.  Follow up with your family doc in the office.  Eat and drink as well as you can.

## 2023-07-29 ENCOUNTER — Telehealth: Payer: Self-pay

## 2023-07-29 NOTE — Telephone Encounter (Signed)
 Patient spouse clale din to say that Dr Emil was going to order zofran . NO order in system consulted with Dr Patsey, who gave order zofran  4 mg ODT every 8 hours PRN nausea,  Wife stated preferred pharmacy is listed called in to [facility] on [address].

## 2023-08-02 DIAGNOSIS — R110 Nausea: Secondary | ICD-10-CM | POA: Diagnosis not present

## 2023-08-02 DIAGNOSIS — K219 Gastro-esophageal reflux disease without esophagitis: Secondary | ICD-10-CM | POA: Diagnosis not present

## 2023-08-02 DIAGNOSIS — E1169 Type 2 diabetes mellitus with other specified complication: Secondary | ICD-10-CM | POA: Diagnosis not present

## 2023-08-02 DIAGNOSIS — E785 Hyperlipidemia, unspecified: Secondary | ICD-10-CM | POA: Diagnosis not present

## 2023-08-08 DIAGNOSIS — N2581 Secondary hyperparathyroidism of renal origin: Secondary | ICD-10-CM | POA: Diagnosis not present

## 2023-08-08 DIAGNOSIS — Z940 Kidney transplant status: Secondary | ICD-10-CM | POA: Diagnosis not present

## 2023-08-10 DIAGNOSIS — E785 Hyperlipidemia, unspecified: Secondary | ICD-10-CM | POA: Diagnosis not present

## 2023-08-10 DIAGNOSIS — I2510 Atherosclerotic heart disease of native coronary artery without angina pectoris: Secondary | ICD-10-CM | POA: Diagnosis not present

## 2023-08-10 DIAGNOSIS — E1165 Type 2 diabetes mellitus with hyperglycemia: Secondary | ICD-10-CM | POA: Diagnosis not present

## 2023-08-10 DIAGNOSIS — E1169 Type 2 diabetes mellitus with other specified complication: Secondary | ICD-10-CM | POA: Diagnosis not present

## 2023-08-17 ENCOUNTER — Ambulatory Visit: Admitting: Gastroenterology

## 2023-08-17 ENCOUNTER — Encounter: Payer: Self-pay | Admitting: Gastroenterology

## 2023-08-17 VITALS — BP 130/50 | HR 81 | Ht 71.0 in | Wt 230.4 lb

## 2023-08-17 DIAGNOSIS — R7989 Other specified abnormal findings of blood chemistry: Secondary | ICD-10-CM | POA: Diagnosis not present

## 2023-08-17 DIAGNOSIS — K8689 Other specified diseases of pancreas: Secondary | ICD-10-CM | POA: Diagnosis not present

## 2023-08-17 DIAGNOSIS — R194 Change in bowel habit: Secondary | ICD-10-CM | POA: Diagnosis not present

## 2023-08-17 DIAGNOSIS — R634 Abnormal weight loss: Secondary | ICD-10-CM

## 2023-08-17 DIAGNOSIS — R630 Anorexia: Secondary | ICD-10-CM

## 2023-08-17 DIAGNOSIS — Z860100 Personal history of colon polyps, unspecified: Secondary | ICD-10-CM

## 2023-08-17 DIAGNOSIS — D696 Thrombocytopenia, unspecified: Secondary | ICD-10-CM | POA: Diagnosis not present

## 2023-08-17 NOTE — Progress Notes (Signed)
 I agree with the assessment and plan as outlined by Ms. May. I would recommend doing U/S with elastography in setting of the patient's elevated LFTs and thrombocytopenia. Agree with pancreatic stool elastase

## 2023-08-17 NOTE — Progress Notes (Signed)
 Chief Complaint:elevated LFT's Primary GI Doctor:Dr. Federico   HPI:  Patient is a  73  year old male patient with past medical history of CKD post right kidney transplant Sept 2015 , hypertension, DM, who was referred to me by Dr. Curtis Heman on 06/27/23 for a evaluation of elevated LFT's .    Interval History   Patient presents for evaluation of elevated liver enzymes, accompanied by his wife. No personal history of liver disease. No history of hepatitis. Received blood transfusions back in 2019 during hospitalization after getting COVID where patient states he was in the hospital several days with pneumonia and stomach ulcer requiring multiple blood transfusions.  We reviewed his medications and his wife tells me the only change to his medications was Tradjenta  to [place] in March. Patient started taking milk thistle recently for fatty liver.  Patient's wife tells me she stopped his gabapentin  in July because it was causing restless leg syndrome during the night and affecting his sleep.  Patient and wife states since he has stopped the medication that is no longer an issue.  They plan to notify their primary doctor at the follow-up in a few weeks. No family history of liver disease or CA.  No current alcohol use. No history of heavy alcohol use. No IV drug use.   Patient also tells me a couple weeks ago he started to have nausea with diarrhea and abdominal cramping in the lower abdomen.  He cannot recall any exposure or travel at that time.  He reports his symptoms since then have resolved however he has not had much of an appetite.  We reviewed his weight today and he was unaware that he had lost 24 pounds in the last month.  Patient does state most days he only eats 1 meal or a protein shake.  Patients last colonoscopy was on July 2nd at [name] for surveillance with Dr. Larene.  Patient reports he had 14 benign polyps. He has had a total of 4 colonoscopies,  reports last 2  with polyps.   Patients last EGD was in 2019 when he was hospitalized for stomach ulcer, thought to be due to medication he was.  Patient nor wife could recall what medication it was but states it was discontinued.  Patient does take baby aspirin  81 mg.  Patient's family history includes mother with pancreatic issues? Does not think it was CA  Wt Readings from Last 3 Encounters:  08/17/23 230 lb 6 oz (104.5 kg)  07/28/23 254 lb 3.1 oz (115.3 kg)  04/05/23 254 lb 3.2 oz (115.3 kg)     Past Medical History:  Diagnosis Date   Abnormal gait    Acute blood loss anemia    Acute renal failure superimposed on stage 4 chronic kidney disease (HCC) 06/04/2017   Acute respiratory failure with hypoxia (HCC) 01/04/2019   Covid pneumonia Dec, 2020   Acute right ankle pain 12/08/2016   AKI (acute kidney injury) (HCC) 12/27/2018   Anemia of chronic disease    Arteriovenous fistula for hemodialysis in place, primary [facility]) 08/08/2017   Overview:  Left upper extremity  Formatting of this note might be different from the original. Left upper extremity   Arthritis    back, hands    Ascending aortic aneurysm (HCC) 10/23/2017   3.9 cm by echocardiogram   Backache 01/27/2011   BK viremia 02/07/2014   Body mass index (BMI) 36.0-36.9, adult 01/16/2020   Cerebral infarction (HCC) 08/08/2017   Cerebrovascular accident (CVA) (  HCC)    Cervical spondylosis 12/27/2012   Chronic back pain    radiculopathy and stenosis   Chronic ischemic heart disease    Severe LVH   Chronic kidney disease 2015   transplant   Colon polyp 2025   Congestive heart failure (CHF) (HCC) 2015   COVID-19 virus infection    CVA (cerebral infarction) 03/2013   Diabetes mellitus     Lantus  nightly ;type 2   Diabetes mellitus (HCC) 01/23/2011   Formatting of this note might be different from the original. Currently on Insulin    Digital mucinous cyst of finger of left hand 10/10/2016   Duodenal ulcer hemorrhagic    Dysphagia  08/22/2017   Dyspnea    Dyspnea on exertion 08/24/2016   PFT 12/28/16-mild restriction, mild reduction of diffusion.  No response to bronchodilator.  FVC 3.28/70%, FEV1 2.64/75%, ratio 0.81, TLC 77%, DLCO 66%.   Elevated blood-pressure reading, without diagnosis of hypertension 02/25/2019   Elevated LFTs    Elevated serum creatinine 10/24/2013   Essential (primary) hypertension 11/27/2018   takes Amlodipine  and Catapres  daily    dr Coleta, Loop Recorder - Lynwood Allred   Finger infection, fungal 11/22/2016   Fungal intestinal infection following organ transplantation 11/29/2016   GERD (gastroesophageal reflux disease)    GI bleed    5 units oblood    H/O hiatal hernia    Heart murmur    History of blood transfusion    History of colon polyps    History of kidney stones    passed   History of kidney transplant 01/24/2011   S/P renal transplant in 2015 at [facility]- followed by Dr Prescilla at [facility]   History of stroke 03/16/2013   HLD (hyperlipidemia) 05/02/2013   HTN (hypertension) 01/27/2011   Hx of cardiovascular stress test    a.  Lexiscan  Myoview  (05/2013):  No ischemia, EF 53%; Normal Study   Hyperkalemia 01/23/2011   Hyperlipidemia    takes Lovastatin daily   Hypertension    takes Amlodipine  and Catapres  daily    dr Coleta, Loop Recorder - James Allred   Hypertension associated with transplantation 12/03/2014   Hypomagnesemia 10/25/2013   Immunosuppression (HCC) 10/14/2013   Immunosuppressive management encounter following kidney transplant 10/21/2013   Insulin  dependent type 2 diabetes mellitus (HCC) 01/28/2014   Overview:  Currently on Insulin    Lumbago with sciatica, right side 12/17/2019   Lumbar post-laminectomy syndrome 05/13/2015   Mass of soft tissue of left upper extremity 10/18/2016   Melena    Movement disorder 03/16/2013   Nocturia    Obesity    OSA (obstructive sleep apnea) 05/02/2013   Unattended Home Sleep Test 10/24/12-AHI 24/hour,  desaturation to 81%.   Osteoarthritis (arthritis due to wear and tear of joints) 08/08/2017   Other chronic pain 12/17/2019   Pain at surgical site 10/01/2013   Peripheral edema    takes Lasix  daily   Personal history of COVID-19    Pneumonia 2021   Pneumonia due to COVID-19 virus    Preop cardiovascular exam 03/22/2013   Pulmonary hypertension, unspecified (HCC) 01/23/2017   Scapholunate dissociation of left wrist 10/18/2016   Sleep apnea    cpap , study in their home, 09/2102- [employer]            Spinal stenosis 01/16/2020   Formatting of this note might be different from the original. Operated twice with sciatica with disectomy of lumbars   Stroke (HCC) 2015   speech, rt arm weakness- 04/07/20 -  no residual    Swelling of lower extremity 06/30/2016   Thyroid  disease    Upper GI bleed 01/20/2019   URI, acute 03/07/2014   Urinary frequency     Past Surgical History:  Procedure Laterality Date   AV FISTULA PLACEMENT Left 04/03/2013   Procedure: ARTERIOVENOUS (AV) FISTULA CREATION- LEFT RADIOCEPHALIC VS BRACHIOCEPHALIC;  Surgeon: Redell LITTIE Door, MD;  [facility] OR;  Service: Vascular;  Laterality: Left;   AV FISTULA PLACEMENT Left 05/14/2013   Procedure: LEFT BRACHIOCEPHALIC ARTERIOVENOUS (AV) FISTULA CREATION;  Surgeon: Redell LITTIE Door, MD;  Location: [facility] OR;  Service: Vascular;  Laterality: Left;   BACK SURGERY  12/2012   2003- 1st back surgery & then 2014- fusion   COLONOSCOPY     ESOPHAGOGASTRODUODENOSCOPY     ESOPHAGOGASTRODUODENOSCOPY (EGD) WITH PROPOFOL  N/A 01/22/2019   Procedure: ESOPHAGOGASTRODUODENOSCOPY (EGD) WITH PROPOFOL ;  Surgeon: Albertus Gordy HERO, MD;  Location: [facility] ENDOSCOPY;  Service: Gastroenterology;  Laterality: N/A;   HEMORRHOID SURGERY     HEMOSTASIS CLIP PLACEMENT  01/22/2019   Procedure: HEMOSTASIS CLIP PLACEMENT;  Surgeon: Albertus Gordy HERO, MD;  Location: [facility] ENDOSCOPY;  Service: Gastroenterology;;   KIDNEY TRANSPLANT  10/01/2013   left knee surgery     LOOP RECORDER  IMPLANT  03/19/13   MDT LinQ implanted by Dr Kelsie for cryptogenic stroke   LOOP RECORDER IMPLANT N/A 03/19/2013   Procedure: LOOP RECORDER IMPLANT;  Surgeon: Lynwood JONETTA Kelsie, MD;  Location: [facility] CATH LAB;  Service: Cardiovascular;  Laterality: N/A;   LUMBAR FUSION  04/08/2020   Dr. Reyes Budge   NEPHRECTOMY TRANSPLANTED ORGAN     NEUROPLASTY / TRANSPOSITION MEDIAN NERVE AT CARPAL TUNNEL BILATERAL     right leg surgery     pin in place   right wrist surgery     SCLEROTHERAPY  01/22/2019   Procedure: SCLEROTHERAPY;  Surgeon: Albertus Gordy HERO, MD;  Location: [facility] ENDOSCOPY;  Service: Gastroenterology;;   TEE WITHOUT CARDIOVERSION N/A 03/19/2013   Procedure: TRANSESOPHAGEAL ECHOCARDIOGRAM (TEE);  Surgeon: Redell GORMAN Shallow, MD;  Location: [facility] ENDOSCOPY;  Service: Cardiovascular;  Laterality: N/A;   THYROID  SURGERY  10/13/2015   Performed at [facility] with surgical pathology revealed consistent with benign follicular nodule ([name] category II)    Current Outpatient Medications  Medication Sig Dispense Refill   amLODipine  (NORVASC ) 10 MG tablet Take 10 mg by mouth daily.     aspirin  81 MG EC tablet Take 81 mg by mouth daily. Swallow whole.     calcitRIOL  (ROCALTROL ) 0.25 MCG capsule Take 0.25 mcg by mouth every Monday, Tuesday, Wednesday, Thursday, and Friday.     cholecalciferol (VITAMIN D3) 25 MCG (1000 UNIT) tablet Take 1,000 Units by mouth daily.     colchicine 0.6 MG tablet Take 0.6 mg by mouth 2 (two) times daily.     febuxostat (ULORIC) 40 MG tablet Take 1 tablet by mouth daily.     ferrous sulfate 325 (65 FE) MG EC tablet Take 325 mg by mouth daily with breakfast.     furosemide  (LASIX ) 40 MG tablet Take 80 mg by mouth in the morning. 1 every afternoon     HYDROcodone -acetaminophen  (NORCO) 10-325 MG tablet Take 1-2 tablets by mouth every 4 (four) hours as needed for moderate pain. 30 tablet 0   Insulin  Glargine (BASAGLAR  KWIKPEN) 100 UNIT/ML Inject 49 Units into the skin at bedtime.     insulin   lispro (HUMALOG KWIKPEN) 100 UNIT/ML KwikPen Inject 5 Units into the skin See admin instructions. Sliding scale over  150 add 2 units Three times a day     labetalol  (NORMODYNE ) 100 MG tablet Take 0.5 tablets (50 mg total) by mouth 2 (two) times daily. 90 tablet 3   methimazole  (TAPAZOLE ) 5 MG tablet Take 2.5 mg by mouth daily.   3   Multiple Vitamin (MULTIVITAMIN WITH MINERALS) TABS tablet Take 1 tablet by mouth daily. Unknown strength     mycophenolate  (MYFORTIC ) 180 MG EC tablet Take 180 mg by mouth 2 (two) times daily.      ondansetron  (ZOFRAN ) 8 MG tablet Take 1 tablet (8 mg total) by mouth every 8 (eight) hours as needed for nausea or vomiting. 12 tablet 0   ONETOUCH ULTRA test strip 1 each by Other route as needed for other (Glucose check).     pantoprazole  (PROTONIX ) 40 MG tablet Take 1 tablet (40 mg total) by mouth 2 (two) times daily before a meal. 60 tablet 0   predniSONE  (DELTASONE ) 5 MG tablet Take 1 tablet (5 mg total) by mouth daily with breakfast.     St Johns Wort 300 MG CAPS Take 300 mg by mouth 3 (three) times daily.     sulfamethoxazole -trimethoprim  (BACTRIM ) 400-80 MG tablet Take 1 tablet by mouth every Monday, Wednesday, and Friday.     tacrolimus  (PROGRAF ) 1 MG capsule Take 2 mg by mouth 2 (two) times daily.  5   tamsulosin  (FLOMAX ) 0.4 MG CAPS capsule Take 0.4 mg by mouth at bedtime.     vitamin B-12 (CYANOCOBALAMIN ) 1000 MCG tablet Take 1,000 mcg by mouth 2 (two) times daily.      vitamin C (ASCORBIC ACID) 500 MG tablet Take 500 mg by mouth daily.     Zinc  30 MG CAPS Take 30 mg by mouth 2 (two) times daily.     atorvastatin  (LIPITOR) 40 MG tablet Take 40 mg by mouth at bedtime. (Patient not taking: Reported on 08/17/2023)     dapagliflozin propanediol (FARXIGA) 10 MG TABS tablet Take 10 mg by mouth daily.     Multiple Vitamins-Minerals (AIRBORNE GUMMIES) CHEW 1 gummy Orally twice daily     No current facility-administered medications for this visit.    Allergies as of  08/17/2023 - Review Complete 08/17/2023  Allergen Reaction Noted   Lisinopril Other (See Comments) 11/14/2012   Meloxicam Nausea And Vomiting 11/14/2012   Victoza [liraglutide] Diarrhea, Nausea And Vomiting, and Swelling 12/18/2012   Cyclobenzaprine   04/28/2020    Family History  Problem Relation Age of Onset   Hypertension Mother    Hyperlipidemia Father    Hypertension Father    Heart disease Father        before age 88   Renal Disease Father    Diabetes Brother    Hyperlipidemia Son     Review of Systems:    Constitutional: No weight loss, fever, chills, weakness or fatigue HEENT: Eyes: No change in vision               Ears, Nose, Throat:  No change in hearing or congestion Skin: No rash or itching Cardiovascular: No chest pain, chest pressure or palpitations   Respiratory: No SOB or cough Gastrointestinal: See HPI and otherwise negative Genitourinary: No dysuria or change in urinary frequency Neurological: No headache, dizziness or syncope Musculoskeletal: No new muscle or joint pain Hematologic: No bleeding or bruising Psychiatric: No history of depression or anxiety    Physical Exam:  Vital signs: BP (!) 130/50 (BP Location: Left Arm, Patient Position: Sitting)   Pulse 81  Ht 5' 11 (1.803 m)   Wt 230 lb 6 oz (104.5 kg)   BMI 32.13 kg/m   Constitutional:   Pleasant male appears to be in NAD, Well developed, Well nourished, alert and cooperative Throat: Oral cavity and pharynx without inflammation, swelling or lesion.  Respiratory: Respirations even and unlabored. Lungs clear to auscultation bilaterally.   No wheezes, crackles, or rhonchi.  Cardiovascular: Normal S1, S2. Regular rate and rhythm. No peripheral edema, cyanosis or pallor.  Gastrointestinal:  Soft, nondistended, nontender. No rebound or guarding. Normal bowel sounds. No appreciable masses or hepatomegaly. Rectal:  Not performed.  Msk:  Symmetrical without gross deformities. Without edema, no  deformity or joint abnormality.  Neurologic:  Alert and  oriented x4;  grossly normal neurologically.  Skin:   Dry and intact without significant lesions or rashes.  RELEVANT LABS AND IMAGING: CBC    Latest Ref Rng & Units 07/28/2023    5:09 PM 07/28/2023    4:25 PM 03/31/2023    4:02 PM  CBC  WBC 4.0 - 10.5 K/uL  8.6  7.2   Hemoglobin 13.0 - 17.0 g/dL 86.3  86.7  86.6   Hematocrit 39.0 - 52.0 % 40.0  40.5  41.6   Platelets 150 - 400 K/uL  128  110      CMP     Latest Ref Rng & Units 07/28/2023    7:53 PM 07/28/2023    5:09 PM 07/28/2023    4:25 PM  CMP  Glucose 70 - 99 mg/dL  832  830   BUN 8 - 23 mg/dL  40  32   Creatinine 9.38 - 1.24 mg/dL  7.49  7.56   Sodium 864 - 145 mmol/L  141  140   Potassium 3.5 - 5.1 mmol/L 5.0  5.7  5.7   Chloride 98 - 111 mmol/L  112  110   CO2 22 - 32 mmol/L   20   Calcium  8.9 - 10.3 mg/dL   9.2   Total Protein 6.5 - 8.1 g/dL   6.9   Total Bilirubin 0.0 - 1.2 mg/dL   0.9   Alkaline Phos 38 - 126 U/L   89   AST 15 - 41 U/L   66   ALT 0 - 44 U/L   47      Lab Results  Component Value Date   TSH 0.013 (L) 07/26/2017  Imaging:  07/28/23 CTAP IMPRESSION: Rim enhancing fluid collection in the right anterolateral abdominal wall, slightly enlarged to prior and significantly enlarged to 2017. Considering the interval growth and while enhancement recommend further assessment with tissue sampling (rule out infection).   Small amount of fluid within the peritoneal cavity with suggestion possible adhesions. Atrophic native end-stage kidneys, similar to prior.   Other findings are grossly stable to prior.  03/2023 CTAP IMPRESSION: No acute abnormality noted.   Stable right renal transplant.   Stable postoperative fluid collection in the right anterolateral abdominal wall.  Assessment: Encounter Diagnoses  Name Primary?   Elevated LFTs Yes   Thrombocytopenia (HCC)    Atrophic pancreas    Altered bowel habits    Loss of weight    Poor  appetite        73 year old male patient who presents for evaluation of elevated LFTs.  It is noted his ALT has been elevated since April 2023---43>45>71>60>47>, history of elevation back in 12/20 and his AST elevated since October 2024---67>73>66, history of elevation back in 12/2018.  Normal total bilirubin  and lipase. Chronic Thrombocytopenia noted since 1/21. Noted to be due to suppressive drugs. Hgb 13.2 no anemia. patient has had 2 CT scans this year in March and in July with no indication of liver disease.  No splenomegaly.  No ascites.  I did go back into his records and CTAP from 06/2017 showed hepatic steatosis. Also US  abdomen RUQ 12/2018 with hepatic steatosis. Then no imaging on file until recent CT scans.  Medications patient is on that Morgon Pamer increase LFTs include: statin Lipitor, PPI therapy Pantoprazole , antibiotics (bactrim ), and Prograf . Imaging does mention fatty atrophic changes of the pancreatic head and neck.  Patient has history of diabetes and does make mention of pancreatic issues in his mother.  Given the most recent weight loss with altered bowel habit changes we will go ahead and proceed with stool elastase and H. pylori stool test.  Will discuss case with Dr. Federico to see if additional imaging of liver and/or pancreas is warranted pending test results.  We also discussed pursuing upper endoscopy if negative workup to further evaluate.  Encourage patient to consume 2-3 protein shakes per day.  Patient up-to-date on colonoscopy but will obtain records to review.  Patient did mention he had several polyps on the last few colonoscopies, Magdiel Bartles consider genetic testing in future for Lynch syndrome.   Plan: - Collect stool elastase  - collect h pylori stool test , off PPI therapy 2 weeks -defer imaging to Dr. Federico -Recommend protein shakes BID-TID -consider endoscopy procedure if negative workup -request records for colonoscopy    Thank you for the courtesy of this consult. Please  call me with any questions or concerns.   Kaylub Detienne, FNP-C [facility] 08/17/2023, 12:16 PM  Cc: Marvene Prentice SAUNDERS, FNP

## 2023-08-17 NOTE — Patient Instructions (Addendum)
 Weight loss Protein shake two to three times a day  _______________________________________________________  If your blood pressure at your visit was 140/90 or greater, please contact your primary care physician to follow up on this.  _______________________________________________________  If you are age 72 or older, your body mass index should be between 23-30. Your Body mass index is 32.13 kg/m. If this is out of the aforementioned range listed, please consider follow up with your Primary Care Provider.  If you are age 61 or younger, your body mass index should be between 19-25. Your Body mass index is 32.13 kg/m. If this is out of the aformentioned range listed, please consider follow up with your Primary Care Provider.   ________________________________________________________  The [facility] providers would like to encourage you to use MYCHART to communicate with providers for non-urgent requests or questions.  Due to long hold times on the telephone, sending your provider a message by [facility] may be a faster and more efficient way to get a response.  Please allow 48 business hours for a response.  Please remember that this is for non-urgent requests.  _______________________________________________________  [facility] is using a team-based approach to care.  Your team is made up of your doctor and two to three APPS. Our APPS (Nurse Practitioners and Physician Assistants) work with your physician to ensure care continuity for you. They are fully qualified to address your health concerns and develop a treatment plan. They communicate directly with your gastroenterologist to care for you. Seeing the Advanced Practice Practitioners on your physician's team can help you by facilitating care more promptly, often allowing for earlier appointments, access to diagnostic testing, procedures, and other specialty referrals.   Thank you for trusting me with your gastrointestinal  care. Deanna May, FNP-C

## 2023-08-18 ENCOUNTER — Telehealth: Payer: Self-pay

## 2023-08-18 ENCOUNTER — Telehealth: Payer: Self-pay | Admitting: Gastroenterology

## 2023-08-18 NOTE — Telephone Encounter (Signed)
Patient returning call to schedule procedurePlease advise

## 2023-08-18 NOTE — Telephone Encounter (Signed)
-----   Message from [name] sent at 08/17/2023  5:29 PM EDT ----- Karna- Dr federico wants  U/S liver with elastography for elevated LFTS and thrombocytopenia. May want to callt hem first to see where they want it done they leave out of town.  Cathryne, NP ----- Message ----- From: [name], MD Sent: 08/17/2023   4:52 PM EDT To: [name], NP

## 2023-08-18 NOTE — Telephone Encounter (Signed)
 Inbound call from patients wife stating she would like to speak to nurse in regards to labs they have to get done. Patients wife would like to know if he has to without on his medication Protonix  before lab work Requesting a call back   Please advise  Thank you

## 2023-08-18 NOTE — Telephone Encounter (Signed)
 Returned call to wife & advised that patient will need to be off PPI for 2 weeks prior to submitting h pylori stool sample.

## 2023-08-21 ENCOUNTER — Other Ambulatory Visit: Payer: Self-pay

## 2023-08-21 DIAGNOSIS — D696 Thrombocytopenia, unspecified: Secondary | ICD-10-CM

## 2023-08-21 DIAGNOSIS — R7989 Other specified abnormal findings of blood chemistry: Secondary | ICD-10-CM

## 2023-08-21 NOTE — Telephone Encounter (Signed)
 Spoke with patient and scheduled US  Liver Elastography. Patient verbalized understanding.

## 2023-08-21 NOTE — Progress Notes (Signed)
Imaging

## 2023-08-23 DIAGNOSIS — M48062 Spinal stenosis, lumbar region with neurogenic claudication: Secondary | ICD-10-CM | POA: Diagnosis not present

## 2023-08-23 DIAGNOSIS — M25551 Pain in right hip: Secondary | ICD-10-CM | POA: Diagnosis not present

## 2023-08-29 ENCOUNTER — Ambulatory Visit (HOSPITAL_COMMUNITY)
Admission: RE | Admit: 2023-08-29 | Discharge: 2023-08-29 | Disposition: A | Discharge status: Home / Self Care | Source: Ambulatory Visit | Attending: Gastroenterology | Admitting: Gastroenterology

## 2023-08-29 DIAGNOSIS — R7989 Other specified abnormal findings of blood chemistry: Secondary | ICD-10-CM | POA: Insufficient documentation

## 2023-08-29 DIAGNOSIS — D696 Thrombocytopenia, unspecified: Secondary | ICD-10-CM | POA: Insufficient documentation

## 2023-09-04 ENCOUNTER — Other Ambulatory Visit: Payer: Self-pay | Admitting: Gastroenterology

## 2023-09-04 ENCOUNTER — Other Ambulatory Visit

## 2023-09-04 DIAGNOSIS — R634 Abnormal weight loss: Secondary | ICD-10-CM | POA: Diagnosis not present

## 2023-09-05 ENCOUNTER — Ambulatory Visit: Payer: Self-pay | Admitting: Gastroenterology

## 2023-09-06 LAB — PANCREATIC ELASTASE, FECAL: Pancreatic Elastase, Fecal: 291 ug Elast./g (ref >200)

## 2023-09-07 ENCOUNTER — Ambulatory Visit: Payer: Self-pay | Admitting: Gastroenterology

## 2023-09-10 DIAGNOSIS — E1169 Type 2 diabetes mellitus with other specified complication: Secondary | ICD-10-CM | POA: Diagnosis not present

## 2023-09-10 DIAGNOSIS — E1165 Type 2 diabetes mellitus with hyperglycemia: Secondary | ICD-10-CM | POA: Diagnosis not present

## 2023-09-10 DIAGNOSIS — I2510 Atherosclerotic heart disease of native coronary artery without angina pectoris: Secondary | ICD-10-CM | POA: Diagnosis not present

## 2023-09-10 DIAGNOSIS — E785 Hyperlipidemia, unspecified: Secondary | ICD-10-CM | POA: Diagnosis not present

## 2023-09-12 DIAGNOSIS — D631 Anemia in chronic kidney disease: Secondary | ICD-10-CM | POA: Diagnosis not present

## 2023-09-12 DIAGNOSIS — R809 Proteinuria, unspecified: Secondary | ICD-10-CM | POA: Diagnosis not present

## 2023-09-12 DIAGNOSIS — I10 Essential (primary) hypertension: Secondary | ICD-10-CM | POA: Diagnosis not present

## 2023-09-12 DIAGNOSIS — N189 Chronic kidney disease, unspecified: Secondary | ICD-10-CM | POA: Diagnosis not present

## 2023-09-12 DIAGNOSIS — R634 Abnormal weight loss: Secondary | ICD-10-CM | POA: Diagnosis not present

## 2023-09-12 DIAGNOSIS — B348 Other viral infections of unspecified site: Secondary | ICD-10-CM | POA: Diagnosis not present

## 2023-09-12 DIAGNOSIS — R7989 Other specified abnormal findings of blood chemistry: Secondary | ICD-10-CM | POA: Diagnosis not present

## 2023-09-12 DIAGNOSIS — M109 Gout, unspecified: Secondary | ICD-10-CM | POA: Diagnosis not present

## 2023-09-12 DIAGNOSIS — N2581 Secondary hyperparathyroidism of renal origin: Secondary | ICD-10-CM | POA: Diagnosis not present

## 2023-09-12 DIAGNOSIS — Z940 Kidney transplant status: Secondary | ICD-10-CM | POA: Diagnosis not present

## 2023-09-12 DIAGNOSIS — E1129 Type 2 diabetes mellitus with other diabetic kidney complication: Secondary | ICD-10-CM | POA: Diagnosis not present

## 2023-09-12 LAB — SPECIMEN STATUS REPORT

## 2023-09-13 LAB — SPECIMEN STATUS REPORT

## 2023-09-13 LAB — H. PYLORI ANTIGEN, STOOL: H pylori Ag, Stl: NEGATIVE

## 2023-09-14 ENCOUNTER — Ambulatory Visit: Payer: Self-pay | Admitting: Gastroenterology

## 2023-10-03 ENCOUNTER — Ambulatory Visit: Attending: Cardiology | Admitting: Cardiology

## 2023-10-03 ENCOUNTER — Encounter: Payer: Self-pay | Admitting: Cardiology

## 2023-10-03 VITALS — BP 136/48 | HR 82 | Ht 71.0 in | Wt 219.6 lb

## 2023-10-03 DIAGNOSIS — Z940 Kidney transplant status: Secondary | ICD-10-CM

## 2023-10-03 DIAGNOSIS — E782 Mixed hyperlipidemia: Secondary | ICD-10-CM

## 2023-10-03 DIAGNOSIS — E0800 Diabetes mellitus due to underlying condition with hyperosmolarity without nonketotic hyperglycemic-hyperosmolar coma (NKHHC): Secondary | ICD-10-CM | POA: Diagnosis not present

## 2023-10-03 DIAGNOSIS — I7121 Aneurysm of the ascending aorta, without rupture: Secondary | ICD-10-CM | POA: Diagnosis not present

## 2023-10-03 DIAGNOSIS — G4733 Obstructive sleep apnea (adult) (pediatric): Secondary | ICD-10-CM

## 2023-10-03 NOTE — Progress Notes (Unsigned)
 Cardiology Office Note:    Date:  10/03/2023   ID:  Shields, Pautz 1950/09/17, MRN [id number]  PCP:  Marvene Prentice SAUNDERS, FNP  Cardiologist:  Lamar Fitch, MD    Referring MD: Marvene Prentice SAUNDERS, FNP   No chief complaint on file.   History of Present Illness:    Jon [name] is a 73 y.o. male past medical history significant for kidney spinal transplant that he received 10 years ago, ascending aortic aneurysm measuring 40 mm, aortic stenosis which is mild, essential hypertension, diabetes, pulmonary hypertension, obstructive sleep apnea. Comes today to months for follow-up.  Overall doing well.  Denies having chest pain tightness squeezing pressure burning chest no palpitation dizziness swelling of lower extremities.  He has been having diarrhea for the last 3 months and he lost close to 30 pounds.  Likely things are getting better and subsided to ready  Past Medical History:  Diagnosis Date   Abnormal gait    Acute blood loss anemia    Acute renal failure superimposed on stage 4 chronic kidney disease (HCC) 06/04/2017   Acute respiratory failure with hypoxia (HCC) 01/04/2019   Covid pneumonia Dec, 2020   Acute right ankle pain 12/08/2016   AKI (acute kidney injury) 12/27/2018   Anemia of chronic disease    Arteriovenous fistula for hemodialysis in place, primary 08/08/2017   Overview:  Left upper extremity  Formatting of this note might be different from the original. Left upper extremity   Arthritis    back, hands    Ascending aortic aneurysm 10/23/2017   3.9 cm by echocardiogram   Backache 01/27/2011   BK viremia 02/07/2014   Body mass index (BMI) 36.0-36.9, adult 01/16/2020   Cerebral infarction (HCC) 08/08/2017   Cerebrovascular accident (CVA) (HCC)    Cervical spondylosis 12/27/2012   Chronic back pain    radiculopathy and stenosis   Chronic ischemic heart disease    Severe LVH   Chronic kidney disease 2015   transplant   Colon polyp 2025    Congestive heart failure (CHF) (HCC) 2015   COVID-19 virus infection    CVA (cerebral infarction) 03/2013   Diabetes mellitus     Lantus  nightly ;type 2   Diabetes mellitus (HCC) 01/23/2011   Formatting of this note might be different from the original. Currently on Insulin    Digital mucinous cyst of finger of left hand 10/10/2016   Duodenal ulcer hemorrhagic    Dysphagia 08/22/2017   Dyspnea    Dyspnea on exertion 08/24/2016   PFT 12/28/16-mild restriction, mild reduction of diffusion.  No response to bronchodilator.  FVC 3.28/70%, FEV1 2.64/75%, ratio 0.81, TLC 77%, DLCO 66%.   Elevated blood-pressure reading, without diagnosis of hypertension 02/25/2019   Elevated LFTs    Elevated serum creatinine 10/24/2013   Essential (primary) hypertension 11/27/2018   takes Amlodipine  and Catapres  daily    dr Coleta, Loop Recorder - Lynwood Rakers   Finger infection, fungal 11/22/2016   Fungal intestinal infection following organ transplantation 11/29/2016   GERD (gastroesophageal reflux disease)    GI bleed    5 units oblood    H/O hiatal hernia    Heart murmur    History of blood transfusion    History of colon polyps    History of kidney stones    passed   History of kidney transplant 01/24/2011   S/P renal transplant in 2015 at [facility]- followed by Dr Prescilla at [facility]   History of stroke 03/16/2013  HLD (hyperlipidemia) 05/02/2013   HTN (hypertension) 01/27/2011   Hx of cardiovascular stress test    a.  Lexiscan  Myoview  (05/2013):  No ischemia, EF 53%; Normal Study   Hyperkalemia 01/23/2011   Hyperlipidemia    takes Lovastatin daily   Hypertension    takes Amlodipine  and Catapres  daily    dr Coleta, Loop Recorder - James Allred   Hypertension associated with transplantation 12/03/2014   Hypomagnesemia 10/25/2013   Immunosuppression 10/14/2013   Immunosuppressive management encounter following kidney transplant 10/21/2013   Insulin  dependent type 2 diabetes  mellitus (HCC) 01/28/2014   Overview:  Currently on Insulin    Lumbago with sciatica, right side 12/17/2019   Lumbar post-laminectomy syndrome 05/13/2015   Mass of soft tissue of left upper extremity 10/18/2016   Melena    Movement disorder 03/16/2013   Nocturia    Obesity    OSA (obstructive sleep apnea) 05/02/2013   Unattended Home Sleep Test 10/24/12-AHI 24/hour, desaturation to 81%.   Osteoarthritis (arthritis due to wear and tear of joints) 08/08/2017   Other chronic pain 12/17/2019   Pain at surgical site 10/01/2013   Peripheral edema    takes Lasix  daily   Personal history of COVID-19    Pneumonia 2021   Pneumonia due to COVID-19 virus    Preop cardiovascular exam 03/22/2013   Pulmonary hypertension, unspecified (HCC) 01/23/2017   Scapholunate dissociation of left wrist 10/18/2016   Sleep apnea    cpap , study in their home, 09/2102- [employer]            Spinal stenosis 01/16/2020   Formatting of this note might be different from the original. Operated twice with sciatica with disectomy of lumbars   Stroke (HCC) 2015   speech, rt arm weakness- 04/07/20 - no residual    Swelling of lower extremity 06/30/2016   Thyroid  disease    Upper GI bleed 01/20/2019   URI, acute 03/07/2014   Urinary frequency     Past Surgical History:  Procedure Laterality Date   AV FISTULA PLACEMENT Left 04/03/2013   Procedure: ARTERIOVENOUS (AV) FISTULA CREATION- LEFT RADIOCEPHALIC VS BRACHIOCEPHALIC;  Surgeon: Redell LITTIE Door, MD;  Location: [facility] OR;  Service: Vascular;  Laterality: Left;   AV FISTULA PLACEMENT Left 05/14/2013   Procedure: LEFT BRACHIOCEPHALIC ARTERIOVENOUS (AV) FISTULA CREATION;  Surgeon: Redell LITTIE Door, MD;  Location: [facility] OR;  Service: Vascular;  Laterality: Left;   BACK SURGERY  12/2012   2003- 1st back surgery & then 2014- fusion   COLONOSCOPY     ESOPHAGOGASTRODUODENOSCOPY     ESOPHAGOGASTRODUODENOSCOPY (EGD) WITH PROPOFOL  N/A 01/22/2019   Procedure: ESOPHAGOGASTRODUODENOSCOPY (EGD)  WITH PROPOFOL ;  Surgeon: Albertus Gordy HERO, MD;  Location: [facility] ENDOSCOPY;  Service: Gastroenterology;  Laterality: N/A;   HEMORRHOID SURGERY     HEMOSTASIS CLIP PLACEMENT  01/22/2019   Procedure: HEMOSTASIS CLIP PLACEMENT;  Surgeon: Albertus Gordy HERO, MD;  Location: [facility] ENDOSCOPY;  Service: Gastroenterology;;   KIDNEY TRANSPLANT  10/01/2013   left knee surgery     LOOP RECORDER IMPLANT  03/19/13   MDT LinQ implanted by Dr Kelsie for cryptogenic stroke   LOOP RECORDER IMPLANT N/A 03/19/2013   Procedure: LOOP RECORDER IMPLANT;  Surgeon: Lynwood JONETTA Kelsie, MD;  Location: [facility] CATH LAB;  Service: Cardiovascular;  Laterality: N/A;   LUMBAR FUSION  04/08/2020   Dr. Reyes Budge   NEPHRECTOMY TRANSPLANTED ORGAN     NEUROPLASTY / TRANSPOSITION MEDIAN NERVE AT CARPAL TUNNEL BILATERAL     right leg surgery  pin in place   right wrist surgery     SCLEROTHERAPY  01/22/2019   Procedure: SCLEROTHERAPY;  Surgeon: Albertus Gordy HERO, MD;  Location: [facility] ENDOSCOPY;  Service: Gastroenterology;;   TEE WITHOUT CARDIOVERSION N/A 03/19/2013   Procedure: TRANSESOPHAGEAL ECHOCARDIOGRAM (TEE);  Surgeon: Redell GORMAN Shallow, MD;  Location: [facility] ENDOSCOPY;  Service: Cardiovascular;  Laterality: N/A;   THYROID  SURGERY  10/13/2015   Performed at [facility] with surgical pathology revealed consistent with benign follicular nodule ([name] category II)    Current Medications: Current Meds  Medication Sig   amLODipine  (NORVASC ) 5 MG tablet Take 5 mg by mouth daily.   aspirin  81 MG EC tablet Take 81 mg by mouth daily. Swallow whole.   calcitRIOL  (ROCALTROL ) 0.25 MCG capsule Take 0.25 mcg by mouth every Monday, Tuesday, Wednesday, Thursday, and Friday.   cholecalciferol (VITAMIN D3) 25 MCG (1000 UNIT) tablet Take 1,000 Units by mouth daily.   colchicine 0.6 MG tablet Take 0.6 mg by mouth 2 (two) times daily.   dapagliflozin propanediol (FARXIGA) 10 MG TABS tablet Take 10 mg by mouth daily.   febuxostat (ULORIC) 40 MG tablet Take 1 tablet by mouth  daily.   ferrous sulfate 325 (65 FE) MG EC tablet Take 325 mg by mouth daily with breakfast.   furosemide  (LASIX ) 40 MG tablet Take 80 mg by mouth in the morning. 1 every afternoon   HYDROcodone -acetaminophen  (NORCO) 10-325 MG tablet Take 1-2 tablets by mouth every 4 (four) hours as needed for moderate pain.   Insulin  Glargine (BASAGLAR  KWIKPEN) 100 UNIT/ML Inject 49 Units into the skin at bedtime.   insulin  lispro (HUMALOG KWIKPEN) 100 UNIT/ML KwikPen Inject 5 Units into the skin See admin instructions. Sliding scale over 150 add 2 units Three times a day   labetalol  (NORMODYNE ) 100 MG tablet Take 0.5 tablets (50 mg total) by mouth 2 (two) times daily.   methimazole  (TAPAZOLE ) 5 MG tablet Take 2.5 mg by mouth daily.    Multiple Vitamin (MULTIVITAMIN WITH MINERALS) TABS tablet Take 1 tablet by mouth daily. Unknown strength   Multiple Vitamins-Minerals (AIRBORNE GUMMIES) CHEW 1 gummy Orally twice daily   mycophenolate  (MYFORTIC ) 180 MG EC tablet Take 180 mg by mouth 2 (two) times daily.    ondansetron  (ZOFRAN ) 8 MG tablet Take 1 tablet (8 mg total) by mouth every 8 (eight) hours as needed for nausea or vomiting.   ONETOUCH ULTRA test strip 1 each by Other route as needed for other (Glucose check).   pantoprazole  (PROTONIX ) 40 MG tablet Take 1 tablet (40 mg total) by mouth 2 (two) times daily before a meal.   predniSONE  (DELTASONE ) 5 MG tablet Take 1 tablet (5 mg total) by mouth daily with breakfast.   St Johns Wort 300 MG CAPS Take 300 mg by mouth 3 (three) times daily.   sulfamethoxazole -trimethoprim  (BACTRIM ) 400-80 MG tablet Take 1 tablet by mouth every Monday, Wednesday, and Friday.   tacrolimus  (PROGRAF ) 1 MG capsule Take 2 mg by mouth 2 (two) times daily.   tamsulosin  (FLOMAX ) 0.4 MG CAPS capsule Take 0.4 mg by mouth at bedtime.   vitamin B-12 (CYANOCOBALAMIN ) 1000 MCG tablet Take 1,000 mcg by mouth 2 (two) times daily.    vitamin C (ASCORBIC ACID) 500 MG tablet Take 500 mg by mouth daily.    Zinc  30 MG CAPS Take 30 mg by mouth 2 (two) times daily.     Allergies:   Lisinopril, Meloxicam, Victoza [liraglutide], and Cyclobenzaprine    Social History   Socioeconomic History  Marital status: Married    Spouse name: agnes   Number of children: 5   Years of education: 8th   Highest education level: Not on file  Occupational History   Occupation: disabled  Tobacco Use   Smoking status: Former    Current packs/day: 0.00    Average packs/day: 0.5 packs/day for 40.0 years (20.0 ttl pk-yrs)    Types: Cigarettes    Start date: 12/27/1972    Quit date: 12/27/2012    Years since quitting: 10.7   Smokeless tobacco: Never  Vaping Use   Vaping status: Never Used  Substance and Sexual Activity   Alcohol use: No    Alcohol/week: 0.0 standard drinks of alcohol   Drug use: No   Sexual activity: Yes  Other Topics Concern   Not on file  Social History Narrative   Patient lives at home with his wife ETTER Gouge).  One story home with basement.   Patient is disabled.   Education 8th grade.   Right handed.   Caffeine One cup of coffee daily.   Retired Naval architect.   5 children.   Social Drivers of Corporate investment banker Strain: Not on file  Food Insecurity: No Food Insecurity (01/28/2019)   Hunger Vital Sign    Worried About Running Out of Food in the Last Year: Never true    Ran Out of Food in the Last Year: Never true  Transportation Needs: No Transportation Needs (01/28/2019)   PRAPARE - Administrator, Civil Service (Medical): No    Lack of Transportation (Non-Medical): No  Physical Activity: Not on file  Stress: Not on file  Social Connections: Not on file     Family History: The patient's family history includes Diabetes in his brother; Heart disease in his father; Hyperlipidemia in his father and son; Hypertension in his father and mother; Renal Disease in his father. ROS:   Please see the history of present illness.    All 14 point review of  systems negative except as described per history of present illness  EKGs/Labs/Other Studies Reviewed:         Recent Labs: 07/28/2023: ALT 47; BUN 40; Creatinine, Ser 2.50; Hemoglobin 13.6; Platelets 128; Potassium 5.0; Sodium 141  Recent Lipid Panel    Component Value Date/Time   CHOL 137 01/02/2017 0343   TRIG 113 01/04/2019 0011   HDL 38 (L) 01/02/2017 0343   CHOLHDL 3.6 01/02/2017 0343   VLDL 16 01/02/2017 0343   LDLCALC 83 01/02/2017 0343    Physical Exam:    VS:  BP (!) 136/48   Pulse 82   Ht 5' 11 (1.803 m)   Wt 219 lb 9.6 oz (99.6 kg)   SpO2 96%   BMI 30.63 kg/m     Wt Readings from Last 3 Encounters:  10/03/23 219 lb 9.6 oz (99.6 kg)  08/17/23 230 lb 6 oz (104.5 kg)  07/28/23 254 lb 3.1 oz (115.3 kg)     GEN:  Well nourished, well developed in no acute distress HEENT: Normal NECK: No JVD; No carotid bruits LYMPHATICS: No lymphadenopathy CARDIAC: RRR, systolic ejection murmur grade 2/6 posterior right upper portion of the sternum, no rubs, no gallops RESPIRATORY:  Clear to auscultation without rales, wheezing or rhonchi  ABDOMEN: Soft, non-tender, non-distended MUSCULOSKELETAL:  No edema; No deformity  SKIN: Warm and dry LOWER EXTREMITIES: no swelling NEUROLOGIC:  Alert and oriented x 3 PSYCHIATRIC:  Normal affect   ASSESSMENT:    1.  History of kidney transplant   2. Mixed hyperlipidemia   3. Diabetes mellitus due to underlying condition with hyperosmolarity without coma, without long-term current use of insulin  (HCC)   4. OSA (obstructive sleep apnea)   5. Aneurysm of ascending aorta without rupture    PLAN:    In order of problems listed above:  History of kidney transplant.  Noted, creatinine 2.6.  Yesterday he actually had 10 years anniversary of his kidney transplant. Mixed dyslipidemia, I did review K PN which show me his total cholesterol 221 HDL 37, LDL 137.  He is on Lipitor 40.  Will make arrangements for his fasting lipid profile to be  repeated. Aneurysm of the ascending aorta stable. Diabetes followed by internal medicine team Aortic stenosis only mild, continue monitoring   Medication Adjustments/Labs and Tests Ordered: Current medicines are reviewed at length with the patient today.  Concerns regarding medicines are outlined above.  No orders of the defined types were placed in this encounter.  Medication changes: No orders of the defined types were placed in this encounter.   Signed, Lamar DOROTHA Fitch, MD, [facility] 10/03/2023 3:16 PM    [facility]

## 2023-10-03 NOTE — Patient Instructions (Signed)

## 2023-10-10 DIAGNOSIS — Z794 Long term (current) use of insulin: Secondary | ICD-10-CM | POA: Diagnosis not present

## 2023-10-10 DIAGNOSIS — Z5181 Encounter for therapeutic drug level monitoring: Secondary | ICD-10-CM | POA: Diagnosis not present

## 2023-10-10 DIAGNOSIS — D649 Anemia, unspecified: Secondary | ICD-10-CM | POA: Diagnosis not present

## 2023-10-10 DIAGNOSIS — D509 Iron deficiency anemia, unspecified: Secondary | ICD-10-CM | POA: Diagnosis not present

## 2023-10-10 DIAGNOSIS — Z9989 Dependence on other enabling machines and devices: Secondary | ICD-10-CM | POA: Diagnosis not present

## 2023-10-10 DIAGNOSIS — D849 Immunodeficiency, unspecified: Secondary | ICD-10-CM | POA: Diagnosis not present

## 2023-10-10 DIAGNOSIS — R634 Abnormal weight loss: Secondary | ICD-10-CM | POA: Diagnosis not present

## 2023-10-10 DIAGNOSIS — E538 Deficiency of other specified B group vitamins: Secondary | ICD-10-CM | POA: Diagnosis not present

## 2023-10-10 DIAGNOSIS — Z79621 Long term (current) use of calcineurin inhibitor: Secondary | ICD-10-CM | POA: Diagnosis not present

## 2023-10-10 DIAGNOSIS — E1169 Type 2 diabetes mellitus with other specified complication: Secondary | ICD-10-CM | POA: Diagnosis not present

## 2023-10-10 DIAGNOSIS — E0590 Thyrotoxicosis, unspecified without thyrotoxic crisis or storm: Secondary | ICD-10-CM | POA: Diagnosis not present

## 2023-10-10 DIAGNOSIS — I7121 Aneurysm of the ascending aorta, without rupture: Secondary | ICD-10-CM | POA: Diagnosis not present

## 2023-10-10 DIAGNOSIS — E785 Hyperlipidemia, unspecified: Secondary | ICD-10-CM | POA: Diagnosis not present

## 2023-10-10 DIAGNOSIS — Z4822 Encounter for aftercare following kidney transplant: Secondary | ICD-10-CM | POA: Diagnosis not present

## 2023-10-10 DIAGNOSIS — Z940 Kidney transplant status: Secondary | ICD-10-CM | POA: Diagnosis not present

## 2023-10-10 DIAGNOSIS — E1165 Type 2 diabetes mellitus with hyperglycemia: Secondary | ICD-10-CM | POA: Diagnosis not present

## 2023-10-10 DIAGNOSIS — E119 Type 2 diabetes mellitus without complications: Secondary | ICD-10-CM | POA: Diagnosis not present

## 2023-10-10 DIAGNOSIS — I2510 Atherosclerotic heart disease of native coronary artery without angina pectoris: Secondary | ICD-10-CM | POA: Diagnosis not present

## 2023-10-10 DIAGNOSIS — E559 Vitamin D deficiency, unspecified: Secondary | ICD-10-CM | POA: Diagnosis not present

## 2023-10-10 DIAGNOSIS — Z0000 Encounter for general adult medical examination without abnormal findings: Secondary | ICD-10-CM | POA: Diagnosis not present

## 2023-10-10 DIAGNOSIS — R7989 Other specified abnormal findings of blood chemistry: Secondary | ICD-10-CM | POA: Diagnosis not present

## 2023-10-10 DIAGNOSIS — G4733 Obstructive sleep apnea (adult) (pediatric): Secondary | ICD-10-CM | POA: Diagnosis not present

## 2023-10-10 DIAGNOSIS — R600 Localized edema: Secondary | ICD-10-CM | POA: Diagnosis not present

## 2023-10-10 DIAGNOSIS — I10 Essential (primary) hypertension: Secondary | ICD-10-CM | POA: Diagnosis not present

## 2023-10-10 DIAGNOSIS — R1114 Bilious vomiting: Secondary | ICD-10-CM | POA: Diagnosis not present

## 2023-10-12 NOTE — Progress Notes (Signed)
 [facility] at [facility] [address] [place],  [place]  [address] [phone]  Clinic Day:  10/13/2023  Referring physician: Dottie Norleen PHEBE PONCE, MD  HISTORY OF PRESENT ILLNESS:  The patient is a 73 y.o. male with anemia secondary to iron deficiency and chronic renal insufficiency.  In the past, IV iron was effective in replenishing his iron stores and improving his hemoglobin.  He comes in today to reassess his hemoglobin.  Since his last visit, the patient has been doing fine.  He denies having increased fatigue or any overt forms of blood loss which concern him for progressive anemia.  PHYSICAL EXAM:  Blood pressure (!) 158/60, pulse 77, temperature 98 F (36.7 C), temperature source Oral, resp. rate 16, height 5' 11 (1.803 m), weight 221 lb 6.4 oz (100.4 kg), SpO2 100%. Wt Readings from Last 3 Encounters:  10/13/23 221 lb 6.4 oz (100.4 kg)  10/03/23 219 lb 9.6 oz (99.6 kg)  08/17/23 230 lb 6 oz (104.5 kg)   Body mass index is 30.88 kg/m. Performance status (ECOG): 1 - Symptomatic but completely ambulatory Physical Exam Constitutional:      General: He is not in acute distress.    Appearance: Normal appearance. He is normal weight.  HENT:     Head: Normocephalic and atraumatic.  Eyes:     General: No scleral icterus.    Extraocular Movements: Extraocular movements intact.     Conjunctiva/sclera: Conjunctivae normal.     Pupils: Pupils are equal, round, and reactive to light.  Cardiovascular:     Rate and Rhythm: Normal rate and regular rhythm.     Pulses: Normal pulses.     Heart sounds: Normal heart sounds. No murmur heard.    No friction rub. No gallop.  Pulmonary:     Effort: Pulmonary effort is normal. No respiratory distress.     Breath sounds: Normal breath sounds.  Abdominal:     General: Bowel sounds are normal. There is no distension.     Palpations: Abdomen is soft. There is no hepatomegaly, splenomegaly or mass.     Tenderness: There  is no abdominal tenderness.  Musculoskeletal:        General: Normal range of motion.     Cervical back: Normal range of motion and neck supple.     Right lower leg: No edema.     Left lower leg: No edema.  Lymphadenopathy:     Cervical: No cervical adenopathy.  Skin:    General: Skin is warm and dry.  Neurological:     General: No focal deficit present.     Mental Status: He is alert and oriented to person, place, and time. Mental status is at baseline.  Psychiatric:        Mood and Affect: Mood normal.        Behavior: Behavior normal.        Thought Content: Thought content normal.        Judgment: Judgment normal.    LABS:      Latest Ref Rng & Units 10/13/2023   10:59 AM 07/28/2023    5:09 PM 07/28/2023    4:25 PM  CBC  WBC 4.0 - 10.5 K/uL 5.3   8.6   Hemoglobin 13.0 - 17.0 g/dL 88.3  86.3  86.7   Hematocrit 39.0 - 52.0 % 35.7  40.0  40.5   Platelets 150 - 400 K/uL 78   128       Latest Ref Rng &  Units 10/13/2023   10:59 AM 07/28/2023    7:53 PM 07/28/2023    5:09 PM  CMP  Glucose 70 - 99 mg/dL 811   832   BUN 8 - 23 mg/dL 37   40   Creatinine 9.38 - 1.24 mg/dL 7.76   7.49   Sodium 864 - 145 mmol/L 140   141   Potassium 3.5 - 5.1 mmol/L 4.8  5.0  5.7   Chloride 98 - 111 mmol/L 106   112   CO2 22 - 32 mmol/L 22     Calcium  8.9 - 10.3 mg/dL 9.1     Total Protein 6.5 - 8.1 g/dL 6.4     Total Bilirubin 0.0 - 1.2 mg/dL 0.5     Alkaline Phos 38 - 126 U/L 107     AST 15 - 41 U/L 39     ALT 0 - 44 U/L 44       Latest Reference Range & Units 10/13/23 10:56  Iron 45 - 182 ug/dL 96  UIBC ug/dL 785  TIBC 749 - 549 ug/dL 690  Saturation Ratios 17.9 - 39.5 % 31  Ferritin 24 - 336 ng/mL 249   ASSESSMENT & PLAN:  Assessment/Plan:  A 73 y.o. male with anemia secondary to iron deficiency and chronic renal insufficiency.  When comparing his labs to what they were at his last visit, his hemoglobin is lower.  However, his iron parameters remain normal.  This patient's platelets  are also slightly lower than what they have been in the past.  Although none of his counts is dangerously low, he will be followed more closely over time to ensure there is not a progressive decline to where a bone marrow biopsy would need to be considered.  8 I will see this patient back in 6 months for repeat clinical assessment.  The patient understands all the plans discussed today and is in agreement with them.    Jazzmin Newbold DELENA Kerns, MD

## 2023-10-13 ENCOUNTER — Inpatient Hospital Stay: Payer: Medicare Other | Attending: Oncology | Admitting: Oncology

## 2023-10-13 ENCOUNTER — Inpatient Hospital Stay: Payer: Medicare Other

## 2023-10-13 ENCOUNTER — Telehealth: Payer: Self-pay

## 2023-10-13 ENCOUNTER — Telehealth: Payer: Self-pay | Admitting: Oncology

## 2023-10-13 ENCOUNTER — Other Ambulatory Visit: Payer: Self-pay | Admitting: Oncology

## 2023-10-13 VITALS — BP 158/60 | HR 77 | Temp 98.0°F | Resp 16 | Ht 71.0 in | Wt 221.4 lb

## 2023-10-13 DIAGNOSIS — N189 Chronic kidney disease, unspecified: Secondary | ICD-10-CM | POA: Diagnosis not present

## 2023-10-13 DIAGNOSIS — D509 Iron deficiency anemia, unspecified: Secondary | ICD-10-CM | POA: Diagnosis present

## 2023-10-13 DIAGNOSIS — D631 Anemia in chronic kidney disease: Secondary | ICD-10-CM

## 2023-10-13 LAB — CMP (CANCER CENTER ONLY)
ALT: 44 U/L (ref 0–44)
AST: 39 U/L (ref 15–41)
Albumin: 3.6 g/dL (ref 3.5–5.0)
Alkaline Phosphatase: 107 U/L (ref 38–126)
Anion gap: 12 (ref 5–15)
BUN: 37 mg/dL — ABNORMAL HIGH (ref 8–23)
CO2: 22 mmol/L (ref 22–32)
Calcium: 9.1 mg/dL (ref 8.9–10.3)
Chloride: 106 mmol/L (ref 98–111)
Creatinine: 2.23 mg/dL — ABNORMAL HIGH (ref 0.61–1.24)
GFR, Estimated: 31 mL/min — ABNORMAL LOW (ref >60)
Glucose, Bld: 188 mg/dL — ABNORMAL HIGH (ref 70–99)
Potassium: 4.8 mmol/L (ref 3.5–5.1)
Sodium: 140 mmol/L (ref 135–145)
Total Bilirubin: 0.5 mg/dL (ref 0.0–1.2)
Total Protein: 6.4 g/dL — ABNORMAL LOW (ref 6.5–8.1)

## 2023-10-13 LAB — IRON AND TIBC
Iron: 96 ug/dL (ref 45–182)
Saturation Ratios: 31 % (ref 17.9–39.5)
TIBC: 309 ug/dL (ref 250–450)
UIBC: 214 ug/dL

## 2023-10-13 LAB — CBC WITH DIFFERENTIAL (CANCER CENTER ONLY)
Abs Immature Granulocytes: 0.01 K/uL (ref 0.00–0.07)
Basophils Absolute: 0 K/uL (ref 0.0–0.1)
Basophils Relative: 0 %
Eosinophils Absolute: 0.1 K/uL (ref 0.0–0.5)
Eosinophils Relative: 1 %
HCT: 35.7 % — ABNORMAL LOW (ref 39.0–52.0)
Hemoglobin: 11.6 g/dL — ABNORMAL LOW (ref 13.0–17.0)
Immature Granulocytes: 0 %
Lymphocytes Relative: 14 %
Lymphs Abs: 0.7 K/uL (ref 0.7–4.0)
MCH: 31.4 pg (ref 26.0–34.0)
MCHC: 32.5 g/dL (ref 30.0–36.0)
MCV: 96.5 fL (ref 80.0–100.0)
Monocytes Absolute: 0.4 K/uL (ref 0.1–1.0)
Monocytes Relative: 7 %
Neutro Abs: 4.1 K/uL (ref 1.7–7.7)
Neutrophils Relative %: 78 %
Platelet Count: 78 K/uL — ABNORMAL LOW (ref 150–400)
RBC: 3.7 MIL/uL — ABNORMAL LOW (ref 4.22–5.81)
RDW: 13.4 % (ref 11.5–15.5)
WBC Count: 5.3 K/uL (ref 4.0–10.5)
nRBC: 0 % (ref 0.0–0.2)

## 2023-10-13 LAB — FERRITIN: Ferritin: 249 ng/mL (ref 24–336)

## 2023-10-13 NOTE — Telephone Encounter (Signed)
 Latest Reference Range & Units 10/13/23 10:56  Iron 45 - 182 ug/dL 96  UIBC ug/dL 785  TIBC 749 - 549 ug/dL 690  Saturation Ratios 17.9 - 39.5 % 31  Ferritin 24 - 336 ng/mL 249

## 2023-10-13 NOTE — Telephone Encounter (Signed)
 Patient has been scheduled for follow-up visit per 10/13/23 LOS.  Pt aware of scheduled appt details.

## 2023-10-15 ENCOUNTER — Encounter: Payer: Self-pay | Admitting: Oncology

## 2023-10-17 DIAGNOSIS — I10 Essential (primary) hypertension: Secondary | ICD-10-CM | POA: Diagnosis not present

## 2023-10-17 DIAGNOSIS — Z23 Encounter for immunization: Secondary | ICD-10-CM | POA: Diagnosis not present

## 2023-10-17 DIAGNOSIS — K219 Gastro-esophageal reflux disease without esophagitis: Secondary | ICD-10-CM | POA: Diagnosis not present

## 2023-10-17 DIAGNOSIS — E1169 Type 2 diabetes mellitus with other specified complication: Secondary | ICD-10-CM | POA: Diagnosis not present

## 2023-10-17 DIAGNOSIS — R112 Nausea with vomiting, unspecified: Secondary | ICD-10-CM | POA: Diagnosis not present

## 2023-10-19 DIAGNOSIS — Z961 Presence of intraocular lens: Secondary | ICD-10-CM | POA: Diagnosis not present

## 2023-10-19 DIAGNOSIS — H26491 Other secondary cataract, right eye: Secondary | ICD-10-CM | POA: Diagnosis not present

## 2023-10-19 DIAGNOSIS — H18413 Arcus senilis, bilateral: Secondary | ICD-10-CM | POA: Diagnosis not present

## 2023-10-19 DIAGNOSIS — E113293 Type 2 diabetes mellitus with mild nonproliferative diabetic retinopathy without macular edema, bilateral: Secondary | ICD-10-CM | POA: Diagnosis not present

## 2023-10-19 DIAGNOSIS — H35722 Serous detachment of retinal pigment epithelium, left eye: Secondary | ICD-10-CM | POA: Diagnosis not present

## 2023-11-01 DIAGNOSIS — Z940 Kidney transplant status: Secondary | ICD-10-CM | POA: Diagnosis not present

## 2024-01-26 ENCOUNTER — Ambulatory Visit: Admitting: Adult Health

## 2024-01-26 ENCOUNTER — Encounter: Payer: Self-pay | Admitting: Adult Health

## 2024-01-26 VITALS — BP 126/66 | HR 60 | Temp 97.8°F | Ht 71.0 in | Wt 210.8 lb

## 2024-01-26 DIAGNOSIS — Z87891 Personal history of nicotine dependence: Secondary | ICD-10-CM | POA: Diagnosis not present

## 2024-01-26 DIAGNOSIS — Z940 Kidney transplant status: Secondary | ICD-10-CM | POA: Diagnosis not present

## 2024-01-26 DIAGNOSIS — G4733 Obstructive sleep apnea (adult) (pediatric): Secondary | ICD-10-CM | POA: Diagnosis not present

## 2024-01-26 NOTE — Progress Notes (Signed)
 "  @Patient  ID: Jon [name], male    DOB: [birth year], 74 y.o.   MRN: [id number]  Chief Complaint  Patient presents with   Obstructive Sleep Apnea    F/u    Referring provider: Marvene Prentice SAUNDERS, FNP  HPI: 74 yo male followed for OSA Former patient of Dr. Neysa   Medical history significant for ESRD/Transplant, DM, CVA, Chronic back pain    TEST/EVENTS : Reviewed 01/26/2024  Unattended Home Sleep Test 10/24/12-AHI 24/hour, desaturation to 81%. PFT 12/28/16-mild restriction, mild reduction of diffusion.  No response to bronchodilator.  FVC 3.28/70%, FEV1 2.64/75%, ratio 0.81, TLC 77%, DLCO 66%. New CPAP 2024, DME [employer]   01/26/2024 Follow up ; OSA  Patient presents for a 1 year follow-up for sleep apnea.  Patient has moderate obstructive sleep apnea is on nocturnal CPAP.  Patient is a former patient of Dr. Saundra.  Patient says he is doing very well on CPAP.  Feels that he is rested and benefits from CPAP.  He wears a CPAP every single night.  Download shows 100% compliance.  Daily average usage at 10 hours.  Patient is on auto CPAP 5 to 20 cm H2O.  Daily average pressure at 15 cm H2O.  AHI 4.2/hour.  Currently uses a fullface mask.  Does feel occasionally that pressure is a little too high.   Allergies[1]  Immunization History  Administered Date(s) Administered   Fluad Quad(high Dose 65+) 11/24/2020   INFLUENZA, HIGH DOSE SEASONAL PF 12/17/2014, 09/10/2016, 09/13/2017, 10/08/2019   Influenza Whole 09/11/2018   Influenza-Unspecified 12/11/2014, 09/10/2021   Pneumococcal Polysaccharide-23 12/28/2012    Past Medical History:  Diagnosis Date   Abnormal gait    Acute blood loss anemia    Acute renal failure superimposed on stage 4 chronic kidney disease (HCC) 06/04/2017   Acute respiratory failure with hypoxia (HCC) 01/04/2019   Covid pneumonia Dec, 2020   Acute right ankle pain 12/08/2016   AKI (acute kidney injury) 12/27/2018   Anemia of chronic disease     Arteriovenous fistula for hemodialysis in place, primary 08/08/2017   Overview:  Left upper extremity  Formatting of this note might be different from the original. Left upper extremity   Arthritis    back, hands    Ascending aortic aneurysm 10/23/2017   3.9 cm by echocardiogram   Backache 01/27/2011   BK viremia 02/07/2014   Body mass index (BMI) 36.0-36.9, adult 01/16/2020   Cerebral infarction (HCC) 08/08/2017   Cerebrovascular accident (CVA) (HCC)    Cervical spondylosis 12/27/2012   Chronic back pain    radiculopathy and stenosis   Chronic ischemic heart disease    Severe LVH   Chronic kidney disease 2015   transplant   Colon polyp 2025   Congestive heart failure (CHF) (HCC) 2015   COVID-19 virus infection    CVA (cerebral infarction) 03/2013   Diabetes mellitus     Lantus  nightly ;type 2   Diabetes mellitus (HCC) 01/23/2011   Formatting of this note might be different from the original. Currently on Insulin    Digital mucinous cyst of finger of left hand 10/10/2016   Duodenal ulcer hemorrhagic    Dysphagia 08/22/2017   Dyspnea    Dyspnea on exertion 08/24/2016   PFT 12/28/16-mild restriction, mild reduction of diffusion.  No response to bronchodilator.  FVC 3.28/70%, FEV1 2.64/75%, ratio 0.81, TLC 77%, DLCO 66%.   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  Lexiscan  Myoview  (05/2013):  No ischemia, EF 53%; Normal Study   Hyperkalemia 01/23/2011   Hyperlipidemia    takes Lovastatin daily   Hypertension    takes Amlodipine  and Catapres  daily    dr Coleta, Loop Recorder - James Allred   Hypertension associated with transplantation 12/03/2014   Hypomagnesemia 10/25/2013   Immunosuppression 10/14/2013   Immunosuppressive management encounter following kidney transplant 10/21/2013   Insulin  dependent type 2 diabetes mellitus (HCC) 01/28/2014   Overview:  Currently on Insulin    Lumbago with sciatica, right side 12/17/2019   Lumbar post-laminectomy syndrome 05/13/2015   Mass of soft tissue of left upper extremity 10/18/2016   Melena    Movement disorder 03/16/2013   Nocturia    Obesity    OSA (obstructive sleep apnea) 05/02/2013   Unattended Home Sleep Test 10/24/12-AHI 24/hour, desaturation to 81%.   Osteoarthritis (arthritis due to wear and tear of joints) 08/08/2017   Other chronic pain 12/17/2019   Pain at surgical site 10/01/2013   Peripheral edema    takes Lasix  daily   Personal history of COVID-19    Pneumonia 2021   Pneumonia due to COVID-19 virus    Preop cardiovascular exam 03/22/2013   Pulmonary hypertension, unspecified (HCC) 01/23/2017   Scapholunate dissociation of left wrist 10/18/2016   Sleep apnea    cpap , study in their home, 09/2102- [employer]            Spinal stenosis 01/16/2020   Formatting of this note might be different from the original. Operated twice with sciatica with disectomy of lumbars   Stroke [facility]) 2015   speech, rt arm weakness- 04/07/20 - no residual    Swelling of lower extremity 06/30/2016   Thyroid  disease    Upper GI bleed 01/20/2019   URI, acute 03/07/2014   Urinary frequency     Tobacco History: Tobacco  Use History[2] Counseling given: Not Answered   Outpatient Medications Prior to Visit  Medication Sig Dispense Refill   amLODipine  (NORVASC ) 5 MG tablet Take 5 mg by mouth daily.     aspirin  81 MG EC tablet Take 81 mg by mouth daily. Swallow whole.     calcitRIOL  (ROCALTROL ) 0.25 MCG capsule Take 0.25 mcg by mouth every Monday, Tuesday, Wednesday, Thursday, and Friday.     cholecalciferol (VITAMIN D3) 25 MCG (1000 UNIT) tablet Take 1,000 Units by mouth daily.     colchicine 0.6 MG tablet Take 0.6 mg by mouth 2 (two) times daily.     dapagliflozin propanediol (FARXIGA) 10 MG TABS tablet Take 10 mg by mouth daily.     febuxostat (ULORIC) 40 MG tablet Take 1 tablet by mouth daily.  ferrous sulfate 325 (65 FE) MG EC tablet Take 325 mg by mouth daily with breakfast.     furosemide  (LASIX ) 40 MG tablet Take 80 mg by mouth in the morning. 1 every afternoon     HYDROcodone -acetaminophen  (NORCO) 10-325 MG tablet Take 1 tablet by mouth 4 (four) times daily as needed.     Insulin  Glargine (BASAGLAR  KWIKPEN) 100 UNIT/ML Inject 49 Units into the skin at bedtime.     insulin  lispro (HUMALOG KWIKPEN) 100 UNIT/ML KwikPen Inject 5 Units into the skin See admin instructions. Sliding scale over 150 add 2 units Three times a day     methimazole  (TAPAZOLE ) 5 MG tablet Take 2.5 mg by mouth daily.   3   MILK THISTLE EXTRACT PO Take 175 mg by mouth.     Multiple Vitamin (MULTIVITAMIN WITH MINERALS) TABS tablet Take 1 tablet by mouth daily. Unknown strength     Multiple Vitamins-Minerals (AIRBORNE GUMMIES) CHEW 1 gummy Orally twice daily     mycophenolate  (MYFORTIC ) 180 MG EC tablet Take 180 mg by mouth 2 (two) times daily.      ONETOUCH ULTRA test strip 1 each by Other route as needed for other (Glucose check).     pantoprazole  (PROTONIX ) 40 MG tablet Take 1 tablet (40 mg total) by mouth 2 (two) times daily before a meal. 60 tablet 0   predniSONE  (DELTASONE ) 5 MG tablet Take 1 tablet (5 mg total) by mouth daily  with breakfast.     [place] Wort 300 MG CAPS Take 300 mg by mouth 3 (three) times daily.     sulfamethoxazole -trimethoprim  (BACTRIM ) 400-80 MG tablet Take 1 tablet by mouth 3 (three) times a week.     tacrolimus  (PROGRAF ) 1 MG capsule Take 2 mg by mouth 2 (two) times daily.  5   tamsulosin  (FLOMAX ) 0.4 MG CAPS capsule Take 0.4 mg by mouth at bedtime.     vitamin B-12 (CYANOCOBALAMIN ) 1000 MCG tablet Take 1,000 mcg by mouth 2 (two) times daily.      vitamin C (ASCORBIC ACID) 500 MG tablet Take 500 mg by mouth daily.     Zinc  30 MG CAPS Take 30 mg by mouth 2 (two) times daily.     labetalol  (NORMODYNE ) 100 MG tablet Take 0.5 tablets (50 mg total) by mouth 2 (two) times daily. (Patient not taking: Reported on 01/26/2024) 90 tablet 3   ondansetron  (ZOFRAN ) 8 MG tablet Take 1 tablet (8 mg total) by mouth every 8 (eight) hours as needed for nausea or vomiting. (Patient not taking: Reported on 01/26/2024) 12 tablet 0   No facility-administered medications prior to visit.     Review of Systems:   Constitutional:   No  weight loss, night sweats,  Fevers, chills, fatigue, or  lassitude.  HEENT:   No headaches,  Difficulty swallowing,  Tooth/dental problems, or  Sore throat,                No sneezing, itching, ear ache, nasal congestion, post nasal drip,   CV:  No chest pain,  Orthopnea, PND, swelling in lower extremities, anasarca, dizziness, palpitations, syncope.   GI  No heartburn, indigestion, abdominal pain, nausea, vomiting, diarrhea, change in bowel habits, loss of appetite, bloody stools.   Resp: No shortness of breath with exertion or at rest.  No excess mucus, no productive cough,  No non-productive cough,  No coughing up of blood.  No change in color of mucus.  No wheezing.  No chest wall deformity  Skin: no rash or lesions.  GU: no dysuria, change in color of urine, no urgency or frequency.  No flank pain, no hematuria   MS:  No joint pain or swelling.  No decreased range of motion.   No back pain.    Physical Exam  BP (!) 161/54   Pulse 60   Temp 97.8 F (36.6 C)   Ht 5' 11 (1.803 m) Comment: Per pt  Wt 210 lb 12.8 oz (95.6 kg)   SpO2 98% Comment: RA  BMI 29.40 kg/m   GEN: A/Ox3; pleasant , NAD, well nourished    HEENT:  [place]/AT,   NOSE-clear, THROAT-clear, no lesions, no postnasal drip or exudate noted.   NECK:  Supple w/ fair ROM; no JVD; normal carotid impulses w/o bruits; no thyromegaly or nodules palpated; no lymphadenopathy.    RESP  Clear  P & A; w/o, wheezes/ rales/ or rhonchi. no accessory muscle use, no dullness to percussion  CARD:  RRR, no m/r/g, no peripheral edema, pulses intact, no cyanosis or clubbing.  GI:   Soft & nt; nml bowel sounds; no organomegaly or masses detected.   Musco: Warm bil, no deformities or joint swelling noted.   Neuro: alert, no focal deficits noted.    Skin: Warm, no lesions or rashes    Lab Results:Reviewed 01/26/2024   CBC  BNP  ProBNP No results found for: PROBNP  Imaging: No results found.  Administration History     None          No results found for: NITRICOXIDE     05/24/2016    9:00 AM  Results of the [facility] flowsheet  Sitting and reading 2  Watching TV 2  Sitting, inactive in a public place (e.g. a theatre or a meeting) 2  As a passenger in a car for an hour without a break 2  Lying down to rest in the afternoon when circumstances permit 1  Sitting and talking to someone 0  Sitting quietly after a lunch without alcohol 1  In a car, while stopped for a few minutes in traffic 0  Total score 10        Assessment & Plan:   Assessment and Plan Moderate obstructive sleep apnea with excellent control and compliance on nocturnal CPAP.  Has perceived benefit.  CPAP care discussed in detail.  Continue on CPAP at bedtime.  Will adjust CPAP pressure for comfort.  Changed to auto CPAP 5 to 15 cm H2O.  BMI 29-weight stable continue with healthy weight management  History of  end-stage renal disease status post renal transplant.  Reports that he is doing well.  Continue follow-up with transplant team  Plan  Patient Instructions  Continue on CPAP At bedtime Keep up good work Work on healthy weight  Do not drive if sleepy  Follow up in  1 year with Dr. Olena or Stephanine Reas NP and As needed           Madelin Stank, NP 01/26/2024     [1]  Allergies Allergen Reactions   Lisinopril Other (See Comments)    Increased potassium level    Meloxicam Nausea And Vomiting   Victoza [Liraglutide] Diarrhea, Nausea And Vomiting and Swelling   Cyclobenzaprine      Other reaction(s): Altered mental status  Other Reaction(s): Altered mental status  [2]  Social History Tobacco Use  Smoking Status Former   Current packs/day: 0.00   Average packs/day: 0.5 packs/day for 40.0 years (20.0 ttl pk-yrs)   Types:  Cigarettes   Start date: 12/27/1972   Quit date: 12/27/2012   Years since quitting: 11.0  Smokeless Tobacco Never   "

## 2024-01-26 NOTE — Patient Instructions (Signed)
 Continue on CPAP At bedtime Keep up good work Work on [employer]  Do not drive if sleepy  Follow up in  1 year with Dr. Olena or Jaramiah Bossard NP and As needed

## 2024-02-06 ENCOUNTER — Other Ambulatory Visit: Payer: Self-pay

## 2024-02-06 ENCOUNTER — Telehealth: Payer: Self-pay

## 2024-02-06 DIAGNOSIS — R7989 Other specified abnormal findings of blood chemistry: Secondary | ICD-10-CM

## 2024-02-06 DIAGNOSIS — R194 Change in bowel habit: Secondary | ICD-10-CM

## 2024-02-06 DIAGNOSIS — R634 Abnormal weight loss: Secondary | ICD-10-CM

## 2024-02-06 DIAGNOSIS — R630 Anorexia: Secondary | ICD-10-CM

## 2024-02-06 NOTE — Telephone Encounter (Signed)
-----   Message from [facility] J sent at 09/08/2023  4:43 PM EDT ----- Put in order for INR/PT, hepatic function, CBC, Bmet, RUQ abd U/S in FEB 2026

## 2024-02-09 ENCOUNTER — Ambulatory Visit: Admitting: Gastroenterology

## 2024-02-19 ENCOUNTER — Ambulatory Visit (HOSPITAL_COMMUNITY)

## 2024-02-21 ENCOUNTER — Ambulatory Visit: Admitting: Gastroenterology

## 2024-04-02 ENCOUNTER — Ambulatory Visit: Admitting: Cardiology

## 2024-04-12 ENCOUNTER — Other Ambulatory Visit

## 2024-04-12 ENCOUNTER — Ambulatory Visit: Admitting: Oncology
